# Patient Record
Sex: Female | Born: 1950
Health system: Southern US, Community
[De-identification: ages and names within clinical notes are randomized; demographics above are authoritative.]

## PROBLEM LIST (undated history)

## (undated) DIAGNOSIS — K219 Gastro-esophageal reflux disease without esophagitis: Secondary | ICD-10-CM

## (undated) DIAGNOSIS — T4145XA Adverse effect of unspecified anesthetic, initial encounter: Secondary | ICD-10-CM

## (undated) DIAGNOSIS — N183 Chronic kidney disease, stage 3 unspecified: Secondary | ICD-10-CM

## (undated) DIAGNOSIS — T8859XA Other complications of anesthesia, initial encounter: Secondary | ICD-10-CM

## (undated) DIAGNOSIS — M810 Age-related osteoporosis without current pathological fracture: Secondary | ICD-10-CM

## (undated) DIAGNOSIS — N3281 Overactive bladder: Secondary | ICD-10-CM

## (undated) DIAGNOSIS — E785 Hyperlipidemia, unspecified: Secondary | ICD-10-CM

## (undated) DIAGNOSIS — E119 Type 2 diabetes mellitus without complications: Secondary | ICD-10-CM

## (undated) DIAGNOSIS — Z85828 Personal history of other malignant neoplasm of skin: Secondary | ICD-10-CM

## (undated) DIAGNOSIS — M719 Bursopathy, unspecified: Secondary | ICD-10-CM

## (undated) DIAGNOSIS — E663 Overweight: Secondary | ICD-10-CM

## (undated) DIAGNOSIS — M1990 Unspecified osteoarthritis, unspecified site: Secondary | ICD-10-CM

## (undated) DIAGNOSIS — I10 Essential (primary) hypertension: Secondary | ICD-10-CM

## (undated) DIAGNOSIS — G4730 Sleep apnea, unspecified: Secondary | ICD-10-CM

## (undated) DIAGNOSIS — A0472 Enterocolitis due to Clostridium difficile, not specified as recurrent: Secondary | ICD-10-CM

## (undated) DIAGNOSIS — R600 Localized edema: Secondary | ICD-10-CM

## (undated) HISTORY — PX: UTERINE FIBROID SURGERY: SHX826

## (undated) HISTORY — PX: CATARACT EXTRACTION: SUR2

## (undated) HISTORY — DX: Enterocolitis due to Clostridium difficile, not specified as recurrent: A04.72

## (undated) HISTORY — DX: Sleep apnea, unspecified: G47.30

## (undated) HISTORY — DX: Overweight: E66.3

## (undated) HISTORY — PX: EYE SURGERY: SHX253

## (undated) HISTORY — DX: Localized edema: R60.0

## (undated) HISTORY — DX: Chronic kidney disease, stage 3 unspecified: N18.30

## (undated) HISTORY — DX: Type 2 diabetes mellitus without complications: E11.9

## (undated) HISTORY — PX: OTHER SURGICAL HISTORY: SHX169

---

## 1999-12-24 ENCOUNTER — Other Ambulatory Visit: Admission: RE | Admit: 1999-12-24 | Discharge: 1999-12-24 | Payer: Self-pay | Admitting: Obstetrics and Gynecology

## 2000-02-28 ENCOUNTER — Encounter: Payer: Self-pay | Admitting: Obstetrics and Gynecology

## 2000-02-28 ENCOUNTER — Encounter: Admission: RE | Admit: 2000-02-28 | Discharge: 2000-02-28 | Payer: Self-pay | Admitting: Internal Medicine

## 2001-01-12 ENCOUNTER — Other Ambulatory Visit: Admission: RE | Admit: 2001-01-12 | Discharge: 2001-01-12 | Payer: Self-pay | Admitting: Obstetrics and Gynecology

## 2001-02-28 ENCOUNTER — Encounter: Admission: RE | Admit: 2001-02-28 | Discharge: 2001-02-28 | Payer: Self-pay | Admitting: Obstetrics and Gynecology

## 2001-02-28 ENCOUNTER — Encounter: Payer: Self-pay | Admitting: Obstetrics and Gynecology

## 2002-03-06 ENCOUNTER — Encounter: Payer: Self-pay | Admitting: Obstetrics and Gynecology

## 2002-03-06 ENCOUNTER — Encounter: Admission: RE | Admit: 2002-03-06 | Discharge: 2002-03-06 | Payer: Self-pay | Admitting: Obstetrics and Gynecology

## 2003-03-03 ENCOUNTER — Encounter: Payer: Self-pay | Admitting: Obstetrics and Gynecology

## 2003-03-03 ENCOUNTER — Ambulatory Visit (HOSPITAL_COMMUNITY): Admission: RE | Admit: 2003-03-03 | Discharge: 2003-03-03 | Payer: Self-pay | Admitting: Obstetrics and Gynecology

## 2003-03-10 ENCOUNTER — Encounter: Admission: RE | Admit: 2003-03-10 | Discharge: 2003-03-10 | Payer: Self-pay | Admitting: Family Medicine

## 2003-03-10 ENCOUNTER — Encounter: Payer: Self-pay | Admitting: Family Medicine

## 2003-03-21 ENCOUNTER — Encounter: Payer: Self-pay | Admitting: Obstetrics and Gynecology

## 2003-03-27 ENCOUNTER — Inpatient Hospital Stay (HOSPITAL_COMMUNITY): Admission: RE | Admit: 2003-03-27 | Discharge: 2003-03-30 | Payer: Self-pay | Admitting: Obstetrics and Gynecology

## 2003-05-12 ENCOUNTER — Encounter: Admission: RE | Admit: 2003-05-12 | Discharge: 2003-05-12 | Payer: Self-pay | Admitting: Family Medicine

## 2003-05-12 ENCOUNTER — Encounter: Payer: Self-pay | Admitting: Obstetrics and Gynecology

## 2003-06-18 ENCOUNTER — Encounter: Admission: RE | Admit: 2003-06-18 | Discharge: 2003-06-18 | Payer: Self-pay | Admitting: Obstetrics and Gynecology

## 2003-08-02 HISTORY — PX: ABDOMINAL HYSTERECTOMY: SHX81

## 2004-03-11 ENCOUNTER — Encounter: Admission: RE | Admit: 2004-03-11 | Discharge: 2004-03-11 | Payer: Self-pay | Admitting: Internal Medicine

## 2004-10-16 ENCOUNTER — Emergency Department (HOSPITAL_COMMUNITY): Admission: EM | Admit: 2004-10-16 | Discharge: 2004-10-16 | Payer: Self-pay | Admitting: Emergency Medicine

## 2005-03-14 ENCOUNTER — Encounter: Admission: RE | Admit: 2005-03-14 | Discharge: 2005-03-14 | Payer: Self-pay | Admitting: Obstetrics and Gynecology

## 2005-03-16 ENCOUNTER — Encounter: Admission: RE | Admit: 2005-03-16 | Discharge: 2005-03-16 | Payer: Self-pay | Admitting: Family Medicine

## 2005-09-13 ENCOUNTER — Ambulatory Visit (HOSPITAL_BASED_OUTPATIENT_CLINIC_OR_DEPARTMENT_OTHER): Admission: RE | Admit: 2005-09-13 | Discharge: 2005-09-13 | Payer: Self-pay | Admitting: Orthopedic Surgery

## 2005-09-13 ENCOUNTER — Encounter (INDEPENDENT_AMBULATORY_CARE_PROVIDER_SITE_OTHER): Payer: Self-pay | Admitting: *Deleted

## 2005-11-18 ENCOUNTER — Encounter: Admission: RE | Admit: 2005-11-18 | Discharge: 2005-11-18 | Payer: Self-pay | Admitting: Family Medicine

## 2006-03-15 ENCOUNTER — Encounter: Admission: RE | Admit: 2006-03-15 | Discharge: 2006-03-15 | Payer: Self-pay | Admitting: Obstetrics and Gynecology

## 2007-03-19 ENCOUNTER — Encounter: Admission: RE | Admit: 2007-03-19 | Discharge: 2007-03-19 | Payer: Self-pay | Admitting: Obstetrics and Gynecology

## 2008-01-23 ENCOUNTER — Encounter (INDEPENDENT_AMBULATORY_CARE_PROVIDER_SITE_OTHER): Payer: Self-pay | Admitting: Urology

## 2008-01-23 ENCOUNTER — Ambulatory Visit (HOSPITAL_BASED_OUTPATIENT_CLINIC_OR_DEPARTMENT_OTHER): Admission: RE | Admit: 2008-01-23 | Discharge: 2008-01-23 | Payer: Self-pay | Admitting: Urology

## 2008-03-19 ENCOUNTER — Encounter: Admission: RE | Admit: 2008-03-19 | Discharge: 2008-03-19 | Payer: Self-pay | Admitting: Obstetrics and Gynecology

## 2009-03-23 ENCOUNTER — Encounter: Admission: RE | Admit: 2009-03-23 | Discharge: 2009-03-23 | Payer: Self-pay | Admitting: Obstetrics and Gynecology

## 2010-03-26 ENCOUNTER — Encounter: Admission: RE | Admit: 2010-03-26 | Discharge: 2010-03-26 | Payer: Self-pay | Admitting: Obstetrics and Gynecology

## 2010-08-21 ENCOUNTER — Encounter: Payer: Self-pay | Admitting: Obstetrics and Gynecology

## 2010-12-14 NOTE — Op Note (Signed)
Pamela Love, Pamela Love                 ACCOUNT NO.:  [id number]   MEDICAL RECORD NO.:  [id number]         PATIENT TYPE:  HAMB   LOCATION:                               FACILITY:  [facility]   PHYSICIAN:  Maretta Bees. Vonita Moss, M.D.DATE OF BIRTH:  1951/03/12   DATE OF PROCEDURE:  01/23/2008  DATE OF DISCHARGE:                               OPERATIVE REPORT   PREOPERATIVE DIAGNOSIS:  Rule out interstitial cystitis.   POSTOPERATIVE DIAGNOSIS:  Rule out interstitial cystitis.   PROCEDURES:  1. Cystoscopy.  2. Hydraulic overdistention of bladder.  3. Cold cup bladder biopsy.   SURGEON:  Maretta Bees. Vonita Moss, M.D.   ANESTHESIA:  General.   INDICATIONS:  This 60 year old lady has had problems with bladder  pressure and discomfort and some pain with filling of the bladder.  Pelvic pain and symptom score was 17 which gave her about a 75% chance  of having interstitial cystitis.  She was brought to the OR, today, for  further evaluation; having been advised about the risk of infection,  hemorrhage of bladder rupture.   DESCRIPTION OF PROCEDURE:  The patient was brought to the operating  room, placed in lithotomy position.  External genitalia were prepped and  draped in the usual fashion.  She was cystoscoped and the bladder was  totally normal.  She held up to 700 mL of irrigating fluid, and looking  back in she had scattered submucosal petechiae and minimal hemorrhage.  Three hemorrhagic areas on the bladder base were biopsied with the cold  cup bladder biopsy forceps, and then the biopsy sites were coagulated  with the Bugbee electrode.  There was no significant blood loss.  There  was good hemostasis, and at the end of the case of bladder was emptied;  and the patient sent to the recovery room in good the recovery room in  good condition.      Maretta Bees. Vonita Moss, M.D.  Electronically Signed     LJP/MEDQ  D:  01/23/2008  T:  01/23/2008  Job:  [id number]

## 2010-12-17 NOTE — Op Note (Signed)
NAME:  Pamela Love, Pamela Love                 ACCOUNT NO.:  [id number]   MEDICAL RECORD NO.:  [id number]          PATIENT TYPE:  AMB   LOCATION:  [facility]                          FACILITY:  [facility]   PHYSICIAN:  Katy Fitch. Sypher, M.D. DATE OF BIRTH:  31-Mar-1951   DATE OF PROCEDURE:  09/13/2005  DATE OF DISCHARGE:                                 OPERATIVE REPORT   PREOPERATIVE DIAGNOSIS:  Enlarging painful mass, dorsal aspect of left index  finger proximal interphalangeal joint, rule out extensor tendon ganglion  versus myxoma from proximal interphalangeal joint.   POSTOPERATIVE DIAGNOSIS:  Trans-extensor ganglion cyst emanating from ulnar  dorsal aspect of left index finger proximal interphalangeal joint.   OPERATION:  1.  Excisional biopsy of a ganglion cyst/myxoma from the dorsoulnar aspect      of the left index finger proximal interphalangeal joint.  2.  Arthrotomy of proximal interphalangeal joint for synovectomy and      debridement, irrigation and removal of root of extensor tendon      ganglion.   OPERATING SURGEON:  Katy Fitch. Sypher, M.D.   ASSISTANT:  Pamela Love, P.A.-C   ANESTHESIA:  2% lidocaine metacarpal head level block, left index finger.  There was no sedation provided; this procedure was performed in the minor  operating suite.   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  Arvilla Love.   She had pain and had a mass measuring approximately 6 mm in length and 5 mm  in width.  This was quite prominent with finger flexion.   X-ray of her finger demonstrated degenerative change at the DIP and PIP  joints of a minor degree.  She appeared to have an extensor ganglion/myxoma.   We recommended excisional biopsy at her convenience.   She requested proceeding with removal at this time.   Preoperatively, she was advised that these cysts can form when there is  underlying osteoarthritis.  We cannot guarantee that he will remain gone  after debridement, although our usual experiences is successful with  thorough debridement of the PIP joint.   After informed consent, she is brought to the operating room at this time.   PROCEDURE:  Pamela Love is brought to the minor operating room and placed in  supine position upon the operating table.  Following a Betadine prep, a 2%  lidocaine block was placed at the metacarpal head level to obtain a digital  block.  After 10 minutes, anesthesia was satisfactory.  The left hand and  arm were prepped with Betadine soap and solution and sterilely draped.   The left index finger was exsanguinated with a gauze wrap and a half-inch  Penrose drain used at the base of the finger as a digital tourniquet.   The procedure commenced with a curvilinear incision exposing the mass.  Subcutaneous tissues were carefully dividing, taking care to spare the  dorsal veins.  A 6 x 5-mm cyst  was identified exiting between the ulnar-  lateral band and the central slip.  This was circumferentially dissected and  followed through the extensor mechanism and an arthrotomy of the PIP joint  accomplished.  There was considerable synovitis in the PIP joint.  I could  not identify a discrete osteophyte adjacent to the origin of the cyst.  The  DIP joint was thoroughly debrided with a micro-rongeur of all visible  synovium followed by thorough irrigation with a dental gauge 18 needle.   The wound was then irrigated and bleeding points electrocauterized, followed  by repair of the skin with mattress suture of 5-0 nylon.   There were no apparent complications.   Pamela Love tolerated surgery and anesthesia well.  She was transferred to the  recovery room with stable signs.   She will be discharged home with a prescription for Darvocet-N 100 one p.o.  q.4-6 h. p.r.n.  pain, 16 tablets without refill.   She will return to see [place] in followup and 5-7 days for a dressing change.  We anticipate suture removal in approximately 10 days.      Katy Fitch Sypher, M.D.  Electronically Signed     RVS/MEDQ  D:  09/13/2005  T:  09/14/2005  Job:  [id number]

## 2011-02-18 ENCOUNTER — Other Ambulatory Visit: Payer: Self-pay | Admitting: Obstetrics and Gynecology

## 2011-02-18 DIAGNOSIS — Z1231 Encounter for screening mammogram for malignant neoplasm of breast: Secondary | ICD-10-CM

## 2011-03-30 ENCOUNTER — Ambulatory Visit
Admission: RE | Admit: 2011-03-30 | Discharge: 2011-03-30 | Disposition: A | Discharge status: Home / Self Care | Payer: BC Managed Care – PPO | Source: Ambulatory Visit | Attending: Obstetrics and Gynecology | Admitting: Obstetrics and Gynecology

## 2011-03-30 DIAGNOSIS — Z1231 Encounter for screening mammogram for malignant neoplasm of breast: Secondary | ICD-10-CM

## 2011-04-28 LAB — POCT I-STAT 4, (NA,K, GLUC, HGB,HCT)
Glucose, Bld: 110 — ABNORMAL HIGH
Operator id: 268271
Potassium: 3.9
Sodium: 139

## 2012-03-19 ENCOUNTER — Other Ambulatory Visit: Payer: Self-pay | Admitting: Obstetrics and Gynecology

## 2012-03-19 DIAGNOSIS — Z1231 Encounter for screening mammogram for malignant neoplasm of breast: Secondary | ICD-10-CM

## 2012-04-04 ENCOUNTER — Ambulatory Visit
Admission: RE | Admit: 2012-04-04 | Discharge: 2012-04-04 | Disposition: A | Discharge status: Home / Self Care | Payer: BC Managed Care – PPO | Source: Ambulatory Visit | Attending: Obstetrics and Gynecology | Admitting: Obstetrics and Gynecology

## 2012-04-04 DIAGNOSIS — Z1231 Encounter for screening mammogram for malignant neoplasm of breast: Secondary | ICD-10-CM

## 2012-04-11 ENCOUNTER — Other Ambulatory Visit: Payer: Self-pay | Admitting: Orthopedic Surgery

## 2012-04-11 DIAGNOSIS — M25561 Pain in right knee: Secondary | ICD-10-CM

## 2012-04-11 DIAGNOSIS — R531 Weakness: Secondary | ICD-10-CM

## 2012-04-15 ENCOUNTER — Ambulatory Visit
Admission: RE | Admit: 2012-04-15 | Discharge: 2012-04-15 | Disposition: A | Discharge status: Home / Self Care | Payer: BC Managed Care – PPO | Source: Ambulatory Visit | Attending: Orthopedic Surgery | Admitting: Orthopedic Surgery

## 2012-04-15 DIAGNOSIS — M25561 Pain in right knee: Secondary | ICD-10-CM

## 2012-04-15 DIAGNOSIS — R531 Weakness: Secondary | ICD-10-CM

## 2012-08-22 ENCOUNTER — Other Ambulatory Visit: Payer: Self-pay | Admitting: Dermatology

## 2013-03-25 ENCOUNTER — Other Ambulatory Visit: Payer: Self-pay

## 2013-03-25 DIAGNOSIS — Z1231 Encounter for screening mammogram for malignant neoplasm of breast: Secondary | ICD-10-CM

## 2013-04-24 ENCOUNTER — Ambulatory Visit
Admission: RE | Admit: 2013-04-24 | Discharge: 2013-04-24 | Disposition: A | Discharge status: Home / Self Care | Payer: BC Managed Care – PPO | Source: Ambulatory Visit

## 2013-04-24 DIAGNOSIS — Z1231 Encounter for screening mammogram for malignant neoplasm of breast: Secondary | ICD-10-CM

## 2014-03-27 ENCOUNTER — Other Ambulatory Visit: Payer: Self-pay

## 2014-03-27 DIAGNOSIS — Z1231 Encounter for screening mammogram for malignant neoplasm of breast: Secondary | ICD-10-CM

## 2014-04-25 ENCOUNTER — Ambulatory Visit: Payer: BC Managed Care – PPO

## 2014-04-29 ENCOUNTER — Ambulatory Visit
Admission: RE | Admit: 2014-04-29 | Discharge: 2014-04-29 | Disposition: A | Discharge status: Home / Self Care | Payer: 59 | Source: Ambulatory Visit

## 2014-04-29 DIAGNOSIS — Z1231 Encounter for screening mammogram for malignant neoplasm of breast: Secondary | ICD-10-CM

## 2014-09-24 ENCOUNTER — Other Ambulatory Visit: Payer: Self-pay | Admitting: Orthopedic Surgery

## 2014-09-24 NOTE — H&P (Signed)
TOTAL KNEE ADMISSION H&P  Patient is being admitted for left total knee arthroplasty.  Subjective:  Chief Complaint:left knee pain.  HPI: Pamela Love, 64 y.o. female, has a history of pain and functional disability in the left knee due to arthritis and has failed non-surgical conservative treatments for greater than 12 weeks to includeNSAID's and/or analgesics, corticosteriod injections, viscosupplementation injections, weight reduction as appropriate and activity modification.  Onset of symptoms was gradual, starting 4 years ago with gradually worsening course since that time. The patient noted no past surgery on the left knee(s).  Patient currently rates pain in the left knee(s) at 7 out of 10 with activity. Patient has night pain, worsening of pain with activity and weight bearing, pain that interferes with activities of daily living, pain with passive range of motion and joint swelling.  Patient has evidence of subchondral sclerosis and joint space narrowing by imaging studies. This patient has had Osteoarthritis. There is no active infection.  There are no active problems to display for this patient.  No past medical history on file.  No past surgical history on file.  No prescriptions prior to admission   Allergies not on file  History  Substance Use Topics  . Smoking status: Not on file  . Smokeless tobacco: Not on file  . Alcohol Use: Not on file    No family history on file.   Review of Systems  Constitutional: Negative.   HENT: Negative.   Eyes: Negative.   Respiratory: Negative.   Cardiovascular: Negative.   Gastrointestinal: Negative.   Genitourinary: Negative.   Musculoskeletal: Positive for joint pain.  Skin: Negative.   Neurological: Negative.   Endo/Heme/Allergies: Negative.   Psychiatric/Behavioral: Negative.     Objective:  Physical Exam  Constitutional: She is oriented to person, place, and time. She appears well-developed.  HENT:  Head: Atraumatic.   Eyes: EOM are normal.  Neck: Normal range of motion.  Cardiovascular: Normal rate, normal heart sounds and intact distal pulses.   Respiratory: Effort normal and breath sounds normal.  GI: Soft.  Genitourinary:  Deferred  Musculoskeletal:  Pain with stand and ROM of bilateral knees. R&L lower extremities n/v intact.   Neurological: She is alert and oriented to person, place, and time. She has normal reflexes.  Skin: Skin is warm and dry.  Psychiatric: Her behavior is normal.    Vital signs in last 24 hours:    Labs:   There is no height or weight on file to calculate BMI.   Imaging Review Plain radiographs demonstrate moderate degenerative joint disease of the left knee(s). The overall alignment ismild valgus. The bone quality appears to be good for age and reported activity level.  Assessment/Plan:  End stage arthritis, left knee   The patient history, physical examination, clinical judgment of the provider and imaging studies are consistent with end stage degenerative joint disease of the left knee(s) and total knee arthroplasty is deemed medically necessary. The treatment options including medical management, injection therapy arthroscopy and arthroplasty were discussed at length. The risks and benefits of total knee arthroplasty were presented and reviewed. The risks due to aseptic loosening, infection, stiffness, patella tracking problems, thromboembolic complications and other imponderables were discussed. The patient acknowledged the explanation, agreed to proceed with the plan and consent was signed. Patient is being admitted for inpatient treatment for surgery, pain control, PT, OT, prophylactic antibiotics, VTE prophylaxis, progressive ambulation and ADL's and discharge planning. The patient is planning to be discharged home with home health  services.  Contraindications and adverse affects of Tranexamic acid discussed in detail. Patient denies any of these at this time and  understands the risks and benefits.

## 2014-10-09 NOTE — Patient Instructions (Addendum)
Gweneth DimitriLynda D Slivinski  10/09/2014   Your procedure is scheduled on: 10/16/14   [facility] and follow signs to               Short Stay Center at 12:00 PM.   Call this number if you have problems the morning of surgery [phone]   Remember:  Do not eat food  :After Midnight.             MAY HAVE CLEAR LIQUIDS UNTIL 8:00 AM    CLEAR LIQUID DIET   Foods Allowed                                                                     Foods Excluded  Coffee and tea, regular and decaf                             liquids that you cannot  Plain Jell-O in any flavor                                             see through such as: Fruit ices (not with fruit pulp)                                     milk, soups, orange juice  Iced Popsicles                                                All solid food Carbonated beverages, regular and diet                                    Cranberry, grape and apple juices Sports drinks like Gatorade Lightly seasoned clear broth or consume(fat free) Sugar, honey syrup  _____________________________________________________________________    Take these medicines the morning of surgery with A SIP OF WATER: PRILOSEC / CRESTOR / TROSPIUM                               You may not have any metal on your body including hair pins and              piercings  Do not wear jewelry, make-up, lotions, powders or perfumes.             Do not wear nail polish.  Do not shave  48 hours prior to surgery.              Men may shave face and neck.   Do not bring valuables to the hospital. [facility] IS NOT             RESPONSIBLE   FOR VALUABLES.  Contacts, dentures or bridgework may not be worn into surgery.  Leave suitcase in the car. After surgery it may be brought to your room.     Patients discharged the day of surgery will not be allowed to drive home.  Name and phone number of your driver:  Special Instructions:  N/A              Please read over the following fact sheets you were given: _____________________________________________________________________                                                     [place]  Before surgery, you can play an important role.  Because skin is not sterile, your skin needs to be as free of germs as possible.  You can reduce the number of germs on your skin by washing with CHG (chlorahexidine gluconate) soap before surgery.  CHG is an antiseptic cleaner which kills germs and bonds with the skin to continue killing germs even after washing. Please DO NOT use if you have an allergy to CHG or antibacterial soaps.  If your skin becomes reddened/irritated stop using the CHG and inform your nurse when you arrive at Short Stay. Do not shave (including legs and underarms) for at least 48 hours prior to the first CHG shower.  You may shave your face. Please follow these instructions carefully:   1.  Shower with CHG Soap the night before surgery and the  morning of Surgery.   2.  If you choose to wash your hair, wash your hair first as usual with your  normal  Shampoo.   3.  After you shampoo, rinse your hair and body thoroughly to remove the  shampoo.                                         4.  Use CHG as you would any other liquid soap.  You can apply chg directly  to the skin and wash . Gently wash with scrungie or clean wascloth    5.  Apply the CHG Soap to your body ONLY FROM THE NECK DOWN.   Do not use on open                           Wound or open sores. Avoid contact with eyes, ears mouth and genitals (private parts).                        Genitals (private parts) with your normal soap.              6.  Wash thoroughly, paying special attention to the area where your surgery  will be performed.   7.  Thoroughly rinse your body with warm water from the neck down.   8.  DO NOT shower/wash with your normal soap after using and rinsing  off  the CHG Soap .                9.  Pat yourself dry with a clean towel.             10.  Wear clean pajamas.  11.  Place clean sheets on your bed the night of your first shower and do not  sleep with pets.  Day of Surgery : Do not apply any lotions/deodorants the morning of surgery.  Please wear clean clothes to the hospital/surgery center.  FAILURE TO FOLLOW THESE INSTRUCTIONS MAY RESULT IN THE CANCELLATION OF YOUR SURGERY    PATIENT SIGNATURE_________________________________  ______________________________________________________________________     Adam [place]ix  An incentive spirometer is a tool that can help keep your lungs clear and active. This tool measures how well you are filling your lungs with each breath. Taking long deep breaths may help reverse or decrease the chance of developing breathing (pulmonary) problems (especially infection) following:  A long period of time when you are unable to move or be active. BEFORE THE PROCEDURE   If the spirometer includes an indicator to show your best effort, your nurse or respiratory therapist will set it to a desired goal.  If possible, sit up straight or lean slightly forward. Try not to slouch.  Hold the incentive spirometer in an upright position. INSTRUCTIONS FOR USE  1. Sit on the edge of your bed if possible, or sit up as far as you can in bed or on a chair. 2. Hold the incentive spirometer in an upright position. 3. Breathe out normally. 4. Place the mouthpiece in your mouth and seal your lips tightly around it. 5. Breathe in slowly and as deeply as possible, raising the piston or the ball toward the top of the column. 6. Hold your breath for 3-5 seconds or for as long as possible. Allow the piston or ball to fall to the bottom of the column. 7. Remove the mouthpiece from your mouth and breathe out normally. 8. Rest for a few seconds and repeat Steps 1 through 7 at least 10 times every 1-2  hours when you are awake. Take your time and take a few normal breaths between deep breaths. 9. The spirometer may include an indicator to show your best effort. Use the indicator as a goal to work toward during each repetition. 10. After each set of 10 deep breaths, practice coughing to be sure your lungs are clear. If you have an incision (the cut made at the time of surgery), support your incision when coughing by placing a pillow or rolled up towels firmly against it. Once you are able to get out of bed, walk around indoors and cough well. You may stop using the incentive spirometer when instructed by your caregiver.  RISKS AND COMPLICATIONS  Take your time so you do not get dizzy or light-headed.  If you are in pain, you may need to take or ask for pain medication before doing incentive spirometry. It is harder to take a deep breath if you are having pain. AFTER USE  Rest and breathe slowly and easily.  It can be helpful to keep track of a log of your progress. Your caregiver can provide you with a simple table to help with this. If you are using the spirometer at home, follow these instructions: [place] IF:   You are having difficultly using the spirometer.  You have trouble using the spirometer as often as instructed.  Your pain medication is not giving enough relief while using the spirometer.  You develop fever of 100.5 F (38.1 C) or higher. SEEK IMMEDIATE MEDICAL CARE IF:   You cough up bloody sputum that had not been present before.  You develop fever of 102 F (  38.9 C) or greater.  You develop worsening pain at or near the incision site. MAKE SURE YOU:   Understand these instructions.  Will watch your condition.  Will get help right away if you are not doing well or get worse. Document Released: 11/28/2006 Document Revised: 10/10/2011 Document Reviewed: 01/29/2007 [facility] Patient Information 2014 [place],  [place].   ________________________________________________________________________

## 2014-10-10 ENCOUNTER — Other Ambulatory Visit (HOSPITAL_COMMUNITY): Payer: Self-pay | Admitting: *Deleted

## 2014-10-10 ENCOUNTER — Encounter (HOSPITAL_COMMUNITY)
Admission: RE | Admit: 2014-10-10 | Discharge: 2014-10-10 | Disposition: A | Discharge status: Home / Self Care | Payer: Medicare Other | Source: Ambulatory Visit | Attending: Specialist | Admitting: Specialist

## 2014-10-10 ENCOUNTER — Encounter (HOSPITAL_COMMUNITY): Payer: Self-pay

## 2014-10-10 DIAGNOSIS — M1712 Unilateral primary osteoarthritis, left knee: Secondary | ICD-10-CM | POA: Diagnosis not present

## 2014-10-10 DIAGNOSIS — Z01812 Encounter for preprocedural laboratory examination: Secondary | ICD-10-CM | POA: Insufficient documentation

## 2014-10-10 DIAGNOSIS — Z01810 Encounter for preprocedural cardiovascular examination: Secondary | ICD-10-CM | POA: Diagnosis not present

## 2014-10-10 HISTORY — DX: Overactive bladder: N32.81

## 2014-10-10 HISTORY — DX: Other complications of anesthesia, initial encounter: T88.59XA

## 2014-10-10 HISTORY — DX: Age-related osteoporosis without current pathological fracture: M81.0

## 2014-10-10 HISTORY — DX: Bursopathy, unspecified: M71.9

## 2014-10-10 HISTORY — DX: Adverse effect of unspecified anesthetic, initial encounter: T41.45XA

## 2014-10-10 HISTORY — DX: Hyperlipidemia, unspecified: E78.5

## 2014-10-10 HISTORY — DX: Essential (primary) hypertension: I10

## 2014-10-10 HISTORY — DX: Unspecified osteoarthritis, unspecified site: M19.90

## 2014-10-10 HISTORY — DX: Personal history of other malignant neoplasm of skin: Z85.828

## 2014-10-10 HISTORY — DX: Type 2 diabetes mellitus without complications: E11.9

## 2014-10-10 HISTORY — DX: Gastro-esophageal reflux disease without esophagitis: K21.9

## 2014-10-10 LAB — URINALYSIS, ROUTINE W REFLEX MICROSCOPIC
BILIRUBIN URINE: NEGATIVE
GLUCOSE, UA: 100 mg/dL — AB
HGB URINE DIPSTICK: NEGATIVE
Ketones, ur: NEGATIVE mg/dL
Leukocytes, UA: NEGATIVE
Nitrite: NEGATIVE
PROTEIN: NEGATIVE mg/dL
Specific Gravity, Urine: 1.016 (ref 1.005–1.030)
Urobilinogen, UA: 0.2 mg/dL (ref 0.0–1.0)
pH: 7 (ref 5.0–8.0)

## 2014-10-10 LAB — BASIC METABOLIC PANEL
Anion gap: 11 (ref 5–15)
BUN: 32 mg/dL — ABNORMAL HIGH (ref 6–23)
CO2: 26 mmol/L (ref 19–32)
Calcium: 10.3 mg/dL (ref 8.4–10.5)
Chloride: 103 mmol/L (ref 96–112)
Creatinine, Ser: 1.07 mg/dL (ref 0.50–1.10)
GFR calc Af Amer: 61 mL/min — ABNORMAL LOW (ref >90)
GFR calc non Af Amer: 53 mL/min — ABNORMAL LOW (ref >90)
Glucose, Bld: 173 mg/dL — ABNORMAL HIGH (ref 70–99)
Potassium: 3.4 mmol/L — ABNORMAL LOW (ref 3.5–5.1)
Sodium: 140 mmol/L (ref 135–145)

## 2014-10-10 LAB — APTT: aPTT: 29 seconds (ref 24–37)

## 2014-10-10 LAB — CBC
HCT: 41 % (ref 36.0–46.0)
Hemoglobin: 13.8 g/dL (ref 12.0–15.0)
MCH: 29.6 pg (ref 26.0–34.0)
MCHC: 33.7 g/dL (ref 30.0–36.0)
MCV: 87.8 fL (ref 78.0–100.0)
Platelets: 278 10*3/uL (ref 150–400)
RBC: 4.67 MIL/uL (ref 3.87–5.11)
RDW: 12.5 % (ref 11.5–15.5)
WBC: 7.9 10*3/uL (ref 4.0–10.5)

## 2014-10-10 LAB — SURGICAL PCR SCREEN
MRSA, PCR: NEGATIVE
Staphylococcus aureus: POSITIVE — AB

## 2014-10-10 LAB — PROTIME-INR
INR: 1 (ref 0.00–1.49)
Prothrombin Time: 13.3 seconds (ref 11.6–15.2)

## 2014-10-10 LAB — ABO/RH: ABO/RH(D): B POS

## 2014-10-13 NOTE — Progress Notes (Signed)
MRSA screening positive for Staph. Mupriocin ointment called into [facility] on [address] and patient called about treatment plan.  No questions or concerns. Faxed results to Dr. Thomasena Edisollins via [facility]

## 2014-10-16 ENCOUNTER — Inpatient Hospital Stay (HOSPITAL_COMMUNITY): Admission: RE | Admit: 2014-10-16 | Payer: Medicare Other | Source: Ambulatory Visit | Admitting: Specialist

## 2014-10-16 ENCOUNTER — Encounter (HOSPITAL_COMMUNITY): Admission: RE | Payer: Self-pay | Source: Ambulatory Visit

## 2014-10-16 LAB — TYPE AND SCREEN
ABO/RH(D): B POS
Antibody Screen: NEGATIVE

## 2014-10-16 SURGERY — ARTHROPLASTY, KNEE, TOTAL
Anesthesia: Spinal | Site: Knee | Laterality: Left

## 2014-10-20 ENCOUNTER — Encounter: Payer: Self-pay | Admitting: Endocrinology

## 2014-10-20 ENCOUNTER — Ambulatory Visit (INDEPENDENT_AMBULATORY_CARE_PROVIDER_SITE_OTHER): Payer: Medicare Other | Admitting: Endocrinology

## 2014-10-20 VITALS — BP 124/88 | HR 84 | Temp 97.9°F | Ht 63.0 in | Wt 202.0 lb

## 2014-10-20 DIAGNOSIS — E785 Hyperlipidemia, unspecified: Secondary | ICD-10-CM

## 2014-10-20 DIAGNOSIS — M179 Osteoarthritis of knee, unspecified: Secondary | ICD-10-CM

## 2014-10-20 DIAGNOSIS — I10 Essential (primary) hypertension: Secondary | ICD-10-CM

## 2014-10-20 DIAGNOSIS — M1710 Unilateral primary osteoarthritis, unspecified knee: Secondary | ICD-10-CM

## 2014-10-20 DIAGNOSIS — IMO0002 Reserved for concepts with insufficient information to code with codable children: Secondary | ICD-10-CM

## 2014-10-20 DIAGNOSIS — E1121 Type 2 diabetes mellitus with diabetic nephropathy: Secondary | ICD-10-CM

## 2014-10-20 MED ORDER — INSULIN GLARGINE 100 UNIT/ML SOLOSTAR PEN: 20.0000 [IU] | PEN_INJECTOR | SUBCUTANEOUS | Status: DC

## 2014-10-20 NOTE — Patient Instructions (Addendum)
good diet and exercise habits significanly improve the control of your diabetes.  please let me know if you wish to be referred to a dietician.  high blood sugar is very risky to your health.  you should see an eye doctor and dentist every year.  It is very important to get all recommended vaccinations.  controlling your blood pressure and cholesterol drastically reduces the damage diabetes does to your body.  Those who smoke should quit.  please discuss these with your doctor.  check your blood sugar twice a day.  vary the time of day when you check, between before the 3 meals, and at bedtime.  also check if you have symptoms of your blood sugar being too high or too low.  please keep a record of the readings and bring it to your next appointment here.  You can write it on any piece of paper.  please call us sooner if your blood sugar goes below 70, or if you have a lot of readings over 200.  Please change glimepiride to lantus, 20 units daily. Please call thurs morning, to tell us how the blood sugar is doing.  Please come back for a follow-up appointment in 2 weeks.  Then, we can do a blood test called "fructosamine."  This is like the a1c, but is shorter-term.   After your surgeries, we can probably go back to diabetes pills.

## 2014-10-20 NOTE — Progress Notes (Signed)
Subjective:    Patient ID: Pamela Love, female    DOB: 12/15/1950, 64 y.o.   MRN: [id number]  HPI History is from pt and husband. pt states DM was dx'ed in 2012; she has mild if any neuropathy of the lower extremities, but she has associated nephropathy; she has never been on insulin; pt says her diet is improved recently; she has never had GDM, pancreatitis, severe hypoglycemia or DKA.  a1c worsened recently, so TKR was delayed.  Pt says this worsening is due to inactivity, due to arthralgias.  She did not tolerate janumet or trulicity.  She takes only glimepiride x the past 3 weeks.  She says cbg's are in the mid-100's.   Past Medical History  Diagnosis Date  . Complication of anesthesia     "spinal did not work with second c section"  . Hypertension   . Hyperlipidemia   . Shortness of breath dyspnea     with exertion  . Arthritis   . Bursitis     hips  . Osteoporosis   . History of nonmelanoma skin cancer   . GERD (gastroesophageal reflux disease)   . History of nephritis     as child  . OAB (overactive bladder)   . Diabetes mellitus without complication     diet controlled - has Rx for Glymipride but has not started taking yet    Past Surgical History  Procedure Laterality Date  . Cesarean section      x2  . Uterine fibroid surgery    . Abdominal hysterectomy  2005    History   Social History  . Marital Status: Married    Spouse Name: N/A  . Number of Children: N/A  . Years of Education: N/A   Occupational History  . Not on file.   Social History Main Topics  . Smoking status: Never Smoker   . Smokeless tobacco: Not on file  . Alcohol Use: Yes     Comment: rare  . Drug Use: No  . Sexual Activity: Not on file   Other Topics Concern  . Not on file   Social History Narrative    Current Outpatient Prescriptions on File Prior to Visit  Medication Sig Dispense Refill  . Calcium-Vitamin D (CALTRATE 600 PLUS-VIT D PO) Take 1 tablet by mouth every other  day.    . Cholecalciferol (VITAMIN D) 2000 UNITS CAPS Take 2,000 Units by mouth daily.    . Coenzyme Q10 (COQ10) 200 MG CAPS Take 200 mg by mouth daily.    . diclofenac sodium (VOLTAREN) 1 % GEL Apply 1 application topically at bedtime. Applied knees    . DULoxetine (CYMBALTA) 20 MG capsule Take 40 mg by mouth at bedtime.     Marland Kitchen. HYDROcodone-acetaminophen (NORCO/VICODIN) 5-325 MG per tablet Take 1 tablet by mouth at bedtime.    . Misc Natural Products (GLUCOSAMINE CHONDROITIN ADV PO) Take 1 tablet by mouth 2 (two) times daily.    . Multiple Vitamin (MULTIVITAMIN WITH MINERALS) TABS tablet Take 1 tablet by mouth every other day.    . Omega-3 Fatty Acids (RA FISH OIL) 1400 MG CPDR Take 1,400 mg by mouth 2 (two) times daily.    Marland Kitchen. omeprazole (PRILOSEC) 20 MG capsule Take 20 mg by mouth daily.    . polyethylene glycol (MIRALAX / GLYCOLAX) packet Take 17 g by mouth daily as needed for mild constipation.    . rosuvastatin (CRESTOR) 10 MG tablet Take 10 mg by mouth daily.    .Marland Kitchen  trospium (SANCTURA) 20 MG tablet Take 20 mg by mouth 2 (two) times daily.  3  . valsartan-hydrochlorothiazide (DIOVAN-HCT) 320-25 MG per tablet Take 1 tablet by mouth daily.     No current facility-administered medications on file prior to visit.    Allergies  Allergen Reactions  . Colchicine     Stomach cramps  . Diclofenac     Oral causes stomach cramps  . Janumet [Sitagliptin-Metformin Hcl] Nausea And Vomiting    Lightheaded   . Septra [Sulfamethoxazole-Trimethoprim] Itching  . Trulicity [Dulaglutide] Diarrhea and Nausea And Vomiting  . Zetia [Ezetimibe]     Muscle pain  . Imipramine Rash  . Tape Rash    Paper tape ok to use     Family History  Problem Relation Age of Onset  . Diabetes Maternal Grandmother   . Diabetes Maternal Grandfather     BP 124/88 mmHg  Pulse 84  Temp(Src) 97.9 F (36.6 C)  Ht  (1.6 m)  Wt 202 lb (91.627 kg)  BMI 35.79 kg/m2   Review of Systems denies fever, blurry vision,  headache, chest pain, n/v, urinary frequency, muscle cramps, excessive diaphoresis, depression, cold intolerance, rhinorrhea, and easy bruising.  She has doe.     Objective:   Physical Exam VS: see vs Love GEN: no distress.  Morbid obesity. HEAD: head: no deformity eyes: no periorbital swelling, no proptosis external nose and ears are normal mouth: no lesion seen NECK: supple, thyroid is not enlarged CHEST WALL: no deformity LUNGS:  Clear to auscultation CV: reg rate and rhythm, no murmur ABD: abdomen is soft, nontender.  no hepatosplenomegaly.  not distended.  no hernia MUSCULOSKELETAL: muscle bulk and strength are grossly normal.  no obvious joint swelling.  gait is steady with a cane EXTEMITIES: no deformity.  no ulcer on the feet.  feet are of normal color and temp.  no edema PULSES: dorsalis pedis intact bilat.  no carotid bruit NEURO:  cn 2-12 grossly intact.   readily moves all 4's.  sensation is intact to touch on the feet SKIN:  Normal texture and temperature.  No rash or suspicious lesion is visible.   NODES:  None palpable at the neck.  PSYCH: alert, well-oriented.  Does not appear anxious nor depressed.   outside test results are reviewed: A1c=9.0%  i personally reviewed electrocardiogram tracing: Possible Left atrial enlargement.      Assessment & Plan:  DM: severe ecacerbation OA: new to me: this is limiting exercise options, which is in turn exacerbating hyperglycemia.  We agreed that the next best step is to get to surgery.   Patient is advised the following: Patient Instructions  good diet and exercise habits significanly improve the control of your diabetes.  please let me know if you wish to be referred to a dietician.  high blood sugar is very risky to your health.  you should see an eye doctor and dentist every year.  It is very important to get all recommended vaccinations.  controlling your blood pressure and cholesterol drastically reduces the damage  diabetes does to your body.  Those who smoke should quit.  please discuss these with your doctor.  check your blood sugar twice a day.  vary the time of day when you check, between before the 3 meals, and at bedtime.  also check if you have symptoms of your blood sugar being too high or too low.  please keep a record of the readings and bring it to your next appointment  here.  You can write it on any piece of paper.  please call us sooner if your blood sugar goes below 70, or if you have a lot of readings over 200.  Please change glimepiride to lantus, 20 units daily. Please call thurs morning, to tell us how the blood sugar is doing.  Please come back for a follow-up appointment in 2 weeks.  Then, we can do a blood test called "fructosamine."  This is like the a1c, but is shorter-term.   After your surgeries, we can probably go back to diabetes pills.

## 2014-10-21 DIAGNOSIS — I10 Essential (primary) hypertension: Secondary | ICD-10-CM | POA: Insufficient documentation

## 2014-10-21 DIAGNOSIS — IMO0002 Reserved for concepts with insufficient information to code with codable children: Secondary | ICD-10-CM | POA: Insufficient documentation

## 2014-10-21 DIAGNOSIS — E785 Hyperlipidemia, unspecified: Secondary | ICD-10-CM

## 2014-10-21 DIAGNOSIS — M1710 Unilateral primary osteoarthritis, unspecified knee: Secondary | ICD-10-CM | POA: Insufficient documentation

## 2014-10-21 HISTORY — DX: Essential (primary) hypertension: I10

## 2014-10-21 HISTORY — DX: Reserved for concepts with insufficient information to code with codable children: IMO0002

## 2014-10-21 HISTORY — DX: Hyperlipidemia, unspecified: E78.5

## 2014-10-23 ENCOUNTER — Telehealth: Payer: Self-pay | Admitting: Endocrinology

## 2014-10-23 NOTE — Telephone Encounter (Signed)
Pt advised of note below and voiced understanding.  

## 2014-10-23 NOTE — Telephone Encounter (Signed)
Patient called stating that she was to call in her blood sugars  3.22.16  170 before supper 160 before bed  3.23.16 177 before breakfast 128 before supper  3.24.16 177 this morning   Please advise   Thank You

## 2014-10-23 NOTE — Telephone Encounter (Signed)
See note below and please advise, Thanks! 

## 2014-10-23 NOTE — Telephone Encounter (Signed)
please call patient: Please continue the same insulin. i'll see you next time.    

## 2014-10-27 ENCOUNTER — Telehealth: Payer: Self-pay

## 2014-10-27 NOTE — Telephone Encounter (Signed)
Unable to reach pt. Will try again at a later time.  

## 2014-10-27 NOTE — Telephone Encounter (Signed)
Since her sugars are higher in the morning she can switch her Lantus to bedtime using 22 units daily

## 2014-10-27 NOTE — Telephone Encounter (Signed)
Pt advised of MD's note below and voiced understanding.  

## 2014-10-27 NOTE — Telephone Encounter (Signed)
Pt called to report blood sugar readings.   3/24: 8:30 am: 177 743 pm: 134   3/25: 1:57 pm: 152 9:55 pm: 149  3/26:  9:53 am: 184 6:32 pm: 141    3/27: 1:30 pm: 195  10:12 pm: 115  Pt is taking 20 units of Lantus at 10 am every morning. Could you please review and advised during Dr. George HughEllison's absence? Thanks!

## 2014-10-30 ENCOUNTER — Telehealth: Payer: Self-pay | Admitting: Endocrinology

## 2014-10-30 NOTE — Telephone Encounter (Signed)
See note below. Pt is currently taking 22 units of Lantus at bed time. Please advise if any adjustments should be made during Dr. George HughEllison's absence. Thanks!

## 2014-10-30 NOTE — Telephone Encounter (Signed)
Blood sugars are better, no change

## 2014-10-30 NOTE — Telephone Encounter (Signed)
Pt calling in numbers due to change in medicine and time   3/29 @ 726 pm was 117 , 3/30 @ 859am was 187, @710pm  was 114, 3/331 @ 943am was 152

## 2014-10-30 NOTE — Telephone Encounter (Signed)
Left voicemail advising of note below.  

## 2014-11-03 ENCOUNTER — Encounter: Payer: Self-pay | Admitting: Endocrinology

## 2014-11-03 ENCOUNTER — Ambulatory Visit (INDEPENDENT_AMBULATORY_CARE_PROVIDER_SITE_OTHER): Payer: Medicare Other | Admitting: Endocrinology

## 2014-11-03 VITALS — BP 122/84 | HR 106 | Temp 98.7°F | Ht 63.0 in | Wt 202.0 lb

## 2014-11-03 DIAGNOSIS — E1121 Type 2 diabetes mellitus with diabetic nephropathy: Secondary | ICD-10-CM

## 2014-11-03 MED ORDER — INSULIN GLARGINE 100 UNIT/ML SOLOSTAR PEN: 22.0000 [IU] | PEN_INJECTOR | SUBCUTANEOUS | Status: DC

## 2014-11-03 NOTE — Patient Instructions (Addendum)
blood tests are requested for you today.  We'll let you know about the results.   Based on the results, i hope you will be good to go for your surgery.   Please come back for a follow-up appointment in 3 months.

## 2014-11-03 NOTE — Progress Notes (Signed)
Subjective:    Patient ID: Pamela Love, female    DOB: 08/06/1950, 64 y.o.   MRN: [id number]  HPI  Pt returns for f/u of diabetes mellitus: DM type: Insulin-requiring type 2 Dx'ed: 2012 Complications: nephropathy Therapy: insulin since 2016 GDM: never DKA: never Severe hypoglycemia: never Pancreatitis: never Other: she wants to quickly improve control, so she can undergo TKR Interval history: she brings a record of her cbg's which i have reviewed today.  It varies from 110-160.  pt states she feels well in general, except for bilat knee pain.   Past Medical History  Diagnosis Date  . Complication of anesthesia     "spinal did not work with second c section"  . Hypertension   . Hyperlipidemia   . Shortness of breath dyspnea     with exertion  . Arthritis   . Bursitis     hips  . Osteoporosis   . History of nonmelanoma skin cancer   . GERD (gastroesophageal reflux disease)   . History of nephritis     as child  . OAB (overactive bladder)   . Diabetes mellitus without complication     diet controlled - has Rx for Glymipride but has not started taking yet    Past Surgical History  Procedure Laterality Date  . Cesarean section      x2  . Uterine fibroid surgery    . Abdominal hysterectomy  2005    History   Social History  . Marital Status: Married    Spouse Name: N/A  . Number of Children: N/A  . Years of Education: N/A   Occupational History  . Not on file.   Social History Main Topics  . Smoking status: Never Smoker   . Smokeless tobacco: Not on file  . Alcohol Use: Yes     Comment: rare  . Drug Use: No  . Sexual Activity: Not on file   Other Topics Concern  . Not on file   Social History Narrative    Current Outpatient Prescriptions on File Prior to Visit  Medication Sig Dispense Refill  . Calcium-Vitamin D (CALTRATE 600 PLUS-VIT D PO) Take 1 tablet by mouth every other day.    . Cholecalciferol (VITAMIN D) 2000 UNITS CAPS Take 2,000 Units by  mouth daily.    . Coenzyme Q10 (COQ10) 200 MG CAPS Take 200 mg by mouth daily.    . diclofenac sodium (VOLTAREN) 1 % GEL Apply 1 application topically at bedtime. Applied knees    . DULoxetine (CYMBALTA) 20 MG capsule Take 40 mg by mouth at bedtime.     Marland Kitchen. HYDROcodone-acetaminophen (NORCO/VICODIN) 5-325 MG per tablet Take 1 tablet by mouth at bedtime.    . Misc Natural Products (GLUCOSAMINE CHONDROITIN ADV PO) Take 1 tablet by mouth 2 (two) times daily.    . Multiple Vitamin (MULTIVITAMIN WITH MINERALS) TABS tablet Take 1 tablet by mouth every other day.    . Omega-3 Fatty Acids (RA FISH OIL) 1400 MG CPDR Take 1,400 mg by mouth 2 (two) times daily.    Marland Kitchen. omeprazole (PRILOSEC) 20 MG capsule Take 20 mg by mouth daily.    . polyethylene glycol (MIRALAX / GLYCOLAX) packet Take 17 g by mouth daily as needed for mild constipation.    . rosuvastatin (CRESTOR) 10 MG tablet Take 10 mg by mouth daily.    . trospium (SANCTURA) 20 MG tablet Take 20 mg by mouth 2 (two) times daily.  3  . valsartan-hydrochlorothiazide (DIOVAN-HCT) 320-25  MG per tablet Take 1 tablet by mouth daily.     No current facility-administered medications on file prior to visit.    Allergies  Allergen Reactions  . Colchicine     Stomach cramps  . Diclofenac     Oral causes stomach cramps  . Janumet [Sitagliptin-Metformin Hcl] Nausea And Vomiting    Lightheaded   . Septra [Sulfamethoxazole-Trimethoprim] Itching  . Trulicity [Dulaglutide] Diarrhea and Nausea And Vomiting  . Zetia [Ezetimibe]     Muscle pain  . Imipramine Rash  . Tape Rash    Paper tape ok to use     Family History  Problem Relation Age of Onset  . Diabetes Maternal Grandmother   . Diabetes Maternal Grandfather     BP 122/84 mmHg  Pulse 106  Temp(Src) 98.7 F (37.1 C) (Oral)  Ht  (1.6 m)  Wt 202 lb (91.627 kg)  BMI 35.79 kg/m2  SpO2 94%   Review of Systems She denies hypoglycemia    Objective:   Physical Exam VITAL SIGNS:  See vs  page GENERAL: no distress SKIN:  Insulin injection sites at the anterior abdomen are normal       Assessment & Plan:  DM: control is apparently improved  Patient is advised the following: Patient Instructions  blood tests are requested for you today.  We'll let you know about the results.   Based on the results, i hope you will be good to go for your surgery.   Please come back for a follow-up appointment in 3 months.

## 2014-11-05 LAB — FRUCTOSAMINE: Fructosamine: 269 umol/L (ref 190–270)

## 2014-11-06 ENCOUNTER — Telehealth: Payer: Self-pay | Admitting: Endocrinology

## 2014-11-06 NOTE — Telephone Encounter (Signed)
Patient calling for her lab results.    Please advise.

## 2014-11-06 NOTE — Telephone Encounter (Signed)
See note below and please advise, Thanks! 

## 2014-11-06 NOTE — Telephone Encounter (Signed)
please call patient: It is similar to an a1c of 6.6.  In my opinion, you are good to go for your surgery.

## 2014-11-07 NOTE — Telephone Encounter (Signed)
Letter faxed to [facility].

## 2014-11-07 NOTE — Telephone Encounter (Signed)
Pt advised of note below and voiced understanding. Pt requested a letter to be completed stating she is cleared for surgery. [facility] Attention Dr. Thomasena Edisollins. Fax number [phone].

## 2014-11-07 NOTE — Telephone Encounter (Signed)
Done.  It is now on epic

## 2014-11-10 ENCOUNTER — Telehealth: Payer: Self-pay | Admitting: Endocrinology

## 2014-11-10 NOTE — Telephone Encounter (Signed)
[facility] results   Fax: [phone]  Atten: Matthias Hughsanielle Pierce   Thank you

## 2014-11-11 NOTE — Telephone Encounter (Signed)
See note below and please advise what labs should be sent.  Thanks!

## 2014-11-11 NOTE — Telephone Encounter (Signed)
done

## 2014-11-12 ENCOUNTER — Encounter: Payer: Self-pay | Admitting: Endocrinology

## 2014-11-12 ENCOUNTER — Telehealth: Payer: Self-pay | Admitting: Endocrinology

## 2014-11-12 NOTE — Telephone Encounter (Signed)
Left voicemail advising of note below. Requested call back from Dr. Thomasena Edisollins' office if nurse would need to discuss.

## 2014-11-12 NOTE — Telephone Encounter (Signed)
please call dr Thomasena Ediscollins' office.  Pt had a fructosamine level (which is in epic), which converts to an a1c of 6.6.   Your options are to accept this as clearance from the standpoint of diabetes, or wait 2 more months for an a1c.

## 2014-11-14 ENCOUNTER — Other Ambulatory Visit: Payer: Self-pay | Admitting: Orthopedic Surgery

## 2014-11-19 ENCOUNTER — Encounter: Payer: Self-pay | Admitting: Endocrinology

## 2014-11-22 ENCOUNTER — Encounter: Payer: Self-pay | Admitting: Endocrinology

## 2014-11-24 ENCOUNTER — Other Ambulatory Visit: Payer: Self-pay

## 2014-11-24 MED ORDER — ONETOUCH ULTRA BLUE VI STRP: ORAL_STRIP | Status: DC

## 2014-12-01 ENCOUNTER — Encounter (HOSPITAL_COMMUNITY)
Admission: RE | Admit: 2014-12-01 | Discharge: 2014-12-01 | Disposition: A | Discharge status: Home / Self Care | Payer: Medicare Other | Source: Ambulatory Visit | Attending: Specialist | Admitting: Specialist

## 2014-12-01 ENCOUNTER — Encounter (HOSPITAL_COMMUNITY): Payer: Self-pay

## 2014-12-01 DIAGNOSIS — Z01812 Encounter for preprocedural laboratory examination: Secondary | ICD-10-CM | POA: Diagnosis present

## 2014-12-01 DIAGNOSIS — M179 Osteoarthritis of knee, unspecified: Secondary | ICD-10-CM | POA: Diagnosis not present

## 2014-12-01 LAB — URINALYSIS, ROUTINE W REFLEX MICROSCOPIC
Bilirubin Urine: NEGATIVE
Glucose, UA: NEGATIVE mg/dL
HGB URINE DIPSTICK: NEGATIVE
KETONES UR: NEGATIVE mg/dL
LEUKOCYTES UA: NEGATIVE
Nitrite: NEGATIVE
PH: 6 (ref 5.0–8.0)
Protein, ur: NEGATIVE mg/dL
Specific Gravity, Urine: 1.021 (ref 1.005–1.030)
Urobilinogen, UA: 0.2 mg/dL (ref 0.0–1.0)

## 2014-12-01 LAB — BASIC METABOLIC PANEL
Anion gap: 12 (ref 5–15)
BUN: 33 mg/dL — AB (ref 6–20)
CO2: 27 mmol/L (ref 22–32)
Calcium: 10.6 mg/dL — ABNORMAL HIGH (ref 8.9–10.3)
Chloride: 103 mmol/L (ref 101–111)
Creatinine, Ser: 1.1 mg/dL — ABNORMAL HIGH (ref 0.44–1.00)
GFR calc non Af Amer: 51 mL/min — ABNORMAL LOW (ref >60)
GFR, EST AFRICAN AMERICAN: 59 mL/min — AB (ref >60)
GLUCOSE: 141 mg/dL — AB (ref 70–99)
Potassium: 3.9 mmol/L (ref 3.5–5.1)
Sodium: 142 mmol/L (ref 135–145)

## 2014-12-01 LAB — CBC
HEMATOCRIT: 43.3 % (ref 36.0–46.0)
Hemoglobin: 14.3 g/dL (ref 12.0–15.0)
MCH: 29 pg (ref 26.0–34.0)
MCHC: 33 g/dL (ref 30.0–36.0)
MCV: 87.8 fL (ref 78.0–100.0)
Platelets: 276 10*3/uL (ref 150–400)
RBC: 4.93 MIL/uL (ref 3.87–5.11)
RDW: 12.4 % (ref 11.5–15.5)
WBC: 8.9 10*3/uL (ref 4.0–10.5)

## 2014-12-01 LAB — SURGICAL PCR SCREEN
MRSA, PCR: NEGATIVE
Staphylococcus aureus: POSITIVE — AB

## 2014-12-01 LAB — PROTIME-INR
INR: 1.03 (ref 0.00–1.49)
Prothrombin Time: 13.7 seconds (ref 11.6–15.2)

## 2014-12-01 LAB — APTT: aPTT: 31 seconds (ref 24–37)

## 2014-12-01 NOTE — Progress Notes (Addendum)
Note per epic 11/12/2014 per Dr [name] discussing clearance EKG per epic 10/10/2014

## 2014-12-01 NOTE — Patient Instructions (Signed)
[name]  12/01/2014   Your procedure is scheduled on:     Thursday Dec 18, 2014  [facility] and follow signs to  Short Stay Center arrive at 11:00 AM.   Call this number if you have problems the morning of surgery [phone] or Presurgical Testing [phone].   Remember:  Do not eat food After Midnight but may take clear liquids till 7:00 am day of surgery then nothing by mouth. EAT A HEALTHY SNACK NIGHT PRIOR TO SURGERY.       Take these medicines the morning of surgery with A SIP OF WATER: Omeprazole (prilosec)                               You may not have any metal on your body including hair pins and piercings  Do not wear jewelry, make-up, lotions, powders, perfumes, nail polish  or deodorant.  Do not shave body hair  48 hours(2 days) of CHG soap use.                Do not bring valuables to the hospital. [facility] IS NOT RESPONSIBLE FOR VALUABLES.  Contacts, dentures or bridgework may not be worn into surgery.  Leave suitcase in the car. After surgery it may be brought to your room.  For patients admitted to the hospital, checkout time is 11:00 AM the day of discharge.     Special Instructions: review fact sheets for MRSA information, Blood Transfusion fact sheet, Incentive Spirometry. ________________________________________________________________________  [facility] - Pre[facility] Before surgery, you can play an important role.  Because skin is not sterile, your skin needs to be as free of germs as possible.  You can reduce the number of germs on your skin by washing with CHG (chlorahexidine gluconate) soap before surgery.  CHG is an antiseptic cleaner which kills germs and bonds with the skin to continue killing germs even after washing. Please DO NOT use if you have an allergy to CHG or antibacterial soaps.  If your skin becomes reddened/irritated stop using the CHG and inform your nurse when you arrive at Short  Stay. Do not shave (including legs and underarms) for at least 48 hours prior to the first CHG shower.  You may shave your face/neck. Please follow these instructions carefully:  1.  Shower with CHG Soap the night before surgery and the  morning of Surgery.  2.  If you choose to wash your hair, wash your hair first as usual with your  normal  shampoo.  3.  After you shampoo, rinse your hair and body thoroughly to remove the  shampoo.                           4.  Use CHG as you would any other liquid soap.  You can apply chg directly  to the skin and wash                       Gently with a scrungie or clean washcloth.  5.  Apply the CHG Soap to your body ONLY FROM THE NECK DOWN.   Do not use on face/ open                           Wound or open sores. Avoid contact  with eyes, ears mouth and genitals (private parts).                       Wash face,  Genitals (private parts) with your normal soap.             6.  Wash thoroughly, paying special attention to the area where your surgery  will be performed.  7.  Thoroughly rinse your body with warm water from the neck down.  8.  DO NOT shower/wash with your normal soap after using and rinsing off  the CHG Soap.                9.  Pat yourself dry with a clean towel.            10.  Wear clean pajamas.            11.  Place clean sheets on your bed the night of your first shower and do not  sleep with pets. Day of Surgery : Do not apply any lotions/deodorants the morning of surgery.  Please wear clean clothes to the hospital/surgery center.  FAILURE TO FOLLOW THESE INSTRUCTIONS MAY RESULT IN THE CANCELLATION OF YOUR SURGERY PATIENT SIGNATURE_________________________________  NURSE SIGNATURE__________________________________  ________________________________________________________________________    CLEAR LIQUID DIET   Foods Allowed                                                                     Foods Excluded  Coffee and tea,  regular and decaf                             liquids that you cannot  Plain Jell-O in any flavor                                             see through such as: Fruit ices (not with fruit pulp)                                     milk, soups, orange juice  Iced Popsicles                                    All solid food Carbonated beverages, regular and diet                                    Cranberry, grape and apple juices Sports drinks like Gatorade Lightly seasoned clear broth or consume(fat free) Sugar, honey syrup  Sample Menu Breakfast                                Lunch  Supper Cranberry juice                    Beef broth                            Chicken broth Jell-O                                     Grape juice                           Apple juice Coffee or tea                        Jell-O                                      Popsicle                                                Coffee or tea                        Coffee or tea  _____________________________________________________________________    Incentive Spirometer  An incentive spirometer is a tool that can help keep your lungs clear and active. This tool measures how well you are filling your lungs with each breath. Taking long deep breaths may help reverse or decrease the chance of developing breathing (pulmonary) problems (especially infection) following:  A long period of time when you are unable to move or be active. BEFORE THE PROCEDURE   If the spirometer includes an indicator to show your best effort, your nurse or respiratory therapist will set it to a desired goal.  If possible, sit up straight or lean slightly forward. Try not to slouch.  Hold the incentive spirometer in an upright position. INSTRUCTIONS FOR USE   Sit on the edge of your bed if possible, or sit up as far as you can in bed or on a chair.  Hold the incentive spirometer in an upright  position.  Breathe out normally.  Place the mouthpiece in your mouth and seal your lips tightly around it.  Breathe in slowly and as deeply as possible, raising the piston or the ball toward the top of the column.  Hold your breath for 3-5 seconds or for as long as possible. Allow the piston or ball to fall to the bottom of the column.  Remove the mouthpiece from your mouth and breathe out normally.  Rest for a few seconds and repeat Steps 1 through 7 at least 10 times every 1-2 hours when you are awake. Take your time and take a few normal breaths between deep breaths.  The spirometer may include an indicator to show your best effort. Use the indicator as a goal to work toward during each repetition.  After each set of 10 deep breaths, practice coughing to be sure your lungs are clear. If you have an incision (the cut made at the time of surgery), support your incision when coughing by placing a pillow or rolled up towels firmly against  it. Once you are able to get out of bed, walk around indoors and cough well. You may stop using the incentive spirometer when instructed by your caregiver.  RISKS AND COMPLICATIONS  Take your time so you do not get dizzy or light-headed.  If you are in pain, you may need to take or ask for pain medication before doing incentive spirometry. It is harder to take a deep breath if you are having pain. AFTER USE  Rest and breathe slowly and easily.  It can be helpful to keep track of a log of your progress. Your caregiver can provide you with a simple table to help with this. If you are using the spirometer at home, follow these instructions: SEEK MEDICAL CARE IF:   You are having difficultly using the spirometer.  You have trouble using the spirometer as often as instructed.  Your pain medication is not giving enough relief while using the spirometer.  You develop fever of 100.5 F (38.1 C) or higher. SEEK IMMEDIATE MEDICAL CARE IF:   You cough  up bloody sputum that had not been present before.  You develop fever of 102 F (38.9 C) or greater.  You develop worsening pain at or near the incision site. MAKE SURE YOU:   Understand these instructions.  Will watch your condition.  Will get help right away if you are not doing well or get worse. Document Released: 11/28/2006 Document Revised: 10/10/2011 Document Reviewed: 01/29/2007 Exit[employer] Patient Information 2014 Exit[employer], [place].   ________________________________________________________________________  WHAT IS A BLOOD TRANSFUSION? Blood Transfusion Information  A transfusion is the replacement of blood or some of its parts. Blood is made up of multiple cells which provide different functions.  Red blood cells carry oxygen and are used for blood loss replacement.  White blood cells fight against infection.  Platelets control bleeding.  Plasma helps clot blood.  Other blood products are available for specialized needs, such as hemophilia or other clotting disorders. BEFORE THE TRANSFUSION  Who gives blood for transfusions?   Healthy volunteers who are fully evaluated to make sure their blood is safe. This is blood bank blood. Transfusion therapy is the safest it has ever been in the practice of medicine. Before blood is taken from a donor, a complete history is taken to make sure that person has no history of diseases nor engages in risky social behavior (examples are intravenous drug use or sexual activity with multiple partners). The donor's travel history is screened to minimize risk of transmitting infections, such as malaria. The donated blood is tested for signs of infectious diseases, such as HIV and hepatitis. The blood is then tested to be sure it is compatible with you in order to minimize the chance of a transfusion reaction. If you or a relative donates blood, this is often done in anticipation of surgery and is not appropriate for emergency situations. It takes  many days to process the donated blood. RISKS AND COMPLICATIONS Although transfusion therapy is very safe and saves many lives, the main dangers of transfusion include:   Getting an infectious disease.  Developing a transfusion reaction. This is an allergic reaction to something in the blood you were given. Every precaution is taken to prevent this. The decision to have a blood transfusion has been considered carefully by your caregiver before blood is given. Blood is not given unless the benefits outweigh the risks. AFTER THE TRANSFUSION  Right after receiving a blood transfusion, you will usually feel much better and more  energetic. This is especially true if your red blood cells have gotten low (anemic). The transfusion raises the level of the red blood cells which carry oxygen, and this usually causes an energy increase.  The nurse administering the transfusion will monitor you carefully for complications. HOME CARE INSTRUCTIONS  No special instructions are needed after a transfusion. You may find your energy is better. Speak with your caregiver about any limitations on activity for underlying diseases you may have. SEEK MEDICAL CARE IF:   Your condition is not improving after your transfusion.  You develop redness or irritation at the intravenous (IV) site. SEEK IMMEDIATE MEDICAL CARE IF:  Any of the following symptoms occur over the next 12 hours:  Shaking chills.  You have a temperature by mouth above 102 F (38.9 C), not controlled by medicine.  Chest, back, or muscle pain.  People around you feel you are not acting correctly or are confused.  Shortness of breath or difficulty breathing.  Dizziness and fainting.  You get a rash or develop hives.  You have a decrease in urine output.  Your urine turns a dark color or changes to pink, red, or brown. Any of the following symptoms occur over the next 10 days:  You have a temperature by mouth above 102 F (38.9 C), not  controlled by medicine.  Shortness of breath.  Weakness after normal activity.  The white part of the eye turns yellow (jaundice).  You have a decrease in the amount of urine or are urinating less often.  Your urine turns a dark color or changes to pink, red, or brown. Document Released: 07/15/2000 Document Revised: 10/10/2011 Document Reviewed: 03/03/2008 [facility] Patient Information 2014 [place], [place].  _______________________________________________________________________

## 2014-12-02 NOTE — Progress Notes (Signed)
BMP results in epic per PAT visit 12/01/2014 sent to Dr Carlynn Purl Collins

## 2014-12-02 NOTE — Progress Notes (Signed)
Surgical screening results in epic per PAT visit 12/01/2014 positive for Staph. Results sent to Dr Carlynn Purl Collins. Prescription for Mupriocin Ointment called in [facility] spoke with Selena BattenKim / Pharmacist. Pt notified.

## 2014-12-05 ENCOUNTER — Encounter: Payer: Self-pay | Admitting: Endocrinology

## 2014-12-10 NOTE — H&P (Signed)
TOTAL KNEE ADMISSION H&P  Patient is being admitted for left total knee arthroplasty.  Subjective:  Chief Complaint:left knee pain.  HPI: Pamela Love, 64 y.o. female, has a history of pain and functional disability in the left knee due to arthritis and has failed non-surgical conservative treatments for greater than 12 weeks to includeNSAID's and/or analgesics, corticosteriod injections, viscosupplementation injections, flexibility and strengthening excercises, use of assistive devices, weight reduction as appropriate and activity modification.  Onset of symptoms was gradual, starting 3 years ago with gradually worsening course since that time. The patient noted no past surgery on the left knee(s).  Patient currently rates pain in the left knee(s) at 8 out of 10 with activity. Patient has night pain, worsening of pain with activity and weight bearing, pain that interferes with activities of daily living, pain with passive range of motion and joint swelling.  Patient has evidence of subchondral sclerosis, joint subluxation and joint space narrowing by imaging studies. This patient has had osteoarthritis. There is no active infection.  Patient Active Problem List   Diagnosis Date Noted  . Diabetes 10/21/2014  . Osteoarthrosis, unspecified whether generalized or localized, involving lower leg 10/21/2014  . HTN (hypertension) 10/21/2014  . Dyslipidemia 10/21/2014   Past Medical History  Diagnosis Date  . Hypertension   . Hyperlipidemia   . Shortness of breath dyspnea     with exertion  . Arthritis   . Bursitis     hips  . Osteoporosis   . History of nonmelanoma skin cancer   . GERD (gastroesophageal reflux disease)   . History of nephritis     as child  . OAB (overactive bladder)   . Diabetes mellitus without complication     diet controlled - has Rx for Glymipride but has not started taking yet  . Complication of anesthesia     "spinal did not work with second c section"    Past  Surgical History  Procedure Laterality Date  . Cesarean section      x2  . Uterine fibroid surgery    . Abdominal hysterectomy  2005  . Eye surgery      bilat cataract surgery 2015  . Colonscopy       No prescriptions prior to admission   Allergies  Allergen Reactions  . Colchicine     Stomach cramps  . Diclofenac     Oral causes stomach cramps  . Janumet [Sitagliptin-Metformin Hcl] Nausea And Vomiting    Lightheaded   . Septra [Sulfamethoxazole-Trimethoprim] Itching  . Trulicity [Dulaglutide] Diarrhea and Nausea And Vomiting  . Zetia [Ezetimibe]     Muscle pain  . Imipramine Rash  . Tape Rash    Paper tape ok to use     History  Substance Use Topics  . Smoking status: Never Smoker   . Smokeless tobacco: Never Used  . Alcohol Use: Yes     Comment: rare    Family History  Problem Relation Age of Onset  . Diabetes Maternal Grandmother   . Diabetes Maternal Grandfather      Review of Systems  Constitutional: Negative.   HENT: Negative.   Eyes: Negative.   Respiratory: Negative.   Cardiovascular: Negative.   Gastrointestinal: Negative.   Genitourinary: Negative.   Musculoskeletal: Positive for back pain and joint pain.  Skin: Negative.   Neurological: Negative.   Endo/Heme/Allergies: Negative.   Psychiatric/Behavioral: Negative.     Objective:  Physical Exam  Constitutional: She is oriented to person, place, and time.  She appears well-developed.  HENT:  Head: Atraumatic.  Eyes: EOM are normal.  Neck: Normal range of motion.  Cardiovascular: Normal rate, normal heart sounds and intact distal pulses.   Respiratory: Effort normal and breath sounds normal.  GI: Soft.  Genitourinary:  Deferred  Musculoskeletal:  LEft knee pain with ROM. LLE grossly n/v intact.  Neurological: She is alert and oriented to person, place, and time. She has normal reflexes.  Skin: Skin is warm and dry.  Psychiatric: Her behavior is normal.    Vital signs in last 24  hours:    Labs:   Estimated body mass index is 35.79 kg/(m^2) as calculated from the following:   Height as of 11/03/14: 5\' 3"  (1.6 m).   Weight as of 11/03/14: 91.627 kg (202 lb).   Imaging Review Plain radiographs demonstrate severe degenerative joint disease of the left knee(s). The overall alignment issignificant valgus. The bone quality appears to be good for age and reported activity level.  Assessment/Plan:  End stage arthritis, left knee   The patient history, physical examination, clinical judgment of the provider and imaging studies are consistent with end stage degenerative joint disease of the left knee(s) and total knee arthroplasty is deemed medically necessary. The treatment options including medical management, injection therapy arthroscopy and arthroplasty were discussed at length. The risks and benefits of total knee arthroplasty were presented and reviewed. The risks due to aseptic loosening, infection, stiffness, patella tracking problems, thromboembolic complications and other imponderables were discussed. The patient acknowledged the explanation, agreed to proceed with the plan and consent was signed. Patient is being admitted for inpatient treatment for surgery, pain control, PT, OT, prophylactic antibiotics, VTE prophylaxis, progressive ambulation and ADL's and discharge planning. The patient is planning to be discharged home with home health services   Contraindications and adverse affects of Tranexamic acid discussed in detail. Patient denies any of these at this time and understands the risks and benefits.

## 2014-12-15 ENCOUNTER — Encounter: Payer: Self-pay | Admitting: Endocrinology

## 2014-12-18 ENCOUNTER — Encounter (HOSPITAL_COMMUNITY): Payer: Self-pay | Admitting: *Deleted

## 2014-12-18 ENCOUNTER — Encounter (HOSPITAL_COMMUNITY)
Admission: RE | Disposition: A | Discharge status: Home Health | Payer: Self-pay | Source: Ambulatory Visit | Attending: Specialist

## 2014-12-18 ENCOUNTER — Inpatient Hospital Stay (HOSPITAL_COMMUNITY)
Admission: RE | Admit: 2014-12-18 | Discharge: 2014-12-20 | DRG: 470 | Disposition: A | Discharge status: Home Health | Payer: Medicare Other | Source: Ambulatory Visit | Attending: Specialist | Admitting: Specialist

## 2014-12-18 ENCOUNTER — Inpatient Hospital Stay (HOSPITAL_COMMUNITY): Payer: Medicare Other | Admitting: Certified Registered"

## 2014-12-18 DIAGNOSIS — Z6835 Body mass index (BMI) 35.0-35.9, adult: Secondary | ICD-10-CM

## 2014-12-18 DIAGNOSIS — Z90710 Acquired absence of both cervix and uterus: Secondary | ICD-10-CM

## 2014-12-18 DIAGNOSIS — E119 Type 2 diabetes mellitus without complications: Secondary | ICD-10-CM | POA: Diagnosis present

## 2014-12-18 DIAGNOSIS — N3281 Overactive bladder: Secondary | ICD-10-CM | POA: Diagnosis present

## 2014-12-18 DIAGNOSIS — E785 Hyperlipidemia, unspecified: Secondary | ICD-10-CM | POA: Diagnosis present

## 2014-12-18 DIAGNOSIS — Z96659 Presence of unspecified artificial knee joint: Secondary | ICD-10-CM

## 2014-12-18 DIAGNOSIS — K219 Gastro-esophageal reflux disease without esophagitis: Secondary | ICD-10-CM | POA: Diagnosis present

## 2014-12-18 DIAGNOSIS — Z01812 Encounter for preprocedural laboratory examination: Secondary | ICD-10-CM

## 2014-12-18 DIAGNOSIS — I10 Essential (primary) hypertension: Secondary | ICD-10-CM | POA: Diagnosis present

## 2014-12-18 DIAGNOSIS — M25562 Pain in left knee: Secondary | ICD-10-CM | POA: Diagnosis present

## 2014-12-18 DIAGNOSIS — Z85828 Personal history of other malignant neoplasm of skin: Secondary | ICD-10-CM | POA: Diagnosis not present

## 2014-12-18 DIAGNOSIS — M810 Age-related osteoporosis without current pathological fracture: Secondary | ICD-10-CM | POA: Diagnosis present

## 2014-12-18 DIAGNOSIS — M1712 Unilateral primary osteoarthritis, left knee: Principal | ICD-10-CM | POA: Diagnosis present

## 2014-12-18 HISTORY — PX: TOTAL KNEE ARTHROPLASTY: SHX125

## 2014-12-18 HISTORY — DX: Presence of unspecified artificial knee joint: Z96.659

## 2014-12-18 HISTORY — DX: Unilateral primary osteoarthritis, left knee: M17.12

## 2014-12-18 LAB — TYPE AND SCREEN
ABO/RH(D): B POS
ANTIBODY SCREEN: NEGATIVE

## 2014-12-18 LAB — GLUCOSE, CAPILLARY
GLUCOSE-CAPILLARY: 135 mg/dL — AB (ref 65–99)
GLUCOSE-CAPILLARY: 124 mg/dL — AB (ref 65–99)
GLUCOSE-CAPILLARY: 220 mg/dL — AB (ref 65–99)
Glucose-Capillary: 163 mg/dL — ABNORMAL HIGH (ref 65–99)

## 2014-12-18 SURGERY — ARTHROPLASTY, KNEE, TOTAL
Anesthesia: Monitor Anesthesia Care | Site: Knee | Laterality: Left

## 2014-12-18 MED ORDER — ONDANSETRON HCL 4 MG/2ML IJ SOLN
INTRAMUSCULAR | Status: DC | PRN
Start: 2014-12-18 — End: 2014-12-18
  Administered 2014-12-18: 4 mg via INTRAVENOUS

## 2014-12-18 MED ORDER — PANTOPRAZOLE SODIUM 40 MG PO TBEC
40.0000 mg | DELAYED_RELEASE_TABLET | Freq: Every day | ORAL | Status: DC
  Administered 2014-12-18 – 2014-12-20 (×3): 40 mg via ORAL
  Filled 2014-12-18 (×3): qty 1

## 2014-12-18 MED ORDER — POTASSIUM CHLORIDE IN NACL 20-0.9 MEQ/L-% IV SOLN
INTRAVENOUS | Status: DC
  Administered 2014-12-18: 19:00:00 via INTRAVENOUS
  Filled 2014-12-18 (×4): qty 1000

## 2014-12-18 MED ORDER — INSULIN ASPART 100 UNIT/ML ~~LOC~~ SOLN
0.0000 [IU] | Freq: Three times a day (TID) | SUBCUTANEOUS | Status: DC
  Administered 2014-12-19: 2 [IU] via SUBCUTANEOUS
  Administered 2014-12-19: 3 [IU] via SUBCUTANEOUS
  Administered 2014-12-19 – 2014-12-20 (×2): 2 [IU] via SUBCUTANEOUS

## 2014-12-18 MED ORDER — LIDOCAINE HCL (CARDIAC) 20 MG/ML IV SOLN
INTRAVENOUS | Status: DC | PRN
  Administered 2014-12-18: 20 mg via INTRAVENOUS

## 2014-12-18 MED ORDER — MAGNESIUM CITRATE PO SOLN: 1.0000 | Freq: Once | ORAL | Status: AC | PRN

## 2014-12-18 MED ORDER — ZOLPIDEM TARTRATE 5 MG PO TABS: 5.0000 mg | ORAL_TABLET | Freq: Every evening | ORAL | Status: DC | PRN

## 2014-12-18 MED ORDER — PHENOL 1.4 % MT LIQD
1.0000 | OROMUCOSAL | Status: DC | PRN
  Filled 2014-12-18: qty 177

## 2014-12-18 MED ORDER — METOCLOPRAMIDE HCL 5 MG/ML IJ SOLN: 5.0000 mg | Freq: Three times a day (TID) | INTRAMUSCULAR | Status: DC | PRN

## 2014-12-18 MED ORDER — DEXAMETHASONE SODIUM PHOSPHATE 10 MG/ML IJ SOLN
10.0000 mg | Freq: Once | INTRAMUSCULAR | Status: AC
  Administered 2014-12-19: 10 mg via INTRAVENOUS
  Filled 2014-12-18: qty 1

## 2014-12-18 MED ORDER — SODIUM CHLORIDE 0.9 % IR SOLN
Status: DC | PRN
  Administered 2014-12-18: 1000 mL

## 2014-12-18 MED ORDER — PROPOFOL 10 MG/ML IV BOLUS
INTRAVENOUS | Status: AC
  Filled 2014-12-18: qty 20

## 2014-12-18 MED ORDER — VALSARTAN-HYDROCHLOROTHIAZIDE 320-25 MG PO TABS: 1.0000 | ORAL_TABLET | Freq: Every day | ORAL | Status: DC

## 2014-12-18 MED ORDER — MIDAZOLAM HCL 2 MG/2ML IJ SOLN
INTRAMUSCULAR | Status: AC
  Filled 2014-12-18: qty 2

## 2014-12-18 MED ORDER — DEXAMETHASONE SODIUM PHOSPHATE 10 MG/ML IJ SOLN
INTRAMUSCULAR | Status: AC
  Filled 2014-12-18: qty 1

## 2014-12-18 MED ORDER — ENOXAPARIN SODIUM 30 MG/0.3ML ~~LOC~~ SOLN
30.0000 mg | Freq: Two times a day (BID) | SUBCUTANEOUS | Status: DC
  Administered 2014-12-19 – 2014-12-20 (×3): 30 mg via SUBCUTANEOUS
  Filled 2014-12-18 (×5): qty 0.3

## 2014-12-18 MED ORDER — CEFAZOLIN SODIUM-DEXTROSE 2-3 GM-% IV SOLR
2.0000 g | Freq: Four times a day (QID) | INTRAVENOUS | Status: AC
  Administered 2014-12-18 – 2014-12-19 (×2): 2 g via INTRAVENOUS
  Filled 2014-12-18 (×2): qty 50

## 2014-12-18 MED ORDER — METOCLOPRAMIDE HCL 10 MG PO TABS: 5.0000 mg | ORAL_TABLET | Freq: Three times a day (TID) | ORAL | Status: DC | PRN

## 2014-12-18 MED ORDER — FENTANYL CITRATE (PF) 100 MCG/2ML IJ SOLN
INTRAMUSCULAR | Status: AC
  Filled 2014-12-18: qty 2

## 2014-12-18 MED ORDER — BUPIVACAINE-EPINEPHRINE 0.25% -1:200000 IJ SOLN
INTRAMUSCULAR | Status: DC | PRN
  Administered 2014-12-18: 30 mL

## 2014-12-18 MED ORDER — KETOROLAC TROMETHAMINE 30 MG/ML IJ SOLN
INTRAMUSCULAR | Status: DC | PRN
  Administered 2014-12-18: 30 mg via INTRAVENOUS

## 2014-12-18 MED ORDER — DARIFENACIN HYDROBROMIDE ER 15 MG PO TB24
15.0000 mg | ORAL_TABLET | Freq: Every day | ORAL | Status: DC
  Administered 2014-12-18 – 2014-12-20 (×3): 15 mg via ORAL
  Filled 2014-12-18 (×3): qty 1

## 2014-12-18 MED ORDER — OXYCODONE HCL 5 MG PO TABS: 5.0000 mg | ORAL_TABLET | Freq: Once | ORAL | Status: DC | PRN

## 2014-12-18 MED ORDER — DEXAMETHASONE SODIUM PHOSPHATE 10 MG/ML IJ SOLN
10.0000 mg | Freq: Once | INTRAMUSCULAR | Status: AC
  Administered 2014-12-18: 10 mg via INTRAVENOUS

## 2014-12-18 MED ORDER — MIDAZOLAM HCL 5 MG/5ML IJ SOLN
INTRAMUSCULAR | Status: DC | PRN
  Administered 2014-12-18: 2 mg via INTRAVENOUS

## 2014-12-18 MED ORDER — SODIUM CHLORIDE 0.9 % IJ SOLN
INTRAMUSCULAR | Status: AC
  Filled 2014-12-18: qty 50

## 2014-12-18 MED ORDER — PROPOFOL 10 MG/ML IV BOLUS
INTRAVENOUS | Status: DC | PRN
  Administered 2014-12-18: 20 mg via INTRAVENOUS

## 2014-12-18 MED ORDER — DULOXETINE HCL 20 MG PO CPEP
40.0000 mg | ORAL_CAPSULE | Freq: Every day | ORAL | Status: DC
  Administered 2014-12-18 – 2014-12-20 (×2): 40 mg via ORAL
  Filled 2014-12-18 (×3): qty 2

## 2014-12-18 MED ORDER — BUPIVACAINE-EPINEPHRINE (PF) 0.25% -1:200000 IJ SOLN
INTRAMUSCULAR | Status: AC
  Filled 2014-12-18: qty 30

## 2014-12-18 MED ORDER — ONDANSETRON HCL 4 MG/2ML IJ SOLN: 4.0000 mg | Freq: Four times a day (QID) | INTRAMUSCULAR | Status: DC | PRN

## 2014-12-18 MED ORDER — METHOCARBAMOL 1000 MG/10ML IJ SOLN
500.0000 mg | Freq: Four times a day (QID) | INTRAVENOUS | Status: DC | PRN
  Filled 2014-12-18: qty 5

## 2014-12-18 MED ORDER — DEXTROSE 5 % IV SOLN
10.0000 mg | INTRAVENOUS | Status: DC | PRN
  Administered 2014-12-18: 15 ug/min via INTRAVENOUS

## 2014-12-18 MED ORDER — IRBESARTAN 300 MG PO TABS
300.0000 mg | ORAL_TABLET | Freq: Every day | ORAL | Status: DC
  Administered 2014-12-18 – 2014-12-19 (×2): 300 mg via ORAL
  Filled 2014-12-18 (×3): qty 1

## 2014-12-18 MED ORDER — ACETAMINOPHEN 10 MG/ML IV SOLN
1000.0000 mg | Freq: Once | INTRAVENOUS | Status: DC
  Filled 2014-12-18 (×2): qty 100

## 2014-12-18 MED ORDER — CEFAZOLIN SODIUM-DEXTROSE 2-3 GM-% IV SOLR
INTRAVENOUS | Status: AC
  Filled 2014-12-18: qty 50

## 2014-12-18 MED ORDER — ONDANSETRON HCL 4 MG/2ML IJ SOLN
INTRAMUSCULAR | Status: AC
  Filled 2014-12-18: qty 2

## 2014-12-18 MED ORDER — ALUM & MAG HYDROXIDE-SIMETH 200-200-20 MG/5ML PO SUSP: 30.0000 mL | ORAL | Status: DC | PRN

## 2014-12-18 MED ORDER — LACTATED RINGERS IV SOLN
INTRAVENOUS | Status: DC
  Administered 2014-12-18 (×2): via INTRAVENOUS
  Administered 2014-12-18: 1000 mL via INTRAVENOUS

## 2014-12-18 MED ORDER — ACETAMINOPHEN 10 MG/ML IV SOLN
INTRAVENOUS | Status: DC | PRN
  Administered 2014-12-18: 1000 mg via INTRAVENOUS

## 2014-12-18 MED ORDER — PROMETHAZINE HCL 25 MG/ML IJ SOLN: 6.2500 mg | INTRAMUSCULAR | Status: DC | PRN

## 2014-12-18 MED ORDER — FENTANYL CITRATE (PF) 250 MCG/5ML IJ SOLN
INTRAMUSCULAR | Status: AC
  Filled 2014-12-18: qty 5

## 2014-12-18 MED ORDER — PROPOFOL INFUSION 10 MG/ML OPTIME
INTRAVENOUS | Status: DC | PRN
  Administered 2014-12-18: 100 ug/kg/min via INTRAVENOUS

## 2014-12-18 MED ORDER — ROSUVASTATIN CALCIUM 10 MG PO TABS
10.0000 mg | ORAL_TABLET | Freq: Every day | ORAL | Status: DC
  Administered 2014-12-18 – 2014-12-20 (×3): 10 mg via ORAL
  Filled 2014-12-18 (×3): qty 1

## 2014-12-18 MED ORDER — MENTHOL 3 MG MT LOZG: 1.0000 | LOZENGE | OROMUCOSAL | Status: DC | PRN

## 2014-12-18 MED ORDER — EPHEDRINE SULFATE 50 MG/ML IJ SOLN
INTRAMUSCULAR | Status: DC | PRN
  Administered 2014-12-18 (×2): 10 mg via INTRAVENOUS

## 2014-12-18 MED ORDER — PHENYLEPHRINE 40 MCG/ML (10ML) SYRINGE FOR IV PUSH (FOR BLOOD PRESSURE SUPPORT)
PREFILLED_SYRINGE | INTRAVENOUS | Status: AC
  Filled 2014-12-18: qty 10

## 2014-12-18 MED ORDER — HYDROMORPHONE HCL 1 MG/ML IJ SOLN
1.0000 mg | INTRAMUSCULAR | Status: DC | PRN
  Administered 2014-12-18: 1 mg via INTRAVENOUS
  Filled 2014-12-18: qty 1

## 2014-12-18 MED ORDER — OXYCODONE HCL 5 MG PO TABS
5.0000 mg | ORAL_TABLET | ORAL | Status: DC | PRN
  Administered 2014-12-18 (×2): 5 mg via ORAL
  Administered 2014-12-19 – 2014-12-20 (×7): 10 mg via ORAL
  Filled 2014-12-18 (×3): qty 2
  Filled 2014-12-18 (×2): qty 1
  Filled 2014-12-18: qty 2
  Filled 2014-12-18: qty 1
  Filled 2014-12-18 (×3): qty 2

## 2014-12-18 MED ORDER — METHOCARBAMOL 500 MG PO TABS
500.0000 mg | ORAL_TABLET | Freq: Four times a day (QID) | ORAL | Status: DC | PRN
  Administered 2014-12-18 – 2014-12-20 (×5): 500 mg via ORAL
  Filled 2014-12-18 (×5): qty 1

## 2014-12-18 MED ORDER — DIPHENHYDRAMINE HCL 12.5 MG/5ML PO ELIX: 12.5000 mg | ORAL_SOLUTION | ORAL | Status: DC | PRN

## 2014-12-18 MED ORDER — SODIUM CHLORIDE 0.9 % IV SOLN
INTRAVENOUS | Status: DC
Start: 2014-12-18 — End: 2014-12-18

## 2014-12-18 MED ORDER — POVIDONE-IODINE 7.5 % EX SOLN: Freq: Once | CUTANEOUS | Status: DC

## 2014-12-18 MED ORDER — LIDOCAINE HCL (CARDIAC) 20 MG/ML IV SOLN
INTRAVENOUS | Status: AC
  Filled 2014-12-18: qty 5

## 2014-12-18 MED ORDER — BUPIVACAINE IN DEXTROSE 0.75-8.25 % IT SOLN
INTRATHECAL | Status: DC | PRN
  Administered 2014-12-18: 1.8 mL via INTRATHECAL

## 2014-12-18 MED ORDER — TRANEXAMIC ACID 1000 MG/10ML IV SOLN
1000.0000 mg | INTRAVENOUS | Status: AC
  Administered 2014-12-18: 1000 mg via INTRAVENOUS
  Filled 2014-12-18: qty 10

## 2014-12-18 MED ORDER — PHENYLEPHRINE HCL 10 MG/ML IJ SOLN
INTRAMUSCULAR | Status: AC
  Filled 2014-12-18: qty 1

## 2014-12-18 MED ORDER — SODIUM CHLORIDE 0.9 % IJ SOLN
INTRAMUSCULAR | Status: DC | PRN
  Administered 2014-12-18: 50 mL

## 2014-12-18 MED ORDER — BISACODYL 5 MG PO TBEC: 5.0000 mg | DELAYED_RELEASE_TABLET | Freq: Every day | ORAL | Status: DC | PRN

## 2014-12-18 MED ORDER — HYDROMORPHONE HCL 1 MG/ML IJ SOLN: 0.2500 mg | INTRAMUSCULAR | Status: DC | PRN

## 2014-12-18 MED ORDER — CEFAZOLIN SODIUM-DEXTROSE 2-3 GM-% IV SOLR
2.0000 g | INTRAVENOUS | Status: AC
  Administered 2014-12-18: 2 g via INTRAVENOUS

## 2014-12-18 MED ORDER — HYDROCHLOROTHIAZIDE 25 MG PO TABS
25.0000 mg | ORAL_TABLET | Freq: Every day | ORAL | Status: DC
  Administered 2014-12-18: 25 mg via ORAL
  Filled 2014-12-18 (×3): qty 1

## 2014-12-18 MED ORDER — ONDANSETRON HCL 4 MG PO TABS: 4.0000 mg | ORAL_TABLET | Freq: Four times a day (QID) | ORAL | Status: DC | PRN

## 2014-12-18 MED ORDER — KETOROLAC TROMETHAMINE 30 MG/ML IJ SOLN
INTRAMUSCULAR | Status: AC
  Filled 2014-12-18: qty 1

## 2014-12-18 MED ORDER — FERROUS SULFATE 325 (65 FE) MG PO TABS
325.0000 mg | ORAL_TABLET | Freq: Three times a day (TID) | ORAL | Status: DC
  Administered 2014-12-19 – 2014-12-20 (×4): 325 mg via ORAL
  Filled 2014-12-18 (×7): qty 1

## 2014-12-18 MED ORDER — OXYCODONE HCL 5 MG/5ML PO SOLN
5.0000 mg | Freq: Once | ORAL | Status: DC | PRN
  Filled 2014-12-18: qty 5

## 2014-12-18 MED ORDER — DOCUSATE SODIUM 100 MG PO CAPS
100.0000 mg | ORAL_CAPSULE | Freq: Two times a day (BID) | ORAL | Status: DC
  Administered 2014-12-18 – 2014-12-20 (×4): 100 mg via ORAL

## 2014-12-18 MED ORDER — PHENYLEPHRINE HCL 10 MG/ML IJ SOLN
INTRAMUSCULAR | Status: DC | PRN
  Administered 2014-12-18 (×2): 80 ug via INTRAVENOUS

## 2014-12-18 MED ORDER — FENTANYL CITRATE (PF) 100 MCG/2ML IJ SOLN
INTRAMUSCULAR | Status: DC | PRN
  Administered 2014-12-18: 50 ug via INTRAVENOUS
  Administered 2014-12-18: 100 ug via INTRAVENOUS
  Administered 2014-12-18: 50 ug via INTRAVENOUS

## 2014-12-18 MED ORDER — ACETAMINOPHEN 325 MG PO TABS: 650.0000 mg | ORAL_TABLET | Freq: Four times a day (QID) | ORAL | Status: DC | PRN

## 2014-12-18 MED ORDER — POLYETHYLENE GLYCOL 3350 17 G PO PACK: 17.0000 g | PACK | Freq: Every day | ORAL | Status: DC | PRN

## 2014-12-18 MED ORDER — ACETAMINOPHEN 650 MG RE SUPP: 650.0000 mg | Freq: Four times a day (QID) | RECTAL | Status: DC | PRN

## 2014-12-18 SURGICAL SUPPLY — 63 items
BAG ZIPLOCK 12X15 (MISCELLANEOUS) ×6 IMPLANT
BANDAGE ELASTIC 4 VELCRO ST LF (GAUZE/BANDAGES/DRESSINGS) ×3 IMPLANT
BANDAGE ELASTIC 6 VELCRO ST LF (GAUZE/BANDAGES/DRESSINGS) ×3 IMPLANT
BANDAGE ESMARK 6X9 LF (GAUZE/BANDAGES/DRESSINGS) ×1 IMPLANT
BLADE SAG 18X100X1.27 (BLADE) ×3 IMPLANT
BLADE SAW SGTL 13.0X1.19X90.0M (BLADE) ×3 IMPLANT
BNDG ESMARK 6X9 LF (GAUZE/BANDAGES/DRESSINGS) ×3
BONE CEMENT GENTAMICIN (Cement) ×6 IMPLANT
CAP KNEE TOTAL 3 SIGMA ×3 IMPLANT
CEMENT BONE GENTAMICIN 40 (Cement) ×2 IMPLANT
CUFF TOURN SGL QUICK 34 (TOURNIQUET CUFF) ×3
CUFF TRNQT CYL 34X4X40X1 (TOURNIQUET CUFF) ×1 IMPLANT
DRAPE EXTREMITY T 121X128X90 (DRAPE) ×3 IMPLANT
DRAPE POUCH INSTRU U-SHP 10X18 (DRAPES) ×3 IMPLANT
DRAPE SHEET LG 3/4 BI-LAMINATE (DRAPES) ×3 IMPLANT
DRAPE U-SHAPE 47X51 STRL (DRAPES) ×3 IMPLANT
DRSG AQUACEL AG ADV 3.5X10 (GAUZE/BANDAGES/DRESSINGS) ×3 IMPLANT
DRSG TEGADERM 4X4.75 (GAUZE/BANDAGES/DRESSINGS) ×3 IMPLANT
DURAPREP 26ML APPLICATOR (WOUND CARE) ×6 IMPLANT
ELECT REM PT RETURN 9FT ADLT (ELECTROSURGICAL) ×3
ELECTRODE REM PT RTRN 9FT ADLT (ELECTROSURGICAL) ×1 IMPLANT
EVACUATOR 1/8 PVC DRAIN (DRAIN) ×3 IMPLANT
FACESHIELD WRAPAROUND (MASK) ×15 IMPLANT
GAUZE SPONGE 2X2 8PLY STRL LF (GAUZE/BANDAGES/DRESSINGS) ×1 IMPLANT
GLOVE BIOGEL PI IND STRL 8 (GLOVE) ×2 IMPLANT
GLOVE BIOGEL PI INDICATOR 8 (GLOVE) ×4
GLOVE SURG ORTHO 8.0 STRL STRW (GLOVE) ×3 IMPLANT
GLOVE SURG ORTHO 9.0 STRL STRW (GLOVE) ×3 IMPLANT
GLOVE SURG SS PI 7.5 STRL IVOR (GLOVE) ×3 IMPLANT
GOWN STRL REUS W/TWL XL LVL3 (GOWN DISPOSABLE) ×6 IMPLANT
HANDPIECE INTERPULSE COAX TIP (DISPOSABLE) ×2
IMMOBILIZER KNEE 20 (SOFTGOODS) ×3
IMMOBILIZER KNEE 20 THIGH 36 (SOFTGOODS) ×1 IMPLANT
KIT BASIN OR (CUSTOM PROCEDURE TRAY) ×3 IMPLANT
LIQUID BAND (GAUZE/BANDAGES/DRESSINGS) ×3 IMPLANT
NDL SAFETY ECLIPSE 18X1.5 (NEEDLE) ×1 IMPLANT
NEEDLE HYPO 18GX1.5 SHARP (NEEDLE) ×2
NS IRRIG 1000ML POUR BTL (IV SOLUTION) ×3 IMPLANT
PACK TOTAL JOINT (CUSTOM PROCEDURE TRAY) ×3 IMPLANT
POSITIONER SURGICAL ARM (MISCELLANEOUS) ×3 IMPLANT
SET HNDPC FAN SPRY TIP SCT (DISPOSABLE) ×1 IMPLANT
SET PAD KNEE POSITIONER (MISCELLANEOUS) ×3 IMPLANT
SPONGE GAUZE 2X2 STER 10/PKG (GAUZE/BANDAGES/DRESSINGS) ×2
SPONGE LAP 18X18 X RAY DECT (DISPOSABLE) IMPLANT
SPONGE SURGIFOAM ABS GEL 100 (HEMOSTASIS) IMPLANT
STOCKINETTE 6  STRL (DRAPES) ×2
STOCKINETTE 6 STRL (DRAPES) ×1 IMPLANT
SUCTION FRAZIER 12FR DISP (SUCTIONS) ×3 IMPLANT
SUT BONE WAX W31G (SUTURE) IMPLANT
SUT MNCRL AB 3-0 PS2 18 (SUTURE) ×3 IMPLANT
SUT VIC AB 1 CT1 27 (SUTURE) ×8
SUT VIC AB 1 CT1 27XBRD ANTBC (SUTURE) ×4 IMPLANT
SUT VIC AB 2-0 CT1 27 (SUTURE) ×4
SUT VIC AB 2-0 CT1 TAPERPNT 27 (SUTURE) ×2 IMPLANT
SUT VLOC 180 0 24IN GS25 (SUTURE) ×3 IMPLANT
SYR 50ML LL SCALE MARK (SYRINGE) ×3 IMPLANT
TAPE STRIPS DRAPE STRL (GAUZE/BANDAGES/DRESSINGS) ×3 IMPLANT
TOWEL OR 17X26 10 PK STRL BLUE (TOWEL DISPOSABLE) ×3 IMPLANT
TOWEL OR NON WOVEN STRL DISP B (DISPOSABLE) IMPLANT
TOWER CARTRIDGE SMART MIX (DISPOSABLE) ×3 IMPLANT
TRAY FOLEY W/METER SILVER 14FR (SET/KITS/TRAYS/PACK) ×3 IMPLANT
WATER STERILE IRR 1500ML POUR (IV SOLUTION) ×6 IMPLANT
WRAP KNEE MAXI GEL POST OP (GAUZE/BANDAGES/DRESSINGS) ×3 IMPLANT

## 2014-12-18 NOTE — Transfer of Care (Signed)
Immediate Anesthesia Transfer of Care Note  Patient: Pamela Love  Procedure(s) Performed: Procedure(s): TOTAL LEFT KNEE ARTHROPLASTY (Left)  Patient Location: PACU  Anesthesia Type:Spinal  Level of Consciousness: awake, alert , oriented and patient cooperative  Airway & Oxygen Therapy: Patient Spontanous Breathing and Patient connected to face mask oxygen  Post-op Assessment: Report given to RN and Post -op Vital signs reviewed and stable  Post vital signs: stable  Last Vitals:  Filed Vitals:   12/18/14 1058  BP: 134/77  Pulse: 94  Temp: 36.6 C  Resp: 16    Complications: No apparent anesthesia complications  Spinal level T 12

## 2014-12-18 NOTE — Anesthesia Preprocedure Evaluation (Addendum)
Anesthesia Evaluation  Patient identified by MRN, date of birth, ID band Patient awake    Reviewed: Allergy & Precautions, NPO status , Patient's Chart, lab work & pertinent test results  Airway Mallampati: II  TM Distance: >3 FB Neck ROM: Full    Dental   Pulmonary neg pulmonary ROS,  breath sounds clear to auscultation        Cardiovascular hypertension, Pt. on medications Rhythm:Regular Rate:Normal     Neuro/Psych negative neurological ROS     GI/Hepatic Neg liver ROS, GERD-  ,  Endo/Other  diabetes, Type 2, Insulin DependentMorbid obesity  Renal/GU Renal InsufficiencyRenal disease     Musculoskeletal  (+) Arthritis -,   Abdominal   Peds  Hematology negative hematology ROS (+)   Anesthesia Other Findings   Reproductive/Obstetrics                            Lab Results  Component Value Date   WBC 8.9 12/01/2014   HGB 14.3 12/01/2014   HCT 43.3 12/01/2014   MCV 87.8 12/01/2014   PLT 276 12/01/2014   Lab Results  Component Value Date   CREATININE 1.10* 12/01/2014   BUN 33* 12/01/2014   NA 142 12/01/2014   K 3.9 12/01/2014   CL 103 12/01/2014   CO2 27 12/01/2014    Anesthesia Physical Anesthesia Plan  ASA: III  Anesthesia Plan: MAC and Spinal   Post-op Pain Management:    Induction: Intravenous  Airway Management Planned: Natural Airway and Simple Face Mask  Additional Equipment:   Intra-op Plan:   Post-operative Plan:   Informed Consent: I have reviewed the patients History and Physical, chart, labs and discussed the procedure including the risks, benefits and alternatives for the proposed anesthesia with the patient or authorized representative who has indicated his/her understanding and acceptance.     Plan Discussed with: CRNA  Anesthesia Plan Comments:        Anesthesia Quick Evaluation

## 2014-12-18 NOTE — Interval H&P Note (Signed)
History and Physical Interval Note:  12/18/2014 1:39 PM  Pamela Love  has presented today for surgery, with the diagnosis of LEFT KNEE OA   The various methods of treatment have been discussed with the patient and family. After consideration of risks, benefits and other options for treatment, the patient has consented to  Procedure(s): TOTAL LEFT KNEE ARTHROPLASTY (Left) as a surgical intervention .  The patient's history has been reviewed, patient examined, no change in status, stable for surgery.  I have reviewed the patient's chart and labs.  Questions were answered to the patient's satisfaction.     Pamela Love

## 2014-12-18 NOTE — Op Note (Signed)
DATE OF SURGERY:  12/18/2014  TIME: 3:46 PM  PATIENT NAME:  Pamela Love    AGE: 64 y.o.   PRE-OPERATIVE DIAGNOSIS:  LEFT KNEE OSTEOARTHRITIS   POST-OPERATIVE DIAGNOSIS:  LEFT KNEE OSTEOARTHRITIS  PROCEDURE:  Procedure(s): TOTAL LEFT KNEE ARTHROPLASTY  SURGEON:  Wilford Merryfield ANDREW  ASSISTANT:  Bryson Stilwell, PA-C, present and scrubbed throughout the case, critical for assistance with exposure, retraction, instrumentation, and closure.  OPERATIVE IMPLANTS: Depuy PFC Sigma Rotating Platform.  Femur size 2.5, Tibia size 2.5, Patella size 32 3-peg oval button, with a 12.5 mm polyethylene insert. .5  PREOPERATIVE INDICATIONS:   Pamela Love is a 64 y.o. year old female with end stage bone on bone arthritis of the knee who failed conservative treatment and elected for Total Knee Arthroplasty.   The risks, benefits, and alternatives were discussed at length including but not limited to the risks of infection, bleeding, nerve injury, stiffness, blood clots, the need for revision surgery, cardiopulmonary complications, among others, and they were willing to proceed.  OPERATIVE DESCRIPTION:  The patient was brought to the operative room and placed in a supine position.  Spinal anesthesia was administered.  IV antibiotics were given.  The lower extremity was prepped and draped in the usual sterile fashion.  Time out was performed.  The leg was elevated and exsanguinated and the tourniquet was inflated.  Anterior quadriceps tendon splitting approach was performed.  The patella was retracted and osteophytes were removed.  The anterior horn of the medial and lateral meniscus was removed and cruciate ligaments resected.   The distal femur was opened with the drill and the intramedullary distal femoral cutting jig was utilized, set at 5 degrees resecting 10 mm off the distal femur.  Care was taken to protect the collateral ligaments.  The distal femoral sizing jig was applied, taking care  to avoid notching.  Then the 4-in-1 cutting jig was applied and the anterior and posterior femur was cut, along with the chamfer cuts.    Then the extramedullary tibial cutting jig was utilized making the appropriate cut using the anterior tibial crest as a reference building in appropriate posterior slope.  Care was taken during the cut to protect the medial and collateral ligaments.  The proximal tibia was removed along with the posterior horns of the menisci.   The posterior medial femoral osteophytes and posterior lateral femoral osteophytes were removed.    The flexion gap was then measured and was symmetric with the extension gap, measured at 12.  I completed the distal femoral preparation using the appropriate jig to prepare the box.  The patella was then measured, and cut with the saw.    The proximal tibia sized and prepared accordingly with the reamer and the punch, and then all components were trialed with the trial insert.  The knee was found to have excellent balance and full motion.    The above named components were then cemented into place and all excess cement was removed.  The trial polyethylene component was in place during cementation, and then was exchanged for the real polyethylene component.    The knee was easily taken through a range of motion and the patella tracked well and the knee irrigated copiously and the parapatellar and subcutaneous tissue closed with vicryl, and monocryl with steri strips for the skin.  The arthrotomy was closed at 90 of flexion. The wounds were dressed with sterile gauze and the tourniquet released and the patient was awakened and returned to the  PACU in stable and satisfactory condition.  There were no complications.  Total tourniquet time was 90 minutes.Vlock closure, 60cc saline,toradol,marcaine periosteal injectuion.

## 2014-12-18 NOTE — Plan of Care (Signed)
Problem: Consults Goal: Diagnosis- Total Joint Replacement Primary Total Knee     

## 2014-12-18 NOTE — Anesthesia Postprocedure Evaluation (Signed)
  Anesthesia Post-op Note  Patient: Pamela Love  Procedure(s) Performed: Procedure(s): TOTAL LEFT KNEE ARTHROPLASTY (Left)  Patient Location: PACU  Anesthesia Type:MAC and Spinal  Level of Consciousness: awake and alert   Airway and Oxygen Therapy: Patient Spontanous Breathing  Post-op Pain: mild  Post-op Assessment: Post-op Vital signs reviewed  Post-op Vital Signs: Reviewed  Last Vitals:  Filed Vitals:   12/18/14 1715  BP: 122/64  Pulse: 78  Temp: 36.7 C  Resp: 15    Complications: No apparent anesthesia complications

## 2014-12-18 NOTE — Anesthesia Procedure Notes (Signed)
Spinal Patient location during procedure: OR Start time: 12/18/2014 1:47 PM End time: 12/18/2014 1:51 PM Staffing Anesthesiologist: Suzette Battiest Resident/CRNA: Lajuana Carry E Performed by: resident/CRNA  Preanesthetic Checklist Completed: patient identified, site marked, surgical consent, pre-op evaluation, timeout performed, IV checked, risks and benefits discussed and monitors and equipment checked Spinal Block Patient position: sitting Prep: Betadine Patient monitoring: heart rate, continuous pulse ox and blood pressure Location: L3-4 Injection technique: single-shot Needle Needle type: Sprotte  Needle gauge: 24 G Needle length: 10 cm Assessment Sensory level: T6 Additional Notes Expiration date of kit checked and confirmed. Patient tolerated procedure well, without complications.

## 2014-12-19 ENCOUNTER — Encounter (HOSPITAL_COMMUNITY): Payer: Self-pay | Admitting: Specialist

## 2014-12-19 LAB — CBC
HCT: 35 % — ABNORMAL LOW (ref 36.0–46.0)
HEMOGLOBIN: 11.7 g/dL — AB (ref 12.0–15.0)
MCH: 29.3 pg (ref 26.0–34.0)
MCHC: 33.4 g/dL (ref 30.0–36.0)
MCV: 87.5 fL (ref 78.0–100.0)
PLATELETS: 227 10*3/uL (ref 150–400)
RBC: 4 MIL/uL (ref 3.87–5.11)
RDW: 12.7 % (ref 11.5–15.5)
WBC: 9.4 10*3/uL (ref 4.0–10.5)

## 2014-12-19 LAB — BASIC METABOLIC PANEL
ANION GAP: 8 (ref 5–15)
BUN: 20 mg/dL (ref 6–20)
CALCIUM: 9.2 mg/dL (ref 8.9–10.3)
CO2: 26 mmol/L (ref 22–32)
CREATININE: 0.97 mg/dL (ref 0.44–1.00)
Chloride: 105 mmol/L (ref 101–111)
GFR calc non Af Amer: 60 mL/min — ABNORMAL LOW (ref >60)
Glucose, Bld: 195 mg/dL — ABNORMAL HIGH (ref 65–99)
POTASSIUM: 4.7 mmol/L (ref 3.5–5.1)
SODIUM: 139 mmol/L (ref 135–145)

## 2014-12-19 LAB — GLUCOSE, CAPILLARY
GLUCOSE-CAPILLARY: 124 mg/dL — AB (ref 65–99)
GLUCOSE-CAPILLARY: 165 mg/dL — AB (ref 65–99)
Glucose-Capillary: 144 mg/dL — ABNORMAL HIGH (ref 65–99)
Glucose-Capillary: 184 mg/dL — ABNORMAL HIGH (ref 65–99)

## 2014-12-19 MED ORDER — OXYCODONE-ACETAMINOPHEN 5-325 MG PO TABS: 1.0000 | ORAL_TABLET | ORAL | Status: DC | PRN

## 2014-12-19 MED ORDER — BACLOFEN 10 MG PO TABS: 10.0000 mg | ORAL_TABLET | Freq: Three times a day (TID) | ORAL | Status: DC

## 2014-12-19 MED ORDER — ASPIRIN EC 325 MG PO TBEC: 325.0000 mg | DELAYED_RELEASE_TABLET | Freq: Two times a day (BID) | ORAL | Status: DC

## 2014-12-19 MED ORDER — FERROUS SULFATE 325 (65 FE) MG PO TABS: 325.0000 mg | ORAL_TABLET | Freq: Three times a day (TID) | ORAL | Status: DC

## 2014-12-19 NOTE — Care Management Note (Signed)
Case Management Note  Patient Details  Name: Pamela Love MRN: [id number] Date of Birth: 01/25/1951  Subjective/Objective:                 S/p total left knee arthroplasy   Action/Plan: Discharge planning. Spoke with patient at bedside, plans to return home with spouse. [facility], [facility], patient agreeable. Contacted Tim with [facility] to arrange. Contacted [facility] for DME.  Expected Discharge Date:                  Expected Discharge Plan:  Home w Home Health Services  In-House Referral:  Clinical Social Work  Discharge planning Services  CM Consult  Post Acute Care Choice:  Home Health Choice offered to:  Patient  DME Arranged:  3-N-1, Walker rolling DME Agency:  [facility]  HH Arranged:  [facility]  Status of Service:  Completed, signed off  Medicare Important Message Given:  N/A - LOS <3 / Initial given by admissions Date Medicare IM Given:    Medicare IM give by:    Date Additional Medicare IM Given:    Additional Medicare Important Message give by:     If discussed at Long Length of Stay Meetings, dates discussed:    Additional Comments:  Alexis Goodelleele, Karim Aiello K, RN 12/19/2014, 10:11 AM

## 2014-12-19 NOTE — Progress Notes (Signed)
CSW consulted for SNF placement. PN reviewed. Pt plans to d/c home following hospital d/c. RNCM will assist with d/c planning to SNF.

## 2014-12-19 NOTE — Progress Notes (Signed)
Subjective: 1 Day Post-Op Procedure(s) (LRB): TOTAL LEFT KNEE ARTHROPLASTY (Left) Patient reports pain as mild to left knee.  Tolerating PO's well. Reports a good night. Denies SOB, CP, or calf pain.  Objective: Vital signs in last 24 hours: Temp:  [97.6 F (36.4 C)-98.8 F (37.1 C)] 98.1 F (36.7 C) (05/20 0600) Pulse Rate:  [72-94] 74 (05/20 0600) Resp:  [11-18] 16 (05/20 0600) BP: (107-134)/(56-77) 108/59 mmHg (05/20 0600) SpO2:  [98 %-100 %] 100 % (05/20 0600) Weight:  [88.083 kg (194 lb 3 oz)] 88.083 kg (194 lb 3 oz) (05/19 1117)  Intake/Output from previous day: 05/19 0701 - 05/20 0700 In: 3890 [P.O.:480; I.V.:3410] Out: 1680 [Urine:1450; Drains:80; Blood:150] Intake/Output this shift:     Recent Labs  12/19/14 0400  HGB 11.7*    Recent Labs  12/19/14 0400  WBC 9.4  RBC 4.00  HCT 35.0*  PLT 227    Recent Labs  12/19/14 0400  NA 139  K 4.7  CL 105  CO2 26  BUN 20  CREATININE 0.97  GLUCOSE 195*  CALCIUM 9.2   No results for input(s): LABPT, INR in the last 72 hours.  Alert and oriented x3. RRR, Lungs clear, BS x4. Left Calf soft and non tender. L knee dressing C/D/I. No DVT signs. No signs of infection or compartment syndrome. LLE grossly neurovascularly intact.  Drain d/c'ed today. Pt tolerated well. Tip was intact. Hemostasis was obtained.  Assessment/Plan: 1 Day Post-Op Procedure(s) (LRB): TOTAL LEFT KNEE ARTHROPLASTY (Left) Up with PT Drain d/c'ed Plan D/c home tomorrow Continue current care   Tajanae Guilbault L 12/19/2014, 7:42 AM

## 2014-12-19 NOTE — Progress Notes (Signed)
Physical Therapy Treatment Patient Details Name: Pamela DimitriLynda D Haggar MRN: [id number] DOB: 02/06/1951 Today's Date: 12/19/2014    History of Present Illness L TKR    PT Comments    Pt progressing well with mobility and hopeful for dc tomorrow.  Follow Up Recommendations  Home health PT     Equipment Recommendations  Rolling walker with 5" wheels    Recommendations for Other Services OT consult     Precautions / Restrictions Precautions Precautions: Knee;Fall Required Braces or Orthoses: Knee Immobilizer - Right Knee Immobilizer - Right: Discontinue once straight leg raise with < 10 degree lag Restrictions Weight Bearing Restrictions: No LLE Weight Bearing: Weight bearing as tolerated    Mobility  Bed Mobility Overal bed mobility: Needs Assistance Bed Mobility: Supine to Sit;Sit to Supine     Supine to sit: Min guard Sit to supine: Min guard   General bed mobility comments: cues for sequence and use of R LE to self assist  Transfers Overall transfer level: Needs assistance Equipment used: Rolling walker (2 wheeled) Transfers: Sit to/from Stand Sit to Stand: Min guard         General transfer comment: Cues for LE management and use of UEs to self assist  Ambulation/Gait Ambulation/Gait assistance: Min assist;Min guard Ambulation Distance (Feet): 140 Feet (and 15' back from bathroom) Assistive device: Rolling walker (2 wheeled) Gait Pattern/deviations: Step-to pattern;Step-through pattern;Decreased step length - right;Decreased step length - left;Shuffle;Trunk flexed Gait velocity: decr   General Gait Details: Cues for posture, sequence and use of UEs to self assist   Stairs            Wheelchair Mobility    Modified Rankin (Stroke Patients Only)       Balance                                    Cognition Arousal/Alertness: Awake/alert Behavior During Therapy: WFL for tasks assessed/performed Overall Cognitive Status: Within  Functional Limits for tasks assessed                      Exercises Total Joint Exercises Ankle Circles/Pumps: AROM;Both;10 reps;Supine Quad Sets: AROM;Both;10 reps;Supine Heel Slides: AAROM;Left;15 reps;Supine Straight Leg Raises: AAROM;10 reps;Supine;Left Goniometric ROM: AAROM at R knee -10 - 70    General Comments        Pertinent Vitals/Pain Pain Assessment: 0-10 Pain Score: 2  Pain Location: L knee Pain Descriptors / Indicators: Aching;Sore Pain Intervention(s): Limited activity within patient's tolerance;Monitored during session;Premedicated before session;Ice applied    Home Living                      Prior Function            PT Goals (current goals can now be found in the care plan section) Acute Rehab PT Goals Patient Stated Goal: return to dancing  PT Goal Formulation: With patient Time For Goal Achievement: 12/26/14 Potential to Achieve Goals: Good Progress towards PT goals: Progressing toward goals    Frequency  7X/week    PT Plan Current plan remains appropriate    Co-evaluation             End of Session Equipment Utilized During Treatment: Gait belt;Left knee immobilizer Activity Tolerance: Patient tolerated treatment well Patient left: in bed;with call bell/phone within reach;with family/visitor present     Time: [id number] PT Time Calculation (min) (ACUTE ONLY):  30 min  Charges:  $Gait Training: 8-22 mins $Therapeutic Exercise: 8-22 mins                    G Codes:      Chauntae Hults 12/19/2014, 5:21 PM

## 2014-12-19 NOTE — Discharge Summary (Signed)
Physician Discharge Summary  Patient ID: Pamela Love Griffie MRN: [id number] DOB/AGE: 66/09/1950 66 y.o.  Admit date: 12/18/2014 Discharge date: 12/20/2014  Admission Diagnoses: left knee primary OA  Discharge Diagnoses:  Active Problems:   Primary osteoarthritis of left knee   S/P knee replacement   Discharged Condition: good  Hospital Course:  Pamela Love Lampert is a 64 y.o. who was [facility]. They were brought to the operating room on 12/18/2014 and underwent Procedure(s): TOTAL LEFT KNEE ARTHROPLASTY.  Patient tolerated the procedure well and was later transferred to the recovery room and then to the orthopaedic floor for postoperative care.  They were given PO and IV analgesics for pain control following their surgery.  They were given 24 hours of postoperative antibiotics of  Anti-infectives    Start     Dose/Rate Route Frequency Ordered Stop   12/18/14 2000  ceFAZolin (ANCEF) IVPB 2 g/50 mL premix     2 g 100 mL/hr over 30 Minutes Intravenous Every 6 hours 12/18/14 1756 12/19/14 0230   12/18/14 1055  ceFAZolin (ANCEF) IVPB 2 g/50 mL premix     2 g 100 mL/hr over 30 Minutes Intravenous On call to O.R. 12/18/14 1055 12/18/14 1401     and started on DVT prophylaxis in the form of lovenox.   PT and OT were ordered for total joint protocol.  Discharge planning consulted to help with postop disposition and equipment needs.  Patient had a good night on the evening of surgery and started to get up OOB with therapy on day one.  Hemovac drain was pulled without difficulty.  Continued to work with therapy into day two.  The patient had progressed with therapy and meeting their goals.  Incision was healing well.  Patient was seen in rounds and was ready to go home.  Consults: n/a  Significant Diagnostic Studies: n/a  Treatments: n/a  Discharge Exam: Blood pressure 108/59, pulse 74, temperature 98.1 F (36.7 C), temperature source Oral, resp. rate 16, height 5\' 3"  (1.6 m),  weight 88.083 kg (194 lb 3 oz), SpO2 100 %. Alert and oriented x3. RRR, Lungs clear, BS x4. Left Calf soft and non tender. L knee dressing C/Love/I. No DVT signs. No signs of infection or compartment syndrome. LLE grossly neurovascularly intact.   Disposition:   Discharge Instructions    Call MD / Call 911    Complete by:  As directed   If you experience chest pain or shortness of breath, CALL 911 and be transported to the hospital emergency room.  If you develope a fever above 101 F, pus (white drainage) or increased drainage or redness at the wound, or calf pain, call your surgeon's office.     Constipation Prevention    Complete by:  As directed   Drink plenty of fluids.  Prune juice may be helpful.  You may use a stool softener, such as Colace (over the counter) 100 mg twice a day.  Use MiraLax (over the counter) for constipation as needed.     Diet - low sodium heart healthy    Complete by:  As directed      Discharge instructions    Complete by:  As directed   INSTRUCTIONS AFTER JOINT REPLACEMENT   Remove items at home which could result in a fall. This includes throw rugs or furniture in walking pathways ICE to the affected joint every three hours while awake for 30 minutes at a time, for at least the first 3-5  days, and then as needed for pain and swelling.  Continue to use ice for pain and swelling. You may notice swelling that will progress down to the foot and ankle.  This is normal after surgery.  Elevate your leg when you are not up walking on it.   Continue to use the breathing machine you got in the hospital (incentive spirometer) which will help keep your temperature down.  It is common for your temperature to cycle up and down following surgery, especially at night when you are not up moving around and exerting yourself.  The breathing machine keeps your lungs expanded and your temperature down.   DIET:  As you were doing prior to hospitalization, we recommend a well-balanced  diet.  DRESSING / WOUND CARE / SHOWERING   Leave dressing in place, may remove the drain site dressing on the side. Place a band aid and neosporin over the site. May shower.  ACTIVITY  Increase activity slowly as tolerated, but follow the weight bearing instructions below.   No driving for 6 weeks or until further direction given by your physician.  You cannot drive while taking narcotics.  No lifting or carrying greater than 10 lbs. until further directed by your surgeon. Avoid periods of inactivity such as sitting longer than an hour when not asleep. This helps prevent blood clots.  You may return to work once you are authorized by your doctor.     WEIGHT BEARING   As tolerated, unless directed otherwise. Use assistive devices for safety.     EXERCISES  Results after joint replacement surgery are often greatly improved when you follow the exercise, range of motion and muscle strengthening exercises prescribed by your doctor. Safety measures are also important to protect the joint from further injury. Any time any of these exercises cause you to have increased pain or swelling, decrease what you are doing until you are comfortable again and then slowly increase them. If you have problems or questions, call your caregiver or physical therapist for advice.   Rehabilitation is important following a joint replacement. After just a few days of immobilization, the muscles of the leg can become weakened and shrink (atrophy).  These exercises are designed to build up the tone and strength of the thigh and leg muscles and to improve motion. Often times heat used for twenty to thirty minutes before working out will loosen up your tissues and help with improving the range of motion but do not use heat for the first two weeks following surgery (sometimes heat can increase post-operative swelling).   These exercises can be done on a training (exercise) mat, on the floor, on a table or on a bed. Use  whatever works the best and is most comfortable for you.    Use music or television while you are exercising so that the exercises are a pleasant break in your day. This will make your life better with the exercises acting as a break in your routine that you can look forward to.   Perform all exercises about fifteen times, three times per day or as directed.  You should exercise both the operative leg and the other leg as well.    Exercises include:   Quad Sets - Tighten up the muscle on the front of the thigh (Quad) and hold for 5-10 seconds.   Straight Leg Raises - With your knee straight (if you were given a brace, keep it on), lift the leg to 60 degrees, hold for  3 seconds, and slowly lower the leg.  Perform this exercise against resistance later as your leg gets stronger.  Leg Slides: Lying on your back, slowly slide your foot toward your buttocks, bending your knee up off the floor (only go as far as is comfortable). Then slowly slide your foot back down until your leg is flat on the floor again.  Angel Wings: Lying on your back spread your legs to the side as far apart as you can without causing discomfort.  Hamstring Strength:  Lying on your back, push your heel against the floor with your leg straight by tightening up the muscles of your buttocks.  Repeat, but this time bend your knee to a comfortable angle, and push your heel against the floor.  You may put a pillow under the heel to make it more comfortable if necessary.   A rehabilitation program following joint replacement surgery can speed recovery and prevent re-injury in the future due to weakened muscles. Contact your doctor or a physical therapist for more information on knee rehabilitation.    CONSTIPATION  Constipation is defined medically as fewer than three stools per week and severe constipation as less than one stool per week.  Even if you have a regular bowel pattern at home, your normal regimen is likely to be disrupted due  to multiple reasons following surgery.  Combination of anesthesia, postoperative narcotics, change in appetite and fluid intake all can affect your bowels.   YOU MUST use at least one of the following options; they are listed in order of increasing strength to get the job done.  They are all available over the counter, and you may need to use some, POSSIBLY even all of these options:    Drink plenty of fluids (prune juice may be helpful) and high fiber foods Colace 100 mg by mouth twice a day  Senokot for constipation as directed and as needed Dulcolax (bisacodyl), take with full glass of water  Miralax (polyethylene glycol) once or twice a day as needed.  If you have tried all these things and are unable to have a bowel movement in the first 3-4 days after surgery call either your surgeon or your primary doctor.    If you experience loose stools or diarrhea, hold the medications until you stool forms back up.  If your symptoms do not get better within 1 week or if they get worse, check with your doctor.  If you experience "the worst abdominal pain ever" or develop nausea or vomiting, please contact the office immediately for further recommendations for treatment.   ITCHING:  If you experience itching with your medications, try taking only a single pain pill, or even half a pain pill at a time.  You can also use Benadryl over the counter for itching or also to help with sleep.   TED HOSE STOCKINGS:  Use stockings on both legs until for at least 2 weeks or as directed by physician office. They may be removed at night for sleeping.  MEDICATIONS:  See your medication summary on the "After Visit Summary" that nursing will review with you.  You may have some home medications which will be placed on hold until you complete the course of blood thinner medication.  It is important for you to complete the blood thinner medication as prescribed.  PRECAUTIONS:  If you experience chest pain or shortness of  breath - call 911 immediately for transfer to the hospital emergency department.   If you develop  a fever greater that 101 F, purulent drainage from wound, increased redness or drainage from wound, foul odor from the wound/dressing, or calf pain - CONTACT YOUR SURGEON.                                                   FOLLOW-UP APPOINTMENTS:  If you do not already have a post-op appointment, please call the office [phone] for an appointment to be seen by your surgeon.  Guidelines for how soon to be seen are listed in your "After Visit Summary", but are typically between 1-4 weeks after surgery.  OTHER INSTRUCTIONS:   Knee Replacement:  Do not place pillow under knee, focus on keeping the knee straight while resting. CPM instructions: 0-90 degrees, 2 hours in the morning, 2 hours in the afternoon, and 2 hours in the evening. Place foam block, curve side up under heel at all times except when in CPM or when walking.  DO NOT modify, tear, cut, or change the foam block in any way.  MAKE SURE YOU:  Understand these instructions.  Get help right away if you are not doing well or get worse.    Thank you for letting us be a part of your medical care team.  It is a privilege we respect greatly.  We hope these instructions will help you stay on track for a fast and full recovery!     Do not put a pillow under the knee. Place it under the heel.    Complete by:  As directed      Increase activity slowly as tolerated    Complete by:  As directed      TED hose    Complete by:  As directed   Use stockings (TED hose) for 2 weeks on both leg(s).  You may remove them at night for sleeping.            Medication List    STOP taking these medications        acetaminophen 650 MG CR tablet  Commonly known as:  TYLENOL      TAKE these medications        aspirin EC 325 MG tablet  Take 1 tablet (325 mg total) by mouth 2 (two) times daily.     baclofen 10 MG tablet  Commonly known as:  LIORESAL   Take 1 tablet (10 mg total) by mouth 3 (three) times daily.     CALTRATE 600 PLUS-VIT Love PO  Take 1 tablet by mouth every other day.     CoQ10 200 MG Caps  Take 200 mg by mouth daily.     diclofenac sodium 1 % Gel  Commonly known as:  VOLTAREN  Apply 1 application topically at bedtime. Applied knees     DULoxetine 20 MG capsule  Commonly known as:  CYMBALTA  Take 40 mg by mouth at bedtime.     ferrous sulfate 325 (65 FE) MG tablet  Take 1 tablet (325 mg total) by mouth 3 (three) times daily after meals.     folic acid 800 MCG tablet  Commonly known as:  FOLVITE  Take 800 mcg by mouth daily.     GLUCOSAMINE CHONDROITIN ADV PO  Take 1 tablet by mouth 2 (two) times daily.     Insulin Glargine 100 UNIT/ML Solostar Pen  Commonly known  as:  LANTUS SOLOSTAR  Inject 22 Units into the skin every morning. And pen needles 1/day     multivitamin with minerals Tabs tablet  Take 1 tablet by mouth every other day.     omeprazole 20 MG capsule  Commonly known as:  PRILOSEC  Take 20 mg by mouth daily.     ONE TOUCH ULTRA TEST test strip  Generic drug:  glucose blood  Use to to check blood sugar 2 times per day.     oxyCODONE-acetaminophen 5-325 MG per tablet  Commonly known as:  ROXICET  Take 1-2 tablets by mouth every 4 (four) hours as needed for severe pain.     RA FISH OIL 1400 MG Cpdr  Take 1,400 mg by mouth 2 (two) times daily.     rosuvastatin 10 MG tablet  Commonly known as:  CRESTOR  Take 10 mg by mouth daily.     trospium 20 MG tablet  Commonly known as:  SANCTURA  Take 20 mg by mouth 2 (two) times daily.     valsartan-hydrochlorothiazide 320-25 MG per tablet  Commonly known as:  DIOVAN-HCT  Take 1 tablet by mouth daily.     Vitamin Love 2000 UNITS Caps  Take 2,000 Units by mouth daily.         SignedMarkham Jordan: Airyonna Franklyn L 12/19/2014, 7:55 AM

## 2014-12-19 NOTE — Evaluation (Signed)
Occupational Therapy Evaluation Patient Details Name: Pamela Love MRN: [id number] DOB: 12/23/1950 Today's Date: 12/19/2014    History of Present Illness L TKR   Clinical Impression   Patient evaluated by Occupational Therapy with no further acute OT needs identified. All education has been completed and the patient has no further questions. Pt is able to perform ADLs at min guard assist level. All education completed.  See below for any follow-up Occupational Therapy or equipment needs. OT is signing off. Thank you for this referral.      Follow Up Recommendations  No OT follow up;Supervision/Assistance - 24 hour    Equipment Recommendations  3 in 1 bedside comode    Recommendations for Other Services       Precautions / Restrictions Precautions Precautions: Knee;Fall Required Braces or Orthoses: Knee Immobilizer - Right Knee Immobilizer - Right: Discontinue once straight leg raise with < 10 degree lag Restrictions Weight Bearing Restrictions: No LLE Weight Bearing: Weight bearing as tolerated      Mobility Bed Mobility                  Transfers Overall transfer level: Needs assistance Equipment used: Rolling walker (2 wheeled) Transfers: Sit to/from [employer]GI CorporationStand;Stand Pivot Transfers Sit to Stand: Min guard Stand pivot transfers: Min guard            Balance                                            ADL Overall ADL's : Needs assistance/impaired Eating/Feeding: Independent;Sitting   Grooming: Wash/dry hands;Wash/dry face;Oral care;Min guard;Standing   Upper Body Bathing: Set up;Sitting   Lower Body Bathing: Min guard;Sit to/from stand   Upper Body Dressing : Set up;Sitting   Lower Body Dressing: Min guard;Sit to/from stand   Toilet Transfer: Min guard;Ambulation;Comfort height toilet;BSC;RW;Grab bars   Toileting- Clothing Manipulation and Hygiene: Min guard;Sit to/from stand   Tub/ Shower Transfer: Min  guard;Ambulation;Shower seat;3 in [employer];Rolling walker   Functional mobility during ADLs: Min guard;Rolling walker General ADL Comments: Pt was instructed in safety with ADLs.  Discussed removal of area rugs and use of bag or basket on walker to transport objects      Vision     Perception     Praxis      Pertinent Vitals/Pain Pain Assessment: 0-10 Pain Score: 2  Pain Location: Lt knee  Pain Descriptors / Indicators: Aching Pain Intervention(s): Monitored during session;Ice applied     Hand Dominance Right   Extremity/Trunk Assessment Upper Extremity Assessment Upper Extremity Assessment: Overall WFL for tasks assessed   Lower Extremity Assessment Lower Extremity Assessment: Defer to PT evaluation   Cervical / Trunk Assessment Cervical / Trunk Assessment: Normal   Communication Communication Communication: No difficulties   Cognition Arousal/Alertness: Awake/alert Behavior During Therapy: WFL for tasks assessed/performed Overall Cognitive Status: Within Functional Limits for tasks assessed                     General Comments       Exercises       Shoulder Instructions      Home Living Family/patient expects to be discharged to:: Private residence Living Arrangements: Spouse/significant other Available Help at Discharge: Family Type of Home: House Home Access: Stairs to enter Secretary/administratorntrance Stairs-Number of Steps: 3 Entrance Stairs-Rails: None Home Layout: Two level;Able to live on main level  with bedroom/bathroom Alternate Level Stairs-Number of Steps: 14 Alternate Level Stairs-Rails: Right Bathroom Shower/Tub: Producer, television/film/videoWalk-in shower   Bathroom Toilet: Standard Bathroom Accessibility: Yes How Accessible: Accessible via walker Home Equipment: Cane - single point          Prior Functioning/Environment Level of Independence: Independent;Independent with assistive device(s)             OT Diagnosis: Generalized weakness;Acute pain   OT Problem List:      OT Treatment/Interventions:      OT Goals(Current goals can be found in the care plan section) Acute Rehab OT Goals Patient Stated Goal: return to dancing  OT Goal Formulation: All assessment and education complete, DC therapy  OT Frequency:     Barriers to D/C:            Co-evaluation              End of Session Equipment Utilized During Treatment: Rolling walker;Left knee immobilizer Nurse Communication: Mobility status  Activity Tolerance: Patient tolerated treatment well Patient left: in chair;with call bell/phone within reach;with family/visitor present   Time: 1228-1249 OT Time Calculation (min): 21 min Charges:  OT General Charges $OT Visit: 1 Procedure OT Evaluation $Initial OT Evaluation Tier I: 1 Procedure G-Codes:    Jeani Hawkingonarpe, Aston Lawhorn M 12/19/2014, 12:55 PM

## 2014-12-19 NOTE — Evaluation (Signed)
Physical Therapy Evaluation Patient Details Name: Pamela Love MRN: [id number] DOB: 07/26/1951 Today's Date: 12/19/2014   History of Present Illness  L TKR  Clinical Impression  Pt s/p L TKR presents with decreased L LE strength/ROM and post op pain limiting functional mobility.  Pt should progress well to dc home with family assist and HHPT follow up.    Follow Up Recommendations Home health PT    Equipment Recommendations  Rolling walker with 5" wheels    Recommendations for Other Services OT consult     Precautions / Restrictions Precautions Precautions: Knee;Fall Required Braces or Orthoses: Knee Immobilizer - Right Knee Immobilizer - Right: Discontinue once straight leg raise with < 10 degree lag Restrictions Weight Bearing Restrictions: No LLE Weight Bearing: Weight bearing as tolerated      Mobility  Bed Mobility Overal bed mobility: Needs Assistance Bed Mobility: Supine to Sit     Supine to sit: Min assist     General bed mobility comments: cues for sequence and use of R LE to self assist  Transfers Overall transfer level: Needs assistance Equipment used: Rolling walker (2 wheeled) Transfers: Sit to/from Stand Sit to Stand: Min assist         General transfer comment: Cues for LE management and use of UEs to self assist  Ambulation/Gait Ambulation/Gait assistance: Min assist Ambulation Distance (Feet): 38 Feet Assistive device: Rolling walker (2 wheeled) Gait Pattern/deviations: Step-to pattern;Decreased step length - right;Decreased step length - left;Shuffle;Trunk flexed Gait velocity: decr   General Gait Details: Cues for posture, sequence and use of UEs to self assist  Stairs            Wheelchair Mobility    Modified Rankin (Stroke Patients Only)       Balance                                             Pertinent Vitals/Pain Pain Assessment: 0-10 Pain Score: 2  Pain Location: L knee Pain Descriptors /  Indicators: Aching;Sore Pain Intervention(s): Limited activity within patient's tolerance;Monitored during session;Premedicated before session;Ice applied    Home Living Family/patient expects to be discharged to:: Private residence Living Arrangements: Spouse/significant other Available Help at Discharge: Family Type of Home: House Home Access: Stairs to enter Entrance Stairs-Rails: None Entrance Stairs-Number of Steps: 3 Home Layout: Two level;Able to live on main level with bedroom/bathroom Home Equipment: Gilmer MorCane - single point      Prior Function Level of Independence: Independent;Independent with assistive device(s)               Hand Dominance   Dominant Hand: Right    Extremity/Trunk Assessment   Upper Extremity Assessment: Overall WFL for tasks assessed           Lower Extremity Assessment: RLE deficits/detail;LLE deficits/detail RLE Deficits / Details: strength WFL with AROM ~90 flex in sitting LLE Deficits / Details: 2+/5 quads with AAROM at knee -10 - 45  Cervical / Trunk Assessment: Normal  Communication   Communication: No difficulties  Cognition Arousal/Alertness: Awake/alert Behavior During Therapy: WFL for tasks assessed/performed Overall Cognitive Status: Within Functional Limits for tasks assessed                      General Comments      Exercises Total Joint Exercises Ankle Circles/Pumps: AROM;Both;10 reps;Supine Quad Sets: AROM;Both;10 reps;Supine Heel  Slides: AAROM;Left;15 reps;Supine Hip ABduction/ADduction: AAROM;Left;10 reps;Supine      Assessment/Plan    PT Assessment Patient needs continued PT services  PT Diagnosis Difficulty walking   PT Problem List Decreased strength;Decreased range of motion;Decreased activity tolerance;Decreased mobility;Decreased knowledge of use of DME;Pain  PT Treatment Interventions DME instruction;Gait training;Functional mobility training;Stair training;Therapeutic activities;Therapeutic  exercise;Patient/family education   PT Goals (Current goals can be found in the Care Plan section) Acute Rehab PT Goals Patient Stated Goal: Resume previous lifestyle with decreased pain PT Goal Formulation: With patient Time For Goal Achievement: 12/26/14 Potential to Achieve Goals: Good    Frequency 7X/week   Barriers to discharge        Co-evaluation               End of Session Equipment Utilized During Treatment: Gait belt;Left knee immobilizer Activity Tolerance: Patient tolerated treatment well Patient left: in chair;with call bell/phone within reach;with family/visitor present Nurse Communication: Mobility status         Time: [id number] PT Time Calculation (min) (ACUTE ONLY): 40 min   Charges:   PT Evaluation $Initial PT Evaluation Tier I: 1 Procedure PT Treatments $Gait Training: 8-22 mins $Therapeutic Exercise: 8-22 mins   PT G Codes:        Amantha Sklar 12/19/2014, 12:25 PM

## 2014-12-20 LAB — CBC
HEMATOCRIT: 32.1 % — AB (ref 36.0–46.0)
HEMOGLOBIN: 10.7 g/dL — AB (ref 12.0–15.0)
MCH: 29.3 pg (ref 26.0–34.0)
MCHC: 33.3 g/dL (ref 30.0–36.0)
MCV: 87.9 fL (ref 78.0–100.0)
Platelets: 212 10*3/uL (ref 150–400)
RBC: 3.65 MIL/uL — ABNORMAL LOW (ref 3.87–5.11)
RDW: 12.7 % (ref 11.5–15.5)
WBC: 13.1 10*3/uL — AB (ref 4.0–10.5)

## 2014-12-20 LAB — GLUCOSE, CAPILLARY: Glucose-Capillary: 130 mg/dL — ABNORMAL HIGH (ref 65–99)

## 2014-12-20 MED ORDER — ENOXAPARIN (LOVENOX) PATIENT EDUCATION KIT
PACK | Freq: Once | Status: DC
  Filled 2014-12-20: qty 1

## 2014-12-20 NOTE — Progress Notes (Signed)
Physical Therapy Treatment Patient Details Name: Pamela Love MRN: [id number] DOB: [birth year] Today's Date: 12/20/2014    History of Present Illness L TKR    PT Comments    Pt progressing well with mobility and eager for dc home  Follow Up Recommendations  Home health PT     Equipment Recommendations  Rolling walker with 5" wheels    Recommendations for Other Services OT consult     Precautions / Restrictions Precautions Precautions: Knee;Fall Required Braces or Orthoses: Knee Immobilizer - Right Knee Immobilizer - Right: Discontinue once straight leg raise with < 10 degree lag (Pt performed IND SLR this am) Restrictions Weight Bearing Restrictions: No LLE Weight Bearing: Weight bearing as tolerated    Mobility  Bed Mobility Overal bed mobility: Needs Assistance Bed Mobility: Supine to Sit     Supine to sit: Supervision     General bed mobility comments: cues for sequence and use of R LE to self assist  Transfers Overall transfer level: Needs assistance Equipment used: Rolling walker (2 wheeled) Transfers: Sit to/from Stand Sit to Stand: Supervision         General transfer comment: Cues for LE management and use of UEs to self assist  Ambulation/Gait Ambulation/Gait assistance: Min guard;Supervision Ambulation Distance (Feet): 123 Feet Assistive device: Rolling walker (2 wheeled) Gait Pattern/deviations: Step-to pattern;Decreased step length - right;Decreased step length - left;Shuffle;Trunk flexed Gait velocity: decr   General Gait Details: min cues for posture, sequence and use of UEs to self assist   Stairs Stairs: Yes Stairs assistance: Min assist Stair Management: No rails Number of Stairs: 6 General stair comments: 2 stairs 3x with husband assisting on third attempt.  Cues for sequence and foot/RW placement - written instructions provided  Wheelchair Mobility    Modified Rankin (Stroke Patients Only)       Balance                                    Cognition Arousal/Alertness: Awake/alert Behavior During Therapy: WFL for tasks assessed/performed Overall Cognitive Status: Within Functional Limits for tasks assessed                      Exercises Total Joint Exercises Ankle Circles/Pumps: AROM;Both;Supine;20 reps Quad Sets: AROM;Both;Supine;20 reps Heel Slides: AAROM;Left;Supine;20 reps Hip ABduction/ADduction: AAROM;Left;10 reps;Supine Straight Leg Raises: Supine;Left;20 reps;AAROM;AROM Goniometric ROM: AAROM at knee -10-  80    General Comments        Pertinent Vitals/Pain Pain Assessment: 0-10 Pain Score: 2  Pain Location: L knee Pain Descriptors / Indicators: Aching;Sore Pain Intervention(s): Limited activity within patient's tolerance;Monitored during session;Premedicated before session;Ice applied    Home Living                      Prior Function            PT Goals (current goals can now be found in the care plan section) Acute Rehab PT Goals Patient Stated Goal: return to dancing  PT Goal Formulation: With patient Time For Goal Achievement: 12/26/14 Potential to Achieve Goals: Good Progress towards PT goals: Progressing toward goals    Frequency  7X/week    PT Plan Current plan remains appropriate    Co-evaluation             End of Session Equipment Utilized During Treatment: Gait belt;Left knee immobilizer Activity Tolerance: Patient tolerated treatment well  Patient left: in chair;with call bell/phone within reach;with family/visitor present     Time: [id number] PT Time Calculation (min) (ACUTE ONLY): 32 min  Charges:  $Gait Training: 8-22 mins $Therapeutic Exercise: 8-22 mins                    G Codes:      Kady Toothaker 12/20/2014, 1:04 PM

## 2014-12-20 NOTE — Progress Notes (Signed)
   Subjective: 2 Days Post-Op Procedure(s) (LRB): TOTAL LEFT KNEE ARTHROPLASTY (Left) Patient reports pain as mild.   Patient seen in rounds with Dr. Darrelyn HillockGioffre. Patient is well, and has had no acute complaints or problems. Reports that she feels that she is improving. No issues overnight. No SOB or chest pain. Voiding well. Positive BM.   Objective: Vital signs in last 24 hours: Temp:  [97.7 F (36.5 C)-98.8 F (37.1 C)] 98.4 F (36.9 C) (05/21 0533) Pulse Rate:  [79-95] 80 (05/21 0533) Resp:  [14-16] 16 (05/21 0533) BP: (108-123)/(57-68) 119/68 mmHg (05/21 0533) SpO2:  [95 %-100 %] 95 % (05/21 0533)  Intake/Output from previous day:  Intake/Output Summary (Last 24 hours) at 12/20/14 0813 Last data filed at 12/20/14 0715  Gross per 24 hour  Intake   1080 ml  Output   2500 ml  Net  -1420 ml     Labs:  Recent Labs  12/19/14 0400 12/20/14 0445  HGB 11.7* 10.7*    Recent Labs  12/19/14 0400 12/20/14 0445  WBC 9.4 13.1*  RBC 4.00 3.65*  HCT 35.0* 32.1*  PLT 227 212    Recent Labs  12/19/14 0400  NA 139  K 4.7  CL 105  CO2 26  BUN 20  CREATININE 0.97  GLUCOSE 195*  CALCIUM 9.2    EXAM General - Patient is Alert and Oriented Extremity - Neurologically intact Intact pulses distally Dorsiflexion/Plantar flexion intact Compartment soft Dressing/Incision - clean, dry, no drainage Motor Function - intact, moving foot and toes well on exam.   Past Medical History  Diagnosis Date  . Hypertension   . Hyperlipidemia   . Shortness of breath dyspnea     with exertion  . Arthritis   . Bursitis     hips  . Osteoporosis   . History of nonmelanoma skin cancer   . GERD (gastroesophageal reflux disease)   . History of nephritis     as child  . OAB (overactive bladder)   . Diabetes mellitus without complication     diet controlled - has Rx for Glymipride but has not started taking yet  . Complication of anesthesia     "spinal did not work with second c  section"    Assessment/Plan: 2 Days Post-Op Procedure(s) (LRB): TOTAL LEFT KNEE ARTHROPLASTY (Left) Active Problems:   Primary osteoarthritis of left knee   S/P knee replacement  Estimated body mass index is 34.41 kg/(m^2) as calculated from the following:   Height as of this encounter: 5\' 3"  (1.6 m).   Weight as of this encounter: 88.083 kg (194 lb 3 oz). Advance diet Up with therapy D/C IV fluids Discharge home with home health  DVT Prophylaxis - Aspirin Weight-Bearing as tolerated  She is doing very well. Will do PT this morning before DC home this afternoon.  Dimitri PedAmber Dallyn Bergland, PA-C Orthopaedic Surgery 12/20/2014, 8:13 AM

## 2015-01-04 ENCOUNTER — Encounter: Payer: Self-pay | Admitting: Endocrinology

## 2015-02-05 ENCOUNTER — Encounter: Payer: Self-pay | Admitting: Endocrinology

## 2015-02-09 ENCOUNTER — Encounter: Payer: Self-pay | Admitting: Endocrinology

## 2015-02-09 ENCOUNTER — Ambulatory Visit (INDEPENDENT_AMBULATORY_CARE_PROVIDER_SITE_OTHER): Payer: Medicare Other | Admitting: Endocrinology

## 2015-02-09 VITALS — BP 110/76 | HR 107 | Temp 98.1°F | Resp 16 | Ht 63.0 in | Wt 183.0 lb

## 2015-02-09 DIAGNOSIS — E1121 Type 2 diabetes mellitus with diabetic nephropathy: Secondary | ICD-10-CM

## 2015-02-09 LAB — POCT GLYCOSYLATED HEMOGLOBIN (HGB A1C)

## 2015-02-09 MED ORDER — INSULIN GLARGINE 100 UNIT/ML SOLOSTAR PEN: 18.0000 [IU] | PEN_INJECTOR | SUBCUTANEOUS | Status: DC

## 2015-02-09 NOTE — Patient Instructions (Addendum)
Please reduce the lantus to 18 units each morning.  check your blood sugar twice a day.  vary the time of day when you check, between before the 3 meals, and at bedtime.  also check if you have symptoms of your blood sugar being too high or too low.  please keep a record of the readings and bring it to your next appointment here.  You can write it on any piece of paper.  please call us sooner if your blood sugar goes below 70, or if you have a lot of readings over 200.   Please come back for a follow-up appointment in 3 months.

## 2015-02-09 NOTE — Progress Notes (Signed)
Subjective:    Patient ID: Pamela Love, female    DOB: 05/02/1951, 64 y.o.   MRN: [id number]  HPI Pt returns for f/u of diabetes mellitus: DM type: Insulin-requiring type 2 Dx'ed: 2012 Complications: nephropathy Therapy: insulin since 2016 GDM: never DKA: never Severe hypoglycemia: never Pancreatitis: never Other: she wants to quickly improve control, so she can undergo TKR. Interval history: she brings a record of her cbg's which i have reviewed today.  It is in the low to mid-100's.  pt states she feels well in general.   Past Medical History  Diagnosis Date  . Hypertension   . Hyperlipidemia   . Shortness of breath dyspnea     with exertion  . Arthritis   . Bursitis     hips  . Osteoporosis   . History of nonmelanoma skin cancer   . GERD (gastroesophageal reflux disease)   . History of nephritis     as child  . OAB (overactive bladder)   . Diabetes mellitus without complication     diet controlled - has Rx for Glymipride but has not started taking yet  . Complication of anesthesia     "spinal did not work with second c section"    Past Surgical History  Procedure Laterality Date  . Cesarean section      x2  . Uterine fibroid surgery    . Abdominal hysterectomy  2005  . Eye surgery      bilat cataract surgery 2015  . Colonscopy     . Total knee arthroplasty Left 12/18/2014    Procedure: TOTAL LEFT KNEE ARTHROPLASTY;  Surgeon: Eugenia Mcalpineobert Collins, MD;  Location: [facility];  Service: Orthopedics;  Laterality: Left;    History   Social History  . Marital Status: Married    Spouse Name: N/A  . Number of Children: N/A  . Years of Education: N/A   Occupational History  . Not on file.   Social History Main Topics  . Smoking status: Never Smoker   . Smokeless tobacco: Never Used  . Alcohol Use: Yes     Comment: rare  . Drug Use: No  . Sexual Activity: Not on file   Other Topics Concern  . Not on file   Social History Narrative    Current Outpatient  Prescriptions on File Prior to Visit  Medication Sig Dispense Refill  . aspirin EC 325 MG tablet Take 1 tablet (325 mg total) by mouth 2 (two) times daily. 60 tablet 0  . baclofen (LIORESAL) 10 MG tablet Take 1 tablet (10 mg total) by mouth 3 (three) times daily. 50 each 2  . Calcium-Vitamin D (CALTRATE 600 PLUS-VIT D PO) Take 1 tablet by mouth every other day.    . Cholecalciferol (VITAMIN D) 2000 UNITS CAPS Take 2,000 Units by mouth daily.    . Coenzyme Q10 (COQ10) 200 MG CAPS Take 200 mg by mouth daily.    . diclofenac sodium (VOLTAREN) 1 % GEL Apply 1 application topically at bedtime. Applied knees    . DULoxetine (CYMBALTA) 20 MG capsule Take 40 mg by mouth at bedtime.     . ferrous sulfate 325 (65 FE) MG tablet Take 1 tablet (325 mg total) by mouth 3 (three) times daily after meals. 90 tablet 3  . folic acid (FOLVITE) 800 MCG tablet Take 800 mcg by mouth daily.    . Misc Natural Products (GLUCOSAMINE CHONDROITIN ADV PO) Take 1 tablet by mouth 2 (two) times daily.    .Marland Kitchen  Multiple Vitamin (MULTIVITAMIN WITH MINERALS) TABS tablet Take 1 tablet by mouth every other day.    . Omega-3 Fatty Acids (RA FISH OIL) 1400 MG CPDR Take 1,400 mg by mouth 2 (two) times daily.    Marland Kitchen omeprazole (PRILOSEC) 20 MG capsule Take 20 mg by mouth daily.    . ONE TOUCH ULTRA TEST test strip Use to to check blood sugar 2 times per day. 200 each 3  . oxyCODONE-acetaminophen (ROXICET) 5-325 MG per tablet Take 1-2 tablets by mouth every 4 (four) hours as needed for severe pain. 60 tablet 0  . rosuvastatin (CRESTOR) 10 MG tablet Take 10 mg by mouth daily.    . trospium (SANCTURA) 20 MG tablet Take 20 mg by mouth 2 (two) times daily.  3  . valsartan-hydrochlorothiazide (DIOVAN-HCT) 320-25 MG per tablet Take 1 tablet by mouth daily.     No current facility-administered medications on file prior to visit.    Allergies  Allergen Reactions  . Colchicine     Stomach cramps  . Diclofenac     Oral causes stomach cramps  .  Janumet [Sitagliptin-Metformin Hcl] Nausea And Vomiting    Lightheaded   . Septra [Sulfamethoxazole-Trimethoprim] Itching  . Trulicity [Dulaglutide] Diarrhea and Nausea And Vomiting  . Zetia [Ezetimibe]     Muscle pain  . Imipramine Rash  . Tape Rash    Paper tape ok to use     Family History  Problem Relation Age of Onset  . Diabetes Maternal Grandmother   . Diabetes Maternal Grandfather     BP 110/76 mmHg  Pulse 107  Temp(Src) 98.1 F (36.7 C) (Oral)  Resp 16  Ht  (1.6 m)  Wt 183 lb (83.008 kg)  BMI 32.43 kg/m2  SpO2 96%  Review of Systems She denies hypoglycemia.     Objective:   Physical Exam VITAL SIGNS:  See vs page GENERAL: no distress Pulses: dorsalis pedis intact bilat.   MSK: no deformity of the feet CV: no leg edema Skin:  no ulcer on the feet.  normal color and temp on the feet. Neuro: sensation is intact to touch on the feet.     A1c=6.1%    Assessment & Plan:  DM: overcontrolled: given this regimen, which does match insulin to her changing needs throughout the day.  i advised multiple daily injections, but she declines.    Patient is advised the following: Patient Instructions  Please reduce the lantus to 18 units each morning.  check your blood sugar twice a day.  vary the time of day when you check, between before the 3 meals, and at bedtime.  also check if you have symptoms of your blood sugar being too high or too low.  please keep a record of the readings and bring it to your next appointment here.  You can write it on any piece of paper.  please call us sooner if your blood sugar goes below 70, or if you have a lot of readings over 200.   Please come back for a follow-up appointment in 3 months.

## 2015-03-20 ENCOUNTER — Other Ambulatory Visit: Payer: Self-pay | Admitting: Orthopedic Surgery

## 2015-04-08 ENCOUNTER — Other Ambulatory Visit: Payer: Self-pay

## 2015-04-08 DIAGNOSIS — Z1231 Encounter for screening mammogram for malignant neoplasm of breast: Secondary | ICD-10-CM

## 2015-05-06 ENCOUNTER — Encounter: Payer: Self-pay | Admitting: Endocrinology

## 2015-05-06 NOTE — H&P (Signed)
TOTAL KNEE ADMISSION H&P  Patient is being admitted for right total knee arthroplasty.  Subjective:  Chief Complaint:right knee pain.  HPI: Pamela Love, 64 y.o. female, has a history of pain and functional disability in the right knee due to arthritis and has failed non-surgical conservative treatments for greater than 12 weeks to includeNSAID's and/or analgesics, corticosteriod injections, flexibility and strengthening excercises, supervised PT with diminished ADL's post treatment, use of assistive devices, weight reduction as appropriate and activity modification.  Onset of symptoms was gradual, starting 3 years ago with gradually worsening course since that time. The patient noted no past surgery on the right knee(s).  Patient currently rates pain in the right knee(s) at 7 out of 10 with activity. Patient has worsening of pain with activity and weight bearing, pain that interferes with activities of daily living, pain with passive range of motion and joint swelling.  Patient has evidence of subchondral sclerosis, periarticular osteophytes, joint subluxation and joint space narrowing by imaging studies. This patient has had Osteoarthritis. There is no active infection.  Patient Active Problem List   Diagnosis Date Noted  . Primary osteoarthritis of left knee 12/18/2014  . S/P knee replacement 12/18/2014  . Diabetes (HCC) 10/21/2014  . Osteoarthrosis, unspecified whether generalized or localized, involving lower leg 10/21/2014  . HTN (hypertension) 10/21/2014  . Dyslipidemia 10/21/2014   Past Medical History  Diagnosis Date  . Hypertension   . Hyperlipidemia   . Shortness of breath dyspnea     with exertion  . Arthritis   . Bursitis     hips  . Osteoporosis   . History of nonmelanoma skin cancer   . GERD (gastroesophageal reflux disease)   . History of nephritis     as child  . OAB (overactive bladder)   . Diabetes mellitus without complication     diet controlled - has Rx for  Glymipride but has not started taking yet  . Complication of anesthesia     "spinal did not work with second c section"    Past Surgical History  Procedure Laterality Date  . Cesarean section      x2  . Uterine fibroid surgery    . Abdominal hysterectomy  2005  . Eye surgery      bilat cataract surgery 2015  . Colonscopy     . Total knee arthroplasty Left 12/18/2014    Procedure: TOTAL LEFT KNEE ARTHROPLASTY;  Surgeon: Eugenia Mcalpine, MD;  Location: [facility];  Service: Orthopedics;  Laterality: Left;    No prescriptions prior to admission   Allergies  Allergen Reactions  . Colchicine     Stomach cramps  . Diclofenac     Oral causes stomach cramps  . Janumet [Sitagliptin-Metformin Hcl] Nausea And Vomiting    Lightheaded   . Septra [Sulfamethoxazole-Trimethoprim] Itching  . Trulicity [Dulaglutide] Diarrhea and Nausea And Vomiting  . Zetia [Ezetimibe]     Muscle pain  . Imipramine Rash  . Tape Rash    Paper tape ok to use     Social History  Substance Use Topics  . Smoking status: Never Smoker   . Smokeless tobacco: Never Used  . Alcohol Use: Yes     Comment: rare    Family History  Problem Relation Age of Onset  . Diabetes Maternal Grandmother   . Diabetes Maternal Grandfather      Review of Systems  Constitutional: Negative.   HENT: Negative.   Eyes: Negative.   Respiratory: Negative.   Cardiovascular: Negative.  Gastrointestinal: Negative.   Genitourinary: Negative.   Musculoskeletal: Positive for joint pain.  Skin: Negative.   Neurological: Negative.   Endo/Heme/Allergies: Negative.   Psychiatric/Behavioral: Negative.     Objective:  Physical Exam  Constitutional: She is oriented to person, place, and time. She appears well-developed.  HENT:  Head: Normocephalic.  Eyes: EOM are normal.  Neck: Normal range of motion.  Cardiovascular: Normal rate, normal heart sounds and intact distal pulses.   Respiratory: Effort normal.  GI: Soft.   Genitourinary:  Deferred  Musculoskeletal:  Right knee pain with ROM. RLE grossly n/v intact  Neurological: She is alert and oriented to person, place, and time.  Skin: Skin is warm and dry.  Psychiatric: Her behavior is normal.    Vital signs in last 24 hours:    Labs:   Estimated body mass index is 32.43 kg/(m^2) as calculated from the following:   Height as of 02/09/15:  (1.6 m).   Weight as of 02/09/15: 83.008 kg (183 lb).   Imaging Review Plain radiographs demonstrate moderate degenerative joint disease of the right knee(s). The overall alignment ismild varus. The bone quality appears to be good for age and reported activity level.  Assessment/Plan:  End stage arthritis, right knee   The patient history, physical examination, clinical judgment of the provider and imaging studies are consistent with end stage degenerative joint disease of the right knee(s) and total knee arthroplasty is deemed medically necessary. The treatment options including medical management, injection therapy arthroscopy and arthroplasty were discussed at length. The risks and benefits of total knee arthroplasty were presented and reviewed. The risks due to aseptic loosening, infection, stiffness, patella tracking problems, thromboembolic complications and other imponderables were discussed. The patient acknowledged the explanation, agreed to proceed with the plan and consent was signed. Patient is being admitted for inpatient treatment for surgery, pain control, PT, OT, prophylactic antibiotics, VTE prophylaxis, progressive ambulation and ADL's and discharge planning. The patient is planning to be discharged home with home health services.  Contraindications and adverse affects of Tranexamic acid discussed in detail. Patient denies any of these at this time and understands the risks and benefits.

## 2015-05-07 ENCOUNTER — Ambulatory Visit
Admission: RE | Admit: 2015-05-07 | Discharge: 2015-05-07 | Disposition: A | Discharge status: Home / Self Care | Payer: Medicare Other | Source: Ambulatory Visit

## 2015-05-07 DIAGNOSIS — Z1231 Encounter for screening mammogram for malignant neoplasm of breast: Secondary | ICD-10-CM

## 2015-05-07 NOTE — Patient Instructions (Addendum)
YOUR PROCEDURE IS SCHEDULED ON :  05/15/15  REPORT TO [facility] MAIN ENTRANCE FOLLOW SIGNS TO EAST ELEVATOR - GO TO 3rd FLOOR CHECK IN AT 3 EAST NURSES STATION (SHORT STAY) AT: 11:00 AM  CALL THIS NUMBER IF YOU HAVE PROBLEMS THE MORNING OF SURGERY [phone]  REMEMBER:ONLY 1 PER PERSON MAY GO TO SHORT STAY WITH YOU TO GET READY THE MORNING OF YOUR SURGERY  DO NOT EAT FOOD  AFTER MIDNIGHT  MAY HAVE CLEAR LIQUIDS UNTIL 7:00 AM  TAKE THESE MEDICINES THE MORNING OF SURGERY: PRILOSEC / CRESTOR / SANCTURA  CLEAR LIQUID DIET  Foods Allowed                                                                     Foods Excluded  Coffee and tea, regular and decaf                             liquids that you cannot  Plain Jell-O in any flavor                                             see through such as: Fruit ices (not with fruit pulp)                                     milk, soups, orange juice  Iced Popsicles                                                All solid food Carbonated beverages, regular and diet                                    Cranberry, grape and apple juices Sports drinks like Gatorade Lightly seasoned clear broth or consume(fat free) Sugar, honey syrup   _____________________________________________________________________    YOU MAY NOT HAVE ANY METAL ON YOUR BODY INCLUDING HAIR PINS AND PIERCING'S. DO NOT WEAR JEWELRY, MAKEUP, LOTIONS, POWDERS OR PERFUMES. DO NOT WEAR NAIL POLISH. DO NOT SHAVE 48 HRS PRIOR TO SURGERY. MEN MAY SHAVE FACE AND NECK.  DO NOT BRING VALUABLES TO HOSPITAL. [facility] IS NOT RESPONSIBLE FOR VALUABLES.  CONTACTS, DENTURES OR PARTIALS MAY NOT BE WORN TO SURGERY. LEAVE SUITCASE IN CAR. CAN BE BROUGHT TO ROOM AFTER SURGERY.  PATIENTS DISCHARGED THE DAY OF SURGERY WILL NOT BE ALLOWED TO DRIVE HOME.  PLEASE READ OVER THE FOLLOWING INSTRUCTION  SHEETS _________________________________________________________________________________                                          [facility] - PREPARING FOR SURGERY  Before surgery, you can play an important role.  Because skin is not  sterile, your skin needs to be as free of germs as possible.  You can reduce the number of germs on your skin by washing with CHG (chlorahexidine gluconate) soap before surgery.  CHG is an antiseptic cleaner which kills germs and bonds with the skin to continue killing germs even after washing. Please DO NOT use if you have an allergy to CHG or antibacterial soaps.  If your skin becomes reddened/irritated stop using the CHG and inform your nurse when you arrive at Short Stay. Do not shave (including legs and underarms) for at least 48 hours prior to the first CHG shower.  You may shave your face. Please follow these instructions carefully:   1.  Shower with CHG Soap the night before surgery and the  morning of Surgery.   2.  If you choose to wash your hair, wash your hair first as usual with your  normal  Shampoo.   3.  After you shampoo, rinse your hair and body thoroughly to remove the  shampoo.                                         4.  Use CHG as you would any other liquid soap.  You can apply chg directly  to the skin and wash . Gently wash with scrungie or clean wascloth    5.  Apply the CHG Soap to your body ONLY FROM THE NECK DOWN.   Do not use on open                           Wound or open sores. Avoid contact with eyes, ears mouth and genitals (private parts).                        Genitals (private parts) with your normal soap.              6.  Wash thoroughly, paying special attention to the area where your surgery  will be performed.   7.  Thoroughly rinse your body with warm water from the neck down.   8.  DO NOT shower/wash with your normal soap after using and rinsing off  the CHG Soap .                9.  Pat yourself dry with a clean  towel.             10.  Wear clean night clothes to bed after shower             11.  Place clean sheets on your bed the night of your first shower and do not  sleep with pets.  Day of Surgery : Do not apply any lotions/deodorants the morning of surgery.  Please wear clean clothes to the hospital/surgery center.  FAILURE TO FOLLOW THESE INSTRUCTIONS MAY RESULT IN THE CANCELLATION OF YOUR SURGERY    PATIENT SIGNATURE_________________________________  ______________________________________________________________________     Pamela Love  An incentive spirometer is a tool that can help keep your lungs clear and active. This tool measures how well you are filling your lungs with each breath. Taking long deep breaths may help reverse or decrease the chance of developing breathing (pulmonary) problems (especially infection) following:  A long period of time when you are unable to move or be active. BEFORE  THE PROCEDURE   If the spirometer includes an indicator to show your best effort, your nurse or respiratory therapist will set it to a desired goal.  If possible, sit up straight or lean slightly forward. Try not to slouch.  Hold the incentive spirometer in an upright position. INSTRUCTIONS FOR USE   Sit on the edge of your bed if possible, or sit up as far as you can in bed or on a chair.  Hold the incentive spirometer in an upright position.  Breathe out normally.  Place the mouthpiece in your mouth and seal your lips tightly around it.  Breathe in slowly and as deeply as possible, raising the piston or the ball toward the top of the column.  Hold your breath for 3-5 seconds or for as long as possible. Allow the piston or ball to fall to the bottom of the column.  Remove the mouthpiece from your mouth and breathe out normally.  Rest for a few seconds and repeat Steps 1 through 7 at least 10 times every 1-2 hours when you are awake. Take your time and take a few  normal breaths between deep breaths.  The spirometer may include an indicator to show your best effort. Use the indicator as a goal to work toward during each repetition.  After each set of 10 deep breaths, practice coughing to be sure your lungs are clear. If you have an incision (the cut made at the time of surgery), support your incision when coughing by placing a pillow or rolled up towels firmly against it. Once you are able to get out of bed, walk around indoors and cough well. You may stop using the incentive spirometer when instructed by your caregiver.  RISKS AND COMPLICATIONS  Take your time so you do not get dizzy or light-headed.  If you are in pain, you may need to take or ask for pain medication before doing incentive spirometry. It is harder to take a deep breath if you are having pain. AFTER USE  Rest and breathe slowly and easily.  It can be helpful to keep track of a log of your progress. Your caregiver can provide you with a simple table to help with this. If you are using the spirometer at home, follow these instructions: [place] IF:   You are having difficultly using the spirometer.  You have trouble using the spirometer as often as instructed.  Your pain medication is not giving enough relief while using the spirometer.  You develop fever of 100.5 F (38.1 C) or higher. SEEK IMMEDIATE MEDICAL CARE IF:   You cough up bloody sputum that had not been present before.  You develop fever of 102 F (38.9 C) or greater.  You develop worsening pain at or near the incision site. MAKE SURE YOU:   Understand these instructions.  Will watch your condition.  Will get help right away if you are not doing well or get worse. Document Released: 11/28/2006 Document Revised: 10/10/2011 Document Reviewed: 01/29/2007 Exit[employer] Patient Information 2014 [employer], [place].   ________________________________________________________________________  WHAT IS A BLOOD  TRANSFUSION? Blood Transfusion Information  A transfusion is the replacement of blood or some of its parts. Blood is made up of multiple cells which provide different functions.  Red blood cells carry oxygen and are used for blood loss replacement.  White blood cells fight against infection.  Platelets control bleeding.  Plasma helps clot blood.  Other blood products are available for specialized needs, such as  hemophilia or other clotting disorders. BEFORE THE TRANSFUSION  Who gives blood for transfusions?   Healthy volunteers who are fully evaluated to make sure their blood is safe. This is blood bank blood. Transfusion therapy is the safest it has ever been in the practice of medicine. Before blood is taken from a donor, a complete history is taken to make sure that person has no history of diseases nor engages in risky social behavior (examples are intravenous drug use or sexual activity with multiple partners). The donor's travel history is screened to minimize risk of transmitting infections, such as malaria. The donated blood is tested for signs of infectious diseases, such as HIV and hepatitis. The blood is then tested to be sure it is compatible with you in order to minimize the chance of a transfusion reaction. If you or a relative donates blood, this is often done in anticipation of surgery and is not appropriate for emergency situations. It takes many days to process the donated blood. RISKS AND COMPLICATIONS Although transfusion therapy is very safe and saves many lives, the main dangers of transfusion include:   Getting an infectious disease.  Developing a transfusion reaction. This is an allergic reaction to something in the blood you were given. Every precaution is taken to prevent this. The decision to have a blood transfusion has been considered carefully by your caregiver before blood is given. Blood is not given unless the benefits outweigh the risks. AFTER THE  TRANSFUSION  Right after receiving a blood transfusion, you will usually feel much better and more energetic. This is especially true if your red blood cells have gotten low (anemic). The transfusion raises the level of the red blood cells which carry oxygen, and this usually causes an energy increase.  The nurse administering the transfusion will monitor you carefully for complications. HOME CARE INSTRUCTIONS  No special instructions are needed after a transfusion. You may find your energy is better. Speak with your caregiver about any limitations on activity for underlying diseases you may have. SEEK MEDICAL CARE IF:   Your condition is not improving after your transfusion.  You develop redness or irritation at the intravenous (IV) site. SEEK IMMEDIATE MEDICAL CARE IF:  Any of the following symptoms occur over the next 12 hours:  Shaking chills.  You have a temperature by mouth above 102 F (38.9 C), not controlled by medicine.  Chest, back, or muscle pain.  People around you feel you are not acting correctly or are confused.  Shortness of breath or difficulty breathing.  Dizziness and fainting.  You get a rash or develop hives.  You have a decrease in urine output.  Your urine turns a dark color or changes to pink, red, or brown. Any of the following symptoms occur over the next 10 days:  You have a temperature by mouth above 102 F (38.9 C), not controlled by medicine.  Shortness of breath.  Weakness after normal activity.  The white part of the eye turns yellow (jaundice).  You have a decrease in the amount of urine or are urinating less often.  Your urine turns a dark color or changes to pink, red, or brown. Document Released: 07/15/2000 Document Revised: 10/10/2011 Document Reviewed: 03/03/2008 [facility] Patient Information 2014 [place], [place].  _______________________________________________________________________

## 2015-05-08 ENCOUNTER — Encounter (HOSPITAL_COMMUNITY): Payer: Self-pay

## 2015-05-08 ENCOUNTER — Ambulatory Visit (INDEPENDENT_AMBULATORY_CARE_PROVIDER_SITE_OTHER): Payer: Medicare Other | Admitting: Endocrinology

## 2015-05-08 ENCOUNTER — Encounter (HOSPITAL_COMMUNITY)
Admission: RE | Admit: 2015-05-08 | Discharge: 2015-05-08 | Disposition: A | Discharge status: Home / Self Care | Payer: Medicare Other | Source: Ambulatory Visit | Attending: Specialist | Admitting: Specialist

## 2015-05-08 ENCOUNTER — Encounter: Payer: Self-pay | Admitting: Endocrinology

## 2015-05-08 VITALS — BP 136/87 | HR 96 | Temp 98.6°F | Ht 64.0 in | Wt 185.0 lb

## 2015-05-08 DIAGNOSIS — E1121 Type 2 diabetes mellitus with diabetic nephropathy: Secondary | ICD-10-CM

## 2015-05-08 DIAGNOSIS — M179 Osteoarthritis of knee, unspecified: Secondary | ICD-10-CM | POA: Insufficient documentation

## 2015-05-08 DIAGNOSIS — Z01818 Encounter for other preprocedural examination: Secondary | ICD-10-CM | POA: Diagnosis not present

## 2015-05-08 HISTORY — DX: Personal history of other malignant neoplasm of skin: Z85.828

## 2015-05-08 LAB — BASIC METABOLIC PANEL
ANION GAP: 6 (ref 5–15)
BUN: 24 mg/dL — ABNORMAL HIGH (ref 6–20)
CHLORIDE: 103 mmol/L (ref 101–111)
CO2: 29 mmol/L (ref 22–32)
CREATININE: 0.82 mg/dL (ref 0.44–1.00)
Calcium: 9.4 mg/dL (ref 8.9–10.3)
GFR calc non Af Amer: >60 mL/min (ref >60)
Glucose, Bld: 114 mg/dL — ABNORMAL HIGH (ref 65–99)
POTASSIUM: 3.7 mmol/L (ref 3.5–5.1)
SODIUM: 138 mmol/L (ref 135–145)

## 2015-05-08 LAB — CBC
HCT: 42.2 % (ref 36.0–46.0)
HEMOGLOBIN: 14 g/dL (ref 12.0–15.0)
MCH: 28.3 pg (ref 26.0–34.0)
MCHC: 33.2 g/dL (ref 30.0–36.0)
MCV: 85.3 fL (ref 78.0–100.0)
PLATELETS: 241 10*3/uL (ref 150–400)
RBC: 4.95 MIL/uL (ref 3.87–5.11)
RDW: 13.9 % (ref 11.5–15.5)
WBC: 9.2 10*3/uL (ref 4.0–10.5)

## 2015-05-08 LAB — URINALYSIS, ROUTINE W REFLEX MICROSCOPIC
BILIRUBIN URINE: NEGATIVE
Glucose, UA: NEGATIVE mg/dL
KETONES UR: NEGATIVE mg/dL
Leukocytes, UA: NEGATIVE
Nitrite: NEGATIVE
PH: 5.5 (ref 5.0–8.0)
Protein, ur: NEGATIVE mg/dL
SPECIFIC GRAVITY, URINE: 1.014 (ref 1.005–1.030)
UROBILINOGEN UA: 0.2 mg/dL (ref 0.0–1.0)

## 2015-05-08 LAB — SURGICAL PCR SCREEN
MRSA, PCR: NEGATIVE
Staphylococcus aureus: NEGATIVE

## 2015-05-08 LAB — APTT: aPTT: 30 seconds (ref 24–37)

## 2015-05-08 LAB — PROTIME-INR
INR: 1 (ref 0.00–1.49)
Prothrombin Time: 13.4 seconds (ref 11.6–15.2)

## 2015-05-08 LAB — URINE MICROSCOPIC-ADD ON

## 2015-05-08 LAB — POCT GLYCOSYLATED HEMOGLOBIN (HGB A1C): Hemoglobin A1C: 6.5

## 2015-05-08 MED ORDER — INSULIN GLARGINE 100 UNIT/ML SOLOSTAR PEN: 14.0000 [IU] | PEN_INJECTOR | SUBCUTANEOUS | Status: DC

## 2015-05-08 NOTE — Patient Instructions (Addendum)
Please reduce the lantus to 14 units each morning.  check your blood sugar twice a day.  vary the time of day when you check, between before the 3 meals, and at bedtime.  also check if you have symptoms of your blood sugar being too high or too low.  please keep a record of the readings and bring it to your next appointment here.  You can write it on any piece of paper.  please call us sooner if your blood sugar goes below 70, or if you have a lot of readings over 200.   Please come back for a follow-up appointment in 6 weeks.

## 2015-05-08 NOTE — Progress Notes (Signed)
Subjective:    Patient ID: Pamela Love, female    DOB: 03-02-1951, 64 y.o.   MRN: [id number]  HPI Pt returns for f/u of diabetes mellitus: DM type: Insulin-requiring type 2 Dx'ed: 2012 Complications: nephropathy Therapy: insulin since 2016 GDM: never DKA: never Severe hypoglycemia: never Pancreatitis: never Other: she wants to continue qd insulin. Interval history: she brings a record of her cbg's which i have reviewed today.  It is in the low to mid-100's.  pt states she feels well in general.   Past Medical History  Diagnosis Date  . Hypertension   . Hyperlipidemia   . Arthritis   . Bursitis     hips  . Osteoporosis   . History of nonmelanoma skin cancer   . GERD (gastroesophageal reflux disease)   . OAB (overactive bladder)   . Diabetes mellitus without complication (HCC)     diet controlled - has Rx for Glymipride but has not started taking yet  . Complication of anesthesia     "spinal did not work with second c section"  . History of skin cancer     Past Surgical History  Procedure Laterality Date  . Cesarean section      x2  . Uterine fibroid surgery    . Abdominal hysterectomy  2005  . Eye surgery      bilat cataract surgery 2015  . Colonscopy     . Total knee arthroplasty Left 12/18/2014    Procedure: TOTAL LEFT KNEE ARTHROPLASTY;  Surgeon: Eugenia Mcalpine, MD;  Location: [facility] ORS;  Service: Orthopedics;  Laterality: Left;    Social History   Social History  . Marital Status: Married    Spouse Name: N/A  . Number of Children: N/A  . Years of Education: N/A   Occupational History  . Not on file.   Social History Main Topics  . Smoking status: Never Smoker   . Smokeless tobacco: Never Used  . Alcohol Use: Yes     Comment: rare  . Drug Use: No  . Sexual Activity: Not on file   Other Topics Concern  . Not on file   Social History Narrative    Current Outpatient Prescriptions on File Prior to Visit  Medication Sig Dispense Refill  .  acetaminophen (TYLENOL ARTHRITIS PAIN) 650 MG CR tablet Take 1,300 mg by mouth at bedtime as needed for pain.    . Calcium-Vitamin D (CALTRATE 600 PLUS-VIT D PO) Take 1 tablet by mouth every morning.     . Cholecalciferol (VITAMIN D) 2000 UNITS CAPS Take 2,000 Units by mouth every morning.     . Coenzyme Q10 (COQ10) 200 MG CAPS Take 200 mg by mouth at bedtime.     . diclofenac sodium (VOLTAREN) 1 % GEL Apply 1 application topically at bedtime as needed (knee pain.). Applied knees    . DULoxetine (CYMBALTA) 20 MG capsule Take 40 mg by mouth at bedtime.     . folic acid (FOLVITE) 800 MCG tablet Take 800 mcg by mouth at bedtime.     . Multiple Vitamin (MULTIVITAMIN WITH MINERALS) TABS tablet Take 1 tablet by mouth every morning.     . Omega-3 Fatty Acids (RA FISH OIL) 1400 MG CPDR Take 1,400 mg by mouth 2 (two) times daily.    Marland Kitchen omeprazole (PRILOSEC) 20 MG capsule Take 20 mg by mouth every morning.     . ONE TOUCH ULTRA TEST test strip Use to to check blood sugar 2 times per day. 200  each 3  . rosuvastatin (CRESTOR) 10 MG tablet Take 10 mg by mouth every morning.     . trospium (SANCTURA) 20 MG tablet Take 20 mg by mouth 2 (two) times daily.  3  . valsartan-hydrochlorothiazide (DIOVAN-HCT) 320-25 MG per tablet Take 0.5 tablets by mouth every morning.      No current facility-administered medications on file prior to visit.    Allergies  Allergen Reactions  . Colchicine     Stomach cramps  . Diclofenac     Oral causes stomach cramps  . Janumet [Sitagliptin-Metformin Hcl] Nausea And Vomiting    Lightheaded   . Septra [Sulfamethoxazole-Trimethoprim] Itching  . Trulicity [Dulaglutide] Diarrhea and Nausea And Vomiting  . Zetia [Ezetimibe]     Muscle pain  . Imipramine Rash  . Tape Rash    Paper tape ok to use     Family History  Problem Relation Age of Onset  . Diabetes Maternal Grandmother   . Diabetes Maternal Grandfather     BP 136/87 mmHg  Pulse 96  Temp(Src) 98.6 F (37 C)  (Oral)  Ht  (1.626 m)  Wt 185 lb (83.915 kg)  BMI 31.74 kg/m2  SpO2 96%  Review of Systems She denies hypoglycemia    Objective:   Physical Exam VITAL SIGNS:  See vs page GENERAL: no distress Pulses: dorsalis pedis intact bilat.   MSK: no deformity of the feet CV: no leg edema Skin:  no ulcer on the feet.  normal color and temp on the feet. Neuro: sensation is intact to touch on the feet.    A1c=6.5%    Assessment & Plan:  DM: overcontrolled, given this regimen, which does match insulin to her changing needs throughout the day.  She is probably manageable with orals, but we'll address that after her other TKR.  Patient is advised the following: Patient Instructions  Please reduce the lantus to 14 units each morning.  check your blood sugar twice a day.  vary the time of day when you check, between before the 3 meals, and at bedtime.  also check if you have symptoms of your blood sugar being too high or too low.  please keep a record of the readings and bring it to your next appointment here.  You can write it on any piece of paper.  please call us sooner if your blood sugar goes below 70, or if you have a lot of readings over 200.   Please come back for a follow-up appointment in 6 weeks.

## 2015-05-15 ENCOUNTER — Encounter (HOSPITAL_COMMUNITY)
Admission: RE | Disposition: A | Discharge status: Home Health | Payer: Self-pay | Source: Ambulatory Visit | Attending: Specialist

## 2015-05-15 ENCOUNTER — Inpatient Hospital Stay (HOSPITAL_COMMUNITY)
Admission: RE | Admit: 2015-05-15 | Discharge: 2015-05-17 | DRG: 470 | Disposition: A | Discharge status: Home Health | Payer: Medicare Other | Source: Ambulatory Visit | Attending: Specialist | Admitting: Specialist

## 2015-05-15 ENCOUNTER — Encounter (HOSPITAL_COMMUNITY): Payer: Self-pay | Admitting: Anesthesiology

## 2015-05-15 ENCOUNTER — Inpatient Hospital Stay (HOSPITAL_COMMUNITY): Payer: Medicare Other | Admitting: Anesthesiology

## 2015-05-15 DIAGNOSIS — Z833 Family history of diabetes mellitus: Secondary | ICD-10-CM

## 2015-05-15 DIAGNOSIS — Z01812 Encounter for preprocedural laboratory examination: Secondary | ICD-10-CM | POA: Diagnosis not present

## 2015-05-15 DIAGNOSIS — Z90710 Acquired absence of both cervix and uterus: Secondary | ICD-10-CM | POA: Diagnosis not present

## 2015-05-15 DIAGNOSIS — M1711 Unilateral primary osteoarthritis, right knee: Secondary | ICD-10-CM

## 2015-05-15 DIAGNOSIS — K219 Gastro-esophageal reflux disease without esophagitis: Secondary | ICD-10-CM | POA: Diagnosis present

## 2015-05-15 DIAGNOSIS — Z96659 Presence of unspecified artificial knee joint: Secondary | ICD-10-CM

## 2015-05-15 DIAGNOSIS — E119 Type 2 diabetes mellitus without complications: Secondary | ICD-10-CM | POA: Diagnosis present

## 2015-05-15 DIAGNOSIS — M810 Age-related osteoporosis without current pathological fracture: Secondary | ICD-10-CM | POA: Diagnosis present

## 2015-05-15 DIAGNOSIS — Z96652 Presence of left artificial knee joint: Secondary | ICD-10-CM | POA: Diagnosis present

## 2015-05-15 DIAGNOSIS — I10 Essential (primary) hypertension: Secondary | ICD-10-CM | POA: Diagnosis present

## 2015-05-15 DIAGNOSIS — M25561 Pain in right knee: Secondary | ICD-10-CM | POA: Diagnosis present

## 2015-05-15 DIAGNOSIS — E785 Hyperlipidemia, unspecified: Secondary | ICD-10-CM | POA: Diagnosis present

## 2015-05-15 HISTORY — PX: TOTAL KNEE ARTHROPLASTY: SHX125

## 2015-05-15 HISTORY — DX: Presence of unspecified artificial knee joint: Z96.659

## 2015-05-15 HISTORY — DX: Unilateral primary osteoarthritis, right knee: M17.11

## 2015-05-15 LAB — TYPE AND SCREEN
ABO/RH(D): B POS
ANTIBODY SCREEN: NEGATIVE

## 2015-05-15 LAB — GLUCOSE, CAPILLARY
GLUCOSE-CAPILLARY: 99 mg/dL (ref 65–99)
GLUCOSE-CAPILLARY: 125 mg/dL — AB (ref 65–99)
GLUCOSE-CAPILLARY: 184 mg/dL — AB (ref 65–99)
Glucose-Capillary: 139 mg/dL — ABNORMAL HIGH (ref 65–99)

## 2015-05-15 SURGERY — ARTHROPLASTY, KNEE, TOTAL
Anesthesia: Spinal | Site: Knee | Laterality: Right

## 2015-05-15 MED ORDER — VALSARTAN-HYDROCHLOROTHIAZIDE 320-25 MG PO TABS: 0.5000 | ORAL_TABLET | Freq: Every morning | ORAL | Status: DC

## 2015-05-15 MED ORDER — ONDANSETRON HCL 4 MG/2ML IJ SOLN
INTRAMUSCULAR | Status: AC
  Filled 2015-05-15: qty 2

## 2015-05-15 MED ORDER — EPHEDRINE SULFATE 50 MG/ML IJ SOLN
INTRAMUSCULAR | Status: DC | PRN
  Administered 2015-05-15 (×2): 5 mg via INTRAVENOUS

## 2015-05-15 MED ORDER — CEFAZOLIN SODIUM-DEXTROSE 2-3 GM-% IV SOLR
INTRAVENOUS | Status: AC
  Filled 2015-05-15: qty 50

## 2015-05-15 MED ORDER — IRBESARTAN 150 MG PO TABS
150.0000 mg | ORAL_TABLET | Freq: Every day | ORAL | Status: DC
  Administered 2015-05-15 – 2015-05-17 (×3): 150 mg via ORAL
  Filled 2015-05-15 (×3): qty 1

## 2015-05-15 MED ORDER — HYDROMORPHONE HCL 1 MG/ML IJ SOLN
1.0000 mg | INTRAMUSCULAR | Status: DC | PRN
  Administered 2015-05-16: 1 mg via INTRAVENOUS
  Filled 2015-05-15: qty 1

## 2015-05-15 MED ORDER — HYDROMORPHONE HCL 1 MG/ML IJ SOLN
INTRAMUSCULAR | Status: AC
  Administered 2015-05-16: 1 mg via INTRAVENOUS
  Filled 2015-05-15: qty 1

## 2015-05-15 MED ORDER — DIPHENHYDRAMINE HCL 12.5 MG/5ML PO ELIX: 12.5000 mg | ORAL_SOLUTION | ORAL | Status: DC | PRN

## 2015-05-15 MED ORDER — ZOLPIDEM TARTRATE 5 MG PO TABS: 5.0000 mg | ORAL_TABLET | Freq: Every evening | ORAL | Status: DC | PRN

## 2015-05-15 MED ORDER — OXYCODONE HCL 5 MG PO TABS
5.0000 mg | ORAL_TABLET | ORAL | Status: DC | PRN
  Administered 2015-05-15 – 2015-05-17 (×8): 10 mg via ORAL
  Filled 2015-05-15 (×8): qty 2

## 2015-05-15 MED ORDER — ONDANSETRON HCL 4 MG PO TABS: 4.0000 mg | ORAL_TABLET | Freq: Four times a day (QID) | ORAL | Status: DC | PRN

## 2015-05-15 MED ORDER — COQ10 200 MG PO CAPS: 200.0000 mg | ORAL_CAPSULE | Freq: Every day | ORAL | Status: DC

## 2015-05-15 MED ORDER — DEXAMETHASONE SODIUM PHOSPHATE 10 MG/ML IJ SOLN
10.0000 mg | Freq: Once | INTRAMUSCULAR | Status: AC
  Administered 2015-05-16: 10 mg via INTRAVENOUS
  Filled 2015-05-15: qty 1

## 2015-05-15 MED ORDER — PHENYLEPHRINE 40 MCG/ML (10ML) SYRINGE FOR IV PUSH (FOR BLOOD PRESSURE SUPPORT)
PREFILLED_SYRINGE | INTRAVENOUS | Status: AC
  Filled 2015-05-15: qty 10

## 2015-05-15 MED ORDER — PHENOL 1.4 % MT LIQD
1.0000 | OROMUCOSAL | Status: DC | PRN
  Filled 2015-05-15: qty 177

## 2015-05-15 MED ORDER — TRANEXAMIC ACID 1000 MG/10ML IV SOLN
1000.0000 mg | INTRAVENOUS | Status: AC
  Administered 2015-05-15: 1000 mg via INTRAVENOUS
  Filled 2015-05-15: qty 10

## 2015-05-15 MED ORDER — BUPIVACAINE IN DEXTROSE 0.75-8.25 % IT SOLN
INTRATHECAL | Status: DC | PRN
  Administered 2015-05-15: 1.8 mL via INTRATHECAL

## 2015-05-15 MED ORDER — PANTOPRAZOLE SODIUM 40 MG PO TBEC
40.0000 mg | DELAYED_RELEASE_TABLET | Freq: Every day | ORAL | Status: DC
  Administered 2015-05-16 – 2015-05-17 (×2): 40 mg via ORAL
  Filled 2015-05-15 (×2): qty 1

## 2015-05-15 MED ORDER — SODIUM CHLORIDE 0.9 % IJ SOLN
INTRAMUSCULAR | Status: AC
  Filled 2015-05-15: qty 10

## 2015-05-15 MED ORDER — MENTHOL 3 MG MT LOZG: 1.0000 | LOZENGE | OROMUCOSAL | Status: DC | PRN

## 2015-05-15 MED ORDER — METOCLOPRAMIDE HCL 5 MG/ML IJ SOLN: 5.0000 mg | Freq: Three times a day (TID) | INTRAMUSCULAR | Status: DC | PRN

## 2015-05-15 MED ORDER — DEXAMETHASONE SODIUM PHOSPHATE 10 MG/ML IJ SOLN
INTRAMUSCULAR | Status: AC
  Filled 2015-05-15: qty 1

## 2015-05-15 MED ORDER — POLYETHYLENE GLYCOL 3350 17 G PO PACK: 17.0000 g | PACK | Freq: Every day | ORAL | Status: DC | PRN

## 2015-05-15 MED ORDER — ONDANSETRON HCL 4 MG/2ML IJ SOLN: 4.0000 mg | Freq: Four times a day (QID) | INTRAMUSCULAR | Status: DC | PRN

## 2015-05-15 MED ORDER — SODIUM CHLORIDE 0.9 % IJ SOLN
INTRAMUSCULAR | Status: DC | PRN
Start: 2015-05-15 — End: 2015-05-15
  Administered 2015-05-15: 30 mL

## 2015-05-15 MED ORDER — EPHEDRINE SULFATE 50 MG/ML IJ SOLN
INTRAMUSCULAR | Status: AC
  Filled 2015-05-15: qty 1

## 2015-05-15 MED ORDER — SODIUM CHLORIDE 0.9 % IJ SOLN
INTRAMUSCULAR | Status: AC
  Filled 2015-05-15: qty 50

## 2015-05-15 MED ORDER — METOCLOPRAMIDE HCL 10 MG PO TABS: 5.0000 mg | ORAL_TABLET | Freq: Three times a day (TID) | ORAL | Status: DC | PRN

## 2015-05-15 MED ORDER — ALUM & MAG HYDROXIDE-SIMETH 200-200-20 MG/5ML PO SUSP: 30.0000 mL | ORAL | Status: DC | PRN

## 2015-05-15 MED ORDER — ENOXAPARIN SODIUM 30 MG/0.3ML ~~LOC~~ SOLN
30.0000 mg | Freq: Two times a day (BID) | SUBCUTANEOUS | Status: DC
  Administered 2015-05-16 – 2015-05-17 (×3): 30 mg via SUBCUTANEOUS
  Filled 2015-05-15 (×5): qty 0.3

## 2015-05-15 MED ORDER — METHOCARBAMOL 1000 MG/10ML IJ SOLN
500.0000 mg | Freq: Four times a day (QID) | INTRAVENOUS | Status: DC | PRN
  Administered 2015-05-15: 500 mg via INTRAVENOUS
  Filled 2015-05-15 (×2): qty 5

## 2015-05-15 MED ORDER — HYDROMORPHONE HCL 1 MG/ML IJ SOLN
0.2500 mg | INTRAMUSCULAR | Status: DC | PRN
  Administered 2015-05-15 (×4): 0.5 mg via INTRAVENOUS

## 2015-05-15 MED ORDER — CEFAZOLIN SODIUM-DEXTROSE 2-3 GM-% IV SOLR
2.0000 g | INTRAVENOUS | Status: AC
  Administered 2015-05-15: 2 g via INTRAVENOUS

## 2015-05-15 MED ORDER — ACETAMINOPHEN 325 MG PO TABS
650.0000 mg | ORAL_TABLET | Freq: Four times a day (QID) | ORAL | Status: DC | PRN
  Filled 2015-05-15: qty 2

## 2015-05-15 MED ORDER — DULOXETINE HCL 20 MG PO CPEP
40.0000 mg | ORAL_CAPSULE | Freq: Every day | ORAL | Status: DC
  Administered 2015-05-15 – 2015-05-16 (×2): 40 mg via ORAL
  Filled 2015-05-15 (×3): qty 2

## 2015-05-15 MED ORDER — BUPIVACAINE-EPINEPHRINE 0.25% -1:200000 IJ SOLN
INTRAMUSCULAR | Status: DC | PRN
Start: 2015-05-15 — End: 2015-05-15
  Administered 2015-05-15: 30 mL

## 2015-05-15 MED ORDER — PROPOFOL 10 MG/ML IV BOLUS
INTRAVENOUS | Status: AC
  Filled 2015-05-15: qty 20

## 2015-05-15 MED ORDER — OXYCODONE-ACETAMINOPHEN 5-325 MG PO TABS: 1.0000 | ORAL_TABLET | ORAL | Status: DC | PRN

## 2015-05-15 MED ORDER — CEFAZOLIN SODIUM-DEXTROSE 2-3 GM-% IV SOLR
2.0000 g | Freq: Four times a day (QID) | INTRAVENOUS | Status: AC
  Administered 2015-05-15 – 2015-05-16 (×2): 2 g via INTRAVENOUS
  Filled 2015-05-15 (×2): qty 50

## 2015-05-15 MED ORDER — ACETAMINOPHEN 650 MG RE SUPP: 650.0000 mg | Freq: Four times a day (QID) | RECTAL | Status: DC | PRN

## 2015-05-15 MED ORDER — POVIDONE-IODINE 7.5 % EX SOLN: Freq: Once | CUTANEOUS | Status: DC

## 2015-05-15 MED ORDER — DEXAMETHASONE SODIUM PHOSPHATE 10 MG/ML IJ SOLN
10.0000 mg | Freq: Once | INTRAMUSCULAR | Status: AC
  Administered 2015-05-15: 10 mg via INTRAVENOUS

## 2015-05-15 MED ORDER — ROSUVASTATIN CALCIUM 10 MG PO TABS
10.0000 mg | ORAL_TABLET | Freq: Every morning | ORAL | Status: DC
  Administered 2015-05-16 – 2015-05-17 (×2): 10 mg via ORAL
  Filled 2015-05-15 (×2): qty 1

## 2015-05-15 MED ORDER — METHOCARBAMOL 500 MG PO TABS
500.0000 mg | ORAL_TABLET | Freq: Four times a day (QID) | ORAL | Status: DC | PRN
  Administered 2015-05-16 (×2): 500 mg via ORAL
  Filled 2015-05-15 (×2): qty 1

## 2015-05-15 MED ORDER — MIDAZOLAM HCL 2 MG/2ML IJ SOLN
INTRAMUSCULAR | Status: AC
  Filled 2015-05-15: qty 4

## 2015-05-15 MED ORDER — ONDANSETRON HCL 4 MG/2ML IJ SOLN
INTRAMUSCULAR | Status: DC | PRN
  Administered 2015-05-15: 4 mg via INTRAVENOUS

## 2015-05-15 MED ORDER — FERROUS SULFATE 325 (65 FE) MG PO TABS
325.0000 mg | ORAL_TABLET | Freq: Three times a day (TID) | ORAL | Status: DC
  Administered 2015-05-15 – 2015-05-17 (×5): 325 mg via ORAL
  Filled 2015-05-15 (×8): qty 1

## 2015-05-15 MED ORDER — DARIFENACIN HYDROBROMIDE ER 7.5 MG PO TB24
7.5000 mg | ORAL_TABLET | Freq: Every day | ORAL | Status: DC
  Administered 2015-05-15 – 2015-05-16 (×2): 7.5 mg via ORAL
  Filled 2015-05-15 (×4): qty 1

## 2015-05-15 MED ORDER — KETOROLAC TROMETHAMINE 30 MG/ML IJ SOLN
INTRAMUSCULAR | Status: DC | PRN
  Administered 2015-05-15: 30 mg

## 2015-05-15 MED ORDER — METHOCARBAMOL 500 MG PO TABS: 500.0000 mg | ORAL_TABLET | Freq: Three times a day (TID) | ORAL | Status: DC | PRN

## 2015-05-15 MED ORDER — HYDROMORPHONE HCL 1 MG/ML IJ SOLN
INTRAMUSCULAR | Status: AC
  Filled 2015-05-15: qty 1

## 2015-05-15 MED ORDER — PROPOFOL 500 MG/50ML IV EMUL
INTRAVENOUS | Status: DC | PRN
  Administered 2015-05-15: 75 ug/kg/min via INTRAVENOUS

## 2015-05-15 MED ORDER — FENTANYL CITRATE (PF) 100 MCG/2ML IJ SOLN
INTRAMUSCULAR | Status: DC | PRN
  Administered 2015-05-15 (×2): 25 ug via INTRAVENOUS
  Administered 2015-05-15: 50 ug via INTRAVENOUS

## 2015-05-15 MED ORDER — INSULIN GLARGINE 100 UNIT/ML ~~LOC~~ SOLN
14.0000 [IU] | SUBCUTANEOUS | Status: DC
  Administered 2015-05-16 – 2015-05-17 (×2): 14 [IU] via SUBCUTANEOUS
  Filled 2015-05-15 (×3): qty 0.14

## 2015-05-15 MED ORDER — BISACODYL 5 MG PO TBEC: 5.0000 mg | DELAYED_RELEASE_TABLET | Freq: Every day | ORAL | Status: DC | PRN

## 2015-05-15 MED ORDER — FENTANYL CITRATE (PF) 100 MCG/2ML IJ SOLN
INTRAMUSCULAR | Status: AC
  Filled 2015-05-15: qty 4

## 2015-05-15 MED ORDER — ASPIRIN EC 325 MG PO TBEC: 325.0000 mg | DELAYED_RELEASE_TABLET | Freq: Two times a day (BID) | ORAL | Status: DC

## 2015-05-15 MED ORDER — PROMETHAZINE HCL 25 MG/ML IJ SOLN: 6.2500 mg | INTRAMUSCULAR | Status: DC | PRN

## 2015-05-15 MED ORDER — DOCUSATE SODIUM 100 MG PO CAPS
100.0000 mg | ORAL_CAPSULE | Freq: Two times a day (BID) | ORAL | Status: DC
  Administered 2015-05-15 – 2015-05-17 (×4): 100 mg via ORAL

## 2015-05-15 MED ORDER — INSULIN ASPART 100 UNIT/ML ~~LOC~~ SOLN
0.0000 [IU] | Freq: Three times a day (TID) | SUBCUTANEOUS | Status: DC
  Administered 2015-05-16: 2 [IU] via SUBCUTANEOUS
  Administered 2015-05-16: 3 [IU] via SUBCUTANEOUS
  Administered 2015-05-17: 2 [IU] via SUBCUTANEOUS

## 2015-05-15 MED ORDER — LACTATED RINGERS IV SOLN
INTRAVENOUS | Status: DC
  Administered 2015-05-15 (×3): via INTRAVENOUS

## 2015-05-15 MED ORDER — HYDROCHLOROTHIAZIDE 12.5 MG PO CAPS
12.5000 mg | ORAL_CAPSULE | Freq: Every day | ORAL | Status: DC
  Administered 2015-05-15 – 2015-05-17 (×2): 12.5 mg via ORAL
  Filled 2015-05-15 (×3): qty 1

## 2015-05-15 MED ORDER — BUPIVACAINE-EPINEPHRINE (PF) 0.25% -1:200000 IJ SOLN
INTRAMUSCULAR | Status: AC
  Filled 2015-05-15: qty 30

## 2015-05-15 MED ORDER — MIDAZOLAM HCL 5 MG/5ML IJ SOLN
INTRAMUSCULAR | Status: DC | PRN
  Administered 2015-05-15: 2 mg via INTRAVENOUS

## 2015-05-15 MED ORDER — POTASSIUM CHLORIDE IN NACL 20-0.9 MEQ/L-% IV SOLN
INTRAVENOUS | Status: DC
  Administered 2015-05-15: 19:00:00 via INTRAVENOUS
  Filled 2015-05-15 (×4): qty 1000

## 2015-05-15 MED ORDER — KETOROLAC TROMETHAMINE 30 MG/ML IJ SOLN
INTRAMUSCULAR | Status: AC
  Filled 2015-05-15: qty 1

## 2015-05-15 SURGICAL SUPPLY — 68 items
BAG DECANTER FOR FLEXI CONT (MISCELLANEOUS) IMPLANT
BAG ZIPLOCK 12X15 (MISCELLANEOUS) ×6 IMPLANT
BANDAGE ELASTIC 4 VELCRO ST LF (GAUZE/BANDAGES/DRESSINGS) ×3 IMPLANT
BANDAGE ELASTIC 6 VELCRO ST LF (GAUZE/BANDAGES/DRESSINGS) ×3 IMPLANT
BANDAGE ESMARK 6X9 LF (GAUZE/BANDAGES/DRESSINGS) ×1 IMPLANT
BLADE SAG 18X100X1.27 (BLADE) ×3 IMPLANT
BLADE SAW SGTL 13.0X1.19X90.0M (BLADE) ×3 IMPLANT
BNDG ESMARK 6X9 LF (GAUZE/BANDAGES/DRESSINGS) ×3
BONE CEMENT GENTAMICIN (Cement) ×6 IMPLANT
CAP KNEE TOTAL 3 SIGMA ×3 IMPLANT
CEMENT BONE GENTAMICIN 40 (Cement) ×2 IMPLANT
CUFF TOURN SGL QUICK 34 (TOURNIQUET CUFF) ×2
CUFF TRNQT CYL 34X4X40X1 (TOURNIQUET CUFF) ×1 IMPLANT
DECANTER SPIKE VIAL GLASS SM (MISCELLANEOUS) ×3 IMPLANT
DRAPE EXTREMITY T 121X128X90 (DRAPE) ×3 IMPLANT
DRAPE POUCH INSTRU U-SHP 10X18 (DRAPES) ×3 IMPLANT
DRAPE SHEET LG 3/4 BI-LAMINATE (DRAPES) ×3 IMPLANT
DRAPE U-SHAPE 47X51 STRL (DRAPES) ×3 IMPLANT
DRSG AQUACEL AG ADV 3.5X10 (GAUZE/BANDAGES/DRESSINGS) ×3 IMPLANT
DRSG TEGADERM 4X4.75 (GAUZE/BANDAGES/DRESSINGS) ×3 IMPLANT
DURAPREP 26ML APPLICATOR (WOUND CARE) ×3 IMPLANT
ELECT REM PT RETURN 9FT ADLT (ELECTROSURGICAL) ×3
ELECTRODE REM PT RTRN 9FT ADLT (ELECTROSURGICAL) ×1 IMPLANT
EVACUATOR 1/8 PVC DRAIN (DRAIN) ×3 IMPLANT
FACESHIELD WRAPAROUND (MASK) ×12 IMPLANT
GAUZE SPONGE 2X2 8PLY STRL LF (GAUZE/BANDAGES/DRESSINGS) ×1 IMPLANT
GLOVE BIOGEL PI IND STRL 8 (GLOVE) ×2 IMPLANT
GLOVE BIOGEL PI INDICATOR 8 (GLOVE) ×4
GLOVE SURG ORTHO 8.0 STRL STRW (GLOVE) ×6 IMPLANT
GLOVE SURG ORTHO 9.0 STRL STRW (GLOVE) ×3 IMPLANT
GLOVE SURG SS PI 7.5 STRL IVOR (GLOVE) ×3 IMPLANT
GOWN STRL REUS W/TWL XL LVL3 (GOWN DISPOSABLE) ×6 IMPLANT
HANDPIECE INTERPULSE COAX TIP (DISPOSABLE) ×2
IMMOBILIZER KNEE 20 (SOFTGOODS) ×3
IMMOBILIZER KNEE 20 THIGH 36 (SOFTGOODS) ×1 IMPLANT
KIT BASIN OR (CUSTOM PROCEDURE TRAY) ×3 IMPLANT
LIQUID BAND (GAUZE/BANDAGES/DRESSINGS) ×3 IMPLANT
NDL SAFETY ECLIPSE 18X1.5 (NEEDLE) ×1 IMPLANT
NEEDLE HYPO 18GX1.5 SHARP (NEEDLE) ×2
NS IRRIG 1000ML POUR BTL (IV SOLUTION) ×3 IMPLANT
PACK TOTAL JOINT (CUSTOM PROCEDURE TRAY) ×3 IMPLANT
PEN SKIN MARKING BROAD (MISCELLANEOUS) ×3 IMPLANT
POSITIONER SURGICAL ARM (MISCELLANEOUS) ×3 IMPLANT
SET HNDPC FAN SPRY TIP SCT (DISPOSABLE) ×1 IMPLANT
SET PAD KNEE POSITIONER (MISCELLANEOUS) ×3 IMPLANT
SPONGE GAUZE 2X2 STER 10/PKG (GAUZE/BANDAGES/DRESSINGS) ×2
SPONGE LAP 18X18 X RAY DECT (DISPOSABLE) IMPLANT
SPONGE SURGIFOAM ABS GEL 100 (HEMOSTASIS) ×3 IMPLANT
STOCKINETTE 6  STRL (DRAPES) ×2
STOCKINETTE 6 STRL (DRAPES) ×1 IMPLANT
SUCTION FRAZIER 12FR DISP (SUCTIONS) ×3 IMPLANT
SUT BONE WAX W31G (SUTURE) IMPLANT
SUT MNCRL AB 3-0 PS2 18 (SUTURE) ×3 IMPLANT
SUT VIC AB 1 CT1 27 (SUTURE) ×8
SUT VIC AB 1 CT1 27XBRD ANTBC (SUTURE) ×4 IMPLANT
SUT VIC AB 2-0 CT1 27 (SUTURE) ×4
SUT VIC AB 2-0 CT1 TAPERPNT 27 (SUTURE) ×2 IMPLANT
SUT VLOC 180 0 24IN GS25 (SUTURE) ×3 IMPLANT
SYR 50ML LL SCALE MARK (SYRINGE) ×3 IMPLANT
TAPE STRIPS DRAPE STRL (GAUZE/BANDAGES/DRESSINGS) ×3 IMPLANT
TOWEL OR 17X26 10 PK STRL BLUE (TOWEL DISPOSABLE) ×3 IMPLANT
TOWEL OR NON WOVEN STRL DISP B (DISPOSABLE) IMPLANT
TOWER CARTRIDGE SMART MIX (DISPOSABLE) ×3 IMPLANT
TRAY FOLEY W/METER SILVER 14FR (SET/KITS/TRAYS/PACK) ×3 IMPLANT
TRAY FOLEY W/METER SILVER 16FR (SET/KITS/TRAYS/PACK) IMPLANT
WATER STERILE IRR 1500ML POUR (IV SOLUTION) ×3 IMPLANT
WRAP KNEE MAXI GEL POST OP (GAUZE/BANDAGES/DRESSINGS) ×3 IMPLANT
YANKAUER SUCT BULB TIP NO VENT (SUCTIONS) ×3 IMPLANT

## 2015-05-15 NOTE — Anesthesia Procedure Notes (Signed)
Spinal Patient location during procedure: OR Staffing Anesthesiologist: Franne Grip Resident/CRNA: KEY, KRISTOPHER Performed by: anesthesiologist and resident/CRNA  Preanesthetic Checklist Completed: patient identified, site marked, surgical consent, pre-op evaluation, timeout performed, IV checked, risks and benefits discussed and monitors and equipment checked Spinal Block Patient position: sitting Prep: Betadine Patient monitoring: heart rate, continuous pulse ox and blood pressure Approach: midline Location: L3-4 Injection technique: single-shot Needle Needle type: Spinocan  Needle gauge: 22 G Needle length: 9 cm Additional Notes Expiration date of kit checked and confirmed. Patient tolerated procedure well, without complications. Attempt by Key, CRNA, then [employer]. CSF clear. No paresthesia. Meaningful verbal contact maintained throughout spinal placement.

## 2015-05-15 NOTE — Anesthesia Preprocedure Evaluation (Addendum)
Anesthesia Evaluation  Patient identified by MRN, date of birth, ID band Patient awake    Reviewed: Allergy & Precautions, NPO status , Patient's Chart, lab work & pertinent test results  History of Anesthesia Complications (+) history of anesthetic complications  Airway Mallampati: II  TM Distance: >3 FB Neck ROM: Full    Dental no notable dental hx.    Pulmonary neg pulmonary ROS,    Pulmonary exam normal breath sounds clear to auscultation       Cardiovascular Exercise Tolerance: Good hypertension, Pt. on medications Normal cardiovascular exam Rhythm:Regular Rate:Normal     Neuro/Psych  Neuromuscular disease negative psych ROS   GI/Hepatic Neg liver ROS, GERD  Medicated,  Endo/Other  diabetes, Type 1, Insulin Dependent  Renal/GU negative Renal ROS  negative genitourinary   Musculoskeletal  (+) Arthritis ,   Abdominal (+) + obese,   Peds negative pediatric ROS (+)  Hematology negative hematology ROS (+)   Anesthesia Other Findings   Reproductive/Obstetrics negative OB ROS                             Anesthesia Physical Anesthesia Plan  ASA: III  Anesthesia Plan: Spinal   Post-op Pain Management:    Induction: Intravenous  Airway Management Planned: Natural Airway  Additional Equipment:   Intra-op Plan:   Post-operative Plan:   Informed Consent: I have reviewed the patients History and Physical, chart, labs and discussed the procedure including the risks, benefits and alternatives for the proposed anesthesia with the patient or authorized representative who has indicated his/her understanding and acceptance.   Dental advisory given  Plan Discussed with: CRNA  Anesthesia Plan Comments: (Discussed risks and benefits of and differences between spinal and general. Discussed risks of spinal including headache, backache, failure, bleeding and hematoma, infection, and nerve  damage. Patient consents to spinal. Questions answered. Coagulation studies and platelet count acceptable. )       Anesthesia Quick Evaluation

## 2015-05-15 NOTE — Op Note (Signed)
DATE OF SURGERY:  05/15/2015  TIME: 3:57 PM  PATIENT NAME:  Pamela Love    AGE: 64 y.o.   PRE-OPERATIVE DIAGNOSIS:  RIGHT KNEE OA  POST-OPERATIVE DIAGNOSIS:  RIGHT KNEE OA  PROCEDURE:  Procedure(s): RIGHT TOTAL KNEE ARTHROPLASTY  SURGEON:  Zahir Eisenhour ANDREW  ASSISTANT:  Bryson Stilwell, PA-C, present and scrubbed throughout the case, critical for assistance with exposure, retraction, instrumentation, and closure.  OPERATIVE IMPLANTS: Depuy PFC Sigma Rotating Platform.  Femur size 3, Tibia size 2.5, Patella size 32315 3-peg oval button, with a 15 mm polyethylene insert.   PREOPERATIVE INDICATIONS:   Pamela Love is a 64 y.o. year old female with end stage bone on bone arthritis of the knee who failed conservative treatment and elected for Total Knee Arthroplasty.   The risks, benefits, and alternatives were discussed at length including but not limited to the risks of infection, bleeding, nerve injury, stiffness, blood clots, the need for revision surgery, cardiopulmonary complications, among others, and they were willing to proceed.  OPERATIVE DESCRIPTION:  The patient was brought to the operative room and placed in a supine position.  Spinal anesthesia was administered.  IV antibiotics were given.  The lower extremity was prepped and draped in the usual sterile fashion.  Time out was performed.  The leg was elevated and exsanguinated and the tourniquet was inflated.  Anterior quadriceps tendon splitting approach was performed.  The patella was retracted and osteophytes were removed.  The anterior horn of the medial and lateral meniscus was removed and cruciate ligaments resected.   The distal femur was opened with the drill and the intramedullary distal femoral cutting jig was utilized, set at 5 degrees resecting 10 mm off the distal femur.  Care was taken to protect the collateral ligaments.  The distal femoral sizing jig was applied, taking care to avoid notching.  Then  the 4-in-1 cutting jig was applied and the anterior and posterior femur was cut, along with the chamfer cuts.    Then the extramedullary tibial cutting jig was utilized making the appropriate cut using the anterior tibial crest as a reference building in appropriate posterior slope.  Care was taken during the cut to protect the medial and collateral ligaments.  The proximal tibia was removed along with the posterior horns of the menisci.   The posterior medial femoral osteophytes and posterior lateral femoral osteophytes were removed.    The flexion gap was then measured and was symmetric with the extension gap, measured at 12.  I completed the distal femoral preparation using the appropriate jig to prepare the box.  The patella was then measured, and cut with the saw.    The proximal tibia sized and prepared accordingly with the reamer and the punch, and then all components were trialed with the trial insert.  The knee was found to have excellent balance and full motion.    The above named components were then cemented into place and all excess cement was removed.  The trial polyethylene component was in place during cementation, and then was exchanged for the real polyethylene component.    The knee was easily taken through a range of motion and the patella tracked well and the knee irrigated copiously and the parapatellar and subcutaneous tissue closed with vicryl, and monocryl with steri strips for the skin.  The arthrotomy was closed at 90 of flexion. The wounds were dressed with sterile gauze and the tourniquet released and the patient was awakened and returned to the PACU  in stable and satisfactory condition.  There were no complications.  Total tourniquet time was 90 minutes.

## 2015-05-15 NOTE — Transfer of Care (Signed)
Immediate Anesthesia Transfer of Care Note  Patient: Pamela Love  Procedure(s) Performed: Procedure(s): RIGHT TOTAL KNEE ARTHROPLASTY (Right)  Patient Location: PACU  Anesthesia Type:Spinal  Level of Consciousness:  sedated, patient cooperative and responds to stimulation  Airway & Oxygen Therapy:Patient Spontanous Breathing and Patient connected to face mask oxgen  Post-op Assessment:  Report given to PACU RN and Post -op Vital signs reviewed and stable  Post vital signs:  Reviewed and stable  Last Vitals:  Filed Vitals:   05/15/15 1112  BP: 124/75  Pulse: 90  Temp: 37.1 C  Resp: 18    Complications: No apparent anesthesia complications

## 2015-05-15 NOTE — Progress Notes (Signed)
PHARMACIST - PHYSICIAN ORDER COMMUNICATION  CONCERNING: P&T Medication Policy on Herbal Medications  DESCRIPTION:  This patient's order for:  CoQ10  has been noted.  This product(s) is classified as an "herbal" or natural product. Due to a lack of definitive safety studies or FDA approval, nonstandard manufacturing practices, plus the potential risk of unknown drug-drug interactions while on inpatient medications, the Pharmacy and Therapeutics Committee does not permit the use of "herbal" or natural products of this type within [facility].   ACTION TAKEN: The pharmacy department is unable to verify this order at this time and the medication has been discontinued. Please reevaluate patient's clinical condition at discharge and address if the herbal or natural product(s) should be resumed at that time.   Adalberto ColeNikola Shyhiem Beeney PharmD, BCPS 05/15/15 17:20

## 2015-05-15 NOTE — Progress Notes (Signed)
Pt is admitted to 6E20 from PACU. Admission vital sign is stable. Pt is AOX4 and family is at the bedside

## 2015-05-15 NOTE — Interval H&P Note (Signed)
History and Physical Interval Note:  05/15/2015 1:45 PM  Pamela Love  has presented today for surgery, with the diagnosis of RIGHT KNEE OA  The various methods of treatment have been discussed with the patient and family. After consideration of risks, benefits and other options for treatment, the patient has consented to  Procedure(s): RIGHT TOTAL KNEE ARTHROPLASTY (Right) as a surgical intervention .  The patient's history has been reviewed, patient examined, no change in status, stable for surgery.  I have reviewed the patient's chart and labs.  Questions were answered to the patient's satisfaction.     Doxie Augenstein ANDREW

## 2015-05-16 LAB — BASIC METABOLIC PANEL
ANION GAP: 6 (ref 5–15)
BUN: 21 mg/dL — ABNORMAL HIGH (ref 6–20)
CO2: 27 mmol/L (ref 22–32)
Calcium: 9 mg/dL (ref 8.9–10.3)
Chloride: 103 mmol/L (ref 101–111)
Creatinine, Ser: 1.27 mg/dL — ABNORMAL HIGH (ref 0.44–1.00)
GFR calc non Af Amer: 43 mL/min — ABNORMAL LOW (ref >60)
GFR, EST AFRICAN AMERICAN: 50 mL/min — AB (ref >60)
GLUCOSE: 173 mg/dL — AB (ref 65–99)
POTASSIUM: 5.1 mmol/L (ref 3.5–5.1)
Sodium: 136 mmol/L (ref 135–145)

## 2015-05-16 LAB — CBC
HEMATOCRIT: 34.2 % — AB (ref 36.0–46.0)
HEMOGLOBIN: 11.2 g/dL — AB (ref 12.0–15.0)
MCH: 27.8 pg (ref 26.0–34.0)
MCHC: 32.7 g/dL (ref 30.0–36.0)
MCV: 84.9 fL (ref 78.0–100.0)
Platelets: 234 10*3/uL (ref 150–400)
RBC: 4.03 MIL/uL (ref 3.87–5.11)
RDW: 13.9 % (ref 11.5–15.5)
WBC: 9.8 10*3/uL (ref 4.0–10.5)

## 2015-05-16 LAB — GLUCOSE, CAPILLARY
GLUCOSE-CAPILLARY: 113 mg/dL — AB (ref 65–99)
GLUCOSE-CAPILLARY: 158 mg/dL — AB (ref 65–99)
Glucose-Capillary: 141 mg/dL — ABNORMAL HIGH (ref 65–99)
Glucose-Capillary: 159 mg/dL — ABNORMAL HIGH (ref 65–99)

## 2015-05-16 NOTE — Clinical Social Work Note (Signed)
CSW reviewed pt chart that stated case manager had met with pt who chose to go home with HH/PT.  In home services set up per case manager  No CSW needs  CSW signing off  .Dede Query, LCSW [facility] Clinical Social Worker - Weekend Coverage cell #: [phone]

## 2015-05-16 NOTE — Progress Notes (Signed)
Occupational Therapy Evaluation Patient Details Name: Pamela Love MRN: [id number] DOB: 29-Mar-1951 Today's Date: 05/16/2015    History of Present Illness Pt is a 64 year old female s/p R TKA with hx of L TKA 4 months ago.   Clinical Impression   Patient familiar with ADL techniques from prior L TKA in May 2016. Reviewed all with patient and husband and answered all questions. No further OT needs. Will sign off.    Follow Up Recommendations  No OT follow up;Supervision - Intermittent    Equipment Recommendations  None recommended by OT    Recommendations for Other Services PT consult     Precautions / Restrictions Precautions Precautions: Fall;Knee Required Braces or Orthoses: Knee Immobilizer - Right Restrictions Other Position/Activity Restrictions: WBAT      Mobility              Balance                                            ADL Overall ADL's : Needs assistance/impaired                                       General ADL Comments: Patient familiar with ADL techniques from prior L TKA, but she wanted a verbal review. Educated on LB dressing and bathing techniques. She has a Sports administrator, but states her husband will assist her with socks and shoes. She will wear loose shorts and sweatshirts. Reminded patient to dress R LE first; patient verbalized understanding. Patient reports she has been up and toileting with nursing and PT staff and does not need to practice. States she is not having difficulty with toilet transfer or toilet hygiene/clothing management with RW and 3 in 1 over toilet. Verbal education of walk-in shower technique and patient states she remembers this from prior L TKA. She has a built-in seat and her husband will assist as needed. No further OT needed. OT will sign off.     Vision     Perception     Praxis      Pertinent Vitals/Pain Pain Assessment: 0-10 Pain Score: 7  Pain Location: R knee Pain  Descriptors / Indicators: Aching;Sore Pain Intervention(s): Monitored during session;Limited activity within patient's tolerance     Hand Dominance Right   Extremity/Trunk Assessment Upper Extremity Assessment Upper Extremity Assessment: Overall WFL for tasks assessed   Lower Extremity Assessment Lower Extremity Assessment: Defer to PT evaluation        Communication Communication Communication: No difficulties   Cognition Arousal/Alertness: Awake/alert Behavior During Therapy: WFL for tasks assessed/performed Overall Cognitive Status: Within Functional Limits for tasks assessed                     General Comments       Exercises       Shoulder Instructions      Home Living Family/patient expects to be discharged to:: Private residence Living Arrangements: Spouse/significant other Available Help at Discharge: Family Type of Home: House Home Access: Stairs to enter Secretary/administrator of Steps: 3 Entrance Stairs-Rails: None Home Layout: Able to live on main level with bedroom/bathroom     Bathroom Shower/Tub: Producer, television/film/video: Handicapped height Bathroom Accessibility: Yes How Accessible: Accessible via walker Home Equipment: Dan Humphreys -  2 wheels;Cane - single point;Bedside commode;Shower seat - built in;Toilet riser          Prior Functioning/Environment Level of Independence: Independent             OT Diagnosis: Acute pain   OT Problem List: Decreased strength;Decreased range of motion;Pain   OT Treatment/Interventions:      OT Goals(Current goals can be found in the care plan section) Acute Rehab OT Goals Patient Stated Goal: to go home tomorrow OT Goal Formulation: All assessment and education complete, DC therapy  OT Frequency:     Barriers to D/C:            Co-evaluation              End of Session Nurse Communication: Patient requests pain meds  Activity Tolerance: Patient tolerated treatment  well Patient left: in bed;with call bell/phone within reach;with bed alarm set;with family/visitor present;with nursing/sitter in room   Time: [id number] OT Time Calculation (min): 14 min Charges:  OT General Charges $OT Visit: 1 Procedure OT Evaluation $Initial OT Evaluation Tier I: 1 Procedure G-Codes:    [name] A 05/16/2015, 1:40 PM

## 2015-05-16 NOTE — Progress Notes (Signed)
   Subjective: 1 Day Post-Op Procedure(s) (LRB): RIGHT TOTAL KNEE ARTHROPLASTY (Right)  Pt c/o mild to moderate pain this morning but overall doing well Denies any new symptoms or issues Otherwise doing fairly well Patient reports pain as moderate.  Objective:   VITALS:   Filed Vitals:   05/16/15 0559  BP: 96/51  Pulse: 72  Temp: 97.9 F (36.6 C)  Resp: 16    Right knee dressing and knee immobilizer intact Drain pulled nv intact distally No rashes or edema  LABS  Recent Labs  05/16/15 0450  HGB 11.2*  HCT 34.2*  WBC 9.8  PLT 234     Recent Labs  05/16/15 0450  NA 136  K 5.1  BUN 21*  CREATININE 1.27*  GLUCOSE 173*     Assessment/Plan: 1 Day Post-Op Procedure(s) (LRB): RIGHT TOTAL KNEE ARTHROPLASTY (Right) Plan for possible d/c tomorrow as long as therapy goes well today Pain management Pulmonary toilet PT/OT    Pamela Love Pamela Love, MPAS, PA-C  05/16/2015, 8:43 AM

## 2015-05-16 NOTE — Care Management Note (Signed)
Case Management Note  Patient Details  Name: Gweneth DimitriLynda D Traughber MRN: [id number] Date of Birth: 09/07/1950  Subjective/Objective:       RIGHT TOTAL KNEE ARTHROPLASTY             Action/Plan: NCM spoke to pt and [facility] Pt states she has RW and 3n1 at home. Family at home to assist with her care.   Expected Discharge Date:  05/16/2015             Expected Discharge Plan:  Home w Home Health Services  In-House Referral:     Discharge planning Services  CM Consult   HH Arranged:  [facility]  Status of Service:  Completed, signed off  Medicare Important Message Given:    Date Medicare IM Given:    Medicare IM give by:    Date Additional Medicare IM Given:    Additional Medicare Important Message give by:     If discussed at Long Length of Stay Meetings, dates discussed:    Additional Comments:  Elliot CousinShavis, Zamariyah Furukawa Ellen, RN 05/16/2015, 11:13 AM

## 2015-05-16 NOTE — Evaluation (Signed)
Physical Therapy Evaluation Patient Details Name: Pamela Love MRN: [id number] DOB: 12/28/1950 Today's Date: 05/16/2015   History of Present Illness  Pt is a [age] year old female s/p R TKA with hx of L TKA 4 months ago.  Clinical Impression  Pt is s/p R TKA resulting in the deficits listed below (see PT Problem List).  Pt will benefit from skilled PT to increase their independence and safety with mobility to allow discharge to the venue listed below.  Pt mobilizing well POD #1 and plans to d/c home with spouse.     Follow Up Recommendations Home health PT    Equipment Recommendations  None recommended by PT    Recommendations for Other Services       Precautions / Restrictions Precautions Precautions: Fall;Knee Required Braces or Orthoses: Knee Immobilizer - Right Restrictions Other Position/Activity Restrictions: WBAT      Mobility  Bed Mobility Overal bed mobility: Needs Assistance Bed Mobility: Supine to Sit     Supine to sit: Supervision;HOB elevated     General bed mobility comments: verbal cues for self assist of R LE  Transfers Overall transfer level: Needs assistance Equipment used: Rolling walker (2 wheeled) Transfers: Sit to/from Stand Sit to Stand: Min guard         General transfer comment: verbal cues for safe technique  Ambulation/Gait Ambulation/Gait assistance: Min guard Ambulation Distance (Feet): 80 Feet Assistive device: Rolling walker (2 wheeled) Gait Pattern/deviations: Step-to pattern;Antalgic     General Gait Details: verbal cues for sequence, RW positioning, step length, posture  Stairs            Wheelchair Mobility    Modified Rankin (Stroke Patients Only)       Balance                                             Pertinent Vitals/Pain Pain Assessment: 0-10 Pain Score: 5  Pain Location: R knee Pain Descriptors / Indicators: Aching;Sore Pain Intervention(s): Monitored during session;Limited  activity within patient's tolerance;RN gave pain meds during session;Repositioned;Ice applied    Home Living Family/patient expects to be discharged to:: Private residence Living Arrangements: Spouse/significant other   Type of Home: House Home Access: Stairs to enter Entrance Stairs-Rails: None Entrance Stairs-Number of Steps: 3 Home Layout: Able to live on main level with bedroom/bathroom Home Equipment: Cane - single point;Walker - 2 wheels      Prior Function Level of Independence: Independent               Hand Dominance        Extremity/Trunk Assessment               Lower Extremity Assessment: RLE deficits/detail RLE Deficits / Details: unable to perform SLR, good quad contraction, AAROM R knee flexion 65*       Communication   Communication: No difficulties  Cognition Arousal/Alertness: Awake/alert Behavior During Therapy: WFL for tasks assessed/performed Overall Cognitive Status: Within Functional Limits for tasks assessed                      General Comments      Exercises Total Joint Exercises Ankle Circles/Pumps: AROM;Both;10 reps Quad Sets: AROM;Both;10 reps Short Arc QuadBarbaraann Boys: AAROM;Right;10 reps Heel Slides: AAROM;Right;10 reps Hip ABduction/ADduction: AROM;Right;10 reps Straight Leg Raises: AAROM;Right;10 reps      Assessment/Plan  PT Assessment Patient needs continued PT services  PT Diagnosis Difficulty walking;Acute pain   PT Problem List Decreased strength;Decreased range of motion;Decreased mobility;Decreased knowledge of use of DME;Pain  PT Treatment Interventions Functional mobility training;Stair training;Gait training;DME instruction;Patient/family education;Therapeutic activities;Therapeutic exercise   PT Goals (Current goals can be found in the Care Plan section) Acute Rehab PT Goals PT Goal Formulation: With patient Time For Goal Achievement: 05/21/15 Potential to Achieve Goals: Good    Frequency 7X/week    Barriers to discharge        Co-evaluation               End of Session Equipment Utilized During Treatment: Gait belt Activity Tolerance: Patient tolerated treatment well Patient left: in chair;with call bell/phone within reach;with nursing/sitter in room           Time: 0939-1005 PT Time Calculation (min) (ACUTE ONLY): 26 min   Charges:   PT Evaluation $Initial PT Evaluation Tier I: 1 Procedure PT Treatments $Therapeutic Exercise: 8-22 mins   PT G Codes:        Laurianne Floresca,KATHrine E 05/16/2015, 1:23 PM Zenovia Jarred, PT, DPT 05/16/2015 Pager: [phone]

## 2015-05-16 NOTE — Anesthesia Postprocedure Evaluation (Signed)
  Anesthesia Post-op Note  Patient: Pamela Love  Procedure(s) Performed: Procedure(s) (LRB): RIGHT TOTAL KNEE ARTHROPLASTY (Right)  Patient Location: PACU  Anesthesia Type: Spinal  Level of Consciousness: awake and alert   Airway and Oxygen Therapy: Patient Spontanous Breathing  Post-op Pain: mild  Post-op Assessment: Post-op Vital signs reviewed, Patient's Cardiovascular Status Stable, Respiratory Function Stable, Patent Airway and No signs of Nausea or vomiting. Purposeful movement both legs in PACU.  Last Vitals:  Filed Vitals:   05/16/15 0559  BP: 96/51  Pulse: 72  Temp: 36.6 C  Resp: 16    Post-op Vital Signs: stable   Complications: No apparent anesthesia complications

## 2015-05-16 NOTE — Progress Notes (Signed)
Physical Therapy Treatment Note    05/16/15 1400  PT Visit Information  Last PT Received On 05/16/15  Assistance Needed +1  History of Present Illness Pt is a 64 year old female s/p R TKA with hx of L TKA 4 months ago.  PT Time Calculation  PT Start Time (ACUTE ONLY) 1355  PT Stop Time (ACUTE ONLY) 1416  PT Time Calculation (min) (ACUTE ONLY) 21 min  Subjective Data  Subjective Pt ambulated in hallway, assisted to bathroom and then returned to bed.  Precautions  Precautions Fall;Knee  Required Braces or Orthoses Knee Immobilizer - Right  Restrictions  Other Position/Activity Restrictions WBAT  Pain Assessment  Pain Assessment 0-10  Pain Score 7  Pain Location R knee  Pain Descriptors / Indicators Aching;Sore  Pain Intervention(s) Limited activity within patient's tolerance;Monitored during session;Premedicated before session;Repositioned;Ice applied  Cognition  Arousal/Alertness Awake/alert  Behavior During Therapy WFL for tasks assessed/performed  Overall Cognitive Status Within Functional Limits for tasks assessed  Bed Mobility  Overal bed mobility Needs Assistance  Bed Mobility Supine to Sit;Sit to Supine  Supine to sit Supervision;HOB elevated  Sit to supine Supervision  General bed mobility comments verbal cues for self assist of R LE  Transfers  Overall transfer level Needs assistance  Equipment used Rolling walker (2 wheeled)  Transfers Sit to/from Stand  Sit to Stand Min guard  General transfer comment verbal cues for safe technique  Ambulation/Gait  Ambulation/Gait assistance Min guard  Ambulation Distance (Feet) 100 Feet  Assistive device Rolling walker (2 wheeled)  Gait Pattern/deviations Step-to pattern;Antalgic  General Gait Details verbal cues for sequence, RW positioning, step length, posture  PT - End of Session  Activity Tolerance Patient tolerated treatment well  Patient left in bed;with call bell/phone within reach;with family/visitor present  PT -  Assessment/Plan  PT Plan Current plan remains appropriate  PT Frequency (ACUTE ONLY) 7X/week  Follow Up Recommendations Home health PT  PT equipment None recommended by PT  PT Goal Progression  Progress towards PT goals Progressing toward goals  PT General Charges  $$ ACUTE PT VISIT 1 Procedure  PT Treatments  $Gait Training 8-22 mins   Zenovia JarredKati Tye Juarez, PT, DPT 05/16/2015 Pager: [phone]

## 2015-05-17 LAB — CBC
HCT: 31.5 % — ABNORMAL LOW (ref 36.0–46.0)
Hemoglobin: 10.3 g/dL — ABNORMAL LOW (ref 12.0–15.0)
MCH: 28 pg (ref 26.0–34.0)
MCHC: 32.7 g/dL (ref 30.0–36.0)
MCV: 85.6 fL (ref 78.0–100.0)
PLATELETS: 226 10*3/uL (ref 150–400)
RBC: 3.68 MIL/uL — AB (ref 3.87–5.11)
RDW: 14 % (ref 11.5–15.5)
WBC: 11.6 10*3/uL — AB (ref 4.0–10.5)

## 2015-05-17 LAB — GLUCOSE, CAPILLARY: Glucose-Capillary: 135 mg/dL — ABNORMAL HIGH (ref 65–99)

## 2015-05-17 NOTE — Progress Notes (Signed)
Utilization review completed.  

## 2015-05-17 NOTE — Discharge Summary (Signed)
Physician Discharge Summary   Patient ID: Pamela Love MRN: [id number] DOB/AGE: 02-15-1951 64 y.o.  Admit date: 05/15/2015 Discharge date: 05/17/2015  Admission Diagnoses:  Active Problems:   S/P knee replacement   S/P total knee replacement using cement   Osteoarthritis of right knee   Discharge Diagnoses:  Same   Surgeries: Procedure(s): RIGHT TOTAL KNEE ARTHROPLASTY on 05/15/2015   Consultants: PT/OT  Discharged Condition: Stable  Hospital Course: Pamela Love is an 64 y.o. female who was admitted 05/15/2015 with a chief complaint of No chief complaint on file. , and found to have a diagnosis of <principal problem not specified>.  They were brought to the operating room on 05/15/2015 and underwent the above named procedures.    The patient had an uncomplicated hospital course and was stable for discharge.  Recent vital signs:  Filed Vitals:   05/17/15 0608  BP: 117/61  Pulse: 82  Temp: 98.2 F (36.8 C)  Resp: 16    Recent laboratory studies:  Results for orders placed or performed during the hospital encounter of 05/15/15  Glucose, capillary  Result Value Ref Range   Glucose-Capillary 99 65 - 99 mg/dL   Comment 1 Notify RN   Glucose, capillary  Result Value Ref Range   Glucose-Capillary 125 (H) 65 - 99 mg/dL  CBC  Result Value Ref Range   WBC 9.8 4.0 - 10.5 K/uL   RBC 4.03 3.87 - 5.11 MIL/uL   Hemoglobin 11.2 (L) 12.0 - 15.0 g/dL   HCT 40.9 (L) 81.1 - 91.4 %   MCV 84.9 78.0 - 100.0 fL   MCH 27.8 26.0 - 34.0 pg   MCHC 32.7 30.0 - 36.0 g/dL   RDW 78.2 95.6 - 21.3 %   Platelets 234 150 - 400 K/uL  Basic metabolic panel  Result Value Ref Range   Sodium 136 135 - 145 mmol/L   Potassium 5.1 3.5 - 5.1 mmol/L   Chloride 103 101 - 111 mmol/L   CO2 27 22 - 32 mmol/L   Glucose, Bld 173 (H) 65 - 99 mg/dL   BUN 21 (H) 6 - 20 mg/dL   Creatinine, Ser 0.86 (H) 0.44 - 1.00 mg/dL   Calcium 9.0 8.9 - 57.8 mg/dL   GFR calc non Af Amer 43 (L) >60 mL/min   GFR  calc Af Amer 50 (L) >60 mL/min   Anion gap 6 5 - 15  Glucose, capillary  Result Value Ref Range   Glucose-Capillary 139 (H) 65 - 99 mg/dL  Glucose, capillary  Result Value Ref Range   Glucose-Capillary 184 (H) 65 - 99 mg/dL  Glucose, capillary  Result Value Ref Range   Glucose-Capillary 141 (H) 65 - 99 mg/dL  Glucose, capillary  Result Value Ref Range   Glucose-Capillary 113 (H) 65 - 99 mg/dL  CBC  Result Value Ref Range   WBC 11.6 (H) 4.0 - 10.5 K/uL   RBC 3.68 (L) 3.87 - 5.11 MIL/uL   Hemoglobin 10.3 (L) 12.0 - 15.0 g/dL   HCT 46.9 (L) 62.9 - 52.8 %   MCV 85.6 78.0 - 100.0 fL   MCH 28.0 26.0 - 34.0 pg   MCHC 32.7 30.0 - 36.0 g/dL   RDW 41.3 24.4 - 01.0 %   Platelets 226 150 - 400 K/uL  Glucose, capillary  Result Value Ref Range   Glucose-Capillary 158 (H) 65 - 99 mg/dL   Comment 1 Notify RN   Glucose, capillary  Result Value Ref Range   Glucose-Capillary  159 (H) 65 - 99 mg/dL  Glucose, capillary  Result Value Ref Range   Glucose-Capillary 135 (H) 65 - 99 mg/dL    Discharge Medications:     Medication List    TAKE these medications        aspirin EC 325 MG tablet  Take 1 tablet (325 mg total) by mouth 2 (two) times daily.     CALTRATE 600 PLUS-VIT D PO  Take 1 tablet by mouth every morning.     CoQ10 200 MG Caps  Take 200 mg by mouth at bedtime.     diclofenac sodium 1 % Gel  Commonly known as:  VOLTAREN  Apply 1 application topically at bedtime as needed (knee pain.). Applied knees     DULoxetine 20 MG capsule  Commonly known as:  CYMBALTA  Take 40 mg by mouth at bedtime.     folic acid 800 MCG tablet  Commonly known as:  FOLVITE  Take 800 mcg by mouth at bedtime.     Insulin Glargine 100 UNIT/ML Solostar Pen  Commonly known as:  LANTUS SOLOSTAR  Inject 14 Units into the skin every morning. And pen needles 1/day     methocarbamol 500 MG tablet  Commonly known as:  ROBAXIN  Take 1 tablet (500 mg total) by mouth every 8 (eight) hours as needed for  muscle spasms.     multivitamin with minerals Tabs tablet  Take 1 tablet by mouth every morning.     omeprazole 20 MG capsule  Commonly known as:  PRILOSEC  Take 20 mg by mouth every morning.     ONE TOUCH ULTRA TEST test strip  Generic drug:  glucose blood  Use to to check blood sugar 2 times per day.     oxyCODONE-acetaminophen 5-325 MG tablet  Commonly known as:  ROXICET  Take 1-2 tablets by mouth every 4 (four) hours as needed for severe pain.     RA FISH OIL 1400 MG Cpdr  Take 1,400 mg by mouth 2 (two) times daily.     rosuvastatin 10 MG tablet  Commonly known as:  CRESTOR  Take 10 mg by mouth every morning.     trospium 20 MG tablet  Commonly known as:  SANCTURA  Take 20 mg by mouth 2 (two) times daily.     TYLENOL ARTHRITIS PAIN 650 MG CR tablet  Generic drug:  acetaminophen  Take 1,300 mg by mouth at bedtime as needed for pain.     valsartan-hydrochlorothiazide 320-25 MG tablet  Commonly known as:  DIOVAN-HCT  Take 0.5 tablets by mouth every morning.     Vitamin D 2000 UNITS Caps  Take 2,000 Units by mouth every morning.        Diagnostic Studies: Mm Screening Breast Tomo Bilateral  05/11/2015  CLINICAL DATA:  Screening. EXAM: DIGITAL SCREENING BILATERAL MAMMOGRAM WITH 3D TOMO WITH CAD COMPARISON:  Previous exam(s). ACR Breast Density Category c: The breast tissue is heterogeneously dense, which may obscure small masses. FINDINGS: There are no findings suspicious for malignancy. Images were processed with CAD. IMPRESSION: No mammographic evidence of malignancy. A result letter of this screening mammogram will be mailed directly to the patient. RECOMMENDATION: Screening mammogram in one year. (Code:SM-B-01Y) BI-RADS CATEGORY  1: Negative. Electronically Signed   By: Beckie SaltsSteven  Reid M.D.   On: 05/11/2015 07:46    Disposition: 06-Home-Health Care Svc      Discharge Instructions    Call MD / Call 911    Complete by:  As directed   If you experience chest pain or  shortness of breath, CALL 911 and be transported to the hospital emergency room.  If you develope a fever above 101 F, pus (white drainage) or increased drainage or redness at the wound, or calf pain, call your surgeon's office.     Constipation Prevention    Complete by:  As directed   Drink plenty of fluids.  Prune juice may be helpful.  You may use a stool softener, such as Colace (over the counter) 100 mg twice a day.  Use MiraLax (over the counter) for constipation as needed.     Diet - low sodium heart healthy    Complete by:  As directed      Discharge instructions    Complete by:  As directed   INSTRUCTIONS AFTER JOINT REPLACEMENT   Remove items at home which could result in a fall. This includes throw rugs or furniture in walking pathways ICE to the affected joint every three hours while awake for 30 minutes at a time, for at least the first 3-5 days, and then as needed for pain and swelling.  Continue to use ice for pain and swelling. You may notice swelling that will progress down to the foot and ankle.  This is normal after surgery.  Elevate your leg when you are not up walking on it.   Continue to use the breathing machine you got in the hospital (incentive spirometer) which will help keep your temperature down.  It is common for your temperature to cycle up and down following surgery, especially at night when you are not up moving around and exerting yourself.  The breathing machine keeps your lungs expanded and your temperature down.   DIET:  As you were doing prior to hospitalization, we recommend a well-balanced diet.  DRESSING / WOUND CARE / SHOWERING  Keep the surgical dressing until follow up.  The dressing is water proof, so you can shower without any extra covering.  IF THE DRESSING FALLS OFF or the wound gets wet inside, change the dressing with sterile gauze.  Please use good hand washing techniques before changing the dressing.  Do not use any lotions or creams on the  incision until instructed by your surgeon.    ACTIVITY  Increase activity slowly as tolerated, but follow the weight bearing instructions below.   No driving for 6 weeks or until further direction given by your physician.  You cannot drive while taking narcotics.  No lifting or carrying greater than 10 lbs. until further directed by your surgeon. Avoid periods of inactivity such as sitting longer than an hour when not asleep. This helps prevent blood clots.  You may return to work once you are authorized by your doctor.     WEIGHT BEARING   Weight bearing as tolerated with assist device (walker, cane, etc) as directed, use it as long as suggested by your surgeon or therapist, typically at least 4-6 weeks.   EXERCISES  Results after joint replacement surgery are often greatly improved when you follow the exercise, range of motion and muscle strengthening exercises prescribed by your doctor. Safety measures are also important to protect the joint from further injury. Any time any of these exercises cause you to have increased pain or swelling, decrease what you are doing until you are comfortable again and then slowly increase them. If you have problems or questions, call your caregiver or physical therapist for advice.   Rehabilitation is important following  a joint replacement. After just a few days of immobilization, the muscles of the leg can become weakened and shrink (atrophy).  These exercises are designed to build up the tone and strength of the thigh and leg muscles and to improve motion. Often times heat used for twenty to thirty minutes before working out will loosen up your tissues and help with improving the range of motion but do not use heat for the first two weeks following surgery (sometimes heat can increase post-operative swelling).   These exercises can be done on a training (exercise) mat, on the floor, on a table or on a bed. Use whatever works the best and is most  comfortable for you.    Use music or television while you are exercising so that the exercises are a pleasant break in your day. This will make your life better with the exercises acting as a break in your routine that you can look forward to.   Perform all exercises about fifteen times, three times per day or as directed.  You should exercise both the operative leg and the other leg as well.   Exercises include:   Quad Sets - Tighten up the muscle on the front of the thigh (Quad) and hold for 5-10 seconds.   Straight Leg Raises - With your knee straight (if you were given a brace, keep it on), lift the leg to 60 degrees, hold for 3 seconds, and slowly lower the leg.  Perform this exercise against resistance later as your leg gets stronger.  Leg Slides: Lying on your back, slowly slide your foot toward your buttocks, bending your knee up off the floor (only go as far as is comfortable). Then slowly slide your foot back down until your leg is flat on the floor again.  Angel Wings: Lying on your back spread your legs to the side as far apart as you can without causing discomfort.  Hamstring Strength:  Lying on your back, push your heel against the floor with your leg straight by tightening up the muscles of your buttocks.  Repeat, but this time bend your knee to a comfortable angle, and push your heel against the floor.  You may put a pillow under the heel to make it more comfortable if necessary.   A rehabilitation program following joint replacement surgery can speed recovery and prevent re-injury in the future due to weakened muscles. Contact your doctor or a physical therapist for more information on knee rehabilitation.    CONSTIPATION  Constipation is defined medically as fewer than three stools per week and severe constipation as less than one stool per week.  Even if you have a regular bowel pattern at home, your normal regimen is likely to be disrupted due to multiple reasons following surgery.   Combination of anesthesia, postoperative narcotics, change in appetite and fluid intake all can affect your bowels.   YOU MUST use at least one of the following options; they are listed in order of increasing strength to get the job done.  They are all available over the counter, and you may need to use some, POSSIBLY even all of these options:    Drink plenty of fluids (prune juice may be helpful) and high fiber foods Colace 100 mg by mouth twice a day  Senokot for constipation as directed and as needed Dulcolax (bisacodyl), take with full glass of water  Miralax (polyethylene glycol) once or twice a day as needed.  If you have tried all these  things and are unable to have a bowel movement in the first 3-4 days after surgery call either your surgeon or your primary doctor.    If you experience loose stools or diarrhea, hold the medications until you stool forms back up.  If your symptoms do not get better within 1 week or if they get worse, check with your doctor.  If you experience "the worst abdominal pain ever" or develop nausea or vomiting, please contact the office immediately for further recommendations for treatment.   ITCHING:  If you experience itching with your medications, try taking only a single pain pill, or even half a pain pill at a time.  You can also use Benadryl over the counter for itching or also to help with sleep.   TED HOSE STOCKINGS:  Use stockings on both legs until for at least 2 weeks or as directed by physician office. They may be removed at night for sleeping.  MEDICATIONS:  See your medication summary on the "After Visit Summary" that nursing will review with you.  You may have some home medications which will be placed on hold until you complete the course of blood thinner medication.  It is important for you to complete the blood thinner medication as prescribed.  PRECAUTIONS:  If you experience chest pain or shortness of breath - call 911 immediately for  transfer to the hospital emergency department.   If you develop a fever greater that 101 F, purulent drainage from wound, increased redness or drainage from wound, foul odor from the wound/dressing, or calf pain - CONTACT YOUR SURGEON.                                                   FOLLOW-UP APPOINTMENTS:  If you do not already have a post-op appointment, please call the office for an appointment to be seen by your surgeon.  Guidelines for how soon to be seen are listed in your "After Visit Summary", but are typically between 1-4 weeks after surgery.  OTHER INSTRUCTIONS:   Knee Replacement:  Do not place pillow under knee, focus on keeping the knee straight while resting. CPM instructions: 0-90 degrees, 2 hours in the morning, 2 hours in the afternoon, and 2 hours in the evening. Place foam block, curve side up under heel at all times except when in CPM or when walking.  DO NOT modify, tear, cut, or change the foam block in any way.  MAKE SURE YOU:  Understand these instructions.  Get help right away if you are not doing well or get worse.    Thank you for letting us be a part of your medical care team.  It is a privilege we respect greatly.  We hope these instructions will help you stay on track for a fast and full recovery!     Increase activity slowly as tolerated    Complete by:  As directed            Follow-up Information    Follow up with [facility].   Why:  Home Health Physical Therapy   Contact information:   [address] [place] [address] [phone]        Signed: Thea Gist 05/17/2015, 7:21 AM

## 2015-05-17 NOTE — Progress Notes (Signed)
Discharged from floor via w/c, belongings & spouse with pt. No changes in assessment. Barbara Keng  

## 2015-05-17 NOTE — Progress Notes (Signed)
Physical Therapy Treatment Patient Details Name: Pamela Love MRN: [id number] DOB: 28-Dec-1950 Today's Date: 05/17/2015    History of Present Illness Pt is a 64 year old female s/p R TKA with hx of L TKA 4 months ago.    PT Comments    Progressing well  Follow Up Recommendations  Home health PT     Equipment Recommendations  None recommended by PT    Recommendations for Other Services       Precautions / Restrictions Precautions Precautions: Fall;Knee Required Braces or Orthoses: Knee Immobilizer - Right Knee Immobilizer - Right: Discontinue once straight leg raise with < 10 degree lag Restrictions Other Position/Activity Restrictions: WBAT    Mobility  Bed Mobility Overal bed mobility: Needs Assistance Bed Mobility: Supine to Sit;Sit to Supine     Supine to sit: Supervision;HOB elevated Sit to supine: Supervision   General bed mobility comments: verbal cues for self assist of R LE  Transfers Overall transfer level: Needs assistance Equipment used: Rolling walker (2 wheeled) Transfers: Sit to/from Stand Sit to Stand: Supervision         General transfer comment: verbal cues for hand placement  Ambulation/Gait Ambulation/Gait assistance: Supervision Ambulation Distance (Feet): 120 Feet Assistive device: Rolling walker (2 wheeled) Gait Pattern/deviations: Step-to pattern;Step-through pattern     General Gait Details: verbal cues for sequence, RW positioning, step length, posture   Stairs Stairs: Yes Stairs assistance: Min assist Stair Management: Backwards;Step to pattern;No rails;With walker Number of Stairs: 2 General stair comments: cues for technique  Wheelchair Mobility    Modified Rankin (Stroke Patients Only)       Balance                                    Cognition Arousal/Alertness: Awake/alert Behavior During Therapy: WFL for tasks assessed/performed Overall Cognitive Status: Within Functional Limits for tasks  assessed                      Exercises Total Joint Exercises Ankle Circles/Pumps: AROM;Both;10 reps Quad Sets: AROM;Both;10 reps Heel Slides: AAROM;Right;10 reps Hip ABduction/ADduction: AROM;Right;10 reps Straight Leg Raises: AAROM;Right;10 reps    General Comments        Pertinent Vitals/Pain Pain Assessment: 0-10 Pain Score: 4  Pain Location: R knee Pain Intervention(s): Limited activity within patient's tolerance;Monitored during session;Premedicated before session;Repositioned    Home Living                      Prior Function            PT Goals (current goals can now be found in the care plan section) Acute Rehab PT Goals PT Goal Formulation: With patient Time For Goal Achievement: 05/21/15 Potential to Achieve Goals: Good Progress towards PT goals: Progressing toward goals    Frequency  7X/week    PT Plan Current plan remains appropriate    Co-evaluation             End of Session Equipment Utilized During Treatment: Right knee immobilizer Activity Tolerance: Patient tolerated treatment well Patient left: in bed;with call bell/phone within reach     Time: 0935-1000 PT Time Calculation (min) (ACUTE ONLY): 25 min  Charges:  $Gait Training: 8-22 mins $Therapeutic Exercise: 8-22 mins                    G Codes:  [facility] CtrWILLIAMS,Rusell Meneely 05/17/2015, 10:13 AM

## 2015-05-17 NOTE — Progress Notes (Signed)
   Subjective: 2 Days Post-Op Procedure(s) (LRB): RIGHT TOTAL KNEE ARTHROPLASTY (Right)  Pt doing very well Minimal pain Did very well with therapy yesterday Plan for d/c home today Patient reports pain as mild.  Objective:   VITALS:   Filed Vitals:   05/17/15 0608  BP: 117/61  Pulse: 82  Temp: 98.2 F (36.8 C)  Resp: 16    Right knee incision healing well Dressing in place nv intact distally Reinforced drain site  LABS  Recent Labs  05/16/15 0450 05/17/15 0513  HGB 11.2* 10.3*  HCT 34.2* 31.5*  WBC 9.8 11.6*  PLT 234 226     Recent Labs  05/16/15 0450  NA 136  K 5.1  BUN 21*  CREATININE 1.27*  GLUCOSE 173*     Assessment/Plan: 2 Days Post-Op Procedure(s) (LRB): RIGHT TOTAL KNEE ARTHROPLASTY (Right) PT/OT today D/c after therapy today F/u in 2 weeks in the office    Brad Kamori Barbier, MPAS, PA-C  05/17/2015, 7:20 AM

## 2015-05-17 NOTE — Plan of Care (Signed)
Problem: Phase III Progression Outcomes Goal: Anticoagulant follow-up in place Outcome: Not Applicable Date Met:  72/94/26 asa  Problem: Discharge Progression Outcomes Goal: Anticoagulant follow-up in place Outcome: Not Applicable Date Met:  27/00/48 asa

## 2015-05-18 ENCOUNTER — Encounter (HOSPITAL_COMMUNITY): Payer: Self-pay | Admitting: Specialist

## 2015-05-29 ENCOUNTER — Ambulatory Visit: Payer: Medicare Other | Admitting: Endocrinology

## 2015-06-17 ENCOUNTER — Encounter: Payer: Self-pay | Admitting: Endocrinology

## 2015-06-19 ENCOUNTER — Ambulatory Visit (INDEPENDENT_AMBULATORY_CARE_PROVIDER_SITE_OTHER): Payer: Medicare Other | Admitting: Endocrinology

## 2015-06-19 ENCOUNTER — Encounter: Payer: Self-pay | Admitting: Endocrinology

## 2015-06-19 VITALS — BP 137/86 | HR 83 | Temp 98.3°F | Ht 63.0 in

## 2015-06-19 DIAGNOSIS — E1121 Type 2 diabetes mellitus with diabetic nephropathy: Secondary | ICD-10-CM

## 2015-06-19 MED ORDER — METFORMIN HCL ER 500 MG PO TB24: 500.0000 mg | ORAL_TABLET | Freq: Every day | ORAL | Status: DC

## 2015-06-19 MED ORDER — INSULIN GLARGINE 100 UNIT/ML SOLOSTAR PEN: 5.0000 [IU] | PEN_INJECTOR | SUBCUTANEOUS | Status: DC

## 2015-06-19 NOTE — Progress Notes (Signed)
Subjective:    Patient ID: Pamela Love, female    DOB: Jul 15, 1951, 64 y.o.   MRN: [id number]  HPI Pt returns for f/u of diabetes mellitus: DM type: Insulin-requiring type 2 Dx'ed: 2012 Complications: renal insufficiency. Therapy: insulin since 2016 GDM: never DKA: never Severe hypoglycemia: never Pancreatitis: never Other: plan is to see if she can be managed with oral rx only.   Interval history: She feels better since her recent TKR.  she brings a record of her cbg's which i have reviewed today.  It varies from 100-200, but most are in the mid-100's.  There is no trend throughout the day.  She did not tolerate janumet (?XR).  She says she can take a brand-name if necessary.   Past Medical History  Diagnosis Date  . Hypertension   . Hyperlipidemia   . Arthritis   . Bursitis     hips  . Osteoporosis   . History of nonmelanoma skin cancer   . GERD (gastroesophageal reflux disease)   . OAB (overactive bladder)   . Diabetes mellitus without complication (HCC)     diet controlled - has Rx for Glymipride but has not started taking yet  . Complication of anesthesia     "spinal did not work with second c section"  . History of skin cancer     Past Surgical History  Procedure Laterality Date  . Cesarean section      x2  . Uterine fibroid surgery    . Abdominal hysterectomy  2005  . Eye surgery      bilat cataract surgery 2015  . Colonscopy     . Total knee arthroplasty Left 12/18/2014    Procedure: TOTAL LEFT KNEE ARTHROPLASTY;  Surgeon: Eugenia Mcalpine, MD;  Location: [facility];  Service: Orthopedics;  Laterality: Left;  . Total knee arthroplasty Right 05/15/2015    Procedure: RIGHT TOTAL KNEE ARTHROPLASTY;  Surgeon: Eugenia Mcalpine, MD;  Location: [facility];  Service: Orthopedics;  Laterality: Right;    Social History   Social History  . Marital Status: Married    Spouse Name: N/A  . Number of Children: N/A  . Years of Education: N/A   Occupational History  . Not on  file.   Social History Main Topics  . Smoking status: Never Smoker   . Smokeless tobacco: Never Used  . Alcohol Use: Yes     Comment: rare  . Drug Use: No  . Sexual Activity: Not on file   Other Topics Concern  . Not on file   Social History Narrative    Current Outpatient Prescriptions on File Prior to Visit  Medication Sig Dispense Refill  . Calcium-Vitamin D (CALTRATE 600 PLUS-VIT D PO) Take 1 tablet by mouth every morning.     . Cholecalciferol (VITAMIN D) 2000 UNITS CAPS Take 2,000 Units by mouth every morning.     . Coenzyme Q10 (COQ10) 200 MG CAPS Take 200 mg by mouth at bedtime.     . diclofenac sodium (VOLTAREN) 1 % GEL Apply 1 application topically at bedtime as needed (knee pain.). Applied knees    . DULoxetine (CYMBALTA) 20 MG capsule Take 40 mg by mouth at bedtime.     . folic acid (FOLVITE) 800 MCG tablet Take 800 mcg by mouth at bedtime.     . methocarbamol (ROBAXIN) 500 MG tablet Take 1 tablet (500 mg total) by mouth every 8 (eight) hours as needed for muscle spasms. 50 tablet 2  . Multiple Vitamin (MULTIVITAMIN  WITH MINERALS) TABS tablet Take 1 tablet by mouth every morning.     . Omega-3 Fatty Acids (RA FISH OIL) 1400 MG CPDR Take 1,400 mg by mouth 2 (two) times daily.    Marland Kitchen. omeprazole (PRILOSEC) 20 MG capsule Take 20 mg by mouth every morning.     . ONE TOUCH ULTRA TEST test strip Use to to check blood sugar 2 times per day. 200 each 3  . oxyCODONE-acetaminophen (ROXICET) 5-325 MG tablet Take 1-2 tablets by mouth every 4 (four) hours as needed for severe pain. 60 tablet 0  . rosuvastatin (CRESTOR) 10 MG tablet Take 10 mg by mouth every morning.     . trospium (SANCTURA) 20 MG tablet Take 20 mg by mouth 2 (two) times daily.  3  . valsartan-hydrochlorothiazide (DIOVAN-HCT) 320-25 MG per tablet Take 0.5 tablets by mouth every morning.     Marland Kitchen. acetaminophen (TYLENOL ARTHRITIS PAIN) 650 MG CR tablet Take 1,300 mg by mouth at bedtime as needed for pain.    Marland Kitchen. aspirin EC 325  MG tablet Take 1 tablet (325 mg total) by mouth 2 (two) times daily. (Patient not taking: Reported on 06/19/2015) 30 tablet 0   No current facility-administered medications on file prior to visit.    Allergies  Allergen Reactions  . Colchicine     Stomach cramps  . Diclofenac     Oral causes stomach cramps  . Janumet [Sitagliptin-Metformin Hcl] Nausea And Vomiting    Lightheaded   . Septra [Sulfamethoxazole-Trimethoprim] Itching  . Trulicity [Dulaglutide] Diarrhea and Nausea And Vomiting  . Zetia [Ezetimibe]     Muscle pain  . Imipramine Rash  . Tape Rash    Paper tape ok to use     Family History  Problem Relation Age of Onset  . Diabetes Maternal Grandmother   . Diabetes Maternal Grandfather     BP 137/86 mmHg  Pulse 83  Temp(Src) 98.3 F (36.8 C) (Oral)  Ht 5\' 3"  (1.6 m)  SpO2 98%  Review of Systems She denies hypoglycemia.      Objective:   Physical Exam VITAL SIGNS:  See vs page GENERAL: no distress Pulses: dorsalis pedis intact bilat.   MSK: no deformity of the feet  CV: no leg edema  Skin: no ulcer on the feet, but the skin is dry. normal color and temp on the feet.  Neuro: sensation is intact to touch on the feet.        Assessment & Plan:  DM: we'll re-try metformin-XR at a low dosage, to see if we can go back to oral rx.   Patient is advised the following: Patient Instructions  i have sent a prescription to your pharmacy, for metformin-XR.   Please reduce the lantus to 5 units each morning.   Please call in a few days, to tell us how the blood sugar is doing.  If possible, we'll resume the [place] (at 1/2 pill per day), and stop the insulin.   check your blood sugar twice a day.  vary the time of day when you check, between before the 3 meals, and at bedtime.  also check if you have symptoms of your blood sugar being too high or too low.  please keep a record of the readings and bring it to your next appointment here.  You can write it on any  piece of paper.  please call us sooner if your blood sugar goes below 70, or if you have a lot of readings over  200.   Please come back for a follow-up appointment in 2 months.

## 2015-06-19 NOTE — Patient Instructions (Addendum)
i have sent a prescription to your pharmacy, for metformin-XR.   Please reduce the lantus to 5 units each morning.   Please call in a few days, to tell us how the blood sugar is doing.  If possible, we'll resume the [place] (at 1/2 pill per day), and stop the insulin.   check your blood sugar twice a day.  vary the time of day when you check, between before the 3 meals, and at bedtime.  also check if you have symptoms of your blood sugar being too high or too low.  please keep a record of the readings and bring it to your next appointment here.  You can write it on any piece of paper.  please call us sooner if your blood sugar goes below 70, or if you have a lot of readings over 200.   Please come back for a follow-up appointment in 2 months.

## 2015-06-22 ENCOUNTER — Telehealth: Payer: Self-pay | Admitting: Endocrinology

## 2015-06-22 MED ORDER — SITAGLIPTIN PHOSPHATE 100 MG PO TABS: 50.0000 mg | ORAL_TABLET | Freq: Every day | ORAL | Status: DC

## 2015-06-22 NOTE — Telephone Encounter (Signed)
Thanks D/c insulin i have sent a prescription to your pharmacy, to add [place]ia. i'll see you next time.

## 2015-06-22 NOTE — Telephone Encounter (Signed)
See note below and please advise, Thanks! 

## 2015-06-22 NOTE — Telephone Encounter (Signed)
Patient calling in her blood sugars per Dr. George HughEllison's request   11.19.16 183 7:05 pm  154 10:07 pm   11.20.16  141 9:57 am before breakfast  144 1:31 before lunch   11.21.16 156 8:43 am before breakfast   Please advise   Thank you

## 2015-06-22 NOTE — Telephone Encounter (Signed)
I contacted the pt and advised of the note below. Pt voiced understanding.  

## 2015-06-30 ENCOUNTER — Encounter: Payer: Self-pay | Admitting: Endocrinology

## 2015-06-30 ENCOUNTER — Other Ambulatory Visit: Payer: Self-pay | Admitting: Endocrinology

## 2015-06-30 MED ORDER — BROMOCRIPTINE MESYLATE 2.5 MG PO TABS: ORAL_TABLET | ORAL | Status: DC

## 2015-07-09 ENCOUNTER — Encounter: Payer: Self-pay | Admitting: Endocrinology

## 2015-07-17 ENCOUNTER — Encounter: Payer: Self-pay | Admitting: Endocrinology

## 2015-08-12 ENCOUNTER — Encounter: Payer: Self-pay | Admitting: Endocrinology

## 2015-08-19 ENCOUNTER — Ambulatory Visit (INDEPENDENT_AMBULATORY_CARE_PROVIDER_SITE_OTHER): Payer: Medicare Other | Admitting: Endocrinology

## 2015-08-19 ENCOUNTER — Encounter: Payer: Self-pay | Admitting: Endocrinology

## 2015-08-19 VITALS — BP 132/84 | HR 106 | Wt 184.0 lb

## 2015-08-19 DIAGNOSIS — E1121 Type 2 diabetes mellitus with diabetic nephropathy: Secondary | ICD-10-CM | POA: Diagnosis not present

## 2015-08-19 LAB — POCT GLYCOSYLATED HEMOGLOBIN (HGB A1C): HEMOGLOBIN A1C: 6.2

## 2015-08-19 NOTE — Progress Notes (Signed)
Subjective:    Patient ID: Pamela Love, female    DOB: 09-30-1950, 65 y.o.   MRN: [id number]  HPI Pt returns for f/u of diabetes mellitus: DM type: Insulin-requiring type 2 Dx'ed: 2012 Complications: renal insufficiency. Therapy: 3 oral meds GDM: never DKA: never Severe hypoglycemia: never Pancreatitis: never Other: she took insulin for 6 months, in 2016 Interval history: she brings a record of her cbg's which i have reviewed today.  It varies from 100-200, but most are in the mid-100's.  pt states she feels well in general.   Past Medical History  Diagnosis Date  . Hypertension   . Hyperlipidemia   . Arthritis   . Bursitis     hips  . Osteoporosis   . History of nonmelanoma skin cancer   . GERD (gastroesophageal reflux disease)   . OAB (overactive bladder)   . Diabetes mellitus without complication (HCC)     diet controlled - has Rx for Glymipride but has not started taking yet  . Complication of anesthesia     "spinal did not work with second c section"  . History of skin cancer     Past Surgical History  Procedure Laterality Date  . Cesarean section      x2  . Uterine fibroid surgery    . Abdominal hysterectomy  2005  . Eye surgery      bilat cataract surgery 2015  . Colonscopy     . Total knee arthroplasty Left 12/18/2014    Procedure: TOTAL LEFT KNEE ARTHROPLASTY;  Surgeon: Eugenia Mcalpine, MD;  Location: [facility];  Service: Orthopedics;  Laterality: Left;  . Total knee arthroplasty Right 05/15/2015    Procedure: RIGHT TOTAL KNEE ARTHROPLASTY;  Surgeon: Eugenia Mcalpine, MD;  Location: [facility];  Service: Orthopedics;  Laterality: Right;    Social History   Social History  . Marital Status: Married    Spouse Name: N/A  . Number of Children: N/A  . Years of Education: N/A   Occupational History  . Not on file.   Social History Main Topics  . Smoking status: Never Smoker   . Smokeless tobacco: Never Used  . Alcohol Use: Yes     Comment: rare  . Drug Use:  No  . Sexual Activity: Not on file   Other Topics Concern  . Not on file   Social History Narrative    Current Outpatient Prescriptions on File Prior to Visit  Medication Sig Dispense Refill  . acetaminophen (TYLENOL ARTHRITIS PAIN) 650 MG CR tablet Take 1,300 mg by mouth at bedtime as needed for pain.    . bromocriptine (PARLODEL) 2.5 MG tablet 1/4 tab daily 8 tablet 11  . Calcium-Vitamin D (CALTRATE 600 PLUS-VIT D PO) Take 1 tablet by mouth every morning.     . Cholecalciferol (VITAMIN D) 2000 UNITS CAPS Take 2,000 Units by mouth every morning.     . Coenzyme Q10 (COQ10) 200 MG CAPS Take 200 mg by mouth at bedtime.     . diclofenac sodium (VOLTAREN) 1 % GEL Apply 1 application topically at bedtime as needed (knee pain.). Applied knees    . DULoxetine (CYMBALTA) 20 MG capsule Take 40 mg by mouth at bedtime.     . folic acid (FOLVITE) 800 MCG tablet Take 800 mcg by mouth at bedtime.     . metFORMIN (GLUCOPHAGE-XR) 500 MG 24 hr tablet Take 1 tablet (500 mg total) by mouth daily with breakfast. 30 tablet 11  . methocarbamol (ROBAXIN) 500  MG tablet Take 1 tablet (500 mg total) by mouth every 8 (eight) hours as needed for muscle spasms. 50 tablet 2  . Multiple Vitamin (MULTIVITAMIN WITH MINERALS) TABS tablet Take 1 tablet by mouth every morning.     . Omega-3 Fatty Acids (RA FISH OIL) 1400 MG CPDR Take 1,400 mg by mouth 2 (two) times daily.    . omeprazole (PRIMarland KitchenLOSEC) 20 MG capsule Take 20 mg by mouth every morning.     . ONE TOUCH ULTRA TEST test strip Use to to check blood sugar 2 times per day. 200 each 3  . oxyCODONE-acetaminophen (ROXICET) 5-325 MG tablet Take 1-2 tablets by mouth every 4 (four) hours as needed for severe pain. 60 tablet 0  . rosuvastatin (CRESTOR) 10 MG tablet Take 10 mg by mouth every morning.     . sitaGLIPtin (JANUVIA) 100 MG tablet Take 0.5 tablets (50 mg total) by mouth daily. 15 tablet 11  . trospium (SANCTURA) 20 MG tablet Take 20 mg by mouth 2 (two) times daily.   3  . valsartan-hydrochlorothiazide (DIOVAN-HCT) 320-25 MG per tablet Take 0.5 tablets by mouth every morning.     Marland Kitchen aspirin EC 325 MG tablet Take 1 tablet (325 mg total) by mouth 2 (two) times daily. (Patient not taking: Reported on 06/19/2015) 30 tablet 0   No current facility-administered medications on file prior to visit.    Allergies  Allergen Reactions  . Colchicine     Stomach cramps  . Diclofenac     Oral causes stomach cramps  . Janumet [Sitagliptin-Metformin Hcl] Nausea And Vomiting    Lightheaded   . Septra [Sulfamethoxazole-Trimethoprim] Itching  . Trulicity [Dulaglutide] Diarrhea and Nausea And Vomiting  . Zetia [Ezetimibe]     Muscle pain  . Imipramine Rash  . Tape Rash    Paper tape ok to use     Family History  Problem Relation Age of Onset  . Diabetes Maternal Grandmother   . Diabetes Maternal Grandfather     BP 132/84 mmHg  Pulse 106  Wt 184 lb (83.462 kg)  SpO2 98%  Review of Systems She denies hypoglycemia.      Objective:   Physical Exam VITAL SIGNS:  See vs page GENERAL: no distress Pulses: dorsalis pedis intact bilat.   MSK: no deformity of the feet CV: no leg edema Skin:  no ulcer on the feet.  normal color and temp on the feet. Neuro: sensation is intact to touch on the feet   A1c=6.2%     Assessment & Plan:  DM: well-controlled.  Patient is advised the following: Patient Instructions  Please continue the same medications.  check your blood sugar once a day.  vary the time of day when you check, between before the 3 meals, and at bedtime.  also check if you have symptoms of your blood sugar being too high or too low.  please keep a record of the readings and bring it to your next appointment here (or you can bring the meter itself).  You can write it on any piece of paper.  please call us sooner if your blood sugar goes below 70, or if you have a lot of readings over 200.  Please come back for a follow-up appointment in 3-4 months.

## 2015-08-19 NOTE — Patient Instructions (Signed)
Please continue the same medications  °check your blood sugar once a day.  vary the time of day when you check, between before the 3 meals, and at bedtime.  also check if you have symptoms of your blood sugar being too high or too low.  please keep a record of the readings and bring it to your next appointment here (or you can bring the meter itself).  You can write it on any piece of paper.  please call us sooner if your blood sugar goes below 70, or if you have a lot of readings over 200. °Please come back for a follow-up appointment in 3-4 months.  °

## 2015-11-03 ENCOUNTER — Encounter: Payer: Self-pay | Admitting: Endocrinology

## 2015-11-16 ENCOUNTER — Encounter: Payer: Self-pay | Admitting: Endocrinology

## 2015-11-18 ENCOUNTER — Ambulatory Visit (INDEPENDENT_AMBULATORY_CARE_PROVIDER_SITE_OTHER): Payer: Medicare Other | Admitting: Endocrinology

## 2015-11-18 ENCOUNTER — Encounter: Payer: Self-pay | Admitting: Endocrinology

## 2015-11-18 VITALS — BP 118/78 | HR 91 | Temp 98.0°F | Ht 63.0 in | Wt 178.0 lb

## 2015-11-18 DIAGNOSIS — E1121 Type 2 diabetes mellitus with diabetic nephropathy: Secondary | ICD-10-CM

## 2015-11-18 DIAGNOSIS — N183 Chronic kidney disease, stage 3 (moderate): Secondary | ICD-10-CM | POA: Diagnosis not present

## 2015-11-18 DIAGNOSIS — E1122 Type 2 diabetes mellitus with diabetic chronic kidney disease: Secondary | ICD-10-CM | POA: Diagnosis not present

## 2015-11-18 LAB — POCT GLYCOSYLATED HEMOGLOBIN (HGB A1C): HEMOGLOBIN A1C: 5.8

## 2015-11-18 NOTE — Patient Instructions (Addendum)
Ou can stay off the metformin Please continue the same other medications.  check your blood sugar once a day.  vary the time of day when you check, between before the 3 meals, and at bedtime.  also check if you have symptoms of your blood sugar being too high or too low.  please keep a record of the readings and bring it to your next appointment here (or you can bring the meter itself).  You can write it on any piece of paper.  please call us sooner if your blood sugar goes below 70, or if you have a lot of readings over 200.  Please come back for a follow-up appointment in 4 months.

## 2015-11-18 NOTE — Progress Notes (Signed)
Subjective:    Patient ID: Pamela Love, female    DOB: Jan 17, 1951, 65 y.o.   MRN: [id number]  HPI Pt returns for f/u of diabetes mellitus: DM type: Insulin-requiring type 2 Dx'ed: 2012 Complications: renal insufficiency. Therapy: 2 oral meds GDM: never DKA: never Severe hypoglycemia: never Pancreatitis: never Other: she took insulin for 6 months, in 2016; renal insuff limits oral options.   Interval history: she brings a record of her cbg's which i have reviewed today.   All are in the100's.  pt states she feels well in general.  She has been off metformin for the past weeks.   Past Medical History  Diagnosis Date  . Hypertension   . Hyperlipidemia   . Arthritis   . Bursitis     hips  . Osteoporosis   . History of nonmelanoma skin cancer   . GERD (gastroesophageal reflux disease)   . OAB (overactive bladder)   . Diabetes mellitus without complication (HCC)     diet controlled - has Rx for Glymipride but has not started taking yet  . Complication of anesthesia     "spinal did not work with second c section"  . History of skin cancer     Past Surgical History  Procedure Laterality Date  . Cesarean section      x2  . Uterine fibroid surgery    . Abdominal hysterectomy  2005  . Eye surgery      bilat cataract surgery 2015  . Colonscopy     . Total knee arthroplasty Left 12/18/2014    Procedure: TOTAL LEFT KNEE ARTHROPLASTY;  Surgeon: Eugenia Mcalpine, MD;  Location: [facility];  Service: Orthopedics;  Laterality: Left;  . Total knee arthroplasty Right 05/15/2015    Procedure: RIGHT TOTAL KNEE ARTHROPLASTY;  Surgeon: Eugenia Mcalpine, MD;  Location: [facility];  Service: Orthopedics;  Laterality: Right;    Social History   Social History  . Marital Status: Married    Spouse Name: N/A  . Number of Children: N/A  . Years of Education: N/A   Occupational History  . Not on file.   Social History Main Topics  . Smoking status: Never Smoker   . Smokeless tobacco: Never Used    . Alcohol Use: Yes     Comment: rare  . Drug Use: No  . Sexual Activity: Not on file   Other Topics Concern  . Not on file   Social History Narrative    Current Outpatient Prescriptions on File Prior to Visit  Medication Sig Dispense Refill  . acetaminophen (TYLENOL ARTHRITIS PAIN) 650 MG CR tablet Take 1,300 mg by mouth at bedtime as needed for pain.    . bromocriptine (PARLODEL) 2.5 MG tablet 1/4 tab daily 8 tablet 11  . Calcium-Vitamin D (CALTRATE 600 PLUS-VIT D PO) Take 1 tablet by mouth every morning.     . Cholecalciferol (VITAMIN D) 2000 UNITS CAPS Take 2,000 Units by mouth every morning.     . Coenzyme Q10 (COQ10) 200 MG CAPS Take 200 mg by mouth at bedtime.     . diclofenac sodium (VOLTAREN) 1 % GEL Apply 1 application topically at bedtime as needed (knee pain.). Applied knees    . DULoxetine (CYMBALTA) 20 MG capsule Take 40 mg by mouth at bedtime.     . folic acid (FOLVITE) 800 MCG tablet Take 800 mcg by mouth at bedtime.     . Multiple Vitamin (MULTIVITAMIN WITH MINERALS) TABS tablet Take 1 tablet by mouth every  morning.     . Omega-3 Fatty Acids (RA FISH OIL) 1400 MG CPDR Take 1,400 mg by mouth 2 (two) times daily.    Marland Kitchen. omeprazole (PRILOSEC) 20 MG capsule Take 20 mg by mouth every morning.     . ONE TOUCH ULTRA TEST test strip Use to to check blood sugar 2 times per day. 200 each 3  . rosuvastatin (CRESTOR) 10 MG tablet Take 10 mg by mouth every morning.     . sitaGLIPtin (JANUVIA) 100 MG tablet Take 0.5 tablets (50 mg total) by mouth daily. 15 tablet 11  . trospium (SANCTURA) 20 MG tablet Take 20 mg by mouth 2 (two) times daily.  3  . valsartan-hydrochlorothiazide (DIOVAN-HCT) 320-25 MG per tablet Take 0.5 tablets by mouth every morning.      No current facility-administered medications on file prior to visit.    Allergies  Allergen Reactions  . Colchicine     Stomach cramps  . Diclofenac     Oral causes stomach cramps  . Janumet [Sitagliptin-Metformin Hcl]  Nausea And Vomiting    Lightheaded   . Septra [Sulfamethoxazole-Trimethoprim] Itching  . Trulicity [Dulaglutide] Diarrhea and Nausea And Vomiting  . Zetia [Ezetimibe]     Muscle pain  . Imipramine Rash  . Tape Rash    Paper tape ok to use     Family History  Problem Relation Age of Onset  . Diabetes Maternal Grandmother   . Diabetes Maternal Grandfather     BP 118/78 mmHg  Pulse 91  Temp(Src) 98 F (36.7 C) (Oral)  Ht 5\' 3"  (1.6 m)  Wt 178 lb (80.74 kg)  BMI 31.54 kg/m2  SpO2 99%  Review of Systems She denies hypoglycemia.      Objective:   Physical Exam VITAL SIGNS:  See vs page GENERAL: no distress Pulses: dorsalis pedis intact bilat.   MSK: no deformity of the feet CV: no leg edema Skin:  no ulcer on the feet.  normal color and temp on the feet. Neuro: sensation is intact to touch on the feet   A1c=5.8%    Assessment & Plan:  DM: well-controlled  Patient is advised the following: Patient Instructions  Ou can stay off the metformin Please continue the same other medications.  check your blood sugar once a day.  vary the time of day when you check, between before the 3 meals, and at bedtime.  also check if you have symptoms of your blood sugar being too high or too low.  please keep a record of the readings and bring it to your next appointment here (or you can bring the meter itself).  You can write it on any piece of paper.  please call us sooner if your blood sugar goes below 70, or if you have a lot of readings over 200.  Please come back for a follow-up appointment in 4 months.

## 2015-11-19 DIAGNOSIS — E119 Type 2 diabetes mellitus without complications: Secondary | ICD-10-CM | POA: Insufficient documentation

## 2015-11-19 HISTORY — DX: Type 2 diabetes mellitus without complications: E11.9

## 2016-02-10 ENCOUNTER — Other Ambulatory Visit: Payer: Self-pay | Admitting: Gastroenterology

## 2016-03-18 ENCOUNTER — Encounter: Payer: Self-pay | Admitting: Endocrinology

## 2016-03-18 ENCOUNTER — Ambulatory Visit (INDEPENDENT_AMBULATORY_CARE_PROVIDER_SITE_OTHER): Payer: Medicare Other | Admitting: Endocrinology

## 2016-03-18 VITALS — BP 110/80 | HR 104 | Ht 63.0 in | Wt 181.0 lb

## 2016-03-18 DIAGNOSIS — E1122 Type 2 diabetes mellitus with diabetic chronic kidney disease: Secondary | ICD-10-CM

## 2016-03-18 DIAGNOSIS — N183 Chronic kidney disease, stage 3 (moderate): Secondary | ICD-10-CM

## 2016-03-18 LAB — POCT GLYCOSYLATED HEMOGLOBIN (HGB A1C): HEMOGLOBIN A1C: 6.1

## 2016-03-18 NOTE — Progress Notes (Signed)
Subjective:    Patient ID: Pamela Love, female    DOB: 10/19/1950, 65 y.o.   MRN: [id number]  HPI Pt returns for f/u of diabetes mellitus: DM type: Insulin-requiring type 2 Dx'ed: 2012 Complications: renal insufficiency. Therapy: 2 oral meds GDM: never DKA: never.  Severe hypoglycemia: never.  Pancreatitis: never.  Other: she took insulin for 6 months, in 2016; renal insuff limits oral options.   Interval history: no cbg record, but states cbg's are well-controlled.  pt states she feels well in general.   Past Medical History:  Diagnosis Date  . Arthritis   . Bursitis    hips  . Complication of anesthesia    "spinal did not work with second c section"  . Diabetes mellitus without complication (HCC)    diet controlled - has Rx for Glymipride but has not started taking yet  . GERD (gastroesophageal reflux disease)   . History of nonmelanoma skin cancer   . History of skin cancer   . Hyperlipidemia   . Hypertension   . OAB (overactive bladder)   . Osteoporosis     Past Surgical History:  Procedure Laterality Date  . ABDOMINAL HYSTERECTOMY  2005  . CESAREAN SECTION     x2  . colonscopy     . EYE SURGERY     bilat cataract surgery 2015  . TOTAL KNEE ARTHROPLASTY Left 12/18/2014   Procedure: TOTAL LEFT KNEE ARTHROPLASTY;  Surgeon: Eugenia Mcalpineobert Collins, MD;  Location: [facility];  Service: Orthopedics;  Laterality: Left;  . TOTAL KNEE ARTHROPLASTY Right 05/15/2015   Procedure: RIGHT TOTAL KNEE ARTHROPLASTY;  Surgeon: Eugenia Mcalpineobert Collins, MD;  Location: [facility];  Service: Orthopedics;  Laterality: Right;  . UTERINE FIBROID SURGERY      Social History   Social History  . Marital status: Married    Spouse name: N/A  . Number of children: N/A  . Years of education: N/A   Occupational History  . Not on file.   Social History Main Topics  . Smoking status: Never Smoker  . Smokeless tobacco: Never Used  . Alcohol use Yes     Comment: rare  . Drug use: No  . Sexual activity: Not  on file   Other Topics Concern  . Not on file   Social History Narrative  . No narrative on file    Current Outpatient Prescriptions on File Prior to Visit  Medication Sig Dispense Refill  . acetaminophen (TYLENOL ARTHRITIS PAIN) 650 MG CR tablet Take 1,300 mg by mouth at bedtime as needed for pain.    . bromocriptine (PARLODEL) 2.5 MG tablet 1/4 tab daily 8 tablet 11  . Calcium-Vitamin D (CALTRATE 600 PLUS-VIT D PO) Take 1 tablet by mouth every morning.     . Cholecalciferol (VITAMIN D) 2000 UNITS CAPS Take 2,000 Units by mouth every morning.     . Coenzyme Q10 (COQ10) 200 MG CAPS Take 200 mg by mouth at bedtime.     . diclofenac sodium (VOLTAREN) 1 % GEL Apply 1 application topically at bedtime as needed (knee pain.). Applied knees    . DULoxetine (CYMBALTA) 20 MG capsule Take 40 mg by mouth at bedtime.     . folic acid (FOLVITE) 800 MCG tablet Take 800 mcg by mouth at bedtime.     . Multiple Vitamin (MULTIVITAMIN WITH MINERALS) TABS tablet Take 1 tablet by mouth every morning.     . Omega-3 Fatty Acids (RA FISH OIL) 1400 MG CPDR Take 1,400 mg by mouth  2 (two) times daily.    Marland Kitchen. omeprazole (PRILOSEC) 20 MG capsule Take 20 mg by mouth every morning.     . ONE TOUCH ULTRA TEST test strip Use to to check blood sugar 2 times per day. 200 each 3  . rosuvastatin (CRESTOR) 10 MG tablet Take 10 mg by mouth every morning.     . sitaGLIPtin (JANUVIA) 100 MG tablet Take 0.5 tablets (50 mg total) by mouth daily. 15 tablet 11  . trospium (SANCTURA) 20 MG tablet Take 20 mg by mouth 2 (two) times daily.  3  . valsartan-hydrochlorothiazide (DIOVAN-HCT) 320-25 MG per tablet Take 0.5 tablets by mouth every morning.      No current facility-administered medications on file prior to visit.     Allergies  Allergen Reactions  . Colchicine     Stomach cramps  . Diclofenac     Oral causes stomach cramps  . Janumet [Sitagliptin-Metformin Hcl] Nausea And Vomiting    Lightheaded   . Septra  [Sulfamethoxazole-Trimethoprim] Itching  . Trulicity [Dulaglutide] Diarrhea and Nausea And Vomiting  . Zetia [Ezetimibe]     Muscle pain  . Imipramine Rash  . Tape Rash    Paper tape ok to use     Family History  Problem Relation Age of Onset  . Diabetes Maternal Grandmother   . Diabetes Maternal Grandfather     BP 110/80   Pulse (!) 104   Ht 5\' 3"  (1.6 m)   Wt 181 lb (82.1 kg)   SpO2 96%   BMI 32.06 kg/m   Review of Systems She has gained a few lbs.     Objective:   Physical Exam VITAL SIGNS:  See vs page GENERAL: no distress Pulses: dorsalis pedis intact bilat.   MSK: no deformity of the feet CV: no leg edema Skin:  no ulcer on the feet.  normal color and temp on the feet. Neuro: sensation is intact to touch on the feet   A1c=6.1%    Assessment & Plan:  Type 2 DM: well-controlled.   Renal insuff: this limits rx options.

## 2016-03-18 NOTE — Patient Instructions (Addendum)
Please continue the same januvia and bromocriptine.   check your blood sugar once a day.  vary the time of day when you check, between before the 3 meals, and at bedtime.  also check if you have symptoms of your blood sugar being too high or too low.  please keep a record of the readings and bring it to your next appointment here (or you can bring the meter itself).  You can write it on any piece of paper.  please call us sooner if your blood sugar goes below 70, or if you have a lot of readings over 200.   Please come back for a follow-up appointment in 6 months.

## 2016-03-29 ENCOUNTER — Other Ambulatory Visit: Payer: Self-pay | Admitting: Obstetrics and Gynecology

## 2016-03-29 DIAGNOSIS — Z1231 Encounter for screening mammogram for malignant neoplasm of breast: Secondary | ICD-10-CM

## 2016-05-11 ENCOUNTER — Ambulatory Visit
Admission: RE | Admit: 2016-05-11 | Discharge: 2016-05-11 | Disposition: A | Discharge status: Home / Self Care | Payer: Medicare Other | Source: Ambulatory Visit | Attending: Obstetrics and Gynecology | Admitting: Obstetrics and Gynecology

## 2016-05-11 DIAGNOSIS — Z1231 Encounter for screening mammogram for malignant neoplasm of breast: Secondary | ICD-10-CM

## 2016-05-30 ENCOUNTER — Other Ambulatory Visit: Payer: Self-pay | Admitting: Endocrinology

## 2016-07-12 ENCOUNTER — Other Ambulatory Visit: Payer: Self-pay | Admitting: Endocrinology

## 2016-09-04 ENCOUNTER — Other Ambulatory Visit: Payer: Self-pay | Admitting: Endocrinology

## 2016-09-19 ENCOUNTER — Ambulatory Visit: Payer: Medicare Other | Admitting: Endocrinology

## 2016-09-21 ENCOUNTER — Encounter: Payer: Self-pay | Admitting: Endocrinology

## 2016-09-21 ENCOUNTER — Ambulatory Visit (INDEPENDENT_AMBULATORY_CARE_PROVIDER_SITE_OTHER): Payer: Medicare Other | Admitting: Endocrinology

## 2016-09-21 VITALS — BP 132/86 | HR 102 | Ht 63.0 in | Wt 195.0 lb

## 2016-09-21 DIAGNOSIS — E1122 Type 2 diabetes mellitus with diabetic chronic kidney disease: Secondary | ICD-10-CM

## 2016-09-21 DIAGNOSIS — N183 Chronic kidney disease, stage 3 (moderate): Secondary | ICD-10-CM | POA: Diagnosis not present

## 2016-09-21 LAB — POCT GLYCOSYLATED HEMOGLOBIN (HGB A1C): Hemoglobin A1C: 6.5

## 2016-09-21 NOTE — Progress Notes (Signed)
Subjective:    Patient ID: Pamela Love, female    DOB: 09/13/1950, 66 y.o.   MRN: [id number]  HPI Pt returns for f/u of diabetes mellitus: DM type: 2 Dx'ed: 2012 Complications: renal insufficiency. Therapy: 2 oral meds GDM: never DKA: never.  Severe hypoglycemia: never.  Pancreatitis: never.  Other: she took insulin for 6 months, in 2016; renal insuff limits oral options.   Interval history: no cbg record, but states cbg's are well-controlled.  pt states she feels well in general.   Past Medical History:  Diagnosis Date  . Arthritis   . Bursitis    hips  . Complication of anesthesia    "spinal did not work with second c section"  . Diabetes mellitus without complication (HCC)    diet controlled - has Rx for Glymipride but has not started taking yet  . GERD (gastroesophageal reflux disease)   . History of nonmelanoma skin cancer   . History of skin cancer   . Hyperlipidemia   . Hypertension   . OAB (overactive bladder)   . Osteoporosis     Past Surgical History:  Procedure Laterality Date  . ABDOMINAL HYSTERECTOMY  2005  . CESAREAN SECTION     x2  . colonscopy     . EYE SURGERY     bilat cataract surgery 2015  . TOTAL KNEE ARTHROPLASTY Left 12/18/2014   Procedure: TOTAL LEFT KNEE ARTHROPLASTY;  Surgeon: Eugenia Mcalpineobert Collins, MD;  Location: [facility];  Service: Orthopedics;  Laterality: Left;  . TOTAL KNEE ARTHROPLASTY Right 05/15/2015   Procedure: RIGHT TOTAL KNEE ARTHROPLASTY;  Surgeon: Eugenia Mcalpineobert Collins, MD;  Location: [facility];  Service: Orthopedics;  Laterality: Right;  . UTERINE FIBROID SURGERY      Social History   Social History  . Marital status: Married    Spouse name: N/A  . Number of children: N/A  . Years of education: N/A   Occupational History  . Not on file.   Social History Main Topics  . Smoking status: Never Smoker  . Smokeless tobacco: Never Used  . Alcohol use Yes     Comment: rare  . Drug use: No  . Sexual activity: Not on file   Other  Topics Concern  . Not on file   Social History Narrative  . No narrative on file    Current Outpatient Prescriptions on File Prior to Visit  Medication Sig Dispense Refill  . acetaminophen (TYLENOL ARTHRITIS PAIN) 650 MG CR tablet Take 1,300 mg by mouth at bedtime as needed for pain.    . bromocriptine (PARLODEL) 2.5 MG tablet TAKE 1/4 TABLET EVERY DAY 8 tablet 10  . Calcium-Vitamin D (CALTRATE 600 PLUS-VIT D PO) Take 1 tablet by mouth every morning.     . Cholecalciferol (VITAMIN D) 2000 UNITS CAPS Take 2,000 Units by mouth every morning.     . Coenzyme Q10 (COQ10) 200 MG CAPS Take 200 mg by mouth at bedtime.     . diclofenac sodium (VOLTAREN) 1 % GEL Apply 1 application topically at bedtime as needed (knee pain.). Applied knees    . DULoxetine (CYMBALTA) 20 MG capsule Take 40 mg by mouth at bedtime.     . folic acid (FOLVITE) 800 MCG tablet Take 800 mcg by mouth at bedtime.     Marland Kitchen. JANUVIA 100 MG tablet TAKE 0.5 TABLET (50 MG TOTAL) BY MOUTH DAILY. 15 tablet 2  . Multiple Vitamin (MULTIVITAMIN WITH MINERALS) TABS tablet Take 1 tablet by mouth every morning.     .Marland Kitchen  Omega-3 Fatty Acids (RA FISH OIL) 1400 MG CPDR Take 1,400 mg by mouth 2 (two) times daily.    Marland Kitchen omeprazole (PRILOSEC) 20 MG capsule Take 20 mg by mouth 2 (two) times daily.     . ONE TOUCH ULTRA TEST test strip Use to to check blood sugar 2 times per day. 200 each 3  . rosuvastatin (CRESTOR) 10 MG tablet Take 10 mg by mouth every morning.     . trospium (SANCTURA) 20 MG tablet Take 20 mg by mouth 2 (two) times daily.  3  . valsartan-hydrochlorothiazide (DIOVAN-HCT) 320-25 MG per tablet Take 1 tablet by mouth every morning.      No current facility-administered medications on file prior to visit.     Allergies  Allergen Reactions  . Colchicine     Stomach cramps  . Diclofenac     Oral causes stomach cramps  . Janumet [Sitagliptin-Metformin Hcl] Nausea And Vomiting    Lightheaded   . Septra [Sulfamethoxazole-Trimethoprim]  Itching  . Trulicity [Dulaglutide] Diarrhea and Nausea And Vomiting  . Zetia [Ezetimibe]     Muscle pain  . Imipramine Rash  . Tape Rash    Paper tape ok to use     Family History  Problem Relation Age of Onset  . Diabetes Maternal Grandmother   . Diabetes Maternal Grandfather     BP 132/86   Pulse (!) 102   Ht 5\' 3"  (1.6 m)   Wt 195 lb (88.5 kg)   SpO2 98%   BMI 34.54 kg/m    Review of Systems She denies hypoglycemia.     Objective:   Physical Exam VITAL SIGNS:  See vs page GENERAL: no distress Pulses: dorsalis pedis intact bilat.   MSK: no deformity of the feet CV: no leg edema Skin:  no ulcer on the feet, but there are several heavy calluses.  normal color and temp on the feet. Neuro: sensation is intact to touch on the feet  A1c=6.5%     Assessment & Plan:  Type 2 DM, with renal insufficiency: well-controlled.   Patient is advised the following: Patient Instructions  Please continue the same januvia and bromocriptine.   check your blood sugar once a day.  vary the time of day when you check, between before the 3 meals, and at bedtime.  also check if you have symptoms of your blood sugar being too high or too low.  please keep a record of the readings and bring it to your next appointment here (or you can bring the meter itself).  You can write it on any piece of paper.  please call us sooner if your blood sugar goes below 70, or if you have a lot of readings over 200.   Please come back for a follow-up appointment in 6 months.

## 2016-09-21 NOTE — Patient Instructions (Signed)
Please continue the same januvia and bromocriptine.   check your blood sugar once a day.  vary the time of day when you check, between before the 3 meals, and at bedtime.  also check if you have symptoms of your blood sugar being too high or too low.  please keep a record of the readings and bring it to your next appointment here (or you can bring the meter itself).  You can write it on any piece of paper.  please call us sooner if your blood sugar goes below 70, or if you have a lot of readings over 200.   Please come back for a follow-up appointment in 6 months.   

## 2016-11-28 ENCOUNTER — Other Ambulatory Visit: Payer: Self-pay | Admitting: Endocrinology

## 2017-03-09 ENCOUNTER — Other Ambulatory Visit: Payer: Self-pay | Admitting: Endocrinology

## 2017-03-22 ENCOUNTER — Encounter: Payer: Self-pay | Admitting: Endocrinology

## 2017-03-22 ENCOUNTER — Ambulatory Visit (INDEPENDENT_AMBULATORY_CARE_PROVIDER_SITE_OTHER): Payer: Medicare Other | Admitting: Endocrinology

## 2017-03-22 VITALS — BP 142/92 | HR 95 | Wt 216.4 lb

## 2017-03-22 DIAGNOSIS — N183 Chronic kidney disease, stage 3 unspecified: Secondary | ICD-10-CM

## 2017-03-22 DIAGNOSIS — E1122 Type 2 diabetes mellitus with diabetic chronic kidney disease: Secondary | ICD-10-CM

## 2017-03-22 LAB — POCT GLYCOSYLATED HEMOGLOBIN (HGB A1C): Hemoglobin A1C: 7

## 2017-03-22 MED ORDER — SITAGLIPTIN PHOSPHATE 100 MG PO TABS: 50.0000 mg | ORAL_TABLET | Freq: Every day | ORAL | 11 refills | Status: DC

## 2017-03-22 MED ORDER — BROMOCRIPTINE MESYLATE 2.5 MG PO TABS: 1.2500 mg | ORAL_TABLET | Freq: Every day | ORAL | 11 refills | Status: DC

## 2017-03-22 NOTE — Patient Instructions (Addendum)
Please continue the same [place] and: Please increase the bromocriptine to 1/2 pill per day.  check your blood sugar once a day.  vary the time of day when you check, between before the 3 meals, and at bedtime.  also check if you have symptoms of your blood sugar being too high or too low.  please keep a record of the readings and bring it to your next appointment here (or you can bring the meter itself).  You can write it on any piece of paper.  please call us sooner if your blood sugar goes below 70, or if you have a lot of readings over 200.   Please come back for a follow-up appointment in 6 months.

## 2017-03-22 NOTE — Progress Notes (Signed)
Subjective:    Patient ID: Pamela Love, female    DOB: 1950-09-23, 66 y.o.   MRN: [id number]  HPI Pt returns for f/u of diabetes mellitus: DM type: 2 Dx'ed: 2012 Complications: renal insufficiency. Therapy: 2 oral meds GDM: never.  DKA: never.  Severe hypoglycemia: never.  Pancreatitis: never.  Other: she took insulin for 6 months, in 2016; renal insuff and edema limit rx options.   Interval history: she does not check cbg's.  pt states she feels well in general.  Past Medical History:  Diagnosis Date  . Arthritis   . Bursitis    hips  . Complication of anesthesia    "spinal did not work with second c section"  . Diabetes mellitus without complication (HCC)    diet controlled - has Rx for Glymipride but has not started taking yet  . GERD (gastroesophageal reflux disease)   . History of nonmelanoma skin cancer   . History of skin cancer   . Hyperlipidemia   . Hypertension   . OAB (overactive bladder)   . Osteoporosis     Past Surgical History:  Procedure Laterality Date  . ABDOMINAL HYSTERECTOMY  2005  . CESAREAN SECTION     x2  . colonscopy     . EYE SURGERY     bilat cataract surgery 2015  . TOTAL KNEE ARTHROPLASTY Left 12/18/2014   Procedure: TOTAL LEFT KNEE ARTHROPLASTY;  Surgeon: Eugenia Mcalpine, MD;  Location: [facility];  Service: Orthopedics;  Laterality: Left;  . TOTAL KNEE ARTHROPLASTY Right 05/15/2015   Procedure: RIGHT TOTAL KNEE ARTHROPLASTY;  Surgeon: Eugenia Mcalpine, MD;  Location: [facility];  Service: Orthopedics;  Laterality: Right;  . UTERINE FIBROID SURGERY      Social History   Social History  . Marital status: Married    Spouse name: N/A  . Number of children: N/A  . Years of education: N/A   Occupational History  . Not on file.   Social History Main Topics  . Smoking status: Never Smoker  . Smokeless tobacco: Never Used  . Alcohol use Yes     Comment: rare  . Drug use: No  . Sexual activity: Not on file   Other Topics Concern  . Not  on file   Social History Narrative  . No narrative on file    Current Outpatient Prescriptions on File Prior to Visit  Medication Sig Dispense Refill  . acetaminophen (TYLENOL ARTHRITIS PAIN) 650 MG CR tablet Take 1,300 mg by mouth at bedtime as needed for pain.    . Calcium-Vitamin D (CALTRATE 600 PLUS-VIT D PO) Take 1 tablet by mouth every morning.     . Cholecalciferol (VITAMIN D) 2000 UNITS CAPS Take 2,000 Units by mouth every morning.     . Coenzyme Q10 (COQ10) 200 MG CAPS Take 200 mg by mouth at bedtime.     . diclofenac sodium (VOLTAREN) 1 % GEL Apply 1 application topically at bedtime as needed (knee pain.). Applied knees    . DULoxetine (CYMBALTA) 20 MG capsule Take 40 mg by mouth at bedtime.     . folic acid (FOLVITE) 800 MCG tablet Take 800 mcg by mouth at bedtime.     . Multiple Vitamin (MULTIVITAMIN WITH MINERALS) TABS tablet Take 1 tablet by mouth every morning.     . Omega-3 Fatty Acids (RA FISH OIL) 1400 MG CPDR Take 1,400 mg by mouth 2 (two) times daily.    Marland Kitchen omeprazole (PRILOSEC) 20 MG capsule Take 20 mg  by mouth 2 (two) times daily.     . ONE TOUCH ULTRA TEST test strip Use to to check blood sugar 2 times per day. 200 each 3  . rosuvastatin (CRESTOR) 10 MG tablet Take 10 mg by mouth every morning.     . trospium (SANCTURA) 20 MG tablet Take 20 mg by mouth 2 (two) times daily.  3  . valsartan-hydrochlorothiazide (DIOVAN-HCT) 320-25 MG per tablet Take 1 tablet by mouth every morning.      No current facility-administered medications on file prior to visit.     Allergies  Allergen Reactions  . Colchicine     Stomach cramps  . Diclofenac     Oral causes stomach cramps  . Janumet [Sitagliptin-Metformin Hcl] Nausea And Vomiting    Lightheaded   . Septra [Sulfamethoxazole-Trimethoprim] Itching  . Trulicity [Dulaglutide] Diarrhea and Nausea And Vomiting  . Zetia [Ezetimibe]     Muscle pain  . Imipramine Rash  . Tape Rash    Paper tape ok to use     Family History   Problem Relation Age of Onset  . Diabetes Maternal Grandmother   . Diabetes Maternal Grandfather     BP (!) 142/92   Pulse 95   Wt 216 lb 6.4 oz (98.2 kg)   SpO2 96%   BMI 38.33 kg/m    Review of Systems She denies hypoglycemia.     Objective:   Physical Exam VITAL SIGNS:  See vs page GENERAL: no distress Pulses: foot pulses are intact bilaterally.   MSK: no deformity of the feet or ankles.  CV: trace bilat edema of the legs. Skin:  no ulcer on the feet or ankles.  normal color and temp on the feet and ankles.  Neuro: sensation is intact to touch on the feet and ankles.    Lab Results  Component Value Date   HGBA1C 7.0 03/22/2017       Assessment & Plan:  Type 2 DM, with renal insuff: worse Edema: this limits rx options  Patient Instructions  Please continue the same januvia and: Please increase the bromocriptine to 1/2 pill per day.  check your blood sugar once a day.  vary the time of day when you check, between before the 3 meals, and at bedtime.  also check if you have symptoms of your blood sugar being too high or too low.  please keep a record of the readings and bring it to your next appointment here (or you can bring the meter itself).  You can write it on any piece of paper.  please call us sooner if your blood sugar goes below 70, or if you have a lot of readings over 200.   Please come back for a follow-up appointment in 6 months.

## 2017-05-16 ENCOUNTER — Encounter: Payer: Self-pay | Admitting: Endocrinology

## 2017-05-18 ENCOUNTER — Other Ambulatory Visit: Payer: Self-pay | Admitting: Obstetrics and Gynecology

## 2017-05-18 DIAGNOSIS — Z1231 Encounter for screening mammogram for malignant neoplasm of breast: Secondary | ICD-10-CM

## 2017-06-12 ENCOUNTER — Ambulatory Visit: Payer: Medicare Other

## 2017-06-14 ENCOUNTER — Ambulatory Visit
Admission: RE | Admit: 2017-06-14 | Discharge: 2017-06-14 | Disposition: A | Discharge status: Home / Self Care | Payer: Medicare Other | Source: Ambulatory Visit | Attending: Obstetrics and Gynecology | Admitting: Obstetrics and Gynecology

## 2017-06-14 DIAGNOSIS — Z1231 Encounter for screening mammogram for malignant neoplasm of breast: Secondary | ICD-10-CM

## 2017-08-22 ENCOUNTER — Telehealth: Payer: Self-pay | Admitting: Endocrinology

## 2017-08-22 NOTE — Telephone Encounter (Signed)
Patient stated she do not feel good her b/s is high 295 she want to know what to do. Please advise sarah stated she will call at the end of the day.

## 2017-08-22 NOTE — Telephone Encounter (Signed)
8:45 AM tomorrow

## 2017-08-22 NOTE — Telephone Encounter (Signed)
I called patient & she coming in for appt tomorrow.

## 2017-08-23 ENCOUNTER — Ambulatory Visit: Payer: Medicare Other | Admitting: Endocrinology

## 2017-08-23 ENCOUNTER — Encounter: Payer: Self-pay | Admitting: Endocrinology

## 2017-08-23 ENCOUNTER — Telehealth: Payer: Self-pay | Admitting: Endocrinology

## 2017-08-23 VITALS — BP 134/78 | HR 111 | Wt 217.2 lb

## 2017-08-23 DIAGNOSIS — N183 Chronic kidney disease, stage 3 unspecified: Secondary | ICD-10-CM

## 2017-08-23 DIAGNOSIS — E1122 Type 2 diabetes mellitus with diabetic chronic kidney disease: Secondary | ICD-10-CM | POA: Diagnosis not present

## 2017-08-23 LAB — POCT GLYCOSYLATED HEMOGLOBIN (HGB A1C): Hemoglobin A1C: 9.3

## 2017-08-23 MED ORDER — REPAGLINIDE 0.5 MG PO TABS: 0.5000 mg | ORAL_TABLET | Freq: Three times a day (TID) | ORAL | 3 refills | Status: DC

## 2017-08-23 NOTE — Patient Instructions (Signed)
I have sent a prescription to your pharmacy, to add "repaglinide." check your blood sugar once a day.  vary the time of day when you check, between before the 3 meals, and at bedtime.  also check if you have symptoms of your blood sugar being too high or too low.  please keep a record of the readings and bring it to your next appointment here (or you can bring the meter itself).  You can write it on any piece of paper.  please call us sooner if your blood sugar goes below 70, or if you have a lot of readings over 200. Please come back for a follow-up appointment in 2 months.

## 2017-08-23 NOTE — Progress Notes (Signed)
Subjective:    Patient ID: Pamela Love, female    DOB: 03-27-1951, 67 y.o.   MRN: [id number]  HPI Pt returns for f/u of diabetes mellitus: DM type: 2 Dx'ed: 2012 Complications: renal insufficiency. Therapy: 2 oral meds GDM: never.  DKA: never.  Severe hypoglycemia: never.  Pancreatitis: never.  Other: she took insulin for 6 months, in 2016; renal insuff and edema limit rx options.   Interval history: pt says cbg was 293 yesterday.  She has been ill with URI, but she feels better now   Past Medical History:  Diagnosis Date  . Arthritis   . Bursitis    hips  . Complication of anesthesia    "spinal did not work with second c section"  . Diabetes mellitus without complication (HCC)    diet controlled - has Rx for Glymipride but has not started taking yet  . GERD (gastroesophageal reflux disease)   . History of nonmelanoma skin cancer   . History of skin cancer   . Hyperlipidemia   . Hypertension   . OAB (overactive bladder)   . Osteoporosis     Past Surgical History:  Procedure Laterality Date  . ABDOMINAL HYSTERECTOMY  2005  . CESAREAN SECTION     x2  . colonscopy     . EYE SURGERY     bilat cataract surgery 2015  . TOTAL KNEE ARTHROPLASTY Left 12/18/2014   Procedure: TOTAL LEFT KNEE ARTHROPLASTY;  Surgeon: Eugenia Mcalpine, MD;  Location: [facility];  Service: Orthopedics;  Laterality: Left;  . TOTAL KNEE ARTHROPLASTY Right 05/15/2015   Procedure: RIGHT TOTAL KNEE ARTHROPLASTY;  Surgeon: Eugenia Mcalpine, MD;  Location: [facility];  Service: Orthopedics;  Laterality: Right;  . UTERINE FIBROID SURGERY      Social History   Socioeconomic History  . Marital status: Married    Spouse name: Not on file  . Number of children: Not on file  . Years of education: Not on file  . Highest education level: Not on file  Social Needs  . Financial resource strain: Not on file  . Food insecurity - worry: Not on file  . Food insecurity - inability: Not on file  . Transportation needs -  medical: Not on file  . Transportation needs - non-medical: Not on file  Occupational History  . Not on file  Tobacco Use  . Smoking status: Never Smoker  . Smokeless tobacco: Never Used  Substance and Sexual Activity  . Alcohol use: Yes    Comment: rare  . Drug use: No  . Sexual activity: Not on file  Other Topics Concern  . Not on file  Social History Narrative  . Not on file    Current Outpatient Medications on File Prior to Visit  Medication Sig Dispense Refill  . acetaminophen (TYLENOL ARTHRITIS PAIN) 650 MG CR tablet Take 1,300 mg by mouth at bedtime as needed for pain.    . bromocriptine (PARLODEL) 2.5 MG tablet Take 0.5 tablets (1.25 mg total) by mouth daily. 15 tablet 11  . Calcium-Vitamin D (CALTRATE 600 PLUS-VIT D PO) Take 1 tablet by mouth every morning.     . Cholecalciferol (VITAMIN D) 2000 UNITS CAPS Take 2,000 Units by mouth every morning.     . Coenzyme Q10 (COQ10) 200 MG CAPS Take 200 mg by mouth at bedtime.     . diclofenac sodium (VOLTAREN) 1 % GEL Apply 1 application topically at bedtime as needed (knee pain.). Applied knees    . DULoxetine (  CYMBALTA) 20 MG capsule Take 40 mg by mouth at bedtime.     . folic acid (FOLVITE) 800 MCG tablet Take 800 mcg by mouth at bedtime.     . Multiple Vitamin (MULTIVITAMIN WITH MINERALS) TABS tablet Take 1 tablet by mouth every morning.     . naproxen (NAPROSYN) 250 MG tablet Take by mouth as needed.    . Omega-3 Fatty Acids (RA FISH OIL) 1400 MG CPDR Take 1,400 mg by mouth 2 (two) times daily.    Marland Kitchen. omeprazole (PRILOSEC) 20 MG capsule Take 20 mg by mouth 2 (two) times daily.     . ONE TOUCH ULTRA TEST test strip Use to to check blood sugar 2 times per day. 200 each 3  . rosuvastatin (CRESTOR) 10 MG tablet Take 10 mg by mouth every morning.     . sitaGLIPtin (JANUVIA) 100 MG tablet Take 0.5 tablets (50 mg total) by mouth daily. 15 tablet 11  . trospium (SANCTURA) 20 MG tablet Take 20 mg by mouth 2 (two) times daily.  3  .  valsartan-hydrochlorothiazide (DIOVAN-HCT) 320-25 MG per tablet Take 1 tablet by mouth every morning.      No current facility-administered medications on file prior to visit.     Allergies  Allergen Reactions  . Colchicine     Stomach cramps  . Diclofenac     Oral causes stomach cramps  . Janumet [Sitagliptin-Metformin Hcl] Nausea And Vomiting    Lightheaded   . Septra [Sulfamethoxazole-Trimethoprim] Itching  . Trulicity [Dulaglutide] Diarrhea and Nausea And Vomiting  . Zetia [Ezetimibe]     Muscle pain  . Imipramine Rash  . Tape Rash    Paper tape ok to use     Family History  Problem Relation Age of Onset  . Diabetes Maternal Grandmother   . Diabetes Maternal Grandfather     BP 134/78 (BP Location: Left Arm, Patient Position: Sitting, Cuff Size: Normal)   Pulse (!) 111   Wt 217 lb 3.2 oz (98.5 kg)   SpO2 92%   BMI 38.48 kg/m    Review of Systems No weight change    Objective:   Physical Exam VITAL SIGNS:  See vs page GENERAL: no distress Pulses: dorsalis pedis intact bilat.   MSK: no deformity of the feet CV: no leg edema Skin:  no ulcer on the feet.  normal color and temp on the feet. Neuro: sensation is intact to touch on the feet.    A1c=9.3%  Lab Results  Component Value Date   CREATININE 1.27 (H) 05/16/2015   BUN 21 (H) 05/16/2015   NA 136 05/16/2015   K 5.1 05/16/2015   CL 103 05/16/2015   CO2 27 05/16/2015      Assessment & Plan:  Type 2 DM: worse Renal insuff: this limits rx options  Patient Instructions  I have sent a prescription to your pharmacy, to add "repaglinide." check your blood sugar once a day.  vary the time of day when you check, between before the 3 meals, and at bedtime.  also check if you have symptoms of your blood sugar being too high or too low.  please keep a record of the readings and bring it to your next appointment here (or you can bring the meter itself).  You can write it on any piece of paper.  please call us  sooner if your blood sugar goes below 70, or if you have a lot of readings over 200. Please come back for a  follow-up appointment in 2 months.

## 2017-08-23 NOTE — Telephone Encounter (Signed)
I have called and clarified with patient.

## 2017-08-23 NOTE — Telephone Encounter (Signed)
Patient would like Maralyn SagoSarah to call her at ph# [phone] to clarify medication instructions

## 2017-09-20 ENCOUNTER — Ambulatory Visit: Payer: Medicare Other | Admitting: Endocrinology

## 2017-10-12 ENCOUNTER — Other Ambulatory Visit: Payer: Self-pay | Admitting: Endocrinology

## 2017-10-12 ENCOUNTER — Telehealth: Payer: Self-pay | Admitting: Endocrinology

## 2017-10-12 ENCOUNTER — Other Ambulatory Visit: Payer: Self-pay

## 2017-10-12 MED ORDER — NATEGLINIDE 60 MG PO TABS: 60.0000 mg | ORAL_TABLET | Freq: Three times a day (TID) | ORAL | 11 refills | Status: DC

## 2017-10-12 NOTE — Telephone Encounter (Signed)
Patient stated that medication Repaglinide she has been having swelling in knees, ankles and pain in all her joints. Please advise on what to do

## 2017-10-12 NOTE — Telephone Encounter (Signed)
Patient wanted to make sure that it was ok to take this medication with meals & not 15-20 minutes before. Prescription for repaglinide had stated to do that, but patient had trouble remembering to take medication until she actually sat down to eat.

## 2017-10-12 NOTE — Telephone Encounter (Signed)
Ok, I have sent a prescription to your pharmacy, to change to "nareglinide."

## 2017-10-12 NOTE — Telephone Encounter (Signed)
In my opinion, that makes it unnecessarily complicated.  Take just before eating.

## 2017-10-13 NOTE — Telephone Encounter (Signed)
I informed patient that it was in Dr. George HughEllison's opinion ok to take medication before meals.

## 2017-10-25 ENCOUNTER — Ambulatory Visit: Payer: Medicare Other | Admitting: Endocrinology

## 2017-10-25 ENCOUNTER — Encounter: Payer: Self-pay | Admitting: Endocrinology

## 2017-10-25 VITALS — BP 172/88 | HR 116 | Wt 221.0 lb

## 2017-10-25 DIAGNOSIS — N183 Chronic kidney disease, stage 3 unspecified: Secondary | ICD-10-CM

## 2017-10-25 DIAGNOSIS — E1122 Type 2 diabetes mellitus with diabetic chronic kidney disease: Secondary | ICD-10-CM

## 2017-10-25 LAB — POCT GLYCOSYLATED HEMOGLOBIN (HGB A1C): HEMOGLOBIN A1C: 8.1

## 2017-10-25 MED ORDER — NATEGLINIDE 120 MG PO TABS: 120.0000 mg | ORAL_TABLET | Freq: Three times a day (TID) | ORAL | 11 refills | Status: DC

## 2017-10-25 NOTE — Patient Instructions (Addendum)
Your blood pressure is high today.  Please see your primary care provider soon, to have it rechecked I have sent a prescription to your pharmacy, to double the nateglinide. Please continue the same other diabetes medications. check your blood sugar once a day.  vary the time of day when you check, between before the 3 meals, and at bedtime.  also check if you have symptoms of your blood sugar being too high or too low.  please keep a record of the readings and bring it to your next appointment here (or you can bring the meter itself).  You can write it on any piece of paper.  please call us sooner if your blood sugar goes below 70, or if you have a lot of readings over 200. Please come back for a follow-up appointment in 2 months.

## 2017-10-25 NOTE — Progress Notes (Signed)
Subjective:    Patient ID: Pamela Love, female    DOB: 1950-11-03, 67 y.o.   MRN: [id number]  HPI Pt returns for f/u of diabetes mellitus: DM type: 2 Dx'ed: 2012 Complications: renal insufficiency. Therapy: 3 oral meds.  GDM: never.  DKA: never.  Severe hypoglycemia: never.  Pancreatitis: never.  Other: she took insulin for 6 months, in 2016; renal insuff and edema limit rx options; she did not tolerate trulicity (nausea)  Interval history: she brings a record of her cbg's which I have reviewed today.  It varies from 109-200's.    Past Medical History:  Diagnosis Date  . Arthritis   . Bursitis    hips  . Complication of anesthesia    "spinal did not work with second c section"  . Diabetes mellitus without complication (HCC)    diet controlled - has Rx for Glymipride but has not started taking yet  . GERD (gastroesophageal reflux disease)   . History of nonmelanoma skin cancer   . History of skin cancer   . Hyperlipidemia   . Hypertension   . OAB (overactive bladder)   . Osteoporosis     Past Surgical History:  Procedure Laterality Date  . ABDOMINAL HYSTERECTOMY  2005  . CESAREAN SECTION     x2  . colonscopy     . EYE SURGERY     bilat cataract surgery 2015  . TOTAL KNEE ARTHROPLASTY Left 12/18/2014   Procedure: TOTAL LEFT KNEE ARTHROPLASTY;  Surgeon: Eugenia Mcalpine, MD;  Location: [facility];  Service: Orthopedics;  Laterality: Left;  . TOTAL KNEE ARTHROPLASTY Right 05/15/2015   Procedure: RIGHT TOTAL KNEE ARTHROPLASTY;  Surgeon: Eugenia Mcalpine, MD;  Location: [facility];  Service: Orthopedics;  Laterality: Right;  . UTERINE FIBROID SURGERY      Social History   Socioeconomic History  . Marital status: Married    Spouse name: Not on file  . Number of children: Not on file  . Years of education: Not on file  . Highest education level: Not on file  Occupational History  . Not on file  Social Needs  . Financial resource strain: Not on file  . Food insecurity:   Worry: Not on file    Inability: Not on file  . Transportation needs:    Medical: Not on file    Non-medical: Not on file  Tobacco Use  . Smoking status: Never Smoker  . Smokeless tobacco: Never Used  Substance and Sexual Activity  . Alcohol use: Yes    Comment: rare  . Drug use: No  . Sexual activity: Not on file  Lifestyle  . Physical activity:    Days per week: Not on file    Minutes per session: Not on file  . Stress: Not on file  Relationships  . Social connections:    Talks on phone: Not on file    Gets together: Not on file    Attends religious service: Not on file    Active member of club or organization: Not on file    Attends meetings of clubs or organizations: Not on file    Relationship status: Not on file  . Intimate partner violence:    Fear of current or ex partner: Not on file    Emotionally abused: Not on file    Physically abused: Not on file    Forced sexual activity: Not on file  Other Topics Concern  . Not on file  Social History Narrative  . Not  on file    Current Outpatient Medications on File Prior to Visit  Medication Sig Dispense Refill  . acetaminophen (TYLENOL ARTHRITIS PAIN) 650 MG CR tablet Take 1,300 mg by mouth at bedtime as needed for pain.    . bromocriptine (PARLODEL) 2.5 MG tablet Take 0.5 tablets (1.25 mg total) by mouth daily. 15 tablet 11  . Calcium-Vitamin D (CALTRATE 600 PLUS-VIT D PO) Take 1 tablet by mouth every morning.     . Cholecalciferol (VITAMIN D) 2000 UNITS CAPS Take 2,000 Units by mouth every morning.     . Coenzyme Q10 (COQ10) 200 MG CAPS Take 200 mg by mouth at bedtime.     . diclofenac sodium (VOLTAREN) 1 % GEL Apply 1 application topically at bedtime as needed (knee pain.). Applied knees    . DULoxetine (CYMBALTA) 20 MG capsule Take 40 mg by mouth at bedtime.     . folic acid (FOLVITE) 800 MCG tablet Take 800 mcg by mouth at bedtime.     . Multiple Vitamin (MULTIVITAMIN WITH MINERALS) TABS tablet Take 1 tablet by  mouth every morning.     . naproxen (NAPROSYN) 250 MG tablet Take by mouth as needed.    . Omega-3 Fatty Acids (RA FISH OIL) 1400 MG CPDR Take 1,400 mg by mouth 2 (two) times daily.    Marland Kitchen. omeprazole (PRILOSEC) 20 MG capsule Take 20 mg by mouth 2 (two) times daily.     . ONE TOUCH ULTRA TEST test strip Use to to check blood sugar 2 times per day. 200 each 3  . rosuvastatin (CRESTOR) 10 MG tablet Take 10 mg by mouth every morning.     . sitaGLIPtin (JANUVIA) 100 MG tablet Take 0.5 tablets (50 mg total) by mouth daily. 15 tablet 11  . trospium (SANCTURA) 20 MG tablet Take 20 mg by mouth 2 (two) times daily.  3  . valsartan-hydrochlorothiazide (DIOVAN-HCT) 320-25 MG per tablet Take 1 tablet by mouth every morning.      No current facility-administered medications on file prior to visit.     Allergies  Allergen Reactions  . Colchicine     Stomach cramps  . Diclofenac     Oral causes stomach cramps  . Janumet [Sitagliptin-Metformin Hcl] Nausea And Vomiting    Lightheaded   . Septra [Sulfamethoxazole-Trimethoprim] Itching  . Trulicity [Dulaglutide] Diarrhea and Nausea And Vomiting  . Zetia [Ezetimibe]     Muscle pain  . Imipramine Rash  . Tape Rash    Paper tape ok to use     Family History  Problem Relation Age of Onset  . Diabetes Maternal Grandmother   . Diabetes Maternal Grandfather     BP (!) 172/88 (BP Location: Left Arm, Patient Position: Sitting, Cuff Size: Normal)   Pulse (!) 116   Wt 221 lb (100.2 kg)   SpO2 95%   BMI 39.15 kg/m    Review of Systems She denies hypoglycemia.     Objective:   Physical Exam VITAL SIGNS:  See vs page GENERAL: no distress Pulses: dorsalis pedis intact bilat.   MSK: no deformity of the feet CV: trace bilat leg edema Skin:  no ulcer on the feet.  normal color and temp on the feet. Neuro: sensation is intact to touch on the feet  Lab Results  Component Value Date   CREATININE 1.27 (H) 05/16/2015   BUN 21 (H) 05/16/2015   NA 136  05/16/2015   K 5.1 05/16/2015   CL 103 05/16/2015   CO2  27 05/16/2015        Assessment & Plan:  HTN: is noted today.  Type 2 DM: she needs increased rx.  Renal insuff: this limits rx options.  Edema: this also limits rx options.   Patient Instructions  Your blood pressure is high today.  Please see your primary care provider soon, to have it rechecked I have sent a prescription to your pharmacy, to double the nateglinide. Please continue the same other diabetes medications. check your blood sugar once a day.  vary the time of day when you check, between before the 3 meals, and at bedtime.  also check if you have symptoms of your blood sugar being too high or too low.  please keep a record of the readings and bring it to your next appointment here (or you can bring the meter itself).  You can write it on any piece of paper.  please call us sooner if your blood sugar goes below 70, or if you have a lot of readings over 200. Please come back for a follow-up appointment in 2 months.

## 2017-12-27 ENCOUNTER — Encounter: Payer: Self-pay | Admitting: Endocrinology

## 2017-12-27 ENCOUNTER — Ambulatory Visit: Payer: Medicare Other | Admitting: Endocrinology

## 2017-12-27 VITALS — BP 122/82 | HR 103 | Wt 227.6 lb

## 2017-12-27 DIAGNOSIS — N183 Chronic kidney disease, stage 3 (moderate): Secondary | ICD-10-CM

## 2017-12-27 DIAGNOSIS — E1122 Type 2 diabetes mellitus with diabetic chronic kidney disease: Secondary | ICD-10-CM

## 2017-12-27 LAB — POCT GLYCOSYLATED HEMOGLOBIN (HGB A1C): Hemoglobin A1C: 9.1 % — AB (ref 4.0–5.6)

## 2017-12-27 MED ORDER — DAPAGLIFLOZIN PROPANEDIOL 5 MG PO TABS: 5.0000 mg | ORAL_TABLET | Freq: Every day | ORAL | 11 refills | Status: DC

## 2017-12-27 NOTE — Patient Instructions (Addendum)
I have sent a prescription to your pharmacy, to add "farxiga." Please redo the blood tests in 2 weeks. You do not need to be fasting.   If the blood sugar is is still high, we should add "ozempic."  Please continue the same other diabetes medications.  check your blood sugar once a day.  vary the time of day when you check, between before the 3 meals, and at bedtime.  also check if you have symptoms of your blood sugar being too high or too low.  please keep a record of the readings and bring it to your next appointment here (or you can bring the meter itself).  You can write it on any piece of paper.  please call us sooner if your blood sugar goes below 70, or if you have a lot of readings over 200. Please come back for a follow-up appointment in 2 months.

## 2017-12-27 NOTE — Progress Notes (Signed)
Subjective:    Patient ID: Pamela Love, female    DOB: [birth year], 67 y.o.   MRN: [id number]  HPI Pt returns for f/u of diabetes mellitus: DM type: 2 Dx'ed: 2012 Complications: renal insufficiency. Therapy: 3 oral meds.  GDM: never.  DKA: never.  Severe hypoglycemia: never.  Pancreatitis: never.  Other: she took insulin for 6 months, in 2016; renal insuff and edema limit rx options; she did not tolerate trulicity (nausea).  Interval history: she brings a record of her cbg's which I have reviewed today.  It varies from 156-300.  There is no trend throughout the day.   Past Medical History:  Diagnosis Date  . Arthritis   . Bursitis    hips  . Complication of anesthesia    "spinal did not work with second c section"  . Diabetes mellitus without complication (HCC)    diet controlled - has Rx for Glymipride but has not started taking yet  . GERD (gastroesophageal reflux disease)   . History of nonmelanoma skin cancer   . History of skin cancer   . Hyperlipidemia   . Hypertension   . OAB (overactive bladder)   . Osteoporosis     Past Surgical History:  Procedure Laterality Date  . ABDOMINAL HYSTERECTOMY  2005  . CESAREAN SECTION     x2  . colonscopy     . EYE SURGERY     bilat cataract surgery 2015  . TOTAL KNEE ARTHROPLASTY Left 12/18/2014   Procedure: TOTAL LEFT KNEE ARTHROPLASTY;  Surgeon: Eugenia Mcalpine, MD;  Location: [facility];  Service: Orthopedics;  Laterality: Left;  . TOTAL KNEE ARTHROPLASTY Right 05/15/2015   Procedure: RIGHT TOTAL KNEE ARTHROPLASTY;  Surgeon: Eugenia Mcalpine, MD;  Location: [facility];  Service: Orthopedics;  Laterality: Right;  . UTERINE FIBROID SURGERY      Social History   Socioeconomic History  . Marital status: Married    Spouse name: Not on file  . Number of children: Not on file  . Years of education: Not on file  . Highest education level: Not on file  Occupational History  . Not on file  Social Needs  . Financial resource strain:  Not on file  . Food insecurity:    Worry: Not on file    Inability: Not on file  . Transportation needs:    Medical: Not on file    Non-medical: Not on file  Tobacco Use  . Smoking status: Never Smoker  . Smokeless tobacco: Never Used  Substance and Sexual Activity  . Alcohol use: Yes    Comment: rare  . Drug use: No  . Sexual activity: Not on file  Lifestyle  . Physical activity:    Days per week: Not on file    Minutes per session: Not on file  . Stress: Not on file  Relationships  . Social connections:    Talks on phone: Not on file    Gets together: Not on file    Attends religious service: Not on file    Active member of club or organization: Not on file    Attends meetings of clubs or organizations: Not on file    Relationship status: Not on file  . Intimate partner violence:    Fear of current or ex partner: Not on file    Emotionally abused: Not on file    Physically abused: Not on file    Forced sexual activity: Not on file  Other Topics Concern  . Not  on file  Social History Narrative  . Not on file    Current Outpatient Medications on File Prior to Visit  Medication Sig Dispense Refill  . acetaminophen (TYLENOL ARTHRITIS PAIN) 650 MG CR tablet Take 1,300 mg by mouth at bedtime as needed for pain.    . bromocriptine (PARLODEL) 2.5 MG tablet Take 0.5 tablets (1.25 mg total) by mouth daily. 15 tablet 11  . Calcium-Vitamin D (CALTRATE 600 PLUS-VIT D PO) Take 1 tablet by mouth every morning.     . Cholecalciferol (VITAMIN D) 2000 UNITS CAPS Take 2,000 Units by mouth every morning.     . Coenzyme Q10 (COQ10) 200 MG CAPS Take 200 mg by mouth at bedtime.     . diclofenac sodium (VOLTAREN) 1 % GEL Apply 1 application topically at bedtime as needed (knee pain.). Applied knees    . DULoxetine (CYMBALTA) 20 MG capsule Take 40 mg by mouth at bedtime.     . folic acid (FOLVITE) 800 MCG tablet Take 800 mcg by mouth at bedtime.     . Multiple Vitamin (MULTIVITAMIN WITH  MINERALS) TABS tablet Take 1 tablet by mouth every morning.     . naproxen (NAPROSYN) 250 MG tablet Take by mouth as needed.    . nateglinide (STARLIX) 120 MG tablet Take 1 tablet (120 mg total) by mouth 3 (three) times daily with meals. 90 tablet 11  . Omega-3 Fatty Acids (RA FISH OIL) 1400 MG CPDR Take 1,400 mg by mouth 2 (two) times daily.    Marland Kitchen omeprazole (PRILOSEC) 20 MG capsule Take 20 mg by mouth 2 (two) times daily.     . ONE TOUCH ULTRA TEST test strip Use to to check blood sugar 2 times per day. 200 each 3  . rosuvastatin (CRESTOR) 10 MG tablet Take 10 mg by mouth every morning.     . sitaGLIPtin (JANUVIA) 100 MG tablet Take 0.5 tablets (50 mg total) by mouth daily. 15 tablet 11  . trospium (SANCTURA) 20 MG tablet Take 20 mg by mouth 2 (two) times daily.  3  . valsartan-hydrochlorothiazide (DIOVAN-HCT) 320-25 MG per tablet Take 1 tablet by mouth every morning.      No current facility-administered medications on file prior to visit.     Allergies  Allergen Reactions  . Colchicine     Stomach cramps  . Diclofenac     Oral causes stomach cramps  . Janumet [Sitagliptin-Metformin Hcl] Nausea And Vomiting    Lightheaded   . Septra [Sulfamethoxazole-Trimethoprim] Itching  . Trulicity [Dulaglutide] Diarrhea and Nausea And Vomiting  . Zetia [Ezetimibe]     Muscle pain  . Imipramine Rash  . Tape Rash    Paper tape ok to use     Family History  Problem Relation Age of Onset  . Diabetes Maternal Grandmother   . Diabetes Maternal Grandfather     BP 122/82   Pulse (!) 103   Wt 227 lb 9.6 oz (103.2 kg)   SpO2 95%   BMI 40.32 kg/m    Review of Systems She has gained weight    Objective:   Physical Exam VITAL SIGNS:  See vs page GENERAL: no distress Pulses: dorsalis pedis intact bilat.   MSK: no deformity of the feet CV: 2+ bilat leg edema Skin:  no ulcer on the feet.  normal color and temp on the feet. Neuro: sensation is intact to touch on the feet   Lab Results    Component Value Date   HGBA1C 9.1 (  A) 12/27/2017   Lab Results  Component Value Date   CREATININE 1.27 (H) 05/16/2015   BUN 21 (H) 05/16/2015   NA 136 05/16/2015   K 5.1 05/16/2015   CL 103 05/16/2015   CO2 27 05/16/2015       Assessment & Plan:  Type 2 DM: worse.  Renal insuff: this limits rx options and dosages.   Patient Instructions  I have sent a prescription to your pharmacy, to add "farxiga." Please redo the blood tests in 2 weeks. You do not need to be fasting.   If the blood sugar is is still high, we should add "ozempic."  Please continue the same other diabetes medications.  check your blood sugar once a day.  vary the time of day when you check, between before the 3 meals, and at bedtime.  also check if you have symptoms of your blood sugar being too high or too low.  please keep a record of the readings and bring it to your next appointment here (or you can bring the meter itself).  You can write it on any piece of paper.  please call us sooner if your blood sugar goes below 70, or if you have a lot of readings over 200. Please come back for a follow-up appointment in 2 months.

## 2017-12-29 ENCOUNTER — Telehealth: Payer: Self-pay | Admitting: Endocrinology

## 2017-12-29 MED ORDER — EMPAGLIFLOZIN 10 MG PO TABS: 10.0000 mg | ORAL_TABLET | Freq: Every day | ORAL | 11 refills | Status: DC

## 2017-12-29 NOTE — Telephone Encounter (Signed)
Patient stated that the Dr just recently sent her in a new prescription on Wednesday but her insurance will not cover this, She states that pharmacy was going to reach out to us to have this medication changed  Please advise

## 2017-12-29 NOTE — Telephone Encounter (Signed)
please call patient: Ins wants you to change farxiga to "jardiance." I have sent a prescription to your pharmacy 

## 2017-12-30 DIAGNOSIS — A0472 Enterocolitis due to Clostridium difficile, not specified as recurrent: Secondary | ICD-10-CM

## 2017-12-30 HISTORY — DX: Enterocolitis due to Clostridium difficile, not specified as recurrent: A04.72

## 2018-01-01 ENCOUNTER — Encounter: Payer: Self-pay | Admitting: Endocrinology

## 2018-01-02 NOTE — Telephone Encounter (Signed)
Patent stated that she as supposed to have labs done two weeks after starting the faxiga. She was wondering should she do the same with Jardiance? She has not been able to pick it up yet from pharmacy. She has a lab appt scheduled 6/12. Should this be changed & should she still have labs drawn?

## 2018-01-02 NOTE — Telephone Encounter (Signed)
Pt is aware and lab appointment will be made once she starts medication

## 2018-01-02 NOTE — Telephone Encounter (Signed)
Yes, please.

## 2018-01-10 ENCOUNTER — Other Ambulatory Visit: Payer: Medicare Other

## 2018-01-12 ENCOUNTER — Telehealth: Payer: Self-pay | Admitting: Endocrinology

## 2018-01-12 NOTE — Telephone Encounter (Signed)
error 

## 2018-01-12 NOTE — Telephone Encounter (Signed)
Pt stated that she will come in on Tuesday and have labs drawn and will hold off on appt which she said was Thursday until results are in

## 2018-01-12 NOTE — Telephone Encounter (Addendum)
Patient stated that she need clearance before she has dentist appointment monday for a Root Canal. Please call as soon as possible.  [phone]

## 2018-01-12 NOTE — Telephone Encounter (Signed)
Please advise 

## 2018-01-16 ENCOUNTER — Other Ambulatory Visit: Payer: Self-pay

## 2018-01-16 ENCOUNTER — Ambulatory Visit: Payer: Medicare Other

## 2018-01-16 ENCOUNTER — Telehealth: Payer: Self-pay | Admitting: Endocrinology

## 2018-01-16 MED ORDER — REPAGLINIDE 2 MG PO TABS: 2.0000 mg | ORAL_TABLET | Freq: Three times a day (TID) | ORAL | 3 refills | Status: DC

## 2018-01-16 NOTE — Telephone Encounter (Signed)
D/c januvia. Change nateglinide to "repaglinide" (stronger).  I have sent a prescription to your pharmacy

## 2018-01-16 NOTE — Telephone Encounter (Signed)
She was having the negative side effects from jardiance, so should she d/c that & keep [place]?

## 2018-01-16 NOTE — Telephone Encounter (Signed)
Sorry, I meant d/c jardiance, not [place]

## 2018-01-16 NOTE — Telephone Encounter (Signed)
Patient stated Pamela Love adjusted her jardiance and now she is having severe diarrhea, please advise

## 2018-01-16 NOTE — Telephone Encounter (Signed)
Back in March repaglinide was d/c due to swelling & the nateglinide was prescribed. Patient will d/c jardiance though.

## 2018-01-17 ENCOUNTER — Other Ambulatory Visit: Payer: Medicare Other

## 2018-01-20 ENCOUNTER — Other Ambulatory Visit: Payer: Self-pay | Admitting: Endocrinology

## 2018-01-22 ENCOUNTER — Telehealth: Payer: Self-pay

## 2018-01-22 NOTE — Telephone Encounter (Signed)
Ok, please take BID

## 2018-01-22 NOTE — Telephone Encounter (Signed)
Patient called today and is having problems with her medication- was diagnosed with c-diff and was taken off of Jardiance but is continuing Januvia, bromocriptine, and prandin when she can eat patient. Patient would like to know if she can take Prandin 3 times a day even if she only eats 2 times daily and continue this regimen until her next ov- unable to get labs drawn until July due to c-diff- please call the patient back as soon as you can to discuss the MD's advice

## 2018-01-22 NOTE — Telephone Encounter (Signed)
I have called & advised patient. She had no further questions at this time.

## 2018-02-28 ENCOUNTER — Ambulatory Visit: Payer: Medicare Other | Admitting: Endocrinology

## 2018-03-01 ENCOUNTER — Ambulatory Visit: Payer: Medicare Other | Admitting: Endocrinology

## 2018-03-01 VITALS — BP 130/74 | HR 98 | Temp 97.8°F | Ht 63.0 in | Wt 212.0 lb

## 2018-03-01 DIAGNOSIS — E1122 Type 2 diabetes mellitus with diabetic chronic kidney disease: Secondary | ICD-10-CM

## 2018-03-01 DIAGNOSIS — N183 Chronic kidney disease, stage 3 (moderate): Secondary | ICD-10-CM

## 2018-03-01 LAB — POCT GLYCOSYLATED HEMOGLOBIN (HGB A1C): HEMOGLOBIN A1C: 7.9 % — AB (ref 4.0–5.6)

## 2018-03-01 MED ORDER — EMPAGLIFLOZIN 10 MG PO TABS: 5.0000 mg | ORAL_TABLET | Freq: Every day | ORAL | 11 refills | Status: DC

## 2018-03-01 MED ORDER — NATEGLINIDE 120 MG PO TABS: 120.0000 mg | ORAL_TABLET | Freq: Three times a day (TID) | ORAL | 11 refills | Status: DC

## 2018-03-01 NOTE — Progress Notes (Signed)
Subjective:    Patient ID: Pamela Love, female    DOB: 1950/08/21, 67 y.o.   MRN: [id number]  HPI Pt returns for f/u of diabetes mellitus: DM type: 2 Dx'ed: 2012 Complications: renal insufficiency. Therapy: 4 oral meds.  GDM: never.  DKA: never.  Severe hypoglycemia: never.  Pancreatitis: never.  Other: she took insulin for 6 months, in 2016; renal insuff and edema limit rx options; she did not tolerate trulicity (nausea), or repaglinide (uncertain side effect).  Interval history: she brings a record of her cbg's which I have reviewed today.  It varies from 140-300.  She stopped jardiance, due to C Diff, which has since resolved.   Past Medical History:  Diagnosis Date  . Arthritis   . Bursitis    hips  . Complication of anesthesia    "spinal did not work with second c section"  . Diabetes mellitus without complication (HCC)    diet controlled - has Rx for Glymipride but has not started taking yet  . GERD (gastroesophageal reflux disease)   . History of nonmelanoma skin cancer   . History of skin cancer   . Hyperlipidemia   . Hypertension   . OAB (overactive bladder)   . Osteoporosis     Past Surgical History:  Procedure Laterality Date  . ABDOMINAL HYSTERECTOMY  2005  . CESAREAN SECTION     x2  . colonscopy     . EYE SURGERY     bilat cataract surgery 2015  . TOTAL KNEE ARTHROPLASTY Left 12/18/2014   Procedure: TOTAL LEFT KNEE ARTHROPLASTY;  Surgeon: Eugenia Mcalpine, MD;  Location: [facility];  Service: Orthopedics;  Laterality: Left;  . TOTAL KNEE ARTHROPLASTY Right 05/15/2015   Procedure: RIGHT TOTAL KNEE ARTHROPLASTY;  Surgeon: Eugenia Mcalpine, MD;  Location: [facility];  Service: Orthopedics;  Laterality: Right;  . UTERINE FIBROID SURGERY      Social History   Socioeconomic History  . Marital status: Married    Spouse name: Not on file  . Number of children: Not on file  . Years of education: Not on file  . Highest education level: Not on file  Occupational  History  . Not on file  Social Needs  . Financial resource strain: Not on file  . Food insecurity:    Worry: Not on file    Inability: Not on file  . Transportation needs:    Medical: Not on file    Non-medical: Not on file  Tobacco Use  . Smoking status: Never Smoker  . Smokeless tobacco: Never Used  Substance and Sexual Activity  . Alcohol use: Yes    Comment: rare  . Drug use: No  . Sexual activity: Not on file  Lifestyle  . Physical activity:    Days per week: Not on file    Minutes per session: Not on file  . Stress: Not on file  Relationships  . Social connections:    Talks on phone: Not on file    Gets together: Not on file    Attends religious service: Not on file    Active member of club or organization: Not on file    Attends meetings of clubs or organizations: Not on file    Relationship status: Not on file  . Intimate partner violence:    Fear of current or ex partner: Not on file    Emotionally abused: Not on file    Physically abused: Not on file    Forced sexual activity: Not  on file  Other Topics Concern  . Not on file  Social History Narrative  . Not on file    Current Outpatient Medications on File Prior to Visit  Medication Sig Dispense Refill  . acetaminophen (TYLENOL ARTHRITIS PAIN) 650 MG CR tablet Take 1,300 mg by mouth at bedtime as needed for pain.    . bromocriptine (PARLODEL) 2.5 MG tablet TAKE 0.5 TABLETS (1.25 MG TOTAL) BY MOUTH DAILY. 45 tablet 3  . Calcium-Vitamin D (CALTRATE 600 PLUS-VIT D PO) Take 1 tablet by mouth every morning.     . Cholecalciferol (VITAMIN D) 2000 UNITS CAPS Take 2,000 Units by mouth every morning.     . Coenzyme Q10 (COQ10) 200 MG CAPS Take 200 mg by mouth at bedtime.     . diclofenac sodium (VOLTAREN) 1 % GEL Apply 1 application topically at bedtime as needed (knee pain.). Applied knees    . DULoxetine (CYMBALTA) 20 MG capsule Take 40 mg by mouth at bedtime.     . folic acid (FOLVITE) 800 MCG tablet Take 800  mcg by mouth at bedtime.     . Multiple Vitamin (MULTIVITAMIN WITH MINERALS) TABS tablet Take 1 tablet by mouth every morning.     . naproxen (NAPROSYN) 250 MG tablet Take by mouth as needed.    . Omega-3 Fatty Acids (RA FISH OIL) 1400 MG CPDR Take 1,400 mg by mouth 2 (two) times daily.    Marland Kitchen. omeprazole (PRILOSEC) 20 MG capsule Take 20 mg by mouth 2 (two) times daily.     . ONE TOUCH ULTRA TEST test strip Use to to check blood sugar 2 times per day. 200 each 3  . rosuvastatin (CRESTOR) 10 MG tablet Take 10 mg by mouth every morning.     . trospium (SANCTURA) 20 MG tablet Take 20 mg by mouth 2 (two) times daily.  3  . valsartan-hydrochlorothiazide (DIOVAN-HCT) 320-25 MG per tablet Take 1 tablet by mouth every morning.     . [facility] DIGESTIVE PROBIOTIC 250 MG capsule Take by mouth daily.  0   No current facility-administered medications on file prior to visit.     Allergies  Allergen Reactions  . Colchicine     Stomach cramps  . Diclofenac     Oral causes stomach cramps  . Janumet [Sitagliptin-Metformin Hcl] Nausea And Vomiting    Lightheaded   . Septra [Sulfamethoxazole-Trimethoprim] Itching  . Trulicity [Dulaglutide] Diarrhea and Nausea And Vomiting  . Zetia [Ezetimibe]     Muscle pain  . Imipramine Rash  . Tape Rash    Paper tape ok to use     Family History  Problem Relation Age of Onset  . Diabetes Maternal Grandmother   . Diabetes Maternal Grandfather     BP 130/74 (BP Location: Left Arm, Patient Position: Sitting, Cuff Size: Large)   Pulse 98   Temp 97.8 F (36.6 C) (Oral)   Ht 5\' 3"  (1.6 m)   Wt 212 lb (96.2 kg)   SpO2 98%   BMI 37.55 kg/m    Review of Systems She has lost 15 lbs, since last ov.     Objective:   Physical Exam VITAL SIGNS:  See vs page GENERAL: no distress Pulses: dorsalis pedis intact bilat.   MSK: no deformity of the feet CV: 1+ bilat leg edema Skin:  no ulcer on the feet, but the skin is dry  normal color and temp on the feet.  Neuro:  sensation is intact to touch on the feet.  Lab Results  Component Value Date   HGBA1C 7.9 (A) 03/01/2018       Assessment & Plan:  Type 2 DM: she needs increased rx Renal insuff: we need to resume jardiance at a low dosage.  C. Diff: new to me.  As it is better now, she can resume jardiance  Patient Instructions  I have sent a prescription to your pharmacy, to resume jardiance at 5 mg daily.  Please continue the same other diabetes medications.  check your blood sugar once a day.  vary the time of day when you check, between before the 3 meals, and at bedtime.  also check if you have symptoms of your blood sugar being too high or too low.  please keep a record of the readings and bring it to your next appointment here (or you can bring the meter itself).  You can write it on any piece of paper.  please call us sooner if your blood sugar goes below 70, or if you have a lot of readings over 200. Please come back for a follow-up appointment in 2-3 months.

## 2018-03-01 NOTE — Patient Instructions (Signed)
I have sent a prescription to your pharmacy, to resume jardiance at 5 mg daily.  Please continue the same other diabetes medications.  check your blood sugar once a day.  vary the time of day when you check, between before the 3 meals, and at bedtime.  also check if you have symptoms of your blood sugar being too high or too low.  please keep a record of the readings and bring it to your next appointment here (or you can bring the meter itself).  You can write it on any piece of paper.  please call us sooner if your blood sugar goes below 70, or if you have a lot of readings over 200. Please come back for a follow-up appointment in 2-3 months.

## 2018-03-19 ENCOUNTER — Encounter: Payer: Self-pay | Admitting: Endocrinology

## 2018-03-30 ENCOUNTER — Other Ambulatory Visit: Payer: Self-pay | Admitting: Endocrinology

## 2018-04-01 ENCOUNTER — Encounter: Payer: Self-pay | Admitting: Endocrinology

## 2018-04-05 ENCOUNTER — Other Ambulatory Visit: Payer: Self-pay

## 2018-04-05 MED ORDER — ACCU-CHEK SOFT TOUCH LANCETS MISC: 12 refills | Status: DC

## 2018-04-11 ENCOUNTER — Telehealth: Payer: Self-pay | Admitting: Emergency Medicine

## 2018-04-11 ENCOUNTER — Other Ambulatory Visit: Payer: Self-pay

## 2018-04-11 MED ORDER — ONETOUCH DELICA LANCETS 33G MISC: 1.0000 | Freq: Two times a day (BID) | 11 refills | Status: DC

## 2018-04-11 NOTE — Telephone Encounter (Signed)
Pt called and stated some lancets were called in for her. She said they are the wrong lancets and do not fit her meter. Can she get the correct lancets sent in please. [facility] [address] Thanks.

## 2018-04-11 NOTE — Telephone Encounter (Signed)
Corrected and resent

## 2018-05-23 ENCOUNTER — Encounter: Payer: Self-pay | Admitting: Endocrinology

## 2018-05-23 ENCOUNTER — Ambulatory Visit: Payer: Medicare Other | Admitting: Endocrinology

## 2018-05-23 ENCOUNTER — Other Ambulatory Visit: Payer: Self-pay

## 2018-05-23 VITALS — BP 140/88 | HR 94 | Ht 63.0 in | Wt 217.0 lb

## 2018-05-23 DIAGNOSIS — E1122 Type 2 diabetes mellitus with diabetic chronic kidney disease: Secondary | ICD-10-CM | POA: Diagnosis not present

## 2018-05-23 DIAGNOSIS — N183 Chronic kidney disease, stage 3 (moderate): Secondary | ICD-10-CM

## 2018-05-23 LAB — POCT GLYCOSYLATED HEMOGLOBIN (HGB A1C): Hemoglobin A1C: 8.1 % — AB (ref 4.0–5.6)

## 2018-05-23 MED ORDER — INSULIN GLARGINE 100 UNIT/ML SOLOSTAR PEN: 10.0000 [IU] | PEN_INJECTOR | Freq: Every day | SUBCUTANEOUS | 99 refills | Status: DC

## 2018-05-23 MED ORDER — BASAGLAR KWIKPEN 100 UNIT/ML ~~LOC~~ SOPN: 10.0000 [IU] | PEN_INJECTOR | SUBCUTANEOUS | 11 refills | Status: DC

## 2018-05-23 NOTE — Telephone Encounter (Signed)
Prior auth completed for [employer] which was rejected. Per orders of Dr. Everardo All, Rx for [facility]us sent to [facility].

## 2018-05-23 NOTE — Patient Instructions (Addendum)
I have sent a prescription to your pharmacy, to add "basaglar," 10 units each morning.  Please continue the same other diabetes medications.  Please call or message [place] next week, to tell us how the blood sugar is doing.   check your blood sugar once a day.  vary the time of day when you check, between before the 3 meals, and at bedtime.  also check if you have symptoms of your blood sugar being too high or too low.  please keep a record of the readings and bring it to your next appointment here (or you can bring the meter itself).  You can write it on any piece of paper.  please call us sooner if your blood sugar goes below 70, or if you have a lot of readings over 200--especially after the steroid shot.   Please come back for a follow-up appointment in 3 months.

## 2018-05-23 NOTE — Progress Notes (Signed)
Subjective:    Patient ID: Pamela Love, female    DOB: March 11, 1951, 67 y.o.   MRN: [id number]  HPI Pt returns for f/u of diabetes mellitus: DM type: 2 Dx'ed: 2012 Complications: renal insufficiency.  Therapy: 3 oral meds.  GDM: never.  DKA: never.  Severe hypoglycemia: never.  Pancreatitis: never.  Other: she took insulin for 6 months, in 2016; renal insuff and edema limit rx options; she did not tolerate trulicity (nausea), or repaglinide (diarrhea).   Interval history: she brings a record of her cbg's which I have reviewed today.  It varies from 140-300.  She stopped jardiance, due to vaginitis.  She will soon have a steroid injection into the right shoulder, for tendonitis.   Past Medical History:  Diagnosis Date  . Arthritis   . Bursitis    hips  . Complication of anesthesia    "spinal did not work with second c section"  . Diabetes mellitus without complication (HCC)    diet controlled - has Rx for Glymipride but has not started taking yet  . GERD (gastroesophageal reflux disease)   . History of nonmelanoma skin cancer   . History of skin cancer   . Hyperlipidemia   . Hypertension   . OAB (overactive bladder)   . Osteoporosis     Past Surgical History:  Procedure Laterality Date  . ABDOMINAL HYSTERECTOMY  2005  . CESAREAN SECTION     x2  . colonscopy     . EYE SURGERY     bilat cataract surgery 2015  . TOTAL KNEE ARTHROPLASTY Left 12/18/2014   Procedure: TOTAL LEFT KNEE ARTHROPLASTY;  Surgeon: Eugenia Mcalpine, MD;  Location: [facility];  Service: Orthopedics;  Laterality: Left;  . TOTAL KNEE ARTHROPLASTY Right 05/15/2015   Procedure: RIGHT TOTAL KNEE ARTHROPLASTY;  Surgeon: Eugenia Mcalpine, MD;  Location: [facility];  Service: Orthopedics;  Laterality: Right;  . UTERINE FIBROID SURGERY      Social History   Socioeconomic History  . Marital status: Married    Spouse name: Not on file  . Number of children: Not on file  . Years of education: Not on file  . Highest  education level: Not on file  Occupational History  . Not on file  Social Needs  . Financial resource strain: Not on file  . Food insecurity:    Worry: Not on file    Inability: Not on file  . Transportation needs:    Medical: Not on file    Non-medical: Not on file  Tobacco Use  . Smoking status: Never Smoker  . Smokeless tobacco: Never Used  Substance and Sexual Activity  . Alcohol use: Yes    Comment: rare  . Drug use: No  . Sexual activity: Not on file  Lifestyle  . Physical activity:    Days per week: Not on file    Minutes per session: Not on file  . Stress: Not on file  Relationships  . Social connections:    Talks on phone: Not on file    Gets together: Not on file    Attends religious service: Not on file    Active member of club or organization: Not on file    Attends meetings of clubs or organizations: Not on file    Relationship status: Not on file  . Intimate partner violence:    Fear of current or ex partner: Not on file    Emotionally abused: Not on file    Physically abused: Not  on file    Forced sexual activity: Not on file  Other Topics Concern  . Not on file  Social History Narrative  . Not on file    Current Outpatient Medications on File Prior to Visit  Medication Sig Dispense Refill  . acetaminophen (TYLENOL ARTHRITIS PAIN) 650 MG CR tablet Take 1,300 mg by mouth at bedtime as needed for pain.    . bromocriptine (PARLODEL) 2.5 MG tablet TAKE 0.5 TABLETS (1.25 MG TOTAL) BY MOUTH DAILY. 45 tablet 3  . Calcium-Vitamin D (CALTRATE 600 PLUS-VIT D PO) Take 1 tablet by mouth every morning.     . Cholecalciferol (VITAMIN D) 2000 UNITS CAPS Take 2,000 Units by mouth every morning.     . Coenzyme Q10 (COQ10) 200 MG CAPS Take 200 mg by mouth at bedtime.     . [facility] DIGESTIVE PROBIOTIC 250 MG capsule Take by mouth daily.  0  . diclofenac sodium (VOLTAREN) 1 % GEL Apply 1 application topically at bedtime as needed (knee pain.). Applied knees    . DULoxetine  (CYMBALTA) 20 MG capsule Take 40 mg by mouth at bedtime.     . empagliflozin (JARDIANCE) 10 MG TABS tablet Take 5 mg by mouth daily. 15 tablet 11  . folic acid (FOLVITE) 800 MCG tablet Take 800 mcg by mouth at bedtime.     Marland Kitchen JANUVIA 100 MG tablet TAKE 0.5 TABLETS (50 MG TOTAL) BY MOUTH DAILY. 45 tablet 2  . Multiple Vitamin (MULTIVITAMIN WITH MINERALS) TABS tablet Take 1 tablet by mouth every morning.     . naproxen (NAPROSYN) 250 MG tablet Take by mouth as needed.    . nateglinide (STARLIX) 120 MG tablet Take 1 tablet (120 mg total) by mouth 3 (three) times daily with meals. 90 tablet 11  . Omega-3 Fatty Acids (RA FISH OIL) 1400 MG CPDR Take 1,400 mg by mouth 2 (two) times daily.    Marland Kitchen omeprazole (PRILOSEC) 20 MG capsule Take 20 mg by mouth 2 (two) times daily.     . ONE TOUCH ULTRA TEST test strip Use to to check blood sugar 2 times per day. 200 each 3  . ONETOUCH DELICA LANCETS 33G MISC 1 Package by Does not apply route 2 (two) times daily. Use to check blood sugars twice daily E11.65 200 each 11  . rosuvastatin (CRESTOR) 10 MG tablet Take 10 mg by mouth every morning.     . trospium (SANCTURA) 20 MG tablet Take 20 mg by mouth 2 (two) times daily.  3  . valsartan-hydrochlorothiazide (DIOVAN-HCT) 320-25 MG per tablet Take 1 tablet by mouth every morning.      No current facility-administered medications on file prior to visit.     Allergies  Allergen Reactions  . Colchicine     Stomach cramps  . Diclofenac     Oral causes stomach cramps  . Janumet [Sitagliptin-Metformin Hcl] Nausea And Vomiting    Lightheaded   . Septra [Sulfamethoxazole-Trimethoprim] Itching  . Trulicity [Dulaglutide] Diarrhea and Nausea And Vomiting  . Zetia [Ezetimibe]     Muscle pain  . Imipramine Rash  . Tape Rash    Paper tape ok to use     Family History  Problem Relation Age of Onset  . Diabetes Maternal Grandmother   . Diabetes Maternal Grandfather     BP 140/88   Pulse 94   Ht 5\' 3"  (1.6 m)   Wt  217 lb (98.4 kg)   SpO2 99%   BMI 38.44 kg/m  Review of Systems She denies hypoglycemia    Objective:   Physical Exam VITAL SIGNS:  See vs page GENERAL: no distress Pulses: dorsalis pedis intact bilat.   MSK: no deformity of the feet CV: 1+ bilat leg edema Skin:  no ulcer on the feet, but the skin is dry  normal color and temp on the feet.  Neuro: sensation is intact to touch on the feet.    Lab Results  Component Value Date   CREATININE 1.27 (H) 05/16/2015   BUN 21 (H) 05/16/2015   NA 136 05/16/2015   K 5.1 05/16/2015   CL 103 05/16/2015   CO2 27 05/16/2015   Lab Results  Component Value Date   HGBA1C 8.1 (A) 05/23/2018       Assessment & Plan:  Type 2 DM: worse.  We discussed.  She declines multiple daily injections.   Nausea/diarrhea: these limits rx options Edema: this also limits rx options.   Patient Instructions  I have sent a prescription to your pharmacy, to add "basaglar," 10 units each morning.  Please continue the same other diabetes medications.  Please call or message [place] next week, to tell us how the blood sugar is doing.   check your blood sugar once a day.  vary the time of day when you check, between before the 3 meals, and at bedtime.  also check if you have symptoms of your blood sugar being too high or too low.  please keep a record of the readings and bring it to your next appointment here (or you can bring the meter itself).  You can write it on any piece of paper.  please call us sooner if your blood sugar goes below 70, or if you have a lot of readings over 200--especially after the steroid shot.   Please come back for a follow-up appointment in 3 months.

## 2018-05-28 ENCOUNTER — Telehealth: Payer: Self-pay | Admitting: Dietician

## 2018-05-28 ENCOUNTER — Telehealth: Payer: Self-pay | Admitting: Endocrinology

## 2018-05-28 NOTE — Telephone Encounter (Signed)
What to do after safety producer? Clean needle? A new needle each time Storage? At room temp x up to1 month Swab belly with alcohol wipe? yes Pinch the skin when inserting? Lightly, yes Also, please offer appt with Bonita Quin or Vernona Rieger

## 2018-05-28 NOTE — Telephone Encounter (Signed)
Brief Nutrition Note: Called patient related to questions regarding insulin administration.  Patient has used insulin in the past.  She has looked up guidelines on line but still has questions. She verbalized appropriate storage and site rotation. Discussed that she should use a new needle each time and swab site with alcohol first.  Used needles should be placed in a hard plastic container such as a laundry detergent bottle.  Site options discussed as well as gently pinching prior to insertion. Patient has no further questions.  No appointment needed at this time.   Patient to call for further questions. Discussed that I am available as needed and if she would like to have a nutrition appointment or further education to please call for an appointment.  She did not wish to make one today.  Oran Rein, RD, LDN, CDE

## 2018-05-28 NOTE — Telephone Encounter (Signed)
Please advise 

## 2018-05-28 NOTE — Telephone Encounter (Signed)
Patient has called stating they sow Dr. Everardo All last week and was prescribed a new insulin. They are stating it has been several years since they have been on it and have a few questions. Listed below: What to do after safety producer? Clean needle? Storage? Swab belly with alcohol wipe? Pinch the skin when inserting? Ph # [phone]

## 2018-05-28 NOTE — Telephone Encounter (Signed)
Pt informed of these instructions and will forward to Vernona Rieger to have her scheduled

## 2018-05-30 LAB — TSH: TSH: 1.71 (ref 0.41–5.90)

## 2018-06-04 ENCOUNTER — Encounter (HOSPITAL_COMMUNITY): Payer: Self-pay | Admitting: Emergency Medicine

## 2018-06-04 ENCOUNTER — Emergency Department (HOSPITAL_COMMUNITY)
Admission: EM | Admit: 2018-06-04 | Discharge: 2018-06-04 | Disposition: A | Discharge status: Home / Self Care | Payer: Medicare Other | Attending: Emergency Medicine | Admitting: Emergency Medicine

## 2018-06-04 DIAGNOSIS — E119 Type 2 diabetes mellitus without complications: Secondary | ICD-10-CM | POA: Insufficient documentation

## 2018-06-04 DIAGNOSIS — M542 Cervicalgia: Secondary | ICD-10-CM | POA: Diagnosis not present

## 2018-06-04 DIAGNOSIS — M7918 Myalgia, other site: Secondary | ICD-10-CM | POA: Diagnosis not present

## 2018-06-04 DIAGNOSIS — Z79899 Other long term (current) drug therapy: Secondary | ICD-10-CM | POA: Insufficient documentation

## 2018-06-04 DIAGNOSIS — I10 Essential (primary) hypertension: Secondary | ICD-10-CM | POA: Diagnosis not present

## 2018-06-04 DIAGNOSIS — M791 Myalgia, unspecified site: Secondary | ICD-10-CM

## 2018-06-04 DIAGNOSIS — Z794 Long term (current) use of insulin: Secondary | ICD-10-CM | POA: Diagnosis not present

## 2018-06-04 MED ORDER — CYCLOBENZAPRINE HCL 10 MG PO TABS: 10.0000 mg | ORAL_TABLET | Freq: Two times a day (BID) | ORAL | 0 refills | Status: DC | PRN

## 2018-06-04 MED ORDER — METHYLPREDNISOLONE 4 MG PO TBPK: ORAL_TABLET | ORAL | 0 refills | Status: DC

## 2018-06-04 MED ORDER — CYCLOBENZAPRINE HCL 10 MG PO TABS
10.0000 mg | ORAL_TABLET | Freq: Once | ORAL | Status: AC
  Administered 2018-06-04: 10 mg via ORAL
  Filled 2018-06-04: qty 1

## 2018-06-04 MED ORDER — DEXAMETHASONE 4 MG PO TABS
10.0000 mg | ORAL_TABLET | Freq: Once | ORAL | Status: AC
  Administered 2018-06-04: 10 mg via ORAL
  Filled 2018-06-04: qty 2

## 2018-06-04 NOTE — ED Triage Notes (Signed)
Pt reports last week had pains everywhere and ws put on Prednisone which helped. Since finishing prednisone on Thursday had neck pains and right shoulder pains esp with movement or having to bend over.

## 2018-06-04 NOTE — ED Provider Notes (Signed)
[facility]-EMERGENCY DEPT Provider Note   CSN: [id number] Arrival date & time: 06/04/18  1034     History   Chief Complaint Chief Complaint  Patient presents with  . Neck Pain    HPI LESSLIE Love is a 67 y.o. female.  HPI   Shoulder pain, neck pain, chest pain and back pain for 2 weeks , to begin with was back of knees and waist at first.  Right shoulder going across the chest and to the neck. Worse with touching it. Aching all the time, movement makes it shooting pain.   If turn head one way or the other will shoot.   Have not lifted or done anything Took steroids, had great relief of symptoms, but 24hr after finishing it began to have symptoms again.   Right arm felt more weak than usual, no numbness No other numbess/weakness/difficulty talking/walking/change in vision, facial droop No dyspnea or cough  Past Medical History:  Diagnosis Date  . Arthritis   . Bursitis    hips  . Complication of anesthesia    "spinal did not work with second c section"  . Diabetes mellitus without complication (HCC)    diet controlled - has Rx for Glymipride but has not started taking yet  . GERD (gastroesophageal reflux disease)   . History of nonmelanoma skin cancer   . History of skin cancer   . Hyperlipidemia   . Hypertension   . OAB (overactive bladder)   . Osteoporosis     Patient Active Problem List   Diagnosis Date Noted  . Diabetes (HCC) 11/19/2015  . S/P total knee replacement using cement 05/15/2015  . Osteoarthritis of right knee 05/15/2015  . Primary osteoarthritis of left knee 12/18/2014  . S/P knee replacement 12/18/2014  . Osteoarthrosis, unspecified whether generalized or localized, involving lower leg 10/21/2014  . HTN (hypertension) 10/21/2014  . Dyslipidemia 10/21/2014    Past Surgical History:  Procedure Laterality Date  . ABDOMINAL HYSTERECTOMY  2005  . CESAREAN SECTION     x2  . colonscopy     . EYE SURGERY     bilat  cataract surgery 2015  . TOTAL KNEE ARTHROPLASTY Left 12/18/2014   Procedure: TOTAL LEFT KNEE ARTHROPLASTY;  Surgeon: Eugenia Mcalpine, MD;  Location: [facility];  Service: Orthopedics;  Laterality: Left;  . TOTAL KNEE ARTHROPLASTY Right 05/15/2015   Procedure: RIGHT TOTAL KNEE ARTHROPLASTY;  Surgeon: Eugenia Mcalpine, MD;  Location: [facility];  Service: Orthopedics;  Laterality: Right;  . UTERINE FIBROID SURGERY       OB History   None      Home Medications    Prior to Admission medications   Medication Sig Start Date End Date Taking? Authorizing Provider  acetaminophen (TYLENOL ARTHRITIS PAIN) 650 MG CR tablet Take 1,300 mg by mouth at bedtime as needed for pain.    [provider]  bromocriptine (PARLODEL) 2.5 MG tablet TAKE 0.5 TABLETS (1.25 MG TOTAL) BY MOUTH DAILY. 01/21/18   Romero Belling, MD  Calcium-Vitamin D (CALTRATE 600 PLUS-VIT D PO) Take 1 tablet by mouth every morning.     [provider]  Cholecalciferol (VITAMIN D) 2000 UNITS CAPS Take 2,000 Units by mouth every morning.     [provider]  Coenzyme Q10 (COQ10) 200 MG CAPS Take 200 mg by mouth at bedtime.     [provider]  [facility] DIGESTIVE PROBIOTIC 250 MG capsule Take by mouth daily. 01/18/18   [provider]  cyclobenzaprine (FLEXERIL)  10 MG tablet Take 1 tablet (10 mg total) by mouth 2 (two) times daily as needed for muscle spasms. 06/04/18   Alvira Monday, MD  diclofenac sodium (VOLTAREN) 1 % GEL Apply 1 application topically at bedtime as needed (knee pain.). Applied knees    [provider]  DULoxetine (CYMBALTA) 20 MG capsule Take 40 mg by mouth at bedtime.     [provider]  empagliflozin (JARDIANCE) 10 MG TABS tablet Take 5 mg by mouth daily. 03/01/18   Romero Belling, MD  folic acid (FOLVITE) 800 MCG tablet Take 800 mcg by mouth at bedtime.     [provider]  Insulin Glargine (LANTUS SOLOSTAR) 100 UNIT/ML Solostar Pen Inject 10 Units into the skin  daily. 05/23/18   Romero Belling, MD  JANUVIA 100 MG tablet TAKE 0.5 TABLETS (50 MG TOTAL) BY MOUTH DAILY. 03/30/18   Romero Belling, MD  methylPREDNISolone (MEDROL DOSEPAK) 4 MG TBPK tablet Follow instructions provided with Dosepak. 06/04/18   Alvira Monday, MD  Multiple Vitamin (MULTIVITAMIN WITH MINERALS) TABS tablet Take 1 tablet by mouth every morning.     [provider]  naproxen (NAPROSYN) 250 MG tablet Take by mouth as needed.    [provider]  nateglinide (STARLIX) 120 MG tablet Take 1 tablet (120 mg total) by mouth 3 (three) times daily with meals. 03/01/18   Romero Belling, MD  Omega-3 Fatty Acids (RA FISH OIL) 1400 MG CPDR Take 1,400 mg by mouth 2 (two) times daily.    [provider]  omeprazole (PRILOSEC) 20 MG capsule Take 20 mg by mouth 2 (two) times daily.     [provider]  ONE TOUCH ULTRA TEST test strip Use to to check blood sugar 2 times per day. 11/24/14   Romero Belling, MD  [facility] DELICA LANCETS 33G MISC 1 Package by Does not apply route 2 (two) times daily. Use to check blood sugars twice daily E11.65 04/11/18   Romero Belling, MD  rosuvastatin (CRESTOR) 10 MG tablet Take 10 mg by mouth every morning.     [provider]  trospium (SANCTURA) 20 MG tablet Take 20 mg by mouth 2 (two) times daily. 09/25/14   [provider]  valsartan-hydrochlorothiazide (DIOVAN-HCT) 320-25 MG per tablet Take 1 tablet by mouth every morning.     [provider]    Family History Family History  Problem Relation Age of Onset  . Diabetes Maternal Grandmother   . Diabetes Maternal Grandfather     Social History Social History   Tobacco Use  . Smoking status: Never Smoker  . Smokeless tobacco: Never Used  Substance Use Topics  . Alcohol use: Yes    Comment: rare  . Drug use: No     Allergies   Colchicine; Diclofenac; Janumet [sitagliptin-metformin hcl]; Septra [sulfamethoxazole-trimethoprim]; Trulicity [dulaglutide];  Zetia [ezetimibe]; Imipramine; and Tape   Review of Systems Review of Systems  Constitutional: Negative for fever.  HENT: Negative for sore throat.   Eyes: Negative for visual disturbance.  Respiratory: Negative for cough and shortness of breath.   Cardiovascular: Negative for chest pain.  Gastrointestinal: Negative for abdominal pain.  Genitourinary: Negative for difficulty urinating.  Musculoskeletal: Positive for neck pain. Negative for back pain.  Skin: Negative for rash.  Neurological: Positive for weakness. Negative for syncope, facial asymmetry, numbness and headaches.     Physical Exam Updated Vital Signs BP 110/65 (BP Location: Left Arm)   Pulse 98   Temp 97.6 F (36.4 C) (Oral)  Resp 17   SpO2 98%   Physical Exam  Constitutional: She is oriented to person, place, and time. She appears well-developed and well-nourished. No distress.  HENT:  Head: Normocephalic and atraumatic.  Eyes: Conjunctivae and EOM are normal.  Neck: Normal range of motion.  Cardiovascular: Normal rate, regular rhythm, normal heart sounds and intact distal pulses. Exam reveals no gallop and no friction rub.  No murmur heard. Pulmonary/Chest: Effort normal and breath sounds normal. No respiratory distress. She has no wheezes. She has no rales.  Abdominal: Soft. She exhibits no distension. There is no tenderness. There is no guarding.  Musculoskeletal: She exhibits no edema or tenderness.  Neurological: She is alert and oriented to person, place, and time.  Skin: Skin is warm and dry. No rash noted. She is not diaphoretic. No erythema.  Nursing note and vitals reviewed.    ED Treatments / Results  Labs (all labs ordered are listed, but only abnormal results are displayed) Labs Reviewed - No data to display  EKG EKG Interpretation  Date/Time:  Monday June 04 2018 12:21:00 EST Ventricular Rate:  119 PR Interval:    QRS Duration: 89 QT Interval:  316 QTC Calculation: 445 R  Axis:   -54 Text Interpretation:  Sinus tachycardia Left anterior fascicular block Low voltage, precordial leads Consider anterior infarct Baseline wander in lead(s) V1 V2 No significant change since last tracing Confirmed by Alvira Monday ([id number]) on 06/05/2018 5:26:47 AM   Radiology No results found.  Procedures Procedures (including critical care time)  Medications Ordered in ED Medications  dexamethasone (DECADRON) tablet 10 mg (10 mg Oral Given 06/04/18 1203)  cyclobenzaprine (FLEXERIL) tablet 10 mg (10 mg Oral Given 06/04/18 1203)     Initial Impression / Assessment and Plan / ED Course  I have reviewed the triage vital signs and the nursing notes.  Pertinent labs & imaging results that were available during my care of the patient were reviewed by me and considered in my medical decision making (see chart for details).   67 year old female presents with concern for right-sided neck shoulder pain.  EKG shows sinus tachycardia without other abnormalities.  Patient has no shortness of breath, no asymmetric leg swelling, have low suspicion for pulmonary embolus.  She had significant improvement with steroids.  Given severe pain to palpation, movements, feel pain is more most likely musculoskeletal in origin.  Differential includes polymyalgia rheumatica, cervical disc disease or radiculopathy, or other muscular strain.  Given description of pain significantly worse with movement and palpation, do not suspect other intrathoracic etiology of pain, including low suspicion for ACS, PE, or aortic dissection.  Patient has strong radial pulses bilaterally, no signs of acute arterial occlusion.  No history of trauma.  No weakness on exam or sign of cord compression.  Given her previous improvement of steroids with ddx including likely cervical radiculopathy, feel repeat steroid medrol dosepak appropriate.  Given rx for flexeril, recommend tylenol for pain. Recommend close follow up with PCP, consider  outpatient MRI. Patient discharged in stable condition with understanding of reasons to return.     Final Clinical Impressions(s) / ED Diagnoses   Final diagnoses:  Neck pain  Myalgia    ED Discharge Orders         Ordered    methylPREDNISolone (MEDROL DOSEPAK) 4 MG TBPK tablet     06/04/18 1139    cyclobenzaprine (FLEXERIL) 10 MG tablet  2 times daily PRN,   Status:  Discontinued     06/04/18 1139  cyclobenzaprine (FLEXERIL) 10 MG tablet  2 times daily PRN     06/04/18 1140           Alvira Monday, MD 06/05/18 [phone]

## 2018-06-11 ENCOUNTER — Other Ambulatory Visit: Payer: Self-pay | Admitting: Obstetrics and Gynecology

## 2018-06-11 DIAGNOSIS — Z1231 Encounter for screening mammogram for malignant neoplasm of breast: Secondary | ICD-10-CM

## 2018-06-15 ENCOUNTER — Other Ambulatory Visit: Payer: Self-pay

## 2018-06-15 ENCOUNTER — Telehealth: Payer: Self-pay | Admitting: Endocrinology

## 2018-06-15 MED ORDER — ONETOUCH ULTRA BLUE VI STRP: ORAL_STRIP | 3 refills | Status: DC

## 2018-06-15 NOTE — Telephone Encounter (Signed)
Sent to pt pharmacy as requested

## 2018-06-15 NOTE — Telephone Encounter (Signed)
Received notification from pt re: request to refill One Touch test strips. Request authorized and refill sent as requested.

## 2018-06-15 NOTE — Telephone Encounter (Signed)
Patient requests RX for One Touch Ultra Test Strips be sent to [facility] on [address]. Pharmacy told patient she needs to call our office for RX request.

## 2018-06-19 ENCOUNTER — Telehealth: Payer: Self-pay | Admitting: Endocrinology

## 2018-06-19 NOTE — Telephone Encounter (Signed)
Pt returned call.   CBG's: 06/12/18 through 06/19/18 range 150-305, with the highest reading being on 06/12/18 at bedtime. Monitors CBG's qd, various times of the day.  Took Prednisone until 06/11/18. Pt seen by Dr. Lendon ColonelHawks on 06/15/18 and suggested pt may have Lyme Disease. Since then has started Doxycycline (3 week course which she began on 06/15/18, 2 doses) and Naproxen 500 mg BID.

## 2018-06-19 NOTE — Telephone Encounter (Signed)
Please take an extra 5 units of basaglar now. Tomorrow am, start 15 units qam Please call or message us in 2-3 days, to tell us how the blood sugar is doing

## 2018-06-19 NOTE — Telephone Encounter (Signed)
Called pt and made her aware of Dr. Ellison's orders. Verbalized acceptance and understanding. 

## 2018-06-19 NOTE — Telephone Encounter (Signed)
This encounter has been closed as duplicate. Documentation has been placed in a previously opened encounter.

## 2018-06-19 NOTE — Telephone Encounter (Signed)
Returned pt call. LVM requesting returned call. 

## 2018-06-19 NOTE — Telephone Encounter (Signed)
Patient called re: please call patient at ph# [phone]-re: patient's blood sugar readings

## 2018-06-19 NOTE — Telephone Encounter (Signed)
Patient returning call. Please call patient at ph# [phone].

## 2018-06-22 ENCOUNTER — Telehealth: Payer: Self-pay | Admitting: Endocrinology

## 2018-06-22 NOTE — Telephone Encounter (Signed)
Returned pt call. LVM requesting returned call. 

## 2018-06-22 NOTE — Telephone Encounter (Signed)
Patient is calling to give her latest Blood sugar readings for Dr [name]

## 2018-06-25 ENCOUNTER — Telehealth: Payer: Self-pay | Admitting: Endocrinology

## 2018-06-25 NOTE — Telephone Encounter (Signed)
Patient is calling with blood sugar information   Please call her back at home number listed

## 2018-06-25 NOTE — Telephone Encounter (Signed)
Pt stated understanding of new instructions

## 2018-06-25 NOTE — Telephone Encounter (Signed)
please call patient: D/c nateglinide Increase basaglar to 20 units qam. Please call or message us next week, to tell us how the blood sugar is doing.

## 2018-06-25 NOTE — Telephone Encounter (Signed)
Returned pt call. States she began new order on 06/20/18 as follows: Please take an extra 5 units of basaglar now. Tomorrow am, start 15 units qam  CBG range: 06/20/18 through today: 123-207  Please advise if any new orders

## 2018-07-06 ENCOUNTER — Telehealth: Payer: Self-pay | Admitting: Endocrinology

## 2018-07-06 NOTE — Telephone Encounter (Signed)
Returned pt call. States she takes 20 units Lantus qAM and 1/2 tablet Januvia qAM. CBG's on 11/26 (lowest reading) 179 and 12/1 (highest reading) 230. Tday was 214 before breakfast. Please advise

## 2018-07-06 NOTE — Telephone Encounter (Signed)
Patient has called in regards to giving blood sugar readings. Please advise with patient, thanks.

## 2018-07-06 NOTE — Telephone Encounter (Signed)
D/c januvia Increase basaglar to 30 units qam. Please call or message us next week, to tell us how the blood sugar is doing.

## 2018-07-06 NOTE — Telephone Encounter (Signed)
Pt aware of instructions

## 2018-07-13 ENCOUNTER — Telehealth: Payer: Self-pay | Admitting: Endocrinology

## 2018-07-13 NOTE — Telephone Encounter (Signed)
Please call patient at ph# [phone] re: swelling in patient's ankles and feet to advise, per patient request.

## 2018-07-13 NOTE — Telephone Encounter (Signed)
Called pt to inquire further. Pt preferred to schedule an appt. Scheduled to be seen 07/16/18.

## 2018-07-16 ENCOUNTER — Ambulatory Visit: Payer: Medicare Other | Admitting: Endocrinology

## 2018-07-16 ENCOUNTER — Encounter: Payer: Self-pay | Admitting: Endocrinology

## 2018-07-16 VITALS — BP 142/92 | HR 77 | Ht 63.0 in | Wt 220.8 lb

## 2018-07-16 DIAGNOSIS — E1122 Type 2 diabetes mellitus with diabetic chronic kidney disease: Secondary | ICD-10-CM | POA: Diagnosis not present

## 2018-07-16 DIAGNOSIS — N183 Chronic kidney disease, stage 3 unspecified: Secondary | ICD-10-CM

## 2018-07-16 LAB — POCT GLYCOSYLATED HEMOGLOBIN (HGB A1C): Hemoglobin A1C: 8.5 % — AB (ref 4.0–5.6)

## 2018-07-16 MED ORDER — INSULIN GLARGINE 100 UNIT/ML SOLOSTAR PEN: 40.0000 [IU] | PEN_INJECTOR | SUBCUTANEOUS | 99 refills | Status: DC

## 2018-07-16 NOTE — Progress Notes (Signed)
Subjective:    Patient ID: Pamela Love, female    DOB: 05/13/1951, 67 y.o.   MRN: [id number]  HPI Pt returns for f/u of diabetes mellitus: DM type: Insulin-requiring type 2 Dx'ed: 2012 Complications: renal insufficiency.  Therapy: insulin since 2019 GDM: never.  DKA: never.  Severe hypoglycemia: never.  Pancreatitis: never.  Other: she also took insulin for 6 months, in 2016; renal insuff and edema limit rx options; she did not tolerate trulicity (nausea), or repaglinide (diarrhea).   Interval history: She has recently taken prednisone for myalgias.  She also has slight swelling of the ankles, but no assoc sob.  She takes 30 units qd, and parlodel.  she brings a record of her cbg's which I have reviewed today.  All are in the 200's Past Medical History:  Diagnosis Date  . Arthritis   . Bursitis    hips  . Complication of anesthesia    "spinal did not work with second c section"  . Diabetes mellitus without complication (HCC)    diet controlled - has Rx for Glymipride but has not started taking yet  . GERD (gastroesophageal reflux disease)   . History of nonmelanoma skin cancer   . History of skin cancer   . Hyperlipidemia   . Hypertension   . OAB (overactive bladder)   . Osteoporosis     Past Surgical History:  Procedure Laterality Date  . ABDOMINAL HYSTERECTOMY  2005  . CESAREAN SECTION     x2  . colonscopy     . EYE SURGERY     bilat cataract surgery 2015  . TOTAL KNEE ARTHROPLASTY Left 12/18/2014   Procedure: TOTAL LEFT KNEE ARTHROPLASTY;  Surgeon: Eugenia Mcalpineobert Collins, MD;  Location: [facility];  Service: Orthopedics;  Laterality: Left;  . TOTAL KNEE ARTHROPLASTY Right 05/15/2015   Procedure: RIGHT TOTAL KNEE ARTHROPLASTY;  Surgeon: Eugenia Mcalpineobert Collins, MD;  Location: [facility];  Service: Orthopedics;  Laterality: Right;  . UTERINE FIBROID SURGERY      Social History   Socioeconomic History  . Marital status: Married    Spouse name: Not on file  . Number of children: Not on  file  . Years of education: Not on file  . Highest education level: Not on file  Occupational History  . Not on file  Social Needs  . Financial resource strain: Not on file  . Food insecurity:    Worry: Not on file    Inability: Not on file  . Transportation needs:    Medical: Not on file    Non-medical: Not on file  Tobacco Use  . Smoking status: Never Smoker  . Smokeless tobacco: Never Used  Substance and Sexual Activity  . Alcohol use: Yes    Comment: rare  . Drug use: No  . Sexual activity: Not on file  Lifestyle  . Physical activity:    Days per week: Not on file    Minutes per session: Not on file  . Stress: Not on file  Relationships  . Social connections:    Talks on phone: Not on file    Gets together: Not on file    Attends religious service: Not on file    Active member of club or organization: Not on file    Attends meetings of clubs or organizations: Not on file    Relationship status: Not on file  . Intimate partner violence:    Fear of current or ex partner: Not on file    Emotionally abused:  Not on file    Physically abused: Not on file    Forced sexual activity: Not on file  Other Topics Concern  . Not on file  Social History Narrative  . Not on file    Current Outpatient Medications on File Prior to Visit  Medication Sig Dispense Refill  . acetaminophen (TYLENOL ARTHRITIS PAIN) 650 MG CR tablet Take 1,300 mg by mouth at bedtime as needed for pain.    . DULoxetine (CYMBALTA) 20 MG capsule Take 40 mg by mouth at bedtime.     . Multiple Vitamin (MULTIVITAMIN WITH MINERALS) TABS tablet Take 1 tablet by mouth every morning.     . naproxen (NAPROSYN) 250 MG tablet Take by mouth as needed.    Marland Kitchen omeprazole (PRILOSEC) 20 MG capsule Take 20 mg by mouth 2 (two) times daily.     . ONE TOUCH ULTRA TEST test strip Use to to check blood sugar 2 times per day. 200 each 3  . ONETOUCH DELICA LANCETS 33G MISC 1 Package by Does not apply route 2 (two) times daily.  Use to check blood sugars twice daily E11.65 200 each 11  . trospium (SANCTURA) 20 MG tablet Take 20 mg by mouth 2 (two) times daily.  3  . valsartan-hydrochlorothiazide (DIOVAN-HCT) 320-25 MG per tablet Take 1 tablet by mouth every morning.     . Calcium-Vitamin D (CALTRATE 600 PLUS-VIT D PO) Take 1 tablet by mouth every morning.     . Cholecalciferol (VITAMIN D) 2000 UNITS CAPS Take 2,000 Units by mouth every morning.     . Coenzyme Q10 (COQ10) 200 MG CAPS Take 200 mg by mouth at bedtime.     . [facility] DIGESTIVE PROBIOTIC 250 MG capsule Take by mouth daily.  0  . cyclobenzaprine (FLEXERIL) 10 MG tablet Take 1 tablet (10 mg total) by mouth 2 (two) times daily as needed for muscle spasms. (Patient not taking: Reported on 07/16/2018) 20 tablet 0  . diclofenac sodium (VOLTAREN) 1 % GEL Apply 1 application topically at bedtime as needed (knee pain.). Applied knees    . folic acid (FOLVITE) 800 MCG tablet Take 800 mcg by mouth at bedtime.     . Omega-3 Fatty Acids (RA FISH OIL) 1400 MG CPDR Take 1,400 mg by mouth 2 (two) times daily.    . rosuvastatin (CRESTOR) 10 MG tablet Take 10 mg by mouth every morning.      No current facility-administered medications on file prior to visit.     Allergies  Allergen Reactions  . Colchicine     Stomach cramps  . Diclofenac     Oral causes stomach cramps  . Janumet [Sitagliptin-Metformin Hcl] Nausea And Vomiting    Lightheaded   . Septra [Sulfamethoxazole-Trimethoprim] Itching  . Trulicity [Dulaglutide] Diarrhea and Nausea And Vomiting  . Zetia [Ezetimibe]     Muscle pain  . Imipramine Rash  . Tape Rash    Paper tape ok to use     Family History  Problem Relation Age of Onset  . Diabetes Maternal Grandmother   . Diabetes Maternal Grandfather     BP (!) 142/92 (BP Location: Left Arm, Patient Position: Sitting, Cuff Size: Normal)   Pulse 77   Ht 5\' 3"  (1.6 m)   Wt 220 lb 12.8 oz (100.2 kg)   SpO2 93%   BMI 39.11 kg/m   Review of Systems She  denies hypoglycemia    Objective:   Physical Exam VITAL SIGNS:  See vs page GENERAL: no  distress Pulses: dorsalis pedis intact bilat.   MSK: no deformity of the feet CV: 1+ bilat leg edema Skin:  no ulcer on the feet.  normal color and temp on the feet. Neuro: sensation is intact to touch on the feet Ext: There is bilateral onychomycosis of the toenails.    Lab Results  Component Value Date   CREATININE 1.27 (H) 05/16/2015   BUN 21 (H) 05/16/2015   NA 136 05/16/2015   K 5.1 05/16/2015   CL 103 05/16/2015   CO2 27 05/16/2015   Lab Results  Component Value Date   HGBA1C 8.5 (A) 07/16/2018       Assessment & Plan:  Insulin-requiring type 2 DM, with renal insuff: worse Edema: prob due to d/c jardiance.  She declines to resume.    Patient Instructions  Please increase the insulin to 40 units each morning.  You can stay off the bromocriptine.   Please call or message [place] in a few days, to tell us how the blood sugar is doing.   You should minimize the naproxen, as it is not good for your kidneys.   check your blood sugar once a day.  vary the time of day when you check, between before the 3 meals, and at bedtime.  also check if you have symptoms of your blood sugar being too high or too low.  please keep a record of the readings and bring it to your next appointment here (or you can bring the meter itself).  You can write it on any piece of paper.  please call us sooner if your blood sugar goes below 70, or if you have a lot of readings over 200--especially after a steroid shot.   Please come back for a follow-up appointment in 2 months.

## 2018-07-16 NOTE — Patient Instructions (Addendum)
Please increase the insulin to 40 units each morning.  You can stay off the bromocriptine.   Please call or message us in a few days, to tell us how the blood sugar is doing.   You should minimize the naproxen, as it is not good for your kidneys.   check your blood sugar once a day.  vary the time of day when you check, between before the 3 meals, and at bedtime.  also check if you have symptoms of your blood sugar being too high or too low.  please keep a record of the readings and bring it to your next appointment here (or you can bring the meter itself).  You can write it on any piece of paper.  please call us sooner if your blood sugar goes below 70, or if you have a lot of readings over 200--especially after a steroid shot.   Please come back for a follow-up appointment in 2 months.

## 2018-07-19 ENCOUNTER — Telehealth: Payer: Self-pay | Admitting: Endocrinology

## 2018-07-19 NOTE — Telephone Encounter (Signed)
Called pt to make aware. LVM requesting returned call. 

## 2018-07-19 NOTE — Telephone Encounter (Signed)
Patient stated she is having oral surgery for an Implant. They patient wanted to make sure her sugars where okay to do this. And if there is anything special she needs to do to prepare for this and after.   Please advise

## 2018-07-19 NOTE — Telephone Encounter (Signed)
Please advise 

## 2018-07-19 NOTE — Telephone Encounter (Signed)
Pt returned call. Informed of Dr. Ellison's orders. Verbalized acceptance and understanding. 

## 2018-07-19 NOTE — Telephone Encounter (Signed)
On the day of surgery, take just 20 units.

## 2018-07-20 ENCOUNTER — Telehealth: Payer: Self-pay | Admitting: Endocrinology

## 2018-07-20 ENCOUNTER — Other Ambulatory Visit: Payer: Self-pay

## 2018-07-20 MED ORDER — INSULIN PEN NEEDLE 30G X 5 MM MISC: 3 refills | Status: DC

## 2018-07-20 NOTE — Telephone Encounter (Signed)
This has been sent

## 2018-07-20 NOTE — Telephone Encounter (Signed)
Patient states pharmacy requested for us to send pen needle RX. Please Advise, thanks

## 2018-07-23 ENCOUNTER — Ambulatory Visit
Admission: RE | Admit: 2018-07-23 | Discharge: 2018-07-23 | Disposition: A | Discharge status: Home / Self Care | Payer: Medicare Other | Source: Ambulatory Visit | Attending: Obstetrics and Gynecology | Admitting: Obstetrics and Gynecology

## 2018-07-23 DIAGNOSIS — Z1231 Encounter for screening mammogram for malignant neoplasm of breast: Secondary | ICD-10-CM

## 2018-08-08 ENCOUNTER — Telehealth: Payer: Self-pay | Admitting: Endocrinology

## 2018-08-08 NOTE — Telephone Encounter (Signed)
Patient is calling to provide BS readings. Please Advise, thanks

## 2018-08-08 NOTE — Telephone Encounter (Signed)
Called pt to make her aware of new orders. Verbalized acceptance and understanding.

## 2018-08-08 NOTE — Telephone Encounter (Signed)
Returned pt call. Provided the following CBG readings:  08/08/18 - 260 @ 852am 08/07/18 - 376 @ 1132pm and 246 @ lunch 08/06/18 - 280 @ 930am  Further added she is scheduled for surgery tomorrow. Last advice you offered was "On the day of surgery, take just 20 units". Currently taking 40 units Lantus Solostar qAM. Denies any fevers or s/sx infection. Please advise.

## 2018-08-08 NOTE — Telephone Encounter (Signed)
Please take an extra 15 units of lantus now.  Then, please still take just 20 units tomorrow am

## 2018-08-09 ENCOUNTER — Telehealth: Payer: Self-pay | Admitting: Endocrinology

## 2018-08-09 NOTE — Telephone Encounter (Signed)
Patient just had dental surgery done and would like a call back so she can give her new blood sugar readings to Dr Everardo All   Please advise

## 2018-08-09 NOTE — Telephone Encounter (Signed)
Called pt and informed of Dr. Ellison's orders. Verbalized acceptance and understanding. 

## 2018-08-09 NOTE — Telephone Encounter (Signed)
Returned pt call. LVM requesting returned call. 

## 2018-08-09 NOTE — Telephone Encounter (Signed)
Please take 10 units now Call tomorrow to report cbg's

## 2018-08-09 NOTE — Telephone Encounter (Signed)
Pt called to report she is home from surgery. Anesthesiologist stated CBG prior to d/c was 260. Advised pt, once anes wears off, CBG may raise. Took CBG at 423pm. Results were 206. Please advise.

## 2018-08-10 NOTE — Telephone Encounter (Signed)
Pt informed of dosage increase and stated an understanding

## 2018-08-10 NOTE — Telephone Encounter (Signed)
Pt called with updated CBG reading. States this morning CBG result was 199. Advised I will forward to Dr. Everardo All, continue dosage as prescribed until further notice. Also advised to continue to check CBG's BID and call on Monday with results. Verbalized acceptance and understanding.

## 2018-08-10 NOTE — Telephone Encounter (Signed)
Please take an extra 10 units now. Then, starting tomorrow am, please increase the insulin to 50 units each morning.  Please call or message [place] next week, to tell us how the blood sugar is doing.

## 2018-08-17 ENCOUNTER — Telehealth: Payer: Self-pay | Admitting: Endocrinology

## 2018-08-17 NOTE — Telephone Encounter (Signed)
Recent blood sugar readings 1/11: 183 am 178 pm  1/12: 258 am 226 pm  1/13: 203 am  1/14: 280 pm  1/15: 241 am  1/16:216 am

## 2018-08-17 NOTE — Telephone Encounter (Signed)
Patient stated that she would like a call back to give her lastest blood sugar readings.

## 2018-08-17 NOTE — Telephone Encounter (Signed)
Please take an extra 10 units now.  Tomorrow, please increase the insulin to 60 units each morning

## 2018-08-20 NOTE — Telephone Encounter (Signed)
Patient given MD instructions and she verbalized an understanding

## 2018-08-22 ENCOUNTER — Ambulatory Visit: Payer: Medicare Other | Admitting: Endocrinology

## 2018-08-28 ENCOUNTER — Telehealth: Payer: Self-pay | Admitting: Endocrinology

## 2018-08-28 NOTE — Telephone Encounter (Signed)
Are you taking any type of steroids? no 1. Are you sick or becoming sick? Surgery 3 weeks ago jaw bone  2. Is this the first elevated blood sugar reading? no  3. Have you tried to correct it with insulin? yes  4. Have you been taking/taken today all of the prescribed medications? yes  Date Time Reading Notes       1/22 8:45 142   1/22 11:35 250   1/23 9:09 190   1/23 1:10 171   01/24 12:08 218   01/26 7:30 201   01/26 6:25 254   01/27 6:00 275     Please advise

## 2018-08-28 NOTE — Telephone Encounter (Signed)
please increase the insulin to70 units each morning

## 2018-08-28 NOTE — Telephone Encounter (Signed)
Patient would like a call back so she is able to give updated blood sugar readings.

## 2018-08-28 NOTE — Telephone Encounter (Signed)
Gave patient MD advice on insulin increase and she stated she will do this-patient had no further questions

## 2018-08-28 NOTE — Telephone Encounter (Signed)
pleaseincrease the insulin to80 units each morning

## 2018-08-28 NOTE — Telephone Encounter (Signed)
Pt is already taking 70 units at this time. Please advise re: new dosage

## 2018-09-03 ENCOUNTER — Other Ambulatory Visit: Payer: Self-pay

## 2018-09-03 ENCOUNTER — Telehealth: Payer: Self-pay | Admitting: Endocrinology

## 2018-09-03 DIAGNOSIS — E1122 Type 2 diabetes mellitus with diabetic chronic kidney disease: Secondary | ICD-10-CM

## 2018-09-03 DIAGNOSIS — N183 Chronic kidney disease, stage 3 (moderate): Principal | ICD-10-CM

## 2018-09-03 MED ORDER — INSULIN PEN NEEDLE 30G X 5 MM MISC: 3 refills | Status: DC

## 2018-09-03 MED ORDER — INSULIN GLARGINE 100 UNIT/ML SOLOSTAR PEN: 90.0000 [IU] | PEN_INJECTOR | SUBCUTANEOUS | 99 refills | Status: DC

## 2018-09-03 NOTE — Telephone Encounter (Signed)
pleaseincrease the insulin to90 units each morning

## 2018-09-03 NOTE — Telephone Encounter (Signed)
Called pt and made her aware of N/O's. Verbalized acceptance and understanding.

## 2018-09-03 NOTE — Telephone Encounter (Signed)
Patient called stating "she has readings to provide to Dr Everardo All"  08/30/2018 @ 904 AM reading 205 08/30/2018 @ 1148 PM reading 225 08/31/2018 @ 136 PM reading 197  08/31/2018 @ 1053 PM reading 207 09/01/2018 @ 1000 AM reading 143 09/01/2018 @ 716PM reading 165 09/02/2018 @ 756 AM reading 216 09/03/2018 @ 821 AM reading 154  Per patient "taking 80 units today instead 40 units as prescirbed.  New prescription needs to be called in to reflect adjusted dosages"  Uses [facility] in [place] [address] of [address]

## 2018-09-03 NOTE — Telephone Encounter (Signed)
Please review readings and advise if any changes are required.

## 2018-09-04 ENCOUNTER — Telehealth: Payer: Self-pay | Admitting: Endocrinology

## 2018-09-04 NOTE — Telephone Encounter (Signed)
Returned call. Informed of change from Basaglar to [employer] Solostar d/t insurance coverage. Verbalized acceptance and understanding.

## 2018-09-04 NOTE — Telephone Encounter (Signed)
Patient states she is returning our call. Patient can be reached at ph# [phone].

## 2018-09-10 ENCOUNTER — Telehealth: Payer: Self-pay | Admitting: Endocrinology

## 2018-09-10 NOTE — Telephone Encounter (Signed)
Patient is requesting a call back to give updated sugar readings.

## 2018-09-10 NOTE — Telephone Encounter (Signed)
Pt stated her blood sugars have been as follows: 90 units lantus.   0205/2020 @ 2:22pm-163  02/052020 @ 11:42pm- 292 09/06/2018 @ 6:26pm 207 09/07/2018 @ 9:53am-199 09/07/2018 @ 10:48pm- 186 09/08/2018 @ 10:19am-225 09/09/2018 @ 7:49am- 216 09/10/2018 @ 8:38am-207

## 2018-09-10 NOTE — Telephone Encounter (Signed)
pleaseincrease the insulin to100 units each morning

## 2018-09-11 NOTE — Telephone Encounter (Signed)
Called pt and made her aware of new orders. Verbalized acceptance and understanding. 

## 2018-09-19 ENCOUNTER — Encounter: Payer: Self-pay | Admitting: Endocrinology

## 2018-09-19 ENCOUNTER — Other Ambulatory Visit: Payer: Self-pay

## 2018-09-19 ENCOUNTER — Ambulatory Visit: Payer: Medicare Other | Admitting: Endocrinology

## 2018-09-19 VITALS — BP 132/80 | HR 98 | Ht 63.0 in | Wt 216.4 lb

## 2018-09-19 DIAGNOSIS — E1122 Type 2 diabetes mellitus with diabetic chronic kidney disease: Secondary | ICD-10-CM

## 2018-09-19 DIAGNOSIS — N183 Chronic kidney disease, stage 3 unspecified: Secondary | ICD-10-CM

## 2018-09-19 LAB — POCT GLYCOSYLATED HEMOGLOBIN (HGB A1C): Hemoglobin A1C: 9 % — AB (ref 4.0–5.6)

## 2018-09-19 MED ORDER — INSULIN GLARGINE 100 UNIT/ML SOLOSTAR PEN: 100.0000 [IU] | PEN_INJECTOR | SUBCUTANEOUS | 11 refills | Status: DC

## 2018-09-19 MED ORDER — FREESTYLE LIBRE 14 DAY READER DEVI: 1.0000 | Freq: Once | 0 refills | Status: AC

## 2018-09-19 MED ORDER — FREESTYLE LIBRE 14 DAY SENSOR MISC: 1.0000 | 3 refills | Status: DC

## 2018-09-19 NOTE — Progress Notes (Signed)
Subjective:    Patient ID: Pamela LittenLynda Dickenman Love, female    DOB: 03/13/1951, 68 y.o.   MRN: [id number]  HPI Pt returns for f/u of diabetes mellitus: DM type: Insulin-requiring type 2 Dx'ed: 2012 Complications: renal insufficiency.  Therapy: insulin since 2019 GDM: never.  DKA: never.  Severe hypoglycemia: never.  Pancreatitis: never.  Other: she also took insulin for 6 months, in 2016; renal insuff and edema limit rx options; she did not tolerate trulicity (nausea), or repaglinide (diarrhea); she declines multiple daily injections  Interval history: no recent steroids. she brings a record of her cbg's which I have reviewed today.  It varies from 169-275.  She has some insulin intermittently leaking out of the injection sites. Past Medical History:  Diagnosis Date  . Arthritis   . Bursitis    hips  . Complication of anesthesia    "spinal did not work with second c section"  . Diabetes mellitus without complication (HCC)    diet controlled - has Rx for Glymipride but has not started taking yet  . GERD (gastroesophageal reflux disease)   . History of nonmelanoma skin cancer   . History of skin cancer   . Hyperlipidemia   . Hypertension   . OAB (overactive bladder)   . Osteoporosis     Past Surgical History:  Procedure Laterality Date  . ABDOMINAL HYSTERECTOMY  2005  . CESAREAN SECTION     x2  . colonscopy     . EYE SURGERY     bilat cataract surgery 2015  . TOTAL KNEE ARTHROPLASTY Left 12/18/2014   Procedure: TOTAL LEFT KNEE ARTHROPLASTY;  Surgeon: Eugenia Mcalpineobert Collins, MD;  Location: [facility];  Service: Orthopedics;  Laterality: Left;  . TOTAL KNEE ARTHROPLASTY Right 05/15/2015   Procedure: RIGHT TOTAL KNEE ARTHROPLASTY;  Surgeon: Eugenia Mcalpineobert Collins, MD;  Location: [facility];  Service: Orthopedics;  Laterality: Right;  . UTERINE FIBROID SURGERY      Social History   Socioeconomic History  . Marital status: Married    Spouse name: Not on file  . Number of children: Not on file  .  Years of education: Not on file  . Highest education level: Not on file  Occupational History  . Not on file  Social Needs  . Financial resource strain: Not on file  . Food insecurity:    Worry: Not on file    Inability: Not on file  . Transportation needs:    Medical: Not on file    Non-medical: Not on file  Tobacco Use  . Smoking status: Never Smoker  . Smokeless tobacco: Never Used  Substance and Sexual Activity  . Alcohol use: Yes    Comment: rare  . Drug use: No  . Sexual activity: Not on file  Lifestyle  . Physical activity:    Days per week: Not on file    Minutes per session: Not on file  . Stress: Not on file  Relationships  . Social connections:    Talks on phone: Not on file    Gets together: Not on file    Attends religious service: Not on file    Active member of club or organization: Not on file    Attends meetings of clubs or organizations: Not on file    Relationship status: Not on file  . Intimate partner violence:    Fear of current or ex partner: Not on file    Emotionally abused: Not on file    Physically abused: Not on file  Forced sexual activity: Not on file  Other Topics Concern  . Not on file  Social History Narrative  . Not on file    Current Outpatient Medications on File Prior to Visit  Medication Sig Dispense Refill  . acetaminophen (TYLENOL ARTHRITIS PAIN) 650 MG CR tablet Take 1,300 mg by mouth at bedtime as needed for pain.    . Calcium-Vitamin D (CALTRATE 600 PLUS-VIT D PO) Take 1 tablet by mouth every morning.     . Cholecalciferol (VITAMIN D) 2000 UNITS CAPS Take 2,000 Units by mouth every morning.     . Coenzyme Q10 (COQ10) 200 MG CAPS Take 200 mg by mouth at bedtime.     . [facility] DIGESTIVE PROBIOTIC 250 MG capsule Take by mouth daily.  0  . cyclobenzaprine (FLEXERIL) 10 MG tablet Take 1 tablet (10 mg total) by mouth 2 (two) times daily as needed for muscle spasms. 20 tablet 0  . diclofenac sodium (VOLTAREN) 1 % GEL Apply 1  application topically at bedtime as needed (knee pain.). Applied knees    . DULoxetine (CYMBALTA) 20 MG capsule Take 40 mg by mouth at bedtime.     . folic acid (FOLVITE) 800 MCG tablet Take 800 mcg by mouth at bedtime.     . Insulin Pen Needle 30G X 5 MM MISC Use 1 daily to inject Lantus 100 each 3  . Multiple Vitamin (MULTIVITAMIN WITH MINERALS) TABS tablet Take 1 tablet by mouth every morning.     . Omega-3 Fatty Acids (RA FISH OIL) 1400 MG CPDR Take 1,400 mg by mouth 2 (two) times daily.    Marland Kitchen omeprazole (PRILOSEC) 20 MG capsule Take 20 mg by mouth 2 (two) times daily.     . ONE TOUCH ULTRA TEST test strip Use to to check blood sugar 2 times per day. 200 each 3  . ONETOUCH DELICA LANCETS 33G MISC 1 Package by Does not apply route 2 (two) times daily. Use to check blood sugars twice daily E11.65 200 each 11  . rosuvastatin (CRESTOR) 10 MG tablet Take 10 mg by mouth every morning.     . trospium (SANCTURA) 20 MG tablet Take 20 mg by mouth 2 (two) times daily.  3  . valsartan-hydrochlorothiazide (DIOVAN-HCT) 320-25 MG per tablet Take 1 tablet by mouth every morning.      No current facility-administered medications on file prior to visit.     Allergies  Allergen Reactions  . Colchicine     Stomach cramps  . Diclofenac     Oral causes stomach cramps  . Janumet [Sitagliptin-Metformin Hcl] Nausea And Vomiting    Lightheaded   . Septra [Sulfamethoxazole-Trimethoprim] Itching  . Trulicity [Dulaglutide] Diarrhea and Nausea And Vomiting  . Zetia [Ezetimibe]     Muscle pain  . Imipramine Rash  . Tape Rash    Paper tape ok to use     Family History  Problem Relation Age of Onset  . Diabetes Maternal Grandmother   . Diabetes Maternal Grandfather     BP 132/80 (BP Location: Left Arm, Patient Position: Sitting, Cuff Size: Large)   Pulse 98   Ht 5\' 3"  (1.6 m)   Wt 216 lb 6.4 oz (98.2 kg)   SpO2 97%   BMI 38.33 kg/m    Review of Systems She has lost a few lbs.  She denies  hypoglycemia.     Objective:   Physical Exam VITAL SIGNS:  See vs page GENERAL: no distress Pulses: dorsalis pedis intact bilat.  MSK: no deformity of the feet CV: no leg edema Skin:  no ulcer on the feet.  normal color and temp on the feet.   Neuro: sensation is intact to touch on the feet.     Lab Results  Component Value Date   HGBA1C 9.0 (A) 09/19/2018       Assessment & Plan:  Insulin-requiring type 2 DM, with renal insuff: worse Insulin leakage: I am not sure how much this contributes to a1c.   Patient Instructions  Please continue the same insulin for now.  Please make sure it does not leak out, by withdrawing the needle slowly. Please call or message [place] next week, to tell us how the blood sugar is doing.  check your blood sugar once a day.  vary the time of day when you check, between before the 3 meals, and at bedtime.  also check if you have symptoms of your blood sugar being too high or too low.  please keep a record of the readings and bring it to your next appointment here (or you can bring the meter itself).  You can write it on any piece of paper.  please call us sooner if your blood sugar goes below 70, or if you have a lot of readings over 200--especially after a steroid shot.  I have sent a prescription to your pharmacy, for the continuous glucose monitor.    Please come back for a follow-up appointment in 2 months.

## 2018-09-19 NOTE — Patient Instructions (Addendum)
Please continue the same insulin for now.  Please make sure it does not leak out, by withdrawing the needle slowly. Please call or message [place] next week, to tell us how the blood sugar is doing.  check your blood sugar once a day.  vary the time of day when you check, between before the 3 meals, and at bedtime.  also check if you have symptoms of your blood sugar being too high or too low.  please keep a record of the readings and bring it to your next appointment here (or you can bring the meter itself).  You can write it on any piece of paper.  please call us sooner if your blood sugar goes below 70, or if you have a lot of readings over 200--especially after a steroid shot.  I have sent a prescription to your pharmacy, for the continuous glucose monitor.    Please come back for a follow-up appointment in 2 months.

## 2018-09-28 ENCOUNTER — Telehealth: Payer: Self-pay

## 2018-09-28 NOTE — Telephone Encounter (Signed)
Patient calling to speak to the nurse about her blood sugar readings- patient is traveling today and would like a call back on her cell phone at   [phone]

## 2018-09-28 NOTE — Telephone Encounter (Signed)
Called pt and informed of new orders. Verbalized acceptance and understanding. 

## 2018-09-28 NOTE — Telephone Encounter (Signed)
1. Are you taking any type of steroids? No  2. Are you sick or becoming sick? No  3. Is this the first elevated blood sugar reading? No  4. Have you tried to correct it with insulin? No  5. Have you been taking/taken today all of the prescribed medications? Yes  6. What is your current diabetic insulin or oral medication dosage? Lantus 100 units daily   Date Time Reading Notes       09/28/18 821am 204 Moving her son's things into his new place  09/27/18 722am 168 N/A  09/26/18 724am 121 N/A  09/25/18 738am 196 N/A                        Please advise

## 2018-09-28 NOTE — Telephone Encounter (Signed)
Ok, please increase insulin to 110 units qam.

## 2018-10-04 ENCOUNTER — Telehealth: Payer: Self-pay | Admitting: Endocrinology

## 2018-10-04 ENCOUNTER — Telehealth: Payer: Self-pay

## 2018-10-04 ENCOUNTER — Other Ambulatory Visit: Payer: Self-pay

## 2018-10-04 DIAGNOSIS — E1122 Type 2 diabetes mellitus with diabetic chronic kidney disease: Secondary | ICD-10-CM

## 2018-10-04 DIAGNOSIS — N183 Chronic kidney disease, stage 3 unspecified: Secondary | ICD-10-CM

## 2018-10-04 MED ORDER — INSULIN GLARGINE 100 UNIT/ML SOLOSTAR PEN: 120.0000 [IU] | PEN_INJECTOR | SUBCUTANEOUS | 11 refills | Status: DC

## 2018-10-04 NOTE — Telephone Encounter (Signed)
Ok, please increase insulin to 120 units qam.

## 2018-10-04 NOTE — Telephone Encounter (Signed)
Closed as duplicate 

## 2018-10-04 NOTE — Telephone Encounter (Signed)
Insulin Glargine (LANTUS SOLOSTAR) 100 UNIT/ML Solostar Pen 2 pen 11 10/04/2018    Sig - Route: Inject 120 Units into the skin every morning. Please provide pt with a 30 day supply - Subcutaneous   Sent to pharmacy as: Insulin Glargine (LANTUS SOLOSTAR) 100 UNIT/ML Solostar Pen   E-Prescribing Status: Receipt confirmed by pharmacy (10/04/2018 4:35 PM EST)    Called pt and informed of new orders. New Rx sent as requested

## 2018-10-04 NOTE — Telephone Encounter (Signed)
Patient returning Ammie's call she does not know the reason for the call

## 2018-10-04 NOTE — Telephone Encounter (Signed)
Patient has called "states that she has blood sugar readings so that her medications can be adjusted accordingly and she also problem with her Surveyor, quantity Pen (pharmacy issues)"

## 2018-10-04 NOTE — Telephone Encounter (Signed)
1. Are you taking any type of steroids? No  2. Are you sick or becoming sick? No  3. Is this the first elevated blood sugar reading? No  4. Have you tried to correct it with insulin? Yes  5. Have you been taking/taken today all of the prescribed medications? Yes  6. What is your current diabetic insulin or oral medication dosage? Lantus 110units   Date Time Reading Notes       09/30/18 902a 120 N/A  09/30/18 851p 191 N/A  10/01/18 200p 182 N/A  10/02/18 828a 214 N/A  10/03/18 skipped skipped skipped  10/04/18 201p 138 N/A              Please advise  Pt is requesting 30 day supply of insulin to reduce copay cost. Advised to call pharmacy to inquire total # of pens required to be Rx'd to ensure her copay is only $40.00. States she will call to inquire further.

## 2018-10-11 ENCOUNTER — Encounter: Payer: Medicare Other | Attending: Endocrinology | Admitting: Dietician

## 2018-10-11 ENCOUNTER — Encounter: Payer: Self-pay | Admitting: Dietician

## 2018-10-11 ENCOUNTER — Other Ambulatory Visit: Payer: Self-pay

## 2018-10-11 ENCOUNTER — Telehealth: Payer: Self-pay | Admitting: Endocrinology

## 2018-10-11 DIAGNOSIS — E1122 Type 2 diabetes mellitus with diabetic chronic kidney disease: Secondary | ICD-10-CM | POA: Diagnosis present

## 2018-10-11 DIAGNOSIS — N183 Chronic kidney disease, stage 3 (moderate): Secondary | ICD-10-CM | POA: Insufficient documentation

## 2018-10-11 NOTE — Telephone Encounter (Signed)
1. Have you been taking/taken today all of the prescribed medications? Yes   Date Time Reading Notes       3/12 839a 193 N/A  3/11 857a 136 N/A  3/10 137a 176 N/A  3/9 720a 190 N/A  3/8 116p 208 N/A  3/8 804a 241 N/A              Please advise

## 2018-10-11 NOTE — Patient Instructions (Addendum)
Find ways to decrease your fat intake (mayo). Find ways to incorporate more non starchy vegetables. Rotate insulin injection sites.  Avoid scar tissue or hard spots.  Plan:  Aim for 2-3 Carb Choices per meal (30-45 grams) +/- 1 either way  Aim for 0-1 Carbs per snack if hungry  Include protein in moderation with your meals and snacks Consider reading food labels for Total Carbohydrate of foods Consider  increasing your activity level by walking or dancing or swimming or other for 30 or more minutes daily as tolerated Continue checking BG at alternate times per day  Continue taking medication as directed by MD

## 2018-10-11 NOTE — Progress Notes (Signed)
Diabetes Self-Management Education  Visit Type: First/Initial  Appt. Start Time: 1540 Appt. End Time: 8182  10/12/2018  [name], identified by name and date of birth, is a 68 y.o. female with a diagnosis of Diabetes: Type 2.   ASSESSMENT Patient is here today alone for education for type 2 diabetes with CKD stage 3.  Other history includes GERD, HTN, HLD, osteoporosis.  She has been diagnosed with diabetes since 2012 and has started insulin 07/2018.   She got c.diff last summer and then in October devleloped muscle pain and weakness and has had increased dental problems.   She has had continued high BG despite increases in the Lantus.  She is quite busy during the day and does not want to take meal time insulin for lunch but would consider other injections in the morning and evening if needed.  CBG's remain >180 each am.  Discussed impact of insulin resistance.  Doesn't tolerate Metformin. Medications include:  Lantus 130 units q am.  She takes this in 2 doses, leaves pen needle in for a count of six and still notes that some insulin comes out at times.  She struggles with site rotation and stated that she has increased bruising.  She has been using some needles she had from an insulin prescription several years ago and reports that the new needles are better.    Weight gain started at age 79 after the birth of her second child.  Patient lives with her husband and they share shopping and cooking. Retired from [employer] (mostly 3rd grade). Water aerobics 2 times per week for 1 1/2 hours, dance classes or dancing for 3 hours weekly. Visits grandchildren often and stays very active. Doesn't buy sweets - if they are there she will eat them.  Weight 218 lb (98.9 kg). Body mass index is 38.62 kg/m.  Diabetes Self-Management Education - 10/11/18 1612      Visit Information   Visit Type  First/Initial      Initial Visit   Diabetes Type  Type 2    Are you currently following a  meal plan?  No    Are you taking your medications as prescribed?  Yes    Date Diagnosed  2012   Insulin since 2019     Health Coping   How would you rate your overall health?  Good      Psychosocial Assessment   Patient Belief/Attitude about Diabetes  Other (comment)   embarrassed   Self-care barriers  None    Self-management support  Doctor's office;Family    Other persons present  Patient    Patient Concerns  Nutrition/Meal planning;Glycemic Control;Weight Control;Problem Solving;Medication    Special Needs  None    Preferred Learning Style  No preference indicated    Learning Readiness  Ready    How often do you need to have someone help you when you read instructions, pamphlets, or other written materials from your doctor or pharmacy?  1 - Never    What is the last grade level you completed in school?  6 plus years college      Pre-Education Assessment   Patient understands the diabetes disease and treatment process.  Needs Instruction    Patient understands incorporating nutritional management into lifestyle.  Needs Instruction    Patient undertands incorporating physical activity into lifestyle.  Needs Instruction    Patient understands using medications safely.  Needs Instruction    Patient understands monitoring blood glucose, interpreting and using results  Needs Instruction    Patient understands prevention, detection, and treatment of acute complications.  Needs Instruction    Patient understands prevention, detection, and treatment of chronic complications.  Needs Instruction    Patient understands how to develop strategies to address psychosocial issues.  Needs Instruction    Patient understands how to develop strategies to promote health/change behavior.  Needs Instruction      Complications   Last HgB A1C per patient/outside source  9 %   09/19/2018 increased from 8.5% 07/16/18   How often do you check your blood sugar?  1-2 times/day    Fasting Blood glucose range  (mg/dL)  180-200;>200    Number of hypoglycemic episodes per month  0    Number of hyperglycemic episodes per week  14    Can you tell when your blood sugar is high?  No    Have you had a dilated eye exam in the past 12 months?  Yes    Have you had a dental exam in the past 12 months?  Yes    Are you checking your feet?  Yes    How many days per week are you checking your feet?  6   complains that her feet swell at times     Dietary Intake   Breakfast  Berries, [employer] greek vanilla yogurt   8-9   Snack (morning)  none    Lunch  sandwich (PB & low sugar jelly or lunch meat) on [employer] bread, 1/2 banana, puffed corn   12-1   Snack (afternoon)  occasional spoon of PB or square of cheese     Dinner  meat (chicken or kabob), vegetables, edemame or 1/2 baked potato or mac and cheese, and smart carb ice cream   7-9   Snack (evening)  none    Beverage(s)  water, strawberry lemonade (sugar free)      Exercise   Exercise Type  Light (walking / raking leaves);Moderate (swimming / aerobic walking)   water aerobics, dancing, walking   How many days per week to you exercise?  4    How many minutes per day do you exercise?  90    Total minutes per week of exercise  360      Patient Education   Previous Diabetes Education  No    Disease state   Definition of diabetes, type 1 and 2, and the diagnosis of diabetes    Nutrition management   Role of diet in the treatment of diabetes and the relationship between the three main macronutrients and blood glucose level;Food label reading, portion sizes and measuring food.;Meal options for control of blood glucose level and chronic complications.;Information on hints to eating out and maintain blood glucose control.;Meal timing in regards to the patients' current diabetes medication.    Physical activity and exercise   Role of exercise on diabetes management, blood pressure control and cardiac health.    Medications  Taught/reviewed insulin injection, site  rotation, insulin storage and needle disposal.;Reviewed patients medication for diabetes, action, purpose, timing of dose and side effects.    Monitoring  Purpose and frequency of SMBG.;Identified appropriate SMBG and/or A1C goals.;Daily foot exams;Yearly dilated eye exam    Acute complications  Taught treatment of hypoglycemia - the 15 rule.;Discussed and identified patients' treatment of hyperglycemia.    Chronic complications  Relationship between chronic complications and blood glucose control    Psychosocial adjustment  Role of stress on diabetes;Worked with patient to identify barriers to care and  solutions      Individualized Goals (developed by patient)   Nutrition  General guidelines for healthy choices and portions discussed    Physical Activity  Exercise 5-7 days per week;30 minutes per day    Monitoring   test my blood glucose as discussed    Reducing Risk  examine blood glucose patterns    Health Coping  discuss diabetes with (comment)   MD, RD, CDE     Post-Education Assessment   Patient understands the diabetes disease and treatment process.  Demonstrates understanding / competency    Patient understands incorporating nutritional management into lifestyle.  Demonstrates understanding / competency    Patient undertands incorporating physical activity into lifestyle.  Demonstrates understanding / competency    Patient understands using medications safely.  Demonstrates understanding / competency    Patient understands monitoring blood glucose, interpreting and using results  Demonstrates understanding / competency    Patient understands prevention, detection, and treatment of acute complications.  Demonstrates understanding / competency    Patient understands prevention, detection, and treatment of chronic complications.  Demonstrates understanding / competency    Patient understands how to develop strategies to address psychosocial issues.  Demonstrates understanding / competency     Patient understands how to develop strategies to promote health/change behavior.  Demonstrates understanding / competency      Outcomes   Expected Outcomes  Demonstrated interest in learning. Expect positive outcomes    Future DMSE  PRN    Program Status  Completed       Individualized Plan for Diabetes Self-Management Training:   Learning Objective:  Patient will have a greater understanding of diabetes self-management. Patient education plan is to attend individual and/or group sessions per assessed needs and concerns.   Plan:   Patient Instructions  Find ways to decrease your fat intake (mayo). Find ways to incorporate more non starchy vegetables. Rotate insulin injection sites.  Avoid scar tissue or hard spots.  Plan:  Aim for 2-3 Carb Choices per meal (30-45 grams) +/- 1 either way  Aim for 0-1 Carbs per snack if hungry  Include protein in moderation with your meals and snacks Consider reading food labels for Total Carbohydrate of foods Consider  increasing your activity level by walking or dancing or swimming or other for 30 or more minutes daily as tolerated Continue checking BG at alternate times per day  Continue taking medication as directed by MD      Expected Outcomes:  Demonstrated interest in learning. Expect positive outcomes  Education material provided: ADA Diabetes: Your Take Control Guide, Meal plan card and Snack sheet, Diabetes Resource page  If problems or questions, patient to contact team via:  Phone  Future DSME appointment: PRN

## 2018-10-11 NOTE — Telephone Encounter (Signed)
Called pt and informed of new orders. Verbalized acceptance and understanding. 

## 2018-10-11 NOTE — Telephone Encounter (Signed)
Ok, please increase insulin to 130 units qam. Please call or message [place] next week, to tell us how the blood sugar is doing

## 2018-10-11 NOTE — Telephone Encounter (Signed)
Patient would like a call back to give Dr Everardo All her most recent blood sugar readings.

## 2018-10-23 ENCOUNTER — Telehealth: Payer: Self-pay | Admitting: Endocrinology

## 2018-10-23 NOTE — Telephone Encounter (Signed)
Please continue the same insulin I'll see you next time.   

## 2018-10-23 NOTE — Telephone Encounter (Signed)
Pt called regarding about the readings at home  10/22/18--104--2:26pm 10/21/18---143---9:34am 10/20/18--147--7:30pm 10/18/18--144--8:46am 10/17/18--158--7:45am

## 2018-10-24 NOTE — Telephone Encounter (Signed)
Notified pt to continue same insulin. Pt understood without questions.

## 2018-11-21 ENCOUNTER — Ambulatory Visit: Payer: Medicare Other | Admitting: Endocrinology

## 2018-12-20 ENCOUNTER — Telehealth: Payer: Self-pay | Admitting: Endocrinology

## 2018-12-20 ENCOUNTER — Other Ambulatory Visit: Payer: Self-pay

## 2018-12-20 DIAGNOSIS — E1122 Type 2 diabetes mellitus with diabetic chronic kidney disease: Secondary | ICD-10-CM

## 2018-12-20 MED ORDER — INSULIN PEN NEEDLE 30G X 5 MM MISC: 3 refills | Status: DC

## 2018-12-20 NOTE — Telephone Encounter (Signed)
Insulin Pen Needle 30G X 5 MM MISC 100 each 3 12/20/2018    Sig: Use to inject insulin TID; E11.9   Sent to pharmacy as: Insulin Pen Needle 30G X 5 MM Misc   E-Prescribing Status: Receipt confirmed by pharmacy (12/20/2018 9:59 AM EDT)    Pt requested to convert 01/03/19 appt to [facility]. Changes made as requested. Also states she will be having labs obtained by Dr. Clelia Croft. Will ask that they fax results to our office.

## 2018-12-20 NOTE — Telephone Encounter (Signed)
Patient requests for Dr. Everardo All to adjust dosage of BD Ultra Fine Mini Pen needles 5 mm -30 or 31G. Patient testing 2-3 x per day (current RX is for only testing 1 x per day).  Patient also requests Nurse call her at ph# [phone]. Pharmacy is : [id number] - [place], [place] - [address] [phone] (Phone) [phone] (Fax)

## 2019-01-03 ENCOUNTER — Ambulatory Visit (INDEPENDENT_AMBULATORY_CARE_PROVIDER_SITE_OTHER): Payer: Medicare Other | Admitting: Endocrinology

## 2019-01-03 ENCOUNTER — Other Ambulatory Visit: Payer: Self-pay

## 2019-01-03 DIAGNOSIS — E1129 Type 2 diabetes mellitus with other diabetic kidney complication: Secondary | ICD-10-CM

## 2019-01-03 DIAGNOSIS — Z794 Long term (current) use of insulin: Secondary | ICD-10-CM | POA: Diagnosis not present

## 2019-01-03 DIAGNOSIS — E1122 Type 2 diabetes mellitus with diabetic chronic kidney disease: Secondary | ICD-10-CM

## 2019-01-03 MED ORDER — INSULIN GLARGINE 100 UNIT/ML SOLOSTAR PEN: 130.0000 [IU] | PEN_INJECTOR | SUBCUTANEOUS | 11 refills | Status: DC

## 2019-01-03 NOTE — Progress Notes (Signed)
Subjective:    Patient ID: Pamela Love, female    DOB: 10/03/1950, 68 y.o.   MRN: [id number]  HPI  telehealth visit today via doxy video visit.  Alternatives to telehealth are presented to this patient, and the patient agrees to the telehealth visit. Pt is advised of the cost of the visit, and agrees to this, also.   Patient is at home, and I am at the office.   Persons attending the telehealth visit: the patient and I Pt returns for f/u of diabetes mellitus: DM type: Insulin-requiring type 2 Dx'ed: 2012 Complications: renal insufficiency.  Therapy: insulin since 2019 GDM: never.  DKA: never.  Severe hypoglycemia: never.  Pancreatitis: never.  Other: she also took insulin for 6 months, in 2016; renal insuff and edema limit rx options; she did not tolerate trulicity (nausea), or repaglinide (diarrhea); she declines multiple daily injections  Interval history: She takes 130 units qam.  She says cbg varies from 96-178.  It is in general higher as the day goes on.   Past Medical History:  Diagnosis Date  . Arthritis   . Bursitis    hips  . C. difficile colitis 12/2017  . Complication of anesthesia    "spinal did not work with second c section"  . Diabetes mellitus without complication (HCC)    diet controlled - has Rx for Glymipride but has not started taking yet  . GERD (gastroesophageal reflux disease)   . History of nonmelanoma skin cancer   . History of skin cancer   . Hyperlipidemia   . Hypertension   . OAB (overactive bladder)   . Osteoporosis     Past Surgical History:  Procedure Laterality Date  . ABDOMINAL HYSTERECTOMY  2005  . CESAREAN SECTION     x2  . colonscopy     . EYE SURGERY     bilat cataract surgery 2015  . TOTAL KNEE ARTHROPLASTY Left 12/18/2014   Procedure: TOTAL LEFT KNEE ARTHROPLASTY;  Surgeon: Eugenia Mcalpineobert Collins, MD;  Location: [facility];  Service: Orthopedics;  Laterality: Left;  . TOTAL KNEE ARTHROPLASTY Right 05/15/2015   Procedure: RIGHT  TOTAL KNEE ARTHROPLASTY;  Surgeon: Eugenia Mcalpineobert Collins, MD;  Location: [facility];  Service: Orthopedics;  Laterality: Right;  . UTERINE FIBROID SURGERY      Social History   Socioeconomic History  . Marital status: Married    Spouse name: Not on file  . Number of children: Not on file  . Years of education: Not on file  . Highest education level: Not on file  Occupational History  . Not on file  Social Needs  . Financial resource strain: Not on file  . Food insecurity:    Worry: Not on file    Inability: Not on file  . Transportation needs:    Medical: Not on file    Non-medical: Not on file  Tobacco Use  . Smoking status: Never Smoker  . Smokeless tobacco: Never Used  Substance and Sexual Activity  . Alcohol use: Yes    Comment: rare  . Drug use: No  . Sexual activity: Not on file  Lifestyle  . Physical activity:    Days per week: Not on file    Minutes per session: Not on file  . Stress: Not on file  Relationships  . Social connections:    Talks on phone: Not on file    Gets together: Not on file    Attends religious service: Not on file    Active  member of club or organization: Not on file    Attends meetings of clubs or organizations: Not on file    Relationship status: Not on file  . Intimate partner violence:    Fear of current or ex partner: Not on file    Emotionally abused: Not on file    Physically abused: Not on file    Forced sexual activity: Not on file  Other Topics Concern  . Not on file  Social History Narrative  . Not on file    Current Outpatient Medications on File Prior to Visit  Medication Sig Dispense Refill  . acetaminophen (TYLENOL ARTHRITIS PAIN) 650 MG CR tablet Take 1,300 mg by mouth at bedtime as needed for pain.    . Calcium-Vitamin D (CALTRATE 600 PLUS-VIT D PO) Take 1 tablet by mouth every morning.     . Cholecalciferol (VITAMIN D) 2000 UNITS CAPS Take 2,000 Units by mouth every morning.     . Coenzyme Q10 (COQ10) 200 MG CAPS Take 200  mg by mouth at bedtime.     . Continuous Blood Gluc Sensor (FREESTYLE LIBRE 14 DAY SENSOR) MISC 1 Device by Does not apply route every 14 (fourteen) days. 6 each 3  . [facility] DIGESTIVE PROBIOTIC 250 MG capsule Take by mouth daily.  0  . cyclobenzaprine (FLEXERIL) 10 MG tablet Take 1 tablet (10 mg total) by mouth 2 (two) times daily as needed for muscle spasms. 20 tablet 0  . diclofenac sodium (VOLTAREN) 1 % GEL Apply 1 application topically at bedtime as needed (knee pain.). Applied knees    . DULoxetine (CYMBALTA) 20 MG capsule Take 40 mg by mouth at bedtime.     . folic acid (FOLVITE) 800 MCG tablet Take 800 mcg by mouth at bedtime.     . Insulin Pen Needle 30G X 5 MM MISC Use to inject insulin TID; E11.9 100 each 3  . Multiple Vitamin (MULTIVITAMIN WITH MINERALS) TABS tablet Take 1 tablet by mouth every morning.     . Omega-3 Fatty Acids (RA FISH OIL) 1400 MG CPDR Take 1,400 mg by mouth 2 (two) times daily.    Marland Kitchen omeprazole (PRILOSEC) 20 MG capsule Take 20 mg by mouth 2 (two) times daily.     . ONE TOUCH ULTRA TEST test strip Use to to check blood sugar 2 times per day. 200 each 3  . ONETOUCH DELICA LANCETS 33G MISC 1 Package by Does not apply route 2 (two) times daily. Use to check blood sugars twice daily E11.65 200 each 11  . rosuvastatin (CRESTOR) 10 MG tablet Take 10 mg by mouth every morning.     . trospium (SANCTURA) 20 MG tablet Take 20 mg by mouth 2 (two) times daily.  3  . valsartan-hydrochlorothiazide (DIOVAN-HCT) 320-25 MG per tablet Take 1 tablet by mouth every morning.      No current facility-administered medications on file prior to visit.     Allergies  Allergen Reactions  . Colchicine     Stomach cramps  . Diclofenac     Oral causes stomach cramps  . Janumet [Sitagliptin-Metformin Hcl] Nausea And Vomiting    Lightheaded   . Septra [Sulfamethoxazole-Trimethoprim] Itching  . Trulicity [Dulaglutide] Diarrhea and Nausea And Vomiting  . Zetia [Ezetimibe]     Muscle pain  .  Imipramine Rash  . Tape Rash    Paper tape ok to use     Family History  Problem Relation Age of Onset  . Diabetes Maternal Grandmother   .  Diabetes Maternal Grandfather     There were no vitals taken for this visit.  Review of Systems She denies hypoglycemia    Objective:   Physical Exam   outside test results are reviewed: A1c=7.4%    Assessment & Plan:  Insulin-requiring type 2 DM: this is the best control this pt should aim for, given this regimen, which does match insulin to her changing needs throughout the day.  we discussed.  She declines to add humalog with supper Renal insuff: she may need to change to a faster-acting qd insulin, but we'll continue the same for now.    Patient Instructions  Please continue the same insulin Please come back for a follow-up appointment in 3 months

## 2019-01-03 NOTE — Patient Instructions (Signed)
Please continue the same insulin.  Please come back for a follow-up appointment in 3 months.   

## 2019-02-28 ENCOUNTER — Telehealth: Payer: Self-pay | Admitting: Endocrinology

## 2019-02-28 NOTE — Telephone Encounter (Signed)
Please advise 

## 2019-02-28 NOTE — Telephone Encounter (Signed)
LVM requesting returned call 

## 2019-02-28 NOTE — Telephone Encounter (Signed)
Patient called to advised that she is getting ready to have gum graft/oral surgery and states that last time she had this done her insulin had to be adjusted.  Wants to know how to adjust.    Call back number [phone], ok to leave message

## 2019-02-28 NOTE — Telephone Encounter (Signed)
Take 100 units day prior to surgery.  Take 50 units day of surgery.  Call if questions

## 2019-02-28 NOTE — Telephone Encounter (Signed)
Pt returned call. Informed of Dr. Ellison's orders. Verbalized acceptance and understanding. 

## 2019-05-10 ENCOUNTER — Other Ambulatory Visit: Payer: Self-pay

## 2019-05-14 ENCOUNTER — Other Ambulatory Visit: Payer: Self-pay

## 2019-05-14 ENCOUNTER — Encounter: Payer: Self-pay | Admitting: Endocrinology

## 2019-05-14 ENCOUNTER — Ambulatory Visit: Payer: Medicare Other | Admitting: Endocrinology

## 2019-05-14 VITALS — BP 138/66 | HR 113 | Ht 63.0 in | Wt 228.6 lb

## 2019-05-14 DIAGNOSIS — Z23 Encounter for immunization: Secondary | ICD-10-CM

## 2019-05-14 DIAGNOSIS — E1121 Type 2 diabetes mellitus with diabetic nephropathy: Secondary | ICD-10-CM | POA: Diagnosis not present

## 2019-05-14 DIAGNOSIS — N1831 Chronic kidney disease, stage 3a: Secondary | ICD-10-CM

## 2019-05-14 LAB — POCT GLYCOSYLATED HEMOGLOBIN (HGB A1C): Hemoglobin A1C: 7.3 % — AB (ref 4.0–5.6)

## 2019-05-14 NOTE — Progress Notes (Signed)
Subjective:    Patient ID: Pamela Love, female    DOB: 1951/07/20, 68 y.o.   MRN: [id number]  HPI Pt returns for f/u of diabetes mellitus: DM type: Insulin-requiring type 2 Dx'ed: 2012 Complications: renal insufficiency.  Therapy: insulin since 2019 GDM: never.  DKA: never.  Severe hypoglycemia: never.  Pancreatitis: never.  Other: she also took insulin for 6 months, in 2016; renal insuff and edema limit rx options; she did not tolerate trulicity (nausea), or repaglinide (diarrhea); she declines multiple daily injections.   Interval history: she brings a record of her cbg's which I have reviewed today.  cbg varies from 82-242.  There is no trend throughout the day.  pt states she feels well in general, except for weight gain.   Past Medical History:  Diagnosis Date  . Arthritis   . Bursitis    hips  . C. difficile colitis 12/2017  . Complication of anesthesia    "spinal did not work with second c section"  . Diabetes mellitus without complication (HCC)    diet controlled - has Rx for Glymipride but has not started taking yet  . GERD (gastroesophageal reflux disease)   . History of nonmelanoma skin cancer   . History of skin cancer   . Hyperlipidemia   . Hypertension   . OAB (overactive bladder)   . Osteoporosis     Past Surgical History:  Procedure Laterality Date  . ABDOMINAL HYSTERECTOMY  2005  . CESAREAN SECTION     x2  . colonscopy     . EYE SURGERY     bilat cataract surgery 2015  . TOTAL KNEE ARTHROPLASTY Left 12/18/2014   Procedure: TOTAL LEFT KNEE ARTHROPLASTY;  Surgeon: Eugenia Mcalpine, MD;  Location: [facility];  Service: Orthopedics;  Laterality: Left;  . TOTAL KNEE ARTHROPLASTY Right 05/15/2015   Procedure: RIGHT TOTAL KNEE ARTHROPLASTY;  Surgeon: Eugenia Mcalpine, MD;  Location: [facility];  Service: Orthopedics;  Laterality: Right;  . UTERINE FIBROID SURGERY      Social History   Socioeconomic History  . Marital status: Married    Spouse name: Not on  file  . Number of children: Not on file  . Years of education: Not on file  . Highest education level: Not on file  Occupational History  . Not on file  Social Needs  . Financial resource strain: Not on file  . Food insecurity    Worry: Not on file    Inability: Not on file  . Transportation needs    Medical: Not on file    Non-medical: Not on file  Tobacco Use  . Smoking status: Never Smoker  . Smokeless tobacco: Never Used  Substance and Sexual Activity  . Alcohol use: Yes    Comment: rare  . Drug use: No  . Sexual activity: Not on file  Lifestyle  . Physical activity    Days per week: Not on file    Minutes per session: Not on file  . Stress: Not on file  Relationships  . Social Musician on phone: Not on file    Gets together: Not on file    Attends religious service: Not on file    Active member of club or organization: Not on file    Attends meetings of clubs or organizations: Not on file    Relationship status: Not on file  . Intimate partner violence    Fear of current or ex partner: Not on file  Emotionally abused: Not on file    Physically abused: Not on file    Forced sexual activity: Not on file  Other Topics Concern  . Not on file  Social History Narrative  . Not on file    Current Outpatient Medications on File Prior to Visit  Medication Sig Dispense Refill  . acetaminophen (TYLENOL ARTHRITIS PAIN) 650 MG CR tablet Take 1,300 mg by mouth at bedtime as needed for pain.    . Calcium-Vitamin D (CALTRATE 600 PLUS-VIT D PO) Take 1 tablet by mouth every morning.     . Cholecalciferol (VITAMIN D) 2000 UNITS CAPS Take 2,000 Units by mouth every morning.     . Coenzyme Q10 (COQ10) 200 MG CAPS Take 200 mg by mouth at bedtime.     . [facility] DIGESTIVE PROBIOTIC 250 MG capsule Take by mouth daily.  0  . cyclobenzaprine (FLEXERIL) 10 MG tablet Take 1 tablet (10 mg total) by mouth 2 (two) times daily as needed for muscle spasms. 20 tablet 0  . diclofenac  sodium (VOLTAREN) 1 % GEL Apply 1 application topically at bedtime as needed (knee pain.). Applied knees    . DULoxetine (CYMBALTA) 20 MG capsule Take 40 mg by mouth at bedtime.     . folic acid (FOLVITE) 800 MCG tablet Take 800 mcg by mouth at bedtime.     . Insulin Glargine (LANTUS SOLOSTAR) 100 UNIT/ML Solostar Pen Inject 130 Units into the skin every morning. 15 pen 11  . Multiple Vitamin (MULTIVITAMIN WITH MINERALS) TABS tablet Take 1 tablet by mouth every morning.     . Omega-3 Fatty Acids (RA FISH OIL) 1400 MG CPDR Take 1,400 mg by mouth 2 (two) times daily.    Marland Kitchen. omeprazole (PRILOSEC) 20 MG capsule Take 20 mg by mouth 2 (two) times daily.     . ONE TOUCH ULTRA TEST test strip Use to to check blood sugar 2 times per day. 200 each 3  . ONETOUCH DELICA LANCETS 33G MISC 1 Package by Does not apply route 2 (two) times daily. Use to check blood sugars twice daily E11.65 200 each 11  . rosuvastatin (CRESTOR) 10 MG tablet Take 10 mg by mouth every morning.     . trospium (SANCTURA) 20 MG tablet Take 20 mg by mouth 2 (two) times daily.  3  . valsartan-hydrochlorothiazide (DIOVAN-HCT) 320-25 MG per tablet Take 1 tablet by mouth every morning.      No current facility-administered medications on file prior to visit.     Allergies  Allergen Reactions  . Colchicine     Stomach cramps  . Diclofenac     Oral causes stomach cramps  . Janumet [Sitagliptin-Metformin Hcl] Nausea And Vomiting    Lightheaded   . Septra [Sulfamethoxazole-Trimethoprim] Itching  . Trulicity [Dulaglutide] Diarrhea and Nausea And Vomiting  . Zetia [Ezetimibe]     Muscle pain  . Imipramine Rash  . Tape Rash    Paper tape ok to use     Family History  Problem Relation Age of Onset  . Diabetes Maternal Grandmother   . Diabetes Maternal Grandfather     BP 138/66 (BP Location: Left Arm, Patient Position: Sitting, Cuff Size: Large)   Pulse (!) 113   Ht 5\' 3"  (1.6 m)   Wt 228 lb 9.6 oz (103.7 kg)   SpO2 97%   BMI  40.49 kg/m    Review of Systems She denies hypoglycemia    Objective:   Physical Exam VITAL SIGNS:  See  vs page GENERAL: no distress Pulses: dorsalis pedis intact bilat.   MSK: no deformity of the feet CV: trace bilat leg edema Skin:  no ulcer on the feet.  normal color and temp on the feet. Neuro: sensation is intact to touch on the feet  Lab Results  Component Value Date   HGBA1C 7.3 (A) 05/14/2019        Assessment & Plan:  Insulin-requiring type 2 DM, with renal insuff; this is the best control this pt should aim for, given this regimen, which does match insulin to her changing needs throughout the day.   Tachycardia, new.  I obtained outside TSH result: 1.7  Patient Instructions  Please continue the same insulin.   check your blood sugar twice a day.  vary the time of day when you check, between before the 3 meals, and at bedtime.  also check if you have symptoms of your blood sugar being too high or too low.  please keep a record of the readings and bring it to your next appointment here (or you can bring the meter itself).  You can write it on any piece of paper.  please call us sooner if your blood sugar goes below 70, or if you have a lot of readings over 200. When you see Dr Brigitte Pulse next month, please ask that the thyroid be checked along with any other blood tests you have, as your heart rate is fast today Please come back for a follow-up appointment in 3-4 months.

## 2019-05-14 NOTE — Patient Instructions (Addendum)
Please continue the same insulin.   check your blood sugar twice a day.  vary the time of day when you check, between before the 3 meals, and at bedtime.  also check if you have symptoms of your blood sugar being too high or too low.  please keep a record of the readings and bring it to your next appointment here (or you can bring the meter itself).  You can write it on any piece of paper.  please call us sooner if your blood sugar goes below 70, or if you have a lot of readings over 200. When you see Dr Brigitte Pulse next month, please ask that the thyroid be checked along with any other blood tests you have, as your heart rate is fast today Please come back for a follow-up appointment in 3-4 months.

## 2019-05-15 ENCOUNTER — Other Ambulatory Visit: Payer: Self-pay | Admitting: Endocrinology

## 2019-05-15 DIAGNOSIS — E1122 Type 2 diabetes mellitus with diabetic chronic kidney disease: Secondary | ICD-10-CM

## 2019-06-12 ENCOUNTER — Other Ambulatory Visit: Payer: Self-pay | Admitting: Obstetrics and Gynecology

## 2019-06-12 DIAGNOSIS — Z1231 Encounter for screening mammogram for malignant neoplasm of breast: Secondary | ICD-10-CM

## 2019-08-08 ENCOUNTER — Other Ambulatory Visit: Payer: Self-pay

## 2019-08-08 ENCOUNTER — Ambulatory Visit
Admission: RE | Admit: 2019-08-08 | Discharge: 2019-08-08 | Disposition: A | Discharge status: Home / Self Care | Payer: Medicare PPO | Source: Ambulatory Visit | Attending: Obstetrics and Gynecology | Admitting: Obstetrics and Gynecology

## 2019-08-08 DIAGNOSIS — Z1231 Encounter for screening mammogram for malignant neoplasm of breast: Secondary | ICD-10-CM

## 2019-08-14 ENCOUNTER — Ambulatory Visit: Payer: Medicare Other | Admitting: Endocrinology

## 2019-08-29 ENCOUNTER — Other Ambulatory Visit: Payer: Self-pay | Admitting: Endocrinology

## 2019-08-29 DIAGNOSIS — E1122 Type 2 diabetes mellitus with diabetic chronic kidney disease: Secondary | ICD-10-CM

## 2019-08-29 DIAGNOSIS — N183 Chronic kidney disease, stage 3 unspecified: Secondary | ICD-10-CM

## 2019-10-03 ENCOUNTER — Other Ambulatory Visit: Payer: Self-pay

## 2019-10-07 ENCOUNTER — Encounter: Payer: Self-pay | Admitting: Endocrinology

## 2019-10-07 ENCOUNTER — Other Ambulatory Visit: Payer: Self-pay

## 2019-10-07 ENCOUNTER — Ambulatory Visit: Payer: Medicare PPO | Admitting: Endocrinology

## 2019-10-07 VITALS — BP 130/82 | HR 103 | Ht 63.0 in | Wt 233.2 lb

## 2019-10-07 DIAGNOSIS — N183 Chronic kidney disease, stage 3 unspecified: Secondary | ICD-10-CM

## 2019-10-07 DIAGNOSIS — N1831 Chronic kidney disease, stage 3a: Secondary | ICD-10-CM

## 2019-10-07 DIAGNOSIS — E1122 Type 2 diabetes mellitus with diabetic chronic kidney disease: Secondary | ICD-10-CM | POA: Diagnosis not present

## 2019-10-07 DIAGNOSIS — E1121 Type 2 diabetes mellitus with diabetic nephropathy: Secondary | ICD-10-CM | POA: Diagnosis not present

## 2019-10-07 LAB — POCT GLYCOSYLATED HEMOGLOBIN (HGB A1C): Hemoglobin A1C: 7.7 % — AB (ref 4.0–5.6)

## 2019-10-07 MED ORDER — FLUCONAZOLE 150 MG PO TABS: 150.0000 mg | ORAL_TABLET | Freq: Every day | ORAL | 3 refills | Status: DC

## 2019-10-07 MED ORDER — ACCU-CHEK AVIVA PLUS VI STRP: 1.0000 | ORAL_STRIP | Freq: Two times a day (BID) | 3 refills | Status: DC

## 2019-10-07 MED ORDER — ACCU-CHEK AVIVA PLUS W/DEVICE KIT: 1.0000 | PACK | Freq: Once | 0 refills | Status: DC

## 2019-10-07 MED ORDER — TOUJEO MAX SOLOSTAR 300 UNIT/ML ~~LOC~~ SOPN: 130.0000 [IU] | PEN_INJECTOR | SUBCUTANEOUS | 3 refills | Status: DC

## 2019-10-07 NOTE — Progress Notes (Addendum)
Subjective:    Patient ID: Pamela Love, female    DOB: 1950-11-28, 69 y.o.   MRN: [id number]  HPI Pt returns for f/u of diabetes mellitus: DM type: Insulin-requiring type 2 Dx'ed: 2012 Complications: renal insufficiency.  Therapy: insulin since 2019 GDM: never.  DKA: never.  Severe hypoglycemia: never.  Pancreatitis: never.  Other: she also took insulin for 6 months, in 2016; renal insuff and edema limit rx options; she did not tolerate trulicity (nausea), or repaglinide (diarrhea); she declines multiple daily injections.   Interval history: she brings a record of her cbg's which I have reviewed today.  cbg varies from 102-193.  There is no trend throughout the day.  pt states she feels well in general, except for ongoing weight gain.  She takes 130 units qam.   She says TFT were normal at Dr Alver Fisher office.   Past Medical History:  Diagnosis Date  . Arthritis   . Bursitis    hips  . C. difficile colitis 12/2017  . Complication of anesthesia    "spinal did not work with second c section"  . Diabetes mellitus without complication (HCC)    diet controlled - has Rx for Glymipride but has not started taking yet  . GERD (gastroesophageal reflux disease)   . History of nonmelanoma skin cancer   . History of skin cancer   . Hyperlipidemia   . Hypertension   . OAB (overactive bladder)   . Osteoporosis     Past Surgical History:  Procedure Laterality Date  . ABDOMINAL HYSTERECTOMY  2005  . CESAREAN SECTION     x2  . colonscopy     . EYE SURGERY     bilat cataract surgery 2015  . TOTAL KNEE ARTHROPLASTY Left 12/18/2014   Procedure: TOTAL LEFT KNEE ARTHROPLASTY;  Surgeon: Eugenia Mcalpine, MD;  Location: [facility];  Service: Orthopedics;  Laterality: Left;  . TOTAL KNEE ARTHROPLASTY Right 05/15/2015   Procedure: RIGHT TOTAL KNEE ARTHROPLASTY;  Surgeon: Eugenia Mcalpine, MD;  Location: [facility];  Service: Orthopedics;  Laterality: Right;  . UTERINE FIBROID SURGERY      Social  History   Socioeconomic History  . Marital status: Married    Spouse name: Not on file  . Number of children: Not on file  . Years of education: Not on file  . Highest education level: Not on file  Occupational History  . Not on file  Tobacco Use  . Smoking status: Never Smoker  . Smokeless tobacco: Never Used  Substance and Sexual Activity  . Alcohol use: Yes    Comment: rare  . Drug use: No  . Sexual activity: Not on file  Other Topics Concern  . Not on file  Social History Narrative  . Not on file   Social Determinants of Health   Financial Resource Strain:   . Difficulty of Paying Living Expenses: Not on file  Food Insecurity:   . Worried About Programme researcher, broadcasting/film/video in the Last Year: Not on file  . Ran Out of Food in the Last Year: Not on file  Transportation Needs:   . Lack of Transportation (Medical): Not on file  . Lack of Transportation (Non-Medical): Not on file  Physical Activity:   . Days of Exercise per Week: Not on file  . Minutes of Exercise per Session: Not on file  Stress:   . Feeling of Stress : Not on file  Social Connections:   . Frequency of Communication with Friends and  Family: Not on file  . Frequency of Social Gatherings with Friends and Family: Not on file  . Attends Religious Services: Not on file  . Active Member of Clubs or Organizations: Not on file  . Attends Archivist Meetings: Not on file  . Marital Status: Not on file  Intimate Partner Violence:   . Fear of Current or Ex-Partner: Not on file  . Emotionally Abused: Not on file  . Physically Abused: Not on file  . Sexually Abused: Not on file    Current Outpatient Medications on File Prior to Visit  Medication Sig Dispense Refill  . acetaminophen (TYLENOL ARTHRITIS PAIN) 650 MG CR tablet Take 1,300 mg by mouth at bedtime as needed for pain.    . Calcium-Vitamin D (CALTRATE 600 PLUS-VIT D PO) Take 1 tablet by mouth every morning.     . Cholecalciferol (VITAMIN D) 2000  UNITS CAPS Take 2,000 Units by mouth every morning.     . Coenzyme Q10 (COQ10) 200 MG CAPS Take 200 mg by mouth at bedtime.     . [facility] DIGESTIVE PROBIOTIC 250 MG capsule Take by mouth daily.  0  . cyclobenzaprine (FLEXERIL) 10 MG tablet Take 1 tablet (10 mg total) by mouth 2 (two) times daily as needed for muscle spasms. 20 tablet 0  . diclofenac sodium (VOLTAREN) 1 % GEL Apply 1 application topically at bedtime as needed (knee pain.). Applied knees    . DULoxetine (CYMBALTA) 20 MG capsule Take 40 mg by mouth at bedtime.     . folic acid (FOLVITE) 902 MCG tablet Take 800 mcg by mouth at bedtime.     . Multiple Vitamin (MULTIVITAMIN WITH MINERALS) TABS tablet Take 1 tablet by mouth every morning.     . Omega-3 Fatty Acids (RA FISH OIL) 1400 MG CPDR Take 1,400 mg by mouth 2 (two) times daily.    Marland Kitchen omeprazole (PRILOSEC) 20 MG capsule Take 20 mg by mouth 2 (two) times daily.     . rosuvastatin (CRESTOR) 10 MG tablet Take 10 mg by mouth every morning.     . trospium (SANCTURA) 20 MG tablet Take 20 mg by mouth 2 (two) times daily.  3  . valsartan-hydrochlorothiazide (DIOVAN-HCT) 320-25 MG per tablet Take 1 tablet by mouth every morning.      No current facility-administered medications on file prior to visit.    Allergies  Allergen Reactions  . Colchicine     Stomach cramps  . Diclofenac     Oral causes stomach cramps  . Janumet [Sitagliptin-Metformin Hcl] Nausea And Vomiting    Lightheaded   . Septra [Sulfamethoxazole-Trimethoprim] Itching  . Trulicity [Dulaglutide] Diarrhea and Nausea And Vomiting  . Zetia [Ezetimibe]     Muscle pain  . Imipramine Rash  . Tape Rash    Paper tape ok to use     Family History  Problem Relation Age of Onset  . Diabetes Maternal Grandmother   . Diabetes Maternal Grandfather     BP 130/82 (BP Location: Left Arm, Patient Position: Sitting, Cuff Size: Large)   Pulse (!) 103   Ht 5\' 3"  (1.6 m)   Wt 233 lb 3.2 oz (105.8 kg)   SpO2 99%   BMI 41.31 kg/m     Review of Systems She denies hypoglycemia.  She has intermitt vag itching.      Objective:   Physical Exam VITAL SIGNS:  See vs page GENERAL: no distress Pulses: dorsalis pedis intact bilat.   MSK: no deformity  of the feet CV: 1+ bilat leg edema Skin:  no ulcer on the feet, but there are bilat heavy calluses.  normal color and temp on the feet. Neuro: sensation is intact to touch on the feet  Lab Results  Component Value Date   HGBA1C 7.7 (A) 10/07/2019      Assessment & Plan:  Type 2 DM: this is the best control this pt should aim for, given this regimen, which does match insulin to her changing needs throughout the day Edema: This limits rx options Renal insuff: This also limits rx options Vaginitis, due to [place].  Patient Instructions  Please change the insulin to "T[place]jeo," at the same amount.   I have sent a prescription to your pharmacy, for the yeast infection check your blood sugar twice a day.  vary the time of day when you check, between before the 3 meals, and at bedtime.  also check if you have symptoms of your blood sugar being too high or too low.  please keep a record of the readings and bring it to your next appointment here (or you can bring the meter itself).  You can write it on any piece of paper.  please call us sooner if your blood sugar goes below 70, or if you have a lot of readings over 200. Please come back for a follow-up appointment in 3-4 months.

## 2019-10-07 NOTE — Patient Instructions (Addendum)
Please change the insulin to "Toujeo," at the same amount.   I have sent a prescription to your pharmacy, for the yeast infection check your blood sugar twice a day.  vary the time of day when you check, between before the 3 meals, and at bedtime.  also check if you have symptoms of your blood sugar being too high or too low.  please keep a record of the readings and bring it to your next appointment here (or you can bring the meter itself).  You can write it on any piece of paper.  please call us sooner if your blood sugar goes below 70, or if you have a lot of readings over 200. Please come back for a follow-up appointment in 3-4 months.

## 2019-10-09 ENCOUNTER — Other Ambulatory Visit: Payer: Self-pay

## 2019-10-09 ENCOUNTER — Telehealth: Payer: Self-pay | Admitting: Endocrinology

## 2019-10-09 DIAGNOSIS — E1122 Type 2 diabetes mellitus with diabetic chronic kidney disease: Secondary | ICD-10-CM

## 2019-10-09 MED ORDER — ACCU-CHEK AVIVA PLUS VI STRP: 1.0000 | ORAL_STRIP | Freq: Two times a day (BID) | 3 refills | Status: DC

## 2019-10-09 MED ORDER — ACCU-CHEK SOFTCLIX LANCETS MISC: 1.0000 | Freq: Two times a day (BID) | 0 refills | Status: DC

## 2019-10-09 NOTE — Telephone Encounter (Signed)
MEDICATION: Aviva Lancets  PHARMACY:  [facility] [address]   IS THIS A 90 DAY SUPPLY :   IS PATIENT OUT OF MEDICATION: NEW RX  IF NOT; HOW MUCH IS LEFT:   LAST APPOINTMENT DATE: @3 /03/2020  NEXT APPOINTMENT DATE:@7 /07/2020  DO WE HAVE YOUR PERMISSION TO LEAVE A DETAILED MESSAGE: yes  OTHER COMMENTS:    **Let patient know to contact pharmacy at the end of the day to make sure medication is ready. **  ** Please notify patient to allow 48-72 hours to process**  **Encourage patient to contact the pharmacy for refills or they can request refills through [facility]**

## 2019-10-09 NOTE — Telephone Encounter (Signed)
Outpatient Medication Detail   Disp Refills Start End   Accu-Chek Softclix Lancets lancets 180 each 0 10/09/2019    Sig - Route: 1 each by Other route in the morning and at bedtime. E11.9 - Other   Sent to pharmacy as: Accu-Chek Softclix Lancets lancets   E-Prescribing Status: Receipt confirmed by pharmacy (10/09/2019  2:57 PM EST)

## 2019-10-17 ENCOUNTER — Other Ambulatory Visit: Payer: Self-pay

## 2019-10-17 ENCOUNTER — Telehealth: Payer: Self-pay | Admitting: Endocrinology

## 2019-10-17 DIAGNOSIS — E1122 Type 2 diabetes mellitus with diabetic chronic kidney disease: Secondary | ICD-10-CM

## 2019-10-17 MED ORDER — ACCU-CHEK GUIDE VI STRP: 1.0000 | ORAL_STRIP | Freq: Two times a day (BID) | 0 refills | Status: DC

## 2019-10-17 MED ORDER — ACCU-CHEK GUIDE ME W/DEVICE KIT: 1.0000 | PACK | Freq: Two times a day (BID) | 0 refills | Status: DC

## 2019-10-17 NOTE — Telephone Encounter (Signed)
E-Prescribing Status: Receipt confirmed by pharmacy (10/17/2019  9:54 AM EDT)

## 2019-10-17 NOTE — Telephone Encounter (Signed)
Patient called re: Patient is unable to get the Accucheck Aviva Plus Testing System. Patient requests that a new RX for Accucheck Guide Me Testing System (is covered by insurance) and test strips that go with it be sent to:  [id number] [place], [place] - [address] Phone:  [phone]  Fax:  [phone]

## 2019-11-04 ENCOUNTER — Other Ambulatory Visit: Payer: Self-pay | Admitting: Endocrinology

## 2019-11-04 DIAGNOSIS — E1122 Type 2 diabetes mellitus with diabetic chronic kidney disease: Secondary | ICD-10-CM

## 2019-12-11 ENCOUNTER — Encounter: Payer: Self-pay | Admitting: Endocrinology

## 2019-12-11 DIAGNOSIS — N183 Chronic kidney disease, stage 3 unspecified: Secondary | ICD-10-CM | POA: Diagnosis not present

## 2019-12-11 DIAGNOSIS — R000 Tachycardia, unspecified: Secondary | ICD-10-CM | POA: Insufficient documentation

## 2019-12-11 DIAGNOSIS — R0609 Other forms of dyspnea: Secondary | ICD-10-CM | POA: Diagnosis not present

## 2019-12-11 DIAGNOSIS — I129 Hypertensive chronic kidney disease with stage 1 through stage 4 chronic kidney disease, or unspecified chronic kidney disease: Secondary | ICD-10-CM | POA: Diagnosis not present

## 2019-12-11 DIAGNOSIS — E782 Mixed hyperlipidemia: Secondary | ICD-10-CM | POA: Diagnosis not present

## 2019-12-11 DIAGNOSIS — E7800 Pure hypercholesterolemia, unspecified: Secondary | ICD-10-CM | POA: Insufficient documentation

## 2019-12-11 DIAGNOSIS — E1122 Type 2 diabetes mellitus with diabetic chronic kidney disease: Secondary | ICD-10-CM | POA: Diagnosis not present

## 2019-12-11 HISTORY — DX: Pure hypercholesterolemia, unspecified: E78.00

## 2019-12-11 HISTORY — DX: Tachycardia, unspecified: R00.0

## 2019-12-12 ENCOUNTER — Encounter: Payer: Self-pay | Admitting: Cardiology

## 2019-12-12 ENCOUNTER — Other Ambulatory Visit: Payer: Self-pay

## 2019-12-12 ENCOUNTER — Ambulatory Visit: Payer: Medicare PPO | Admitting: Cardiology

## 2019-12-12 VITALS — BP 126/70 | HR 99 | Ht 63.0 in | Wt 232.0 lb

## 2019-12-12 DIAGNOSIS — E785 Hyperlipidemia, unspecified: Secondary | ICD-10-CM

## 2019-12-12 DIAGNOSIS — E7800 Pure hypercholesterolemia, unspecified: Secondary | ICD-10-CM

## 2019-12-12 DIAGNOSIS — G4700 Insomnia, unspecified: Secondary | ICD-10-CM

## 2019-12-12 DIAGNOSIS — R0602 Shortness of breath: Secondary | ICD-10-CM | POA: Diagnosis not present

## 2019-12-12 DIAGNOSIS — Z794 Long term (current) use of insulin: Secondary | ICD-10-CM | POA: Diagnosis not present

## 2019-12-12 DIAGNOSIS — E0800 Diabetes mellitus due to underlying condition with hyperosmolarity without nonketotic hyperglycemic-hyperosmolar coma (NKHHC): Secondary | ICD-10-CM | POA: Diagnosis not present

## 2019-12-12 DIAGNOSIS — I10 Essential (primary) hypertension: Secondary | ICD-10-CM | POA: Diagnosis not present

## 2019-12-12 NOTE — Progress Notes (Addendum)
Cardiology Office Note:    Date:  12/12/2019   ID:  Pamela Love, DOB 1950/11/16, MRN [id number]  PCP:  Mayra Neer, MD  Cardiologist:  Berniece Salines, DO  Electrophysiologist:  None   Referring MD: Mayra Neer, MD   Chief Complaint  Patient presents with  . Shortness of Breath    History of Present Illness:    Pamela Love is a 69 y.o. female with a hx of uncontrolled diabetes type 2 which is not insulin-dependent, hypertension, hyperlipidemia stage III, renal disease presents to be evaluated for shortness of breath.  Patient tells me that over the last 6 to 7 months she has been experiencing significant shortness of exertion.  She tells me that this is getting worse.  She reports that sometimes she is able to walk on flat ground without any problems however once he is inclined to get significant short of breath.  She can walk (without getting short of breath.  In addition today she tells me that she has been experiencing daytime somnolence.  Reports that her husband also hastold her that she works.  Is concerned about her shortness of breath given her significant family history of premature coronary artery disease in her mother, father actually died due to a massive heart attack and multiple close relatives.  Past Medical History:  Diagnosis Date  . Arthritis   . Bursitis    hips  . C. difficile colitis 12/2017  . Complication of anesthesia    "spinal did not work with second c section"  . Diabetes mellitus ([place]) 11/19/2015  . Diabetes mellitus without complication ([place])    diet controlled - has Rx for Glymipride but has not started taking yet  . Dyslipidemia 10/21/2014  . GERD (gastroesophageal reflux disease)   . History of nonmelanoma skin cancer   . History of skin cancer   . Hypercholesterolemia 12/11/2019  . Hyperlipidemia   . Hypertension   . Hypertensive disorder 10/21/2014  . OAB (overactive bladder)   . Osteoarthritis of right knee 05/15/2015    . Osteoarthrosis, unspecified whether generalized or localized, involving lower leg 10/21/2014  . Osteoporosis   . Primary osteoarthritis of left knee 12/18/2014  . S/P knee replacement 12/18/2014  . S/P total knee replacement using cement 05/15/2015  . Tachycardia 12/11/2019    Past Surgical History:  Procedure Laterality Date  . ABDOMINAL HYSTERECTOMY  2005  . CESAREAN SECTION     x2  . colonscopy     . EYE SURGERY     bilat cataract surgery 2015  . TOTAL KNEE ARTHROPLASTY Left 12/18/2014   Procedure: TOTAL LEFT KNEE ARTHROPLASTY;  Surgeon: Sydnee Cabal, MD;  Location: [facility];  Service: Orthopedics;  Laterality: Left;  . TOTAL KNEE ARTHROPLASTY Right 05/15/2015   Procedure: RIGHT TOTAL KNEE ARTHROPLASTY;  Surgeon: Sydnee Cabal, MD;  Location: [facility];  Service: Orthopedics;  Laterality: Right;  . UTERINE FIBROID SURGERY      Current Medications: Current Meds  Medication Sig  . Ascorbic Acid (VITAMIN C) 500 MG CAPS Take 500 mg by mouth daily.  . valsartan-hydrochlorothiazide (DIOVAN-HCT) 160-12.5 MG tablet Take 1 tablet by mouth daily.  . [DISCONTINUED] diclofenac Sodium (VOLTAREN) 1 % GEL Apply 1 application topically every 6 (six) hours as needed.  . [DISCONTINUED] saccharomyces boulardii (FLORASTOR) 250 MG capsule Take 250 mg by mouth daily.     Allergies:   Colchicine, Diclofenac, Janumet [sitagliptin-metformin hcl], Septra [sulfamethoxazole-trimethoprim], Trulicity [dulaglutide], Zetia [ezetimibe], Imipramine, and Tape   Social History  Socioeconomic History  . Marital status: Married    Spouse name: Not on file  . Number of children: Not on file  . Years of education: Not on file  . Highest education level: Not on file  Occupational History  . Not on file  Tobacco Use  . Smoking status: Never Smoker  . Smokeless tobacco: Never Used  Substance and Sexual Activity  . Alcohol use: Yes    Comment: rare  . Drug use: No  . Sexual activity: Not on file  Other  Topics Concern  . Not on file  Social History Narrative  . Not on file   Social Determinants of Health   Financial Resource Strain:   . Difficulty of Paying Living Expenses:   Food Insecurity:   . Worried About Charity fundraiser in the Last Year:   . Arboriculturist in the Last Year:   Transportation Needs:   . Film/video editor (Medical):   Marland Kitchen Lack of Transportation (Non-Medical):   Physical Activity:   . Days of Exercise per Week:   . Minutes of Exercise per Session:   Stress:   . Feeling of Stress :   Social Connections:   . Frequency of Communication with Friends and Family:   . Frequency of Social Gatherings with Friends and Family:   . Attends Religious Services:   . Active Member of Clubs or Organizations:   . Attends Archivist Meetings:   Marland Kitchen Marital Status:      Family History: The patient's family history includes Congenital heart disease in her son; Diabetes in her maternal grandfather; Heart attack in her father, maternal grandfather, paternal uncle, and sister; Heart disease in her mother.  ROS:   Review of Systems  Constitution: Negative for decreased appetite, fever and weight gain.  HENT: Negative for congestion, ear discharge, hoarse voice and sore throat.   Eyes: Negative for discharge, redness, vision loss in right eye and visual halos.  Cardiovascular: Reports dyspnea on exertion.  Denies any chest pain, leg swelling, orthopnea and palpitations.  Respiratory: Negative for cough, hemoptysis, shortness of breath and snoring.   Endocrine: Negative for heat intolerance and polyphagia.  Hematologic/Lymphatic: Negative for bleeding problem. Does not bruise/bleed easily.  Skin: Negative for flushing, nail changes, rash and suspicious lesions.  Musculoskeletal: Negative for arthritis, joint pain, muscle cramps, myalgias, neck pain and stiffness.  Gastrointestinal: Negative for abdominal pain, bowel incontinence, diarrhea and excessive appetite.    Genitourinary: Negative for decreased libido, genital sores and incomplete emptying.  Neurological: Negative for brief paralysis, focal weakness, headaches and loss of balance.  Psychiatric/Behavioral: Negative for altered mental status, depression and suicidal ideas.  Allergic/Immunologic: Negative for HIV exposure and persistent infections.    EKGs/Labs/Other Studies Reviewed:    The following studies were reviewed today:   EKG:  The ekg ordered today demonstrates sinus rhythm, heart rate 95 bpm, P wave morphology suggesting left atrial enlargement left anterior fascicular block.  EKG done in November 2019 left anterior fascicular block is not new.  Recent Labs: No results found for requested labs within last 8760 hours.  Blood work done on May 21 by her PCP office which was reviewed by me: Glucose 157, BUN 23, creatinine 0.85, GFR 66, sodium 140, potassium 4.0, chloride 105, bicarb 25, calcium 9.7, total protein 6.4, Albumin 4.1, total bili 0.6, alk phos 1025 CBC: WBC 6.0, hemoglobin 14.7, hematocrit 9.9, platelet 233 Recent Lipid Panel No results found for: CHOL, TRIG, HDL, CHOLHDL,  VLDL, LDLCALC, LDLDIRECT  5: Cholesterol 157, triglyceride 208, HDL 41, LDL 81  Physical Exam:    VS:  BP 126/70 (BP Location: Left Arm, Patient Position: Sitting, Cuff Size: Large)   Pulse 99   Ht '5\' 3"'$  (1.6 m)   Wt 232 lb (105.2 kg)   SpO2 96%   BMI 41.10 kg/m     Wt Readings from Last 3 Encounters:  12/12/19 232 lb (105.2 kg)  10/07/19 233 lb 3.2 oz (105.8 kg)  05/14/19 228 lb 9.6 oz (103.7 kg)     GEN: Well nourished, well developed in no acute distress HEENT: Normal NECK: No JVD; No carotid bruits LYMPHATICS: No lymphadenopathy CARDIAC: S1S2 noted,RRR, no murmurs, rubs, gallops RESPIRATORY:  Clear to auscultation without rales, wheezing or rhonchi  ABDOMEN: Soft, non-tender, non-distended, +bowel sounds, no guarding. EXTREMITIES: No edema, No cyanosis, no clubbing MUSCULOSKELETAL:   No deformity  SKIN: Warm and dry NEUROLOGIC:  Alert and oriented x 3, non-focal PSYCHIATRIC:  Normal affect, good insight  ASSESSMENT:    1. Shortness of breath   2. Insomnia, unspecified type   3. Essential hypertension   4. Diabetes mellitus due to underlying condition with hyperosmolarity without coma, with long-term current use of insulin ([place])   5. Dyslipidemia   6. Hypercholesterolemia    PLAN:     1.  I am concerned that her shortness of breath is for angina equivalent.  Therefore at this time I like to pursue an ischemic evaluation in this patient given her significant risk factors which includes diabetes, hypertension, family history of premature coronary artery disease, hyperlipidemia.  We discussed various options about her testing.  The plan is to move forward with a pharmacologic nuclear stress test.  I educated patient about this test and she is agreeable to proceed.   For completeness I will order echocardiogram to assess LV and RV function and any other structural abnormalities setting of shortness of breath. 2.  Diabetes mellitus-her diabetes is significantly uncontrolled however she has been working with her endocrinologist to titrate her insulin regimen.  3.  Blood pressure is appropriate at this time we will continue to monitor patient no changes will be made to this medication.  Antihypertensive regimen includes valsartan 160 mg daily and hydrochlorothiazide 12.5 mg daily.  4.  Hyperlipidemia I did review her lipid profile which was done at her PCP office continue her current regimen with rosuvastatin 10 mg daily.  5.  Kidney disease-discussed this again with the patient.  Nephrotoxins were advised to avoid.  6.  Obesity-the patient understands the need to lose weight with diet and exercise. We have discussed specific strategies for this.  7. I am concerned about sleep apnea in this patient therefore were going to set her up for sleep study.    The patient is  in agreement with the above plan. The patient left the office in stable condition.  The patient will follow up in   Medication Adjustments/Labs and Tests Ordered: Current medicines are reviewed at length with the patient today.  Concerns regarding medicines are outlined above.  Orders Placed This Encounter  Procedures  . MYOCARDIAL PERFUSION IMAGING  . EKG 12-Lead  . ECHOCARDIOGRAM COMPLETE  . Split night study   No orders of the defined types were placed in this encounter.   Patient Instructions  Medication Instructions:  Your physician recommends that you continue on your current medications as directed. Please refer to the Current Medication list given to you today.  *If you  need a refill on your cardiac medications before your next appointment, please call your pharmacy*   Lab Work: None If you have labs (blood work) drawn today and your tests are completely normal, you will receive your results only by: Marland Kitchen MyChart Message (if you have MyChart) OR . A paper copy in the mail If you have any lab test that is abnormal or we need to change your treatment, we will call you to review the results.   Testing/Procedures: Your physician has requested that you have an echocardiogram. Echocardiography is a painless test that uses sound waves to create images of your heart. It provides your doctor with information about the size and shape of your heart and how well your heart's chambers and valves are working. This procedure takes approximately one hour. There are no restrictions for this procedure.    [facility] Nuclear Imaging [address] [place], [place] [address] Phone:  [phone]    Please arrive 15 minutes prior to your appointment time for registration and insurance purposes.  The test will take approximately 3 to 4 hours to complete; you may bring reading material.  If someone comes with you to your appointment, they will need to remain in the main lobby due  to limited space in the testing area. **If you are pregnant or breastfeeding, please notify the nuclear lab prior to your appointment**  How to prepare for your Myocardial Perfusion Test: . Do not eat or drink 3 hours prior to your test, except you may have water. . Do not consume products containing caffeine (regular or decaffeinated) 12 hours prior to your test. (ex: coffee, chocolate, sodas, tea). . Do bring a list of your current medications with you.  If not listed below, you may take your medications as normal. . HOLD diabetic medication/insulin the morning of the test. Take half of long acting insulin the night before test. . Do wear comfortable clothes (no dresses or overalls) and walking shoes, tennis shoes preferred (No heels or open toe shoes are allowed). . Do NOT wear cologne, perfume, aftershave, or lotions (deodorant is allowed). . If these instructions are not followed, your test will have to be rescheduled.  Please report to [address] for your test.  If you have questions or concerns about your appointment, you can call the [facility] at [phone].  If you cannot keep your appointment, please provide 24 hours notification to the Nuclear Lab, to avoid a possible $50 charge to your account.  We have put in an order for you to have a sleep study done. We will call you to make this appointment. Follow-Up: At [facility], you and your health needs are our priority.  As part of our continuing mission to provide you with exceptional heart care, we have created designated Provider Care Teams.  These Care Teams include your primary Cardiologist (physician) and Advanced Practice Providers (APPs -  Physician Assistants and Nurse Practitioners) who all work together to provide you with the care you need, when you need it.  We recommend signing up for the patient portal called "MyChart".  Sign up information is provided on this After Visit  Summary.  MyChart is used to connect with patients for Virtual Visits (Telemedicine).  Patients are able to view lab/test results, encounter notes, upcoming appointments, etc.  Non-urgent messages can be sent to your provider as well.   To learn more about what you can do with MyChart, go to  [email or web address].    Your next appointment:   3 month(s)  The format for your next appointment:   In Person  Provider:   Berniece Salines, DO   Other Instructions      Adopting a Healthy Lifestyle.  Know what a healthy weight is for you (roughly BMI <25) and aim to maintain this   Aim for 7+ servings of fruits and vegetables daily   65-80+ fluid ounces of water or unsweet tea for healthy kidneys   Limit to max 1 drink of alcohol per day; avoid smoking/tobacco   Limit animal fats in diet for cholesterol and heart health - choose grass fed whenever available   Avoid highly processed foods, and foods high in saturated/trans fats   Aim for low stress - take time to unwind and care for your mental health   Aim for 150 min of moderate intensity exercise weekly for heart health, and weights twice weekly for bone health   Aim for 7-9 hours of sleep daily   When it comes to diets, agreement about the perfect plan isnt easy to find, even among the experts. Experts at the [employer] developed an idea known as the Healthy Eating Plate. Just imagine a plate divided into logical, healthy portions.   The emphasis is on diet quality:   Load up on vegetables and fruits - one-half of your plate: Aim for color and variety, and remember that potatoes dont count.   Go for whole grains - one-quarter of your plate: Whole wheat, barley, wheat berries, quinoa, oats, brown rice, and foods made with them. If you want pasta, go with whole wheat pasta.   Protein power - one-quarter of your plate: Fish, chicken, beans, and nuts are all healthy, versatile protein sources. Limit red  meat.   The diet, however, does go beyond the plate, offering a few other suggestions.   Use healthy plant oils, such as olive, canola, soy, corn, sunflower and peanut. Check the labels, and avoid partially hydrogenated oil, which have unhealthy trans fats.   If youre thirsty, drink water. Coffee and tea are good in moderation, but skip sugary drinks and limit milk and dairy products to one or two daily servings.   The type of carbohydrate in the diet is more important than the amount. Some sources of carbohydrates, such as vegetables, fruits, whole grains, and beans-are healthier than others.   Finally, stay active  Signed, Berniece Salines, DO  12/12/2019 8:01 PM    [facility]

## 2019-12-12 NOTE — Patient Instructions (Addendum)
Medication Instructions:  Your physician recommends that you continue on your current medications as directed. Please refer to the Current Medication list given to you today.  *If you need a refill on your cardiac medications before your next appointment, please call your pharmacy*   Lab Work: None If you have labs (blood work) drawn today and your tests are completely normal, you will receive your results only by: Marland Kitchen MyChart Message (if you have MyChart) OR . A paper copy in the mail If you have any lab test that is abnormal or we need to change your treatment, we will call you to review the results.   Testing/Procedures: Your physician has requested that you have an echocardiogram. Echocardiography is a painless test that uses sound waves to create images of your heart. It provides your doctor with information about the size and shape of your heart and how well your heart's chambers and valves are working. This procedure takes approximately one hour. There are no restrictions for this procedure.    [facility] Nuclear Imaging [address] [place], [place] [address] Phone:  [phone]    Please arrive 15 minutes prior to your appointment time for registration and insurance purposes.  The test will take approximately 3 to 4 hours to complete; you may bring reading material.  If someone comes with you to your appointment, they will need to remain in the main lobby due to limited space in the testing area. **If you are pregnant or breastfeeding, please notify the nuclear lab prior to your appointment**  How to prepare for your Myocardial Perfusion Test: . Do not eat or drink 3 hours prior to your test, except you may have water. . Do not consume products containing caffeine (regular or decaffeinated) 12 hours prior to your test. (ex: coffee, chocolate, sodas, tea). . Do bring a list of your current medications with you.  If not listed below, you may take your medications  as normal. . HOLD diabetic medication/insulin the morning of the test. Take half of long acting insulin the night before test. . Do wear comfortable clothes (no dresses or overalls) and walking shoes, tennis shoes preferred (No heels or open toe shoes are allowed). . Do NOT wear cologne, perfume, aftershave, or lotions (deodorant is allowed). . If these instructions are not followed, your test will have to be rescheduled.  Please report to [address] for your test.  If you have questions or concerns about your appointment, you can call the [facility] at [phone].  If you cannot keep your appointment, please provide 24 hours notification to the Nuclear Lab, to avoid a possible $50 charge to your account.  We have put in an order for you to have a sleep study done. We will call you to make this appointment. Follow-Up: At [facility], you and your health needs are our priority.  As part of our continuing mission to provide you with exceptional heart care, we have created designated Provider Care Teams.  These Care Teams include your primary Cardiologist (physician) and Advanced Practice Providers (APPs -  Physician Assistants and Nurse Practitioners) who all work together to provide you with the care you need, when you need it.  We recommend signing up for the patient portal called "MyChart".  Sign up information is provided on this After Visit Summary.  MyChart is used to connect with patients for Virtual Visits (Telemedicine).  Patients are able to view lab/test results, encounter notes,  upcoming appointments, etc.  Non-urgent messages can be sent to your provider as well.   To learn more about what you can do with MyChart, go to [email or web address].    Your next appointment:   3 month(s)  The format for your next appointment:   In Person  Provider:   Thomasene Ripple, DO   Other Instructions

## 2019-12-24 ENCOUNTER — Encounter: Payer: Self-pay | Admitting: *Deleted

## 2019-12-24 ENCOUNTER — Telehealth: Payer: Self-pay | Admitting: *Deleted

## 2019-12-24 NOTE — Telephone Encounter (Signed)
Patient given detailed instructions per Myocardial Perfusion Study Information Sheet for the test on 12/31/2019 at 1100. Patient notified to arrive 15 minutes early and that it is imperative to arrive on time for appointment to keep from having the test rescheduled.  If you need to cancel or reschedule your appointment, please call the office within 24 hours of your appointment. . Patient verbalized understanding.Amour Cutrone, Adelene Idler Mychart letter sent with instructions.

## 2019-12-25 ENCOUNTER — Other Ambulatory Visit: Payer: Self-pay

## 2019-12-25 ENCOUNTER — Telehealth: Payer: Self-pay | Admitting: *Deleted

## 2019-12-25 ENCOUNTER — Ambulatory Visit (INDEPENDENT_AMBULATORY_CARE_PROVIDER_SITE_OTHER): Payer: Medicare PPO

## 2019-12-25 DIAGNOSIS — R0602 Shortness of breath: Secondary | ICD-10-CM

## 2019-12-25 NOTE — Telephone Encounter (Signed)
I am routing this to Dr. Servando Salina to see if we need to put in a referral for the sleep study or what she recommends to be done.

## 2019-12-25 NOTE — Telephone Encounter (Signed)
Pt was here today for echo and inquired about the sleep study that she needs. Please advise

## 2019-12-25 NOTE — Progress Notes (Signed)
Echocardiogram completed.

## 2019-12-31 ENCOUNTER — Telehealth: Payer: Self-pay | Admitting: *Deleted

## 2019-12-31 ENCOUNTER — Ambulatory Visit (INDEPENDENT_AMBULATORY_CARE_PROVIDER_SITE_OTHER): Payer: Medicare PPO

## 2019-12-31 ENCOUNTER — Other Ambulatory Visit: Payer: Self-pay

## 2019-12-31 VITALS — Ht 63.0 in | Wt 232.0 lb

## 2019-12-31 DIAGNOSIS — R0602 Shortness of breath: Secondary | ICD-10-CM

## 2019-12-31 DIAGNOSIS — I10 Essential (primary) hypertension: Secondary | ICD-10-CM

## 2019-12-31 MED ORDER — TECHNETIUM TC 99M TETROFOSMIN IV KIT
30.4000 | PACK | Freq: Once | INTRAVENOUS | Status: AC | PRN
  Administered 2019-12-31: 30.4 via INTRAVENOUS

## 2019-12-31 MED ORDER — REGADENOSON 0.4 MG/5ML IV SOLN
0.4000 mg | Freq: Once | INTRAVENOUS | Status: AC
  Administered 2019-12-31: 0.4 mg via INTRAVENOUS

## 2019-12-31 NOTE — Telephone Encounter (Signed)
PA request for sleep study submitted to [employer] via web portal. 

## 2020-01-01 ENCOUNTER — Ambulatory Visit: Payer: Medicare PPO

## 2020-01-01 ENCOUNTER — Telehealth: Payer: Self-pay | Admitting: *Deleted

## 2020-01-01 MED ORDER — TECHNETIUM TC 99M TETROFOSMIN IV KIT
31.7000 | PACK | Freq: Once | INTRAVENOUS | Status: AC | PRN
  Administered 2020-01-01: 31.7 via INTRAVENOUS

## 2020-01-01 NOTE — Telephone Encounter (Signed)
Pt wants to know when sleep study will be scheduled. Please advise

## 2020-01-02 ENCOUNTER — Encounter: Payer: Self-pay | Admitting: Cardiology

## 2020-01-02 ENCOUNTER — Other Ambulatory Visit: Payer: Self-pay

## 2020-01-02 ENCOUNTER — Ambulatory Visit: Payer: Medicare PPO | Admitting: Cardiology

## 2020-01-02 VITALS — BP 138/74 | HR 98 | Ht 63.0 in | Wt 232.8 lb

## 2020-01-02 DIAGNOSIS — R9439 Abnormal result of other cardiovascular function study: Secondary | ICD-10-CM

## 2020-01-02 DIAGNOSIS — E0800 Diabetes mellitus due to underlying condition with hyperosmolarity without nonketotic hyperglycemic-hyperosmolar coma (NKHHC): Secondary | ICD-10-CM | POA: Diagnosis not present

## 2020-01-02 DIAGNOSIS — R000 Tachycardia, unspecified: Secondary | ICD-10-CM | POA: Diagnosis not present

## 2020-01-02 DIAGNOSIS — I10 Essential (primary) hypertension: Secondary | ICD-10-CM | POA: Diagnosis not present

## 2020-01-02 DIAGNOSIS — E782 Mixed hyperlipidemia: Secondary | ICD-10-CM | POA: Insufficient documentation

## 2020-01-02 DIAGNOSIS — R600 Localized edema: Secondary | ICD-10-CM | POA: Diagnosis not present

## 2020-01-02 DIAGNOSIS — Z794 Long term (current) use of insulin: Secondary | ICD-10-CM

## 2020-01-02 HISTORY — DX: Abnormal result of other cardiovascular function study: R94.39

## 2020-01-02 HISTORY — DX: Localized edema: R60.0

## 2020-01-02 HISTORY — DX: Mixed hyperlipidemia: E78.2

## 2020-01-02 LAB — MYOCARDIAL PERFUSION IMAGING
LV dias vol: 61 mL (ref 46–106)
LV sys vol: 23 mL
Peak HR: 106 {beats}/min
Rest HR: 78 {beats}/min
SDS: 5
SRS: 0
SSS: 5
TID: 1.11

## 2020-01-02 MED ORDER — NITROGLYCERIN 0.4 MG SL SUBL
0.4000 mg | SUBLINGUAL_TABLET | SUBLINGUAL | 3 refills | Status: DC | PRN
Start: 2020-01-02 — End: 2021-01-04

## 2020-01-02 MED ORDER — POTASSIUM CHLORIDE CRYS ER 20 MEQ PO TBCR
20.0000 meq | EXTENDED_RELEASE_TABLET | ORAL | 0 refills | Status: DC
Start: 2020-01-03 — End: 2020-02-04

## 2020-01-02 MED ORDER — ASPIRIN EC 81 MG PO TBEC
81.0000 mg | DELAYED_RELEASE_TABLET | Freq: Every day | ORAL | 3 refills | Status: DC
Start: 2020-01-02 — End: 2021-01-29

## 2020-01-02 MED ORDER — FUROSEMIDE 40 MG PO TABS
40.0000 mg | ORAL_TABLET | ORAL | 0 refills | Status: DC
Start: 2020-01-03 — End: 2020-02-04

## 2020-01-02 NOTE — Patient Instructions (Addendum)
Medication Instructions:  Your physician has recommended you make the following change in your medication:  START: Aspirin 81 mg take one tablet by mouth daily START: Nitroglycerin 0.4 mg take one tablet by mouth every 5 minutes as needed for chest pain START: Lasix 40 mg take one tablet by mouth three times weekly. Please take this on Tuesday, Thursday, and Friday for 2 weeks. START: Potassium chloride 20 meq take one tablet by mouth three times weekly. Please take this on Tuesday, Thursday, and Friday for 2 weeks. *If you need a refill on your cardiac medications before your next appointment, please call your pharmacy*   Lab Work: Your physician recommends that you return for lab work in: TODAY BMP, CBC, Mag If you have labs (blood work) drawn today and your tests are completely normal, you will receive your results only by: Marland Kitchen MyChart Message (if you have MyChart) OR . A paper copy in the mail If you have any lab test that is abnormal or we need to change your treatment, we will call you to review the results.   Testing/Procedures:    [facility]OVASCULAR DIVISION [facility] [address] [place] [address] Dept: [phone] Loc: [phone]  Pamela Love  01/02/2020  You are scheduled for a Cardiac Catheterization on Monday, June 7 with Dr. [name].  1. Please arrive at the [facility] (Main Entrance A) at [facility]: [address] [place], [place] [address] at 11:30 AM (This time is two hours before your procedure to ensure your preparation). Free valet parking service is available.   Special note: Every effort is made to have your procedure done on time. Please understand that emergencies sometimes delay scheduled procedures.  2. Diet: Do not eat solid foods after midnight.  The patient may have clear liquids until 5am upon the day of the procedure.  3. Labs: You will need to have blood drawn on  TODAY  4. Medication instructions in preparation for your procedure:   Contrast Allergy: No   Take only 65 units of insulin the night before your procedure. Do not take any insulin on the day of the procedure.   On the morning of your procedure, take your Aspirin and any morning medicines NOT listed above.  You may use sips of water.  5. Plan for one night stay--bring personal belongings. 6. Bring a current list of your medications and current insurance cards. 7. You MUST have a responsible person to drive you home. 8. Someone MUST be with you the first 24 hours after you arrive home or your discharge will be delayed. 9. Please wear clothes that are easy to get on and off and wear slip-on shoes.  Thank you for allowing [place] to care for you!   -- [place] [facility]    Follow-Up: At [facility], you and your health needs are our priority.  As part of our continuing mission to provide you with exceptional heart care, we have created designated Provider Care Teams.  These Care Teams include your primary Cardiologist (physician) and Advanced Practice Providers (APPs -  Physician Assistants and Nurse Practitioners) who all work together to provide you with the care you need, when you need it.  We recommend signing up for the patient portal called "MyChart".  Sign up information is provided on this After Visit Summary.  MyChart is used to connect with patients for Virtual Visits (Telemedicine).  Patients are able to  view lab/test results, encounter notes, upcoming appointments, etc.  Non-urgent messages can be sent to your provider as well.   To learn more about what you can do with MyChart, go to [email or web address].    Your next appointment:   1 month(s)  The format for your next appointment:   In Person  Provider:   Berniece Salines, DO   Other Instructions

## 2020-01-02 NOTE — Progress Notes (Signed)
Cardiology Office Note:    Date:  01/02/2020   ID:  Pamela Love, DOB Apr 22, 1951, MRN [id number]  PCP:  Mayra Neer, MD  Cardiologist:  Berniece Salines, DO  Electrophysiologist:  None   Referring MD: Mayra Neer, MD   " I am nervous about the test result"  History of Present Illness:    Pamela Love is a 69 y.o. female with a hx of  Uncontrolled type 2 diabetes, hypertension, hyperlipidemia,  Chronic kidney disease initially presented to be evaluated for shortness of breath  Initially on Dec 12, 2019.  At that time given her symptoms and risk factor I recommended the patient undergo a nuclear stress test along with the echocardiogram.    She is here today with her husband "Elta Guadeloupe" to discuss the results.   Past Medical History:  Diagnosis Date  . Arthritis   . Bursitis    hips  . C. difficile colitis 12/2017  . Complication of anesthesia    "spinal did not work with second c section"  . Diabetes mellitus ([place]) 11/19/2015  . Diabetes mellitus without complication ([place])    diet controlled - has Rx for Glymipride but has not started taking yet  . Dyslipidemia 10/21/2014  . GERD (gastroesophageal reflux disease)   . History of nonmelanoma skin cancer   . History of skin cancer   . Hypercholesterolemia 12/11/2019  . Hyperlipidemia   . Hypertension   . Hypertensive disorder 10/21/2014  . OAB (overactive bladder)   . Osteoarthritis of right knee 05/15/2015  . Osteoarthrosis, unspecified whether generalized or localized, involving lower leg 10/21/2014  . Osteoporosis   . Primary osteoarthritis of left knee 12/18/2014  . S/P knee replacement 12/18/2014  . S/P total knee replacement using cement 05/15/2015  . Tachycardia 12/11/2019    Past Surgical History:  Procedure Laterality Date  . ABDOMINAL HYSTERECTOMY  2005  . CESAREAN SECTION     x2  . colonscopy     . EYE SURGERY     bilat cataract surgery 2015  . TOTAL KNEE ARTHROPLASTY Left 12/18/2014   Procedure:  TOTAL LEFT KNEE ARTHROPLASTY;  Surgeon: Sydnee Cabal, MD;  Location: [facility];  Service: Orthopedics;  Laterality: Left;  . TOTAL KNEE ARTHROPLASTY Right 05/15/2015   Procedure: RIGHT TOTAL KNEE ARTHROPLASTY;  Surgeon: Sydnee Cabal, MD;  Location: [facility];  Service: Orthopedics;  Laterality: Right;  . UTERINE FIBROID SURGERY      Current Medications: Current Meds  Medication Sig  . Accu-Chek Softclix Lancets lancets 1 each by Other route in the morning and at bedtime. E11.9  . acetaminophen (TYLENOL ARTHRITIS PAIN) 650 MG CR tablet Take 1,300 mg by mouth at bedtime as needed for pain.  . Ascorbic Acid (VITAMIN C) 500 MG CAPS Take 500 mg by mouth daily.  . Blood Glucose Monitoring Suppl (ACCU-CHEK GUIDE ME) w/Device KIT 1 each by Does not apply route 2 (two) times daily. E11.9  . Cholecalciferol (VITAMIN D) 2000 UNITS CAPS Take 2,000 Units by mouth every morning.   . Coenzyme Q10 (COQ10) 200 MG CAPS Take 200 mg by mouth at bedtime.   . cyclobenzaprine (FLEXERIL) 10 MG tablet Take 1 tablet (10 mg total) by mouth 2 (two) times daily as needed for muscle spasms.  . diclofenac sodium (VOLTAREN) 1 % GEL Apply 1 application topically at bedtime as needed (knee pain.). Applied knees  . DULoxetine (CYMBALTA) 20 MG capsule Take 40 mg by mouth at bedtime.   Marland Kitchen glucose blood (ACCU-CHEK GUIDE) test  strip 1 each by Other route 2 (two) times daily. E11.9  . insulin glargine, 2 Unit Dial, (TOUJEO MAX SOLOSTAR) 300 UNIT/ML Solostar Pen Inject 130 Units into the skin every morning. And pen needles 1/day  . Insulin Pen Needle (B-D UF III MINI PEN NEEDLES) 31G X 5 MM MISC 1 each by Other route daily. E11.9  . Multiple Vitamin (MULTIVITAMIN WITH MINERALS) TABS tablet Take 1 tablet by mouth every morning.   . Omega-3 Fatty Acids (RA FISH OIL) 1400 MG CPDR Take 1,400 mg by mouth 2 (two) times daily.  Marland Kitchen omeprazole (PRILOSEC) 20 MG capsule Take 20 mg by mouth 2 (two) times daily.   . rosuvastatin (CRESTOR) 10 MG  tablet Take 10 mg by mouth every morning.   . trospium (SANCTURA) 20 MG tablet Take 20 mg by mouth 2 (two) times daily.  . valsartan-hydrochlorothiazide (DIOVAN-HCT) 160-12.5 MG tablet Take 1 tablet by mouth daily.     Allergies:   Colchicine, Diclofenac, Janumet [sitagliptin-metformin hcl], Septra [sulfamethoxazole-trimethoprim], Trulicity [dulaglutide], Zetia [ezetimibe], Imipramine, and Tape   Social History   Socioeconomic History  . Marital status: Married    Spouse name: Not on file  . Number of children: Not on file  . Years of education: Not on file  . Highest education level: Not on file  Occupational History  . Not on file  Tobacco Use  . Smoking status: Never Smoker  . Smokeless tobacco: Never Used  Substance and Sexual Activity  . Alcohol use: Yes    Comment: rare  . Drug use: No  . Sexual activity: Not on file  Other Topics Concern  . Not on file  Social History Narrative  . Not on file   Social Determinants of Health   Financial Resource Strain:   . Difficulty of Paying Living Expenses:   Food Insecurity:   . Worried About Charity fundraiser in the Last Year:   . Arboriculturist in the Last Year:   Transportation Love:   . Film/video editor (Medical):   Marland Kitchen Lack of Transportation (Non-Medical):   Physical Activity:   . Days of Exercise per Week:   . Minutes of Exercise per Session:   Stress:   . Feeling of Stress :   Social Connections:   . Frequency of Communication with Friends and Family:   . Frequency of Social Gatherings with Friends and Family:   . Attends Religious Services:   . Active Member of Clubs or Organizations:   . Attends Archivist Meetings:   Marland Kitchen Marital Status:      Family History: The patient's family history includes Congenital heart disease in her son; Diabetes in her maternal grandfather; Heart attack in her father, maternal grandfather, paternal uncle, and sister; Heart disease in her mother.  ROS:   Review  of Systems  Constitution: Negative for decreased appetite, fever and weight gain.  HENT: Negative for congestion, ear discharge, hoarse voice and sore throat.   Eyes: Negative for discharge, redness, vision loss in right eye and visual halos.  Cardiovascular: Negative for chest pain, dyspnea on exertion, leg swelling, orthopnea and palpitations.  Respiratory: Negative for cough, hemoptysis, shortness of breath and snoring.   Endocrine: Negative for heat intolerance and polyphagia.  Hematologic/Lymphatic: Negative for bleeding problem. Does not bruise/bleed easily.  Skin: Negative for flushing, nail changes, rash and suspicious lesions.  Musculoskeletal: Negative for arthritis, joint pain, muscle cramps, myalgias, neck pain and stiffness.  Gastrointestinal: Negative for abdominal pain,  bowel incontinence, diarrhea and excessive appetite.  Genitourinary: Negative for decreased libido, genital sores and incomplete emptying.  Neurological: Negative for brief paralysis, focal weakness, headaches and loss of balance.  Psychiatric/Behavioral: Negative for altered mental status, depression and suicidal ideas.  Allergic/Immunologic: Negative for HIV exposure and persistent infections.    EKGs/Labs/Other Studies Reviewed:    The following studies were reviewed today:   EKG:  The ekg ordered today demonstrates     Pharmacologic nuclear stress test   The left ventricular ejection fraction is normal (55-65%).  Nuclear stress EF: 62%.  There was no ST segment deviation noted during stress.  Defect 1: There is a small defect of mild severity present in the basal anterior and mid anterior location.  Findings consistent with ischemia.  This is an intermediate risk study.  Moderate area of mild ischemia involving LAD teritory. Normal LVEF.  Echo 12/25/2019  IMPRESSIONS  1. Left ventricular ejection fraction, by estimation, is 60 to 65%. The  left ventricle has normal function. The left  ventricle has no regional  wall motion abnormalities. Left ventricular diastolic parameters are  consistent with Grade I diastolic  dysfunction (impaired relaxation).  2. Right ventricular systolic function is normal. The right ventricular  size is normal.  3. The mitral valve is normal in structure. No evidence of mitral valve  regurgitation. No evidence of mitral stenosis.  4. The aortic valve is tricuspid. Aortic valve regurgitation is not  visualized. Mild aortic valve sclerosis is present, with no evidence of  aortic valve stenosis.  5. The inferior vena cava is normal in size with greater than 50%  respiratory variability, suggesting right atrial pressure of 3 mmHg.   Recent Labs: No results found for requested labs within last 8760 hours.  Recent Lipid Panel No results found for: CHOL, TRIG, HDL, CHOLHDL, VLDL, LDLCALC, LDLDIRECT  Physical Exam:    VS:  BP 138/74 (BP Location: Right Arm, Patient Position: Sitting, Cuff Size: Normal)   Pulse 98   Ht _0  (1.6 m)   Wt 232 lb 12.8 oz (105.6 kg)   SpO2 97%   BMI 41.24 kg/m     Wt Readings from Last 3 Encounters:  01/02/20 232 lb 12.8 oz (105.6 kg)  12/31/19 232 lb (105.2 kg)  12/12/19 232 lb (105.2 kg)     GEN: Well nourished, well developed in no acute distress HEENT: Normal NECK: No JVD; No carotid bruits LYMPHATICS: No lymphadenopathy CARDIAC: S1S2 noted,RRR, no murmurs, rubs, gallops RESPIRATORY:  Clear to auscultation without rales, wheezing or rhonchi  ABDOMEN: Soft, non-tender, non-distended, +bowel sounds, no guarding. EXTREMITIES: No edema, No cyanosis, no clubbing MUSCULOSKELETAL:  No deformity  SKIN: Warm and dry NEUROLOGIC:  Alert and oriented x 3, non-focal PSYCHIATRIC:  Normal affect, good insight  ASSESSMENT:    1. Abnormal nuclear stress test   2. Essential hypertension   3. Diabetes mellitus due to underlying condition with hyperosmolarity without coma, with long-term current use of insulin  ([place])   4. Bilateral leg edema   5. Mixed hyperlipidemia    PLAN:     Abnormal nuclear stress test showing Moderate area of mild ischemia involving LAD teritory.  The reasonable next step at this time is to pursue a left heart catheterization.  I explained to the patient the patient understands that risks include but are not limited to stroke (1 in 1000), death (1 in 94), kidney failure [usually temporary] (1 in 500), bleeding (1 in 200), allergic reaction [possibly serious] (1  in 200), and agrees to proceed.   I will start on aspirin  81 mg daily today.  Sublingual nitroglycerin prescription was sent, its protocol and 911 protocol explained and the patient vocalized understanding questions were answered to the patient's satisfaction.   she does have bilateral +1 pretibial edema, with mild basilar crackles therefore I am going to get the patient cautious diuretics Lasix 40 mg 3 times a week with potassium supplement for 2 weeks only.   hypertension -her blood pressure is a per be in the office today no changes will be made to this.  The patient is in agreement with the above plan. The patient left the office in stable condition.  The patient will follow up in 1 month or sooner if needed.   Medication Adjustments/Labs and Tests Ordered: Current medicines are reviewed at length with the patient today.  Concerns regarding medicines are outlined above.  Orders Placed This Encounter  Procedures  . CBC  . Basic Metabolic Panel (BMET)  . Magnesium   Meds ordered this encounter  Medications  . aspirin EC 81 MG tablet    Sig: Take 1 tablet (81 mg total) by mouth daily.    Dispense:  90 tablet    Refill:  3  . nitroGLYCERIN (NITROSTAT) 0.4 MG SL tablet    Sig: Place 1 tablet (0.4 mg total) under the tongue every 5 (five) minutes as needed for chest pain.    Dispense:  90 tablet    Refill:  3  . furosemide (LASIX) 40 MG tablet    Sig: Take 1 tablet (40 mg total) by mouth 3 (three) times a  week.    Dispense:  6 tablet    Refill:  0  . potassium chloride SA (KLOR-CON) 20 MEQ tablet    Sig: Take 1 tablet (20 mEq total) by mouth 3 (three) times a week.    Dispense:  6 tablet    Refill:  0    Patient Instructions  Medication Instructions:  Your physician has recommended you make the following change in your medication:  START: Aspirin 81 mg take one tablet by mouth daily START: Nitroglycerin 0.4 mg take one tablet by mouth every 5 minutes as needed for chest pain START: Lasix 40 mg take one tablet by mouth three times weekly. Please take this on Tuesday, Thursday, and Friday for 2 weeks. START: Potassium chloride 20 meq take one tablet by mouth three times weekly. Please take this on Tuesday, Thursday, and Friday for 2 weeks. *If you need a refill on your cardiac medications before your next appointment, please call your pharmacy*   Lab Work: Your physician recommends that you return for lab work in: TODAY BMP, CBC, Mag If you have labs (blood work) drawn today and your tests are completely normal, you will receive your results only by: Marland Kitchen MyChart Message (if you have MyChart) OR . A paper copy in the mail If you have any lab test that is abnormal or we need to change your treatment, we will call you to review the results.   Testing/Procedures:    [place] [address] Dept: [phone] Loc: [phone]  Jeneva Schweizer  01/02/2020  You are scheduled for a Cardiac Catheterization on Monday, June 7 with Dr. Quay Burow.  1. Please arrive at the [facility] (Main Entrance A) at [facility]: [address] [place], [place] [address] at 11:30  AM (This time is two hours before your procedure to ensure your preparation). Free valet parking service is available.   Special note: Every effort is made to have your procedure done on time. Please  understand that emergencies sometimes delay scheduled procedures.  2. Diet: Do not eat solid foods after midnight.  The patient may have clear liquids until 5am upon the day of the procedure.  3. Labs: You will need to have blood drawn on TODAY  4. Medication instructions in preparation for your procedure:   Contrast Allergy: No   Take only 65 units of insulin the night before your procedure. Do not take any insulin on the day of the procedure.   On the morning of your procedure, take your Aspirin and any morning medicines NOT listed above.  You may use sips of water.  5. Plan for one night stay--bring personal belongings. 6. Bring a current list of your medications and current insurance cards. 7. You MUST have a responsible person to drive you home. 8. Someone MUST be with you the first 24 hours after you arrive home or your discharge will be delayed. 9. Please wear clothes that are easy to get on and off and wear slip-on shoes.  Thank you for allowing [place] to care for you!   -- [place] [facility]    Follow-Up: At [facility], you and your health Love are our priority.  As part of our continuing mission to provide you with exceptional heart care, we have created designated Provider Care Teams.  These Care Teams include your primary Cardiologist (physician) and Advanced Practice Providers (APPs -  Physician Assistants and Nurse Practitioners) who all work together to provide you with the care you need, when you need it.  We recommend signing up for the patient portal called "MyChart".  Sign up information is provided on this After Visit Summary.  MyChart is used to connect with patients for Virtual Visits (Telemedicine).  Patients are able to view lab/test results, encounter notes, upcoming appointments, etc.  Non-urgent messages can be sent to your provider as well.   To learn more about what you can do with MyChart, go to [email or web address].    Your  next appointment:   1 month(s)  The format for your next appointment:   In Person  Provider:   Berniece Salines, DO   Other Instructions      Adopting a Healthy Lifestyle.  Know what a healthy weight is for you (roughly BMI <25) and aim to maintain this   Aim for 7+ servings of fruits and vegetables daily   65-80+ fluid ounces of water or unsweet tea for healthy kidneys   Limit to max 1 drink of alcohol per day; avoid smoking/tobacco   Limit animal fats in diet for cholesterol and heart health - choose grass fed whenever available   Avoid highly processed foods, and foods high in saturated/trans fats   Aim for low stress - take time to unwind and care for your mental health   Aim for 150 min of moderate intensity exercise weekly for heart health, and weights twice weekly for bone health   Aim for 7-9 hours of sleep daily   When it comes to diets, agreement about the perfect plan isnt easy to find, even among the experts. Experts at the [employer] developed an idea known as the Healthy Eating Plate. Just imagine a plate divided into logical, healthy portions.   The emphasis is on  diet quality:   Load up on vegetables and fruits - one-half of your plate: Aim for color and variety, and remember that potatoes dont count.   Go for whole grains - one-quarter of your plate: Whole wheat, barley, wheat berries, quinoa, oats, brown rice, and foods made with them. If you want pasta, go with whole wheat pasta.   Protein power - one-quarter of your plate: Fish, chicken, beans, and nuts are all healthy, versatile protein sources. Limit red meat.   The diet, however, does go beyond the plate, offering a few other suggestions.   Use healthy plant oils, such as olive, canola, soy, corn, sunflower and peanut. Check the labels, and avoid partially hydrogenated oil, which have unhealthy trans fats.   If youre thirsty, drink water. Coffee and tea are good in  moderation, but skip sugary drinks and limit milk and dairy products to one or two daily servings.   The type of carbohydrate in the diet is more important than the amount. Some sources of carbohydrates, such as vegetables, fruits, whole grains, and beans-are healthier than others.   Finally, stay active  Signed, Berniece Salines, DO  01/02/2020 6:02 PM    [facility]

## 2020-01-02 NOTE — H&P (View-Only) (Signed)
Cardiology Office Note:    Date:  01/02/2020   ID:  Pamela Love, DOB Apr 22, 1951, MRN [id number]  PCP:  Mayra Neer, MD  Cardiologist:  Berniece Salines, DO  Electrophysiologist:  None   Referring MD: Mayra Neer, MD   " I am nervous about the test result"  History of Present Illness:    Pamela Love is a 69 y.o. female with a hx of  Uncontrolled type 2 diabetes, hypertension, hyperlipidemia,  Chronic kidney disease initially presented to be evaluated for shortness of breath  Initially on Dec 12, 2019.  At that time given her symptoms and risk factor I recommended the patient undergo a nuclear stress test along with the echocardiogram.    She is here today with her husband "Pamela Love" to discuss the results.   Past Medical History:  Diagnosis Date  . Arthritis   . Bursitis    hips  . C. difficile colitis 12/2017  . Complication of anesthesia    "spinal did not work with second c section"  . Diabetes mellitus ([place]) 11/19/2015  . Diabetes mellitus without complication ([place])    diet controlled - has Rx for Glymipride but has not started taking yet  . Dyslipidemia 10/21/2014  . GERD (gastroesophageal reflux disease)   . History of nonmelanoma skin cancer   . History of skin cancer   . Hypercholesterolemia 12/11/2019  . Hyperlipidemia   . Hypertension   . Hypertensive disorder 10/21/2014  . OAB (overactive bladder)   . Osteoarthritis of right knee 05/15/2015  . Osteoarthrosis, unspecified whether generalized or localized, involving lower leg 10/21/2014  . Osteoporosis   . Primary osteoarthritis of left knee 12/18/2014  . S/P knee replacement 12/18/2014  . S/P total knee replacement using cement 05/15/2015  . Tachycardia 12/11/2019    Past Surgical History:  Procedure Laterality Date  . ABDOMINAL HYSTERECTOMY  2005  . CESAREAN SECTION     x2  . colonscopy     . EYE SURGERY     bilat cataract surgery 2015  . TOTAL KNEE ARTHROPLASTY Left 12/18/2014   Procedure:  TOTAL LEFT KNEE ARTHROPLASTY;  Surgeon: Sydnee Cabal, MD;  Location: [facility];  Service: Orthopedics;  Laterality: Left;  . TOTAL KNEE ARTHROPLASTY Right 05/15/2015   Procedure: RIGHT TOTAL KNEE ARTHROPLASTY;  Surgeon: Sydnee Cabal, MD;  Location: [facility];  Service: Orthopedics;  Laterality: Right;  . UTERINE FIBROID SURGERY      Current Medications: Current Meds  Medication Sig  . Accu-Chek Softclix Lancets lancets 1 each by Other route in the morning and at bedtime. E11.9  . acetaminophen (TYLENOL ARTHRITIS PAIN) 650 MG CR tablet Take 1,300 mg by mouth at bedtime as needed for pain.  . Ascorbic Acid (VITAMIN C) 500 MG CAPS Take 500 mg by mouth daily.  . Blood Glucose Monitoring Suppl (ACCU-CHEK GUIDE ME) w/Device KIT 1 each by Does not apply route 2 (two) times daily. E11.9  . Cholecalciferol (VITAMIN D) 2000 UNITS CAPS Take 2,000 Units by mouth every morning.   . Coenzyme Q10 (COQ10) 200 MG CAPS Take 200 mg by mouth at bedtime.   . cyclobenzaprine (FLEXERIL) 10 MG tablet Take 1 tablet (10 mg total) by mouth 2 (two) times daily as needed for muscle spasms.  . diclofenac sodium (VOLTAREN) 1 % GEL Apply 1 application topically at bedtime as needed (knee pain.). Applied knees  . DULoxetine (CYMBALTA) 20 MG capsule Take 40 mg by mouth at bedtime.   Marland Kitchen glucose blood (ACCU-CHEK GUIDE) test  strip 1 each by Other route 2 (two) times daily. E11.9  . insulin glargine, 2 Unit Dial, (TOUJEO MAX SOLOSTAR) 300 UNIT/ML Solostar Pen Inject 130 Units into the skin every morning. And pen needles 1/day  . Insulin Pen Needle (B-D UF III MINI PEN NEEDLES) 31G X 5 MM MISC 1 each by Other route daily. E11.9  . Multiple Vitamin (MULTIVITAMIN WITH MINERALS) TABS tablet Take 1 tablet by mouth every morning.   . Omega-3 Fatty Acids (RA FISH OIL) 1400 MG CPDR Take 1,400 mg by mouth 2 (two) times daily.  Marland Kitchen omeprazole (PRILOSEC) 20 MG capsule Take 20 mg by mouth 2 (two) times daily.   . rosuvastatin (CRESTOR) 10 MG  tablet Take 10 mg by mouth every morning.   . trospium (SANCTURA) 20 MG tablet Take 20 mg by mouth 2 (two) times daily.  . valsartan-hydrochlorothiazide (DIOVAN-HCT) 160-12.5 MG tablet Take 1 tablet by mouth daily.     Allergies:   Colchicine, Diclofenac, Janumet [sitagliptin-metformin hcl], Septra [sulfamethoxazole-trimethoprim], Trulicity [dulaglutide], Zetia [ezetimibe], Imipramine, and Tape   Social History   Socioeconomic History  . Marital status: Married    Spouse name: Not on file  . Number of children: Not on file  . Years of education: Not on file  . Highest education level: Not on file  Occupational History  . Not on file  Tobacco Use  . Smoking status: Never Smoker  . Smokeless tobacco: Never Used  Substance and Sexual Activity  . Alcohol use: Yes    Comment: rare  . Drug use: No  . Sexual activity: Not on file  Other Topics Concern  . Not on file  Social History Narrative  . Not on file   Social Determinants of Health   Financial Resource Strain:   . Difficulty of Paying Living Expenses:   Food Insecurity:   . Worried About Charity fundraiser in the Last Year:   . Arboriculturist in the Last Year:   Transportation Love:   . Film/video editor (Medical):   Marland Kitchen Lack of Transportation (Non-Medical):   Physical Activity:   . Days of Exercise per Week:   . Minutes of Exercise per Session:   Stress:   . Feeling of Stress :   Social Connections:   . Frequency of Communication with Friends and Family:   . Frequency of Social Gatherings with Friends and Family:   . Attends Religious Services:   . Active Member of Clubs or Organizations:   . Attends Archivist Meetings:   Marland Kitchen Marital Status:      Family History: The patient's family history includes Congenital heart disease in her son; Diabetes in her maternal grandfather; Heart attack in her father, maternal grandfather, paternal uncle, and sister; Heart disease in her mother.  ROS:   Review  of Systems  Constitution: Negative for decreased appetite, fever and weight gain.  HENT: Negative for congestion, ear discharge, hoarse voice and sore throat.   Eyes: Negative for discharge, redness, vision loss in right eye and visual halos.  Cardiovascular: Negative for chest pain, dyspnea on exertion, leg swelling, orthopnea and palpitations.  Respiratory: Negative for cough, hemoptysis, shortness of breath and snoring.   Endocrine: Negative for heat intolerance and polyphagia.  Hematologic/Lymphatic: Negative for bleeding problem. Does not bruise/bleed easily.  Skin: Negative for flushing, nail changes, rash and suspicious lesions.  Musculoskeletal: Negative for arthritis, joint pain, muscle cramps, myalgias, neck pain and stiffness.  Gastrointestinal: Negative for abdominal pain,  bowel incontinence, diarrhea and excessive appetite.  Genitourinary: Negative for decreased libido, genital sores and incomplete emptying.  Neurological: Negative for brief paralysis, focal weakness, headaches and loss of balance.  Psychiatric/Behavioral: Negative for altered mental status, depression and suicidal ideas.  Allergic/Immunologic: Negative for HIV exposure and persistent infections.    EKGs/Labs/Other Studies Reviewed:    The following studies were reviewed today:   EKG:  The ekg ordered today demonstrates     Pharmacologic nuclear stress test   The left ventricular ejection fraction is normal (55-65%).  Nuclear stress EF: 62%.  There was no ST segment deviation noted during stress.  Defect 1: There is a small defect of mild severity present in the basal anterior and mid anterior location.  Findings consistent with ischemia.  This is an intermediate risk study.  Moderate area of mild ischemia involving LAD teritory. Normal LVEF.  Echo 12/25/2019  IMPRESSIONS  1. Left ventricular ejection fraction, by estimation, is 60 to 65%. The  left ventricle has normal function. The left  ventricle has no regional  wall motion abnormalities. Left ventricular diastolic parameters are  consistent with Grade I diastolic  dysfunction (impaired relaxation).  2. Right ventricular systolic function is normal. The right ventricular  size is normal.  3. The mitral valve is normal in structure. No evidence of mitral valve  regurgitation. No evidence of mitral stenosis.  4. The aortic valve is tricuspid. Aortic valve regurgitation is not  visualized. Mild aortic valve sclerosis is present, with no evidence of  aortic valve stenosis.  5. The inferior vena cava is normal in size with greater than 50%  respiratory variability, suggesting right atrial pressure of 3 mmHg.   Recent Labs: No results found for requested labs within last 8760 hours.  Recent Lipid Panel No results found for: CHOL, TRIG, HDL, CHOLHDL, VLDL, LDLCALC, LDLDIRECT  Physical Exam:    VS:  BP 138/74 (BP Location: Right Arm, Patient Position: Sitting, Cuff Size: Normal)   Pulse 98   Ht _0  (1.6 m)   Wt 232 lb 12.8 oz (105.6 kg)   SpO2 97%   BMI 41.24 kg/m     Wt Readings from Last 3 Encounters:  01/02/20 232 lb 12.8 oz (105.6 kg)  12/31/19 232 lb (105.2 kg)  12/12/19 232 lb (105.2 kg)     GEN: Well nourished, well developed in no acute distress HEENT: Normal NECK: No JVD; No carotid bruits LYMPHATICS: No lymphadenopathy CARDIAC: S1S2 noted,RRR, no murmurs, rubs, gallops RESPIRATORY:  Clear to auscultation without rales, wheezing or rhonchi  ABDOMEN: Soft, non-tender, non-distended, +bowel sounds, no guarding. EXTREMITIES: No edema, No cyanosis, no clubbing MUSCULOSKELETAL:  No deformity  SKIN: Warm and dry NEUROLOGIC:  Alert and oriented x 3, non-focal PSYCHIATRIC:  Normal affect, good insight  ASSESSMENT:    1. Abnormal nuclear stress test   2. Essential hypertension   3. Diabetes mellitus due to underlying condition with hyperosmolarity without coma, with long-term current use of insulin  ([place])   4. Bilateral leg edema   5. Mixed hyperlipidemia    PLAN:     Abnormal nuclear stress test showing Moderate area of mild ischemia involving LAD teritory.  The reasonable next step at this time is to pursue a left heart catheterization.  I explained to the patient the patient understands that risks include but are not limited to stroke (1 in 1000), death (1 in 94), kidney failure [usually temporary] (1 in 500), bleeding (1 in 200), allergic reaction [possibly serious] (1  in 200), and agrees to proceed.   I will start on aspirin  81 mg daily today.  Sublingual nitroglycerin prescription was sent, its protocol and 911 protocol explained and the patient vocalized understanding questions were answered to the patient's satisfaction.   she does have bilateral +1 pretibial edema, with mild basilar crackles therefore I am going to get the patient cautious diuretics Lasix 40 mg 3 times a week with potassium supplement for 2 weeks only.   hypertension -her blood pressure is a per be in the office today no changes will be made to this.  The patient is in agreement with the above plan. The patient left the office in stable condition.  The patient will follow up in 1 month or sooner if needed.   Medication Adjustments/Labs and Tests Ordered: Current medicines are reviewed at length with the patient today.  Concerns regarding medicines are outlined above.  Orders Placed This Encounter  Procedures  . CBC  . Basic Metabolic Panel (BMET)  . Magnesium   Meds ordered this encounter  Medications  . aspirin EC 81 MG tablet    Sig: Take 1 tablet (81 mg total) by mouth daily.    Dispense:  90 tablet    Refill:  3  . nitroGLYCERIN (NITROSTAT) 0.4 MG SL tablet    Sig: Place 1 tablet (0.4 mg total) under the tongue every 5 (five) minutes as needed for chest pain.    Dispense:  90 tablet    Refill:  3  . furosemide (LASIX) 40 MG tablet    Sig: Take 1 tablet (40 mg total) by mouth 3 (three) times a  week.    Dispense:  6 tablet    Refill:  0  . potassium chloride SA (KLOR-CON) 20 MEQ tablet    Sig: Take 1 tablet (20 mEq total) by mouth 3 (three) times a week.    Dispense:  6 tablet    Refill:  0    Patient Instructions  Medication Instructions:  Your physician has recommended you make the following change in your medication:  START: Aspirin 81 mg take one tablet by mouth daily START: Nitroglycerin 0.4 mg take one tablet by mouth every 5 minutes as needed for chest pain START: Lasix 40 mg take one tablet by mouth three times weekly. Please take this on Tuesday, Thursday, and Friday for 2 weeks. START: Potassium chloride 20 meq take one tablet by mouth three times weekly. Please take this on Tuesday, Thursday, and Friday for 2 weeks. *If you need a refill on your cardiac medications before your next appointment, please call your pharmacy*   Lab Work: Your physician recommends that you return for lab work in: TODAY BMP, CBC, Mag If you have labs (blood work) drawn today and your tests are completely normal, you will receive your results only by: Marland Kitchen MyChart Message (if you have MyChart) OR . A paper copy in the mail If you have any lab test that is abnormal or we need to change your treatment, we will call you to review the results.   Testing/Procedures:    [place] [address] Dept: [phone] Loc: [phone]  Pamela Love  01/02/2020  You are scheduled for a Cardiac Catheterization on Monday, June 7 with Dr. Quay Burow.  1. Please arrive at the [facility] (Main Entrance A) at [facility]: [address] [place], [place] [address] at 11:30  AM (This time is two hours before your procedure to ensure your preparation). Free valet parking service is available.   Special note: Every effort is made to have your procedure done on time. Please  understand that emergencies sometimes delay scheduled procedures.  2. Diet: Do not eat solid foods after midnight.  The patient may have clear liquids until 5am upon the day of the procedure.  3. Labs: You will need to have blood drawn on TODAY  4. Medication instructions in preparation for your procedure:   Contrast Allergy: No   Take only 65 units of insulin the night before your procedure. Do not take any insulin on the day of the procedure.   On the morning of your procedure, take your Aspirin and any morning medicines NOT listed above.  You may use sips of water.  5. Plan for one night stay--bring personal belongings. 6. Bring a current list of your medications and current insurance cards. 7. You MUST have a responsible person to drive you home. 8. Someone MUST be with you the first 24 hours after you arrive home or your discharge will be delayed. 9. Please wear clothes that are easy to get on and off and wear slip-on shoes.  Thank you for allowing [place] to care for you!   -- [facility]    Follow-Up: At [facility], you and your health Love are our priority.  As part of our continuing mission to provide you with exceptional heart care, we have created designated Provider Care Teams.  These Care Teams include your primary Cardiologist (physician) and Advanced Practice Providers (APPs -  Physician Assistants and Nurse Practitioners) who all work together to provide you with the care you need, when you need it.  We recommend signing up for the patient portal called "MyChart".  Sign up information is provided on this After Visit Summary.  MyChart is used to connect with patients for Virtual Visits (Telemedicine).  Patients are able to view lab/test results, encounter notes, upcoming appointments, etc.  Non-urgent messages can be sent to your provider as well.   To learn more about what you can do with MyChart, go to [email or web address].    Your  next appointment:   1 month(s)  The format for your next appointment:   In Person  Provider:   Berniece Salines, DO   Other Instructions      Adopting a Healthy Lifestyle.  Know what a healthy weight is for you (roughly BMI <25) and aim to maintain this   Aim for 7+ servings of fruits and vegetables daily   65-80+ fluid ounces of water or unsweet tea for healthy kidneys   Limit to max 1 drink of alcohol per day; avoid smoking/tobacco   Limit animal fats in diet for cholesterol and heart health - choose grass fed whenever available   Avoid highly processed foods, and foods high in saturated/trans fats   Aim for low stress - take time to unwind and care for your mental health   Aim for 150 min of moderate intensity exercise weekly for heart health, and weights twice weekly for bone health   Aim for 7-9 hours of sleep daily   When it comes to diets, agreement about the perfect plan isnt easy to find, even among the experts. Experts at the [employer] developed an idea known as the Healthy Eating Plate. Just imagine a plate divided into logical, healthy portions.   The emphasis is on  diet quality:   Load up on vegetables and fruits - one-half of your plate: Aim for color and variety, and remember that potatoes dont count.   Go for whole grains - one-quarter of your plate: Whole wheat, barley, wheat berries, quinoa, oats, brown rice, and foods made with them. If you want pasta, go with whole wheat pasta.   Protein power - one-quarter of your plate: Fish, chicken, beans, and nuts are all healthy, versatile protein sources. Limit red meat.   The diet, however, does go beyond the plate, offering a few other suggestions.   Use healthy plant oils, such as olive, canola, soy, corn, sunflower and peanut. Check the labels, and avoid partially hydrogenated oil, which have unhealthy trans fats.   If youre thirsty, drink water. Coffee and tea are good in  moderation, but skip sugary drinks and limit milk and dairy products to one or two daily servings.   The type of carbohydrate in the diet is more important than the amount. Some sources of carbohydrates, such as vegetables, fruits, whole grains, and beans-are healthier than others.   Finally, stay active  Signed, Berniece Salines, DO  01/02/2020 6:02 PM    [facility]

## 2020-01-03 ENCOUNTER — Other Ambulatory Visit (HOSPITAL_COMMUNITY)
Admission: RE | Admit: 2020-01-03 | Discharge: 2020-01-03 | Disposition: A | Discharge status: Home / Self Care | Payer: Medicare PPO | Source: Ambulatory Visit | Attending: Cardiovascular Disease | Admitting: Cardiovascular Disease

## 2020-01-03 ENCOUNTER — Telehealth: Payer: Self-pay | Admitting: Cardiology

## 2020-01-03 ENCOUNTER — Telehealth: Payer: Self-pay

## 2020-01-03 ENCOUNTER — Telehealth: Payer: Self-pay | Admitting: Endocrinology

## 2020-01-03 DIAGNOSIS — Z01812 Encounter for preprocedural laboratory examination: Secondary | ICD-10-CM | POA: Diagnosis not present

## 2020-01-03 DIAGNOSIS — Z20822 Contact with and (suspected) exposure to covid-19: Secondary | ICD-10-CM | POA: Insufficient documentation

## 2020-01-03 LAB — CBC
Hematocrit: 44.7 % (ref 34.0–46.6)
Hemoglobin: 15.3 g/dL (ref 11.1–15.9)
MCH: 30.8 pg (ref 26.6–33.0)
MCHC: 34.2 g/dL (ref 31.5–35.7)
MCV: 90 fL (ref 79–97)
Platelets: 250 10*3/uL (ref 150–450)
RBC: 4.96 x10E6/uL (ref 3.77–5.28)
RDW: 13.2 % (ref 11.7–15.4)
WBC: 8.7 10*3/uL (ref 3.4–10.8)

## 2020-01-03 LAB — BASIC METABOLIC PANEL
BUN/Creatinine Ratio: 22 (ref 12–28)
BUN: 19 mg/dL (ref 8–27)
CO2: 24 mmol/L (ref 20–29)
Calcium: 10.3 mg/dL (ref 8.7–10.3)
Chloride: 99 mmol/L (ref 96–106)
Creatinine, Ser: 0.85 mg/dL (ref 0.57–1.00)
GFR calc Af Amer: 80 mL/min/{1.73_m2} (ref >59)
GFR calc non Af Amer: 69 mL/min/{1.73_m2} (ref >59)
Glucose: 171 mg/dL — ABNORMAL HIGH (ref 65–99)
Potassium: 4 mmol/L (ref 3.5–5.2)
Sodium: 140 mmol/L (ref 134–144)

## 2020-01-03 LAB — MAGNESIUM: Magnesium: 1.6 mg/dL (ref 1.6–2.3)

## 2020-01-03 LAB — SARS CORONAVIRUS 2 (TAT 6-24 HRS): SARS Coronavirus 2: NEGATIVE

## 2020-01-03 NOTE — Telephone Encounter (Signed)
Pt contacted pre-catheterization scheduled at [facility] for: 01/06/20 Verified arrival time and place: [facility] Main Entrance A [facility]) at: 11:30 am    No solid food after midnight prior to cath, clear liquids until 5 AM day of procedure. Contrast allergy: NO  DO NOT TAKE ANY DIURETICS THE MORNING OF THE PROCEDURE. DO NOT TAKE ANY  DIABETES MEDICATIONS THE MORNING OF THE PROCEDURE. Pt reports that she put a call in to her Endocrinologist this morning re: her a.m. insulin.. she says she would feel better knowing what Dr. Everardo All would like her to do.. she will call back if she has any further questions or problems.   AM meds can be  taken pre-cath with sip of water including: ASA 81 mg   Confirmed patient has responsible adult to drive home post procedure and observe 24 hours after arriving home:   You are allowed ONE visitor in the waiting room during your procedure. Both you and your visitor must wear masks.       COVID-19 Pre-Screening Questions:  . In the past 7 to 10 days have you had a cough,  shortness of breath, headache, congestion, fever (100 or greater) body aches, chills, sore throat, or sudden loss of taste or sense of smell? NO . Have you been around anyone with known Covid 19. NO . Have you been around anyone who is awaiting Covid 19 test results in the past 7 to 10 days? NO . Have you been around anyone who has been exposed to Covid 19, or has mentioned symptoms of Covid 19 within the past 7 to 10 days? NO

## 2020-01-03 NOTE — Telephone Encounter (Signed)
Please review message and advise. Thank you.

## 2020-01-03 NOTE — Telephone Encounter (Signed)
Spoke to patient again to verify Dr George Hugh recommendations.   Patient again states that she understands.

## 2020-01-03 NOTE — Telephone Encounter (Signed)
Patient is calling to follow up in regards to medication. She states she has additional questions. Please call.

## 2020-01-03 NOTE — Telephone Encounter (Signed)
New Message    Pt c/o medication issue:  1. Name of Medication: aspirin EC 81 MG tablet  2. How are you currently taking this medication (dosage and times per day)? 1 tablet daily   3. Are you having a reaction (difficulty breathing--STAT)? No   4. What is your medication issue? Pt is wondering if she needs to take the medication in the morning or at night    Please advise

## 2020-01-03 NOTE — Telephone Encounter (Signed)
Spoke to the patient just now. She wanted our advice on when she should take her Lasix and Potassium . I let her know that she could take both of these in the mornings so that she was not up peeing all night due to the Lasix. She verbalizes understanding and does not have any other issues or concerns at this time.    Encouraged patient to call back with any questions or concerns.

## 2020-01-03 NOTE — Telephone Encounter (Signed)
Spoke to the patient just now and let her know that we generally recommend that the patient take this medication once every morning. She verbalizes understanding and is thankful for the call.    Encouraged patient to call back with any questions or concerns.

## 2020-01-03 NOTE — Telephone Encounter (Signed)
Day before, take just 100 units.  Day of, take just 50 units.

## 2020-01-03 NOTE — Telephone Encounter (Signed)
Patient requests to be called at ph# [phone] re: Patient is having an unexpected heart catheterization on 01/06/20 and requests to be advised about taking her medication.

## 2020-01-03 NOTE — Telephone Encounter (Signed)
Patient notified of recommendation from Dr Everardo All.   Patient states that she understands.

## 2020-01-03 NOTE — Telephone Encounter (Signed)
Patient's wife has additional question/concern and is requesting a return call to [phone]

## 2020-01-06 ENCOUNTER — Other Ambulatory Visit: Payer: Self-pay

## 2020-01-06 ENCOUNTER — Ambulatory Visit (HOSPITAL_COMMUNITY)
Admission: RE | Disposition: A | Discharge status: Home / Self Care | Payer: Self-pay | Source: Home / Self Care | Attending: Cardiovascular Disease

## 2020-01-06 ENCOUNTER — Ambulatory Visit (HOSPITAL_COMMUNITY)
Admission: RE | Admit: 2020-01-06 | Discharge: 2020-01-07 | Disposition: A | Discharge status: Home / Self Care | Payer: Medicare PPO | Attending: Cardiovascular Disease | Admitting: Cardiovascular Disease

## 2020-01-06 DIAGNOSIS — Z882 Allergy status to sulfonamides status: Secondary | ICD-10-CM | POA: Diagnosis not present

## 2020-01-06 DIAGNOSIS — I129 Hypertensive chronic kidney disease with stage 1 through stage 4 chronic kidney disease, or unspecified chronic kidney disease: Secondary | ICD-10-CM | POA: Diagnosis not present

## 2020-01-06 DIAGNOSIS — Z888 Allergy status to other drugs, medicaments and biological substances status: Secondary | ICD-10-CM | POA: Insufficient documentation

## 2020-01-06 DIAGNOSIS — M810 Age-related osteoporosis without current pathological fracture: Secondary | ICD-10-CM | POA: Insufficient documentation

## 2020-01-06 DIAGNOSIS — Z955 Presence of coronary angioplasty implant and graft: Secondary | ICD-10-CM

## 2020-01-06 DIAGNOSIS — K219 Gastro-esophageal reflux disease without esophagitis: Secondary | ICD-10-CM | POA: Diagnosis not present

## 2020-01-06 DIAGNOSIS — E782 Mixed hyperlipidemia: Secondary | ICD-10-CM | POA: Diagnosis not present

## 2020-01-06 DIAGNOSIS — E1100 Type 2 diabetes mellitus with hyperosmolarity without nonketotic hyperglycemic-hyperosmolar coma (NKHHC): Secondary | ICD-10-CM | POA: Insufficient documentation

## 2020-01-06 DIAGNOSIS — Z794 Long term (current) use of insulin: Secondary | ICD-10-CM | POA: Insufficient documentation

## 2020-01-06 DIAGNOSIS — N3281 Overactive bladder: Secondary | ICD-10-CM | POA: Diagnosis not present

## 2020-01-06 DIAGNOSIS — I2510 Atherosclerotic heart disease of native coronary artery without angina pectoris: Secondary | ICD-10-CM | POA: Diagnosis not present

## 2020-01-06 DIAGNOSIS — Z79899 Other long term (current) drug therapy: Secondary | ICD-10-CM | POA: Diagnosis not present

## 2020-01-06 DIAGNOSIS — N189 Chronic kidney disease, unspecified: Secondary | ICD-10-CM | POA: Diagnosis not present

## 2020-01-06 DIAGNOSIS — Z881 Allergy status to other antibiotic agents status: Secondary | ICD-10-CM | POA: Diagnosis not present

## 2020-01-06 DIAGNOSIS — E1122 Type 2 diabetes mellitus with diabetic chronic kidney disease: Secondary | ICD-10-CM | POA: Insufficient documentation

## 2020-01-06 DIAGNOSIS — Z7982 Long term (current) use of aspirin: Secondary | ICD-10-CM | POA: Diagnosis not present

## 2020-01-06 DIAGNOSIS — Z96653 Presence of artificial knee joint, bilateral: Secondary | ICD-10-CM | POA: Diagnosis not present

## 2020-01-06 DIAGNOSIS — R9439 Abnormal result of other cardiovascular function study: Secondary | ICD-10-CM | POA: Diagnosis present

## 2020-01-06 DIAGNOSIS — M1990 Unspecified osteoarthritis, unspecified site: Secondary | ICD-10-CM | POA: Diagnosis not present

## 2020-01-06 HISTORY — PX: CORONARY STENT INTERVENTION: CATH118234

## 2020-01-06 HISTORY — PX: LEFT HEART CATH AND CORONARY ANGIOGRAPHY: CATH118249

## 2020-01-06 HISTORY — DX: Atherosclerotic heart disease of native coronary artery without angina pectoris: I25.10

## 2020-01-06 HISTORY — PX: CORONARY PRESSURE/FFR STUDY: CATH118243

## 2020-01-06 LAB — GLUCOSE, CAPILLARY
Glucose-Capillary: 193 mg/dL — ABNORMAL HIGH (ref 70–99)
Glucose-Capillary: 102 mg/dL — ABNORMAL HIGH (ref 70–99)
Glucose-Capillary: 218 mg/dL — ABNORMAL HIGH (ref 70–99)

## 2020-01-06 SURGERY — LEFT HEART CATH AND CORONARY ANGIOGRAPHY
Anesthesia: LOCAL

## 2020-01-06 MED ORDER — MORPHINE SULFATE (PF) 2 MG/ML IV SOLN: 2.0000 mg | INTRAVENOUS | Status: DC | PRN

## 2020-01-06 MED ORDER — SODIUM CHLORIDE 0.9 % IV SOLN: 250.0000 mL | INTRAVENOUS | Status: DC | PRN

## 2020-01-06 MED ORDER — FAMOTIDINE IN NACL 20-0.9 MG/50ML-% IV SOLN
INTRAVENOUS | Status: AC
  Filled 2020-01-06: qty 50

## 2020-01-06 MED ORDER — HEPARIN (PORCINE) IN NACL 1000-0.9 UT/500ML-% IV SOLN
INTRAVENOUS | Status: AC
  Filled 2020-01-06: qty 500

## 2020-01-06 MED ORDER — FUROSEMIDE 40 MG PO TABS
40.0000 mg | ORAL_TABLET | ORAL | Status: DC
  Administered 2020-01-06: 40 mg via ORAL
  Filled 2020-01-06: qty 1

## 2020-01-06 MED ORDER — HEPARIN SODIUM (PORCINE) 1000 UNIT/ML IJ SOLN
INTRAMUSCULAR | Status: AC
  Filled 2020-01-06: qty 1

## 2020-01-06 MED ORDER — ACETAMINOPHEN 325 MG PO TABS
650.0000 mg | ORAL_TABLET | ORAL | Status: DC | PRN
  Administered 2020-01-06: 650 mg via ORAL
  Filled 2020-01-06: qty 2

## 2020-01-06 MED ORDER — HYDROCHLOROTHIAZIDE 12.5 MG PO CAPS
12.5000 mg | ORAL_CAPSULE | Freq: Every day | ORAL | Status: DC
  Administered 2020-01-07: 12.5 mg via ORAL
  Filled 2020-01-06: qty 1

## 2020-01-06 MED ORDER — ASPIRIN 81 MG PO CHEW
81.0000 mg | CHEWABLE_TABLET | Freq: Every day | ORAL | Status: DC
  Administered 2020-01-07: 81 mg via ORAL
  Filled 2020-01-06: qty 1

## 2020-01-06 MED ORDER — ONDANSETRON HCL 4 MG/2ML IJ SOLN
INTRAMUSCULAR | Status: AC
  Filled 2020-01-06: qty 2

## 2020-01-06 MED ORDER — HYDRALAZINE HCL 20 MG/ML IJ SOLN: 10.0000 mg | INTRAMUSCULAR | Status: AC | PRN

## 2020-01-06 MED ORDER — VERAPAMIL HCL 2.5 MG/ML IV SOLN
INTRAVENOUS | Status: AC
  Filled 2020-01-06: qty 2

## 2020-01-06 MED ORDER — SODIUM CHLORIDE 0.9% FLUSH: 3.0000 mL | INTRAVENOUS | Status: DC | PRN

## 2020-01-06 MED ORDER — VERAPAMIL HCL 2.5 MG/ML IV SOLN
INTRA_ARTERIAL | Status: DC | PRN
  Administered 2020-01-06: 5 mL via INTRA_ARTERIAL
  Administered 2020-01-06: 7.5 mL via INTRA_ARTERIAL

## 2020-01-06 MED ORDER — IRBESARTAN 150 MG PO TABS
150.0000 mg | ORAL_TABLET | Freq: Every day | ORAL | Status: DC
  Administered 2020-01-07: 150 mg via ORAL
  Filled 2020-01-06: qty 1

## 2020-01-06 MED ORDER — LABETALOL HCL 5 MG/ML IV SOLN: 10.0000 mg | INTRAVENOUS | Status: AC | PRN

## 2020-01-06 MED ORDER — FENTANYL CITRATE (PF) 100 MCG/2ML IJ SOLN
INTRAMUSCULAR | Status: DC | PRN
  Administered 2020-01-06: 25 ug via INTRAVENOUS

## 2020-01-06 MED ORDER — DULOXETINE HCL 20 MG PO CPEP
40.0000 mg | ORAL_CAPSULE | Freq: Every day | ORAL | Status: DC
  Administered 2020-01-06: 40 mg via ORAL
  Filled 2020-01-06: qty 2

## 2020-01-06 MED ORDER — FENTANYL CITRATE (PF) 100 MCG/2ML IJ SOLN
INTRAMUSCULAR | Status: AC
  Filled 2020-01-06: qty 2

## 2020-01-06 MED ORDER — HEPARIN SODIUM (PORCINE) 1000 UNIT/ML IJ SOLN
INTRAMUSCULAR | Status: DC | PRN
  Administered 2020-01-06 (×2): 5000 [IU] via INTRAVENOUS
  Administered 2020-01-06 (×2): 3000 [IU] via INTRAVENOUS

## 2020-01-06 MED ORDER — ASPIRIN EC 81 MG PO TBEC: 81.0000 mg | DELAYED_RELEASE_TABLET | Freq: Every day | ORAL | Status: DC

## 2020-01-06 MED ORDER — ROSUVASTATIN CALCIUM 5 MG PO TABS
10.0000 mg | ORAL_TABLET | Freq: Every morning | ORAL | Status: DC
  Administered 2020-01-07: 10 mg via ORAL
  Filled 2020-01-06: qty 2

## 2020-01-06 MED ORDER — SODIUM CHLORIDE 0.9% FLUSH: 3.0000 mL | Freq: Two times a day (BID) | INTRAVENOUS | Status: DC

## 2020-01-06 MED ORDER — CLOPIDOGREL BISULFATE 300 MG PO TABS
ORAL_TABLET | ORAL | Status: DC | PRN
  Administered 2020-01-06: 600 mg via ORAL

## 2020-01-06 MED ORDER — NITROGLYCERIN 0.4 MG SL SUBL: 0.4000 mg | SUBLINGUAL_TABLET | SUBLINGUAL | Status: DC | PRN

## 2020-01-06 MED ORDER — INSULIN GLARGINE 100 UNIT/ML ~~LOC~~ SOLN
130.0000 [IU] | Freq: Every day | SUBCUTANEOUS | Status: DC
  Administered 2020-01-07: 130 [IU] via SUBCUTANEOUS
  Filled 2020-01-06: qty 1.3

## 2020-01-06 MED ORDER — VALSARTAN-HYDROCHLOROTHIAZIDE 160-12.5 MG PO TABS: 1.0000 | ORAL_TABLET | Freq: Every day | ORAL | Status: DC

## 2020-01-06 MED ORDER — SODIUM CHLORIDE 0.9 % WEIGHT BASED INFUSION
1.0000 mL/kg/h | INTRAVENOUS | Status: DC
  Administered 2020-01-06: 1 mL/kg/h via INTRAVENOUS

## 2020-01-06 MED ORDER — NITROGLYCERIN 1 MG/10 ML FOR IR/CATH LAB
INTRA_ARTERIAL | Status: AC
  Filled 2020-01-06: qty 10

## 2020-01-06 MED ORDER — SODIUM CHLORIDE 0.9 % IV SOLN: INTRAVENOUS | Status: AC

## 2020-01-06 MED ORDER — ONDANSETRON HCL 4 MG/2ML IJ SOLN: 4.0000 mg | Freq: Four times a day (QID) | INTRAMUSCULAR | Status: DC | PRN

## 2020-01-06 MED ORDER — MIDAZOLAM HCL 2 MG/2ML IJ SOLN
INTRAMUSCULAR | Status: DC | PRN
  Administered 2020-01-06: 1 mg via INTRAVENOUS

## 2020-01-06 MED ORDER — ONDANSETRON HCL 4 MG/2ML IJ SOLN
INTRAMUSCULAR | Status: DC | PRN
  Administered 2020-01-06: 4 mg via INTRAVENOUS

## 2020-01-06 MED ORDER — CLOPIDOGREL BISULFATE 75 MG PO TABS
75.0000 mg | ORAL_TABLET | Freq: Every day | ORAL | Status: DC
  Administered 2020-01-07: 75 mg via ORAL
  Filled 2020-01-06: qty 1

## 2020-01-06 MED ORDER — LIDOCAINE HCL (PF) 1 % IJ SOLN
INTRAMUSCULAR | Status: AC
  Filled 2020-01-06: qty 30

## 2020-01-06 MED ORDER — SODIUM CHLORIDE 0.9 % WEIGHT BASED INFUSION
3.0000 mL/kg/h | INTRAVENOUS | Status: DC
  Administered 2020-01-06: 3 mL/kg/h via INTRAVENOUS

## 2020-01-06 MED ORDER — FAMOTIDINE IN NACL 20-0.9 MG/50ML-% IV SOLN
INTRAVENOUS | Status: AC | PRN
  Administered 2020-01-06: 20 mg via INTRAVENOUS

## 2020-01-06 MED ORDER — LIDOCAINE HCL (PF) 1 % IJ SOLN
INTRAMUSCULAR | Status: DC | PRN
  Administered 2020-01-06: 2 mL via SUBCUTANEOUS

## 2020-01-06 MED ORDER — CLOPIDOGREL BISULFATE 300 MG PO TABS
ORAL_TABLET | ORAL | Status: AC
  Filled 2020-01-06: qty 2

## 2020-01-06 MED ORDER — MIDAZOLAM HCL 2 MG/2ML IJ SOLN
INTRAMUSCULAR | Status: AC
  Filled 2020-01-06: qty 2

## 2020-01-06 SURGICAL SUPPLY — 22 items
BALLN SAPPHIRE 2.0X12 (BALLOONS) ×2
BALLN SAPPHIRE ~~LOC~~ 2.5X15 (BALLOONS) ×1 IMPLANT
BALLOON SAPPHIRE 2.0X12 (BALLOONS) IMPLANT
CATH INFINITI JR4 5F (CATHETERS) ×1 IMPLANT
CATH OPTITORQUE TIG 4.0 5F (CATHETERS) ×1 IMPLANT
CATH VISTA GUIDE 6FR XBLAD3.0 (CATHETERS) ×1 IMPLANT
CATH VISTA GUIDE 6FR XBRCA (CATHETERS) ×1 IMPLANT
DEVICE RAD COMP TR BAND LRG (VASCULAR PRODUCTS) ×1 IMPLANT
GLIDESHEATH SLEND A-KIT 6F 22G (SHEATH) ×1 IMPLANT
GUIDEWIRE INQWIRE 1.5J.035X260 (WIRE) IMPLANT
GUIDEWIRE PRESSURE COMET II (WIRE) ×2 IMPLANT
INQWIRE 1.5J .035X260CM (WIRE) ×2
KIT ENCORE 26 ADVANTAGE (KITS) ×1 IMPLANT
KIT ESSENTIALS PG (KITS) ×1 IMPLANT
KIT HEART LEFT (KITS) ×2 IMPLANT
PACK CARDIAC CATHETERIZATION (CUSTOM PROCEDURE TRAY) ×2 IMPLANT
STENT RESOLUTE ONYX 2.25X12 (Permanent Stent) ×1 IMPLANT
STENT RESOLUTE ONYX 2.25X26 (Permanent Stent) ×1 IMPLANT
TRANSDUCER W/STOPCOCK (MISCELLANEOUS) ×2 IMPLANT
TUBING CIL FLEX 10 FLL-RA (TUBING) ×2 IMPLANT
WIRE ASAHI PROWATER 180CM (WIRE) ×1 IMPLANT
WIRE HI TORQ VERSACORE-J 145CM (WIRE) ×1 IMPLANT

## 2020-01-06 NOTE — Interval H&P Note (Signed)
Cath Lab Visit (complete for each Cath Lab visit)  Clinical Evaluation Leading to the Procedure:   ACS: No.  Non-ACS:    Anginal Classification: CCS II  Anti-ischemic medical therapy: Minimal Therapy (1 class of medications)  Non-Invasive Test Results: Intermediate-risk stress test findings: cardiac mortality 1-3%/year  Prior CABG: No previous CABG      History and Physical Interval Note:  01/06/2020 3:59 PM  Pamela Love  has presented today for surgery, with the diagnosis of CAD.  The various methods of treatment have been discussed with the patient and family. After consideration of risks, benefits and other options for treatment, the patient has consented to  Procedure(s): LEFT HEART CATH AND CORONARY ANGIOGRAPHY (N/A) as a surgical intervention.  The patient's history has been reviewed, patient examined, no change in status, stable for surgery.  I have reviewed the patient's chart and labs.  Questions were answered to the patient's satisfaction.     Pamela Love

## 2020-01-07 DIAGNOSIS — I129 Hypertensive chronic kidney disease with stage 1 through stage 4 chronic kidney disease, or unspecified chronic kidney disease: Secondary | ICD-10-CM | POA: Diagnosis not present

## 2020-01-07 DIAGNOSIS — I2510 Atherosclerotic heart disease of native coronary artery without angina pectoris: Secondary | ICD-10-CM | POA: Diagnosis not present

## 2020-01-07 DIAGNOSIS — R9439 Abnormal result of other cardiovascular function study: Secondary | ICD-10-CM

## 2020-01-07 DIAGNOSIS — K219 Gastro-esophageal reflux disease without esophagitis: Secondary | ICD-10-CM | POA: Diagnosis not present

## 2020-01-07 DIAGNOSIS — N189 Chronic kidney disease, unspecified: Secondary | ICD-10-CM | POA: Diagnosis not present

## 2020-01-07 DIAGNOSIS — E1100 Type 2 diabetes mellitus with hyperosmolarity without nonketotic hyperglycemic-hyperosmolar coma (NKHHC): Secondary | ICD-10-CM | POA: Diagnosis not present

## 2020-01-07 DIAGNOSIS — N3281 Overactive bladder: Secondary | ICD-10-CM | POA: Diagnosis not present

## 2020-01-07 DIAGNOSIS — E782 Mixed hyperlipidemia: Secondary | ICD-10-CM | POA: Diagnosis not present

## 2020-01-07 DIAGNOSIS — E1122 Type 2 diabetes mellitus with diabetic chronic kidney disease: Secondary | ICD-10-CM | POA: Diagnosis not present

## 2020-01-07 LAB — CBC
HCT: 39.1 % (ref 36.0–46.0)
Hemoglobin: 13.2 g/dL (ref 12.0–15.0)
MCH: 30.5 pg (ref 26.0–34.0)
MCHC: 33.8 g/dL (ref 30.0–36.0)
MCV: 90.3 fL (ref 80.0–100.0)
Platelets: 225 10*3/uL (ref 150–400)
RBC: 4.33 MIL/uL (ref 3.87–5.11)
RDW: 12.7 % (ref 11.5–15.5)
WBC: 7.1 10*3/uL (ref 4.0–10.5)
nRBC: 0 % (ref 0.0–0.2)

## 2020-01-07 LAB — BASIC METABOLIC PANEL
Anion gap: 9 (ref 5–15)
BUN: 13 mg/dL (ref 8–23)
CO2: 29 mmol/L (ref 22–32)
Calcium: 8.9 mg/dL (ref 8.9–10.3)
Chloride: 102 mmol/L (ref 98–111)
Creatinine, Ser: 0.94 mg/dL (ref 0.44–1.00)
GFR calc Af Amer: >60 mL/min (ref >60)
GFR calc non Af Amer: >60 mL/min (ref >60)
Glucose, Bld: 120 mg/dL — ABNORMAL HIGH (ref 70–99)
Potassium: 3.9 mmol/L (ref 3.5–5.1)
Sodium: 140 mmol/L (ref 135–145)

## 2020-01-07 LAB — POCT ACTIVATED CLOTTING TIME
Activated Clotting Time: 252 seconds
Activated Clotting Time: 268 seconds
Activated Clotting Time: 290 seconds

## 2020-01-07 LAB — GLUCOSE, CAPILLARY: Glucose-Capillary: 105 mg/dL — ABNORMAL HIGH (ref 70–99)

## 2020-01-07 MED ORDER — CLOPIDOGREL BISULFATE 75 MG PO TABS: 75.0000 mg | ORAL_TABLET | Freq: Every day | ORAL | 11 refills | Status: DC

## 2020-01-07 MED ORDER — ROSUVASTATIN CALCIUM 20 MG PO TABS: 40.0000 mg | ORAL_TABLET | Freq: Every morning | ORAL | Status: DC

## 2020-01-07 MED ORDER — CARVEDILOL 3.125 MG PO TABS
3.1250 mg | ORAL_TABLET | Freq: Two times a day (BID) | ORAL | Status: DC
  Administered 2020-01-07: 3.125 mg via ORAL
  Filled 2020-01-07: qty 1

## 2020-01-07 MED ORDER — PANTOPRAZOLE SODIUM 40 MG PO TBEC
40.0000 mg | DELAYED_RELEASE_TABLET | Freq: Every day | ORAL | 11 refills | Status: DC
Start: 2020-01-07 — End: 2020-03-17

## 2020-01-07 MED ORDER — ROSUVASTATIN CALCIUM 40 MG PO TABS: 40.0000 mg | ORAL_TABLET | Freq: Every morning | ORAL | 11 refills | Status: DC

## 2020-01-07 MED ORDER — CARVEDILOL 3.125 MG PO TABS: 3.1250 mg | ORAL_TABLET | Freq: Two times a day (BID) | ORAL | 11 refills | Status: DC

## 2020-01-07 MED FILL — Heparin Sod (Porcine)-NaCl IV Soln 1000 Unit/500ML-0.9%: INTRAVENOUS | Qty: 1000 | Status: AC

## 2020-01-07 NOTE — Discharge Summary (Addendum)
Discharge Summary    Love ID: Pamela Love MRN: [id number]; DOB: 03/01/1951  Admit date: 01/06/2020 Discharge date: 01/07/2020  Primary Care Provider: Mayra Neer, MD  Primary Cardiologist: Pamela Salines, DO  Discharge Diagnoses    Active Problems:   Abnormal nuclear stress test   CAD (coronary artery disease) Hypertension Hyperlipidemia Diabetes mellitus   Diagnostic Studies/Procedures    CORONARY STENT INTERVENTION  01/06/2020  INTRAVASCULAR PRESSURE WIRE/FFR STUDY  LEFT HEART CATH AND CORONARY ANGIOGRAPHY  Conclusion    Prox LAD to Mid LAD lesion is 60% stenosed.  Prox RCA to Mid RCA lesion is 90% stenosed.  A drug-eluting stent was successfully placed using a [place] [id number].  Post intervention, there is a 0% residual stenosis.   IMPRESSION: Pamela Love had a negative DFR of a moderate mid LAD lesion after Pamela first diagonal branch and high-grade dominant RCA stenosis which was stented successfully with 2 overlapping resolute Onyx drug-eluting stents.  We will keep her overnight, discharged home in Pamela morning on dual antiplatelet therapy will which she will remain on uninterrupted for 12 months.  She will follow up with Dr. Harriet Love in Pamela [facility] office.   Diagnostic Dominance: Right  Intervention     History of Present Illness     Pamela Love is a 69 y.o. female with history of hypertension, hyperlipidemia, uncontrolled diabetes mellitus and chronic kidney disease presented for outpatient cardiac catheterization.  Establish care with Dr. Harriet Love recently for evaluation of shortness of breath.  Given cardiac risk factor echocardiogram and stress test recommended.  Stress test was intermediate risk concerning for ischemia in LAD territory.  Echocardiogram with preserved LV function and grade 1 diastolic dysfunction.  Catheterization recommended for further evaluation.  Hospital Course     Consultants: None  Cath showed  high-grade dominant RCA stenosis s/p successful 2 overlapping resolute Onyx DES.  There was moderate mid LAD stenosis which was negative for DFR.  Recommended dual antiplatelet platelet therapy uninterrupted for 12 months with aspirin and Plavix.  Renal function stable overnight.  Ambulated well in Pamela morning.  Added beta-blocker given CAD.  Increase Crestor to 38m qd.  Did Pamela Love have an acute coronary syndrome (MI, NSTEMI, STEMI, etc) this admission?:  No                               Did Pamela Love have a percutaneous coronary intervention (stent / angioplasty)?:  Yes.     Cath/PCI Registry Performance & Quality Measures: 1. Aspirin prescribed? - Yes 2. ADP Receptor Inhibitor (Plavix/Clopidogrel, Brilinta/Ticagrelor or Effient/Prasugrel) prescribed (includes medically managed patients)? - Yes 3. High Intensity Statin (Lipitor 40-852mor Crestor 20-4078mprescribed? - Yes 4. For EF <40%, was ACEI/ARB prescribed? - Yes 5. For EF <40%, Aldosterone Antagonist (Spironolactone or Eplerenone) prescribed? - Not Applicable (EF >/= 40%25%. Cardiac Rehab Phase II ordered? - Yes   Discharge Vitals Blood pressure 139/78, pulse 86, temperature 98.1 F (36.7 C), temperature source Oral, resp. rate 20, height '5\' 3"'  (1.6 m), weight 106.4 kg, SpO2 96 %.  Filed Weights   01/06/20 1158 01/06/20 2027 01/07/20 0421  Weight: 105.2 kg 106.4 kg 106.4 kg   Physical Exam  Constitutional: She is oriented to person, place, and time and well-developed, well-nourished, and in no distress.  HENT:  Head: Normocephalic and atraumatic.  Eyes: Pupils are equal, round, and reactive to light. Conjunctivae and EOM are normal.  Cardiovascular:  Right radial cath site without hematoma  Pulmonary/Chest: Effort normal and breath sounds normal.  Abdominal: Soft. Bowel sounds are normal.  Musculoskeletal:        General: Normal range of motion.     Cervical back: Normal range of motion and neck supple.  Neurological:  She is alert and oriented to person, place, and time.  Skin: Skin is warm and dry.  Psychiatric: Affect normal.    Labs & Radiologic Studies    CBC Recent Labs    01/07/20 0443  WBC 7.1  HGB 13.2  HCT 39.1  MCV 90.3  PLT 403   Basic Metabolic Panel Recent Labs    01/07/20 0443  NA 140  K 3.9  CL 102  CO2 29  GLUCOSE 120*  BUN 13  CREATININE 0.94  CALCIUM 8.9  _____________  CARDIAC CATHETERIZATION  Result Date: 01/06/2020  Prox LAD to Mid LAD lesion is 60% stenosed.  Prox RCA to Mid RCA lesion is 90% stenosed.  A drug-eluting stent was successfully placed using a [place] [id number].  Post intervention, there is a 0% residual stenosis.  Pamela Love is a 69 y.o. female  [id number] LOCATION:  FACILITY: [place] PHYSICIAN: Pamela Love, M.D. 09/18/1950 DATE OF PROCEDURE:  01/06/2020 DATE OF DISCHARGE: CARDIAC CATHETERIZATION / PCI DES RCA History obtained from chart review.  Pamela Love is a 69 year old mildly overweight married Caucasian female history of hypertension, diabetes and hyperlipidemia.  She was evaluated by Dr. Harriet Love because of chest pain and shortness of breath.  Her 2D echo showed normal LV function.  Myoview stress test suggested anterior ischemia.  Because of this she was referred for diagnostic coronary angiography. PROCEDURE DESCRIPTION: Pamela Love was brought to Pamela second floor [place] [facility] cath lab in Pamela postabsorptive state.  She was premedicated with IV Versed and fentanyl.  Her right wristwas prepped and shaved in usual sterile fashion. Xylocaine 1% was used for local anesthesia. A 6 French sheath was inserted into Pamela right radial artery using standard Seldinger technique. Pamela Love received 5000 units  of heparin intravenously.  A 5 [place] TIG catheter was used for selective coronary angiography and obtain left heart pressures.  Isovue dye was used for Pamela entirety of Pamela case.  Retroaortic, ventricular and pullback pressures were recorded.   Radial cocktail was administered via Pamela SideArm sheath. Pamela Love received a total of 16,000 units of heparin with an ACT of 290.  Total contrast measured Pamela Love was 170 cc.  Isovue dye was used for Pamela entirety of Pamela intervention.  Retrograde aortic pressures monitored in Pamela case.  Love received 600 mg of p.o. Plavix followed by Pepcid 20 mg IV. I initially performed DFR on Pamela mid LAD using a 6 [place] XB LAD 3 cm guide catheter along with a comet DFR wire.  Pamela DFR was 0.94 suggesting that Pamela lesion was not physiologically significant. Following this I focused my attention on Pamela RCA.  There was upward takeoff and therefore I used a 6 [place] extra-support right guide along with an 014 [place] guidewire.  I predilated Pamela tightest part of Pamela lesion in Pamela mid vessel with a 2 mm x 12 mm balloon and placed a 2.25 mm x 26 mm resolute Onyx drug-eluting stent across Pamela majority lesion deploying at 14 to 16 atm.  I then placed a 2.25 x 12 mm long resolute Onyx in Pamela proximal portion overlapping Pamela previous stent and postdilated Pamela entire stented segment with a  2.5 x 15 mm long noncompliant balloon at 16 atm (2.65 mm) resulting reduction of a 90% focal mid with long segmental 70% proximal mid RCA stenosis to 0% residual.  Love tolerated procedure well.  Pamela guidewire and catheter were removed.  Pamela sheath was removed and a TR band was placed in Pamela right wrist to achieve patent hemostasis.  Pamela Love left lab in stable condition.   Pamela Love had a negative DFR of a moderate mid LAD lesion after Pamela first diagonal branch and high-grade dominant RCA stenosis which was stented successfully with 2 overlapping resolute Onyx drug-eluting stents.  We will keep her overnight, discharged home in Pamela morning on dual antiplatelet therapy will which she will remain on uninterrupted for 12 months.  She will follow up with Dr. Harriet Love in Pam[place] [facility] office. Pamela Love. MD, [facility] 01/06/2020 5:51 PM    MYOCARDIAL PERFUSION IMAGING  Result Date: 01/02/2020  Pamela left ventricular ejection fraction is normal (55-65%).  Nuclear stress EF: 62%.  There was no ST segment deviation noted during stress.  Defect 1: There is a small defect of mild severity present in Pamela basal anterior and mid anterior location.  Findings consistent with ischemia.  This is an intermediate risk study.  Moderate area of mild ischemia involving LAD teritory.  Normal LVEF.    ECHOCARDIOGRAM COMPLETE  Result Date: 12/25/2019    ECHOCARDIOGRAM REPORT   Love Name:   Pamela Love Date of Exam: 12/25/2019 Medical Rec #:  [id number]             Height:       63.0 in Accession #:    [id number]            Weight:       232.0 lb Date of Birth:  January 23, 1951              BSA:          2.060 m Love Age:    48 years              BP:           149/87 mmHg Love Gender: F                     HR:           91 bpm. Exam Location:  [place] Procedure: 2D Echo Indications:    shortness of breath  History:        Love has no prior history of Echocardiogram examinations.                 Signs/Symptoms:Shortness of Breath; Risk Factors:Dyslipidemia,                 Diabetes and Hypertension.  Sonographer:    Johny Chess RDCS Referring Phys: [id number] [place]  1. Left ventricular ejection fraction, by estimation, is 60 to 65%. Pamela left ventricle has normal function. Pamela left ventricle has no regional wall motion abnormalities. Left ventricular diastolic parameters are consistent with Grade I diastolic dysfunction (impaired relaxation).  2. Right ventricular systolic function is normal. Pamela right ventricular size is normal.  3. Pamela mitral valve is normal in structure. No evidence of mitral valve regurgitation. No evidence of mitral stenosis.  4. Pamela aortic valve is tricuspid. Aortic valve regurgitation is not visualized. Mild aortic valve sclerosis is present, with no evidence of aortic valve stenosis.  5. Pamela  inferior vena cava is normal in size  with greater than 50% respiratory variability, suggesting right atrial pressure of 3 mmHg. FINDINGS  Left Ventricle: Left ventricular ejection fraction, by estimation, is 60 to 65%. Pamela left ventricle has normal function. Pamela left ventricle has no regional wall motion abnormalities. Pamela left ventricular internal cavity size was normal in size. There is  no left ventricular hypertrophy. Left ventricular diastolic parameters are consistent with Grade I diastolic dysfunction (impaired relaxation). Normal left ventricular filling pressure. Right Ventricle: Pamela right ventricular size is normal. No increase in right ventricular wall thickness. Right ventricular systolic function is normal. Left Atrium: Left atrial size was normal in size. Right Atrium: Right atrial size was normal in size. Pericardium: There is no evidence of pericardial effusion. Mitral Valve: Pamela mitral valve is normal in structure. Normal mobility of Pamela mitral valve leaflets. No evidence of mitral valve regurgitation. No evidence of mitral valve stenosis. Tricuspid Valve: Pamela tricuspid valve is normal in structure. Tricuspid valve regurgitation is not demonstrated. No evidence of tricuspid stenosis. Aortic Valve: Pamela aortic valve is tricuspid. Aortic valve regurgitation is not visualized. Mild aortic valve sclerosis is present, with no evidence of aortic valve stenosis. Pulmonic Valve: Pamela pulmonic valve was not well visualized. Pulmonic valve regurgitation is not visualized. No evidence of pulmonic stenosis. Aorta: Pamela aortic root, ascending aorta and aortic arch are all structurally normal, with no evidence of dilitation or obstruction. Venous: A normal flow pattern is recorded from Pamela right upper pulmonary vein. Pamela inferior vena cava is normal in size with greater than 50% respiratory variability, suggesting right atrial pressure of 3 mmHg. IAS/Shunts: No atrial level shunt detected by color flow Doppler.   LEFT VENTRICLE PLAX 2D LVIDd:         3.93 cm  Diastology LVIDs:         2.64 cm  LV e' lateral:   8.81 cm/s LV PW:         0.87 cm  LV E/e' lateral: 9.0 LV IVS:        0.89 cm  LV e' medial:    7.18 cm/s LVOT diam:     1.90 cm  LV E/e' medial:  11.0 LV SV:         63 LV SV Index:   30 LVOT Area:     2.84 cm  RIGHT VENTRICLE             IVC RV S prime:     17.20 cm/s  IVC diam: 2.06 cm TAPSE (M-mode): 2.0 cm LEFT ATRIUM             Index       RIGHT ATRIUM           Index LA diam:        3.90 cm 1.89 cm/m  RA Area:     13.10 cm LA Vol (A2C):   37.1 ml 18.01 ml/m RA Volume:   27.20 ml  13.21 ml/m LA Vol (A4C):   40.2 ml 19.52 ml/m LA Biplane Vol: 39.5 ml 19.18 ml/m  AORTIC VALVE LVOT Vmax:   116.00 cm/s LVOT Vmean:  67.900 cm/s LVOT VTI:    0.221 m MITRAL VALVE MV Area (PHT): 3.31 cm     SHUNTS MV Decel Time: 229 msec     Systemic VTI:  0.22 m MV E velocity: 79.10 cm/s   Systemic Diam: 1.90 cm MV A velocity: 102.00 cm/s MV E/A ratio:  0.78 Shirlee More MD Electronically signed by Shirlee More MD Signature Date/Time: 12/25/2019/5:15:16  PM    Final    Disposition   Pt is being discharged home today in good condition.  Follow-up Plans & Appointments    Follow-up Information    Tobb, Kardie, DO. Go on 02/04/2020.   Specialty: Cardiology Why: '@11' :20am for cath follow up  Contact information: [place] [address] [phone]          Discharge Instructions    AMB Referral to Cardiac Rehabilitation - Phase II   Complete by: As directed    Diagnosis: Coronary Stents   After initial evaluation and assessments completed: Virtual Based Care may be provided alone or in conjunction with Phase 2 Cardiac Rehab based on Love barriers.: Yes   Diet - low sodium heart healthy   Complete by: As directed    Discharge instructions   Complete by: As directed    No driving for 48 hours. No lifting over 5 lbs for 1 week. No sexual activity for 1 week.  Keep procedure site clean & dry. If  you notice increased pain, swelling, bleeding or pus, call/return!  You may shower, but no soaking baths/hot tubs/pools for 1 week.   Increase activity slowly   Complete by: As directed       Discharge Medications   Allergies as of 01/07/2020      Reactions   Colchicine    Stomach cramps   Diclofenac    Oral causes stomach cramps   Janumet [sitagliptin-metformin Hcl] Nausea And Vomiting   Lightheaded    Septra [sulfamethoxazole-trimethoprim] Itching   Trulicity [dulaglutide] Diarrhea, Nausea And Vomiting   Zetia [ezetimibe]    Muscle pain   Imipramine Rash   Tape Rash   Paper tape ok to use       Medication List    STOP taking these medications   omeprazole 20 MG capsule Commonly known as: PRILOSEC     TAKE these medications   Accu-Chek Guide Me w/Device Kit 1 each by Does not apply route 2 (two) times daily. E11.9   Accu-Chek Guide test strip Generic drug: glucose blood 1 each by Other route 2 (two) times daily. E11.9   Accu-Chek Softclix Lancets lancets 1 each by Other route in Pamela morning and at bedtime. E11.9   aspirin EC 81 MG tablet Take 1 tablet (81 mg total) by mouth daily.   B-D UF III MINI PEN NEEDLES 31G X 5 MM Misc Generic drug: Insulin Pen Needle 1 each by Other route daily. E11.9   carvedilol 3.125 MG tablet Commonly known as: COREG Take 1 tablet (3.125 mg total) by mouth 2 (two) times daily with a meal.   clopidogrel 75 MG tablet Commonly known as: PLAVIX Take 1 tablet (75 mg total) by mouth daily with breakfast. Start taking on: January 08, 2020   CoQ10 200 MG Caps Take 200 mg by mouth daily.   diclofenac sodium 1 % Gel Commonly known as: VOLTAREN Apply 1 application topically at bedtime as needed (knee pain.).   DULoxetine 20 MG capsule Commonly known as: CYMBALTA Take 40 mg by mouth at bedtime.   furosemide 40 MG tablet Commonly known as: LASIX Take 1 tablet (40 mg total) by mouth 3 (three) times a week.   multivitamin with  minerals Tabs tablet Take 1 tablet by mouth every morning.   nitroGLYCERIN 0.4 MG SL tablet Commonly known as: NITROSTAT Place 1 tablet (0.4 mg total) under Pamela tongue every 5 (five) minutes as needed for chest pain.   pantoprazole 40  MG tablet Commonly known as: Protonix Take 1 tablet (40 mg total) by mouth daily.   potassium chloride SA 20 MEQ tablet Commonly known as: KLOR-CON Take 1 tablet (20 mEq total) by mouth 3 (three) times a week.   RA Fish Oil 1400 MG Cpdr Take 1,400 mg by mouth 2 (two) times daily.   rosuvastatin 40 MG tablet Commonly known as: CRESTOR Take 1 tablet (40 mg total) by mouth every morning. Start taking on: January 08, 2020 What changed:   medication strength  how much to take   Toujeo Max SoloStar 300 UNIT/ML Solostar Pen Generic drug: insulin glargine (2 Unit Dial) Inject 130 Units into Pamela skin every morning. And pen needles 1/day   trospium 20 MG tablet Commonly known as: SANCTURA Take 20 mg by mouth 2 (two) times daily.   Tylenol Arthritis Pain 650 MG CR tablet Generic drug: acetaminophen Take 1,300 mg by mouth at bedtime.   valsartan-hydrochlorothiazide 160-12.5 MG tablet Commonly known as: DIOVAN-HCT Take 1 tablet by mouth daily.   vitamin C 250 MG tablet Commonly known as: ASCORBIC ACID Take 250 mg by mouth daily.   Vitamin D 50 MCG (2000 UT) Caps Take 2,000 Units by mouth every morning.          Outstanding Labs/Studies   Consider OP f/u labs 6-8 weeks given statin initiation this admission.  Duration of Discharge Encounter   Greater than 30 minutes including physician time.  Jarrett Soho, PA 01/07/2020, 9:47 AM  Love seen, examined. Available data reviewed. Agree with findings, assessment, and plan as outlined by Robbie Lis, PA.  Pamela Love is independently interviewed and examined.  Her husband is at Pamela bedside.  Heart is regular rate and rhythm without murmur gallop, lungs are clear, extremities have  no edema, right radial cath site is clear.  She underwent uncomplicated PCI of Pamela LAD yesterday.  She had some shortness of breath when ambulating today, otherwise no complaints.  She feels well at present.  I think she is medically stable for discharge.  We discussed Pamela importance of medication adherence.  I strongly encouraged her to participate in phase 2 cardiac rehab as an outpatient.  Sherren Mocha, M.D. 01/07/2020 10:47 AM

## 2020-01-07 NOTE — Discharge Instructions (Signed)
Radial Site Care  This sheet gives you information about how to care for yourself after your procedure. Your health care provider may also give you more specific instructions. If you have problems or questions, contact your health care provider. What can I expect after the procedure? After the procedure, it is common to have:  Bruising and tenderness at the catheter insertion area. Follow these instructions at home: Medicines  Take over-the-counter and prescription medicines only as told by your health care provider. Insertion site care  Follow instructions from your health care provider about how to take care of your insertion site. Make sure you: ? Wash your hands with soap and water before you change your bandage (dressing). If soap and water are not available, use hand sanitizer. ? Change your dressing as told by your health care provider. ? Leave stitches (sutures), skin glue, or adhesive strips in place. These skin closures may need to stay in place for 2 weeks or longer. If adhesive strip edges start to loosen and curl up, you may trim the loose edges. Do not remove adhesive strips completely unless your health care provider tells you to do that.  Check your insertion site every day for signs of infection. Check for: ? Redness, swelling, or pain. ? Fluid or blood. ? Pus or a bad smell. ? Warmth.  Do not take baths, swim, or use a hot tub until your health care provider approves.  You may shower 24-48 hours after the procedure, or as directed by your health care provider. ? Remove the dressing and gently wash the site with plain soap and water. ? Pat the area dry with a clean towel. ? Do not rub the site. That could cause bleeding.  Do not apply powder or lotion to the site. Activity   For 24 hours after the procedure, or as directed by your health care provider: ? Do not flex or bend the affected arm. ? Do not push or pull heavy objects with the affected arm. ? Do not  drive yourself home from the hospital or clinic. You may drive 24 hours after the procedure unless your health care provider tells you not to. ? Do not operate machinery or power tools.  Do not lift anything that is heavier than 10 lb (4.5 kg), or the limit that you are told, until your health care provider says that it is safe.  Ask your health care provider when it is okay to: ? Return to work or school. ? Resume usual physical activities or sports. ? Resume sexual activity. General instructions  If the catheter site starts to bleed, raise your arm and put firm pressure on the site. If the bleeding does not stop, get help right away. This is a medical emergency.  If you went home on the same day as your procedure, a responsible adult should be with you for the first 24 hours after you arrive home.  Keep all follow-up visits as told by your health care provider. This is important. Contact a health care provider if:  You have a fever.  You have redness, swelling, or yellow drainage around your insertion site. Get help right away if:  You have unusual pain at the radial site.  The catheter insertion area swells very fast.  The insertion area is bleeding, and the bleeding does not stop when you hold steady pressure on the area.  Your arm or hand becomes pale, cool, tingly, or numb. These symptoms may represent a serious problem   that is an emergency. Do not wait to see if the symptoms will go away. Get medical help right away. Call your local emergency services (911 in the [place] Do not drive yourself to the hospital. Summary  After the procedure, it is common to have bruising and tenderness at the site.  Follow instructions from your health care provider about how to take care of your radial site wound. Check the wound every day for signs of infection.  Do not lift anything that is heavier than 10 lb (4.5 kg), or the limit that you are told, until your health care provider says  that it is safe. This information is not intended to replace advice given to you by your health care provider. Make sure you discuss any questions you have with your health care provider. Document Revised: 08/23/2017 Document Reviewed: 08/23/2017 Elsevier Patient Education  2020 [employer]  

## 2020-01-07 NOTE — Progress Notes (Signed)
CARDIAC REHAB PHASE I   PRE:  Rate/Rhythm: 88 SR    BP: sitting 135/75    SaO2:   MODE:  Ambulation: 370 ft   POST:  Rate/Rhythm: 107 ST    BP: sitting 166/88     SaO2:   Ambulated hall but c/o SOB with distance. Sts she normally doesn't get SOB with walking flat. HR and BP elevated. Discussed restrictions, stent, Plavix, diet, exercise, NTG, and CRPII with pt and husband. Receptive. Will watch diet more and increase exercise. Will refer to [facility].  [phone]  Harriet Masson CES, ACSM 01/07/2020 10:19 AM

## 2020-01-10 ENCOUNTER — Telehealth (HOSPITAL_COMMUNITY): Payer: Self-pay

## 2020-01-10 NOTE — Telephone Encounter (Signed)
Faxed cardiac rehab referral to [place] cardiac rehab program per Phase I. °

## 2020-01-13 ENCOUNTER — Telehealth: Payer: Self-pay | Admitting: *Deleted

## 2020-01-13 ENCOUNTER — Telehealth: Payer: Self-pay

## 2020-01-13 NOTE — Telephone Encounter (Signed)
Spoke to the patient and let her know that I got her scheduled for her sleep study on 01/31/20. I also let her know that she will be scheduled for a COVID swab on 01/29/20. I let her know that I was told that she would receive a packet in the mail with all of this information included in it. She verbalizes understanding and thanks me for the call.   Encouraged patient to call back with any questions or concerns.

## 2020-01-13 NOTE — Telephone Encounter (Signed)
-----   Message from Gaynelle Cage, CMA sent at 01/13/2020  8:46 AM EDT ----- Ethlyn Gallery received. Ok to schedule sleep study. Auth # [id number]. Valid dates 01/20/20 to 02/19/20. Please call sleep lab [phone] to schedule. We only get auth. Ordering MD is responsible for scheduling. Thanks! ----- Message ----- From: Thomasene Ripple, DO Sent: 01/01/2020   9:28 AM EDT To: Gaynelle Cage, CMA   Okay will do thank you ----- Message ----- From: Gaynelle Cage, CMA Sent: 12/31/2019   6:16 PM EDT To: Thomasene Ripple, DO  PA has been submitted, however Dr Servando Salina please addend  12/12/19 office note adding you are ordering sleep study to rule out OSA. This has to be in the body of your dictation.   Thanks, Burna Mortimer ----- Message ----- From: Felecia Jan, RN Sent: 12/12/2019   3:36 PM EDT To: [facility]  This patient needs to have a sleep study done. Please pre-cert this patient and let me know when it is done so I can call to get her scheduled.   Thanks,  Lequita Halt, RN

## 2020-01-13 NOTE — Telephone Encounter (Signed)
Staff message sent to Delorse Limber, RN [facility] auth received. Ok to schedule sleep study. Scheduling information provided. [facility] Auth # [id number]. Valid dates 01/20/20 to 02/19/20.

## 2020-01-20 DIAGNOSIS — K219 Gastro-esophageal reflux disease without esophagitis: Secondary | ICD-10-CM | POA: Diagnosis not present

## 2020-01-20 DIAGNOSIS — I2510 Atherosclerotic heart disease of native coronary artery without angina pectoris: Secondary | ICD-10-CM | POA: Diagnosis not present

## 2020-01-20 DIAGNOSIS — Z79899 Other long term (current) drug therapy: Secondary | ICD-10-CM | POA: Diagnosis not present

## 2020-01-20 DIAGNOSIS — Z7982 Long term (current) use of aspirin: Secondary | ICD-10-CM | POA: Diagnosis not present

## 2020-01-20 DIAGNOSIS — Z955 Presence of coronary angioplasty implant and graft: Secondary | ICD-10-CM | POA: Diagnosis not present

## 2020-01-20 DIAGNOSIS — E119 Type 2 diabetes mellitus without complications: Secondary | ICD-10-CM | POA: Diagnosis not present

## 2020-01-22 DIAGNOSIS — E119 Type 2 diabetes mellitus without complications: Secondary | ICD-10-CM | POA: Diagnosis not present

## 2020-01-22 DIAGNOSIS — Z955 Presence of coronary angioplasty implant and graft: Secondary | ICD-10-CM | POA: Diagnosis not present

## 2020-01-22 DIAGNOSIS — Z7982 Long term (current) use of aspirin: Secondary | ICD-10-CM | POA: Diagnosis not present

## 2020-01-22 DIAGNOSIS — I2510 Atherosclerotic heart disease of native coronary artery without angina pectoris: Secondary | ICD-10-CM | POA: Diagnosis not present

## 2020-01-22 DIAGNOSIS — K219 Gastro-esophageal reflux disease without esophagitis: Secondary | ICD-10-CM | POA: Diagnosis not present

## 2020-01-22 DIAGNOSIS — Z79899 Other long term (current) drug therapy: Secondary | ICD-10-CM | POA: Diagnosis not present

## 2020-01-24 DIAGNOSIS — Z79899 Other long term (current) drug therapy: Secondary | ICD-10-CM | POA: Diagnosis not present

## 2020-01-24 DIAGNOSIS — Z955 Presence of coronary angioplasty implant and graft: Secondary | ICD-10-CM | POA: Diagnosis not present

## 2020-01-24 DIAGNOSIS — I2510 Atherosclerotic heart disease of native coronary artery without angina pectoris: Secondary | ICD-10-CM | POA: Diagnosis not present

## 2020-01-24 DIAGNOSIS — Z7982 Long term (current) use of aspirin: Secondary | ICD-10-CM | POA: Diagnosis not present

## 2020-01-24 DIAGNOSIS — K219 Gastro-esophageal reflux disease without esophagitis: Secondary | ICD-10-CM | POA: Diagnosis not present

## 2020-01-24 DIAGNOSIS — E119 Type 2 diabetes mellitus without complications: Secondary | ICD-10-CM | POA: Diagnosis not present

## 2020-01-27 DIAGNOSIS — E119 Type 2 diabetes mellitus without complications: Secondary | ICD-10-CM | POA: Diagnosis not present

## 2020-01-27 DIAGNOSIS — Z7982 Long term (current) use of aspirin: Secondary | ICD-10-CM | POA: Diagnosis not present

## 2020-01-27 DIAGNOSIS — I2510 Atherosclerotic heart disease of native coronary artery without angina pectoris: Secondary | ICD-10-CM | POA: Diagnosis not present

## 2020-01-27 DIAGNOSIS — K219 Gastro-esophageal reflux disease without esophagitis: Secondary | ICD-10-CM | POA: Diagnosis not present

## 2020-01-27 DIAGNOSIS — Z79899 Other long term (current) drug therapy: Secondary | ICD-10-CM | POA: Diagnosis not present

## 2020-01-27 DIAGNOSIS — Z955 Presence of coronary angioplasty implant and graft: Secondary | ICD-10-CM | POA: Diagnosis not present

## 2020-01-29 ENCOUNTER — Other Ambulatory Visit (HOSPITAL_COMMUNITY): Payer: Medicare PPO

## 2020-01-29 DIAGNOSIS — E119 Type 2 diabetes mellitus without complications: Secondary | ICD-10-CM | POA: Diagnosis not present

## 2020-01-29 DIAGNOSIS — I2510 Atherosclerotic heart disease of native coronary artery without angina pectoris: Secondary | ICD-10-CM | POA: Diagnosis not present

## 2020-01-29 DIAGNOSIS — Z955 Presence of coronary angioplasty implant and graft: Secondary | ICD-10-CM | POA: Diagnosis not present

## 2020-01-29 DIAGNOSIS — K219 Gastro-esophageal reflux disease without esophagitis: Secondary | ICD-10-CM | POA: Diagnosis not present

## 2020-01-29 DIAGNOSIS — Z79899 Other long term (current) drug therapy: Secondary | ICD-10-CM | POA: Diagnosis not present

## 2020-01-29 DIAGNOSIS — Z7982 Long term (current) use of aspirin: Secondary | ICD-10-CM | POA: Diagnosis not present

## 2020-01-31 ENCOUNTER — Other Ambulatory Visit: Payer: Self-pay

## 2020-01-31 ENCOUNTER — Ambulatory Visit (HOSPITAL_BASED_OUTPATIENT_CLINIC_OR_DEPARTMENT_OTHER): Payer: Medicare PPO | Attending: Cardiology | Admitting: Cardiovascular Disease

## 2020-01-31 DIAGNOSIS — K219 Gastro-esophageal reflux disease without esophagitis: Secondary | ICD-10-CM | POA: Diagnosis not present

## 2020-01-31 DIAGNOSIS — G4700 Insomnia, unspecified: Secondary | ICD-10-CM | POA: Insufficient documentation

## 2020-01-31 DIAGNOSIS — Z791 Long term (current) use of non-steroidal anti-inflammatories (NSAID): Secondary | ICD-10-CM | POA: Diagnosis not present

## 2020-01-31 DIAGNOSIS — Z7982 Long term (current) use of aspirin: Secondary | ICD-10-CM | POA: Insufficient documentation

## 2020-01-31 DIAGNOSIS — Z79899 Other long term (current) drug therapy: Secondary | ICD-10-CM | POA: Insufficient documentation

## 2020-01-31 DIAGNOSIS — R0902 Hypoxemia: Secondary | ICD-10-CM | POA: Diagnosis not present

## 2020-01-31 DIAGNOSIS — G4733 Obstructive sleep apnea (adult) (pediatric): Secondary | ICD-10-CM

## 2020-01-31 DIAGNOSIS — I2510 Atherosclerotic heart disease of native coronary artery without angina pectoris: Secondary | ICD-10-CM | POA: Diagnosis not present

## 2020-01-31 DIAGNOSIS — Z955 Presence of coronary angioplasty implant and graft: Secondary | ICD-10-CM | POA: Diagnosis not present

## 2020-01-31 DIAGNOSIS — E119 Type 2 diabetes mellitus without complications: Secondary | ICD-10-CM | POA: Diagnosis not present

## 2020-01-31 NOTE — Telephone Encounter (Signed)
This pt wants to know about their sleep study. Please look into it and let her know

## 2020-02-04 ENCOUNTER — Ambulatory Visit: Payer: Medicare PPO | Admitting: Cardiology

## 2020-02-04 ENCOUNTER — Encounter: Payer: Self-pay | Admitting: Cardiology

## 2020-02-04 ENCOUNTER — Other Ambulatory Visit: Payer: Self-pay

## 2020-02-04 VITALS — BP 122/70 | HR 92 | Ht 63.0 in | Wt 233.0 lb

## 2020-02-04 DIAGNOSIS — E782 Mixed hyperlipidemia: Secondary | ICD-10-CM

## 2020-02-04 DIAGNOSIS — I2510 Atherosclerotic heart disease of native coronary artery without angina pectoris: Secondary | ICD-10-CM

## 2020-02-04 DIAGNOSIS — I10 Essential (primary) hypertension: Secondary | ICD-10-CM

## 2020-02-04 MED ORDER — POTASSIUM CHLORIDE CRYS ER 20 MEQ PO TBCR: 20.0000 meq | EXTENDED_RELEASE_TABLET | ORAL | 3 refills | Status: DC

## 2020-02-04 MED ORDER — FUROSEMIDE 40 MG PO TABS: 40.0000 mg | ORAL_TABLET | ORAL | 3 refills | Status: DC

## 2020-02-04 NOTE — Patient Instructions (Signed)
Medication Instructions:  Your physician recommends that you continue on your current medications as directed. Please refer to the Current Medication list given to you today.  *If you need a refill on your cardiac medications before your next appointment, please call your pharmacy*   Lab Work: None.  If you have labs (blood work) drawn today and your tests are completely normal, you will receive your results only by: . MyChart Message (if you have MyChart) OR . A paper copy in the mail If you have any lab test that is abnormal or we need to change your treatment, we will call you to review the results.   Testing/Procedures: None   Follow-Up: At [facility], you and your health needs are our priority.  As part of our continuing mission to provide you with exceptional heart care, we have created designated Provider Care Teams.  These Care Teams include your primary Cardiologist (physician) and Advanced Practice Providers (APPs -  Physician Assistants and Nurse Practitioners) who all work together to provide you with the care you need, when you need it.  We recommend signing up for the patient portal called "MyChart".  Sign up information is provided on this After Visit Summary.  MyChart is used to connect with patients for Virtual Visits (Telemedicine).  Patients are able to view lab/test results, encounter notes, upcoming appointments, etc.  Non-urgent messages can be sent to your provider as well.   To learn more about what you can do with MyChart, go to [email or web address].    Your next appointment:   3 month(s)  The format for your next appointment:   In Person  Provider:   Kardie Tobb, DO   Other Instructions    

## 2020-02-04 NOTE — Progress Notes (Signed)
Cardiology Office Note:    Date:  02/04/2020   ID:  Pamela Love, DOB 12-16-1950, MRN [id number]  PCP:  Pamela Neer, MD  Cardiologist:  Pamela Salines, DO  Electrophysiologist:  None   Referring MD: Pamela Neer, MD     History of Present Illness:    Pamela Love is a 69 y.o. female with a hx of coronary artery disease status post stent on January 06, 2020 to RCA as a result of a positive stress test, hypertension, diabetes mellitus, hyperlipidemia presents today for follow-up visit.  She tells me that she still is experiencing some shortness of breath.  No chest pain.  She had her sleep study on Friday and is still pending results.   Past Medical History:  Diagnosis Date  . Arthritis   . Bursitis    hips  . C. difficile colitis 12/2017  . Complication of anesthesia    "spinal did not work with second c section"  . Diabetes mellitus ([place]) 11/19/2015  . Diabetes mellitus without complication ([place])    diet controlled - has Rx for Glymipride but has not started taking yet  . Dyslipidemia 10/21/2014  . GERD (gastroesophageal reflux disease)   . History of nonmelanoma skin cancer   . History of skin cancer   . Hypercholesterolemia 12/11/2019  . Hyperlipidemia   . Hypertension   . Hypertensive disorder 10/21/2014  . OAB (overactive bladder)   . Osteoarthritis of right knee 05/15/2015  . Osteoarthrosis, unspecified whether generalized or localized, involving lower leg 10/21/2014  . Osteoporosis   . Primary osteoarthritis of left knee 12/18/2014  . S/P knee replacement 12/18/2014  . S/P total knee replacement using cement 05/15/2015  . Tachycardia 12/11/2019    Past Surgical History:  Procedure Laterality Date  . ABDOMINAL HYSTERECTOMY  2005  . CESAREAN SECTION     x2  . colonscopy     . CORONARY STENT INTERVENTION N/A 01/06/2020   Procedure: CORONARY STENT INTERVENTION;  Surgeon: Lorretta Harp, MD;  Location: [facility];  Service: Cardiovascular;   Laterality: N/A;  . EYE SURGERY     bilat cataract surgery 2015  . INTRAVASCULAR PRESSURE WIRE/FFR STUDY N/A 01/06/2020   Procedure: INTRAVASCULAR PRESSURE WIRE/FFR STUDY;  Surgeon: Lorretta Harp, MD;  Location: [place] [facility];  Service: Cardiovascular;  Laterality: N/A;  . LEFT HEART CATH AND CORONARY ANGIOGRAPHY N/A 01/06/2020   Procedure: LEFT HEART CATH AND CORONARY ANGIOGRAPHY;  Surgeon: Lorretta Harp, MD;  Location: [place] [facility];  Service: Cardiovascular;  Laterality: N/A;  . TOTAL KNEE ARTHROPLASTY Left 12/18/2014   Procedure: TOTAL LEFT KNEE ARTHROPLASTY;  Surgeon: Pamela Cabal, MD;  Location: [facility];  Service: Orthopedics;  Laterality: Left;  . TOTAL KNEE ARTHROPLASTY Right 05/15/2015   Procedure: RIGHT TOTAL KNEE ARTHROPLASTY;  Surgeon: Pamela Cabal, MD;  Location: [facility];  Service: Orthopedics;  Laterality: Right;  . UTERINE FIBROID SURGERY      Current Medications: Current Meds  Medication Sig  . Accu-Chek Softclix Lancets lancets 1 each by Other route in the morning and at bedtime. E11.9  . acetaminophen (TYLENOL ARTHRITIS PAIN) 650 MG CR tablet Take 1,300 mg by mouth at bedtime.   Pamela Love aspirin EC 81 MG tablet Take 1 tablet (81 mg total) by mouth daily.  . Blood Glucose Monitoring Suppl (ACCU-CHEK GUIDE ME) w/Device KIT 1 each by Does not apply route 2 (two) times daily. E11.9  . carvedilol (COREG) 3.125 MG tablet Take 1 tablet (3.125  mg total) by mouth 2 (two) times daily with a meal.  . Cholecalciferol (VITAMIN D) 2000 UNITS CAPS Take 2,000 Units by mouth every morning.   . clopidogrel (PLAVIX) 75 MG tablet Take 1 tablet (75 mg total) by mouth daily with breakfast.  . Coenzyme Q10 (COQ10) 200 MG CAPS Take 200 mg by mouth daily.   . diclofenac sodium (VOLTAREN) 1 % GEL Apply 1 application topically at bedtime as needed (knee pain.).   Pamela Love DULoxetine (CYMBALTA) 20 MG capsule Take 40 mg by mouth at bedtime.   Pamela Love glucose blood (ACCU-CHEK GUIDE) test strip 1 each by  Other route 2 (two) times daily. E11.9  . insulin glargine, 2 Unit Dial, (TOUJEO MAX SOLOSTAR) 300 UNIT/ML Solostar Pen Inject 130 Units into the skin every morning. And pen needles 1/day  . Insulin Pen Needle (B-D UF III MINI PEN NEEDLES) 31G X 5 MM MISC 1 each by Other route daily. E11.9  . Multiple Vitamin (MULTIVITAMIN WITH MINERALS) TABS tablet Take 1 tablet by mouth every morning.   . nitroGLYCERIN (NITROSTAT) 0.4 MG SL tablet Place 1 tablet (0.4 mg total) under the tongue every 5 (five) minutes as needed for chest pain.  . Omega-3 Fatty Acids (RA FISH OIL) 1400 MG CPDR Take 1,400 mg by mouth 2 (two) times daily.  . pantoprazole (PROTONIX) 40 MG tablet Take 1 tablet (40 mg total) by mouth daily.  . rosuvastatin (CRESTOR) 20 MG tablet Take by mouth daily.   Pamela Love saccharomyces boulardii (FLORASTOR) 250 MG capsule Take 250 mg by mouth daily.  . trospium (SANCTURA) 20 MG tablet Take 20 mg by mouth 2 (two) times daily.  . valsartan-hydrochlorothiazide (DIOVAN-HCT) 160-12.5 MG tablet Take 1 tablet by mouth daily.  . vitamin C (ASCORBIC ACID) 250 MG tablet Take 250 mg by mouth daily.     Allergies:   Colchicine, Diclofenac, Janumet [sitagliptin-metformin hcl], Septra [sulfamethoxazole-trimethoprim], Trulicity [dulaglutide], Zetia [ezetimibe], Imipramine, and Tape   Social History   Socioeconomic History  . Marital status: Married    Spouse name: Not on file  . Number of children: Not on file  . Years of education: Not on file  . Highest education level: Not on file  Occupational History  . Not on file  Tobacco Use  . Smoking status: Never Smoker  . Smokeless tobacco: Never Used  Vaping Use  . Vaping Use: Never used  Substance and Sexual Activity  . Alcohol use: Yes    Comment: rare  . Drug use: No  . Sexual activity: Not on file  Other Topics Concern  . Not on file  Social History Narrative  . Not on file   Social Determinants of Health   Financial Resource Strain:   .  Difficulty of Paying Living Expenses:   Food Insecurity:   . Worried About Charity fundraiser in the Last Year:   . Arboriculturist in the Last Year:   Transportation Needs:   . Film/video editor (Medical):   Pamela Love Lack of Transportation (Non-Medical):   Physical Activity:   . Days of Exercise per Week:   . Minutes of Exercise per Session:   Stress:   . Feeling of Stress :   Social Connections:   . Frequency of Communication with Friends and Family:   . Frequency of Social Gatherings with Friends and Family:   . Attends Religious Services:   . Active Member of Clubs or Organizations:   . Attends Archivist Meetings:   .  Marital Status:      Family History: The patient's family history includes Congenital heart disease in her son; Diabetes in her maternal grandfather; Heart attack in her father, maternal grandfather, paternal uncle, and sister; Heart disease in her mother.  ROS:   Review of Systems  Constitution: Negative for decreased appetite, fever and weight gain.  HENT: Negative for congestion, ear discharge, hoarse voice and sore throat.   Eyes: Negative for discharge, redness, vision loss in right eye and visual halos.  Cardiovascular: Negative for chest pain, dyspnea on exertion, leg swelling, orthopnea and palpitations.  Respiratory: Negative for cough, hemoptysis, shortness of breath and snoring.   Endocrine: Negative for heat intolerance and polyphagia.  Hematologic/Lymphatic: Negative for bleeding problem. Does not bruise/bleed easily.  Skin: Negative for flushing, nail changes, rash and suspicious lesions.  Musculoskeletal: Negative for arthritis, joint pain, muscle cramps, myalgias, neck pain and stiffness.  Gastrointestinal: Negative for abdominal pain, bowel incontinence, diarrhea and excessive appetite.  Genitourinary: Negative for decreased libido, genital sores and incomplete emptying.  Neurological: Negative for brief paralysis, focal weakness,  headaches and loss of balance.  Psychiatric/Behavioral: Negative for altered mental status, depression and suicidal ideas.  Allergic/Immunologic: Negative for HIV exposure and persistent infections.    EKGs/Labs/Other Studies Reviewed:    The following studies were reviewed today:   EKG:  None today  LHC 01/06/20  Prox LAD to Mid LAD lesion is 60% stenosed.  Prox RCA to Mid RCA lesion is 90% stenosed.  A drug-eluting stent was successfully placed using a Arnett [id number].  Post intervention, there is a 0% residual stenosis.   Echo IMPRESSIONS  1. Left ventricular ejection fraction, by estimation, is 60 to 65%. The  left ventricle has normal function. The left ventricle has no regional  wall motion abnormalities. Left ventricular diastolic parameters are  consistent with Grade I diastolic  dysfunction (impaired relaxation).  2. Right ventricular systolic function is normal. The right ventricular  size is normal.  3. The mitral valve is normal in structure. No evidence of mitral valve regurgitation. No evidence of mitral stenosis.  4. The aortic valve is tricuspid. Aortic valve regurgitation is not visualized. Mild aortic valve sclerosis is present, with no evidence of aortic valve stenosis.  5. The inferior vena cava is normal in size with greater than 50% respiratory variability, suggesting right atrial pressure of 3 mmHg.   Recent Labs: 01/02/2020: Magnesium 1.6 01/07/2020: BUN 13; Creatinine, Ser 0.94; Hemoglobin 13.2; Platelets 225; Potassium 3.9; Sodium 140  Recent Lipid Panel No results found for: CHOL, TRIG, HDL, CHOLHDL, VLDL, LDLCALC, LDLDIRECT  Physical Exam:    VS:  BP 122/70   Pulse 92   Ht '5\' 3"'  (1.6 m)   Wt 233 lb (105.7 kg)   SpO2 96%   BMI 41.27 kg/m     Wt Readings from Last 3 Encounters:  02/04/20 233 lb (105.7 kg)  01/31/20 232 lb (105.2 kg)  01/07/20 234 lb 9.1 oz (106.4 kg)     GEN: Well nourished, well developed in no acute  distress HEENT: Normal NECK: No JVD; No carotid bruits LYMPHATICS: No lymphadenopathy CARDIAC: S1S2 noted,RRR, no murmurs, rubs, gallops RESPIRATORY:  Clear to auscultation without rales, wheezing or rhonchi  ABDOMEN: Soft, non-tender, non-distended, +bowel sounds, no guarding. EXTREMITIES: No edema, No cyanosis, no clubbing MUSCULOSKELETAL:  No deformity  SKIN: Warm and dry NEUROLOGIC:  Alert and oriented x 3, non-focal PSYCHIATRIC:  Normal affect, good insight  ASSESSMENT:    1. Coronary  artery disease involving native coronary artery of native heart without angina pectoris   2. Essential hypertension   3. Mixed hyperlipidemia   4. Morbid obesity ([place])    PLAN:     We will continue patient on her aspirin 81 mg daily along with Plavix before DAPT therapy.  She will remain on DAPT therapy for minimum of 6 months.  Advised the patient that during this time we will not be able to stop her DAPT therapy for any reason.  Hypertension-blood pressures acceptable in the office no changes will be made to her medication.  Hyperlipidemia her Crestor was increased to 40 mg at the time of her catheterization however the patient has been taking 20 mg- Asked the patient to increase the Crestor to 40 mg daily. Her LDL goal is 70.  Morbid obesity-the patient understands the need to lose weight with diet and exercise. We have discussed specific strategies for this.  The patient is in agreement with the above plan. The patient left the office in stable condition.  The patient will follow up in 3 months or sooner if needed.   Medication Adjustments/Labs and Tests Ordered: Current medicines are reviewed at length with the patient today.  Concerns regarding medicines are outlined above.  No orders of the defined types were placed in this encounter.  Meds ordered this encounter  Medications  . furosemide (LASIX) 40 MG tablet    Sig: Take 1 tablet (40 mg total) by mouth 3 (three) times a week.     Dispense:  36 tablet    Refill:  3  . potassium chloride SA (KLOR-CON) 20 MEQ tablet    Sig: Take 1 tablet (20 mEq total) by mouth 3 (three) times a week.    Dispense:  36 tablet    Refill:  3    Patient Instructions  Medication Instructions:  Your physician recommends that you continue on your current medications as directed. Please refer to the Current Medication list given to you today.  *If you need a refill on your cardiac medications before your next appointment, please call your pharmacy*   Lab Work: None If you have labs (blood work) drawn today and your tests are completely normal, you will receive your results only by: Pamela Love MyChart Message (if you have MyChart) OR . A paper copy in the mail If you have any lab test that is abnormal or we need to change your treatment, we will call you to review the results.   Testing/Procedures: None   Follow-Up: At [facility], you and your health needs are our priority.  As part of our continuing mission to provide you with exceptional heart care, we have created designated Provider Care Teams.  These Care Teams include your primary Cardiologist (physician) and Advanced Practice Providers (APPs -  Physician Assistants and Nurse Practitioners) who all work together to provide you with the care you need, when you need it.  We recommend signing up for the patient portal called "MyChart".  Sign up information is provided on this After Visit Summary.  MyChart is used to connect with patients for Virtual Visits (Telemedicine).  Patients are able to view lab/test results, encounter notes, upcoming appointments, etc.  Non-urgent messages can be sent to your provider as well.   To learn more about what you can do with MyChart, go to [email or web address].    Your next appointment:   3 month(s)  The format for your next appointment:   In Person  Provider:  Berniece Salines, DO   Other Instructions      Adopting a Healthy  Lifestyle.  Know what a healthy weight is for you (roughly BMI <25) and aim to maintain this   Aim for 7+ servings of fruits and vegetables daily   65-80+ fluid ounces of water or unsweet tea for healthy kidneys   Limit to max 1 drink of alcohol per day; avoid smoking/tobacco   Limit animal fats in diet for cholesterol and heart health - choose grass fed whenever available   Avoid highly processed foods, and foods high in saturated/trans fats   Aim for low stress - take time to unwind and care for your mental health   Aim for 150 min of moderate intensity exercise weekly for heart health, and weights twice weekly for bone health   Aim for 7-9 hours of sleep daily   When it comes to diets, agreement about the perfect plan isnt easy to find, even among the experts. Experts at the [employer] developed an idea known as the Healthy Eating Plate. Just imagine a plate divided into logical, healthy portions.   The emphasis is on diet quality:   Load up on vegetables and fruits - one-half of your plate: Aim for color and variety, and remember that potatoes dont count.   Go for whole grains - one-quarter of your plate: Whole wheat, barley, wheat berries, quinoa, oats, brown rice, and foods made with them. If you want pasta, go with whole wheat pasta.   Protein power - one-quarter of your plate: Fish, chicken, beans, and nuts are all healthy, versatile protein sources. Limit red meat.   The diet, however, does go beyond the plate, offering a few other suggestions.   Use healthy plant oils, such as olive, canola, soy, corn, sunflower and peanut. Check the labels, and avoid partially hydrogenated oil, which have unhealthy trans fats.   If youre thirsty, drink water. Coffee and tea are good in moderation, but skip sugary drinks and limit milk and dairy products to one or two daily servings.   The type of carbohydrate in the diet is more important than the amount. Some  sources of carbohydrates, such as vegetables, fruits, whole grains, and beans-are healthier than others.   Finally, stay active  Signed, Pamela Salines, DO  02/04/2020 12:02 PM    [place]

## 2020-02-05 DIAGNOSIS — Z79899 Other long term (current) drug therapy: Secondary | ICD-10-CM | POA: Diagnosis not present

## 2020-02-05 DIAGNOSIS — K219 Gastro-esophageal reflux disease without esophagitis: Secondary | ICD-10-CM | POA: Diagnosis not present

## 2020-02-05 DIAGNOSIS — I2510 Atherosclerotic heart disease of native coronary artery without angina pectoris: Secondary | ICD-10-CM | POA: Diagnosis not present

## 2020-02-05 DIAGNOSIS — Z955 Presence of coronary angioplasty implant and graft: Secondary | ICD-10-CM | POA: Diagnosis not present

## 2020-02-05 DIAGNOSIS — Z7982 Long term (current) use of aspirin: Secondary | ICD-10-CM | POA: Diagnosis not present

## 2020-02-05 DIAGNOSIS — E119 Type 2 diabetes mellitus without complications: Secondary | ICD-10-CM | POA: Diagnosis not present

## 2020-02-10 ENCOUNTER — Ambulatory Visit: Payer: Medicare PPO | Admitting: Endocrinology

## 2020-02-10 ENCOUNTER — Telehealth: Payer: Self-pay | Admitting: Cardiovascular Disease

## 2020-02-10 DIAGNOSIS — Z7982 Long term (current) use of aspirin: Secondary | ICD-10-CM | POA: Diagnosis not present

## 2020-02-10 DIAGNOSIS — I2510 Atherosclerotic heart disease of native coronary artery without angina pectoris: Secondary | ICD-10-CM | POA: Diagnosis not present

## 2020-02-10 DIAGNOSIS — K219 Gastro-esophageal reflux disease without esophagitis: Secondary | ICD-10-CM | POA: Diagnosis not present

## 2020-02-10 DIAGNOSIS — E119 Type 2 diabetes mellitus without complications: Secondary | ICD-10-CM | POA: Diagnosis not present

## 2020-02-10 DIAGNOSIS — Z955 Presence of coronary angioplasty implant and graft: Secondary | ICD-10-CM | POA: Diagnosis not present

## 2020-02-10 DIAGNOSIS — Z79899 Other long term (current) drug therapy: Secondary | ICD-10-CM | POA: Diagnosis not present

## 2020-02-10 NOTE — Telephone Encounter (Signed)
The patient has been called and notified that the sleep study results have not been read and that someone will reach out to her when they have been completed.

## 2020-02-10 NOTE — Telephone Encounter (Signed)
    Pt would like to get more info about her split night result

## 2020-02-12 DIAGNOSIS — K219 Gastro-esophageal reflux disease without esophagitis: Secondary | ICD-10-CM | POA: Diagnosis not present

## 2020-02-12 DIAGNOSIS — Z7982 Long term (current) use of aspirin: Secondary | ICD-10-CM | POA: Diagnosis not present

## 2020-02-12 DIAGNOSIS — Z79899 Other long term (current) drug therapy: Secondary | ICD-10-CM | POA: Diagnosis not present

## 2020-02-12 DIAGNOSIS — E119 Type 2 diabetes mellitus without complications: Secondary | ICD-10-CM | POA: Diagnosis not present

## 2020-02-12 DIAGNOSIS — Z955 Presence of coronary angioplasty implant and graft: Secondary | ICD-10-CM | POA: Diagnosis not present

## 2020-02-12 DIAGNOSIS — I2510 Atherosclerotic heart disease of native coronary artery without angina pectoris: Secondary | ICD-10-CM | POA: Diagnosis not present

## 2020-02-14 DIAGNOSIS — I2510 Atherosclerotic heart disease of native coronary artery without angina pectoris: Secondary | ICD-10-CM | POA: Diagnosis not present

## 2020-02-14 DIAGNOSIS — Z79899 Other long term (current) drug therapy: Secondary | ICD-10-CM | POA: Diagnosis not present

## 2020-02-14 DIAGNOSIS — E119 Type 2 diabetes mellitus without complications: Secondary | ICD-10-CM | POA: Diagnosis not present

## 2020-02-14 DIAGNOSIS — Z7982 Long term (current) use of aspirin: Secondary | ICD-10-CM | POA: Diagnosis not present

## 2020-02-14 DIAGNOSIS — Z955 Presence of coronary angioplasty implant and graft: Secondary | ICD-10-CM | POA: Diagnosis not present

## 2020-02-14 DIAGNOSIS — K219 Gastro-esophageal reflux disease without esophagitis: Secondary | ICD-10-CM | POA: Diagnosis not present

## 2020-02-17 DIAGNOSIS — Z7982 Long term (current) use of aspirin: Secondary | ICD-10-CM | POA: Diagnosis not present

## 2020-02-17 DIAGNOSIS — E119 Type 2 diabetes mellitus without complications: Secondary | ICD-10-CM | POA: Diagnosis not present

## 2020-02-17 DIAGNOSIS — K219 Gastro-esophageal reflux disease without esophagitis: Secondary | ICD-10-CM | POA: Diagnosis not present

## 2020-02-17 DIAGNOSIS — Z79899 Other long term (current) drug therapy: Secondary | ICD-10-CM | POA: Diagnosis not present

## 2020-02-17 DIAGNOSIS — Z955 Presence of coronary angioplasty implant and graft: Secondary | ICD-10-CM | POA: Diagnosis not present

## 2020-02-17 DIAGNOSIS — I2510 Atherosclerotic heart disease of native coronary artery without angina pectoris: Secondary | ICD-10-CM | POA: Diagnosis not present

## 2020-02-19 DIAGNOSIS — K219 Gastro-esophageal reflux disease without esophagitis: Secondary | ICD-10-CM | POA: Diagnosis not present

## 2020-02-19 DIAGNOSIS — Z79899 Other long term (current) drug therapy: Secondary | ICD-10-CM | POA: Diagnosis not present

## 2020-02-19 DIAGNOSIS — I2510 Atherosclerotic heart disease of native coronary artery without angina pectoris: Secondary | ICD-10-CM | POA: Diagnosis not present

## 2020-02-19 DIAGNOSIS — Z955 Presence of coronary angioplasty implant and graft: Secondary | ICD-10-CM | POA: Diagnosis not present

## 2020-02-19 DIAGNOSIS — E119 Type 2 diabetes mellitus without complications: Secondary | ICD-10-CM | POA: Diagnosis not present

## 2020-02-19 DIAGNOSIS — Z7982 Long term (current) use of aspirin: Secondary | ICD-10-CM | POA: Diagnosis not present

## 2020-02-20 ENCOUNTER — Encounter: Payer: Self-pay | Admitting: Endocrinology

## 2020-02-20 ENCOUNTER — Ambulatory Visit: Payer: Medicare PPO | Admitting: Endocrinology

## 2020-02-20 ENCOUNTER — Other Ambulatory Visit: Payer: Self-pay

## 2020-02-20 VITALS — BP 110/72 | HR 82 | Ht 63.0 in | Wt 228.8 lb

## 2020-02-20 DIAGNOSIS — E1121 Type 2 diabetes mellitus with diabetic nephropathy: Secondary | ICD-10-CM | POA: Diagnosis not present

## 2020-02-20 DIAGNOSIS — N1831 Chronic kidney disease, stage 3a: Secondary | ICD-10-CM

## 2020-02-20 LAB — POCT GLYCOSYLATED HEMOGLOBIN (HGB A1C): Hemoglobin A1C: 8 % — AB (ref 4.0–5.6)

## 2020-02-20 MED ORDER — PEN NEEDLES 32G X 5 MM MISC: 1.0000 | Freq: Every day | 3 refills | Status: DC

## 2020-02-20 MED ORDER — TOUJEO MAX SOLOSTAR 300 UNIT/ML ~~LOC~~ SOPN: 140.0000 [IU] | PEN_INJECTOR | SUBCUTANEOUS | 3 refills | Status: DC

## 2020-02-20 NOTE — Patient Instructions (Addendum)
Please increase the Toujeo to 140 units each morning.   check your blood sugar twice a day.  vary the time of day when you check, between before the 3 meals, and at bedtime.  also check if you have symptoms of your blood sugar being too high or too low.  please keep a record of the readings and bring it to your next appointment here (or you can bring the meter itself).  You can write it on any piece of paper.  please call us sooner if your blood sugar goes below 70, or if you have a lot of readings over 200. Please come back for a follow-up appointment in 2-3 months.

## 2020-02-20 NOTE — Progress Notes (Signed)
Subjective:    Patient ID: Pamela Love, female    DOB: Oct 23, 1950, 69 y.o.   MRN: [id number]  HPI Pt returns for f/u of diabetes mellitus: DM type: Insulin-requiring type 2 Dx'ed: 9449 Complications: CRI and CAD Therapy: insulin since 2019 GDM: never.  DKA: never.  Severe hypoglycemia: never.  Pancreatitis: never.  Other: she also took insulin for 6 months, in 2016; CRI, vaginitis, and edema limit rx options; she did not tolerate trulicity (nausea), or repaglinide (diarrhea); she declines multiple daily injections.   Interval history: she brings a record of her cbg's which I have reviewed today.  cbg varies from 121-263.  There is no trend throughout the day.  pt states she feels well in general, except for ongoing weight gain.   Past Medical History:  Diagnosis Date  . Arthritis   . Bursitis    hips  . C. difficile colitis 12/2017  . Complication of anesthesia    "spinal did not work with second c section"  . Diabetes mellitus ([place]) 11/19/2015  . Diabetes mellitus without complication ([place])    diet controlled - has Rx for Glymipride but has not started taking yet  . Dyslipidemia 10/21/2014  . GERD (gastroesophageal reflux disease)   . History of nonmelanoma skin cancer   . History of skin cancer   . Hypercholesterolemia 12/11/2019  . Hyperlipidemia   . Hypertension   . Hypertensive disorder 10/21/2014  . OAB (overactive bladder)   . Osteoarthritis of right knee 05/15/2015  . Osteoarthrosis, unspecified whether generalized or localized, involving lower leg 10/21/2014  . Osteoporosis   . Primary osteoarthritis of left knee 12/18/2014  . S/P knee replacement 12/18/2014  . S/P total knee replacement using cement 05/15/2015  . Tachycardia 12/11/2019    Past Surgical History:  Procedure Laterality Date  . ABDOMINAL HYSTERECTOMY  2005  . CESAREAN SECTION     x2  . colonscopy     . CORONARY STENT INTERVENTION N/A 01/06/2020   Procedure: CORONARY STENT INTERVENTION;   Surgeon: Lorretta Harp, MD;  Location: [facility];  Service: Cardiovascular;  Laterality: N/A;  . EYE SURGERY     bilat cataract surgery 2015  . INTRAVASCULAR PRESSURE WIRE/FFR STUDY N/A 01/06/2020   Procedure: INTRAVASCULAR PRESSURE WIRE/FFR STUDY;  Surgeon: Lorretta Harp, MD;  Location: [facility];  Service: Cardiovascular;  Laterality: N/A;  . LEFT HEART CATH AND CORONARY ANGIOGRAPHY N/A 01/06/2020   Procedure: LEFT HEART CATH AND CORONARY ANGIOGRAPHY;  Surgeon: Lorretta Harp, MD;  Location: [facility];  Service: Cardiovascular;  Laterality: N/A;  . TOTAL KNEE ARTHROPLASTY Left 12/18/2014   Procedure: TOTAL LEFT KNEE ARTHROPLASTY;  Surgeon: Sydnee Cabal, MD;  Location: [facility];  Service: Orthopedics;  Laterality: Left;  . TOTAL KNEE ARTHROPLASTY Right 05/15/2015   Procedure: RIGHT TOTAL KNEE ARTHROPLASTY;  Surgeon: Sydnee Cabal, MD;  Location: [facility];  Service: Orthopedics;  Laterality: Right;  . UTERINE FIBROID SURGERY      Social History   Socioeconomic History  . Marital status: Married    Spouse name: Not on file  . Number of children: Not on file  . Years of education: Not on file  . Highest education level: Not on file  Occupational History  . Not on file  Tobacco Use  . Smoking status: Never Smoker  . Smokeless tobacco: Never Used  Vaping Use  . Vaping Use: Never used  Substance and Sexual Activity  . Alcohol use: Yes  Comment: rare  . Drug use: No  . Sexual activity: Not on file  Other Topics Concern  . Not on file  Social History Narrative  . Not on file   Social Determinants of Health   Financial Resource Strain:   . Difficulty of Paying Living Expenses:   Food Insecurity:   . Worried About Charity fundraiser in the Last Year:   . Arboriculturist in the Last Year:   Transportation Needs:   . Film/video editor (Medical):   Marland Kitchen Lack of Transportation (Non-Medical):   Physical Activity:   . Days of Exercise per Week:    . Minutes of Exercise per Session:   Stress:   . Feeling of Stress :   Social Connections:   . Frequency of Communication with Friends and Family:   . Frequency of Social Gatherings with Friends and Family:   . Attends Religious Services:   . Active Member of Clubs or Organizations:   . Attends Archivist Meetings:   Marland Kitchen Marital Status:   Intimate Partner Violence:   . Fear of Current or Ex-Partner:   . Emotionally Abused:   Marland Kitchen Physically Abused:   . Sexually Abused:     Current Outpatient Medications on File Prior to Visit  Medication Sig Dispense Refill  . Accu-Chek Softclix Lancets lancets 1 each by Other route in the morning and at bedtime. E11.9 180 each 0  . acetaminophen (TYLENOL ARTHRITIS PAIN) 650 MG CR tablet Take 1,300 mg by mouth at bedtime.     Marland Kitchen aspirin EC 81 MG tablet Take 1 tablet (81 mg total) by mouth daily. 90 tablet 3  . Blood Glucose Monitoring Suppl (ACCU-CHEK GUIDE ME) w/Device KIT 1 each by Does not apply route 2 (two) times daily. E11.9 1 kit 0  . carvedilol (COREG) 3.125 MG tablet Take 1 tablet (3.125 mg total) by mouth 2 (two) times daily with a meal. 60 tablet 11  . Cholecalciferol (VITAMIN D) 2000 UNITS CAPS Take 2,000 Units by mouth every morning.     . clopidogrel (PLAVIX) 75 MG tablet Take 1 tablet (75 mg total) by mouth daily with breakfast. 30 tablet 11  . Coenzyme Q10 (COQ10) 200 MG CAPS Take 200 mg by mouth daily.     . diclofenac sodium (VOLTAREN) 1 % GEL Apply 1 application topically at bedtime as needed (knee pain.).     Marland Kitchen DULoxetine (CYMBALTA) 20 MG capsule Take 40 mg by mouth at bedtime.     . furosemide (LASIX) 40 MG tablet Take 1 tablet (40 mg total) by mouth 3 (three) times a week. 36 tablet 3  . glucose blood (ACCU-CHEK GUIDE) test strip 1 each by Other route 2 (two) times daily. E11.9 200 each 0  . Multiple Vitamin (MULTIVITAMIN WITH MINERALS) TABS tablet Take 1 tablet by mouth every morning.     . nitroGLYCERIN (NITROSTAT) 0.4 MG  SL tablet Place 1 tablet (0.4 mg total) under the tongue every 5 (five) minutes as needed for chest pain. 90 tablet 3  . Omega-3 Fatty Acids (RA FISH OIL) 1400 MG CPDR Take 1,400 mg by mouth 2 (two) times daily.    . pantoprazole (PROTONIX) 40 MG tablet Take 1 tablet (40 mg total) by mouth daily. 30 tablet 11  . potassium chloride SA (KLOR-CON) 20 MEQ tablet Take 1 tablet (20 mEq total) by mouth 3 (three) times a week. 36 tablet 3  . rosuvastatin (CRESTOR) 20 MG tablet Take by mouth  daily.     . saccharomyces boulardii (FLORASTOR) 250 MG capsule Take 250 mg by mouth daily.    . trospium (SANCTURA) 20 MG tablet Take 20 mg by mouth 2 (two) times daily.  3  . valsartan-hydrochlorothiazide (DIOVAN-HCT) 160-12.5 MG tablet Take 1 tablet by mouth daily.    . vitamin C (ASCORBIC ACID) 250 MG tablet Take 250 mg by mouth daily.     No current facility-administered medications on file prior to visit.    Allergies  Allergen Reactions  . Colchicine     Stomach cramps  . Diclofenac     Oral causes stomach cramps  . Janumet [Sitagliptin-Metformin Hcl] Nausea And Vomiting    Lightheaded   . Septra [Sulfamethoxazole-Trimethoprim] Itching  . Trulicity [Dulaglutide] Diarrhea and Nausea And Vomiting  . Zetia [Ezetimibe]     Muscle pain  . Imipramine Rash  . Tape Rash    Paper tape ok to use     Family History  Problem Relation Age of Onset  . Diabetes Maternal Grandfather   . Heart attack Maternal Grandfather   . Heart disease Mother   . Heart attack Father   . Heart attack Sister   . Heart attack Paternal Uncle   . Congenital heart disease Son     BP 110/72   Pulse 82   Ht 5' 3" (1.6 m)   Wt (!) 228 lb 12.8 oz (103.8 kg)   SpO2 98%   BMI 40.53 kg/m    Review of Systems She denies hypoglycemia    Objective:   Physical Exam VITAL SIGNS:  See vs page GENERAL: no distress Pulses: dorsalis pedis intact bilat.   MSK: no deformity of the feet CV: 1+ bilat leg edema Skin:  no ulcer  on the feet, but there are bilat heavy calluses.  normal color and temp on the feet. Neuro: sensation is intact to touch on the feet.    Lab Results  Component Value Date   HGBA1C 8.0 (A) 02/20/2020        Assessment & Plan:  Insulin-requiring type 2 DM, with CRI: she needs increased rx    Patient Instructions  Please increase the Toujeo to 140 units each morning.   check your blood sugar twice a day.  vary the time of day when you check, between before the 3 meals, and at bedtime.  also check if you have symptoms of your blood sugar being too high or too low.  please keep a record of the readings and bring it to your next appointment here (or you can bring the meter itself).  You can write it on any piece of paper.  please call us sooner if your blood sugar goes below 70, or if you have a lot of readings over 200. Please come back for a follow-up appointment in 2-3 months.

## 2020-02-21 ENCOUNTER — Encounter (HOSPITAL_BASED_OUTPATIENT_CLINIC_OR_DEPARTMENT_OTHER): Payer: Self-pay | Admitting: Cardiovascular Disease

## 2020-02-21 DIAGNOSIS — K219 Gastro-esophageal reflux disease without esophagitis: Secondary | ICD-10-CM | POA: Diagnosis not present

## 2020-02-21 DIAGNOSIS — Z7982 Long term (current) use of aspirin: Secondary | ICD-10-CM | POA: Diagnosis not present

## 2020-02-21 DIAGNOSIS — Z955 Presence of coronary angioplasty implant and graft: Secondary | ICD-10-CM | POA: Diagnosis not present

## 2020-02-21 DIAGNOSIS — Z79899 Other long term (current) drug therapy: Secondary | ICD-10-CM | POA: Diagnosis not present

## 2020-02-21 DIAGNOSIS — E119 Type 2 diabetes mellitus without complications: Secondary | ICD-10-CM | POA: Diagnosis not present

## 2020-02-21 DIAGNOSIS — I2510 Atherosclerotic heart disease of native coronary artery without angina pectoris: Secondary | ICD-10-CM | POA: Diagnosis not present

## 2020-02-21 NOTE — Procedures (Signed)
Patient Name: Pamela Love, Herbig Date: 01/31/2020 Gender: Female D.O.B: 1950-10-30 Age (years): 19 Referring Provider: Godfrey Pick Tobb DO Height (inches): 63 Interpreting Physician: Shelva Majestic MD, ABSM Weight (lbs): 232 RPSGT: Earney [place] BMI: 41 MRN: [id number] Neck Size: 16.00  CLINICAL INFORMATION Sleep Study Type: NPSG  Indication for sleep study: Snoring  Epworth Sleepiness Score: 8  SLEEP STUDY TECHNIQUE As per the AASM Manual for the Scoring of Sleep and Associated Events v2.3 (April 2016) with a hypopnea requiring 4% desaturations.  The channels recorded and monitored were frontal, central and occipital EEG, electrooculogram (EOG), submentalis EMG (chin), nasal and oral airflow, thoracic and abdominal wall motion, anterior tibialis EMG, snore microphone, electrocardiogram, and pulse oximetry.  MEDICATIONS acetaminophen (TYLENOL ARTHRITIS PAIN) 650 MG CR tablet aspirin EC 81 MG tablet Blood Glucose Monitoring Suppl (ACCU-CHEK GUIDE ME) w/Device KIT carvedilol (COREG) 3.125 MG tablet Cholecalciferol (VITAMIN D) 2000 UNITS CAPS clopidogrel (PLAVIX) 75 MG tablet Coenzyme Q10 (COQ10) 200 MG CAPS diclofenac sodium (VOLTAREN) 1 % GEL DULoxetine (CYMBALTA) 20 MG capsule furosemide (LASIX) 40 MG tablet glucose blood (ACCU-CHEK GUIDE) test strip insulin glargine, 2 Unit Dial, (TOUJEO MAX SOLOSTAR) 300 UNIT/ML Solostar Pen Insulin Pen Needle (PEN NEEDLES) 32G X 5 MM MISC Multiple Vitamin (MULTIVITAMIN WITH MINERALS) TABS tablet nitroGLYCERIN (NITROSTAT) 0.4 MG SL tablet Omega-3 Fatty Acids (RA FISH OIL) 1400 MG CPDR pantoprazole (PROTONIX) 40 MG tablet potassium chloride SA (KLOR-CON) 20 MEQ tablet rosuvastatin (CRESTOR) 20 MG tablet saccharomyces boulardii (FLORASTOR) 250 MG capsule trospium (SANCTURA) 20 MG tablet valsartan-hydrochlorothiazide (DIOVAN-HCT) 160-12.5 MG tablet vitamin C (ASCORBIC ACID) 250 MG tablet Medications self-administered by patient  taken the night of the study : CARVEDILOL, FISH OIL, trospium, TYLENOL ALLERGY SINUS, DULOXETINE  SLEEP ARCHITECTURE The study was initiated at 10:54:31 PM and ended at 5:02:37 AM.  Sleep onset time was 61.9 minutes and the sleep efficiency was 60.0%%. The total sleep time was 220.7 minutes.  Stage REM latency was N/A minutes.  The patient spent 1.4%% of the night in stage N1 sleep, 98.6%% in stage N2 sleep, 0.0%% in stage N3 and 0% in REM.  Alpha intrusion was absent.  Supine sleep was 96.60%.  RESPIRATORY PARAMETERS The overall apnea/hypopnea index (AHI) was 18.2 per hour. The respiratory disturbance index (RDI) is 23.1/h. There were 1 total apneas, including 1 obstructive, 0 central and 0 mixed apneas. There were 66 hypopneas and 18 RERAs.  The AHI during Stage REM sleep was N/A per hour.  AHI while supine was 15.8 per hour.  The mean oxygen saturation was 92.6%. The minimum SpO2 during sleep was 84.0%.  Moderate snoring was noted during this study.  CARDIAC DATA The 2 lead EKG demonstrated sinus rhythm. The mean heart rate was 76.1 beats per minute. Other EKG findings include: isolated 5 beat burst of atrial tachycardia.  LEG MOVEMENT DATA The total PLMS were 0 with a resulting PLMS index of 0.0. Associated arousal with leg movement index was 0.0 .  IMPRESSIONS - Modederate obstructive sleep apnea overall (AHI 18.2/h; RDI 23.1/h) however REM sleep was not present and the overall assessment may be underestimated if REM sleep had occurred. - No significant central sleep apnea occurred during this study (CAI = 0.0/h). - Mild oxygen desaturation to a nadir of 84.0%. - The patient snored with moderate snoring volume. - No cardiac abnormalities were noted during this study. - Clinically significant periodic limb movements did not occur during sleep. No significant associated arousals.  DIAGNOSIS - Obstructive Sleep Apnea (G47.33) - Nocturnal  Hypoxemia  (G47.36)  RECOMMENDATIONS - Therapeutic CPAP titration to determine optimal pressure required to alleviate sleep disordered breathing. - Effort should be made to optimize nasal and oropharyngeal patency.  - Avoid alcohol, sedatives and other CNS depressants that may worsen sleep apnea and disrupt normal sleep architecture. - Sleep hygiene should be reviewed to assess factors that may improve sleep quality. - Weight management (BMI 41) and regular exercise should be initiated or continued if appropriate.  [Electronically signed] 02/21/2020 12:43 PM  Shelva Majestic MD, [facility], ABSM Diplomate, American Board of Sleep Medicine   NPI: [id number]  [place] PH: [phone]   FX: [phone] [place]

## 2020-02-24 ENCOUNTER — Other Ambulatory Visit: Payer: Self-pay | Admitting: Cardiovascular Disease

## 2020-02-24 DIAGNOSIS — G4733 Obstructive sleep apnea (adult) (pediatric): Secondary | ICD-10-CM

## 2020-02-24 DIAGNOSIS — I2510 Atherosclerotic heart disease of native coronary artery without angina pectoris: Secondary | ICD-10-CM | POA: Diagnosis not present

## 2020-02-24 DIAGNOSIS — Z955 Presence of coronary angioplasty implant and graft: Secondary | ICD-10-CM | POA: Diagnosis not present

## 2020-02-24 DIAGNOSIS — E119 Type 2 diabetes mellitus without complications: Secondary | ICD-10-CM | POA: Diagnosis not present

## 2020-02-24 DIAGNOSIS — Z7982 Long term (current) use of aspirin: Secondary | ICD-10-CM | POA: Diagnosis not present

## 2020-02-24 DIAGNOSIS — Z79899 Other long term (current) drug therapy: Secondary | ICD-10-CM | POA: Diagnosis not present

## 2020-02-24 DIAGNOSIS — K219 Gastro-esophageal reflux disease without esophagitis: Secondary | ICD-10-CM | POA: Diagnosis not present

## 2020-02-25 ENCOUNTER — Other Ambulatory Visit: Payer: Self-pay

## 2020-02-25 DIAGNOSIS — N1831 Chronic kidney disease, stage 3a: Secondary | ICD-10-CM

## 2020-02-25 MED ORDER — BD PEN NEEDLE NANO U/F 32G X 4 MM MISC: 1.0000 | Freq: Every day | 0 refills | Status: DC

## 2020-02-26 DIAGNOSIS — I2510 Atherosclerotic heart disease of native coronary artery without angina pectoris: Secondary | ICD-10-CM | POA: Diagnosis not present

## 2020-02-26 DIAGNOSIS — E119 Type 2 diabetes mellitus without complications: Secondary | ICD-10-CM | POA: Diagnosis not present

## 2020-02-26 DIAGNOSIS — Z7982 Long term (current) use of aspirin: Secondary | ICD-10-CM | POA: Diagnosis not present

## 2020-02-26 DIAGNOSIS — Z955 Presence of coronary angioplasty implant and graft: Secondary | ICD-10-CM | POA: Diagnosis not present

## 2020-02-26 DIAGNOSIS — Z79899 Other long term (current) drug therapy: Secondary | ICD-10-CM | POA: Diagnosis not present

## 2020-02-26 DIAGNOSIS — K219 Gastro-esophageal reflux disease without esophagitis: Secondary | ICD-10-CM | POA: Diagnosis not present

## 2020-02-27 DIAGNOSIS — D485 Neoplasm of uncertain behavior of skin: Secondary | ICD-10-CM | POA: Diagnosis not present

## 2020-02-27 DIAGNOSIS — C44722 Squamous cell carcinoma of skin of right lower limb, including hip: Secondary | ICD-10-CM | POA: Diagnosis not present

## 2020-02-27 DIAGNOSIS — L821 Other seborrheic keratosis: Secondary | ICD-10-CM | POA: Diagnosis not present

## 2020-02-27 DIAGNOSIS — L0291 Cutaneous abscess, unspecified: Secondary | ICD-10-CM | POA: Diagnosis not present

## 2020-02-28 DIAGNOSIS — E119 Type 2 diabetes mellitus without complications: Secondary | ICD-10-CM | POA: Diagnosis not present

## 2020-02-28 DIAGNOSIS — K219 Gastro-esophageal reflux disease without esophagitis: Secondary | ICD-10-CM | POA: Diagnosis not present

## 2020-02-28 DIAGNOSIS — Z955 Presence of coronary angioplasty implant and graft: Secondary | ICD-10-CM | POA: Diagnosis not present

## 2020-02-28 DIAGNOSIS — Z79899 Other long term (current) drug therapy: Secondary | ICD-10-CM | POA: Diagnosis not present

## 2020-02-28 DIAGNOSIS — I2510 Atherosclerotic heart disease of native coronary artery without angina pectoris: Secondary | ICD-10-CM | POA: Diagnosis not present

## 2020-02-28 DIAGNOSIS — Z7982 Long term (current) use of aspirin: Secondary | ICD-10-CM | POA: Diagnosis not present

## 2020-03-02 ENCOUNTER — Telehealth: Payer: Self-pay | Admitting: *Deleted

## 2020-03-02 DIAGNOSIS — K219 Gastro-esophageal reflux disease without esophagitis: Secondary | ICD-10-CM | POA: Diagnosis not present

## 2020-03-02 DIAGNOSIS — Z955 Presence of coronary angioplasty implant and graft: Secondary | ICD-10-CM | POA: Diagnosis not present

## 2020-03-02 DIAGNOSIS — I2510 Atherosclerotic heart disease of native coronary artery without angina pectoris: Secondary | ICD-10-CM | POA: Diagnosis not present

## 2020-03-02 DIAGNOSIS — Z79899 Other long term (current) drug therapy: Secondary | ICD-10-CM | POA: Diagnosis not present

## 2020-03-02 DIAGNOSIS — E119 Type 2 diabetes mellitus without complications: Secondary | ICD-10-CM | POA: Diagnosis not present

## 2020-03-02 DIAGNOSIS — Z7982 Long term (current) use of aspirin: Secondary | ICD-10-CM | POA: Diagnosis not present

## 2020-03-02 NOTE — Telephone Encounter (Signed)
Left message with husband call returned. Will call her again tomorrow to discuss sleep study results and recommendations.

## 2020-03-04 DIAGNOSIS — K219 Gastro-esophageal reflux disease without esophagitis: Secondary | ICD-10-CM | POA: Diagnosis not present

## 2020-03-04 DIAGNOSIS — I2510 Atherosclerotic heart disease of native coronary artery without angina pectoris: Secondary | ICD-10-CM | POA: Diagnosis not present

## 2020-03-04 DIAGNOSIS — E119 Type 2 diabetes mellitus without complications: Secondary | ICD-10-CM | POA: Diagnosis not present

## 2020-03-04 DIAGNOSIS — Z955 Presence of coronary angioplasty implant and graft: Secondary | ICD-10-CM | POA: Diagnosis not present

## 2020-03-04 DIAGNOSIS — Z79899 Other long term (current) drug therapy: Secondary | ICD-10-CM | POA: Diagnosis not present

## 2020-03-04 DIAGNOSIS — Z7982 Long term (current) use of aspirin: Secondary | ICD-10-CM | POA: Diagnosis not present

## 2020-03-05 NOTE — Telephone Encounter (Signed)
Patient is returning call to discuss sleep study results.

## 2020-03-06 ENCOUNTER — Ambulatory Visit: Payer: Medicare PPO | Admitting: Cardiology

## 2020-03-06 DIAGNOSIS — Z79899 Other long term (current) drug therapy: Secondary | ICD-10-CM | POA: Diagnosis not present

## 2020-03-06 DIAGNOSIS — Z7982 Long term (current) use of aspirin: Secondary | ICD-10-CM | POA: Diagnosis not present

## 2020-03-06 DIAGNOSIS — K219 Gastro-esophageal reflux disease without esophagitis: Secondary | ICD-10-CM | POA: Diagnosis not present

## 2020-03-06 DIAGNOSIS — E119 Type 2 diabetes mellitus without complications: Secondary | ICD-10-CM | POA: Diagnosis not present

## 2020-03-06 DIAGNOSIS — I2510 Atherosclerotic heart disease of native coronary artery without angina pectoris: Secondary | ICD-10-CM | POA: Diagnosis not present

## 2020-03-06 DIAGNOSIS — Z955 Presence of coronary angioplasty implant and graft: Secondary | ICD-10-CM | POA: Diagnosis not present

## 2020-03-06 NOTE — Telephone Encounter (Signed)
Patient is calling again to get her results from her sleep study. Please call back.

## 2020-03-09 ENCOUNTER — Telehealth: Payer: Self-pay | Admitting: *Deleted

## 2020-03-09 DIAGNOSIS — I2510 Atherosclerotic heart disease of native coronary artery without angina pectoris: Secondary | ICD-10-CM | POA: Diagnosis not present

## 2020-03-09 DIAGNOSIS — E119 Type 2 diabetes mellitus without complications: Secondary | ICD-10-CM | POA: Diagnosis not present

## 2020-03-09 DIAGNOSIS — Z79899 Other long term (current) drug therapy: Secondary | ICD-10-CM | POA: Diagnosis not present

## 2020-03-09 DIAGNOSIS — K219 Gastro-esophageal reflux disease without esophagitis: Secondary | ICD-10-CM | POA: Diagnosis not present

## 2020-03-09 DIAGNOSIS — Z955 Presence of coronary angioplasty implant and graft: Secondary | ICD-10-CM | POA: Diagnosis not present

## 2020-03-09 DIAGNOSIS — Z7982 Long term (current) use of aspirin: Secondary | ICD-10-CM | POA: Diagnosis not present

## 2020-03-09 NOTE — Telephone Encounter (Signed)
Left message called to discuss sleep study results and recommendations. Will call again later.

## 2020-03-11 ENCOUNTER — Telehealth: Payer: Self-pay | Admitting: Cardiology

## 2020-03-11 DIAGNOSIS — E119 Type 2 diabetes mellitus without complications: Secondary | ICD-10-CM | POA: Diagnosis not present

## 2020-03-11 DIAGNOSIS — I2510 Atherosclerotic heart disease of native coronary artery without angina pectoris: Secondary | ICD-10-CM | POA: Diagnosis not present

## 2020-03-11 DIAGNOSIS — K219 Gastro-esophageal reflux disease without esophagitis: Secondary | ICD-10-CM | POA: Diagnosis not present

## 2020-03-11 DIAGNOSIS — Z7982 Long term (current) use of aspirin: Secondary | ICD-10-CM | POA: Diagnosis not present

## 2020-03-11 DIAGNOSIS — Z955 Presence of coronary angioplasty implant and graft: Secondary | ICD-10-CM | POA: Diagnosis not present

## 2020-03-11 DIAGNOSIS — Z79899 Other long term (current) drug therapy: Secondary | ICD-10-CM | POA: Diagnosis not present

## 2020-03-11 NOTE — Telephone Encounter (Signed)
Patient had a sleep study test done 01/31/20. The patient has not been able to connect with Burna Mortimer to get the results of the sleep study test . She was hoping Dr. Servando Salina or her Nurse would be able to go over those results with her .  The patient has Cardiac Rehab MWF in the mornings from 9:30-11:30 and Burna Mortimer seems to call the patient while she is in rehab. The patient has other forms she needs to fill out for other doctors and needs to know the result of the sleep study test. Please advise

## 2020-03-13 DIAGNOSIS — K219 Gastro-esophageal reflux disease without esophagitis: Secondary | ICD-10-CM | POA: Diagnosis not present

## 2020-03-13 DIAGNOSIS — Z7982 Long term (current) use of aspirin: Secondary | ICD-10-CM | POA: Diagnosis not present

## 2020-03-13 DIAGNOSIS — E119 Type 2 diabetes mellitus without complications: Secondary | ICD-10-CM | POA: Diagnosis not present

## 2020-03-13 DIAGNOSIS — Z955 Presence of coronary angioplasty implant and graft: Secondary | ICD-10-CM | POA: Diagnosis not present

## 2020-03-13 DIAGNOSIS — I2510 Atherosclerotic heart disease of native coronary artery without angina pectoris: Secondary | ICD-10-CM | POA: Diagnosis not present

## 2020-03-13 DIAGNOSIS — Z79899 Other long term (current) drug therapy: Secondary | ICD-10-CM | POA: Diagnosis not present

## 2020-03-16 ENCOUNTER — Telehealth: Payer: Self-pay | Admitting: *Deleted

## 2020-03-16 ENCOUNTER — Other Ambulatory Visit: Payer: Self-pay | Admitting: Cardiovascular Disease

## 2020-03-16 DIAGNOSIS — L821 Other seborrheic keratosis: Secondary | ICD-10-CM | POA: Diagnosis not present

## 2020-03-16 DIAGNOSIS — L814 Other melanin hyperpigmentation: Secondary | ICD-10-CM | POA: Diagnosis not present

## 2020-03-16 DIAGNOSIS — Z85828 Personal history of other malignant neoplasm of skin: Secondary | ICD-10-CM | POA: Diagnosis not present

## 2020-03-16 DIAGNOSIS — L578 Other skin changes due to chronic exposure to nonionizing radiation: Secondary | ICD-10-CM | POA: Diagnosis not present

## 2020-03-16 DIAGNOSIS — C44722 Squamous cell carcinoma of skin of right lower limb, including hip: Secondary | ICD-10-CM | POA: Diagnosis not present

## 2020-03-16 DIAGNOSIS — L570 Actinic keratosis: Secondary | ICD-10-CM | POA: Diagnosis not present

## 2020-03-16 DIAGNOSIS — Z86018 Personal history of other benign neoplasm: Secondary | ICD-10-CM | POA: Diagnosis not present

## 2020-03-16 DIAGNOSIS — D225 Melanocytic nevi of trunk: Secondary | ICD-10-CM | POA: Diagnosis not present

## 2020-03-16 DIAGNOSIS — G4733 Obstructive sleep apnea (adult) (pediatric): Secondary | ICD-10-CM

## 2020-03-16 NOTE — Telephone Encounter (Signed)
PA submitted via web portal for CPAP titration. 

## 2020-03-16 NOTE — Telephone Encounter (Signed)
Returned a call to patient and discussed her sleep  Study results and recommendations. Patient voiced understanding and agrees to proceed with titration study. All questions was answered to her satisfaction. Titration PA submitted to [facility] via web portal.

## 2020-03-17 ENCOUNTER — Other Ambulatory Visit: Payer: Self-pay

## 2020-03-17 ENCOUNTER — Ambulatory Visit (INDEPENDENT_AMBULATORY_CARE_PROVIDER_SITE_OTHER): Payer: Medicare PPO | Admitting: Family Medicine

## 2020-03-17 ENCOUNTER — Encounter (INDEPENDENT_AMBULATORY_CARE_PROVIDER_SITE_OTHER): Payer: Self-pay | Admitting: Family Medicine

## 2020-03-17 VITALS — BP 120/77 | HR 84 | Temp 98.5°F | Ht 63.0 in | Wt 227.0 lb

## 2020-03-17 DIAGNOSIS — E1169 Type 2 diabetes mellitus with other specified complication: Secondary | ICD-10-CM | POA: Diagnosis not present

## 2020-03-17 DIAGNOSIS — E785 Hyperlipidemia, unspecified: Secondary | ICD-10-CM

## 2020-03-17 DIAGNOSIS — R5383 Other fatigue: Secondary | ICD-10-CM

## 2020-03-17 DIAGNOSIS — M1909 Primary osteoarthritis, other specified site: Secondary | ICD-10-CM | POA: Diagnosis not present

## 2020-03-17 DIAGNOSIS — I252 Old myocardial infarction: Secondary | ICD-10-CM | POA: Diagnosis not present

## 2020-03-17 DIAGNOSIS — Z794 Long term (current) use of insulin: Secondary | ICD-10-CM

## 2020-03-17 DIAGNOSIS — E1159 Type 2 diabetes mellitus with other circulatory complications: Secondary | ICD-10-CM

## 2020-03-17 DIAGNOSIS — G4733 Obstructive sleep apnea (adult) (pediatric): Secondary | ICD-10-CM

## 2020-03-17 DIAGNOSIS — R0602 Shortness of breath: Secondary | ICD-10-CM | POA: Diagnosis not present

## 2020-03-17 DIAGNOSIS — Z6841 Body Mass Index (BMI) 40.0 and over, adult: Secondary | ICD-10-CM

## 2020-03-17 DIAGNOSIS — I10 Essential (primary) hypertension: Secondary | ICD-10-CM

## 2020-03-17 DIAGNOSIS — I152 Hypertension secondary to endocrine disorders: Secondary | ICD-10-CM

## 2020-03-17 DIAGNOSIS — Z1331 Encounter for screening for depression: Secondary | ICD-10-CM

## 2020-03-17 DIAGNOSIS — Z0289 Encounter for other administrative examinations: Secondary | ICD-10-CM

## 2020-03-17 DIAGNOSIS — E66813 Obesity, class 3: Secondary | ICD-10-CM

## 2020-03-17 NOTE — Progress Notes (Signed)
Dear Dr. Clelia CroftShaw,   Thank you for referring Pamela Love to our clinic. The following note includes my evaluation and treatment recommendations.  Chief Complaint:   OBESITY Pamela Love Kneece (MR# [id number]) is a 69 y.o. female who presents for evaluation and treatment of obesity and related comorbidities. Current BMI is Body mass index is 40.21 kg/m. Pamela Love has been struggling with her weight for many years and has been unsuccessful in either losing weight, maintaining weight loss, or reaching her healthy weight goal.  Pamela Love is currently in the action stage of change and ready to dedicate time achieving and maintaining a healthier weight. Pamela Love is interested in becoming our patient and working on intensive lifestyle modifications including (but not limited to) diet and exercise for weight loss.  See blood sugar log (scanned).  Pamela Love provided the following food recall today:  Breakfast:  Fruit, yogurt. Lunch:  Sandwich with meat and cheese, 1/2 banana, puff corns. Dinner:  Meat, 1/2 baked potato, vegetables. Snacks:  Nuts.  [name]'s habits were reviewed today and are as follows: Her family eats meals together, she thinks her family will eat healthier with her, her desired weight loss is 47 pounds, she has been heavy most of her life, she started gaining weight after having her second child, her heaviest weight ever was 260 pounds, she craves sweets such as cake, cookies, and chocolate, she is frequently drinking liquids with calories, she frequently makes poor food choices, she frequently eats larger portions than normal and she struggles with emotional eating.  Depression Screen Pamela Love's Food and Mood (modified PHQ-9) score was 5.  Depression screen PHQ 2/9 03/17/2020  Decreased Interest 1  Down, Depressed, Hopeless 1  PHQ - 2 Score 2  Altered sleeping 0  Tired, decreased energy 1  Change in appetite 1  Feeling bad or failure about yourself  1  Trouble concentrating 0   Moving slowly or fidgety/restless 0  Suicidal thoughts 0  PHQ-9 Score 5  Difficult doing work/chores Not difficult at all   Subjective:   1. Other fatigue Pamela Love admits to daytime somnolence and reports waking up still tired. Patent has a history of symptoms of daytime fatigue, morning fatigue and snoring. Pamela Love generally gets 9 hours of sleep per night, and states that she has generally restful sleep. Snoring is present. Apneic episodes are present. Epworth Sleepiness Score is 8.  Pamela Love has the diagnosis of moderate sleep apnea.  2. SOB (shortness of breath) on exertion Shine notes increasing shortness of breath with exercising and seems to be worsening over time with weight gain. She notes getting out of breath sooner with activity than she used to. This has not gotten worse recently. Pamela Love denies shortness of breath at rest or orthopnea.  3. Type 2 diabetes mellitus with other specified complication, with long-term current use of insulin (HCC) Medications reviewed. Diabetic ROS: no polyuria or polydipsia, no chest pain, dyspnea or TIA's, no numbness, tingling or pain in extremities.  She was diagnosed in 2012.  She sees Dr. [name].  She has been on insulin for the past few years.  She is on Toujeo 140 units in the morning.  She did not tolerate Trulicity or Janumet.   Lab Results  Component Value Date   HGBA1C 8.0 (A) 02/20/2020   HGBA1C 7.7 (A) 10/07/2019   HGBA1C 7.3 (A) 05/14/2019   Lab Results  Component Value Date   CREATININE 0.94 01/07/2020   4. Hypertension associated with type 2 diabetes mellitus (HCC)  Review: taking medications as instructed, no medication side effects noted, no chest pain on exertion, no swelling of ankles.   BP Readings from Last 3 Encounters:  03/17/20 120/77  02/20/20 110/72  02/04/20 122/70   5. Hyperlipidemia associated with type 2 diabetes mellitus (HCC) Lakeyia has hyperlipidemia and has been trying to improve her cholesterol levels with  intensive lifestyle modification including a low saturated fat diet, exercise and weight loss. She denies any chest pain, claudication or myalgias.  6. Osteoarthritis of other site, unspecified osteoarthritis type Delayne has osteoarthritis and takes Cymbalta, which she says is helpful.  7. OSA (obstructive sleep apnea) Leighana has a diagnosis of sleep apnea. She reports that she is not using a CPAP regularly.   8. History of MI (myocardial infarction) Xuan had an MI on 01/06/2020.  She is on Plavix, Carvedilol, and is doing cardiac rehab.  9. Encounter for screening for depression Angelene was screened for depression as part of her new patient workup.  Assessment/Plan:   1. Other fatigue Pamela Love does feel that her weight is causing her energy to be lower than it should be. Fatigue may be related to obesity, depression or many other causes. Labs will be ordered, and in the meanwhile, Pamela Love will focus on self care including making healthy food choices, increasing physical activity and focusing on stress reduction.  - EKG 12-Lead  2. SOB (shortness of breath) on exertion Pamela Love does not feel that she gets out of breath more easily that she used to when she exercises. Pamela Love's shortness of breath appears to be obesity related and exercise induced. She has agreed to work on weight loss and gradually increase exercise to treat her exercise induced shortness of breath. Will continue to monitor closely.  3. Type 2 diabetes mellitus with other specified complication, with long-term current use of insulin (HCC) Good blood sugar control is important to decrease the likelihood of diabetic complications such as nephropathy, neuropathy, limb loss, blindness, coronary artery disease, and death. Intensive lifestyle modification including diet, exercise and weight loss are the first line of treatment for diabetes.   4. Hypertension associated with type 2 diabetes mellitus (HCC) Pamela Love is working on healthy weight  loss and exercise to improve blood pressure control. We will watch for signs of hypotension as she continues her lifestyle modifications.  5. Hyperlipidemia associated with type 2 diabetes mellitus (HCC) Cardiovascular risk and specific lipid/LDL goals reviewed.  We discussed several lifestyle modifications today and Vernetta will continue to work on diet, exercise and weight loss efforts. Orders and follow up as documented in patient record.   Counseling Intensive lifestyle modifications are the first line treatment for this issue. . Dietary changes: Increase soluble fiber. Decrease simple carbohydrates. . Exercise changes: Moderate to vigorous-intensity aerobic activity 150 minutes per week if tolerated. . Lipid-lowering medications: see documented in medical record.  6. Osteoarthritis of other site, unspecified osteoarthritis type Will continue to monitor.  7. OSA (obstructive sleep apnea) Intensive lifestyle modifications are the first line treatment for this issue. We discussed several lifestyle modifications today and she will continue to work on diet, exercise and weight loss efforts. We will continue to monitor. Orders and follow up as documented in patient record.   Counseling  Sleep apnea is a condition in which breathing pauses or becomes shallow during sleep. This happens over and over during the night. This disrupts your sleep and keeps your body from getting the rest that it needs, which can cause tiredness and lack of energy (  fatigue) during the day.  Sleep apnea treatment: If you were given a device to open your airway while you sleep, USE IT!  Sleep hygiene:   Limit or avoid alcohol, caffeinated beverages, and cigarettes, especially close to bedtime.   Do not eat a large meal or eat spicy foods right before bedtime. This can lead to digestive discomfort that can make it hard for you to sleep.  Keep a sleep diary to help you and your health care provider figure out what  could be causing your insomnia.  . Make your bedroom a dark, comfortable place where it is easy to fall asleep. ? Put up shades or blackout curtains to block light from outside. ? Use a white noise machine to block noise. ? Keep the temperature cool. . Limit screen use before bedtime. This includes: ? Watching TV. ? Using your smartphone, tablet, or computer. . Stick to a routine that includes going to bed and waking up at the same times every day and night. This can help you fall asleep faster. Consider making a quiet activity, such as reading, part of your nighttime routine. . Try to avoid taking naps during the day so that you sleep better at night. . Get out of bed if you are still awake after 15 minutes of trying to sleep. Keep the lights down, but try reading or doing a quiet activity. When you feel sleepy, go back to bed.  8. History of MI (myocardial infarction) Followed by Cardiology for this problem. Those encounter notes were reviewed.  9. Encounter for screening for depression Ayanna had a positive depression screening. Depression is commonly associated with obesity and often results in emotional eating behaviors. We will monitor this closely and work on CBT to help improve the non-hunger eating patterns. Referral to Psychology may be required if no improvement is seen as she continues in our clinic.  10. Class 3 severe obesity with serious comorbidity and body mass index (BMI) of 40.0 to 44.9 in adult, unspecified obesity type (HCC) Kobie is currently in the action stage of change and her goal is to continue with weight loss efforts. I recommend Vanassa begin the structured treatment plan as follows:  She has agreed to the Category 2 Plan.  Exercise goals: No exercise has been prescribed at this time.   Behavioral modification strategies: increasing lean protein intake, decreasing simple carbohydrates, increasing vegetables, increasing water intake, decreasing liquid calories,  decreasing sodium intake and increasing high fiber foods.  She was informed of the importance of frequent follow-up visits to maximize her success with intensive lifestyle modifications for her multiple health conditions. She was informed we would discuss her lab results at her next visit unless there is a critical issue that needs to be addressed sooner. Greenly agreed to keep her next visit at the agreed upon time to discuss these results.  Objective:   Blood pressure 120/77, pulse 84, temperature 98.5 F (36.9 C), temperature source Oral, height 5\' 3"  (1.6 m), weight 227 lb (103 kg), SpO2 98 %. Body mass index is 40.21 kg/m.  EKG: Normal sinus rhythm, rate 81 bpm.  Indirect Calorimeter completed today shows a VO2 of 202 and a REE of 1413.  Her calculated basal metabolic rate is thus her basal metabolic rate is worse than expected.  General: Cooperative, alert, well developed, in no acute distress. HEENT: Conjunctivae and lids unremarkable. Cardiovascular: Regular rhythm.  Lungs: Normal work of breathing. Neurologic: No focal deficits.   Lab Results  Component  Value Date   CREATININE 0.94 01/07/2020   BUN 13 01/07/2020   NA 140 01/07/2020   K 3.9 01/07/2020   CL 102 01/07/2020   CO2 29 01/07/2020   Lab Results  Component Value Date   HGBA1C 8.0 (A) 02/20/2020   HGBA1C 7.7 (A) 10/07/2019   HGBA1C 7.3 (A) 05/14/2019   HGBA1C 9.0 (A) 09/19/2018   HGBA1C 8.5 (A) 07/16/2018   Lab Results  Component Value Date   TSH 1.71 05/30/2018   Lab Results  Component Value Date   WBC 7.1 01/07/2020   HGB 13.2 01/07/2020   HCT 39.1 01/07/2020   MCV 90.3 01/07/2020   PLT 225 01/07/2020   Attestation Statements:   This is the patient's first visit at [facility]. The patient's NEW PATIENT PACKET was reviewed at length. Included in the packet: current and past health history, medications, allergies, ROS, gynecologic history (women only), surgical history, family  history, social history, weight history, weight loss surgery history (for those that have had weight loss surgery), nutritional evaluation, mood and food questionnaire, PHQ9, Epworth questionnaire, sleep habits questionnaire, patient life and health improvement goals questionnaire. These will all be scanned into the patient's chart under media.   During the visit, I independently reviewed the patient's EKG, bioimpedance scale results, and indirect calorimeter results. I used this information to tailor a meal plan for the patient that will help her to lose weight and will improve her obesity-related conditions going forward. I performed a medically necessary appropriate examination and/or evaluation. I discussed the assessment and treatment plan with the patient. The patient was provided an opportunity to ask questions and all were answered. The patient agreed with the plan and demonstrated an understanding of the instructions. Labs were ordered at this visit and will be reviewed at the next visit unless more critical results need to be addressed immediately. Clinical information was updated and documented in the EMR.   Time spent on visit including pre-visit chart review and post-visit charting and care was 62 minutes.   I, Insurance claims handler, CMA, am acting as transcriptionist for Helane Rima, DO  I have reviewed the above documentation for accuracy and completeness, and I agree with the above. Helane Rima, DO

## 2020-03-23 DIAGNOSIS — Z79899 Other long term (current) drug therapy: Secondary | ICD-10-CM | POA: Diagnosis not present

## 2020-03-23 DIAGNOSIS — K219 Gastro-esophageal reflux disease without esophagitis: Secondary | ICD-10-CM | POA: Diagnosis not present

## 2020-03-23 DIAGNOSIS — Z7982 Long term (current) use of aspirin: Secondary | ICD-10-CM | POA: Diagnosis not present

## 2020-03-23 DIAGNOSIS — Z955 Presence of coronary angioplasty implant and graft: Secondary | ICD-10-CM | POA: Diagnosis not present

## 2020-03-23 DIAGNOSIS — E119 Type 2 diabetes mellitus without complications: Secondary | ICD-10-CM | POA: Diagnosis not present

## 2020-03-23 DIAGNOSIS — I2510 Atherosclerotic heart disease of native coronary artery without angina pectoris: Secondary | ICD-10-CM | POA: Diagnosis not present

## 2020-03-24 ENCOUNTER — Telehealth: Payer: Self-pay | Admitting: *Deleted

## 2020-03-24 NOTE — Telephone Encounter (Signed)
Staff message sent to [facility] Auth received. Ok to schedule sleep study. [facility] Auth # [id number]. Valid dates 03/30/20 to 04/29/20.

## 2020-03-25 DIAGNOSIS — E119 Type 2 diabetes mellitus without complications: Secondary | ICD-10-CM | POA: Diagnosis not present

## 2020-03-25 DIAGNOSIS — Z79899 Other long term (current) drug therapy: Secondary | ICD-10-CM | POA: Diagnosis not present

## 2020-03-25 DIAGNOSIS — K219 Gastro-esophageal reflux disease without esophagitis: Secondary | ICD-10-CM | POA: Diagnosis not present

## 2020-03-25 DIAGNOSIS — I2510 Atherosclerotic heart disease of native coronary artery without angina pectoris: Secondary | ICD-10-CM | POA: Diagnosis not present

## 2020-03-25 DIAGNOSIS — Z7982 Long term (current) use of aspirin: Secondary | ICD-10-CM | POA: Diagnosis not present

## 2020-03-25 DIAGNOSIS — Z955 Presence of coronary angioplasty implant and graft: Secondary | ICD-10-CM | POA: Diagnosis not present

## 2020-03-26 ENCOUNTER — Encounter (INDEPENDENT_AMBULATORY_CARE_PROVIDER_SITE_OTHER): Payer: Self-pay | Admitting: Family Medicine

## 2020-03-27 DIAGNOSIS — Z7982 Long term (current) use of aspirin: Secondary | ICD-10-CM | POA: Diagnosis not present

## 2020-03-27 DIAGNOSIS — Z955 Presence of coronary angioplasty implant and graft: Secondary | ICD-10-CM | POA: Diagnosis not present

## 2020-03-27 DIAGNOSIS — Z79899 Other long term (current) drug therapy: Secondary | ICD-10-CM | POA: Diagnosis not present

## 2020-03-27 DIAGNOSIS — K219 Gastro-esophageal reflux disease without esophagitis: Secondary | ICD-10-CM | POA: Diagnosis not present

## 2020-03-27 DIAGNOSIS — E119 Type 2 diabetes mellitus without complications: Secondary | ICD-10-CM | POA: Diagnosis not present

## 2020-03-27 DIAGNOSIS — I2510 Atherosclerotic heart disease of native coronary artery without angina pectoris: Secondary | ICD-10-CM | POA: Diagnosis not present

## 2020-03-30 DIAGNOSIS — K219 Gastro-esophageal reflux disease without esophagitis: Secondary | ICD-10-CM | POA: Diagnosis not present

## 2020-03-30 DIAGNOSIS — Z79899 Other long term (current) drug therapy: Secondary | ICD-10-CM | POA: Diagnosis not present

## 2020-03-30 DIAGNOSIS — I2510 Atherosclerotic heart disease of native coronary artery without angina pectoris: Secondary | ICD-10-CM | POA: Diagnosis not present

## 2020-03-30 DIAGNOSIS — Z7982 Long term (current) use of aspirin: Secondary | ICD-10-CM | POA: Diagnosis not present

## 2020-03-30 DIAGNOSIS — Z955 Presence of coronary angioplasty implant and graft: Secondary | ICD-10-CM | POA: Diagnosis not present

## 2020-03-30 DIAGNOSIS — E119 Type 2 diabetes mellitus without complications: Secondary | ICD-10-CM | POA: Diagnosis not present

## 2020-03-31 ENCOUNTER — Other Ambulatory Visit: Payer: Self-pay

## 2020-03-31 ENCOUNTER — Ambulatory Visit (INDEPENDENT_AMBULATORY_CARE_PROVIDER_SITE_OTHER): Payer: Medicare PPO | Admitting: Family Medicine

## 2020-03-31 ENCOUNTER — Encounter (INDEPENDENT_AMBULATORY_CARE_PROVIDER_SITE_OTHER): Payer: Self-pay | Admitting: Family Medicine

## 2020-03-31 VITALS — BP 109/72 | HR 83 | Temp 98.4°F | Ht 63.0 in | Wt 227.0 lb

## 2020-03-31 DIAGNOSIS — E1169 Type 2 diabetes mellitus with other specified complication: Secondary | ICD-10-CM

## 2020-03-31 DIAGNOSIS — Z794 Long term (current) use of insulin: Secondary | ICD-10-CM

## 2020-03-31 DIAGNOSIS — Z6841 Body Mass Index (BMI) 40.0 and over, adult: Secondary | ICD-10-CM

## 2020-03-31 MED ORDER — VICTOZA 18 MG/3ML ~~LOC~~ SOPN: 1.8000 mg | PEN_INJECTOR | Freq: Every day | SUBCUTANEOUS | 0 refills | Status: DC

## 2020-03-31 MED ORDER — BD PEN NEEDLE NANO U/F 32G X 4 MM MISC: 1.0000 | Freq: Every day | 0 refills | Status: DC

## 2020-04-01 DIAGNOSIS — I2510 Atherosclerotic heart disease of native coronary artery without angina pectoris: Secondary | ICD-10-CM | POA: Diagnosis not present

## 2020-04-01 DIAGNOSIS — E119 Type 2 diabetes mellitus without complications: Secondary | ICD-10-CM | POA: Diagnosis not present

## 2020-04-01 DIAGNOSIS — Z79899 Other long term (current) drug therapy: Secondary | ICD-10-CM | POA: Diagnosis not present

## 2020-04-01 DIAGNOSIS — Z955 Presence of coronary angioplasty implant and graft: Secondary | ICD-10-CM | POA: Diagnosis not present

## 2020-04-01 DIAGNOSIS — K219 Gastro-esophageal reflux disease without esophagitis: Secondary | ICD-10-CM | POA: Diagnosis not present

## 2020-04-01 DIAGNOSIS — Z7982 Long term (current) use of aspirin: Secondary | ICD-10-CM | POA: Diagnosis not present

## 2020-04-01 NOTE — Progress Notes (Signed)
Chief Complaint:   OBESITY Enslee is here to discuss her progress with her obesity treatment plan along with follow-up of her obesity related diagnoses. Khaleesi is on the Category 2 Plan and states she is following her eating plan approximately 50% of the time. Mirenda states she is doing cardio and strength training for 30+ minutes 3 times per week.  Today's visit was #: 2 Starting weight: 227 lbs Starting date: 03/17/2020 Today's weight: 227 lbs Today's date: 03/31/2020 Total lbs lost to date: 0 Total lbs lost since last in-office visit: 0  Interim History: Charlott provided the following food recall today:  Breakfast (9am):  Fruit and yogurt.  At 10 am, she exercises. Lunch (12:30 pm):  Sandwich with chicken, cheese, reduced calorie bread.  Half a banana.  She says she is hungry before lunch. Snack:  Puffs. Dinner:  Meat (4-5 ounces).  Hard to get in vegetables. Snack:  Yasso. She says she craves chocolate.   Blood sugar this morning prior to eating was 105.  Assessment/Plan:   1. Type 2 diabetes mellitus with other specified complication, with long-term current use of insulin (HCC) A1c is 8.0.  Not at goal.  History of nausea with Trulicity.  Will start her on Victoza, as per below.  Discussed nausea, vomiting, diarrhea side effect potential.  Will start low and increase very slowly.  Will trial GLP-1 receptor agonist. Clinical data have revealed that these therapies improve glycemic control while reducing body weight and systolic blood pressure in patients with type 2 diabetes. Furthermore, incidence of hypoglycemia is relatively low with these treatments (except when used in combination with a sulfonylurea) because of their glucose-dependent mechanism of action. GLP-1 receptor agonists are generally well tolerated, with transient nausea being the most frequently reported adverse effect.  -Start liraglutide (VICTOZA) 18 MG/3ML SOPN; Inject 0.3 mLs (1.8 mg total) into the skin daily.   Dispense: 9 mL; Refill: 0 - Insulin Pen Needle (BD PEN NEEDLE NANO U/F) 32G X 4 MM MISC; 1 each by Does not apply route daily.  Dispense: 100 each; Refill: 0  2. Class 3 severe obesity with serious comorbidity and body mass index (BMI) of 40.0 to 44.9 in adult, unspecified obesity type (HCC) Akita is currently in the action stage of change. As such, her goal is to continue with weight loss efforts. She has agreed to the Category 2 Plan with breakfast options reviewed.  Exercise goals: For substantial health benefits, adults should do at least 150 minutes (2 hours and 30 minutes) a week of moderate-intensity, or 75 minutes (1 hour and 15 minutes) a week of vigorous-intensity aerobic physical activity, or an equivalent combination of moderate- and vigorous-intensity aerobic activity. Aerobic activity should be performed in episodes of at least 10 minutes, and preferably, it should be spread throughout the week.  Behavioral modification strategies: increasing lean protein intake.  Helena has agreed to follow-up with our clinic in 2-3 weeks. She was informed of the importance of frequent follow-up visits to maximize her success with intensive lifestyle modifications for her multiple health conditions.   Objective:   Blood pressure 109/72, pulse 83, temperature 98.4 F (36.9 C), temperature source Oral, height 5\' 3"  (1.6 m), weight 227 lb (103 kg), SpO2 96 %. Body mass index is 40.21 kg/m.  General: Cooperative, alert, well developed, in no acute distress. HEENT: Conjunctivae and lids unremarkable. Cardiovascular: Regular rhythm.  Lungs: Normal work of breathing. Neurologic: No focal deficits.   Lab Results  Component Value Date  CREATININE 0.94 01/07/2020   BUN 13 01/07/2020   NA 140 01/07/2020   K 3.9 01/07/2020   CL 102 01/07/2020   CO2 29 01/07/2020   Lab Results  Component Value Date   HGBA1C 8.0 (A) 02/20/2020   HGBA1C 7.7 (A) 10/07/2019   HGBA1C 7.3 (A) 05/14/2019   HGBA1C  9.0 (A) 09/19/2018   HGBA1C 8.5 (A) 07/16/2018   Lab Results  Component Value Date   TSH 1.71 05/30/2018   Lab Results  Component Value Date   WBC 7.1 01/07/2020   HGB 13.2 01/07/2020   HCT 39.1 01/07/2020   MCV 90.3 01/07/2020   PLT 225 01/07/2020   Obesity Behavioral Intervention:   Approximately 15 minutes were spent on the discussion below.  ASK: We discussed the diagnosis of obesity with Lavaun today and Shaneen agreed to give [place] permission to discuss obesity behavioral modification therapy today.  ASSESS: Tiahna has the diagnosis of obesity and her BMI today is 40.3. Micole is in the action stage of change.   ADVISE: Janiqua was educated on the multiple health risks of obesity as well as the benefit of weight loss to improve her health. She was advised of the need for long term treatment and the importance of lifestyle modifications to improve her current health and to decrease her risk of future health problems.  AGREE: Multiple dietary modification options and treatment options were discussed and Adiana agreed to follow the recommendations documented in the above note.  ARRANGE: Laretha was educated on the importance of frequent visits to treat obesity as outlined per CMS and USPSTF guidelines and agreed to schedule her next follow up appointment today.  Attestation Statements:   Reviewed by clinician on day of visit: allergies, medications, problem list, medical history, surgical history, family history, social history, and previous encounter notes.  I, Insurance claims handler, CMA, am acting as transcriptionist for Helane Rima, DO  I have reviewed the above documentation for accuracy and completeness, and I agree with the above. Helane Rima, DO

## 2020-04-03 DIAGNOSIS — Z955 Presence of coronary angioplasty implant and graft: Secondary | ICD-10-CM | POA: Diagnosis not present

## 2020-04-03 DIAGNOSIS — E119 Type 2 diabetes mellitus without complications: Secondary | ICD-10-CM | POA: Diagnosis not present

## 2020-04-03 DIAGNOSIS — Z7982 Long term (current) use of aspirin: Secondary | ICD-10-CM | POA: Diagnosis not present

## 2020-04-03 DIAGNOSIS — Z79899 Other long term (current) drug therapy: Secondary | ICD-10-CM | POA: Diagnosis not present

## 2020-04-03 DIAGNOSIS — I2510 Atherosclerotic heart disease of native coronary artery without angina pectoris: Secondary | ICD-10-CM | POA: Diagnosis not present

## 2020-04-03 DIAGNOSIS — K219 Gastro-esophageal reflux disease without esophagitis: Secondary | ICD-10-CM | POA: Diagnosis not present

## 2020-04-05 ENCOUNTER — Encounter (INDEPENDENT_AMBULATORY_CARE_PROVIDER_SITE_OTHER): Payer: Self-pay | Admitting: Family Medicine

## 2020-04-05 DIAGNOSIS — Z794 Long term (current) use of insulin: Secondary | ICD-10-CM

## 2020-04-06 ENCOUNTER — Encounter (INDEPENDENT_AMBULATORY_CARE_PROVIDER_SITE_OTHER): Payer: Self-pay | Admitting: Family Medicine

## 2020-04-07 DIAGNOSIS — E119 Type 2 diabetes mellitus without complications: Secondary | ICD-10-CM | POA: Diagnosis not present

## 2020-04-07 DIAGNOSIS — Z7982 Long term (current) use of aspirin: Secondary | ICD-10-CM | POA: Diagnosis not present

## 2020-04-07 DIAGNOSIS — Z79899 Other long term (current) drug therapy: Secondary | ICD-10-CM | POA: Diagnosis not present

## 2020-04-07 DIAGNOSIS — Z955 Presence of coronary angioplasty implant and graft: Secondary | ICD-10-CM | POA: Diagnosis not present

## 2020-04-07 DIAGNOSIS — K219 Gastro-esophageal reflux disease without esophagitis: Secondary | ICD-10-CM | POA: Diagnosis not present

## 2020-04-07 DIAGNOSIS — I2510 Atherosclerotic heart disease of native coronary artery without angina pectoris: Secondary | ICD-10-CM | POA: Diagnosis not present

## 2020-04-07 MED ORDER — BD PEN NEEDLE NANO U/F 32G X 4 MM MISC: 1.0000 | Freq: Every day | 0 refills | Status: DC

## 2020-04-08 DIAGNOSIS — Z79899 Other long term (current) drug therapy: Secondary | ICD-10-CM | POA: Diagnosis not present

## 2020-04-08 DIAGNOSIS — Z7982 Long term (current) use of aspirin: Secondary | ICD-10-CM | POA: Diagnosis not present

## 2020-04-08 DIAGNOSIS — Z955 Presence of coronary angioplasty implant and graft: Secondary | ICD-10-CM | POA: Diagnosis not present

## 2020-04-08 DIAGNOSIS — I2510 Atherosclerotic heart disease of native coronary artery without angina pectoris: Secondary | ICD-10-CM | POA: Diagnosis not present

## 2020-04-08 DIAGNOSIS — E119 Type 2 diabetes mellitus without complications: Secondary | ICD-10-CM | POA: Diagnosis not present

## 2020-04-08 DIAGNOSIS — K219 Gastro-esophageal reflux disease without esophagitis: Secondary | ICD-10-CM | POA: Diagnosis not present

## 2020-04-10 DIAGNOSIS — E119 Type 2 diabetes mellitus without complications: Secondary | ICD-10-CM | POA: Diagnosis not present

## 2020-04-10 DIAGNOSIS — I2510 Atherosclerotic heart disease of native coronary artery without angina pectoris: Secondary | ICD-10-CM | POA: Diagnosis not present

## 2020-04-10 DIAGNOSIS — K219 Gastro-esophageal reflux disease without esophagitis: Secondary | ICD-10-CM | POA: Diagnosis not present

## 2020-04-10 DIAGNOSIS — Z79899 Other long term (current) drug therapy: Secondary | ICD-10-CM | POA: Diagnosis not present

## 2020-04-10 DIAGNOSIS — Z7982 Long term (current) use of aspirin: Secondary | ICD-10-CM | POA: Diagnosis not present

## 2020-04-10 DIAGNOSIS — Z955 Presence of coronary angioplasty implant and graft: Secondary | ICD-10-CM | POA: Diagnosis not present

## 2020-04-13 DIAGNOSIS — E119 Type 2 diabetes mellitus without complications: Secondary | ICD-10-CM | POA: Diagnosis not present

## 2020-04-13 DIAGNOSIS — I2510 Atherosclerotic heart disease of native coronary artery without angina pectoris: Secondary | ICD-10-CM | POA: Diagnosis not present

## 2020-04-13 DIAGNOSIS — K219 Gastro-esophageal reflux disease without esophagitis: Secondary | ICD-10-CM | POA: Diagnosis not present

## 2020-04-13 DIAGNOSIS — Z7982 Long term (current) use of aspirin: Secondary | ICD-10-CM | POA: Diagnosis not present

## 2020-04-13 DIAGNOSIS — Z79899 Other long term (current) drug therapy: Secondary | ICD-10-CM | POA: Diagnosis not present

## 2020-04-13 DIAGNOSIS — Z955 Presence of coronary angioplasty implant and graft: Secondary | ICD-10-CM | POA: Diagnosis not present

## 2020-04-15 DIAGNOSIS — E119 Type 2 diabetes mellitus without complications: Secondary | ICD-10-CM | POA: Diagnosis not present

## 2020-04-15 DIAGNOSIS — K219 Gastro-esophageal reflux disease without esophagitis: Secondary | ICD-10-CM | POA: Diagnosis not present

## 2020-04-15 DIAGNOSIS — Z955 Presence of coronary angioplasty implant and graft: Secondary | ICD-10-CM | POA: Diagnosis not present

## 2020-04-15 DIAGNOSIS — Z79899 Other long term (current) drug therapy: Secondary | ICD-10-CM | POA: Diagnosis not present

## 2020-04-15 DIAGNOSIS — Z7982 Long term (current) use of aspirin: Secondary | ICD-10-CM | POA: Diagnosis not present

## 2020-04-15 DIAGNOSIS — I2510 Atherosclerotic heart disease of native coronary artery without angina pectoris: Secondary | ICD-10-CM | POA: Diagnosis not present

## 2020-04-16 DIAGNOSIS — Z6839 Body mass index (BMI) 39.0-39.9, adult: Secondary | ICD-10-CM | POA: Diagnosis not present

## 2020-04-16 DIAGNOSIS — M2550 Pain in unspecified joint: Secondary | ICD-10-CM | POA: Diagnosis not present

## 2020-04-16 DIAGNOSIS — E669 Obesity, unspecified: Secondary | ICD-10-CM | POA: Diagnosis not present

## 2020-04-16 DIAGNOSIS — M7532 Calcific tendinitis of left shoulder: Secondary | ICD-10-CM | POA: Diagnosis not present

## 2020-04-16 DIAGNOSIS — M159 Polyosteoarthritis, unspecified: Secondary | ICD-10-CM | POA: Diagnosis not present

## 2020-04-16 DIAGNOSIS — M1120 Other chondrocalcinosis, unspecified site: Secondary | ICD-10-CM | POA: Diagnosis not present

## 2020-04-16 DIAGNOSIS — M7531 Calcific tendinitis of right shoulder: Secondary | ICD-10-CM | POA: Diagnosis not present

## 2020-04-17 ENCOUNTER — Telehealth: Payer: Self-pay | Admitting: *Deleted

## 2020-04-17 DIAGNOSIS — Z7982 Long term (current) use of aspirin: Secondary | ICD-10-CM | POA: Diagnosis not present

## 2020-04-17 DIAGNOSIS — K219 Gastro-esophageal reflux disease without esophagitis: Secondary | ICD-10-CM | POA: Diagnosis not present

## 2020-04-17 DIAGNOSIS — I2510 Atherosclerotic heart disease of native coronary artery without angina pectoris: Secondary | ICD-10-CM | POA: Diagnosis not present

## 2020-04-17 DIAGNOSIS — Z79899 Other long term (current) drug therapy: Secondary | ICD-10-CM | POA: Diagnosis not present

## 2020-04-17 DIAGNOSIS — Z955 Presence of coronary angioplasty implant and graft: Secondary | ICD-10-CM | POA: Diagnosis not present

## 2020-04-17 DIAGNOSIS — E119 Type 2 diabetes mellitus without complications: Secondary | ICD-10-CM | POA: Diagnosis not present

## 2020-04-17 NOTE — Telephone Encounter (Signed)
Patient is scheduled for CPAP Titration on 04/24/20. Patient understands her titration study will be done at [facility] sleep lab. Patient understands she will receive a letter in a week or so detailing appointment, date, time, and location. Patient understands to call if she does not receive the letter  in a timely manner. Patient agrees with treatment and thanked me for call.

## 2020-04-20 DIAGNOSIS — K219 Gastro-esophageal reflux disease without esophagitis: Secondary | ICD-10-CM | POA: Diagnosis not present

## 2020-04-20 DIAGNOSIS — E119 Type 2 diabetes mellitus without complications: Secondary | ICD-10-CM | POA: Diagnosis not present

## 2020-04-20 DIAGNOSIS — I2510 Atherosclerotic heart disease of native coronary artery without angina pectoris: Secondary | ICD-10-CM | POA: Diagnosis not present

## 2020-04-20 DIAGNOSIS — Z7982 Long term (current) use of aspirin: Secondary | ICD-10-CM | POA: Diagnosis not present

## 2020-04-20 DIAGNOSIS — Z79899 Other long term (current) drug therapy: Secondary | ICD-10-CM | POA: Diagnosis not present

## 2020-04-20 DIAGNOSIS — Z955 Presence of coronary angioplasty implant and graft: Secondary | ICD-10-CM | POA: Diagnosis not present

## 2020-04-21 ENCOUNTER — Encounter (INDEPENDENT_AMBULATORY_CARE_PROVIDER_SITE_OTHER): Payer: Self-pay | Admitting: Physician Assistant

## 2020-04-21 ENCOUNTER — Other Ambulatory Visit: Payer: Self-pay

## 2020-04-21 ENCOUNTER — Ambulatory Visit (INDEPENDENT_AMBULATORY_CARE_PROVIDER_SITE_OTHER): Payer: Medicare PPO | Admitting: Physician Assistant

## 2020-04-21 VITALS — BP 116/76 | HR 87 | Temp 98.0°F | Ht 63.0 in | Wt 221.0 lb

## 2020-04-21 DIAGNOSIS — Z794 Long term (current) use of insulin: Secondary | ICD-10-CM | POA: Diagnosis not present

## 2020-04-21 DIAGNOSIS — E7849 Other hyperlipidemia: Secondary | ICD-10-CM | POA: Diagnosis not present

## 2020-04-21 DIAGNOSIS — E1169 Type 2 diabetes mellitus with other specified complication: Secondary | ICD-10-CM | POA: Diagnosis not present

## 2020-04-21 DIAGNOSIS — E785 Hyperlipidemia, unspecified: Secondary | ICD-10-CM | POA: Diagnosis not present

## 2020-04-21 DIAGNOSIS — E559 Vitamin D deficiency, unspecified: Secondary | ICD-10-CM

## 2020-04-22 LAB — COMPREHENSIVE METABOLIC PANEL
ALT: 25 IU/L (ref 0–32)
AST: 25 IU/L (ref 0–40)
Albumin/Globulin Ratio: 1.8 (ref 1.2–2.2)
Albumin: 4.8 g/dL — ABNORMAL HIGH (ref 3.7–4.7)
Alkaline Phosphatase: 104 IU/L (ref 44–121)
BUN/Creatinine Ratio: 28 (ref 12–28)
BUN: 32 mg/dL — ABNORMAL HIGH (ref 8–27)
Bilirubin Total: 0.5 mg/dL (ref 0.0–1.2)
CO2: 27 mmol/L (ref 20–29)
Calcium: 11.3 mg/dL — ABNORMAL HIGH (ref 8.7–10.3)
Chloride: 99 mmol/L (ref 96–106)
Creatinine, Ser: 1.13 mg/dL — ABNORMAL HIGH (ref 0.57–1.00)
GFR calc Af Amer: 57 mL/min/{1.73_m2} — ABNORMAL LOW (ref >59)
GFR calc non Af Amer: 49 mL/min/{1.73_m2} — ABNORMAL LOW (ref >59)
Globulin, Total: 2.7 g/dL (ref 1.5–4.5)
Glucose: 104 mg/dL — ABNORMAL HIGH (ref 65–99)
Potassium: 4.4 mmol/L (ref 3.5–5.2)
Sodium: 142 mmol/L (ref 134–144)
Total Protein: 7.5 g/dL (ref 6.0–8.5)

## 2020-04-22 LAB — LIPID PANEL
Chol/HDL Ratio: 3.7 ratio (ref 0.0–4.4)
Cholesterol, Total: 140 mg/dL (ref 100–199)
HDL: 38 mg/dL — ABNORMAL LOW (ref >39)
LDL Chol Calc (NIH): 71 mg/dL (ref 0–99)
Triglycerides: 182 mg/dL — ABNORMAL HIGH (ref 0–149)
VLDL Cholesterol Cal: 31 mg/dL (ref 5–40)

## 2020-04-22 LAB — MICROALBUMIN, URINE: Microalbumin, Urine: 6.4 ug/mL

## 2020-04-22 LAB — VITAMIN D 25 HYDROXY (VIT D DEFICIENCY, FRACTURES): Vit D, 25-Hydroxy: 63.9 ng/mL (ref 30.0–100.0)

## 2020-04-22 LAB — HEMOGLOBIN A1C
Est. average glucose Bld gHb Est-mCnc: 180 mg/dL
Hgb A1c MFr Bld: 7.9 % — ABNORMAL HIGH (ref 4.8–5.6)

## 2020-04-22 NOTE — Progress Notes (Signed)
Chief Complaint:   OBESITY Pamela Love is here to discuss her progress with her obesity treatment plan along with follow-up of her obesity related diagnoses. Pamela Love is on the Category 2 Plan and states she is following her eating plan approximately 75% of the time. Pamela Love states she is doing cardiac rehab 40 minutes 3 times per week.  Today's visit was #: 3 Starting weight: 227 lbs Starting date: 03/17/2020 Today's weight: 221 lbs Today's date: 04/21/2020 Total lbs lost to date: 6 Total lbs lost since last in-office visit: 6  Interim History: Pamela Love is eating fruit and yogurt for breakfast, 4 oz of meat on light bread with a banana for lunch, snack is Puffcorn, and 4 oz of meat, 1/2 potato or rice and veggies for dinner, and a Yasso bar for a second snack. She reports hunger is controlled.  Subjective:   Type 2 diabetes mellitus with other specified complication, with long-term current use of insulin (HCC). Pamela Love is on Victoza and Toujeo 114 units. She is decreasing her Toujeo dose by 10% each time she increases her Victoza. Fasting blood sugars are 105-125. Dr. Everardo All is managing.  Lab Results  Component Value Date   HGBA1C 8.0 (A) 02/20/2020   HGBA1C 7.7 (A) 10/07/2019   Other hyperlipidemia. Pamela Love is on Crestor and is managed by her PCP. She is exercising regularly.   Vitamin D deficiency. Pamela Love is on OTC Vitamin D 2,000 units daily.   Ref. Range 04/21/2020 12:09  Vitamin D, 25-Hydroxy Latest Ref Range: 30.0 - 100.0 ng/mL 63.9   Assessment/Plan:   Type 2 diabetes mellitus with other specified complication, with long-term current use of insulin (HCC).  Good blood sugar control is important to decrease the likelihood of diabetic complications such as nephropathy, neuropathy, limb loss, blindness, coronary artery disease, and death. Intensive lifestyle modification including diet, exercise and weight loss are the first line of treatment for diabetes. Pamela Love will  follow-up with Dr. Everardo All as scheduled. She will decrease insulin by 10% when she increases Victoza to 0.9 mg.  Other hyperlipidemia.  Cardiovascular risk and specific lipid/LDL goals reviewed.  We discussed several lifestyle modifications today and Pamela Love will continue to work on diet, exercise and weight loss efforts. Orders and follow up as documented in patient record.   Counseling Intensive lifestyle modifications are the first line treatment for this issue.  Dietary changes: Increase soluble fiber. Decrease simple carbohydrates.  Exercise changes: Moderate to vigorous-intensity aerobic activity 150 minutes per week if tolerated.  Lipid-lowering medications: see documented in medical record.  Vitamin D deficiency. Low Vitamin D level contributes to fatigue and are associated with obesity, breast, and colon cancer. She agrees to continue to take OTC Vitamin D as directed and VITAMIN D 25 Hydroxy (Vit-D Deficiency, Fractures) level will be checked today.   Class 2 severe obesity with serious comorbidity and body mass index (BMI) of 39.0 to 39.9 in adult, unspecified obesity type (HCC).  Pamela Love is currently in the action stage of change. As such, her goal is to continue with weight loss efforts. She has agreed to the Category 2 Plan.   She will work in 2 oz of protein during the day and increase her water intake.  Exercise goals: Older adults should follow the adult guidelines. When older adults cannot meet the adult guidelines, they should be as physically active as their abilities and conditions will allow.   Behavioral modification strategies: increasing water intake and meal planning and cooking strategies.  Pamela Love  has agreed to follow-up with our clinic in 2 weeks. She was informed of the importance of frequent follow-up visits to maximize her success with intensive lifestyle modifications for her multiple health conditions.   Pamela Love was informed we would discuss her lab results at her  next visit unless there is a critical issue that needs to be addressed sooner. Pamela Love agreed to keep her next visit at the agreed upon time to discuss these results.  Objective:   Blood pressure 116/76, pulse 87, temperature 98 F (36.7 C), height 5\' 3"  (1.6 m), weight 221 lb (100.2 kg), SpO2 98 %. Body mass index is 39.15 kg/m.  General: Cooperative, alert, well developed, in no acute distress. HEENT: Conjunctivae and lids unremarkable. Cardiovascular: Regular rhythm.  Lungs: Normal work of breathing. Neurologic: No focal deficits.   Lab Results  Component Value Date   HGBA1C 8.0 (A) 02/20/2020   HGBA1C 7.7 (A) 10/07/2019   HGBA1C 7.3 (A) 05/14/2019   HGBA1C 9.0 (A) 09/19/2018   No results found for: INSULIN Lab Results  Component Value Date   TSH 1.71 05/30/2018   Lab Results  Component Value Date   WBC 7.1 01/07/2020   HGB 13.2 01/07/2020   HCT 39.1 01/07/2020   MCV 90.3 01/07/2020   PLT 225 01/07/2020   No results found for: IRON, TIBC, FERRITIN  Obesity Behavioral Intervention:   Approximately 15 minutes were spent on the discussion below.  ASK: We discussed the diagnosis of obesity with Pamela Love today and Pamela Love agreed to give Pamela Love permission to discuss obesity behavioral modification therapy today.  ASSESS: Pamela Love has the diagnosis of obesity and her BMI today is 39.3. Pamela Love is in the action stage of change.   ADVISE: Pamela Love was educated on the multiple health risks of obesity as well as the benefit of weight loss to improve her health. She was advised of the need for long term treatment and the importance of lifestyle modifications to improve her current health and to decrease her risk of future health problems.  AGREE: Multiple dietary modification options and treatment options were discussed and Pamela Love agreed to follow the recommendations documented in the above note.  ARRANGE: Pamela Love was educated on the importance of frequent visits to treat obesity as outlined  per CMS and USPSTF guidelines and agreed to schedule her next follow up appointment today.  Attestation Statements:   Reviewed by clinician on day of visit: allergies, medications, problem list, medical history, surgical history, family history, social history, and previous encounter notes.  IStark Love, am acting as transcriptionist for Marianna Payment, PA-C   I have reviewed the above documentation for accuracy and completeness, and I agree with the above. Alois Cliche, PA-C

## 2020-04-24 ENCOUNTER — Other Ambulatory Visit: Payer: Self-pay

## 2020-04-24 ENCOUNTER — Ambulatory Visit (HOSPITAL_BASED_OUTPATIENT_CLINIC_OR_DEPARTMENT_OTHER): Payer: Medicare PPO | Attending: Cardiovascular Disease | Admitting: Cardiovascular Disease

## 2020-04-24 DIAGNOSIS — G4733 Obstructive sleep apnea (adult) (pediatric): Secondary | ICD-10-CM | POA: Diagnosis not present

## 2020-04-30 DIAGNOSIS — C44722 Squamous cell carcinoma of skin of right lower limb, including hip: Secondary | ICD-10-CM | POA: Diagnosis not present

## 2020-05-05 ENCOUNTER — Ambulatory Visit (INDEPENDENT_AMBULATORY_CARE_PROVIDER_SITE_OTHER): Payer: Medicare PPO | Admitting: Family Medicine

## 2020-05-05 ENCOUNTER — Other Ambulatory Visit: Payer: Self-pay

## 2020-05-05 ENCOUNTER — Encounter (INDEPENDENT_AMBULATORY_CARE_PROVIDER_SITE_OTHER): Payer: Self-pay | Admitting: Family Medicine

## 2020-05-05 VITALS — BP 104/66 | HR 84 | Temp 98.1°F | Ht 63.0 in | Wt 220.0 lb

## 2020-05-05 DIAGNOSIS — E1169 Type 2 diabetes mellitus with other specified complication: Secondary | ICD-10-CM | POA: Diagnosis not present

## 2020-05-05 DIAGNOSIS — Z794 Long term (current) use of insulin: Secondary | ICD-10-CM | POA: Diagnosis not present

## 2020-05-05 DIAGNOSIS — Z6839 Body mass index (BMI) 39.0-39.9, adult: Secondary | ICD-10-CM | POA: Diagnosis not present

## 2020-05-06 NOTE — Progress Notes (Signed)
Chief Complaint:   OBESITY Pamela Love is here to discuss her progress with her obesity treatment plan along with follow-up of her obesity related diagnoses. Pamela Love is on the Category 2 Plan and states she is following her eating plan approximately 80% of the time. Pamela Love states she is exercising for 0 minutes 0 times per week.  Today's visit was #: 4 Starting weight: 227 lbs Starting date: 03/17/2020 Today's weight: 220 lbs Today's date: 05/05/2020 Total lbs lost to date: 7 lbs Total lbs lost since last in-office visit: 1 lb Total weight loss percentage to date: -3.08%  Interim History: Pamela Love says she had a Mohs procedure done to her right shin to remove squamous cell carcinoma, with several sutures in place and pressure bandage.  She has not been doing any exercise.  She says she is doing well.  Polyphagia is controlled.  Assessment/Plan:   1. Type 2 diabetes mellitus with other specified complication, with long-term current use of insulin (HCC) Diabetes Mellitus: stable. Medication: Toujeo, Victoza, . Issues reviewed with her: blood sugar goals, complications of diabetes mellitus, hypoglycemia prevention and treatment, exercise, nutrition, and carbohydrate counting.   She is tolerating Victoza up to 1.8 mg daily.  Blood sugars average 100-140.  Insulin is down to 88 units (previously 140).  She denies lows.  Polyphagia is improved.  Lab Results  Component Value Date   HGBA1C 7.9 (H) 04/21/2020   HGBA1C 8.0 (A) 02/20/2020   HGBA1C 7.7 (A) 10/07/2019   Lab Results  Component Value Date   LDLCALC 71 04/21/2020   CREATININE 1.13 (H) 04/21/2020   2. High calcium levels Monitor for exogenous calcium.  Recheck at next lab draw.  Lab Results  Component Value Date   CALCIUM 11.3 (H) 04/21/2020   3. Class 2 severe obesity with serious comorbidity and body mass index (BMI) of 39.0 to 39.9 in adult, unspecified obesity type (HCC)  Pamela Love is currently in the action stage of change. As  such, her goal is to continue with weight loss efforts. She has agreed to the Category 2 Plan.   Exercise goals: Only upper body until cleared by Dermatology.  Behavioral modification strategies: increasing lean protein intake, decreasing simple carbohydrates and increasing vegetables.  Pamela Love has agreed to follow-up with our clinic in 3 weeks. She was informed of the importance of frequent follow-up visits to maximize her success with intensive lifestyle modifications for her multiple health conditions.   Objective:   Blood pressure 104/66, pulse 84, temperature 98.1 F (36.7 C), temperature source Oral, height 5\' 3"  (1.6 m), weight 220 lb (99.8 kg), SpO2 97 %. Body mass index is 38.97 kg/m.  General: Cooperative, alert, well developed, in no acute distress. HEENT: Conjunctivae and lids unremarkable. Cardiovascular: Regular rhythm.  Lungs: Normal work of breathing. Neurologic: No focal deficits.   Lab Results  Component Value Date   CREATININE 1.13 (H) 04/21/2020   BUN 32 (H) 04/21/2020   NA 142 04/21/2020   K 4.4 04/21/2020   CL 99 04/21/2020   CO2 27 04/21/2020   Lab Results  Component Value Date   ALT 25 04/21/2020   AST 25 04/21/2020   ALKPHOS 104 04/21/2020   BILITOT 0.5 04/21/2020   Lab Results  Component Value Date   HGBA1C 7.9 (H) 04/21/2020   HGBA1C 8.0 (A) 02/20/2020   HGBA1C 7.7 (A) 10/07/2019   HGBA1C 7.3 (A) 05/14/2019   HGBA1C 9.0 (A) 09/19/2018   Lab Results  Component Value Date   TSH  1.71 05/30/2018   Lab Results  Component Value Date   CHOL 140 04/21/2020   HDL 38 (L) 04/21/2020   LDLCALC 71 04/21/2020   TRIG 182 (H) 04/21/2020   CHOLHDL 3.7 04/21/2020   Lab Results  Component Value Date   WBC 7.1 01/07/2020   HGB 13.2 01/07/2020   HCT 39.1 01/07/2020   MCV 90.3 01/07/2020   PLT 225 01/07/2020   Obesity Behavioral Intervention:   Approximately 15 minutes were spent on the discussion below.  ASK: We discussed the diagnosis of  obesity with Pamela Love today and Pamela Love agreed to give [place] permission to discuss obesity behavioral modification therapy today.  ASSESS: Pamela Love has the diagnosis of obesity and her BMI today is 39.0. Pamela Love is in the action stage of change.   ADVISE: Pamela Love was educated on the multiple health risks of obesity as well as the benefit of weight loss to improve her health. She was advised of the need for long term treatment and the importance of lifestyle modifications to improve her current health and to decrease her risk of future health problems.  AGREE: Multiple dietary modification options and treatment options were discussed and Pamela Love agreed to follow the recommendations documented in the above note.  ARRANGE: Pamela Love was educated on the importance of frequent visits to treat obesity as outlined per CMS and USPSTF guidelines and agreed to schedule her next follow up appointment today.  Attestation Statements:   Reviewed by clinician on day of visit: allergies, medications, problem list, medical history, surgical history, family history, social history, and previous encounter notes.  I, Insurance claims handler, CMA, am acting as transcriptionist for Helane Rima, DO  I have reviewed the above documentation for accuracy and completeness, and I agree with the above. Helane Rima, DO

## 2020-05-11 ENCOUNTER — Telehealth: Payer: Self-pay | Admitting: Cardiology

## 2020-05-11 NOTE — Telephone Encounter (Signed)
Called patient. She reports that Saturday night she woke up with left chest pain under her breast. She was also having right sided jaw pain at the time along with he teeth hurting . She denies shortness of breath at the time. She woke her husband up and the pain started to cease. She took a nitro and pain never came back. The next day she was sore in her chest and her shoulder. Today no pain. I advised her if this returns to go to the emergency department. Otherwise she has a appointment with Dr. Servando Salina tomorrow and plans to come. Will consult with DOD to make sure no further suggestions.

## 2020-05-11 NOTE — Telephone Encounter (Signed)
I would schedule her to be seen by Dr. Servando Salina this week

## 2020-05-11 NOTE — Telephone Encounter (Signed)
Pt c/o of Chest Pain: STAT if CP now or developed within 24 hours  1. Are you having CP right now? no  2. Are you experiencing any other symptoms (ex. SOB, nausea, vomiting, sweating)? Jaw pain, her teeth just hurt.   3. How long have you been experiencing CP? ~ 10 minutes total. It was severe and fast  4. Is your CP continuous or coming and going? It is gone.   5. Have you taken Nitroglycerin? Yes, patient took one  ?  Patient said she woke up Saturday morning with an extremely sharp pain under her breast and her jaw hurt. She went to get up to go to the bathroom but was lightheaded. When she got back she woke up her husband but by the time her husband gave her the Nitro the chest pain had subsided.   She was sore under her breast for a little while but it was nothing as severe as what woke her up. She called the on call service at her PCP and they advised her to contact us to see if she needed to come in sooner. She is scheduled for an appointment tomorrow

## 2020-05-11 NOTE — Telephone Encounter (Signed)
Patient has appointment tomorrow with Dr. Servando Salina.

## 2020-05-12 ENCOUNTER — Encounter: Payer: Self-pay | Admitting: Cardiology

## 2020-05-12 ENCOUNTER — Ambulatory Visit: Payer: Medicare PPO | Admitting: Cardiology

## 2020-05-12 ENCOUNTER — Encounter (INDEPENDENT_AMBULATORY_CARE_PROVIDER_SITE_OTHER): Payer: Self-pay | Admitting: Family Medicine

## 2020-05-12 ENCOUNTER — Other Ambulatory Visit: Payer: Self-pay

## 2020-05-12 VITALS — BP 100/58 | HR 80 | Ht 63.0 in | Wt 218.8 lb

## 2020-05-12 DIAGNOSIS — E669 Obesity, unspecified: Secondary | ICD-10-CM | POA: Diagnosis not present

## 2020-05-12 DIAGNOSIS — I10 Essential (primary) hypertension: Secondary | ICD-10-CM | POA: Diagnosis not present

## 2020-05-12 DIAGNOSIS — E785 Hyperlipidemia, unspecified: Secondary | ICD-10-CM | POA: Diagnosis not present

## 2020-05-12 DIAGNOSIS — G4733 Obstructive sleep apnea (adult) (pediatric): Secondary | ICD-10-CM

## 2020-05-12 DIAGNOSIS — I2510 Atherosclerotic heart disease of native coronary artery without angina pectoris: Secondary | ICD-10-CM | POA: Diagnosis not present

## 2020-05-12 HISTORY — DX: Obstructive sleep apnea (adult) (pediatric): G47.33

## 2020-05-12 HISTORY — DX: Obesity, unspecified: E66.9

## 2020-05-12 MED ORDER — RANOLAZINE ER 500 MG PO TB12: 500.0000 mg | ORAL_TABLET | Freq: Two times a day (BID) | ORAL | 2 refills | Status: DC

## 2020-05-12 NOTE — Progress Notes (Signed)
Cardiology Office Note:    Date:  05/12/2020   ID:  Pamela Love, DOB 29-Jul-1951, MRN [id number]  PCP:  Mayra Neer, MD  Cardiologist:  Berniece Salines, DO  Electrophysiologist:  None   Referring MD: Mayra Neer, MD   " I had chest pain"  History of Present Illness:    Pamela Love is a 69 y.o. female with a hx of coronary artery disease status post stent on January 06, 2020 to RCA as a result of a positive stress test, hypertension, diabetes mellitus, hyperlipidemia and sleep apnea.  I last the patient after her LHC, at that visit we discuss continuing her DAPT and crestor. She is here for a follow visit. She reports that had an episode of chest pain last week, She describes a pain that woke her up from her sleep. It was a left sided sensation.   Past Medical History:  Diagnosis Date   Arthritis    Bursitis    hips   C. difficile colitis 12/2017   CKD (chronic kidney disease), stage III (HCC)    Complication of anesthesia    "spinal did not work with second c section"   Diabetes mellitus ([place]) 11/19/2015   Diabetes mellitus, type 2 ([place])    diet controlled - has Rx for Glymipride but has not started taking yet   Dyslipidemia 10/21/2014   GERD (gastroesophageal reflux disease)    History of nonmelanoma skin cancer    History of skin cancer    Hypercholesterolemia 12/11/2019   Hyperlipidemia    Hypertension    Hypertensive disorder 10/21/2014   Lower extremity edema    OAB (overactive bladder)    Osteoarthritis of right knee 05/15/2015   Osteoarthrosis, unspecified whether generalized or localized, involving lower leg 10/21/2014   Osteoporosis    Over weight    Primary osteoarthritis of left knee 12/18/2014   S/P knee replacement 12/18/2014   S/P total knee replacement using cement 05/15/2015   Sleep apnea    Tachycardia 12/11/2019    Past Surgical History:  Procedure Laterality Date   ABDOMINAL HYSTERECTOMY  2005   CATARACT  EXTRACTION     CESAREAN SECTION     x2   colonscopy      CORONARY STENT INTERVENTION N/A 01/06/2020   Procedure: CORONARY STENT INTERVENTION;  Surgeon: Lorretta Harp, MD;  Location: [facility];  Service: Cardiovascular;  Laterality: N/A;   EYE SURGERY     bilat cataract surgery 2015   INTRAVASCULAR PRESSURE WIRE/FFR STUDY N/A 01/06/2020   Procedure: INTRAVASCULAR PRESSURE WIRE/FFR STUDY;  Surgeon: Lorretta Harp, MD;  Location: [facility];  Service: Cardiovascular;  Laterality: N/A;   LEFT HEART CATH AND CORONARY ANGIOGRAPHY N/A 01/06/2020   Procedure: LEFT HEART CATH AND CORONARY ANGIOGRAPHY;  Surgeon: Lorretta Harp, MD;  Location: [facility];  Service: Cardiovascular;  Laterality: N/A;   TOTAL KNEE ARTHROPLASTY Left 12/18/2014   Procedure: TOTAL LEFT KNEE ARTHROPLASTY;  Surgeon: Sydnee Cabal, MD;  Location: [facility];  Service: Orthopedics;  Laterality: Left;   TOTAL KNEE ARTHROPLASTY Right 05/15/2015   Procedure: RIGHT TOTAL KNEE ARTHROPLASTY;  Surgeon: Sydnee Cabal, MD;  Location: [facility];  Service: Orthopedics;  Laterality: Right;   UTERINE FIBROID SURGERY      Current Medications: Current Meds  Medication Sig   Accu-Chek Softclix Lancets lancets 1 each by Other route in the morning and at bedtime. E11.9   acetaminophen (TYLENOL ARTHRITIS PAIN) 650 MG CR tablet Take  1,300 mg by mouth at bedtime.    aspirin EC 81 MG tablet Take 1 tablet (81 mg total) by mouth daily.   Blood Glucose Monitoring Suppl (ACCU-CHEK GUIDE ME) w/Device KIT 1 each by Does not apply route 2 (two) times daily. E11.9   carvedilol (COREG) 3.125 MG tablet Take 1 tablet (3.125 mg total) by mouth 2 (two) times daily with a meal.   Cholecalciferol (VITAMIN D) 2000 UNITS CAPS Take 2,000 Units by mouth every morning.    clopidogrel (PLAVIX) 75 MG tablet Take 1 tablet (75 mg total) by mouth daily with breakfast.   Coenzyme Q10 (COQ10) 200 MG CAPS Take 200 mg by mouth daily.      diclofenac sodium (VOLTAREN) 1 % GEL Apply 1 application topically at bedtime as needed (knee pain.).    DULoxetine (CYMBALTA) 20 MG capsule Take 40 mg by mouth at bedtime.    glucose blood (ACCU-CHEK GUIDE) test strip 1 each by Other route 2 (two) times daily. E11.9   insulin glargine, 2 Unit Dial, (TOUJEO MAX SOLOSTAR) 300 UNIT/ML Solostar Pen Inject 140 Units into the skin every morning. And pen needles 1/day (Patient taking differently: Inject 88 Units into the skin every morning. And pen needles 1/day)   Insulin Pen Needle (BD PEN NEEDLE NANO U/F) 32G X 4 MM MISC 1 each by Does not apply route daily.   liraglutide (VICTOZA) 18 MG/3ML SOPN Inject 0.3 mLs (1.8 mg total) into the skin daily.   Multiple Vitamin (MULTIVITAMIN WITH MINERALS) TABS tablet Take 1 tablet by mouth every morning.    Omega-3 Fatty Acids (RA FISH OIL) 1400 MG CPDR Take 1,400 mg by mouth 2 (two) times daily.   potassium chloride SA (KLOR-CON) 20 MEQ tablet Take 1 tablet (20 mEq total) by mouth 3 (three) times a week.   rosuvastatin (CRESTOR) 40 MG tablet Take 40 mg by mouth daily.    saccharomyces boulardii (FLORASTOR) 250 MG capsule Take 250 mg by mouth daily.   trospium (SANCTURA) 20 MG tablet Take 20 mg by mouth 2 (two) times daily.   valsartan-hydrochlorothiazide (DIOVAN-HCT) 160-12.5 MG tablet Take 1 tablet by mouth daily.   vitamin C (ASCORBIC ACID) 250 MG tablet Take 250 mg by mouth daily.     Allergies:   Colchicine, Diclofenac, Janumet [sitagliptin-metformin hcl], Septra [sulfamethoxazole-trimethoprim], Trulicity [dulaglutide], Zetia [ezetimibe], Imipramine, and Tape   Social History   Socioeconomic History   Marital status: Married    Spouse name: Not on file   Number of children: Not on file   Years of education: Not on file   Highest education level: Not on file  Occupational History   Occupation: retired  Tobacco Use   Smoking status: Never Smoker   Smokeless tobacco: Never  Used  Scientific laboratory technician Use: Never used  Substance and Sexual Activity   Alcohol use: Yes    Comment: rare   Drug use: No   Sexual activity: Not on file  Other Topics Concern   Not on file  Social History Narrative   Not on file   Social Determinants of Health   Financial Resource Strain:    Difficulty of Paying Living Expenses: Not on file  Food Insecurity:    Worried About Charity fundraiser in the Last Year: Not on file   [place] in the Last Year: Not on file  Transportation Needs:    Lack of Transportation (Medical): Not on file   Lack of Transportation (Non-Medical): Not  on file  Physical Activity:    Days of Exercise per Week: Not on file   Minutes of Exercise per Session: Not on file  Stress:    Feeling of Stress : Not on file  Social Connections:    Frequency of Communication with Friends and Family: Not on file   Frequency of Social Gatherings with Friends and Family: Not on file   Attends Religious Services: Not on file   Active Member of Clubs or Organizations: Not on file   Attends Archivist Meetings: Not on file   Marital Status: Not on file     Family History: The patient's family history includes Cancer in her mother; Congenital heart disease in her son; Diabetes in her maternal grandfather; Heart attack in her father, maternal grandfather, paternal uncle, and sister; Heart disease in her father and mother; Hyperlipidemia in her father and mother; Hypertension in her father and mother; Sleep apnea in her father; Sudden death in her father.  ROS:   Review of Systems  Constitution: Negative for decreased appetite, fever and weight gain.  HENT: Negative for congestion, ear discharge, hoarse voice and sore throat.   Eyes: Negative for discharge, redness, vision loss in right eye and visual halos.  Cardiovascular: Negative for chest pain, dyspnea on exertion, leg swelling, orthopnea and palpitations.  Respiratory:  Negative for cough, hemoptysis, shortness of breath and snoring.   Endocrine: Negative for heat intolerance and polyphagia.  Hematologic/Lymphatic: Negative for bleeding problem. Does not bruise/bleed easily.  Skin: Negative for flushing, nail changes, rash and suspicious lesions.  Musculoskeletal: Negative for arthritis, joint pain, muscle cramps, myalgias, neck pain and stiffness.  Gastrointestinal: Negative for abdominal pain, bowel incontinence, diarrhea and excessive appetite.  Genitourinary: Negative for decreased libido, genital sores and incomplete emptying.  Neurological: Negative for brief paralysis, focal weakness, headaches and loss of balance.  Psychiatric/Behavioral: Negative for altered mental status, depression and suicidal ideas.  Allergic/Immunologic: Negative for HIV exposure and persistent infections.    EKGs/Labs/Other Studies Reviewed:    The following studies were reviewed today:   EKG:  None today   Recent Labs: 01/02/2020: Magnesium 1.6 01/07/2020: Hemoglobin 13.2; Platelets 225 04/21/2020: ALT 25; BUN 32; Creatinine, Ser 1.13; Potassium 4.4; Sodium 142  Recent Lipid Panel    Component Value Date/Time   CHOL 140 04/21/2020 1209   TRIG 182 (H) 04/21/2020 1209   HDL 38 (L) 04/21/2020 1209   CHOLHDL 3.7 04/21/2020 1209   LDLCALC 71 04/21/2020 1209    Physical Exam:    VS:  BP (!) 100/58 (BP Location: Right Arm, Patient Position: Sitting, Cuff Size: Normal)    Pulse 80    Ht _0  (1.6 m)    Wt 218 lb 12.8 oz (99.2 kg)    SpO2 99%    BMI 38.76 kg/m     Wt Readings from Last 3 Encounters:  05/12/20 218 lb 12.8 oz (99.2 kg)  05/05/20 220 lb (99.8 kg)  04/24/20 232 lb (105.2 kg)     GEN: Well nourished, well developed in no acute distress HEENT: Normal NECK: No JVD; No carotid bruits LYMPHATICS: No lymphadenopathy CARDIAC: S1S2 noted,RRR, no murmurs, rubs, gallops RESPIRATORY:  Clear to auscultation without rales, wheezing or rhonchi  ABDOMEN: Soft,  non-tender, non-distended, +bowel sounds, no guarding. EXTREMITIES: No edema, No cyanosis, no clubbing MUSCULOSKELETAL:  No deformity  SKIN: Warm and dry NEUROLOGIC:  Alert and oriented x 3, non-focal PSYCHIATRIC:  Normal affect, good insight  ASSESSMENT:    1.  Coronary artery disease involving native coronary artery of native heart without angina pectoris   2. Primary hypertension   3. Dyslipidemia   4. OSA (obstructive sleep apnea)   5. Obesity (BMI 30-39.9)    PLAN:     1. CAD - chest pain is concerning for stable angina, will start the patient on Renaxa 565m twice daily. She will continue her DAPT, statin and beta blocker. She has nitro sl prn at home, I have educated the patient on how to uKoreamedication as needed.  She is not really using her cpap and may need the device change, I will reach out to our sleep team to see if can help. Diabetes - per pcp Encourage healthy diet and increase exercise for weigh loss. Blood pressure acceptable, a little on the low side- we will continue to monitor  The patient is in agreement with the above plan. The patient left the office in stable condition.  The patient will follow up in 1 month due to medication change   Medication Adjustments/Labs and Tests Ordered: Current medicines are reviewed at length with the patient today.  Concerns regarding medicines are outlined above.  Orders Placed This Encounter  Procedures   Basic metabolic panel   Magnesium   Meds ordered this encounter  Medications   ranolazine (RANEXA) 500 MG 12 hr tablet    Sig: Take 1 tablet (500 mg total) by mouth 2 (two) times daily.    Dispense:  60 tablet    Refill:  2    Patient Instructions  Medication Instructions:  Your physician has recommended you make the following change in your medication:   START: Ranexa 500 mg twice daily   *If you need a refill on your cardiac medications before your next appointment, please call your pharmacy*   Lab  Work: Your physician recommends that you return for lab work today: bmp, mg   If you have labs (blood work) drawn today and your tests are completely normal, you will receive your results only by:  [facility](if you have MyChart) OR  A paper copy in the mail If you have any lab test that is abnormal or we need to change your treatment, we will call you to review the results.   Testing/Procedures: None   Follow-Up: [facility] you and your health needs are our priority.  As part of our continuing mission to provide you with exceptional heart care, we have created designated Provider Care Teams.  These Care Teams include your primary Cardiologist (physician) and Advanced Practice Providers (APPs -  Physician Assistants and Nurse Practitioners) who all work together to provide you with the care you need, when you need it.  We recommend signing up for the patient portal called "MyChart".  Sign up information is provided on this After Visit Summary.  MyChart is used to connect with patients for Virtual Visits (Telemedicine).  Patients are able to view lab/test results, encounter notes, upcoming appointments, etc.  Non-urgent messages can be sent to your provider as well.   To learn more about what you can do with MyChart, go to [email or web address]    Your next appointment:   1 month(s)  The format for your next appointment:   In Person  Provider:   KBerniece Salines DO   Other Instructions  Ranolazine tablets, extended release What is this medicine? RANOLAZINE (ra NOE la zeen) is a heart medicine. It is used to treat chronic chest pain (angina). This medicine must be taken  regularly. It will not relieve an acute episode of chest pain. This medicine may be used for other purposes; ask your health care provider or pharmacist if you have questions. COMMON BRAND NAME(S): Ranexa What should I tell my health care provider before I take this medicine? They need to know if you  have any of these conditions:  heart disease  irregular heartbeat  kidney disease  liver disease  low levels of potassium or magnesium in the blood  an unusual or allergic reaction to ranolazine, other medicines, foods, dyes, or preservatives  pregnant or trying to get pregnant  breast-feeding How should I use this medicine? Take this medicine by mouth with a glass of water. Follow the directions on the prescription label. Do not cut, crush, or chew this medicine. Take with or without food. Do not take this medication with grapefruit juice. Take your doses at regular intervals. Do not take your medicine more often then directed. Talk to your pediatrician regarding the use of this medicine in children. Special care may be needed. Overdosage: If you think you have taken too much of this medicine contact a poison control center or emergency room at once. NOTE: This medicine is only for you. Do not share this medicine with others. What if I miss a dose? If you miss a dose, take it as soon as you can. If it is almost time for your next dose, take only that dose. Do not take double or extra doses. What may interact with this medicine? Do not take this medicine with any of the following medications:  antivirals for HIV or AIDS  cerivastatin  certain antibiotics like chloramphenicol, clarithromycin, dalfopristin; quinupristin, isoniazid, rifabutin, rifampin, rifapentine  certain medicines used for cancer like imatinib, nilotinib  certain medicines for fungal infections like fluconazole, itraconazole, ketoconazole, posaconazole, voriconazole  certain medicines for irregular heart beat like dronedarone  certain medicines for seizures like carbamazepine, fosphenytoin, oxcarbazepine, phenobarbital, phenytoin  cisapride  conivaptan  cyclosporine  grapefruit or grapefruit juice  lumacaftor; ivacaftor  nefazodone  pimozide  quinacrine  St John's wort  thioridazine This  medicine may also interact with the following medications:  alfuzosin  certain medicines for depression, anxiety, or psychotic disturbances like bupropion, citalopram, fluoxetine, fluphenazine, paroxetine, perphenazine, risperidone, sertraline, trifluoperazine  certain medicines for cholesterol like atorvastatin, lovastatin, simvastatin  certain medicines for stomach problems like octreotide, palonosetron, prochlorperazine  eplerenone  ergot alkaloids like dihydroergotamine, ergonovine, ergotamine, methylergonovine  metformin  nicardipine  other medicines that prolong the QT interval (cause an abnormal heart rhythm) like dofetilide, ziprasidone  sirolimus  tacrolimus This list may not describe all possible interactions. Give your health care provider a list of all the medicines, herbs, non-prescription drugs, or dietary supplements you use. Also tell them if you smoke, drink alcohol, or use illegal drugs. Some items may interact with your medicine. What should I watch for while using this medicine? Visit your doctor for regular check ups. Tell your doctor or healthcare professional if your symptoms do not start to get better or if they get worse. This medicine will not relieve an acute attack of angina or chest pain. This medicine can change your heart rhythm. Your health care provider may check your heart rhythm by ordering an electrocardiogram (ECG) while you are taking this medicine. You may get drowsy or dizzy. Do not drive, use machinery, or do anything that needs mental alertness until you know how this medicine affects you. Do not stand or sit up quickly, especially if you  are an older patient. This reduces the risk of dizzy or fainting spells. Alcohol may interfere with the effect of this medicine. Avoid alcoholic drinks. If you are scheduled for any medical or dental procedure, tell your healthcare provider that you are taking this medicine. This medicine can interact with other  medicines used during surgery. What side effects may I notice from receiving this medicine? Side effects that you should report to your doctor or health care professional as soon as possible:  allergic reactions like skin rash, itching or hives, swelling of the face, lips, or tongue  breathing problems  changes in vision  fast, irregular or pounding heartbeat  feeling faint or lightheaded, falls  low or high blood pressure  numbness or tingling feelings  ringing in the ears  tremor or shakiness  slow heartbeat (fewer than 50 beats per minute)  swelling of the legs or feet Side effects that usually do not require medical attention (report to your doctor or health care professional if they continue or are bothersome):  constipation  drowsy  dry mouth  headache  nausea or vomiting  stomach upset This list may not describe all possible side effects. Call your doctor for medical advice about side effects. You may report side effects to FDA at 1-800-FDA-1088. Where should I keep my medicine? Keep out of the reach of children. Store at room temperature between 15 and 30 degrees C (59 and 86 degrees F). Throw away any unused medicine after the expiration date. NOTE: This sheet is a summary. It may not cover all possible information. If you have questions about this medicine, talk to your doctor, pharmacist, or health care provider.  2020 Elsevier/Gold Standard (2018-07-10 09:18:49)      Adopting a Healthy Lifestyle.  Know what a healthy weight is for you (roughly BMI <25) and aim to maintain this   Aim for 7+ servings of fruits and vegetables daily   65-80+ fluid ounces of water or unsweet tea for healthy kidneys   Limit to max 1 drink of alcohol per day; avoid smoking/tobacco   Limit animal fats in diet for cholesterol and heart health - choose grass fed whenever available   Avoid highly processed foods, and foods high in saturated/trans fats   Aim for low  stress - take time to unwind and care for your mental health   Aim for 150 min of moderate intensity exercise weekly for heart health, and weights twice weekly for bone health   Aim for 7-9 hours of sleep daily   When it comes to diets, agreement about the perfect plan isnt easy to find, even among the experts. Experts at the [employer] developed an idea known as the Healthy Eating Plate. Just imagine a plate divided into logical, healthy portions.   The emphasis is on diet quality:   Load up on vegetables and fruits - one-half of your plate: Aim for color and variety, and remember that potatoes dont count.   Go for whole grains - one-quarter of your plate: Whole wheat, barley, wheat berries, quinoa, oats, brown rice, and foods made with them. If you want pasta, go with whole wheat pasta.   Protein power - one-quarter of your plate: Fish, chicken, beans, and nuts are all healthy, versatile protein sources. Limit red meat.   The diet, however, does go beyond the plate, offering a few other suggestions.   Use healthy plant oils, such as olive, canola, soy, corn, sunflower and peanut. Check the  labels, and avoid partially hydrogenated oil, which have unhealthy trans fats.   If youre thirsty, drink water. Coffee and tea are good in moderation, but skip sugary drinks and limit milk and dairy products to one or two daily servings.   The type of carbohydrate in the diet is more important than the amount. Some sources of carbohydrates, such as vegetables, fruits, whole grains, and beans-are healthier than others.   Finally, stay active  Signed, Berniece Salines, DO  05/12/2020 8:01 PM    [place]

## 2020-05-12 NOTE — Patient Instructions (Signed)
Medication Instructions:  Your physician has recommended you make the following change in your medication:   START: Ranexa 500 mg twice daily   *If you need a refill on your cardiac medications before your next appointment, please call your pharmacy*   Lab Work: Your physician recommends that you return for lab work today: bmp, mg   If you have labs (blood work) drawn today and your tests are completely normal, you will receive your results only by: Marland Kitchen MyChart Message (if you have MyChart) OR . A paper copy in the mail If you have any lab test that is abnormal or we need to change your treatment, we will call you to review the results.   Testing/Procedures: None   Follow-Up: At [facility], you and your health needs are our priority.  As part of our continuing mission to provide you with exceptional heart care, we have created designated Provider Care Teams.  These Care Teams include your primary Cardiologist (physician) and Advanced Practice Providers (APPs -  Physician Assistants and Nurse Practitioners) who all work together to provide you with the care you need, when you need it.  We recommend signing up for the patient portal called "MyChart".  Sign up information is provided on this After Visit Summary.  MyChart is used to connect with patients for Virtual Visits (Telemedicine).  Patients are able to view lab/test results, encounter notes, upcoming appointments, etc.  Non-urgent messages can be sent to your provider as well.   To learn more about what you can do with MyChart, go to [email or web address].    Your next appointment:   1 month(s)  The format for your next appointment:   In Person  Provider:   Thomasene Ripple, DO   Other Instructions  Ranolazine tablets, extended release What is this medicine? RANOLAZINE (ra NOE la zeen) is a heart medicine. It is used to treat chronic chest pain (angina). This medicine must be taken regularly. It will not relieve an acute  episode of chest pain. This medicine may be used for other purposes; ask your health care provider or pharmacist if you have questions. COMMON BRAND NAME(S): Ranexa What should I tell my health care provider before I take this medicine? They need to know if you have any of these conditions:  heart disease  irregular heartbeat  kidney disease  liver disease  low levels of potassium or magnesium in the blood  an unusual or allergic reaction to ranolazine, other medicines, foods, dyes, or preservatives  pregnant or trying to get pregnant  breast-feeding How should I use this medicine? Take this medicine by mouth with a glass of water. Follow the directions on the prescription label. Do not cut, crush, or chew this medicine. Take with or without food. Do not take this medication with grapefruit juice. Take your doses at regular intervals. Do not take your medicine more often then directed. Talk to your pediatrician regarding the use of this medicine in children. Special care may be needed. Overdosage: If you think you have taken too much of this medicine contact a poison control center or emergency room at once. NOTE: This medicine is only for you. Do not share this medicine with others. What if I miss a dose? If you miss a dose, take it as soon as you can. If it is almost time for your next dose, take only that dose. Do not take double or extra doses. What may interact with this medicine? Do not take this medicine with any  of the following medications:  antivirals for HIV or AIDS  cerivastatin  certain antibiotics like chloramphenicol, clarithromycin, dalfopristin; quinupristin, isoniazid, rifabutin, rifampin, rifapentine  certain medicines used for cancer like imatinib, nilotinib  certain medicines for fungal infections like fluconazole, itraconazole, ketoconazole, posaconazole, voriconazole  certain medicines for irregular heart beat like dronedarone  certain medicines for  seizures like carbamazepine, fosphenytoin, oxcarbazepine, phenobarbital, phenytoin  cisapride  conivaptan  cyclosporine  grapefruit or grapefruit juice  lumacaftor; ivacaftor  nefazodone  pimozide  quinacrine  St John's wort  thioridazine This medicine may also interact with the following medications:  alfuzosin  certain medicines for depression, anxiety, or psychotic disturbances like bupropion, citalopram, fluoxetine, fluphenazine, paroxetine, perphenazine, risperidone, sertraline, trifluoperazine  certain medicines for cholesterol like atorvastatin, lovastatin, simvastatin  certain medicines for stomach problems like octreotide, palonosetron, prochlorperazine  eplerenone  ergot alkaloids like dihydroergotamine, ergonovine, ergotamine, methylergonovine  metformin  nicardipine  other medicines that prolong the QT interval (cause an abnormal heart rhythm) like dofetilide, ziprasidone  sirolimus  tacrolimus This list may not describe all possible interactions. Give your health care provider a list of all the medicines, herbs, non-prescription drugs, or dietary supplements you use. Also tell them if you smoke, drink alcohol, or use illegal drugs. Some items may interact with your medicine. What should I watch for while using this medicine? Visit your doctor for regular check ups. Tell your doctor or healthcare professional if your symptoms do not start to get better or if they get worse. This medicine will not relieve an acute attack of angina or chest pain. This medicine can change your heart rhythm. Your health care provider may check your heart rhythm by ordering an electrocardiogram (ECG) while you are taking this medicine. You may get drowsy or dizzy. Do not drive, use machinery, or do anything that needs mental alertness until you know how this medicine affects you. Do not stand or sit up quickly, especially if you are an older patient. This reduces the risk of  dizzy or fainting spells. Alcohol may interfere with the effect of this medicine. Avoid alcoholic drinks. If you are scheduled for any medical or dental procedure, tell your healthcare provider that you are taking this medicine. This medicine can interact with other medicines used during surgery. What side effects may I notice from receiving this medicine? Side effects that you should report to your doctor or health care professional as soon as possible:  allergic reactions like skin rash, itching or hives, swelling of the face, lips, or tongue  breathing problems  changes in vision  fast, irregular or pounding heartbeat  feeling faint or lightheaded, falls  low or high blood pressure  numbness or tingling feelings  ringing in the ears  tremor or shakiness  slow heartbeat (fewer than 50 beats per minute)  swelling of the legs or feet Side effects that usually do not require medical attention (report to your doctor or health care professional if they continue or are bothersome):  constipation  drowsy  dry mouth  headache  nausea or vomiting  stomach upset This list may not describe all possible side effects. Call your doctor for medical advice about side effects. You may report side effects to FDA at 1-800-FDA-1088. Where should I keep my medicine? Keep out of the reach of children. Store at room temperature between 15 and 30 degrees C (59 and 86 degrees F). Throw away any unused medicine after the expiration date. NOTE: This sheet is a summary. It may not cover  all possible information. If you have questions about this medicine, talk to your doctor, pharmacist, or health care provider.  2020 Elsevier/Gold Standard (2018-07-10 09:18:49)

## 2020-05-13 ENCOUNTER — Telehealth: Payer: Self-pay

## 2020-05-13 LAB — BASIC METABOLIC PANEL
BUN/Creatinine Ratio: 37 — ABNORMAL HIGH (ref 12–28)
BUN: 43 mg/dL — ABNORMAL HIGH (ref 8–27)
CO2: 24 mmol/L (ref 20–29)
Calcium: 10.5 mg/dL — ABNORMAL HIGH (ref 8.7–10.3)
Chloride: 100 mmol/L (ref 96–106)
Creatinine, Ser: 1.17 mg/dL — ABNORMAL HIGH (ref 0.57–1.00)
GFR calc Af Amer: 54 mL/min/{1.73_m2} — ABNORMAL LOW (ref >59)
GFR calc non Af Amer: 47 mL/min/{1.73_m2} — ABNORMAL LOW (ref >59)
Glucose: 112 mg/dL — ABNORMAL HIGH (ref 65–99)
Potassium: 3.6 mmol/L (ref 3.5–5.2)
Sodium: 140 mmol/L (ref 134–144)

## 2020-05-13 LAB — MAGNESIUM: Magnesium: 2 mg/dL (ref 1.6–2.3)

## 2020-05-13 NOTE — Telephone Encounter (Signed)
LMTCB

## 2020-05-13 NOTE — Telephone Encounter (Signed)
-----   Message from Thomasene Ripple, DO sent at 05/13/2020 12:24 PM EDT ----- Labs stable from 3 weeks ago.  Same plan from yesterday.

## 2020-05-13 NOTE — Telephone Encounter (Signed)
Spoke with patient regarding results and recommendation.  Patient verbalizes understanding and is agreeable to plan of care. Advised patient to call back with any issues or concerns.  

## 2020-05-14 ENCOUNTER — Other Ambulatory Visit (INDEPENDENT_AMBULATORY_CARE_PROVIDER_SITE_OTHER): Payer: Self-pay | Admitting: Family Medicine

## 2020-05-14 DIAGNOSIS — Z794 Long term (current) use of insulin: Secondary | ICD-10-CM

## 2020-05-14 DIAGNOSIS — E1169 Type 2 diabetes mellitus with other specified complication: Secondary | ICD-10-CM

## 2020-05-19 ENCOUNTER — Ambulatory Visit (INDEPENDENT_AMBULATORY_CARE_PROVIDER_SITE_OTHER): Payer: Medicare PPO | Admitting: Physician Assistant

## 2020-05-19 ENCOUNTER — Encounter (INDEPENDENT_AMBULATORY_CARE_PROVIDER_SITE_OTHER): Payer: Self-pay | Admitting: Physician Assistant

## 2020-05-19 ENCOUNTER — Other Ambulatory Visit: Payer: Self-pay

## 2020-05-19 VITALS — BP 112/70 | HR 77 | Temp 97.7°F | Ht 63.0 in | Wt 215.0 lb

## 2020-05-19 DIAGNOSIS — E785 Hyperlipidemia, unspecified: Secondary | ICD-10-CM | POA: Diagnosis not present

## 2020-05-19 DIAGNOSIS — E1169 Type 2 diabetes mellitus with other specified complication: Secondary | ICD-10-CM | POA: Diagnosis not present

## 2020-05-19 DIAGNOSIS — Z6838 Body mass index (BMI) 38.0-38.9, adult: Secondary | ICD-10-CM

## 2020-05-20 MED ORDER — VICTOZA 18 MG/3ML ~~LOC~~ SOPN: 1.8000 mg | PEN_INJECTOR | Freq: Every day | SUBCUTANEOUS | 0 refills | Status: DC

## 2020-05-20 NOTE — Progress Notes (Signed)
Chief Complaint:   OBESITY Pamela Love is here to discuss her progress with her obesity treatment plan along with follow-up of her obesity related diagnoses. Pamela Love is on the Category 2 Plan and states she is following her eating plan approximately 20% of the time. Pamela Love states she is exercising 0 minutes 0 times per week.  Today's visit was #: 5 Starting weight: 227 lbs Starting date: 03/17/2020 Today's weight: 215 lbs Today's date: 05/19/2020 Total lbs lost to date: 12 Total lbs lost since last in-office visit: 5  Interim History: Pamela Love has had an "upset stomach" for the last 2 weeks and has not been able to eat all of the food on the plan. She saw her cardiologist last week and was placed on Ranexa. She will follow-up with them in 4 weeks.  Subjective:   Type 2 diabetes mellitus with hyperlipidemia (HCC). Fasting blood glucose levels are in the range of 107 and 153; before supper 65-93. Pamela Love is on Victoza 1.5 mg and insulin. She is followed by Dr. Everardo All.  Lab Results  Component Value Date   HGBA1C 7.9 (H) 04/21/2020   HGBA1C 8.0 (A) 02/20/2020   HGBA1C 7.7 (A) 10/07/2019   Lab Results  Component Value Date   LDLCALC 71 04/21/2020   CREATININE 1.17 (H) 05/12/2020   No results found for: INSULIN  Hypercalcemia. Recent calcium level decreased to 10.5 at the Cardiology office. Mag level was 2.0.  Assessment/Plan:   Type 2 diabetes mellitus with hyperlipidemia (HCC). Good blood sugar control is important to decrease the likelihood of diabetic complications such as nephropathy, neuropathy, limb loss, blindness, coronary artery disease, and death. Intensive lifestyle modification including diet, exercise and weight loss are the first line of treatment for diabetes. Refill was given for liraglutide (VICTOZA) 18 MG/3ML SOPN #3 with 0 refills. She will remain at 1.5 mg until her next visit.  Hypercalcemia. Will continue to monitor.  Class 2 severe obesity with  serious comorbidity and body mass index (BMI) of 38.0 to 38.9 in adult, unspecified obesity type (HCC).  Pamela Love is currently in the action stage of change. As such, her goal is to continue with weight loss efforts. She has agreed to the Category 2 Plan.   Exercise goals: Older adults should follow the adult guidelines. When older adults cannot meet the adult guidelines, they should be as physically active as their abilities and conditions will allow.   Behavioral modification strategies: increasing lean protein intake and no skipping meals.  Pamela Love has agreed to follow-up with our clinic in 2 weeks. She was informed of the importance of frequent follow-up visits to maximize her success with intensive lifestyle modifications for her multiple health conditions.   Objective:   Blood pressure 112/70, pulse 77, temperature 97.7 F (36.5 C), height 5\' 3"  (1.6 m), weight 215 lb (97.5 kg), SpO2 96 %. Body mass index is 38.09 kg/m.  General: Cooperative, alert, well developed, in no acute distress. HEENT: Conjunctivae and lids unremarkable. Cardiovascular: Regular rhythm.  Lungs: Normal work of breathing. Neurologic: No focal deficits.   Lab Results  Component Value Date   CREATININE 1.17 (H) 05/12/2020   BUN 43 (H) 05/12/2020   NA 140 05/12/2020   K 3.6 05/12/2020   CL 100 05/12/2020   CO2 24 05/12/2020   Lab Results  Component Value Date   ALT 25 04/21/2020   AST 25 04/21/2020   ALKPHOS 104 04/21/2020   BILITOT 0.5 04/21/2020   Lab Results  Component  Value Date   HGBA1C 7.9 (H) 04/21/2020   HGBA1C 8.0 (A) 02/20/2020   HGBA1C 7.7 (A) 10/07/2019   HGBA1C 7.3 (A) 05/14/2019   HGBA1C 9.0 (A) 09/19/2018   No results found for: INSULIN Lab Results  Component Value Date   TSH 1.71 05/30/2018   Lab Results  Component Value Date   CHOL 140 04/21/2020   HDL 38 (L) 04/21/2020   LDLCALC 71 04/21/2020   TRIG 182 (H) 04/21/2020   CHOLHDL 3.7 04/21/2020   Lab Results  Component  Value Date   WBC 7.1 01/07/2020   HGB 13.2 01/07/2020   HCT 39.1 01/07/2020   MCV 90.3 01/07/2020   PLT 225 01/07/2020   No results found for: IRON, TIBC, FERRITIN  Obesity Behavioral Intervention:   Approximately 15 minutes were spent on the discussion below.  ASK: We discussed the diagnosis of obesity with Pamela Love today and Pamela Love agreed to give [place] permission to discuss obesity behavioral modification therapy today.  ASSESS: Pamela Love has the diagnosis of obesity and her BMI today is 38.2. Pamela Love is in the action stage of change.   ADVISE: Pamela Love was educated on the multiple health risks of obesity as well as the benefit of weight loss to improve her health. She was advised of the need for long term treatment and the importance of lifestyle modifications to improve her current health and to decrease her risk of future health problems.  AGREE: Multiple dietary modification options and treatment options were discussed and Pamela Love agreed to follow the recommendations documented in the above note.  ARRANGE: Pamela Love was educated on the importance of frequent visits to treat obesity as outlined per CMS and USPSTF guidelines and agreed to schedule her next follow up appointment today.  Attestation Statements:   Reviewed by clinician on day of visit: allergies, medications, problem list, medical history, surgical history, family history, social history, and previous encounter notes.  Pamela Love, am acting as transcriptionist for Alois Cliche, PA-C   I have reviewed the above documentation for accuracy and completeness, and I agree with the above. Alois Cliche, PA-C

## 2020-05-21 ENCOUNTER — Telehealth: Payer: Self-pay | Admitting: Cardiology

## 2020-05-21 NOTE — Telephone Encounter (Signed)
Usually the Ranexa does not cause diarrhea.  But if she wanted to hold it a couple days to see if the diarrhea resolved I will be fine with that.  I am not sure when she started Protonix because we did started in our office but she can check with her PCP about that.

## 2020-05-21 NOTE — Telephone Encounter (Signed)
Spoke to the patient just now and let her know Dr. Mallory Shirk recommendations. She verbalizes understanding and thanks me for the call back.    Encouraged patient to call back with any questions or concerns.

## 2020-05-21 NOTE — Telephone Encounter (Signed)
Pt c/o medication issue:  1. Name of Medication: pantoprazole (PROTONIX) 40 MG  ranolazine (RANEXA) 500 MG 12 hr tablet  2. How are you currently taking this medication (dosage and times per day)? Protonix 1 tablet by mouth daily, Ranexa 1 tablet by mouth two times daily.   3. Are you having a reaction (difficulty breathing--STAT)? Yes   4. What is your medication issue? Pamela Love is calling stating she has been having diarrhea during the day and acid reflux during the night that is waking her up out of her sleep. She states she was having some stomach problems prior to starting Ranexa, but is not sure which medication could be causing it.

## 2020-05-23 ENCOUNTER — Encounter (HOSPITAL_BASED_OUTPATIENT_CLINIC_OR_DEPARTMENT_OTHER): Payer: Self-pay | Admitting: Cardiovascular Disease

## 2020-05-23 NOTE — Procedures (Signed)
Patient Name: Pamela Love, Pidcock Date: 04/24/2020 Gender: Female D.O.B: [birth year] Age (years): 9 Referring Provider: Godfrey Pick Tobb DO Height (inches): 63 Interpreting Physician: Shelva Majestic MD, ABSM Weight (lbs): 232 RPSGT: Earney Hamburg BMI: 41 MRN: [id number] Neck Size: 16.00  CLINICAL INFORMATION The patient is referred for a CPAP titration to treat sleep apnea.  Date of NPSG: 01/31/2020: AHI 18.2/h; RDI 23.1/h; absent REM sleep; O2 nadir 84%.  SLEEP STUDY TECHNIQUE As per the AASM Manual for the Scoring of Sleep and Associated Events v2.3 (April 2016) with a hypopnea requiring 4% desaturations.  The channels recorded and monitored were frontal, central and occipital EEG, electrooculogram (EOG), submentalis EMG (chin), nasal and oral airflow, thoracic and abdominal wall motion, anterior tibialis EMG, snore microphone, electrocardiogram, and pulse oximetry. Continuous positive airway pressure (CPAP) was initiated at the beginning of the study and titrated to treat sleep-disordered breathing.  MEDICATIONS acetaminophen (TYLENOL ARTHRITIS PAIN) 650 MG CR tablet aspirin EC 81 MG tablet Blood Glucose Monitoring Suppl (ACCU-CHEK GUIDE ME) w/Device KIT carvedilol (COREG) 3.125 MG tablet Cholecalciferol (VITAMIN D) 2000 UNITS CAPS clopidogrel (PLAVIX) 75 MG tablet Coenzyme Q10 (COQ10) 200 MG CAPS diclofenac sodium (VOLTAREN) 1 % GEL DULoxetine (CYMBALTA) 20 MG capsule furosemide (LASIX) 40 MG tablet(Expired) glucose blood (ACCU-CHEK GUIDE) test strip insulin glargine, 2 Unit Dial, (TOUJEO MAX SOLOSTAR) 300 UNIT/ML Solostar Pen Insulin Pen Needle (BD PEN NEEDLE NANO U/F) 32G X 4 MM MISC liraglutide (VICTOZA) 18 MG/3ML SOPN Multiple Vitamin (MULTIVITAMIN WITH MINERALS) TABS tablet nitroGLYCERIN (NITROSTAT) 0.4 MG SL tablet(Expired) Omega-3 Fatty Acids (RA FISH OIL) 1400 MG CPDR potassium chloride SA (KLOR-CON) 20 MEQ tablet ranolazine (RANEXA) 500 MG 12 hr  tablet rosuvastatin (CRESTOR) 40 MG tablet saccharomyces boulardii (FLORASTOR) 250 MG capsule trospium (SANCTURA) 20 MG tablet valsartan-hydrochlorothiazide (DIOVAN-HCT) 160-12.5 MG tablet vitamin C (ASCORBIC ACID) 250 MG tablet Medications self-administered by patient taken the night of the study : CARVEDILOL, FISH OIL, trospium, TYLENOL ALLERGY SINUS, DULOXETINE  TECHNICIAN COMMENTS Comments added by technician: Patient had difficulty initiating sleep. Patient was restless all through the night. Comments added by scorer: N/A  RESPIRATORY PARAMETERS Optimal PAP Pressure (cm):  AHI at Optimal Pressure (/hr): N/A Overall Minimal O2 (%): 80.0 Supine % at Optimal Pressure (%): N/A Minimal O2 at Optimal Pressure (%): 80.0   SLEEP ARCHITECTURE The study was initiated at 9:45:26 PM and ended at 4:47:58 AM.  Sleep onset time was 107.3 minutes and the sleep efficiency was 40.1%%. The total sleep time was 169.5 minutes.  The patient spent 5.6%% of the night in stage N1 sleep, 94.4%% in stage N2 sleep, 0.0%% in stage N3 and 0% in REM.Stage REM latency was N/A minutes  Wake after sleep onset was 145.7. Alpha intrusion was absent. Supine sleep was 67.55%.  CARDIAC DATA The 2 lead EKG demonstrated sinus rhythm. The mean heart rate was 74.3 beats per minute. Other EKG findings include: None.  LEG MOVEMENT DATA The total Periodic Limb Movements of Sleep (PLMS) were 0. The PLMS index was 0.0. A PLMS index of <15 is considered normal in adults.  IMPRESSIONS - CPAP was initiated at 5 cm and was titrated to 14 cm of water; AHI 5.6/h with O2 nadir 85%. REM sleep did not occur throughout the study.  An optimal PAP pressure could not be selected for this patient based on the available study data. - Central sleep apnea was not noted during this titration (CAI = 0.0/h). - Severe oxygen desaturation to a nadir of 80.0% at 11  cm of water. - Reduced sleep efficiency at only 40.1%. - The patient snored  with soft snoring volume during this titration study. - No cardiac abnormalities were observed during this study. - Clinically significant periodic limb movements were not noted during this study. Arousals associated with PLMs were rare.  DIAGNOSIS - Obstructive Sleep Apnea (G47.33)  RECOMMENDATIONS - Recommend an initial trial of CPAP Auto with EPR of 3 at 11- 20 cm H2O and heated humidification.  - Effort should be made to optimize nasal and oropharyngeal patency.  - Avoid alcohol, sedatives and other CNS depressants that may worsen sleep apnea and disrupt normal sleep architecture. - Sleep hygiene should be reviewed to assess factors that may improve sleep quality. - Weight management and regular exercise should be initiated or continued. - Recommend a download in 30 days and sleep clinic evaluation after 4 weeks of therapy.,   [Electronically signed] 05/23/2020 11:07 AM  Shelva Majestic MD, [facility], ABSM Diplomate, American Board of Sleep Medicine   NPI: [id number] [place] PH: [phone]   FX: [phone] [place]

## 2020-05-25 ENCOUNTER — Telehealth: Payer: Self-pay | Admitting: *Deleted

## 2020-05-25 NOTE — Telephone Encounter (Signed)
-----   Message from [name], MD sent at 05/23/2020 11:12 AM EDT ----- Pamela Love, please notify pt and set up with DME for CPAP

## 2020-05-25 NOTE — Telephone Encounter (Signed)
PER DPR Informed patient of sleep study results and patient understanding was verbalized. Patient understands her sleep study showed : IMPRESSIONS - CPAP was initiated at 5 cm and was titrated to 14 cm of water; AHI 5.6/h with O2 nadir 85%. REM sleep did not occur throughout the study.  An optimal PAP pressure could not be selected for this patient based on the available study data. - Severe oxygen desaturation to a nadir of 80.0% at 11 cm of water. - The patient snored with soft snoring volume during this titration study. - No cardiac abnormalities were observed during this study. DIAGNOSIS - Obstructive Sleep Apnea (G47.33)  RECOMMENDATIONS - Recommend an initial trial of CPAP Auto with EPR of 3 at 11- 20 cm H2O and heated humidification.  Upon patient request DME selection is [facility]. Patient understands she/he will be contacted by [facility] to set up her/he cpap. Patient understands to call if [facility] does not contact her/he with new setup in a timely manner. Patient understands they will be called once confirmation has been received from [facility] that they have received their new machine to schedule 10 week follow up appointment.   [facility] notified of new cpap order  Please add to airview Left detailed message on voicemail and informed patient to call back with questions

## 2020-05-27 ENCOUNTER — Encounter: Payer: Self-pay | Admitting: Endocrinology

## 2020-05-27 ENCOUNTER — Ambulatory Visit: Payer: Medicare PPO | Admitting: Endocrinology

## 2020-05-27 ENCOUNTER — Other Ambulatory Visit: Payer: Self-pay

## 2020-05-27 VITALS — BP 118/68 | HR 77 | Ht 63.0 in | Wt 218.0 lb

## 2020-05-27 DIAGNOSIS — E1122 Type 2 diabetes mellitus with diabetic chronic kidney disease: Secondary | ICD-10-CM

## 2020-05-27 DIAGNOSIS — N1831 Chronic kidney disease, stage 3a: Secondary | ICD-10-CM

## 2020-05-27 LAB — POCT GLYCOSYLATED HEMOGLOBIN (HGB A1C): Hemoglobin A1C: 6.5 % — AB (ref 4.0–5.6)

## 2020-05-27 MED ORDER — TOUJEO MAX SOLOSTAR 300 UNIT/ML ~~LOC~~ SOPN
70.0000 [IU] | PEN_INJECTOR | SUBCUTANEOUS | 3 refills | Status: DC
Start: 2020-05-27 — End: 2020-09-02

## 2020-05-27 NOTE — Patient Instructions (Addendum)
Please reduce the Toujeo to 70 units each morning.   check your blood sugar twice a day.  vary the time of day when you check, between before the 3 meals, and at bedtime.  also check if you have symptoms of your blood sugar being too high or too low.  please keep a record of the readings and bring it to your next appointment here (or you can bring the meter itself).  You can write it on any piece of paper.  please call us sooner if your blood sugar goes below 70, or if you have a lot of readings over 200.   Please come back for a follow-up appointment in 3 months.

## 2020-05-27 NOTE — Progress Notes (Signed)
Subjective:    Patient ID: Pamela Love, female    DOB: Dec 23, 1950, 69 y.o.   MRN: [id number]  HPI Pt returns for f/u of diabetes mellitus: DM type: Insulin-requiring type 2 Dx'ed: 4818 Complications: CRI and CAD Therapy: insulin since 2019 GDM: never.  DKA: never.  Severe hypoglycemia: never.  Pancreatitis: never.  Other: she also took insulin for 6 months, in 2016; CRI, vaginitis, and edema limit rx options; she did not tolerate trulicity (nausea), or repaglinide (diarrhea); she declines multiple daily injections.: TWW rx'ed Victoza.   Interval history: she brings a record of her cbg's which I have reviewed today.  cbg varies from 65-170.  She has reduced insulin to 78 units qam.  pt states she feels well in general. Past Medical History:  Diagnosis Date  . Arthritis   . Bursitis    hips  . C. difficile colitis 12/2017  . CKD (chronic kidney disease), stage III ([place])   . Complication of anesthesia    "spinal did not work with second c section"  . Diabetes mellitus ([place]) 11/19/2015  . Diabetes mellitus, type 2 ([place])    diet controlled - has Rx for Glymipride but has not started taking yet  . Dyslipidemia 10/21/2014  . GERD (gastroesophageal reflux disease)   . History of nonmelanoma skin cancer   . History of skin cancer   . Hypercholesterolemia 12/11/2019  . Hyperlipidemia   . Hypertension   . Hypertensive disorder 10/21/2014  . Lower extremity edema   . OAB (overactive bladder)   . Osteoarthritis of right knee 05/15/2015  . Osteoarthrosis, unspecified whether generalized or localized, involving lower leg 10/21/2014  . Osteoporosis   . Over weight   . Primary osteoarthritis of left knee 12/18/2014  . S/P knee replacement 12/18/2014  . S/P total knee replacement using cement 05/15/2015  . Sleep apnea   . Tachycardia 12/11/2019    Past Surgical History:  Procedure Laterality Date  . ABDOMINAL HYSTERECTOMY  2005  . CATARACT EXTRACTION    . CESAREAN SECTION      x2  . colonscopy     . CORONARY STENT INTERVENTION N/A 01/06/2020   Procedure: CORONARY STENT INTERVENTION;  Surgeon: Lorretta Harp, MD;  Location: [facility];  Service: Cardiovascular;  Laterality: N/A;  . EYE SURGERY     bilat cataract surgery 2015  . INTRAVASCULAR PRESSURE WIRE/FFR STUDY N/A 01/06/2020   Procedure: INTRAVASCULAR PRESSURE WIRE/FFR STUDY;  Surgeon: Lorretta Harp, MD;  Location: [facility];  Service: Cardiovascular;  Laterality: N/A;  . LEFT HEART CATH AND CORONARY ANGIOGRAPHY N/A 01/06/2020   Procedure: LEFT HEART CATH AND CORONARY ANGIOGRAPHY;  Surgeon: Lorretta Harp, MD;  Location: [facility];  Service: Cardiovascular;  Laterality: N/A;  . TOTAL KNEE ARTHROPLASTY Left 12/18/2014   Procedure: TOTAL LEFT KNEE ARTHROPLASTY;  Surgeon: Sydnee Cabal, MD;  Location: [facility];  Service: Orthopedics;  Laterality: Left;  . TOTAL KNEE ARTHROPLASTY Right 05/15/2015   Procedure: RIGHT TOTAL KNEE ARTHROPLASTY;  Surgeon: Sydnee Cabal, MD;  Location: [facility];  Service: Orthopedics;  Laterality: Right;  . UTERINE FIBROID SURGERY      Social History   Socioeconomic History  . Marital status: Married    Spouse name: Not on file  . Number of children: Not on file  . Years of education: Not on file  . Highest education level: Not on file  Occupational History  . Occupation: retired  Tobacco Use  . Smoking status:  Never Smoker  . Smokeless tobacco: Never Used  Vaping Use  . Vaping Use: Never used  Substance and Sexual Activity  . Alcohol use: Yes    Comment: rare  . Drug use: No  . Sexual activity: Not on file  Other Topics Concern  . Not on file  Social History Narrative  . Not on file   Social Determinants of Health   Financial Resource Strain:   . Difficulty of Paying Living Expenses: Not on file  Food Insecurity:   . Worried About Charity fundraiser in the Last Year: Not on file  . Ran Out of Food in the Last Year: Not on file   Transportation Needs:   . Lack of Transportation (Medical): Not on file  . Lack of Transportation (Non-Medical): Not on file  Physical Activity:   . Days of Exercise per Week: Not on file  . Minutes of Exercise per Session: Not on file  Stress:   . Feeling of Stress : Not on file  Social Connections:   . Frequency of Communication with Friends and Family: Not on file  . Frequency of Social Gatherings with Friends and Family: Not on file  . Attends Religious Services: Not on file  . Active Member of Clubs or Organizations: Not on file  . Attends Archivist Meetings: Not on file  . Marital Status: Not on file  Intimate Partner Violence:   . Fear of Current or Ex-Partner: Not on file  . Emotionally Abused: Not on file  . Physically Abused: Not on file  . Sexually Abused: Not on file    Current Outpatient Medications on File Prior to Visit  Medication Sig Dispense Refill  . Accu-Chek Softclix Lancets lancets 1 each by Other route in the morning and at bedtime. E11.9 180 each 0  . acetaminophen (TYLENOL ARTHRITIS PAIN) 650 MG CR tablet Take 1,300 mg by mouth at bedtime.     Marland Kitchen aspirin EC 81 MG tablet Take 1 tablet (81 mg total) by mouth daily. 90 tablet 3  . Blood Glucose Monitoring Suppl (ACCU-CHEK GUIDE ME) w/Device KIT 1 each by Does not apply route 2 (two) times daily. E11.9 1 kit 0  . carvedilol (COREG) 3.125 MG tablet Take 1 tablet (3.125 mg total) by mouth 2 (two) times daily with a meal. 60 tablet 11  . Cholecalciferol (VITAMIN D) 2000 UNITS CAPS Take 2,000 Units by mouth every morning.     . clopidogrel (PLAVIX) 75 MG tablet Take 1 tablet (75 mg total) by mouth daily with breakfast. 30 tablet 11  . Coenzyme Q10 (COQ10) 200 MG CAPS Take 200 mg by mouth daily.     . diclofenac sodium (VOLTAREN) 1 % GEL Apply 1 application topically at bedtime as needed (knee pain.).     Marland Kitchen DULoxetine (CYMBALTA) 20 MG capsule Take 40 mg by mouth at bedtime.     Marland Kitchen glucose blood (ACCU-CHEK  GUIDE) test strip 1 each by Other route 2 (two) times daily. E11.9 200 each 0  . Insulin Pen Needle (BD PEN NEEDLE NANO U/F) 32G X 4 MM MISC 1 each by Does not apply route daily. 100 each 0  . liraglutide (VICTOZA) 18 MG/3ML SOPN Inject 1.8 mg into the skin daily. 9 mL 0  . Multiple Vitamin (MULTIVITAMIN WITH MINERALS) TABS tablet Take 1 tablet by mouth every morning.     . Omega-3 Fatty Acids (RA FISH OIL) 1400 MG CPDR Take 1,400 mg by mouth 2 (two) times  daily.    . potassium chloride SA (KLOR-CON) 20 MEQ tablet Take 1 tablet (20 mEq total) by mouth 3 (three) times a week. 36 tablet 3  . ranolazine (RANEXA) 500 MG 12 hr tablet Take 1 tablet (500 mg total) by mouth 2 (two) times daily. 60 tablet 2  . rosuvastatin (CRESTOR) 40 MG tablet Take 40 mg by mouth daily.     Marland Kitchen saccharomyces boulardii (FLORASTOR) 250 MG capsule Take 250 mg by mouth daily.    . trospium (SANCTURA) 20 MG tablet Take 20 mg by mouth 2 (two) times daily.  3  . valsartan-hydrochlorothiazide (DIOVAN-HCT) 160-12.5 MG tablet Take 1 tablet by mouth daily.    . vitamin C (ASCORBIC ACID) 250 MG tablet Take 250 mg by mouth daily.    . furosemide (LASIX) 40 MG tablet Take 1 tablet (40 mg total) by mouth 3 (three) times a week. 36 tablet 3  . nitroGLYCERIN (NITROSTAT) 0.4 MG SL tablet Place 1 tablet (0.4 mg total) under the tongue every 5 (five) minutes as needed for chest pain. 90 tablet 3   No current facility-administered medications on file prior to visit.    Allergies  Allergen Reactions  . Colchicine     Stomach cramps  . Diclofenac     Oral causes stomach cramps  . Janumet [Sitagliptin-Metformin Hcl] Nausea And Vomiting    Lightheaded   . Septra [Sulfamethoxazole-Trimethoprim] Itching  . Trulicity [Dulaglutide] Diarrhea and Nausea And Vomiting  . Zetia [Ezetimibe]     Muscle pain  . Imipramine Rash  . Tape Rash    Paper tape ok to use     Family History  Problem Relation Age of Onset  . Diabetes Maternal  Grandfather   . Heart attack Maternal Grandfather   . Heart disease Mother   . Hypertension Mother   . Hyperlipidemia Mother   . Cancer Mother   . Heart attack Father   . Hypertension Father   . Hyperlipidemia Father   . Heart disease Father   . Sudden death Father   . Sleep apnea Father   . Heart attack Sister   . Heart attack Paternal Uncle   . Congenital heart disease Son     BP 118/68   Pulse 77   Ht _0  (1.6 m)   Wt 218 lb (98.9 kg)   SpO2 97%   BMI 38.62 kg/m    Review of Systems Denies n/v.  She has lost a few lbs.      Objective:   Physical Exam VITAL SIGNS:  See vs page GENERAL: no distress Pulses: dorsalis pedis intact bilat.   MSK: no deformity of the feet CV: no leg edema Skin:  no ulcer on the feet.  normal color and temp on the feet. Neuro: sensation is intact to touch on the feet  Lab Results  Component Value Date   HGBA1C 6.5 (A) 05/27/2020       Assessment & Plan:  Insulin-requiring type 2 DM, with CRI Hypoglycemia, due to insulin: this limits aggressiveness of glycemic control    Patient Instructions  Please reduce the Toujeo to 70 units each morning.   check your blood sugar twice a day.  vary the time of day when you check, between before the 3 meals, and at bedtime.  also check if you have symptoms of your blood sugar being too high or too low.  please keep a record of the readings and bring it to your next appointment here (or you  can bring the meter itself).  You can write it on any piece of paper.  please call us sooner if your blood sugar goes below 70, or if you have a lot of readings over 200.   Please come back for a follow-up appointment in 3 months.

## 2020-06-02 ENCOUNTER — Encounter (INDEPENDENT_AMBULATORY_CARE_PROVIDER_SITE_OTHER): Payer: Self-pay | Admitting: Family Medicine

## 2020-06-02 ENCOUNTER — Other Ambulatory Visit: Payer: Self-pay

## 2020-06-02 ENCOUNTER — Ambulatory Visit (INDEPENDENT_AMBULATORY_CARE_PROVIDER_SITE_OTHER): Payer: Medicare PPO | Admitting: Family Medicine

## 2020-06-02 VITALS — BP 113/72 | HR 74 | Temp 97.8°F | Ht 63.0 in | Wt 215.0 lb

## 2020-06-02 DIAGNOSIS — Z6838 Body mass index (BMI) 38.0-38.9, adult: Secondary | ICD-10-CM

## 2020-06-02 DIAGNOSIS — R140 Abdominal distension (gaseous): Secondary | ICD-10-CM

## 2020-06-02 DIAGNOSIS — Z794 Long term (current) use of insulin: Secondary | ICD-10-CM | POA: Diagnosis not present

## 2020-06-02 DIAGNOSIS — R1013 Epigastric pain: Secondary | ICD-10-CM | POA: Diagnosis not present

## 2020-06-02 DIAGNOSIS — E1169 Type 2 diabetes mellitus with other specified complication: Secondary | ICD-10-CM | POA: Diagnosis not present

## 2020-06-03 NOTE — Progress Notes (Signed)
Chief Complaint:   OBESITY Pamela Love is here to discuss her progress with her obesity treatment plan along with follow-up of her obesity related diagnoses.   Today's visit was #: 6 Starting weight: 227 lbs Starting date: 03/17/2020 Today's weight: 215 lbs Today's date: 06/02/2020 Total lbs lost to date: 12 lbs Body mass index is 38.09 kg/m.  Total weight loss percentage to date: -5.29%  Interim History:  I have reviewed Pamela Love's last Endo note from 05/27/2020.  A1c was 6.5.  She has signed up for exercise.  She says she was hungry yesterday, which is new. Nutrition Plan: the Category 2 Plan for 50% of the time.  Activity: None.  Assessment/Plan:   1. Type 2 diabetes mellitus with other specified complication, with long-term current use of insulin (HCC) Diabetes Mellitus: well controlled. Medication: Toujeo, Victoza. Issues reviewed with her: blood sugar goals, complications of diabetes mellitus, hypoglycemia prevention and treatment, exercise, nutrition, and carbohydrate counting.   A1c is 6.5.  She is on Victoza 1.8 mg subcutaneously daily. Victoza causes mild GI upset. Takes MiraLAX daily.  Decreased insulin to 60 units.  Lab Results  Component Value Date   HGBA1C 6.5 (A) 05/27/2020   HGBA1C 7.9 (H) 04/21/2020   HGBA1C 8.0 (A) 02/20/2020   Lab Results  Component Value Date   LDLCALC 71 04/21/2020   CREATININE 1.17 (H) 05/12/2020   2. Dyspepsia Pamela Love is taking pantoprazole 40 mg and Pepcid OTC. We will continue to monitor symptoms as they relate to her weight loss journey.  3. Abdominal bloating Discussed Victoza side effects profile, monitoring foods that make symptoms worse, OTC symptomatic treatment.  4. Class 2 severe obesity with serious comorbidity and body mass index (BMI) of 38.0 to 38.9 in adult, unspecified obesity type Pamela Love)  Course: Pamela Love is currently in the action stage of change. As such, her goal is to continue with weight loss efforts.   Nutrition  goals: She has agreed to the Category 2 Plan.   Exercise goals: For substantial health benefits, adults should do at least 150 minutes (2 hours and 30 minutes) a week of moderate-intensity, or 75 minutes (1 hour and 15 minutes) a week of vigorous-intensity aerobic physical activity, or an equivalent combination of moderate- and vigorous-intensity aerobic activity. Aerobic activity should be performed in episodes of at least 10 minutes, and preferably, it should be spread throughout the week.  Behavioral modification strategies: decreasing simple carbohydrates, increasing water intake and increasing high fiber foods.  Avoid dairy for 2 weeks.  Pamela Love has agreed to follow-up with our clinic in 2 weeks. She was informed of the importance of frequent follow-up visits to maximize her success with intensive lifestyle modifications for her multiple health conditions.   Objective:   Blood pressure 113/72, pulse 74, temperature 97.8 F (36.6 C), temperature source Oral, height 5\' 3"  (1.6 m), weight 215 lb (97.5 kg), SpO2 95 %. Body mass index is 38.09 kg/m.  General: Cooperative, alert, well developed, in no acute distress. HEENT: Conjunctivae and lids unremarkable. Cardiovascular: Regular rhythm.  Lungs: Normal work of breathing. Neurologic: No focal deficits.   Lab Results  Component Value Date   CREATININE 1.17 (H) 05/12/2020   BUN 43 (H) 05/12/2020   NA 140 05/12/2020   K 3.6 05/12/2020   CL 100 05/12/2020   CO2 24 05/12/2020   Lab Results  Component Value Date   ALT 25 04/21/2020   AST 25 04/21/2020   ALKPHOS 104 04/21/2020   BILITOT 0.5 04/21/2020  Lab Results  Component Value Date   HGBA1C 6.5 (A) 05/27/2020   HGBA1C 7.9 (H) 04/21/2020   HGBA1C 8.0 (A) 02/20/2020   HGBA1C 7.7 (A) 10/07/2019   HGBA1C 7.3 (A) 05/14/2019   Lab Results  Component Value Date   TSH 1.71 05/30/2018   Lab Results  Component Value Date   CHOL 140 04/21/2020   HDL 38 (L) 04/21/2020    LDLCALC 71 04/21/2020   TRIG 182 (H) 04/21/2020   CHOLHDL 3.7 04/21/2020   Lab Results  Component Value Date   WBC 7.1 01/07/2020   HGB 13.2 01/07/2020   HCT 39.1 01/07/2020   MCV 90.3 01/07/2020   PLT 225 01/07/2020   Obesity Behavioral Intervention:   Approximately 15 minutes were spent on the discussion below.  ASK: We discussed the diagnosis of obesity with Pamela Love today and Pamela Love agreed to give [place] permission to discuss obesity behavioral modification therapy today.  ASSESS: Pamela Love has the diagnosis of obesity and her BMI today is 38.2. Pamela Love is in the action stage of change.   ADVISE: Pamela Love was educated on the multiple health risks of obesity as well as the benefit of weight loss to improve her health. She was advised of the need for long term treatment and the importance of lifestyle modifications to improve her current health and to decrease her risk of future health problems.  AGREE: Multiple dietary modification options and treatment options were discussed and Pamela Love agreed to follow the recommendations documented in the above note.  ARRANGE: Pamela Love was educated on the importance of frequent visits to treat obesity as outlined per CMS and USPSTF guidelines and agreed to schedule her next follow up appointment today.  Attestation Statements:   Reviewed by clinician on day of visit: allergies, medications, problem list, medical history, surgical history, family history, social history, and previous encounter notes.  I, Insurance claims handler, CMA, am acting as transcriptionist for Helane Rima, DO  I have reviewed the above documentation for accuracy and completeness, and I agree with the above. Helane Rima, DO

## 2020-06-10 ENCOUNTER — Other Ambulatory Visit (INDEPENDENT_AMBULATORY_CARE_PROVIDER_SITE_OTHER): Payer: Self-pay | Admitting: Physician Assistant

## 2020-06-10 DIAGNOSIS — E785 Hyperlipidemia, unspecified: Secondary | ICD-10-CM

## 2020-06-10 DIAGNOSIS — E1169 Type 2 diabetes mellitus with other specified complication: Secondary | ICD-10-CM

## 2020-06-10 NOTE — Telephone Encounter (Signed)
Would you like for me to refill rx? She has an upcoming appointment 06/16/2020?

## 2020-06-10 NOTE — Telephone Encounter (Signed)
Please route to Dr. Earlene Plater Thanks

## 2020-06-10 NOTE — Telephone Encounter (Signed)
This patient was last seen by Dr. Earlene Plater, and currently has an upcoming appt scheduled on 06/16/20 with her.

## 2020-06-15 ENCOUNTER — Other Ambulatory Visit: Payer: Self-pay

## 2020-06-15 ENCOUNTER — Ambulatory Visit: Payer: Medicare PPO | Admitting: Cardiology

## 2020-06-15 VITALS — BP 110/70 | HR 84 | Ht 63.0 in | Wt 216.6 lb

## 2020-06-15 DIAGNOSIS — I2510 Atherosclerotic heart disease of native coronary artery without angina pectoris: Secondary | ICD-10-CM | POA: Diagnosis not present

## 2020-06-15 DIAGNOSIS — R600 Localized edema: Secondary | ICD-10-CM | POA: Insufficient documentation

## 2020-06-15 DIAGNOSIS — M719 Bursopathy, unspecified: Secondary | ICD-10-CM | POA: Insufficient documentation

## 2020-06-15 DIAGNOSIS — I10 Essential (primary) hypertension: Secondary | ICD-10-CM | POA: Diagnosis not present

## 2020-06-15 DIAGNOSIS — N183 Chronic kidney disease, stage 3 unspecified: Secondary | ICD-10-CM | POA: Insufficient documentation

## 2020-06-15 DIAGNOSIS — T8859XA Other complications of anesthesia, initial encounter: Secondary | ICD-10-CM | POA: Insufficient documentation

## 2020-06-15 DIAGNOSIS — G4733 Obstructive sleep apnea (adult) (pediatric): Secondary | ICD-10-CM | POA: Diagnosis not present

## 2020-06-15 DIAGNOSIS — E1159 Type 2 diabetes mellitus with other circulatory complications: Secondary | ICD-10-CM | POA: Insufficient documentation

## 2020-06-15 DIAGNOSIS — E669 Obesity, unspecified: Secondary | ICD-10-CM

## 2020-06-15 DIAGNOSIS — N3281 Overactive bladder: Secondary | ICD-10-CM | POA: Insufficient documentation

## 2020-06-15 DIAGNOSIS — E785 Hyperlipidemia, unspecified: Secondary | ICD-10-CM | POA: Insufficient documentation

## 2020-06-15 DIAGNOSIS — Z85828 Personal history of other malignant neoplasm of skin: Secondary | ICD-10-CM | POA: Insufficient documentation

## 2020-06-15 DIAGNOSIS — E663 Overweight: Secondary | ICD-10-CM | POA: Insufficient documentation

## 2020-06-15 DIAGNOSIS — G4730 Sleep apnea, unspecified: Secondary | ICD-10-CM | POA: Insufficient documentation

## 2020-06-15 DIAGNOSIS — K219 Gastro-esophageal reflux disease without esophagitis: Secondary | ICD-10-CM | POA: Insufficient documentation

## 2020-06-15 DIAGNOSIS — M810 Age-related osteoporosis without current pathological fracture: Secondary | ICD-10-CM | POA: Insufficient documentation

## 2020-06-15 DIAGNOSIS — E119 Type 2 diabetes mellitus without complications: Secondary | ICD-10-CM | POA: Insufficient documentation

## 2020-06-15 DIAGNOSIS — M1990 Unspecified osteoarthritis, unspecified site: Secondary | ICD-10-CM | POA: Insufficient documentation

## 2020-06-15 NOTE — Progress Notes (Signed)
Cardiology Office Note:    Date:  06/15/2020   ID:  Pamela Love, DOB February 12, 1951, MRN [id number]  PCP:  Pamela Neer, MD  Cardiologist:  Pamela Salines, DO  Electrophysiologist:  None   Referring MD: Pamela Neer, MD   I am feeling better since the new medicine.   History of Present Illness:    Pamela Love is a 69 y.o. female with a hx of coronary artery disease status post stent on January 06, 2020 to RCA as a result of a positive stress test, hypertension, diabetes mellitus, hyperlipidemia and sleep apnea.  I last saw the patient in October 2021 at that time she was experiencing some stable angina I added Ranexa 500 mg twice a day to her medical regimen.  She is here today for follow-up visit.  She is happy with the Ranexa and tells me that she had not had any repeated episodes since she was started on this medicine.  She has come back today getting and has continued to exercise.  Past Medical History:  Diagnosis Date  . Abnormal nuclear stress test 01/02/2020  . Arthritis   . Bilateral leg edema 01/02/2020  . Bursitis    hips  . C. difficile colitis 12/2017  . CAD (coronary artery disease) 01/06/2020  . CKD (chronic kidney disease), stage III ([place])   . Complication of anesthesia    "spinal did not work with second c section"  . Diabetes mellitus ([place]) 11/19/2015  . Diabetes mellitus, type 2 ([place])    diet controlled - has Rx for Glymipride but has not started taking yet  . Dyslipidemia 10/21/2014  . GERD (gastroesophageal reflux disease)   . History of nonmelanoma skin cancer   . History of skin cancer   . Hypercholesterolemia 12/11/2019  . Hyperlipidemia   . Hypertension   . Hypertensive disorder 10/21/2014  . Lower extremity edema   . Mixed hyperlipidemia 01/02/2020  . OAB (overactive bladder)   . Obesity (BMI 30-39.9) 05/12/2020  . OSA (obstructive sleep apnea) 05/12/2020  . Osteoarthritis of right knee 05/15/2015  . Osteoarthrosis, unspecified whether  generalized or localized, involving lower leg 10/21/2014  . Osteoporosis   . Over weight   . Primary osteoarthritis of left knee 12/18/2014  . S/P knee replacement 12/18/2014  . S/P total knee replacement using cement 05/15/2015  . Sleep apnea   . Tachycardia 12/11/2019    Past Surgical History:  Procedure Laterality Date  . ABDOMINAL HYSTERECTOMY  2005  . CATARACT EXTRACTION    . CESAREAN SECTION     x2  . colonscopy     . CORONARY STENT INTERVENTION N/A 01/06/2020   Procedure: CORONARY STENT INTERVENTION;  Surgeon: Lorretta Harp, MD;  Location: [facility];  Service: Cardiovascular;  Laterality: N/A;  . EYE SURGERY     bilat cataract surgery 2015  . INTRAVASCULAR PRESSURE WIRE/FFR STUDY N/A 01/06/2020   Procedure: INTRAVASCULAR PRESSURE WIRE/FFR STUDY;  Surgeon: Lorretta Harp, MD;  Location: [facility];  Service: Cardiovascular;  Laterality: N/A;  . LEFT HEART CATH AND CORONARY ANGIOGRAPHY N/A 01/06/2020   Procedure: LEFT HEART CATH AND CORONARY ANGIOGRAPHY;  Surgeon: Lorretta Harp, MD;  Location: [facility];  Service: Cardiovascular;  Laterality: N/A;  . TOTAL KNEE ARTHROPLASTY Left 12/18/2014   Procedure: TOTAL LEFT KNEE ARTHROPLASTY;  Surgeon: Sydnee Cabal, MD;  Location: [facility];  Service: Orthopedics;  Laterality: Left;  . TOTAL KNEE ARTHROPLASTY Right 05/15/2015   Procedure: RIGHT TOTAL  KNEE ARTHROPLASTY;  Surgeon: Sydnee Cabal, MD;  Location: [facility];  Service: Orthopedics;  Laterality: Right;  . UTERINE FIBROID SURGERY      Current Medications: Current Meds  Medication Sig  . Accu-Chek Softclix Lancets lancets 1 each by Other route in the morning and at bedtime. E11.9  . acetaminophen (TYLENOL ARTHRITIS PAIN) 650 MG CR tablet Take 1,300 mg by mouth at bedtime.   Marland Kitchen aspirin EC 81 MG tablet Take 1 tablet (81 mg total) by mouth daily.  . Blood Glucose Monitoring Suppl (ACCU-CHEK GUIDE ME) w/Device KIT 1 each by Does not apply route 2 (two) times  daily. E11.9  . carvedilol (COREG) 3.125 MG tablet Take 1 tablet (3.125 mg total) by mouth 2 (two) times daily with a meal.  . Cholecalciferol (VITAMIN D) 2000 UNITS CAPS Take 2,000 Units by mouth every morning.   . clopidogrel (PLAVIX) 75 MG tablet Take 1 tablet (75 mg total) by mouth daily with breakfast.  . Coenzyme Q10 (COQ10) 200 MG CAPS Take 200 mg by mouth daily.   . diclofenac sodium (VOLTAREN) 1 % GEL Apply 1 application topically at bedtime as needed (knee pain.).   Marland Kitchen DULoxetine (CYMBALTA) 20 MG capsule Take 40 mg by mouth at bedtime.   Marland Kitchen glucose blood (ACCU-CHEK GUIDE) test strip 1 each by Other route 2 (two) times daily. E11.9  . insulin glargine, 2 Unit Dial, (TOUJEO MAX SOLOSTAR) 300 UNIT/ML Solostar Pen Inject 70 Units into the skin every morning. And pen needles 1/day (Patient taking differently: Inject 60 Units into the skin every morning. And pen needles 1/day)  . Insulin Pen Needle (BD PEN NEEDLE NANO U/F) 32G X 4 MM MISC 1 each by Does not apply route daily.  . Multiple Vitamin (MULTIVITAMIN WITH MINERALS) TABS tablet Take 1 tablet by mouth every morning.   . Omega-3 Fatty Acids (RA FISH OIL) 1400 MG CPDR Take 1,400 mg by mouth 2 (two) times daily.  . potassium chloride SA (KLOR-CON) 20 MEQ tablet Take 1 tablet (20 mEq total) by mouth 3 (three) times a week.  . ranolazine (RANEXA) 500 MG 12 hr tablet Take 1 tablet (500 mg total) by mouth 2 (two) times daily.  . rosuvastatin (CRESTOR) 40 MG tablet Take 40 mg by mouth daily.   Marland Kitchen saccharomyces boulardii (FLORASTOR) 250 MG capsule Take 250 mg by mouth daily.  . trospium (SANCTURA) 20 MG tablet Take 20 mg by mouth 2 (two) times daily.  . valsartan-hydrochlorothiazide (DIOVAN-HCT) 160-12.5 MG tablet Take 1 tablet by mouth daily.  Marland Kitchen VICTOZA 18 MG/3ML SOPN INJECT 1.8 MG UNDER THE SKIN ONCE DAILY  . vitamin C (ASCORBIC ACID) 250 MG tablet Take 250 mg by mouth daily.     Allergies:   Colchicine, Diclofenac, Janumet  [sitagliptin-metformin hcl], Septra [sulfamethoxazole-trimethoprim], Trulicity [dulaglutide], Zetia [ezetimibe], Imipramine, and Tape   Social History   Socioeconomic History  . Marital status: Married    Spouse name: Not on file  . Number of children: Not on file  . Years of education: Not on file  . Highest education level: Not on file  Occupational History  . Occupation: retired  Tobacco Use  . Smoking status: Never Smoker  . Smokeless tobacco: Never Used  Vaping Use  . Vaping Use: Never used  Substance and Sexual Activity  . Alcohol use: Yes    Comment: rare  . Drug use: No  . Sexual activity: Not on file  Other Topics Concern  . Not on file  Social History  Narrative  . Not on file   Social Determinants of Health   Financial Resource Strain:   . Difficulty of Paying Living Expenses: Not on file  Food Insecurity:   . Worried About Charity fundraiser in the Last Year: Not on file  . Ran Out of Food in the Last Year: Not on file  Transportation Needs:   . Lack of Transportation (Medical): Not on file  . Lack of Transportation (Non-Medical): Not on file  Physical Activity:   . Days of Exercise per Week: Not on file  . Minutes of Exercise per Session: Not on file  Stress:   . Feeling of Stress : Not on file  Social Connections:   . Frequency of Communication with Friends and Family: Not on file  . Frequency of Social Gatherings with Friends and Family: Not on file  . Attends Religious Services: Not on file  . Active Member of Clubs or Organizations: Not on file  . Attends Archivist Meetings: Not on file  . Marital Status: Not on file     Family History: The patient's family history includes Cancer in her mother; Congenital heart disease in her son; Diabetes in her maternal grandfather; Heart attack in her father, maternal grandfather, paternal uncle, and sister; Heart disease in her father and mother; Hyperlipidemia in her father and mother; Hypertension  in her father and mother; Sleep apnea in her father; Sudden death in her father.  ROS:   Review of Systems  Constitution: Negative for decreased appetite, fever and weight gain.  HENT: Negative for congestion, ear discharge, hoarse voice and sore throat.   Eyes: Negative for discharge, redness, vision loss in right eye and visual halos.  Cardiovascular: Negative for chest pain, dyspnea on exertion, leg swelling, orthopnea and palpitations.  Respiratory: Negative for cough, hemoptysis, shortness of breath and snoring.   Endocrine: Negative for heat intolerance and polyphagia.  Hematologic/Lymphatic: Negative for bleeding problem. Does not bruise/bleed easily.  Skin: Negative for flushing, nail changes, rash and suspicious lesions.  Musculoskeletal: Negative for arthritis, joint pain, muscle cramps, myalgias, neck pain and stiffness.  Gastrointestinal: Negative for abdominal pain, bowel incontinence, diarrhea and excessive appetite.  Genitourinary: Negative for decreased libido, genital sores and incomplete emptying.  Neurological: Negative for brief paralysis, focal weakness, headaches and loss of balance.  Psychiatric/Behavioral: Negative for altered mental status, depression and suicidal ideas.  Allergic/Immunologic: Negative for HIV exposure and persistent infections.    EKGs/Labs/Other Studies Reviewed:    The following studies were reviewed today:   EKG: None today.  Recent Labs: 01/07/2020: Hemoglobin 13.2; Platelets 225 04/21/2020: ALT 25 05/12/2020: BUN 43; Creatinine, Ser 1.17; Magnesium 2.0; Potassium 3.6; Sodium 140  Recent Lipid Panel    Component Value Date/Time   CHOL 140 04/21/2020 1209   TRIG 182 (H) 04/21/2020 1209   HDL 38 (L) 04/21/2020 1209   CHOLHDL 3.7 04/21/2020 1209   LDLCALC 71 04/21/2020 1209    Physical Exam:    VS:  BP 110/70   Pulse 84   Ht _0  (1.6 m)   Wt 216 lb 9.6 oz (98.2 kg)   SpO2 98%   BMI 38.37 kg/m     Wt Readings from Last 3  Encounters:  06/15/20 216 lb 9.6 oz (98.2 kg)  06/02/20 215 lb (97.5 kg)  05/27/20 218 lb (98.9 kg)     GEN: Well nourished, well developed in no acute distress HEENT: Normal NECK: No JVD; No carotid bruits LYMPHATICS: No lymphadenopathy  CARDIAC: S1S2 noted,RRR, no murmurs, rubs, gallops RESPIRATORY:  Clear to auscultation without rales, wheezing or rhonchi  ABDOMEN: Soft, non-tender, non-distended, +bowel sounds, no guarding. EXTREMITIES: No edema, No cyanosis, no clubbing MUSCULOSKELETAL:  No deformity  SKIN: Warm and dry NEUROLOGIC:  Alert and oriented x 3, non-focal PSYCHIATRIC:  Normal affect, good insight  ASSESSMENT:    1. Coronary artery disease involving native coronary artery of native heart without angina pectoris   2. Primary hypertension   3. OSA (obstructive sleep apnea)   4. Obesity (BMI 30-39.9)    PLAN:      She appears to be doing well from a cardiovascular standpoint.  She is on Ranexa 500 mg twice daily.  Along with her dual antiplatelet therapy, statin and beta-blocker.  She had a lot of questions about Ranexa in the office today and was able to answer this.  She also did tell me that she was told that she needs to be fitted for CPAP and that information has been sent to her insurance company.  She is going to follow-up with them to get information on this.  The patient understands the need to lose weight with diet and exercise. We have discussed specific strategies for this.  The patient is in agreement with the above plan. The patient left the office in stable condition.  The patient will follow up in 6 months or sooner if needed.   Medication Adjustments/Labs and Tests Ordered: Current medicines are reviewed at length with the patient today.  Concerns regarding medicines are outlined above.  No orders of the defined types were placed in this encounter.  No orders of the defined types were placed in this encounter.   There are no Patient  Instructions on file for this visit.   Adopting a Healthy Lifestyle.  Know what a healthy weight is for you (roughly BMI <25) and aim to maintain this   Aim for 7+ servings of fruits and vegetables daily   65-80+ fluid ounces of water or unsweet tea for healthy kidneys   Limit to max 1 drink of alcohol per day; avoid smoking/tobacco   Limit animal fats in diet for cholesterol and heart health - choose grass fed whenever available   Avoid highly processed foods, and foods high in saturated/trans fats   Aim for low stress - take time to unwind and care for your mental health   Aim for 150 min of moderate intensity exercise weekly for heart health, and weights twice weekly for bone health   Aim for 7-9 hours of sleep daily   When it comes to diets, agreement about the perfect plan isnt easy to find, even among the experts. Experts at the [employer] developed an idea known as the Healthy Eating Plate. Just imagine a plate divided into logical, healthy portions.   The emphasis is on diet quality:   Load up on vegetables and fruits - one-half of your plate: Aim for color and variety, and remember that potatoes dont count.   Go for whole grains - one-quarter of your plate: Whole wheat, barley, wheat berries, quinoa, oats, brown rice, and foods made with them. If you want pasta, go with whole wheat pasta.   Protein power - one-quarter of your plate: Fish, chicken, beans, and nuts are all healthy, versatile protein sources. Limit red meat.   The diet, however, does go beyond the plate, offering a few other suggestions.   Use healthy plant oils, such as olive, canola,  soy, corn, sunflower and peanut. Check the labels, and avoid partially hydrogenated oil, which have unhealthy trans fats.   If youre thirsty, drink water. Coffee and tea are good in moderation, but skip sugary drinks and limit milk and dairy products to one or two daily servings.   The type of  carbohydrate in the diet is more important than the amount. Some sources of carbohydrates, such as vegetables, fruits, whole grains, and beans-are healthier than others.   Finally, stay active  Signed, Pamela Salines, DO  06/15/2020 1:32 PM    [facility]

## 2020-06-15 NOTE — Patient Instructions (Signed)

## 2020-06-16 ENCOUNTER — Encounter (INDEPENDENT_AMBULATORY_CARE_PROVIDER_SITE_OTHER): Payer: Self-pay | Admitting: Family Medicine

## 2020-06-16 ENCOUNTER — Ambulatory Visit (INDEPENDENT_AMBULATORY_CARE_PROVIDER_SITE_OTHER): Payer: Medicare PPO | Admitting: Family Medicine

## 2020-06-16 VITALS — BP 117/71 | HR 86 | Temp 98.3°F | Ht 63.0 in | Wt 214.0 lb

## 2020-06-16 DIAGNOSIS — I152 Hypertension secondary to endocrine disorders: Secondary | ICD-10-CM | POA: Diagnosis not present

## 2020-06-16 DIAGNOSIS — Z6837 Body mass index (BMI) 37.0-37.9, adult: Secondary | ICD-10-CM | POA: Diagnosis not present

## 2020-06-16 DIAGNOSIS — E1169 Type 2 diabetes mellitus with other specified complication: Secondary | ICD-10-CM

## 2020-06-16 DIAGNOSIS — E1159 Type 2 diabetes mellitus with other circulatory complications: Secondary | ICD-10-CM

## 2020-06-16 DIAGNOSIS — Z794 Long term (current) use of insulin: Secondary | ICD-10-CM

## 2020-06-16 DIAGNOSIS — E785 Hyperlipidemia, unspecified: Secondary | ICD-10-CM

## 2020-06-17 MED ORDER — VICTOZA 18 MG/3ML ~~LOC~~ SOPN: 1.8000 mg | PEN_INJECTOR | SUBCUTANEOUS | 0 refills | Status: DC

## 2020-06-18 DIAGNOSIS — G4733 Obstructive sleep apnea (adult) (pediatric): Secondary | ICD-10-CM | POA: Diagnosis not present

## 2020-06-18 NOTE — Progress Notes (Signed)
Chief Complaint:   OBESITY Pamela Love is here to discuss her progress with her obesity treatment plan along with follow-up of her obesity related diagnoses.   Today's visit was #: 7 Starting weight: 227 lbs Starting date: 03/17/2020 Today's weight: 214 lbs Today's date: 06/16/2020 Total lbs lost to date: 13 lbs Body mass index is 37.91 kg/m.  Total weight loss percentage to date: -5.73%  Interim History: Pamela Love's Cardiology appointment has now been pushed out 6 months, which means that she is doing well.  She says she will be hosting Thanksgiving this year.  She has stopped eating ice cream, which has helped with bloating.  She endorses stress eating M&Ms.  She says she knows she needs to drink more water. She has an appointment on the 29th with Dr. Sherryll Burger.  Will send records. Nutrition Plan: the Category 2 Plan for 65% of the time.  Activity: Cardio, wellness, and dancing for 90 minutes 2-3 times per week.  Assessment/Plan:   1. Type 2 diabetes mellitus with other specified complication, with long-term current use of insulin (HCC) Diabetes Mellitus: improved. Medication: Victoza 1.8 mg subcutaneously weekly and Toujeo 60 in the morning.  Highest blood sugar 150, lowest 85 (with exercise).  Issues reviewed with her: blood sugar goals, complications of diabetes mellitus, hypoglycemia prevention and treatment, exercise, and nutrition.  Lab Results  Component Value Date   HGBA1C 6.5 (A) 05/27/2020   HGBA1C 7.9 (H) 04/21/2020   HGBA1C 8.0 (A) 02/20/2020   Lab Results  Component Value Date   LDLCALC 71 04/21/2020   CREATININE 1.17 (H) 05/12/2020   - Refill liraglutide (VICTOZA) 18 MG/3ML SOPN; Inject 1.8 mg into the skin once a week.  Dispense: 3.6 mL; Refill: 0  2. Hyperlipidemia with Type 2 diabetes mellitus (HCC) Course: Not at goal.  Lipid-lowering medications: Crestor 40 mg daily.   Plan: Dietary changes: Increase soluble fiber. Decrease simple carbohydrates. Exercise changes:  An average 40 minutes of moderate to vigorous-intensity aerobic activity 3 or 4 times per week.   Lab Results  Component Value Date   CHOL 140 04/21/2020   HDL 38 (L) 04/21/2020   LDLCALC 71 04/21/2020   TRIG 182 (H) 04/21/2020   CHOLHDL 3.7 04/21/2020   Lab Results  Component Value Date   ALT 25 04/21/2020   AST 25 04/21/2020   ALKPHOS 104 04/21/2020   BILITOT 0.5 04/21/2020   3. Hypertension associated with type 2 diabetes mellitus (HCC) At goal. Medications: Coreg and Diovan.   Plan: Avoid buying foods that are: processed, frozen, or prepackaged to avoid excess salt. We will continue to monitor symptoms as they relate to her weight loss journey.  BP Readings from Last 3 Encounters:  06/16/20 117/71  06/15/20 110/70  06/02/20 113/72   Lab Results  Component Value Date   CREATININE 1.17 (H) 05/12/2020   4. Class 2 severe obesity with serious comorbidity and body mass index (BMI) of 37.0 to 37.9 in adult, unspecified obesity type [facility])  Course: Tamia is currently in the action stage of change. As such, her goal is to continue with weight loss efforts.   Nutrition goals: She has agreed to the Category 2 Plan.   Exercise goals: For substantial health benefits, adults should do at least 150 minutes (2 hours and 30 minutes) a week of moderate-intensity, or 75 minutes (1 hour and 15 minutes) a week of vigorous-intensity aerobic physical activity, or an equivalent combination of moderate- and vigorous-intensity aerobic activity. Aerobic activity should be  performed in episodes of at least 10 minutes, and preferably, it should be spread throughout the week.  Behavioral modification strategies: increasing lean protein intake, decreasing simple carbohydrates, increasing vegetables and increasing water intake.  Pamela Love has agreed to follow-up with our clinic in 3 weeks. She was informed of the importance of frequent follow-up visits to maximize her success with intensive lifestyle  modifications for her multiple health conditions.   Objective:   Blood pressure 117/71, pulse 86, temperature 98.3 F (36.8 C), height 5\' 3"  (1.6 m), weight 214 lb (97.1 kg), SpO2 99 %. Body mass index is 37.91 kg/m.  General: Cooperative, alert, well developed, in no acute distress. HEENT: Conjunctivae and lids unremarkable. Cardiovascular: Regular rhythm.  Lungs: Normal work of breathing. Neurologic: No focal deficits.   Lab Results  Component Value Date   CREATININE 1.17 (H) 05/12/2020   BUN 43 (H) 05/12/2020   NA 140 05/12/2020   K 3.6 05/12/2020   CL 100 05/12/2020   CO2 24 05/12/2020   Lab Results  Component Value Date   ALT 25 04/21/2020   AST 25 04/21/2020   ALKPHOS 104 04/21/2020   BILITOT 0.5 04/21/2020   Lab Results  Component Value Date   HGBA1C 6.5 (A) 05/27/2020   HGBA1C 7.9 (H) 04/21/2020   HGBA1C 8.0 (A) 02/20/2020   HGBA1C 7.7 (A) 10/07/2019   HGBA1C 7.3 (A) 05/14/2019   Lab Results  Component Value Date   TSH 1.71 05/30/2018   Lab Results  Component Value Date   CHOL 140 04/21/2020   HDL 38 (L) 04/21/2020   LDLCALC 71 04/21/2020   TRIG 182 (H) 04/21/2020   CHOLHDL 3.7 04/21/2020   Lab Results  Component Value Date   WBC 7.1 01/07/2020   HGB 13.2 01/07/2020   HCT 39.1 01/07/2020   MCV 90.3 01/07/2020   PLT 225 01/07/2020   Obesity Behavioral Intervention:   Approximately 15 minutes were spent on the discussion below.  ASK: We discussed the diagnosis of obesity with Kashvi today and Willean agreed to give Stark Bray permission to discuss obesity behavioral modification therapy today.  ASSESS: Danyal has the diagnosis of obesity and her BMI today is 37.9. Geral is in the action stage of change.   ADVISE: Kirsta was educated on the multiple health risks of obesity as well as the benefit of weight loss to improve her health. She was advised of the need for long term treatment and the importance of lifestyle modifications to improve her current  health and to decrease her risk of future health problems.  AGREE: Multiple dietary modification options and treatment options were discussed and Simrah agreed to follow the recommendations documented in the above note.  ARRANGE: Aviah was educated on the importance of frequent visits to treat obesity as outlined per CMS and USPSTF guidelines and agreed to schedule her next follow up appointment today.  Attestation Statements:   Reviewed by clinician on day of visit: allergies, medications, problem list, medical history, surgical history, family history, social history, and previous encounter notes.  I, Stark Bray, CMA, am acting as transcriptionist for Insurance claims handler, DO  I have reviewed the above documentation for accuracy and completeness, and I agree with the above. Helane Rima, DO

## 2020-06-29 DIAGNOSIS — E1122 Type 2 diabetes mellitus with diabetic chronic kidney disease: Secondary | ICD-10-CM | POA: Diagnosis not present

## 2020-06-29 DIAGNOSIS — I129 Hypertensive chronic kidney disease with stage 1 through stage 4 chronic kidney disease, or unspecified chronic kidney disease: Secondary | ICD-10-CM | POA: Diagnosis not present

## 2020-06-29 DIAGNOSIS — M1990 Unspecified osteoarthritis, unspecified site: Secondary | ICD-10-CM | POA: Diagnosis not present

## 2020-06-29 DIAGNOSIS — E782 Mixed hyperlipidemia: Secondary | ICD-10-CM | POA: Diagnosis not present

## 2020-06-29 DIAGNOSIS — I2510 Atherosclerotic heart disease of native coronary artery without angina pectoris: Secondary | ICD-10-CM | POA: Diagnosis not present

## 2020-06-29 DIAGNOSIS — I209 Angina pectoris, unspecified: Secondary | ICD-10-CM | POA: Diagnosis not present

## 2020-06-29 DIAGNOSIS — Z0000 Encounter for general adult medical examination without abnormal findings: Secondary | ICD-10-CM | POA: Diagnosis not present

## 2020-06-29 DIAGNOSIS — G4733 Obstructive sleep apnea (adult) (pediatric): Secondary | ICD-10-CM | POA: Diagnosis not present

## 2020-06-30 ENCOUNTER — Other Ambulatory Visit: Payer: Self-pay

## 2020-06-30 ENCOUNTER — Ambulatory Visit (INDEPENDENT_AMBULATORY_CARE_PROVIDER_SITE_OTHER): Payer: Medicare PPO | Admitting: Physician Assistant

## 2020-06-30 ENCOUNTER — Encounter (INDEPENDENT_AMBULATORY_CARE_PROVIDER_SITE_OTHER): Payer: Self-pay | Admitting: Physician Assistant

## 2020-06-30 VITALS — BP 102/64 | HR 81 | Temp 97.6°F | Ht 63.0 in | Wt 212.0 lb

## 2020-06-30 DIAGNOSIS — E1169 Type 2 diabetes mellitus with other specified complication: Secondary | ICD-10-CM

## 2020-06-30 DIAGNOSIS — Z794 Long term (current) use of insulin: Secondary | ICD-10-CM

## 2020-06-30 DIAGNOSIS — E1122 Type 2 diabetes mellitus with diabetic chronic kidney disease: Secondary | ICD-10-CM

## 2020-06-30 DIAGNOSIS — Z6837 Body mass index (BMI) 37.0-37.9, adult: Secondary | ICD-10-CM

## 2020-06-30 DIAGNOSIS — E559 Vitamin D deficiency, unspecified: Secondary | ICD-10-CM | POA: Diagnosis not present

## 2020-06-30 NOTE — Progress Notes (Signed)
Chief Complaint:   OBESITY Pamela Love is here to discuss her progress with her obesity treatment plan along with follow-up of her obesity related diagnoses. Pamela Love is on the Category 2 Plan and states she is following her eating plan approximately 40% of the time. Pamela Love states she is doing aerobics and walking 60 minutes 2 times per week.  Today's visit was #: 8 Starting weight: 227 lbs Starting date: 03/17/2020 Today's weight: 212 lbs Today's date: 06/30/2020 Total lbs lost to date: 15 Total lbs lost since last in-office visit: 2  Interim History: Pamela Love states that she needs quick meals that she can make for her and her husband some nights. She likes the idea of chili and soup this time of year.  Subjective:   Type 2 diabetes mellitus with chronic kidney disease, with long-term current use of insulin, unspecified CKD stage (HCC). Pamela Love sees Dr. Everardo All. Last A1c was 6.5. She states that he wants her A1c at 7.0. She is on Victoza and Toujeo.   Lab Results  Component Value Date   HGBA1C 6.5 (A) 05/27/2020   HGBA1C 7.9 (H) 04/21/2020   HGBA1C 8.0 (A) 02/20/2020   Lab Results  Component Value Date   LDLCALC 71 04/21/2020   CREATININE 1.17 (H) 05/12/2020   No results found for: INSULIN  Vitamin D deficiency. Pamela Love is on Vitamin D 2,000 units daily. Last level was at goal.   Ref. Range 04/21/2020 12:09  Vitamin D, 25-Hydroxy Latest Ref Range: 30.0 - 100.0 ng/mL 63.9   Assessment/Plan:   Type 2 diabetes mellitus with chronic kidney disease, with long-term current use of insulin, unspecified CKD stage (HCC). Good blood sugar control is important to decrease the likelihood of diabetic complications such as nephropathy, neuropathy, limb loss, blindness, coronary artery disease, and death. Intensive lifestyle modification including diet, exercise and weight loss are the first line of treatment for diabetes. Pamela Love will follow-up with Dr. Everardo All as scheduled and continue  Victoza and Toujeo as directed.   Vitamin D deficiency. Low Vitamin D level contributes to fatigue and are associated with obesity, breast, and colon cancer. She agrees to continue to take Vitamin D as directed and will follow-up for routine testing of Vitamin D, at least 2-3 times per year to avoid over-replacement.  Class 2 severe obesity with serious comorbidity and body mass index (BMI) of 37.0 to 37.9 in adult, unspecified obesity type (HCC).  Pamela Love is currently in the action stage of change. As such, her goal is to continue with weight loss efforts. She has agreed to the Category 2 Plan and will journal 400-500 calories and 35 grams of protein at supper.   She was given yogurt option of Pamela Love for lactose free yogurt.  Exercise goals: Older adults should follow the adult guidelines. When older adults cannot meet the adult guidelines, they should be as physically active as their abilities and conditions will allow.   Behavioral modification strategies: meal planning and cooking strategies and planning for success.  Pamela Love has agreed to follow-up with our clinic in 2 weeks. She was informed of the importance of frequent follow-up visits to maximize her success with intensive lifestyle modifications for her multiple health conditions.   Objective:   Blood pressure 102/64, pulse 81, temperature 97.6 F (36.4 C), height 5\' 3"  (1.6 m), weight 212 lb (96.2 kg), SpO2 97 %. Body mass index is 37.55 kg/m.  General: Cooperative, alert, well developed, in no acute distress. HEENT: Conjunctivae and lids unremarkable.  Cardiovascular: Regular rhythm.  Lungs: Normal work of breathing. Neurologic: No focal deficits.   Lab Results  Component Value Date   CREATININE 1.17 (H) 05/12/2020   BUN 43 (H) 05/12/2020   NA 140 05/12/2020   K 3.6 05/12/2020   CL 100 05/12/2020   CO2 24 05/12/2020   Lab Results  Component Value Date   ALT 25 04/21/2020   AST 25 04/21/2020   ALKPHOS 104  04/21/2020   BILITOT 0.5 04/21/2020   Lab Results  Component Value Date   HGBA1C 6.5 (A) 05/27/2020   HGBA1C 7.9 (H) 04/21/2020   HGBA1C 8.0 (A) 02/20/2020   HGBA1C 7.7 (A) 10/07/2019   HGBA1C 7.3 (A) 05/14/2019   No results found for: INSULIN Lab Results  Component Value Date   TSH 1.71 05/30/2018   Lab Results  Component Value Date   CHOL 140 04/21/2020   HDL 38 (L) 04/21/2020   LDLCALC 71 04/21/2020   TRIG 182 (H) 04/21/2020   CHOLHDL 3.7 04/21/2020   Lab Results  Component Value Date   WBC 7.1 01/07/2020   HGB 13.2 01/07/2020   HCT 39.1 01/07/2020   MCV 90.3 01/07/2020   PLT 225 01/07/2020   No results found for: IRON, TIBC, FERRITIN  Obesity Behavioral Intervention:   Approximately 15 minutes were spent on the discussion below.  ASK: We discussed the diagnosis of obesity with Pamela Love today and Pamela Love agreed to give [place] permission to discuss obesity behavioral modification therapy today.  ASSESS: Pamela Love has the diagnosis of obesity and her BMI today is 37.6. Pamela Love is in the action stage of change.   ADVISE: Pamela Love was educated on the multiple health risks of obesity as well as the benefit of weight loss to improve her health. She was advised of the need for long term treatment and the importance of lifestyle modifications to improve her current health and to decrease her risk of future health problems.  AGREE: Multiple dietary modification options and treatment options were discussed and Pamela Love agreed to follow the recommendations documented in the above note.  ARRANGE: Pamela Love was educated on the importance of frequent visits to treat obesity as outlined per CMS and USPSTF guidelines and agreed to schedule her next follow up appointment today.  Attestation Statements:   Reviewed by clinician on day of visit: allergies, medications, problem list, medical history, surgical history, family history, social history, and previous encounter notes.  IMarianna Payment, am  acting as transcriptionist for Alois Cliche, PA-C   I have reviewed the above documentation for accuracy and completeness, and I agree with the above. Alois Cliche, PA-C

## 2020-07-01 ENCOUNTER — Telehealth: Payer: Self-pay | Admitting: *Deleted

## 2020-07-01 NOTE — Telephone Encounter (Signed)
Patient called complaining of mask leaking aroung her chin and the mask will start flapping. Her cpap is on Auto with EPR of 3 at 11- 20 cm H2O. Message sent to dr Tresa Endo secure chat to Please advise

## 2020-07-02 NOTE — Telephone Encounter (Addendum)
PER DR KELLY: May need adjustment in strap, but decrease Auto to 8- 18;make sure she is cleaning the mask cushion daily with soap/H2)  Order placed to [facility] and Patient notified

## 2020-07-10 ENCOUNTER — Telehealth: Payer: Self-pay | Admitting: Cardiovascular Disease

## 2020-07-10 NOTE — Telephone Encounter (Signed)
Pt called in stated she was to suppose to call back in and make a 90 fu with Dr Tresa Endo but she is unsure from when.  She stated she just got to Cpap a month ago.  She was un sure when she needed to returning   She thinks it was bout nov  18th    Best number -[phone]

## 2020-07-10 NOTE — Telephone Encounter (Signed)
Spoke with patient and she started using her CPAP 11/18 She needs to be see within 90 days of starting for insurance/compliance  Will send message to schedulers to arrange

## 2020-07-14 ENCOUNTER — Ambulatory Visit (INDEPENDENT_AMBULATORY_CARE_PROVIDER_SITE_OTHER): Payer: Medicare PPO | Admitting: Family Medicine

## 2020-07-14 ENCOUNTER — Other Ambulatory Visit: Payer: Self-pay

## 2020-07-14 ENCOUNTER — Encounter (INDEPENDENT_AMBULATORY_CARE_PROVIDER_SITE_OTHER): Payer: Self-pay | Admitting: Family Medicine

## 2020-07-14 VITALS — BP 114/69 | HR 80 | Temp 98.0°F | Ht 63.0 in | Wt 210.0 lb

## 2020-07-14 DIAGNOSIS — I2510 Atherosclerotic heart disease of native coronary artery without angina pectoris: Secondary | ICD-10-CM

## 2020-07-14 DIAGNOSIS — I10 Essential (primary) hypertension: Secondary | ICD-10-CM | POA: Diagnosis not present

## 2020-07-14 DIAGNOSIS — Z6837 Body mass index (BMI) 37.0-37.9, adult: Secondary | ICD-10-CM

## 2020-07-14 DIAGNOSIS — Z794 Long term (current) use of insulin: Secondary | ICD-10-CM

## 2020-07-14 DIAGNOSIS — E1169 Type 2 diabetes mellitus with other specified complication: Secondary | ICD-10-CM

## 2020-07-14 NOTE — Progress Notes (Signed)
Chief Complaint:   OBESITY Pamela Love is here to discuss her progress with her obesity treatment plan along with follow-up of her obesity related diagnoses.   Today's visit was #: 9 Starting weight: 227 lbs Starting date: 03/17/2020 Today's weight: 210 lbs Today's date: 07/14/2020 Total lbs lost to date: 17 lbs Body mass index is 37.2 kg/m.  Total weight loss percentage to date: -7.49%  Interim History: Pamela Love says she is doing well.  She enjoyed Thanksgiving, but did not overeat.  She says she is doing more exercise. Nutrition Plan: the Category 2 Plan for 75% of the time.  Anti-obesity medications: Victoza 1.8 mg subcutaneously daily. Reported side effects: None. Hunger is well controlled. Cravings are well controlled.  Activity: Cardio/water aerobics/dance for 45 minutes 2-4 times per week.  Assessment/Plan:   1. Type 2 diabetes mellitus with other specified complication, with long-term current use of insulin (HCC) Blood sugars are at goal with no lows.  She is taking Victoza and Toujeo. She will continue to focus on protein-rich, low simple carbohydrate foods. We reviewed the importance of hydration, regular exercise for stress reduction, and restorative sleep. The current medical regimen is effective;  continue present plan and medications.  Lab Results  Component Value Date   HGBA1C 6.5 (A) 05/27/2020   HGBA1C 7.9 (H) 04/21/2020   HGBA1C 8.0 (A) 02/20/2020   Lab Results  Component Value Date   LDLCALC 71 04/21/2020   CREATININE 1.17 (H) 05/12/2020   2. Essential hypertension At goal. Medications: Coreg 3.125 mg twice daily and Diovan 0.5 tablets daily.  PCP recently decreased BP medication by half.  Plan: Avoid buying foods that are: processed, frozen, or prepackaged to avoid excess salt. We will continue to monitor symptoms as they relate to her weight loss journey.  BP Readings from Last 3 Encounters:  07/14/20 114/69  06/30/20 102/64  06/16/20 117/71   Lab  Results  Component Value Date   CREATININE 1.17 (H) 05/12/2020   3. Coronary artery disease involving native heart Followed by Cardiology. Notes reviewed. The current medical regimen is effective;  continue present plan and medications. We will continue to monitor symptoms as they relate to her weight loss journey.  4. Class 2 severe obesity with serious comorbidity and body mass index (BMI) of 37.0 to 37.9 in adult, unspecified obesity type Pamela Love)  Course: Pamela Love is currently in the action stage of change. As such, her goal is to continue with weight loss efforts.   Nutrition goals: She has agreed to the Category 2 Plan.   Exercise goals: For substantial health benefits, adults should do at least 150 minutes (2 hours and 30 minutes) a week of moderate-intensity, or 75 minutes (1 hour and 15 minutes) a week of vigorous-intensity aerobic physical activity, or an equivalent combination of moderate- and vigorous-intensity aerobic activity. Aerobic activity should be performed in episodes of at least 10 minutes, and preferably, it should be spread throughout the week.  Behavioral modification strategies: increasing lean protein intake, decreasing simple carbohydrates, increasing vegetables and increasing water intake.  Pamela Love has agreed to follow-up with our clinic in 3 weeks. She was informed of the importance of frequent follow-up visits to maximize her success with intensive lifestyle modifications for her multiple health conditions.   Objective:   Blood pressure 114/69, pulse 80, temperature 98 F (36.7 C), temperature source Oral, height 5\' 3"  (1.6 m), weight 210 lb (95.3 kg), SpO2 96 %. Body mass index is 37.2 kg/m.  General: Cooperative, alert,  well developed, in no acute distress. HEENT: Conjunctivae and lids unremarkable. Cardiovascular: Regular rhythm.  Lungs: Normal work of breathing. Neurologic: No focal deficits.   Lab Results  Component Value Date   CREATININE 1.17 (H)  05/12/2020   BUN 43 (H) 05/12/2020   NA 140 05/12/2020   K 3.6 05/12/2020   CL 100 05/12/2020   CO2 24 05/12/2020   Lab Results  Component Value Date   ALT 25 04/21/2020   AST 25 04/21/2020   ALKPHOS 104 04/21/2020   BILITOT 0.5 04/21/2020   Lab Results  Component Value Date   HGBA1C 6.5 (A) 05/27/2020   HGBA1C 7.9 (H) 04/21/2020   HGBA1C 8.0 (A) 02/20/2020   HGBA1C 7.7 (A) 10/07/2019   HGBA1C 7.3 (A) 05/14/2019   Lab Results  Component Value Date   TSH 1.71 05/30/2018   Lab Results  Component Value Date   CHOL 140 04/21/2020   HDL 38 (L) 04/21/2020   LDLCALC 71 04/21/2020   TRIG 182 (H) 04/21/2020   CHOLHDL 3.7 04/21/2020   Lab Results  Component Value Date   WBC 7.1 01/07/2020   HGB 13.2 01/07/2020   HCT 39.1 01/07/2020   MCV 90.3 01/07/2020   PLT 225 01/07/2020   Obesity Behavioral Intervention:   Approximately 15 minutes were spent on the discussion below.  ASK: We discussed the diagnosis of obesity with Pamela Love today and Pamela Love agreed to give [place] permission to discuss obesity behavioral modification therapy today.  ASSESS: Pamela Love has the diagnosis of obesity and her BMI today is 37.2. Pamela Love is in the action stage of change.   ADVISE: Pamela Love was educated on the multiple health risks of obesity as well as the benefit of weight loss to improve her health. She was advised of the need for long term treatment and the importance of lifestyle modifications to improve her current health and to decrease her risk of future health problems.  AGREE: Multiple dietary modification options and treatment options were discussed and Pamela Love agreed to follow the recommendations documented in the above note.  ARRANGE: Pamela Love was educated on the importance of frequent visits to treat obesity as outlined per CMS and USPSTF guidelines and agreed to schedule her next follow up appointment today.  Attestation Statements:   Reviewed by clinician on day of visit: allergies,  medications, problem list, medical history, surgical history, family history, social history, and previous encounter notes.  I, Insurance claims handler, CMA, am acting as transcriptionist for Helane Rima, DO  I have reviewed the above documentation for accuracy and completeness, and I agree with the above. Helane Rima, DO

## 2020-07-16 ENCOUNTER — Telehealth: Payer: Self-pay | Admitting: Cardiology

## 2020-07-16 ENCOUNTER — Telehealth: Payer: Self-pay | Admitting: Cardiovascular Disease

## 2020-07-16 NOTE — Telephone Encounter (Signed)
Advised to place the pill on a spoon and dissolve it in warm water, then mixing that with applesauce. Advised if this doesn't work, to contact our office to see if the form can be changed to liquid. Verbalized understanding of plan.

## 2020-07-16 NOTE — Telephone Encounter (Signed)
Returned call to patient who states that she needs a sleep compliance appointment with Dr. Tresa Endo with in 90 days of starting her CPAP. Patient started using her CPAP 11/29. Made patient a new sleep patient appointment 09/28/20 at 10:00 am. Advised patient of instructions and address to [facility] office. Patient verbalized understanding of all instructions.

## 2020-07-16 NOTE — Telephone Encounter (Signed)
Pt c/o medication issue:  1. Name of Medication: potassium chloride SA (KLOR-CON) 20 MEQ tablet  2. How are you currently taking this medication (dosage and times per day)? Takes 1 tablet by mouth three times a week.  3. Are you having a reaction (difficulty breathing--STAT)? No.  4. What is your medication issue? Patient states the pill is too big for her to swallow, she wants to know if theres a different form of the pill she can take that would be smaller preferably a capsule Please advise.

## 2020-07-16 NOTE — Telephone Encounter (Signed)
New message:     Patient calling stating she needs a apt with Dr. Tresa Endo for her CPAP. Please call patient

## 2020-07-18 DIAGNOSIS — G4733 Obstructive sleep apnea (adult) (pediatric): Secondary | ICD-10-CM | POA: Diagnosis not present

## 2020-08-03 ENCOUNTER — Encounter (INDEPENDENT_AMBULATORY_CARE_PROVIDER_SITE_OTHER): Payer: Self-pay | Admitting: Physician Assistant

## 2020-08-03 ENCOUNTER — Other Ambulatory Visit: Payer: Self-pay | Admitting: Cardiology

## 2020-08-03 ENCOUNTER — Other Ambulatory Visit: Payer: Self-pay

## 2020-08-03 ENCOUNTER — Ambulatory Visit (INDEPENDENT_AMBULATORY_CARE_PROVIDER_SITE_OTHER): Payer: Medicare PPO | Admitting: Physician Assistant

## 2020-08-03 VITALS — BP 115/74 | HR 81 | Temp 98.2°F | Ht 63.0 in | Wt 213.0 lb

## 2020-08-03 DIAGNOSIS — E1169 Type 2 diabetes mellitus with other specified complication: Secondary | ICD-10-CM | POA: Diagnosis not present

## 2020-08-03 DIAGNOSIS — E785 Hyperlipidemia, unspecified: Secondary | ICD-10-CM | POA: Diagnosis not present

## 2020-08-03 DIAGNOSIS — I152 Hypertension secondary to endocrine disorders: Secondary | ICD-10-CM

## 2020-08-03 DIAGNOSIS — Z6837 Body mass index (BMI) 37.0-37.9, adult: Secondary | ICD-10-CM

## 2020-08-03 DIAGNOSIS — E1159 Type 2 diabetes mellitus with other circulatory complications: Secondary | ICD-10-CM | POA: Diagnosis not present

## 2020-08-05 NOTE — Progress Notes (Signed)
Chief Complaint:   OBESITY Laveda is here to discuss her progress with her obesity treatment plan along with follow-up of her obesity related diagnoses. Renesmae is on the Category 2 Plan and states she is following her eating plan approximately 30% of the time. Amzie states she is doing cardio for 60 minutes 2 times per week.  Today's visit was #: 10 Starting weight: 227 lbs Starting date: 03/17/2020 Today's weight: 213 lbs Today's date: 08/03/2020 Total lbs lost to date: 14 Total lbs lost since last in-office visit: 0  Interim History: Mekaela states that she got off track during the holidays. She is motivated to get back on the plan. She has a sweet tooth and she is looking for healthier options to satisfy that.  Subjective:   1. Hyperlipidemia with Type 2 diabetes mellitus (HCC) Kailiana is followed by Cardiology. Last lipid panel was not at goal. She denies chest pain. She is on Crestor and she denies myalgias.  2. Type 2 diabetes mellitus associated with hypertension (HCC) Jyoti is on Victoza and Toujeo. Last A1c was 6.5. Her fasting BGs range between 137 and 156. She is followed by Dr. Everardo All, Endocrinologist.   Assessment/Plan:   1. Hyperlipidemia with Type 2 diabetes mellitus (HCC) Cardiovascular risk and specific lipid/LDL goals reviewed. We discussed several lifestyle modifications today. Hara will continue her medications and follow-up with Cardiology. She will continue to work on diet, exercise and weight loss efforts. Orders and follow up as documented in patient record.   Counseling Intensive lifestyle modifications are the first line treatment for this issue. . Dietary changes: Increase soluble fiber. Decrease simple carbohydrates. . Exercise changes: Moderate to vigorous-intensity aerobic activity 150 minutes per week if tolerated. . Lipid-lowering medications: see documented in medical record.  2. Type 2 diabetes mellitus associated with hypertension (HCC) Good  blood sugar control is important to decrease the likelihood of diabetic complications such as nephropathy, neuropathy, limb loss, blindness, coronary artery disease, and death. Intensive lifestyle modification including diet, exercise and weight loss are the first line of treatment for diabetes. Jeidy will continue her medications and will follow up with Dr. Everardo All.   3. Class 2 severe obesity with serious comorbidity and body mass index (BMI) of 37.0 to 37.9 in adult, unspecified obesity type (HCC) Ellagrace is currently in the action stage of change. As such, her goal is to continue with weight loss efforts. She has agreed to the Category 2 Plan.   Exercise goals: As is.  Behavioral modification strategies: increasing lean protein intake and decreasing simple carbohydrates.  Velvia has agreed to follow-up with our clinic in 4 weeks. She was informed of the importance of frequent follow-up visits to maximize her success with intensive lifestyle modifications for her multiple health conditions.   Objective:   Blood pressure 115/74, pulse 81, temperature 98.2 F (36.8 C), height 5\' 3"  (1.6 m), weight 213 lb (96.6 kg), SpO2 97 %. Body mass index is 37.73 kg/m.  General: Cooperative, alert, well developed, in no acute distress. HEENT: Conjunctivae and lids unremarkable. Cardiovascular: Regular rhythm.  Lungs: Normal work of breathing. Neurologic: No focal deficits.   Lab Results  Component Value Date   CREATININE 1.17 (H) 05/12/2020   BUN 43 (H) 05/12/2020   NA 140 05/12/2020   K 3.6 05/12/2020   CL 100 05/12/2020   CO2 24 05/12/2020   Lab Results  Component Value Date   ALT 25 04/21/2020   AST 25 04/21/2020   ALKPHOS 104 04/21/2020  BILITOT 0.5 04/21/2020   Lab Results  Component Value Date   HGBA1C 6.5 (A) 05/27/2020   HGBA1C 7.9 (H) 04/21/2020   HGBA1C 8.0 (A) 02/20/2020   HGBA1C 7.7 (A) 10/07/2019   HGBA1C 7.3 (A) 05/14/2019   No results found for: INSULIN Lab Results   Component Value Date   TSH 1.71 05/30/2018   Lab Results  Component Value Date   CHOL 140 04/21/2020   HDL 38 (L) 04/21/2020   LDLCALC 71 04/21/2020   TRIG 182 (H) 04/21/2020   CHOLHDL 3.7 04/21/2020   Lab Results  Component Value Date   WBC 7.1 01/07/2020   HGB 13.2 01/07/2020   HCT 39.1 01/07/2020   MCV 90.3 01/07/2020   PLT 225 01/07/2020   No results found for: IRON, TIBC, FERRITIN  Obesity Behavioral Intervention:   Approximately 15 minutes were spent on the discussion below.  ASK: We discussed the diagnosis of obesity with Kelyn today and Anye agreed to give [place] permission to discuss obesity behavioral modification therapy today.  ASSESS: Moriya has the diagnosis of obesity and her BMI today is 37.74. Mathea is in the action stage of change.   ADVISE: Staysha was educated on the multiple health risks of obesity as well as the benefit of weight loss to improve her health. She was advised of the need for long term treatment and the importance of lifestyle modifications to improve her current health and to decrease her risk of future health problems.  AGREE: Multiple dietary modification options and treatment options were discussed and Willella agreed to follow the recommendations documented in the above note.  ARRANGE: Pamlea was educated on the importance of frequent visits to treat obesity as outlined per CMS and USPSTF guidelines and agreed to schedule her next follow up appointment today.  Attestation Statements:   Reviewed by clinician on day of visit: allergies, medications, problem list, medical history, surgical history, family history, social history, and previous encounter notes.   Trude Mcburney, am acting as transcriptionist for [employer], PA-C.  I have reviewed the above documentation for accuracy and completeness, and I agree with the above. Alois Cliche, PA-C

## 2020-08-13 ENCOUNTER — Ambulatory Visit (INDEPENDENT_AMBULATORY_CARE_PROVIDER_SITE_OTHER): Payer: Medicare PPO | Admitting: Family Medicine

## 2020-08-17 ENCOUNTER — Ambulatory Visit (INDEPENDENT_AMBULATORY_CARE_PROVIDER_SITE_OTHER): Payer: Medicare PPO | Admitting: Family Medicine

## 2020-08-18 ENCOUNTER — Encounter (INDEPENDENT_AMBULATORY_CARE_PROVIDER_SITE_OTHER): Payer: Self-pay

## 2020-08-18 DIAGNOSIS — G4733 Obstructive sleep apnea (adult) (pediatric): Secondary | ICD-10-CM | POA: Diagnosis not present

## 2020-08-19 ENCOUNTER — Encounter (INDEPENDENT_AMBULATORY_CARE_PROVIDER_SITE_OTHER): Payer: Self-pay | Admitting: Family Medicine

## 2020-08-19 ENCOUNTER — Telehealth (INDEPENDENT_AMBULATORY_CARE_PROVIDER_SITE_OTHER): Payer: Medicare PPO | Admitting: Family Medicine

## 2020-08-19 ENCOUNTER — Other Ambulatory Visit: Payer: Self-pay

## 2020-08-19 DIAGNOSIS — Z6837 Body mass index (BMI) 37.0-37.9, adult: Secondary | ICD-10-CM

## 2020-08-19 DIAGNOSIS — E8881 Metabolic syndrome: Secondary | ICD-10-CM

## 2020-08-19 DIAGNOSIS — E785 Hyperlipidemia, unspecified: Secondary | ICD-10-CM

## 2020-08-19 DIAGNOSIS — E1169 Type 2 diabetes mellitus with other specified complication: Secondary | ICD-10-CM

## 2020-08-19 DIAGNOSIS — Z794 Long term (current) use of insulin: Secondary | ICD-10-CM

## 2020-08-20 NOTE — Progress Notes (Signed)
TeleHealth Visit:  Due to the COVID-19 pandemic, this visit was completed with telemedicine (audio/video) technology to reduce patient and provider exposure as well as to preserve personal protective equipment.   Pamela Love has verbally consented to this TeleHealth visit. The patient is located at home, the provider is located at the [facility] office. The participants in this visit include the listed provider and patient and. The visit was conducted today via MyChart.  OBESITY Pamela Love is here to discuss her progress with her obesity treatment plan along with follow-up of her obesity related diagnoses.   Today's visit was #: 11 Starting weight: 227 lbs Starting date: 03/17/2020 Today's weight: 215 lbs Today's date: 08/19/2020 Total lbs lost to date: 12 lbs Total weight loss percentage to date: -5.29%  Interim History: Pamela Love says she weighed 215 pounds this morning.  She did not check her blood sugars over Christmas.  1/2 - now:  High 156 (3rd), average 130s, 110s.  Nutrition Plan: the Category 2 Plan for 70% of the time.  Anti-obesity medications: Victoza 1.8 mg subcutaneously daily. Reported side effects: None. Activity: [facility] for 2-3 hours per week.  Assessment/Plan:   1. Type 2 diabetes mellitus with other specified complication, with long-term current use of insulin (HCC) Diabetes Mellitus: Not at goal. Medication: Victoza and Toujeo. Issues reviewed with her: blood sugar goals, complications of diabetes mellitus, hypoglycemia prevention and treatment, exercise, and nutrition.  Lab Results  Component Value Date   HGBA1C 6.5 (A) 05/27/2020   HGBA1C 7.9 (H) 04/21/2020   HGBA1C 8.0 (A) 02/20/2020   Lab Results  Component Value Date   LDLCALC 71 04/21/2020   CREATININE 1.17 (H) 05/12/2020   2. Hyperlipidemia with Type 2 diabetes mellitus (HCC) Course: Not at goal. Lipid-lowering medications: Crestor 40 mg daily and Omega-3.   Plan: Dietary changes:  Increase soluble fiber. Decrease simple carbohydrates. Exercise changes: An average 40 minutes of moderate to vigorous-intensity aerobic activity 3 or 4 times per week.   Lab Results  Component Value Date   CHOL 140 04/21/2020   HDL 38 (L) 04/21/2020   LDLCALC 71 04/21/2020   TRIG 182 (H) 04/21/2020   CHOLHDL 3.7 04/21/2020   Lab Results  Component Value Date   ALT 25 04/21/2020   AST 25 04/21/2020   ALKPHOS 104 04/21/2020   BILITOT 0.5 04/21/2020   3. Metabolic syndrome Starting goal: Lose 7-10% of starting weight. She will continue to focus on protein-rich, low simple carbohydrate foods. We reviewed the importance of hydration, regular exercise for stress reduction, and restorative sleep.  We will continue to check lab work every 3 months, with 10% weight loss, or should any other concerns arise.  4. Class 2 severe obesity with serious comorbidity and body mass index (BMI) of 37.0 to 37.9 in adult, unspecified obesity type [facility])  Course: Magaby is currently in the action stage of change. As such, her goal is to continue with weight loss efforts.   Nutrition goals: She has agreed to the Category 2 Plan.   Exercise goals: For substantial health benefits, adults should do at least 150 minutes (2 hours and 30 minutes) a week of moderate-intensity, or 75 minutes (1 hour and 15 minutes) a week of vigorous-intensity aerobic physical activity, or an equivalent combination of moderate- and vigorous-intensity aerobic activity. Aerobic activity should be performed in episodes of at least 10 minutes, and preferably, it should be spread throughout the week.  Behavioral modification strategies: increasing water intake.  [name] has agreed to follow-up with our clinic in 2 weeks. She was informed of the importance of frequent follow-up visits to maximize her success with intensive lifestyle modifications for her multiple health conditions.   Objective:   General: Cooperative, alert, well developed,  in no acute distress. HEENT: Conjunctivae and lids unremarkable. Cardiovascular: Regular rhythm.  Lungs: Normal work of breathing. Neurologic: No focal deficits.   Lab Results  Component Value Date   CREATININE 1.17 (H) 05/12/2020   BUN 43 (H) 05/12/2020   NA 140 05/12/2020   K 3.6 05/12/2020   CL 100 05/12/2020   CO2 24 05/12/2020   Lab Results  Component Value Date   ALT 25 04/21/2020   AST 25 04/21/2020   ALKPHOS 104 04/21/2020   BILITOT 0.5 04/21/2020   Lab Results  Component Value Date   HGBA1C 6.5 (A) 05/27/2020   HGBA1C 7.9 (H) 04/21/2020   HGBA1C 8.0 (A) 02/20/2020   HGBA1C 7.7 (A) 10/07/2019   HGBA1C 7.3 (A) 05/14/2019   Lab Results  Component Value Date   TSH 1.71 05/30/2018   Lab Results  Component Value Date   CHOL 140 04/21/2020   HDL 38 (L) 04/21/2020   LDLCALC 71 04/21/2020   TRIG 182 (H) 04/21/2020   CHOLHDL 3.7 04/21/2020   Lab Results  Component Value Date   WBC 7.1 01/07/2020   HGB 13.2 01/07/2020   HCT 39.1 01/07/2020   MCV 90.3 01/07/2020   PLT 225 01/07/2020   Attestation Statements:   Reviewed by clinician on day of visit: allergies, medications, problem list, medical history, surgical history, family history, social history, and previous encounter notes. Time, both pre and post visit:  >30 minutes.  I, Insurance claims handler, CMA, am acting as transcriptionist for Helane Rima, DO  I have reviewed the above documentation for accuracy and completeness, and I agree with the above. Helane Rima, DO

## 2020-08-31 ENCOUNTER — Ambulatory Visit (INDEPENDENT_AMBULATORY_CARE_PROVIDER_SITE_OTHER): Payer: Medicare PPO | Admitting: Physician Assistant

## 2020-08-31 ENCOUNTER — Encounter (INDEPENDENT_AMBULATORY_CARE_PROVIDER_SITE_OTHER): Payer: Self-pay | Admitting: Physician Assistant

## 2020-08-31 ENCOUNTER — Other Ambulatory Visit: Payer: Self-pay

## 2020-08-31 VITALS — BP 111/71 | HR 81 | Temp 97.7°F | Ht 63.0 in | Wt 211.0 lb

## 2020-08-31 DIAGNOSIS — Z6837 Body mass index (BMI) 37.0-37.9, adult: Secondary | ICD-10-CM

## 2020-08-31 DIAGNOSIS — Z794 Long term (current) use of insulin: Secondary | ICD-10-CM

## 2020-08-31 DIAGNOSIS — E1169 Type 2 diabetes mellitus with other specified complication: Secondary | ICD-10-CM

## 2020-08-31 DIAGNOSIS — E559 Vitamin D deficiency, unspecified: Secondary | ICD-10-CM | POA: Diagnosis not present

## 2020-08-31 NOTE — Progress Notes (Signed)
Chief Complaint:   OBESITY Shiree is here to discuss her progress with her obesity treatment plan along with follow-up of her obesity related diagnoses. Azizi is on the Category 2 Plan and states she is following her eating plan approximately 75% of the time. Mercedes states she is doing cardio and weight training for 60-120 minutes 2 times per week.  Today's visit was #: 12 Starting weight: 227 lbs Starting date: 03/17/2020 Today's weight: 211 lbs Today's date: 08/31/2020 Total lbs lost to date: 16 Total lbs lost since last in-office visit: 2  Interim History: Lorra reports that she is tired of lunch and she is looking for other options. Her hunger is satisfied most days. She is not drinking enough water.  Subjective:   1. Type 2 diabetes mellitus with other specified complication, with long-term current use of insulin (HCC) [place] is on Toujeo, insulin, and Victoza 1.8 mg. Her fasting BGs range between 97 and 154. Last A1c was 6.5. She sees Dr. Everardo All in 2 days. She denies hypoglycemia.  2. Vitamin D deficiency Denishia is on 2,000 units of Vit D daily. Last Vit D level was at goal.   Assessment/Plan:   1. Type 2 diabetes mellitus with other specified complication, with long-term current use of insulin (HCC) Good blood sugar control is important to decrease the likelihood of diabetic complications such as nephropathy, neuropathy, limb loss, blindness, coronary artery disease, and death. Intensive lifestyle modification including diet, exercise and weight loss are the first line of treatment for diabetes. [name] will follow up with Dr. Everardo All and will continue with blood sugar monitoring and medications.  2. Vitamin D deficiency Low Vitamin D level contributes to fatigue and are associated with obesity, breast, and colon cancer. Angeliah agreed to continue taking Vitamin D 2,000 IU daily and will follow-up for routine testing of Vitamin D, at least 2-3 times per year to avoid  over-replacement.  3. Class 2 severe obesity with serious comorbidity and body mass index (BMI) of 37.0 to 37.9 in adult, unspecified obesity type (HCC) Taziah is currently in the action stage of change. As such, her goal is to continue with weight loss efforts. She has agreed to the Category 2 Plan.   Lunch options were given today.  Exercise goals: As is.  Behavioral modification strategies: increasing water intake and meal planning and cooking strategies.  Kanita has agreed to follow-up with our clinic in 2 weeks. She was informed of the importance of frequent follow-up visits to maximize her success with intensive lifestyle modifications for her multiple health conditions.   Objective:   Blood pressure 111/71, pulse 81, temperature 97.7 F (36.5 C), height 5\' 3"  (1.6 m), weight 211 lb (95.7 kg), SpO2 99 %. Body mass index is 37.38 kg/m.  General: Cooperative, alert, well developed, in no acute distress. HEENT: Conjunctivae and lids unremarkable. Cardiovascular: Regular rhythm.  Lungs: Normal work of breathing. Neurologic: No focal deficits.   Lab Results  Component Value Date   CREATININE 1.17 (H) 05/12/2020   BUN 43 (H) 05/12/2020   NA 140 05/12/2020   K 3.6 05/12/2020   CL 100 05/12/2020   CO2 24 05/12/2020   Lab Results  Component Value Date   ALT 25 04/21/2020   AST 25 04/21/2020   ALKPHOS 104 04/21/2020   BILITOT 0.5 04/21/2020   Lab Results  Component Value Date   HGBA1C 6.5 (A) 05/27/2020   HGBA1C 7.9 (H) 04/21/2020   HGBA1C 8.0 (A) 02/20/2020   HGBA1C  7.7 (A) 10/07/2019   HGBA1C 7.3 (A) 05/14/2019   No results found for: INSULIN Lab Results  Component Value Date   TSH 1.71 05/30/2018   Lab Results  Component Value Date   CHOL 140 04/21/2020   HDL 38 (L) 04/21/2020   LDLCALC 71 04/21/2020   TRIG 182 (H) 04/21/2020   CHOLHDL 3.7 04/21/2020   Lab Results  Component Value Date   WBC 7.1 01/07/2020   HGB 13.2 01/07/2020   HCT 39.1 01/07/2020    MCV 90.3 01/07/2020   PLT 225 01/07/2020   No results found for: IRON, TIBC, FERRITIN  Obesity Behavioral Intervention:   Approximately 15 minutes were spent on the discussion below.  ASK: We discussed the diagnosis of obesity with Laurisa today and Jenean agreed to give [place] permission to discuss obesity behavioral modification therapy today.  ASSESS: Oanh has the diagnosis of obesity and her BMI today is 37.39. Nakeda is in the action stage of change.   ADVISE: Rosaria was educated on the multiple health risks of obesity as well as the benefit of weight loss to improve her health. She was advised of the need for long term treatment and the importance of lifestyle modifications to improve her current health and to decrease her risk of future health problems.  AGREE: Multiple dietary modification options and treatment options were discussed and Eunique agreed to follow the recommendations documented in the above note.  ARRANGE: Joylynn was educated on the importance of frequent visits to treat obesity as outlined per CMS and USPSTF guidelines and agreed to schedule her next follow up appointment today.  Attestation Statements:   Reviewed by clinician on day of visit: allergies, medications, problem list, medical history, surgical history, family history, social history, and previous encounter notes.   Trude Mcburney, am acting as transcriptionist for [employer], PA-C.  I have reviewed the above documentation for accuracy and completeness, and I agree with the above. Alois Cliche, PA-C

## 2020-09-02 ENCOUNTER — Encounter: Payer: Self-pay | Admitting: Endocrinology

## 2020-09-02 ENCOUNTER — Ambulatory Visit: Payer: Medicare PPO | Admitting: Endocrinology

## 2020-09-02 ENCOUNTER — Other Ambulatory Visit: Payer: Self-pay

## 2020-09-02 VITALS — BP 110/72 | HR 79 | Ht 63.0 in | Wt 213.0 lb

## 2020-09-02 DIAGNOSIS — E1122 Type 2 diabetes mellitus with diabetic chronic kidney disease: Secondary | ICD-10-CM

## 2020-09-02 DIAGNOSIS — N1831 Chronic kidney disease, stage 3a: Secondary | ICD-10-CM | POA: Diagnosis not present

## 2020-09-02 LAB — POCT GLYCOSYLATED HEMOGLOBIN (HGB A1C): Hemoglobin A1C: 6.3 % — AB (ref 4.0–5.6)

## 2020-09-02 MED ORDER — TOUJEO MAX SOLOSTAR 300 UNIT/ML ~~LOC~~ SOPN
50.0000 [IU] | PEN_INJECTOR | SUBCUTANEOUS | 3 refills | Status: DC
Start: 2020-09-02 — End: 2020-12-10

## 2020-09-02 NOTE — Patient Instructions (Addendum)
Please reduce the Toujeo to 50 units each morning.   check your blood sugar twice a day.  vary the time of day when you check, between before the 3 meals, and at bedtime.  also check if you have symptoms of your blood sugar being too high or too low.  please keep a record of the readings and bring it to your next appointment here (or you can bring the meter itself).  You can write it on any piece of paper.  please call us sooner if your blood sugar goes below 70, or if you have a lot of readings over 200.   Please come back for a follow-up appointment in 3 months.

## 2020-09-02 NOTE — Progress Notes (Signed)
Subjective:    Patient ID: Pamela Love, female    DOB: Dec 14, 1950, 70 y.o.   MRN: [id number]  HPI Pt returns for f/u of diabetes mellitus: DM type: Insulin-requiring type 2 Dx'ed: 4917 Complications: CRI and CAD Therapy: insulin since 2019, and Victoza GDM: never.  DKA: never.  Severe hypoglycemia: never.  Pancreatitis: never.  Other: she also took insulin for 6 months, in 2016; CRI, vaginitis, and edema limit rx options; she did not tolerate trulicity (nausea), or repaglinide (diarrhea); she declines multiple daily injections.: HWW rx'ed Victoza.   Interval history: she brings a record of her cbg's which I have reviewed today.  cbg varies from 87-154.  She has reduced insulin to 60 units qam.  pt states she feels well in general.  Past Medical History:  Diagnosis Date  . Abnormal nuclear stress test 01/02/2020  . Arthritis   . Bilateral leg edema 01/02/2020  . Bursitis    hips  . C. difficile colitis 12/2017  . CAD (coronary artery disease) 01/06/2020  . CKD (chronic kidney disease), stage III ([place])   . Complication of anesthesia    "spinal did not work with second c section"  . Diabetes mellitus ([place]) 11/19/2015  . Diabetes mellitus, type 2 ([place])    diet controlled - has Rx for Glymipride but has not started taking yet  . Dyslipidemia 10/21/2014  . GERD (gastroesophageal reflux disease)   . History of nonmelanoma skin cancer   . History of skin cancer   . Hypercholesterolemia 12/11/2019  . Hyperlipidemia   . Hypertension   . Hypertensive disorder 10/21/2014  . Lower extremity edema   . Mixed hyperlipidemia 01/02/2020  . OAB (overactive bladder)   . Obesity (BMI 30-39.9) 05/12/2020  . OSA (obstructive sleep apnea) 05/12/2020  . Osteoarthritis of right knee 05/15/2015  . Osteoarthrosis, unspecified whether generalized or localized, involving lower leg 10/21/2014  . Osteoporosis   . Over weight   . Primary osteoarthritis of left knee 12/18/2014  . S/P knee replacement  12/18/2014  . S/P total knee replacement using cement 05/15/2015  . Sleep apnea   . Tachycardia 12/11/2019    Past Surgical History:  Procedure Laterality Date  . ABDOMINAL HYSTERECTOMY  2005  . CATARACT EXTRACTION    . CESAREAN SECTION     x2  . colonscopy     . CORONARY STENT INTERVENTION N/A 01/06/2020   Procedure: CORONARY STENT INTERVENTION;  Surgeon: Lorretta Harp, MD;  Location: [facility];  Service: Cardiovascular;  Laterality: N/A;  . EYE SURGERY     bilat cataract surgery 2015  . INTRAVASCULAR PRESSURE WIRE/FFR STUDY N/A 01/06/2020   Procedure: INTRAVASCULAR PRESSURE WIRE/FFR STUDY;  Surgeon: Lorretta Harp, MD;  Location: [facility];  Service: Cardiovascular;  Laterality: N/A;  . LEFT HEART CATH AND CORONARY ANGIOGRAPHY N/A 01/06/2020   Procedure: LEFT HEART CATH AND CORONARY ANGIOGRAPHY;  Surgeon: Lorretta Harp, MD;  Location: [facility];  Service: Cardiovascular;  Laterality: N/A;  . TOTAL KNEE ARTHROPLASTY Left 12/18/2014   Procedure: TOTAL LEFT KNEE ARTHROPLASTY;  Surgeon: Sydnee Cabal, MD;  Location: [facility];  Service: Orthopedics;  Laterality: Left;  . TOTAL KNEE ARTHROPLASTY Right 05/15/2015   Procedure: RIGHT TOTAL KNEE ARTHROPLASTY;  Surgeon: Sydnee Cabal, MD;  Location: [facility];  Service: Orthopedics;  Laterality: Right;  . UTERINE FIBROID SURGERY      Social History   Socioeconomic History  . Marital status: Married    Spouse name:  Not on file  . Number of children: Not on file  . Years of education: Not on file  . Highest education level: Not on file  Occupational History  . Occupation: retired  Tobacco Use  . Smoking status: Never Smoker  . Smokeless tobacco: Never Used  Vaping Use  . Vaping Use: Never used  Substance and Sexual Activity  . Alcohol use: Yes    Comment: rare  . Drug use: No  . Sexual activity: Not on file  Other Topics Concern  . Not on file  Social History Narrative  . Not on file   Social  Determinants of Health   Financial Resource Strain: Not on file  Food Insecurity: Not on file  Transportation Needs: Not on file  Physical Activity: Not on file  Stress: Not on file  Social Connections: Not on file  Intimate Partner Violence: Not on file    Current Outpatient Medications on File Prior to Visit  Medication Sig Dispense Refill  . Accu-Chek Softclix Lancets lancets 1 each by Other route in the morning and at bedtime. E11.9 180 each 0  . acetaminophen (TYLENOL) 650 MG CR tablet Take 1,300 mg by mouth at bedtime.    Marland Kitchen aspirin EC 81 MG tablet Take 1 tablet (81 mg total) by mouth daily. 90 tablet 3  . Blood Glucose Monitoring Suppl (ACCU-CHEK GUIDE ME) w/Device KIT 1 each by Does not apply route 2 (two) times daily. E11.9 1 kit 0  . carvedilol (COREG) 3.125 MG tablet Take 1 tablet (3.125 mg total) by mouth 2 (two) times daily with a meal. 60 tablet 11  . Cholecalciferol (VITAMIN D) 2000 UNITS CAPS Take 2,000 Units by mouth every morning.     . clopidogrel (PLAVIX) 75 MG tablet Take 1 tablet (75 mg total) by mouth daily with breakfast. 30 tablet 11  . Coenzyme Q10 (COQ10) 200 MG CAPS Take 200 mg by mouth daily.     . diclofenac sodium (VOLTAREN) 1 % GEL Apply 1 application topically at bedtime as needed (knee pain.).     Marland Kitchen DULoxetine (CYMBALTA) 20 MG capsule Take 40 mg by mouth at bedtime.     Marland Kitchen glucose blood (ACCU-CHEK GUIDE) test strip 1 each by Other route 2 (two) times daily. E11.9 200 each 0  . Insulin Pen Needle (BD PEN NEEDLE NANO U/F) 32G X 4 MM MISC 1 each by Does not apply route daily. 100 each 0  . liraglutide (VICTOZA) 18 MG/3ML SOPN Inject 1.8 mg into the skin once a week. 3.6 mL 0  . Multiple Vitamin (MULTIVITAMIN WITH MINERALS) TABS tablet Take 1 tablet by mouth every morning.     . Omega-3 Fatty Acids (RA FISH OIL) 1400 MG CPDR Take 1,400 mg by mouth 2 (two) times daily.    . potassium chloride SA (KLOR-CON) 20 MEQ tablet Take 1 tablet (20 mEq total) by mouth 3  (three) times a week. 36 tablet 3  . ranolazine (RANEXA) 500 MG 12 hr tablet TAKE 1 TABLET BY MOUTH TWICE A DAY 180 tablet 2  . rosuvastatin (CRESTOR) 40 MG tablet Take 40 mg by mouth daily.     Marland Kitchen saccharomyces boulardii (FLORASTOR) 250 MG capsule Take 250 mg by mouth daily.    . trospium (SANCTURA) 20 MG tablet Take 20 mg by mouth 2 (two) times daily.  3  . valsartan-hydrochlorothiazide (DIOVAN-HCT) 160-12.5 MG tablet Take 0.5 tablets by mouth daily.    . vitamin C (ASCORBIC ACID) 250 MG tablet Take  250 mg by mouth daily.    . furosemide (LASIX) 40 MG tablet Take 1 tablet (40 mg total) by mouth 3 (three) times a week. 36 tablet 3  . nitroGLYCERIN (NITROSTAT) 0.4 MG SL tablet Place 1 tablet (0.4 mg total) under the tongue every 5 (five) minutes as needed for chest pain. 90 tablet 3   No current facility-administered medications on file prior to visit.    Allergies  Allergen Reactions  . Colchicine     Stomach cramps  . Diclofenac     Oral causes stomach cramps  . Janumet [Sitagliptin-Metformin Hcl] Nausea And Vomiting    Lightheaded   . Septra [Sulfamethoxazole-Trimethoprim] Itching  . Trulicity [Dulaglutide] Diarrhea and Nausea And Vomiting  . Zetia [Ezetimibe]     Muscle pain  . Imipramine Rash  . Tape Rash    Paper tape ok to use     Family History  Problem Relation Age of Onset  . Diabetes Maternal Grandfather   . Heart attack Maternal Grandfather   . Heart disease Mother   . Hypertension Mother   . Hyperlipidemia Mother   . Cancer Mother   . Heart attack Father   . Hypertension Father   . Hyperlipidemia Father   . Heart disease Father   . Sudden death Father   . Sleep apnea Father   . Heart attack Sister   . Heart attack Paternal Uncle   . Congenital heart disease Son     BP 110/72   Pulse 79   Ht '5\' 3"'  (1.6 m)   Wt 213 lb (96.6 kg)   SpO2 99%   BMI 37.73 kg/m    Review of Systems She denies hypoglycemia.      Objective:   Physical Exam VITAL SIGNS:   See vs page GENERAL: no distress Pulses: dorsalis pedis intact bilat.   MSK: no deformity of the feet CV: no leg edema Skin:  no ulcer on the feet.  normal color and temp on the feet. Neuro: sensation is intact to touch on the feet Ext: there is bilateral onychomycosis of the toenails.    Lab Results  Component Value Date   HGBA1C 6.3 (A) 09/02/2020       Assessment & Plan:  Insulin-requiring type 2 DM, with CAD: overcontrolled.   Patient Instructions  Please reduce the Toujeo to 50 units each morning.   check your blood sugar twice a day.  vary the time of day when you check, between before the 3 meals, and at bedtime.  also check if you have symptoms of your blood sugar being too high or too low.  please keep a record of the readings and bring it to your next appointment here (or you can bring the meter itself).  You can write it on any piece of paper.  please call us sooner if your blood sugar goes below 70, or if you have a lot of readings over 200.   Please come back for a follow-up appointment in 3 months.

## 2020-09-04 ENCOUNTER — Other Ambulatory Visit: Payer: Self-pay | Admitting: Obstetrics and Gynecology

## 2020-09-04 DIAGNOSIS — Z1231 Encounter for screening mammogram for malignant neoplasm of breast: Secondary | ICD-10-CM

## 2020-09-14 ENCOUNTER — Other Ambulatory Visit: Payer: Self-pay

## 2020-09-14 ENCOUNTER — Ambulatory Visit (INDEPENDENT_AMBULATORY_CARE_PROVIDER_SITE_OTHER): Payer: Medicare PPO | Admitting: Family Medicine

## 2020-09-14 ENCOUNTER — Encounter (INDEPENDENT_AMBULATORY_CARE_PROVIDER_SITE_OTHER): Payer: Self-pay | Admitting: Family Medicine

## 2020-09-14 VITALS — BP 117/74 | HR 79 | Temp 97.8°F | Ht 63.0 in | Wt 209.0 lb

## 2020-09-14 DIAGNOSIS — I152 Hypertension secondary to endocrine disorders: Secondary | ICD-10-CM | POA: Diagnosis not present

## 2020-09-14 DIAGNOSIS — E1169 Type 2 diabetes mellitus with other specified complication: Secondary | ICD-10-CM

## 2020-09-14 DIAGNOSIS — E1159 Type 2 diabetes mellitus with other circulatory complications: Secondary | ICD-10-CM

## 2020-09-14 DIAGNOSIS — E785 Hyperlipidemia, unspecified: Secondary | ICD-10-CM

## 2020-09-14 DIAGNOSIS — Z6837 Body mass index (BMI) 37.0-37.9, adult: Secondary | ICD-10-CM | POA: Diagnosis not present

## 2020-09-14 DIAGNOSIS — E66812 Obesity, class 2: Secondary | ICD-10-CM

## 2020-09-14 DIAGNOSIS — Z794 Long term (current) use of insulin: Secondary | ICD-10-CM

## 2020-09-14 MED ORDER — VICTOZA 18 MG/3ML ~~LOC~~ SOPN: 1.8000 mg | PEN_INJECTOR | SUBCUTANEOUS | 0 refills | Status: DC

## 2020-09-17 NOTE — Progress Notes (Signed)
Chief Complaint:   OBESITY Pamela Love is here to discuss her progress with her obesity treatment plan along with follow-up of her obesity related diagnoses.   Today's visit was #: 13 Starting weight: 227 lbs Starting date: 03/17/2020 Today's weight: 209 lbs Today's date: 09/14/2020 Total lbs lost to date: 18 lbs Body mass index is 37.02 kg/m.  Total weight loss percentage to date: -7.93%  Interim History: [name] says her Toujeo was decreased to 50 mg daily by Endocrinology last week.  She is monitoring her blood glucose, and they are running 78-140.  Nutrition Plan: Category 2 Plan for 70% of the time. Anti-obesity medications: Victoza. Reported side effects: None. Activity: Dancing for 120 minutes.  Assessment/Plan:   1. Type 2 diabetes mellitus with other specified complication, with long-term current use of insulin (HCC) Diabetes Mellitus: Not at goal. Medication: Victoza and Toujeo. Issues reviewed: blood sugar goals, complications of diabetes mellitus, hypoglycemia prevention and treatment, exercise, and nutrition.   Plan:  Continue Victoza, Toujeo.  The patient was encouraged to monitor blood sugars regularly and to bring their log to the next appointment for review.  The importance of regular follow up with PCP and all other specialists as scheduled was stressed to patient today.  Lab Results  Component Value Date   HGBA1C 6.3 (A) 09/02/2020   HGBA1C 6.5 (A) 05/27/2020   HGBA1C 7.9 (H) 04/21/2020   Lab Results  Component Value Date   LDLCALC 71 04/21/2020   CREATININE 1.17 (H) 05/12/2020   - Refill liraglutide (VICTOZA) 18 MG/3ML SOPN; Inject 1.8 mg into the skin once a week.  Dispense: 3.6 mL; Refill: 0  2. Hypertension associated with type 2 diabetes mellitus (HCC) At goal. Medications: Coreg 3.125 mg daily, valsartan-HCTZ.   Plan: Avoid buying foods that are: processed, frozen, or prepackaged to avoid excess salt. We will watch for signs of hypotension as she  continues lifestyle modifications. We will continue to monitor closely alongside her PCP and/or Specialist.  Regular follow up with PCP and specialists was also encouraged.   BP Readings from Last 3 Encounters:  09/14/20 117/74  09/02/20 110/72  08/31/20 111/71   Lab Results  Component Value Date   CREATININE 1.17 (H) 05/12/2020   3. Hyperlipidemia with Type 2 diabetes mellitus (HCC) Course: Not at goal. Lipid-lowering medications: Crestor 40 mg daily.   Plan: Dietary changes: Increase soluble fiber, decrease simple carbohydrates, decrease saturated fat. Exercise changes: Moderate to vigorous-intensity aerobic activity 150 minutes per week or as tolerated. We will continue to monitor along with PCP/specialists as it pertains to her weight loss journey.  Lab Results  Component Value Date   CHOL 140 04/21/2020   HDL 38 (L) 04/21/2020   LDLCALC 71 04/21/2020   TRIG 182 (H) 04/21/2020   CHOLHDL 3.7 04/21/2020   Lab Results  Component Value Date   ALT 25 04/21/2020   AST 25 04/21/2020   ALKPHOS 104 04/21/2020   BILITOT 0.5 04/21/2020   4. Class 2 severe obesity with serious comorbidity and body mass index (BMI) of 37.0 to 37.9 in adult, unspecified obesity type [facility])  Course: [name] is currently in the action stage of change. As such, her goal is to continue with weight loss efforts.   Nutrition goals: She has agreed to following a lower carbohydrate, vegetable and lean protein rich diet plan, watching carbohydrates and protein.   Exercise goals: As is.  Behavioral modification strategies: increasing lean protein intake, decreasing simple carbohydrates, increasing vegetables and increasing  water intake.  [name] has agreed to follow-up with our clinic in 3 weeks. She was informed of the importance of frequent follow-up visits to maximize her success with intensive lifestyle modifications for her multiple health conditions.   Objective:   Blood pressure 117/74, pulse 79,  temperature 97.8 F (36.6 C), temperature source Oral, height 5\' 3"  (1.6 m), weight 209 lb (94.8 kg), SpO2 98 %. Body mass index is 37.02 kg/m.  General: Cooperative, alert, well developed, in no acute distress. HEENT: Conjunctivae and lids unremarkable. Cardiovascular: Regular rhythm.  Lungs: Normal work of breathing. Neurologic: No focal deficits.   Lab Results  Component Value Date   CREATININE 1.17 (H) 05/12/2020   BUN 43 (H) 05/12/2020   NA 140 05/12/2020   K 3.6 05/12/2020   CL 100 05/12/2020   CO2 24 05/12/2020   Lab Results  Component Value Date   ALT 25 04/21/2020   AST 25 04/21/2020   ALKPHOS 104 04/21/2020   BILITOT 0.5 04/21/2020   Lab Results  Component Value Date   HGBA1C 6.3 (A) 09/02/2020   HGBA1C 6.5 (A) 05/27/2020   HGBA1C 7.9 (H) 04/21/2020   HGBA1C 8.0 (A) 02/20/2020   HGBA1C 7.7 (A) 10/07/2019   Lab Results  Component Value Date   TSH 1.71 05/30/2018   Lab Results  Component Value Date   CHOL 140 04/21/2020   HDL 38 (L) 04/21/2020   LDLCALC 71 04/21/2020   TRIG 182 (H) 04/21/2020   CHOLHDL 3.7 04/21/2020   Lab Results  Component Value Date   WBC 7.1 01/07/2020   HGB 13.2 01/07/2020   HCT 39.1 01/07/2020   MCV 90.3 01/07/2020   PLT 225 01/07/2020   Obesity Behavioral Intervention:   Approximately 15 minutes were spent on the discussion below.  ASK: We discussed the diagnosis of obesity with [name] today and [name] agreed to give Stark Bray permission to discuss obesity behavioral modification therapy today.  ASSESS: [name] has the diagnosis of obesity and her BMI today is 37.1. [name] is in the action stage of change.   ADVISE: [name] was educated on the multiple health risks of obesity as well as the benefit of weight loss to improve her health. She was advised of the need for long term treatment and the importance of lifestyle modifications to improve her current health and to decrease her risk of future health problems.  AGREE: Multiple  dietary modification options and treatment options were discussed and [name] agreed to follow the recommendations documented in the above note.  ARRANGE: [name] was educated on the importance of frequent visits to treat obesity as outlined per CMS and USPSTF guidelines and agreed to schedule her next follow up appointment today.  Attestation Statements:   Reviewed by clinician on day of visit: allergies, medications, problem list, medical history, surgical history, family history, social history, and previous encounter notes.  I, Stark Bray, CMA, am acting as transcriptionist for Insurance claims handler, DO  I have reviewed the above documentation for accuracy and completeness, and I agree with the above. Helane Rima, DO

## 2020-09-18 DIAGNOSIS — G4733 Obstructive sleep apnea (adult) (pediatric): Secondary | ICD-10-CM | POA: Diagnosis not present

## 2020-09-28 ENCOUNTER — Encounter: Payer: Self-pay | Admitting: Cardiovascular Disease

## 2020-09-28 ENCOUNTER — Ambulatory Visit: Payer: Medicare PPO | Admitting: Cardiovascular Disease

## 2020-09-28 ENCOUNTER — Other Ambulatory Visit: Payer: Self-pay

## 2020-09-28 ENCOUNTER — Telehealth: Payer: Self-pay | Admitting: *Deleted

## 2020-09-28 VITALS — BP 110/66 | HR 79 | Ht 63.0 in | Wt 214.4 lb

## 2020-09-28 DIAGNOSIS — E785 Hyperlipidemia, unspecified: Secondary | ICD-10-CM

## 2020-09-28 DIAGNOSIS — E118 Type 2 diabetes mellitus with unspecified complications: Secondary | ICD-10-CM

## 2020-09-28 DIAGNOSIS — Z794 Long term (current) use of insulin: Secondary | ICD-10-CM | POA: Diagnosis not present

## 2020-09-28 DIAGNOSIS — G4733 Obstructive sleep apnea (adult) (pediatric): Secondary | ICD-10-CM | POA: Diagnosis not present

## 2020-09-28 DIAGNOSIS — E669 Obesity, unspecified: Secondary | ICD-10-CM | POA: Diagnosis not present

## 2020-09-28 DIAGNOSIS — I2510 Atherosclerotic heart disease of native coronary artery without angina pectoris: Secondary | ICD-10-CM

## 2020-09-28 DIAGNOSIS — I10 Essential (primary) hypertension: Secondary | ICD-10-CM

## 2020-09-28 NOTE — Progress Notes (Signed)
Cardiology Office Note    Date:  10/04/2020   ID:  Pamela Love, Pamela Love 09-06-1950, MRN [id number]  PCP:  Mayra Neer, MD  Cardiologist:  Shelva Majestic, MD (sleep); Kardie Tobb,DO  No chief complaint on file.   History of Present Illness:  Pamela Love is a 70 y.o. female who has a history of coronary artery disease and is status post PCI to her RCA in June 2021 as well as a history of hypertension, type 2 diabetes mellitus, and hyperlipidemia.  She is followed by Dr. Berniece Salines for her cardiology care.  Due to concerns for obstructive sleep apnea she was referred for a diagnostic polysomnogram which was done on January 31, 2020.  She was found to have moderate overall sleep apnea with an AHI of 18.2/h.  She was unable to achieve any REM sleep.  She had oxygen desaturation to a nadir of 84% and there was evidence for moderate snoring during the evaluation.  She subsequently underwent a CPAP titration study on April 24, 2020.  CPAP was initiated at 5 cm and was titrated to 14 cm of water with an AHI of 5.6 and O2 nadir at 85%.  Again, she did not achieve any REM sleep throughout the study.  An optimal PAP pressure could not be selectively she ultimately was recommended to initiate therapy with CPAP auto at a range of 11-20 with EPR of 3.  Prior to initiating therapy she had complaints of snoring, nonrestorative sleep, and occasional nocturia.  Her CPAP set up date was June 18, 2020.  Since being on therapy, she is unaware of any breakthrough snoring her sleep is more restful and she feels more alert.  An Epworth Sleepiness Scale score was recalculated in the office today and this endorsed at 7 arguing against daytime sleepiness.  I was able to obtain a download from from January 27 through September 25, 2020.  Compliance is excellent with 100% use and average use at 7 hours per night.  Her CPAP is set at a pressure range of 8 to 18 cm of water.  AHI is excellent at 1.4/h.  Her  95th percentile pressure is 15 with a maximum average pressure at 16.7 cm of water.  She presents for initial evaluation.   Past Medical History:  Diagnosis Date  . Abnormal nuclear stress test 01/02/2020  . Arthritis   . Bilateral leg edema 01/02/2020  . Bursitis    hips  . C. difficile colitis 12/2017  . CAD (coronary artery disease) 01/06/2020  . CKD (chronic kidney disease), stage III ([place])   . Complication of anesthesia    "spinal did not work with second c section"  . Diabetes mellitus ([place]) 11/19/2015  . Diabetes mellitus, type 2 ([place])    diet controlled - has Rx for Glymipride but has not started taking yet  . Dyslipidemia 10/21/2014  . GERD (gastroesophageal reflux disease)   . History of nonmelanoma skin cancer   . History of skin cancer   . Hypercholesterolemia 12/11/2019  . Hyperlipidemia   . Hypertension   . Hypertensive disorder 10/21/2014  . Lower extremity edema   . Mixed hyperlipidemia 01/02/2020  . OAB (overactive bladder)   . Obesity (BMI 30-39.9) 05/12/2020  . OSA (obstructive sleep apnea) 05/12/2020  . Osteoarthritis of right knee 05/15/2015  . Osteoarthrosis, unspecified whether generalized or localized, involving lower leg 10/21/2014  . Osteoporosis   . Over weight   . Primary osteoarthritis of left knee 12/18/2014  .  S/P knee replacement 12/18/2014  . S/P total knee replacement using cement 05/15/2015  . Sleep apnea   . Tachycardia 12/11/2019    Past Surgical History:  Procedure Laterality Date  . ABDOMINAL HYSTERECTOMY  2005  . CATARACT EXTRACTION    . CESAREAN SECTION     x2  . colonscopy     . CORONARY STENT INTERVENTION N/A 01/06/2020   Procedure: CORONARY STENT INTERVENTION;  Surgeon: Lorretta Harp, MD;  Location: [facility];  Service: Cardiovascular;  Laterality: N/A;  . EYE SURGERY     bilat cataract surgery 2015  . INTRAVASCULAR PRESSURE WIRE/FFR STUDY N/A 01/06/2020   Procedure: INTRAVASCULAR PRESSURE WIRE/FFR STUDY;  Surgeon: Lorretta Harp, MD;  Location: [facility];  Service: Cardiovascular;  Laterality: N/A;  . LEFT HEART CATH AND CORONARY ANGIOGRAPHY N/A 01/06/2020   Procedure: LEFT HEART CATH AND CORONARY ANGIOGRAPHY;  Surgeon: Lorretta Harp, MD;  Location: [facility];  Service: Cardiovascular;  Laterality: N/A;  . TOTAL KNEE ARTHROPLASTY Left 12/18/2014   Procedure: TOTAL LEFT KNEE ARTHROPLASTY;  Surgeon: Sydnee Cabal, MD;  Location: [facility];  Service: Orthopedics;  Laterality: Left;  . TOTAL KNEE ARTHROPLASTY Right 05/15/2015   Procedure: RIGHT TOTAL KNEE ARTHROPLASTY;  Surgeon: Sydnee Cabal, MD;  Location: [facility];  Service: Orthopedics;  Laterality: Right;  . UTERINE FIBROID SURGERY      Current Medications: Outpatient Medications Prior to Visit  Medication Sig Dispense Refill  . Accu-Chek Softclix Lancets lancets 1 each by Other route in the morning and at bedtime. E11.9 180 each 0  . acetaminophen (TYLENOL) 650 MG CR tablet Take 1,300 mg by mouth at bedtime.    Marland Kitchen aspirin EC 81 MG tablet Take 1 tablet (81 mg total) by mouth daily. 90 tablet 3  . Blood Glucose Monitoring Suppl (ACCU-CHEK GUIDE ME) w/Device KIT 1 each by Does not apply route 2 (two) times daily. E11.9 1 kit 0  . carvedilol (COREG) 3.125 MG tablet Take 1 tablet (3.125 mg total) by mouth 2 (two) times daily with a meal. 60 tablet 11  . Cholecalciferol (VITAMIN D) 2000 UNITS CAPS Take 2,000 Units by mouth every morning.     . clopidogrel (PLAVIX) 75 MG tablet Take 1 tablet (75 mg total) by mouth daily with breakfast. 30 tablet 11  . Coenzyme Q10 (COQ10) 200 MG CAPS Take 200 mg by mouth daily.     . diclofenac sodium (VOLTAREN) 1 % GEL Apply 1 application topically at bedtime as needed (knee pain.).     Marland Kitchen DULoxetine (CYMBALTA) 20 MG capsule Take 40 mg by mouth at bedtime.     . furosemide (LASIX) 40 MG tablet Take 1 tablet (40 mg total) by mouth 3 (three) times a week. 36 tablet 3  . glucose blood (ACCU-CHEK GUIDE) test strip 1  each by Other route 2 (two) times daily. E11.9 200 each 0  . insulin glargine, 2 Unit Dial, (TOUJEO MAX SOLOSTAR) 300 UNIT/ML Solostar Pen Inject 50 Units into the skin every morning. And 4 mm pen needles 2/day 72 mL 3  . Insulin Pen Needle (BD PEN NEEDLE NANO U/F) 32G X 4 MM MISC 1 each by Does not apply route daily. 100 each 0  . liraglutide (VICTOZA) 18 MG/3ML SOPN Inject 1.8 mg into the skin daily.    . Multiple Vitamin (MULTIVITAMIN WITH MINERALS) TABS tablet Take 1 tablet by mouth every morning.     . nitroGLYCERIN (NITROSTAT) 0.4 MG SL tablet  Place 1 tablet (0.4 mg total) under the tongue every 5 (five) minutes as needed for chest pain. 90 tablet 3  . Omega-3 Fatty Acids (RA FISH OIL) 1400 MG CPDR Take 1,400 mg by mouth 2 (two) times daily.    . potassium chloride SA (KLOR-CON) 20 MEQ tablet Take 1 tablet (20 mEq total) by mouth 3 (three) times a week. 36 tablet 3  . ranolazine (RANEXA) 500 MG 12 hr tablet TAKE 1 TABLET BY MOUTH TWICE A DAY 180 tablet 2  . rosuvastatin (CRESTOR) 40 MG tablet Take 40 mg by mouth daily.     Marland Kitchen saccharomyces boulardii (FLORASTOR) 250 MG capsule Take 250 mg by mouth daily.    . trospium (SANCTURA) 20 MG tablet Take 20 mg by mouth 2 (two) times daily.  3  . valsartan-hydrochlorothiazide (DIOVAN-HCT) 160-12.5 MG tablet Take 0.5 tablets by mouth daily.    . vitamin C (ASCORBIC ACID) 250 MG tablet Take 250 mg by mouth daily.    Marland Kitchen liraglutide (VICTOZA) 18 MG/3ML SOPN Inject 1.8 mg into the skin once a week. 3.6 mL 0   No facility-administered medications prior to visit.     Allergies:   Colchicine, Diclofenac, Janumet [sitagliptin-metformin hcl], Septra [sulfamethoxazole-trimethoprim], Trulicity [dulaglutide], Zetia [ezetimibe], Imipramine, and Tape   Social History   Socioeconomic History  . Marital status: Married    Spouse name: Not on file  . Number of children: Not on file  . Years of education: Not on file  . Highest education level: Not on file   Occupational History  . Occupation: retired  Tobacco Use  . Smoking status: Never Smoker  . Smokeless tobacco: Never Used  Vaping Use  . Vaping Use: Never used  Substance and Sexual Activity  . Alcohol use: Yes    Comment: rare  . Drug use: No  . Sexual activity: Not on file  Other Topics Concern  . Not on file  Social History Narrative  . Not on file   Social Determinants of Health   Financial Resource Strain: Not on file  Food Insecurity: Not on file  Transportation Needs: Not on file  Physical Activity: Not on file  Stress: Not on file  Social Connections: Not on file    Socially she is married for 39 years.  She has 2 children ages 54 and 74 and 4 grandchildren. She was born in [place].  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  Family History:  The patient's family history includes Cancer in her mother; Congenital heart disease in her son; Diabetes in her maternal grandfather; Heart attack in her father, maternal grandfather, paternal uncle, and sister; Heart disease in her father and mother; Hyperlipidemia in her father and mother; Hypertension in her father and mother; Sleep apnea in her father; Sudden death in her father.   Her father died at age 29 with a heart attack.  Mother died at 16 and had dementia.  She has 2 sisters 1 who underwent CABG revascularization  ROS General: Negative; No fevers, chills, or night sweats;  HEENT: Negative; No changes in vision or hearing, sinus congestion, difficulty swallowing Pulmonary: Negative; No cough, wheezing, shortness of breath, hemoptysis Cardiovascular: Hypertension, CAD, hyperlipidemia GI: Negative; No nausea, vomiting, diarrhea, or abdominal pain GU: Negative; No dysuria, hematuria, or difficulty voiding Musculoskeletal: Negative; no myalgias, joint pain, or weakness Hematologic/Oncology: Negative; no easy bruising, bleeding Endocrine: Positive for diabetes mellitus for at least 10 years Neuro:  Negative; no changes in balance, headaches Skin:  Negative; No rashes or skin lesions Psychiatric: Negative; No behavioral problems, depression Sleep: OSA with previous snoring, nonrestorative sleep, and occasional nocturia; now on CPAP Other comprehensive 14 point system review is negative.   PHYSICAL EXAM:   VS:  BP 110/66 (BP Location: Left Arm, Patient Position: Sitting)   Pulse 79   Ht _0  (1.6 m)   Wt 214 lb 6.4 oz (97.3 kg)   SpO2 98%   BMI 37.98 kg/m     Repeat blood pressure by me was 122/76  Wt Readings from Last 3 Encounters:  09/28/20 214 lb 6.4 oz (97.3 kg)  09/14/20 209 lb (94.8 kg)  09/02/20 213 lb (96.6 kg)    General: Alert, oriented, no distress.  Skin: normal turgor, no rashes, warm and dry HEENT: Normocephalic, atraumatic. Pupils equal round and reactive to light; sclera anicteric; extraocular muscles intact;  Nose without nasal septal hypertrophy Mouth/Parynx benign; Mallinpatti scale 3 Neck: No JVD, no carotid bruits; normal carotid upstroke Lungs: clear to ausculatation and percussion; no wheezing or rales Chest wall: without tenderness to palpitation Heart: PMI not displaced, RRR, s1 s2 normal, 1/6 systolic murmur, no diastolic murmur, no rubs, gallops, thrills, or heaves Abdomen: soft, nontender; no hepatosplenomehaly, BS+; abdominal aorta nontender and not dilated by palpation. Back: no CVA tenderness Pulses 2+ Musculoskeletal: full range of motion, normal strength, no joint deformities Extremities: no clubbing cyanosis or edema, Homan's sign negative  Neurologic: grossly nonfocal; Cranial nerves grossly wnl Psychologic: Normal mood and affect   Studies/Labs Reviewed:   EKG:  EKG is ordered today.  ECG (independently read by me): NSR aT 71  Recent Labs: BMP Latest Ref Rng & Units 05/12/2020 04/21/2020 01/07/2020  Glucose 65 - 99 mg/dL 112(H) 104(H) 120(H)  BUN 8 - 27 mg/dL 43(H) 32(H) 13  Creatinine 0.57 - 1.00 mg/dL 1.17(H) 1.13(H) 0.94   BUN/Creat Ratio 12 - 28 37(H) 28 -  Sodium 134 - 144 mmol/L 140 142 140  Potassium 3.5 - 5.2 mmol/L 3.6 4.4 3.9  Chloride 96 - 106 mmol/L 100 99 102  CO2 20 - 29 mmol/L _1 Calcium 8.7 - 10.3 mg/dL 10.5(H) 11.3(H) 8.9     Hepatic Function Latest Ref Rng & Units 04/21/2020  Total Protein 6.0 - 8.5 g/dL 7.5  Albumin 3.7 - 4.7 g/dL 4.8(H)  AST 0 - 40 IU/L 25  ALT 0 - 32 IU/L 25  Alk Phosphatase 44 - 121 IU/L 104  Total Bilirubin 0.0 - 1.2 mg/dL 0.5    CBC Latest Ref Rng & Units 01/07/2020 01/02/2020 05/17/2015  WBC 4.0 - 10.5 K/uL 7.1 8.7 11.6(H)  Hemoglobin 12.0 - 15.0 g/dL 13.2 15.3 10.3(L)  Hematocrit 36.0 - 46.0 % 39.1 44.7 31.5(L)  Platelets 150 - 400 K/uL 225 250 226   Lab Results  Component Value Date   MCV 90.3 01/07/2020   MCV 90 01/02/2020   MCV 85.6 05/17/2015   Lab Results  Component Value Date   TSH 1.71 05/30/2018   Lab Results  Component Value Date   HGBA1C 6.3 (A) 09/02/2020     BNP No results found for: BNP  ProBNP No results found for: PROBNP   Lipid Panel     Component Value Date/Time   CHOL 140 04/21/2020 1209   TRIG 182 (H) 04/21/2020 1209   HDL 38 (L) 04/21/2020 1209   CHOLHDL 3.7 04/21/2020 1209   LDLCALC 71 04/21/2020 1209   LABVLDL 31 04/21/2020 1209     RADIOLOGY: No results  found.   Additional studies/ records that were reviewed today include:   01/31/2020 CLINICAL INFORMATION Sleep Study Type: NPSG  Indication for sleep study: Snoring  Epworth Sleepiness Score: 8  SLEEP STUDY TECHNIQUE As per the AASM Manual for the Scoring of Sleep and Associated Events v2.3 (April 2016) with a hypopnea requiring 4% desaturations.  The channels recorded and monitored were frontal, central and occipital EEG, electrooculogram (EOG), submentalis EMG (chin), nasal and oral airflow, thoracic and abdominal wall motion, anterior tibialis EMG, snore microphone, electrocardiogram, and pulse oximetry.  MEDICATIONS acetaminophen  (TYLENOL ARTHRITIS PAIN) 650 MG CR tablet aspirin EC 81 MG tablet Blood Glucose Monitoring Suppl (ACCU-CHEK GUIDE ME) w/Device KIT carvedilol (COREG) 3.125 MG tablet Cholecalciferol (VITAMIN D) 2000 UNITS CAPS clopidogrel (PLAVIX) 75 MG tablet Coenzyme Q10 (COQ10) 200 MG CAPS diclofenac sodium (VOLTAREN) 1 % GEL DULoxetine (CYMBALTA) 20 MG capsule furosemide (LASIX) 40 MG tablet glucose blood (ACCU-CHEK GUIDE) test strip insulin glargine, 2 Unit Dial, (TOUJEO MAX SOLOSTAR) 300 UNIT/ML Solostar Pen Insulin Pen Needle (PEN NEEDLES) 32G X 5 MM MISC Multiple Vitamin (MULTIVITAMIN WITH MINERALS) TABS tablet nitroGLYCERIN (NITROSTAT) 0.4 MG SL tablet Omega-3 Fatty Acids (RA FISH OIL) 1400 MG CPDR pantoprazole (PROTONIX) 40 MG tablet potassium chloride SA (KLOR-CON) 20 MEQ tablet rosuvastatin (CRESTOR) 20 MG tablet saccharomyces boulardii (FLORASTOR) 250 MG capsule trospium (SANCTURA) 20 MG tablet valsartan-hydrochlorothiazide (DIOVAN-HCT) 160-12.5 MG tablet vitamin C (ASCORBIC ACID) 250 MG tablet Medications self-administered by patient taken the night of the study : CARVEDILOL, FISH OIL, trospium, TYLENOL ALLERGY SINUS, DULOXETINE  SLEEP ARCHITECTURE The study was initiated at 10:54:31 PM and ended at 5:02:37 AM.  Sleep onset time was 61.9 minutes and the sleep efficiency was 60.0%%. The total sleep time was 220.7 minutes.  Stage REM latency was N/A minutes.  The patient spent 1.4%% of the night in stage N1 sleep, 98.6%% in stage N2 sleep, 0.0%% in stage N3 and 0% in REM.  Alpha intrusion was absent.  Supine sleep was 96.60%.  RESPIRATORY PARAMETERS The overall apnea/hypopnea index (AHI) was 18.2 per hour. The respiratory disturbance index (RDI) is 23.1/h. There were 1 total apneas, including 1 obstructive, 0 central and 0 mixed apneas. There were 66 hypopneas and 18 RERAs.  The AHI during Stage REM sleep was N/A per hour.  AHI while supine was 15.8 per hour.  The  mean oxygen saturation was 92.6%. The minimum SpO2 during sleep was 84.0%.  Moderate snoring was noted during this study.  CARDIAC DATA The 2 lead EKG demonstrated sinus rhythm. The mean heart rate was 76.1 beats per minute. Other EKG findings include: isolated 5 beat burst of atrial tachycardia.  LEG MOVEMENT DATA The total PLMS were 0 with a resulting PLMS index of 0.0. Associated arousal with leg movement index was 0.0 .  IMPRESSIONS - Modederate obstructive sleep apnea overall (AHI 18.2/h; RDI 23.1/h) however REM sleep was not present and the overall assessment may be underestimated if REM sleep had occurred. - No significant central sleep apnea occurred during this study (CAI = 0.0/h). - Mild oxygen desaturation to a nadir of 84.0%. - The patient snored with moderate snoring volume. - No cardiac abnormalities were noted during this study. - Clinically significant periodic limb movements did not occur during sleep. No significant associated arousals.  DIAGNOSIS - Obstructive Sleep Apnea (G47.33) - Nocturnal Hypoxemia (G47.36)  RECOMMENDATIONS - Therapeutic CPAP titration to determine optimal pressure required to alleviate sleep disordered breathing. - Effort should be made to optimize nasal and oropharyngeal patency.  -  Avoid alcohol, sedatives and other CNS depressants that may worsen sleep apnea and disrupt normal sleep architecture. - Sleep hygiene should be reviewed to assess factors that may improve sleep quality. - Weight management (BMI 41) and regular exercise should be initiated or continued if appropriate.    CPAP TITRATION 04/24/2020 RESPIRATORY PARAMETERS Optimal PAP Pressure (cm):   AHI at Optimal Pressure (/hr):            N/A Overall Minimal O2 (%):         80.0     Supine % at Optimal Pressure (%):    N/A Minimal O2 at Optimal Pressure (%): 80.0       SLEEP ARCHITECTURE The study was initiated at 9:45:26 PM and ended at 4:47:58 AM.  Sleep onset time  was 107.3 minutes and the sleep efficiency was 40.1%%. The total sleep time was 169.5 minutes.  The patient spent 5.6%% of the night in stage N1 sleep, 94.4%% in stage N2 sleep, 0.0%% in stage N3 and 0% in REM.Stage REM latency was N/A minutes  Wake after sleep onset was 145.7. Alpha intrusion was absent. Supine sleep was 67.55%.  CARDIAC DATA The 2 lead EKG demonstrated sinus rhythm. The mean heart rate was 74.3 beats per minute. Other EKG findings include: None.  LEG MOVEMENT DATA The total Periodic Limb Movements of Sleep (PLMS) were 0. The PLMS index was 0.0. A PLMS index of <15 is considered normal in adults.  IMPRESSIONS - CPAP was initiated at 5 cm and was titrated to 14 cm of water; AHI 5.6/h with O2 nadir 85%. REM sleep did not occur throughout the study.  An optimal PAP pressure could not be selected for this patient based on the available study data. - Central sleep apnea was not noted during this titration (CAI = 0.0/h). - Severe oxygen desaturation to a nadir of 80.0% at 11 cm of water. - Reduced sleep efficiency at only 40.1%. - The patient snored with soft snoring volume during this titration study. - No cardiac abnormalities were observed during this study. - Clinically significant periodic limb movements were not noted during this study. Arousals associated with PLMs were rare.  DIAGNOSIS - Obstructive Sleep Apnea (G47.33)  RECOMMENDATIONS - Recommend an initial trial of CPAP Auto with EPR of 3 at 11- 20 cm H2O and heated humidification.  - Effort should be made to optimize nasal and oropharyngeal patency.  - Avoid alcohol, sedatives and other CNS depressants that may worsen sleep apnea and disrupt normal sleep architecture. - Sleep hygiene should be reviewed to assess factors that may improve sleep quality. - Weight management and regular exercise should be initiated or continued. - Recommend a download in 30 days and sleep clinic evaluation after 4 weeks of  therapy.,   ASSESSMENT:    1. OSA (obstructive sleep apnea)   2. Coronary artery disease involving native coronary artery of native heart without angina pectoris   3. Essential hypertension   4. Obesity, Class II, BMI 35-39.9   5. Type 2 diabetes mellitus with complication, with long-term current use of insulin ([place])   6. Hyperlipidemia with target LDL less than 70     PLAN:  Ms. 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  At that time, she  was unable to achieve any REM sleep.  I reviewed both her PSG as well as her CPAP titration studies today.  On her CPAP titration she also did not achieve any REM sleep.  Her CPAP set up date was June 18, 2020.  Compliance is excellent with 100% use and she is averaging approximately 7 hours of CPAP use per night.  Her CPAP auto unit is set at a range of 8 up to 18 cm of water.  Her 95th percentile pressure is 15 and maximum average pressure 16.7 cm of water.  AHI is excellent at 1.4.  I had a long discussion with her today and reviewed the effects of untreated sleep apnea with reference to her cardiovascular health.  I discussed its effects on hypertension, potential nocturnal arrhythmias, nocturnal ischemia potentially resulting from hypoxemia as well as its effects on glucose, GERD, and inflammation.  Her blood pressure is stable on current therapy and she continues to be on carvedilol 3.125 mg twice a day, valsartan HCT 160/12.5 mg daily furosemide 3 times per week, ranolazine 500 mg twice a day.  She is on DAPT with aspirin/Plavix.  She is diabetic on Victoza and insulin.  She is tolerating rosuvastatin for hyperlipidemia.  I commended her on her excellent compliance.  She will follow up with Dr. Harriet Masson.   Per Medicare guidelines, I will see her in 1 year for a face-to-face sleep evaluation  or sooner as needed.   Medication Adjustments/Labs and Tests Ordered: Current medicines are reviewed at length with the patient today.  Concerns regarding medicines are outlined above.  Medication changes, Labs and Tests ordered today are listed in the Patient Instructions below. Patient Instructions  Medication Instructions:  The current medical regimen is effective;  continue present plan and medications.  *If you need a refill on your cardiac medications before your next appointment, please call your pharmacy*   Follow-Up: At [facility], you and your health needs are our priority.  As part of our continuing mission to provide you with exceptional heart care, we have created designated Provider Care Teams.  These Care Teams include your primary Cardiologist (physician) and Advanced Practice Providers (APPs -  Physician Assistants and Nurse Practitioners) who all work together to provide you with the care you need, when you need it.  We recommend signing up for the patient portal called "MyChart".  Sign up information is provided on this After Visit Summary.  MyChart is used to connect with patients for Virtual Visits (Telemedicine).  Patients are able to view lab/test results, encounter notes, upcoming appointments, etc.  Non-urgent messages can be sent to your provider as well.   To learn more about what you can do with MyChart, go to [email or web address].    Your next appointment:   12 month(s)  The format for your next appointment:   In Person  Provider:   Shelva Majestic, MD   Other Instructions New Mask ordered with [facility]- N30i      Signed, Shelva Majestic, MD  10/04/2020 3:52 PM    [facility] [address] [place], [place], [name]  [address] Phone: [phone]

## 2020-09-28 NOTE — Telephone Encounter (Signed)
Order for N30i mask sent to [facility] per Dr Landry Dyke request.

## 2020-09-28 NOTE — Patient Instructions (Signed)
Medication Instructions:  The current medical regimen is effective;  continue present plan and medications.  *If you need a refill on your cardiac medications before your next appointment, please call your pharmacy*   Follow-Up: At [facility], you and your health needs are our priority.  As part of our continuing mission to provide you with exceptional heart care, we have created designated Provider Care Teams.  These Care Teams include your primary Cardiologist (physician) and Advanced Practice Providers (APPs -  Physician Assistants and Nurse Practitioners) who all work together to provide you with the care you need, when you need it.  We recommend signing up for the patient portal called "MyChart".  Sign up information is provided on this After Visit Summary.  MyChart is used to connect with patients for Virtual Visits (Telemedicine).  Patients are able to view lab/test results, encounter notes, upcoming appointments, etc.  Non-urgent messages can be sent to your provider as well.   To learn more about what you can do with MyChart, go to [email or web address].    Your next appointment:   12 month(s)  The format for your next appointment:   In Person  Provider:   Nicki Guadalajara, MD   Other Instructions New Mask ordered with Choice- N30i

## 2020-10-01 ENCOUNTER — Other Ambulatory Visit: Payer: Self-pay

## 2020-10-01 ENCOUNTER — Ambulatory Visit (INDEPENDENT_AMBULATORY_CARE_PROVIDER_SITE_OTHER): Payer: Medicare PPO | Admitting: Physician Assistant

## 2020-10-01 ENCOUNTER — Encounter (INDEPENDENT_AMBULATORY_CARE_PROVIDER_SITE_OTHER): Payer: Self-pay | Admitting: Physician Assistant

## 2020-10-01 VITALS — BP 116/72 | HR 75 | Temp 98.4°F | Ht 63.0 in | Wt 210.0 lb

## 2020-10-01 DIAGNOSIS — Z6837 Body mass index (BMI) 37.0-37.9, adult: Secondary | ICD-10-CM | POA: Diagnosis not present

## 2020-10-01 DIAGNOSIS — E1169 Type 2 diabetes mellitus with other specified complication: Secondary | ICD-10-CM | POA: Diagnosis not present

## 2020-10-01 DIAGNOSIS — I2510 Atherosclerotic heart disease of native coronary artery without angina pectoris: Secondary | ICD-10-CM

## 2020-10-01 DIAGNOSIS — I10 Essential (primary) hypertension: Secondary | ICD-10-CM

## 2020-10-01 DIAGNOSIS — Z794 Long term (current) use of insulin: Secondary | ICD-10-CM | POA: Diagnosis not present

## 2020-10-02 DIAGNOSIS — G4733 Obstructive sleep apnea (adult) (pediatric): Secondary | ICD-10-CM | POA: Diagnosis not present

## 2020-10-04 ENCOUNTER — Encounter: Payer: Self-pay | Admitting: Cardiovascular Disease

## 2020-10-05 DIAGNOSIS — Z961 Presence of intraocular lens: Secondary | ICD-10-CM | POA: Diagnosis not present

## 2020-10-05 DIAGNOSIS — H524 Presbyopia: Secondary | ICD-10-CM | POA: Diagnosis not present

## 2020-10-05 DIAGNOSIS — E119 Type 2 diabetes mellitus without complications: Secondary | ICD-10-CM | POA: Diagnosis not present

## 2020-10-05 MED ORDER — BD PEN NEEDLE NANO U/F 32G X 4 MM MISC: 1.0000 | Freq: Every day | 0 refills | Status: DC

## 2020-10-05 NOTE — Progress Notes (Signed)
Chief Complaint:   OBESITY Pamela Love is here to discuss her progress with her obesity treatment plan along with follow-up of her obesity related diagnoses. Pamela Love is on following a lower carbohydrate, vegetable and lean protein rich diet plan and states she is following her eating plan approximately 50% of the time. Pamela Love states she is dancing and doing cardio for 90 minutes 2 times per week.  Today's visit was #: 14 Starting weight: 227 lbs Starting date: 03/17/2020 Today's weight: 210 lbs Today's date: 10/01/2020 Total lbs lost to date: 17 Total lbs lost since last in-office visit: 0  Interim History: Today is Pamela Love's birthday and she has been celebrating all week long. She is asking about eating out options.   Subjective:   1. Primary hypertension Pamela Love's blood pressure is well controlled. She is managed by Cardiology. She denies chest pain or headache.   2. Type 2 diabetes mellitus with other specified complication, with long-term current use of insulin (HCC) Pamela Love's BGs range between 102 and 159. Dr. Everardo All decreased Toujeo insulin to 50 units. Last A1c 6.3. She is also on Victoza, and she denies nausea, vomiting, or diarrhea.  Assessment/Plan:   1. Primary hypertension Pamela Love will follow up with Cardiology in May 2022. She will continue working on healthy weight loss and exercise to improve blood pressure control. We will watch for signs of hypotension as she continues her lifestyle modifications.  2. Type 2 diabetes mellitus with other specified complication, with long-term current use of insulin (HCC) Good blood sugar control is important to decrease the likelihood of diabetic complications such as nephropathy, neuropathy, limb loss, blindness, coronary artery disease, and death. Intensive lifestyle modification including diet, exercise and weight loss are the first line of treatment for diabetes. Pamela Love will continue her medications, insulin is to stay at the same dose especially  this week her birthday week. We discussed Ozempic today.  3. Class 2 severe obesity with serious comorbidity and body mass index (BMI) of 37.0 to 37.9 in adult, unspecified obesity type (HCC) Pamela Love is currently in the action stage of change. As such, her goal is to continue with weight loss efforts. She has agreed to the Category 2 Plan.   Exercise goals: As is.  Behavioral modification strategies: meal planning and cooking strategies and keeping healthy foods in the home.  Pamela Love has agreed to follow-up with our clinic in 2 weeks. She was informed of the importance of frequent follow-up visits to maximize her success with intensive lifestyle modifications for her multiple health conditions.   Objective:   Blood pressure 116/72, pulse 75, temperature 98.4 F (36.9 C), height 5\' 3"  (1.6 m), SpO2 98 %. Body mass index is 37.98 kg/m.  General: Cooperative, alert, well developed, in no acute distress. HEENT: Conjunctivae and lids unremarkable. Cardiovascular: Regular rhythm.  Lungs: Normal work of breathing. Neurologic: No focal deficits.   Lab Results  Component Value Date   CREATININE 1.17 (H) 05/12/2020   BUN 43 (H) 05/12/2020   NA 140 05/12/2020   K 3.6 05/12/2020   CL 100 05/12/2020   CO2 24 05/12/2020   Lab Results  Component Value Date   ALT 25 04/21/2020   AST 25 04/21/2020   ALKPHOS 104 04/21/2020   BILITOT 0.5 04/21/2020   Lab Results  Component Value Date   HGBA1C 6.3 (A) 09/02/2020   HGBA1C 6.5 (A) 05/27/2020   HGBA1C 7.9 (H) 04/21/2020   HGBA1C 8.0 (A) 02/20/2020   HGBA1C 7.7 (A) 10/07/2019  No results found for: INSULIN Lab Results  Component Value Date   TSH 1.71 05/30/2018   Lab Results  Component Value Date   CHOL 140 04/21/2020   HDL 38 (L) 04/21/2020   LDLCALC 71 04/21/2020   TRIG 182 (H) 04/21/2020   CHOLHDL 3.7 04/21/2020   Lab Results  Component Value Date   WBC 7.1 01/07/2020   HGB 13.2 01/07/2020   HCT 39.1 01/07/2020   MCV 90.3  01/07/2020   PLT 225 01/07/2020   No results found for: IRON, TIBC, FERRITIN  Obesity Behavioral Intervention:   Approximately 15 minutes were spent on the discussion below.  ASK: We discussed the diagnosis of obesity with Pamela Love today and Pamela Love agreed to give [place] permission to discuss obesity behavioral modification therapy today.  ASSESS: Pamela Love has the diagnosis of obesity and her BMI today is 37.21. Pamela Love is in the action stage of change.   ADVISE: Pamela Love was educated on the multiple health risks of obesity as well as the benefit of weight loss to improve her health. She was advised of the need for long term treatment and the importance of lifestyle modifications to improve her current health and to decrease her risk of future health problems.  AGREE: Multiple dietary modification options and treatment options were discussed and Pamela Love agreed to follow the recommendations documented in the above note.  ARRANGE: Pamela Love was educated on the importance of frequent visits to treat obesity as outlined per CMS and USPSTF guidelines and agreed to schedule her next follow up appointment today.  Attestation Statements:   Reviewed by clinician on day of visit: allergies, medications, problem list, medical history, surgical history, family history, social history, and previous encounter notes.   Trude Mcburney, am acting as transcriptionist for [employer], PA-C.  I have reviewed the above documentation for accuracy and completeness, and I agree with the above. Alois Cliche, PA-C

## 2020-10-09 ENCOUNTER — Telehealth: Payer: Self-pay

## 2020-10-09 ENCOUNTER — Telehealth: Payer: Self-pay | Admitting: Cardiology

## 2020-10-09 NOTE — Telephone Encounter (Signed)
Spoke to patient, see telephone encounter.

## 2020-10-09 NOTE — Telephone Encounter (Signed)
    Pt c/o of Chest Pain: STAT if CP now or developed within 24 hours  1. Are you having CP right now? No  2. Are you experiencing any other symptoms (ex. SOB, nausea, vomiting, sweating)?   3. How long have you been experiencing CP? An hour ago  4. Is your CP continuous or coming and going?   5. Have you taken Nitroglycerin? Yes  Pt had an angina attack an hour ago, after taking nitroglycerin chest pain was gone. ?

## 2020-10-09 NOTE — Telephone Encounter (Signed)
Spoke with patient about her chest pain. She states she was taking down and putting up decorations and started having chest pain, pain in her jaw on the left side and side pain under her left breast. Patient took 2 doses of NTG with pain relief. Patient states she is not having any pain at this time. She was encouraged to rest the rest of the day and retry the activity tomorrow. Patient encouraged to go to the hospital if she continues to have episodes over the weekend. Patient verbalized understanding, no questions expressed at this time.

## 2020-10-12 ENCOUNTER — Telehealth (INDEPENDENT_AMBULATORY_CARE_PROVIDER_SITE_OTHER): Payer: Self-pay

## 2020-10-12 NOTE — Telephone Encounter (Signed)
Pt called in and stated that she would like a call back from the nurse. The pt has an appt on the 21st of the month. The pt is worried that she wont have the refill by the time of her next appt. I advised the pt to use mychart for the refill, she stated she didn't have any refills on the one medication that they were thinking about putting her on. Please advise

## 2020-10-14 NOTE — Telephone Encounter (Signed)
Does dr Earlene Plater have any appointments sooner? She is seeing her for this next visit and I spoke with dr wallace after her last visit about changing her over to ozempic.

## 2020-10-14 NOTE — Telephone Encounter (Signed)
This pt will run out of Victoza on the day she is seen 10/19/2020. She mentioned you were going to change her to Ozempic possibly at that time. It will take her pharmacy a couple of days to get that medication so she will be without anything for a couple of days. Is this okay?

## 2020-10-15 NOTE — Telephone Encounter (Signed)
Patient returned call to April. Please call cell number [phone].

## 2020-10-16 DIAGNOSIS — G4733 Obstructive sleep apnea (adult) (pediatric): Secondary | ICD-10-CM | POA: Diagnosis not present

## 2020-10-19 ENCOUNTER — Ambulatory Visit (INDEPENDENT_AMBULATORY_CARE_PROVIDER_SITE_OTHER): Payer: Medicare PPO | Admitting: Family Medicine

## 2020-10-19 ENCOUNTER — Encounter (INDEPENDENT_AMBULATORY_CARE_PROVIDER_SITE_OTHER): Payer: Self-pay | Admitting: Family Medicine

## 2020-10-19 ENCOUNTER — Other Ambulatory Visit: Payer: Self-pay

## 2020-10-19 VITALS — BP 121/73 | HR 77 | Temp 97.5°F | Ht 63.0 in | Wt 210.0 lb

## 2020-10-19 DIAGNOSIS — I152 Hypertension secondary to endocrine disorders: Secondary | ICD-10-CM

## 2020-10-19 DIAGNOSIS — E1169 Type 2 diabetes mellitus with other specified complication: Secondary | ICD-10-CM | POA: Diagnosis not present

## 2020-10-19 DIAGNOSIS — Z6837 Body mass index (BMI) 37.0-37.9, adult: Secondary | ICD-10-CM

## 2020-10-19 DIAGNOSIS — I25118 Atherosclerotic heart disease of native coronary artery with other forms of angina pectoris: Secondary | ICD-10-CM | POA: Diagnosis not present

## 2020-10-19 DIAGNOSIS — Z794 Long term (current) use of insulin: Secondary | ICD-10-CM

## 2020-10-19 DIAGNOSIS — R6889 Other general symptoms and signs: Secondary | ICD-10-CM

## 2020-10-19 DIAGNOSIS — E1159 Type 2 diabetes mellitus with other circulatory complications: Secondary | ICD-10-CM | POA: Diagnosis not present

## 2020-10-19 MED ORDER — OZEMPIC (0.25 OR 0.5 MG/DOSE) 2 MG/1.5ML ~~LOC~~ SOPN: 0.5000 mg | PEN_INJECTOR | SUBCUTANEOUS | 0 refills | Status: DC

## 2020-10-19 NOTE — Telephone Encounter (Signed)
Will address medication changes at next visit. Keval Nam, CMA

## 2020-10-22 ENCOUNTER — Other Ambulatory Visit: Payer: Self-pay

## 2020-10-22 ENCOUNTER — Ambulatory Visit
Admission: RE | Admit: 2020-10-22 | Discharge: 2020-10-22 | Disposition: A | Discharge status: Home / Self Care | Payer: Medicare PPO | Source: Ambulatory Visit | Attending: Obstetrics and Gynecology | Admitting: Obstetrics and Gynecology

## 2020-10-22 DIAGNOSIS — Z1231 Encounter for screening mammogram for malignant neoplasm of breast: Secondary | ICD-10-CM | POA: Diagnosis not present

## 2020-10-22 NOTE — Progress Notes (Signed)
Chief Complaint:   OBESITY Pamela Love is here to discuss her progress with her obesity treatment plan along with follow-up of her obesity related diagnoses.   Today's visit was #: 15 Starting weight: 227 lbs Starting date: 03/17/2020 Today's weight: 210 lbs Today's date: 10/19/2020 Total lbs lost to date: 17 lbs Body mass index is 37.2 kg/m.  Total weight loss percentage to date: -7.49%  Interim History:  Pamela Love had left chest pain and jaw pain x1.  Took nitroglycerin.  Gone in 10 minutes.  She says she was changing decorations, and was not exercising/exerting herself. She says she feels "stuck" regarding weight loss.  Current Meal Plan: the Category 2 Plan for 65% of the time.  Current Exercise Plan: Dance/cardio/strength training for 120 minutes 2 times per week.. Current Anti-Obesity Medications: Victoza 1.8 mg subcutaneously daily. Side effects: None.  Assessment/Plan:   1. Type 2 diabetes mellitus with other specified complication, with long-term current use of insulin (HCC) Diabetes Mellitus: Not at goal. Medication: Toujeo 40 units daily, Victoza 1.8 mg subcutaneously daily. Issues reviewed: blood sugar goals, complications of diabetes mellitus, hypoglycemia prevention and treatment, exercise, and nutrition.  She is followed by Dr. Everardo All.  Average blood sugars 140s.  Range 100-170s.  Blood sugar 174 after angina.  Plan:  Start Ozempic, as per below.  The importance of regular follow up with PCP and all other specialists as scheduled was stressed to patient today.  Lab Results  Component Value Date   HGBA1C 6.3 (A) 09/02/2020   HGBA1C 6.5 (A) 05/27/2020   HGBA1C 7.9 (H) 04/21/2020   Lab Results  Component Value Date   LDLCALC 71 04/21/2020   CREATININE 1.17 (H) 05/12/2020   - Start Semaglutide,0.25 or 0.5MG /DOS, (OZEMPIC, 0.25 OR 0.5 MG/DOSE,) 2 MG/1.5ML SOPN; Inject 0.5 mg into the skin once a week.  Dispense: 1 each; Refill: 0  2. Hypertension associated with type  2 diabetes mellitus (HCC) At goal. Medications: Coreg 3.125 mg twice daily, valsartan-HCTZ 1/2 tablet daily.  She is followed by Cardiology.  Plan: Avoid buying foods that are: processed, frozen, or prepackaged to avoid excess salt. We will watch for signs of hypotension as she continues lifestyle modifications. We will continue to monitor closely alongside her PCP and/or Specialist.  Regular follow up with PCP and specialists was also encouraged.   BP Readings from Last 3 Encounters:  10/19/20 121/73  10/01/20 116/72  09/28/20 110/66   Lab Results  Component Value Date   CREATININE 1.17 (H) 05/12/2020   3. Coronary artery disease involving native coronary artery of native heart with other form of angina pectoris (HCC) Followed by Cardiology.The current medical regimen is effective; continue present plan and medications. We will continue to monitor symptoms as they relate to her weight loss journey.    4. Exercise intolerance, worsening She had a 1 hour 10 minute session last week.   5. Class 2 severe obesity with serious comorbidity and body mass index (BMI) of 37.0 to 37.9 in adult, unspecified obesity type [facility])  Course: Pamela Love is currently in the action stage of change. As such, her goal is to continue with weight loss efforts.   Nutrition goals: She has agreed to the Category 2 Plan.   Exercise goals: As tolerated.  Behavioral modification strategies: increasing lean protein intake, decreasing simple carbohydrates, increasing vegetables and increasing water intake.  Pamela Love has agreed to follow-up with our clinic in 3 weeks. She was informed of the importance of frequent follow-up visits to  maximize her success with intensive lifestyle modifications for her multiple health conditions.   Objective:   Blood pressure 121/73, pulse 77, temperature (!) 97.5 F (36.4 C), temperature source Oral, height 5\' 3"  (1.6 m), weight 210 lb (95.3 kg), SpO2 99 %. Body mass index is 37.2  kg/m.  General: Cooperative, alert, well developed, in no acute distress. HEENT: Conjunctivae and lids unremarkable. Cardiovascular: Regular rhythm.  Lungs: Normal work of breathing. Neurologic: No focal deficits.   Lab Results  Component Value Date   CREATININE 1.17 (H) 05/12/2020   BUN 43 (H) 05/12/2020   NA 140 05/12/2020   K 3.6 05/12/2020   CL 100 05/12/2020   CO2 24 05/12/2020   Lab Results  Component Value Date   ALT 25 04/21/2020   AST 25 04/21/2020   ALKPHOS 104 04/21/2020   BILITOT 0.5 04/21/2020   Lab Results  Component Value Date   HGBA1C 6.3 (A) 09/02/2020   HGBA1C 6.5 (A) 05/27/2020   HGBA1C 7.9 (H) 04/21/2020   HGBA1C 8.0 (A) 02/20/2020   HGBA1C 7.7 (A) 10/07/2019   Lab Results  Component Value Date   TSH 1.71 05/30/2018   Lab Results  Component Value Date   CHOL 140 04/21/2020   HDL 38 (L) 04/21/2020   LDLCALC 71 04/21/2020   TRIG 182 (H) 04/21/2020   CHOLHDL 3.7 04/21/2020   Lab Results  Component Value Date   WBC 7.1 01/07/2020   HGB 13.2 01/07/2020   HCT 39.1 01/07/2020   MCV 90.3 01/07/2020   PLT 225 01/07/2020   Obesity Behavioral Intervention:   Approximately 15 minutes were spent on the discussion below.  ASK: We discussed the diagnosis of obesity with Pamela Love today and Pamela Love agreed to give Pamela Love permission to discuss obesity behavioral modification therapy today.  ASSESS: Pamela Love has the diagnosis of obesity and her BMI today is 37.2. Pamela Love is in the action stage of change.   ADVISE: Pamela Love was educated on the multiple health risks of obesity as well as the benefit of weight loss to improve her health. She was advised of the need for long term treatment and the importance of lifestyle modifications to improve her current health and to decrease her risk of future health problems.  AGREE: Multiple dietary modification options and treatment options were discussed and Pamela Love agreed to follow the recommendations documented in the above  note.  ARRANGE: Pamela Love was educated on the importance of frequent visits to treat obesity as outlined per CMS and USPSTF guidelines and agreed to schedule her next follow up appointment today.  Attestation Statements:   Reviewed by clinician on day of visit: allergies, medications, problem list, medical history, surgical history, family history, social history, and previous encounter notes.  I, Pamela Love, CMA, am acting as transcriptionist for Insurance claims handler, DO  I have reviewed the above documentation for accuracy and completeness, and I agree with the above. Helane Rima, DO

## 2020-10-29 ENCOUNTER — Encounter (INDEPENDENT_AMBULATORY_CARE_PROVIDER_SITE_OTHER): Payer: Self-pay

## 2020-11-02 ENCOUNTER — Encounter (INDEPENDENT_AMBULATORY_CARE_PROVIDER_SITE_OTHER): Payer: Self-pay | Admitting: Family Medicine

## 2020-11-02 ENCOUNTER — Other Ambulatory Visit (INDEPENDENT_AMBULATORY_CARE_PROVIDER_SITE_OTHER): Payer: Self-pay

## 2020-11-02 DIAGNOSIS — Z794 Long term (current) use of insulin: Secondary | ICD-10-CM

## 2020-11-02 DIAGNOSIS — E1169 Type 2 diabetes mellitus with other specified complication: Secondary | ICD-10-CM

## 2020-11-02 MED ORDER — OZEMPIC (1 MG/DOSE) 4 MG/3ML ~~LOC~~ SOPN: 1.0000 mg | PEN_INJECTOR | SUBCUTANEOUS | 0 refills | Status: DC

## 2020-11-03 ENCOUNTER — Ambulatory Visit (INDEPENDENT_AMBULATORY_CARE_PROVIDER_SITE_OTHER): Payer: Medicare PPO | Admitting: Physician Assistant

## 2020-11-04 ENCOUNTER — Telehealth: Payer: Self-pay | Admitting: Cardiology

## 2020-11-04 NOTE — Telephone Encounter (Signed)
Spoke with patient about Dr. Mallory Shirk recommendations. She verbalized understanding and thanked me for calling her back

## 2020-11-04 NOTE — Telephone Encounter (Signed)
Spoke with patient about her symptoms. She states "I had an angina attack last night, it was under my breast" She took 1 NTG and laid down then she took a second one and the chest pain went away. No jaw pain, this was the least several of all 3 I've had. Denies having chest pain this morning. Will relay information to Dr. Servando Salina.

## 2020-11-04 NOTE — Telephone Encounter (Signed)
Pt c/o of Chest Pain: STAT if CP now or developed within 24 hours  1. Are you having CP right now?  No   2. Are you experiencing any other symptoms (ex. SOB, nausea, vomiting, sweating)? No; lightheadedness; cloudy  3. How long have you been experiencing CP? Last night  4. Is your CP continuous or coming and going?  Coming and going  5. Have you taken Nitroglycerin?  Yes  ?

## 2020-11-04 NOTE — Telephone Encounter (Signed)
Please have her increase her Ranexa to 1000 mg twice daily.

## 2020-11-05 ENCOUNTER — Encounter (INDEPENDENT_AMBULATORY_CARE_PROVIDER_SITE_OTHER): Payer: Self-pay | Admitting: Adult Health

## 2020-11-05 ENCOUNTER — Ambulatory Visit (INDEPENDENT_AMBULATORY_CARE_PROVIDER_SITE_OTHER): Payer: Medicare PPO | Admitting: Adult Health

## 2020-11-05 ENCOUNTER — Other Ambulatory Visit: Payer: Self-pay

## 2020-11-05 VITALS — BP 122/75 | HR 77 | Temp 98.0°F | Ht 63.0 in | Wt 213.0 lb

## 2020-11-05 DIAGNOSIS — Z6841 Body Mass Index (BMI) 40.0 and over, adult: Secondary | ICD-10-CM

## 2020-11-05 DIAGNOSIS — Z794 Long term (current) use of insulin: Secondary | ICD-10-CM | POA: Diagnosis not present

## 2020-11-05 DIAGNOSIS — E1169 Type 2 diabetes mellitus with other specified complication: Secondary | ICD-10-CM

## 2020-11-05 DIAGNOSIS — I25118 Atherosclerotic heart disease of native coronary artery with other forms of angina pectoris: Secondary | ICD-10-CM | POA: Diagnosis not present

## 2020-11-09 NOTE — Progress Notes (Signed)
Chief Complaint:   OBESITY Pamela Love is here to discuss her progress with her obesity treatment plan along with follow-up of her obesity related diagnoses. Pamela Love is on the Category 2 Plan and states she is following her eating plan approximately 60% of the time. Pamela Love states she is going to the gym and dancing 60 minutes 1-3 times per week.  Today's visit was #: 16 Starting weight: 227 lbs Starting date: 03/17/2020 Today's weight: 213 lbs Today's date: 11/05/2020 Total lbs lost to date: 14 lbs Total lbs lost since last in-office visit: 0  Interim History: Breakfast- fruit with yogurt (blueberries, cantaloupe, raspberries, strawberries) Lunch- toasted bread with tomato/added in cottage cheese/cheese- no meat protein Dinner- meat protein with salad.  Pamela Love has increased Ozempic to 1 mg and tolerating it well.  Subjective:   1. Type 2 diabetes mellitus with other specified complication, with long-term current use of insulin (HCC) On 10/19/2020, Dorianne's Victoza was stopped and Ozempic 0.25 mg once weekly was started- BG elevated. Ozempic was increased to 0.5 mg and pt was still experiencing elevated BG. Ozempic was increased to 1 mg- her 1st dose of 1 mg was on 11/03/2020. She is tolerating it well. Akari is also on Toujeo 50 units every AM.  Lab Results  Component Value Date   HGBA1C 6.3 (A) 09/02/2020   HGBA1C 6.5 (A) 05/27/2020   HGBA1C 7.9 (H) 04/21/2020   Lab Results  Component Value Date   LDLCALC 71 04/21/2020   CREATININE 1.17 (H) 05/12/2020   No results found for: INSULIN  2. Coronary artery disease involving native coronary artery of native heart with other form of angina pectoris [facility]) Cardiology increased Brigette's Ranexa dose from 500 mg to 1,000 mg QD. She reports a decline in chest pain with dose increase.  Assessment/Plan:   1. Type 2 diabetes mellitus with other specified complication, with long-term current use of insulin (HCC) Good blood sugar control is  important to decrease the likelihood of diabetic complications such as nephropathy, neuropathy, limb loss, blindness, coronary artery disease, and death. Intensive lifestyle modification including diet, exercise and weight loss are the first line of treatment for diabetes. Continue current anti-diabetic regimen and monitor ambulatory BG. Continue to bring log to follow up appointments.  2. Coronary artery disease involving native coronary artery of native heart with other form of angina pectoris (HCC) Follow up with cardiology as directed.  3. Current BMI 37.8 Pamela Love is currently in the action stage of change. As such, her goal is to continue with weight loss efforts. She has agreed to the Category 2 Plan.   Exercise goals: As is  Behavioral modification strategies: increasing lean protein intake, decreasing simple carbohydrates, no skipping meals, meal planning and cooking strategies, better snacking choices and planning for success.  Disney has agreed to follow-up with our clinic in 2 weeks. She was informed of the importance of frequent follow-up visits to maximize her success with intensive lifestyle modifications for her multiple health conditions.   Objective:   Blood pressure 122/75, pulse 77, temperature 98 F (36.7 C), height 5\' 3"  (1.6 m), weight 213 lb (96.6 kg), SpO2 97 %. Body mass index is 37.73 kg/m.  General: Cooperative, alert, well developed, in no acute distress. HEENT: Conjunctivae and lids unremarkable. Cardiovascular: Regular rhythm.  Lungs: Normal work of breathing. Neurologic: No focal deficits.   Lab Results  Component Value Date   CREATININE 1.17 (H) 05/12/2020   BUN 43 (H) 05/12/2020   NA 140 05/12/2020  K 3.6 05/12/2020   CL 100 05/12/2020   CO2 24 05/12/2020   Lab Results  Component Value Date   ALT 25 04/21/2020   AST 25 04/21/2020   ALKPHOS 104 04/21/2020   BILITOT 0.5 04/21/2020   Lab Results  Component Value Date   HGBA1C 6.3 (A) 09/02/2020    HGBA1C 6.5 (A) 05/27/2020   HGBA1C 7.9 (H) 04/21/2020   HGBA1C 8.0 (A) 02/20/2020   HGBA1C 7.7 (A) 10/07/2019   No results found for: INSULIN Lab Results  Component Value Date   TSH 1.71 05/30/2018   Lab Results  Component Value Date   CHOL 140 04/21/2020   HDL 38 (L) 04/21/2020   LDLCALC 71 04/21/2020   TRIG 182 (H) 04/21/2020   CHOLHDL 3.7 04/21/2020   Lab Results  Component Value Date   WBC 7.1 01/07/2020   HGB 13.2 01/07/2020   HCT 39.1 01/07/2020   MCV 90.3 01/07/2020   PLT 225 01/07/2020    Attestation Statements:   Reviewed by clinician on day of visit: allergies, medications, problem list, medical history, surgical history, family history, social history, and previous encounter notes.  Time spent on visit including pre-visit chart review and post-visit care and charting was 31 minutes.   Edmund Hilda, am acting as Energy manager for William Hamburger, NP.  I have reviewed the above documentation for accuracy and completeness, and I agree with the above. -  Sidda Humm d. Yaresly Menzel, NP-C

## 2020-11-10 ENCOUNTER — Other Ambulatory Visit: Payer: Self-pay | Admitting: Endocrinology

## 2020-11-10 DIAGNOSIS — E1122 Type 2 diabetes mellitus with diabetic chronic kidney disease: Secondary | ICD-10-CM

## 2020-11-10 DIAGNOSIS — N1831 Chronic kidney disease, stage 3a: Secondary | ICD-10-CM

## 2020-11-11 ENCOUNTER — Other Ambulatory Visit: Payer: Self-pay | Admitting: Endocrinology

## 2020-11-11 DIAGNOSIS — N1831 Chronic kidney disease, stage 3a: Secondary | ICD-10-CM

## 2020-11-13 ENCOUNTER — Other Ambulatory Visit (INDEPENDENT_AMBULATORY_CARE_PROVIDER_SITE_OTHER): Payer: Self-pay | Admitting: Family Medicine

## 2020-11-13 DIAGNOSIS — Z794 Long term (current) use of insulin: Secondary | ICD-10-CM

## 2020-11-16 DIAGNOSIS — G4733 Obstructive sleep apnea (adult) (pediatric): Secondary | ICD-10-CM | POA: Diagnosis not present

## 2020-11-16 NOTE — Telephone Encounter (Signed)
Pt last seen by Katy Danford, FNP.  

## 2020-11-18 ENCOUNTER — Ambulatory Visit (INDEPENDENT_AMBULATORY_CARE_PROVIDER_SITE_OTHER): Payer: Medicare PPO | Admitting: Physician Assistant

## 2020-11-18 ENCOUNTER — Encounter (INDEPENDENT_AMBULATORY_CARE_PROVIDER_SITE_OTHER): Payer: Self-pay | Admitting: Physician Assistant

## 2020-11-18 ENCOUNTER — Other Ambulatory Visit: Payer: Self-pay

## 2020-11-18 VITALS — BP 110/70 | HR 79 | Temp 97.9°F | Ht 63.0 in | Wt 209.0 lb

## 2020-11-18 DIAGNOSIS — Z6839 Body mass index (BMI) 39.0-39.9, adult: Secondary | ICD-10-CM | POA: Diagnosis not present

## 2020-11-18 DIAGNOSIS — E1169 Type 2 diabetes mellitus with other specified complication: Secondary | ICD-10-CM

## 2020-11-18 DIAGNOSIS — Z794 Long term (current) use of insulin: Secondary | ICD-10-CM

## 2020-11-23 NOTE — Progress Notes (Signed)
Chief Complaint:   OBESITY Pamela Love is here to discuss her progress with her obesity treatment plan along with follow-up of her obesity related diagnoses. Pamela Love is on the Category 2 Plan and states she is following her eating plan approximately 70% of the time. Pamela Love states she is doing cardio, gym exercise, and dancing for 90 minutes 3 times per week.  Today's visit was #: 17 Starting weight: 277 lbs Starting date: 03/17/2020 Today's weight: 209 lbs Today's date: 11/18/2020 Total lbs lost to date: 68 Total lbs lost since last in-office visit: 4  Interim History: Pamela Love reports that Ozempic 1 mg does not give her stomach cramps, so she finds she can eat more sweets.  Subjective:   1. Type 2 diabetes mellitus with other specified complication, with long-term current use of insulin (HCC) Dr. Everardo All is managing Pamela Love's diabetes mellitus. Her fasting BGs range between 96 and 165. She is on Toujeo and Ozempic 1 mg.  Assessment/Plan:   1. Type 2 diabetes mellitus with other specified complication, with long-term current use of insulin (HCC) Good blood sugar control is important to decrease the likelihood of diabetic complications such as nephropathy, neuropathy, limb loss, blindness, coronary artery disease, and death. Intensive lifestyle modification including diet, exercise and weight loss are the first line of treatment for diabetes. Pamela Love will continue to follow up with Dr. Everardo All, and continue her medications. We discussed increase in Ozempic in May when higher dose is available.  2. Class 2 severe obesity with serious comorbidity and body mass index (BMI) of 39.0 to 39.9 in adult, unspecified obesity type (HCC) Pamela Love is currently in the action stage of change. As such, her goal is to continue with weight loss efforts. She has agreed to the Category 2 Plan.   Exercise goals: As is.  Behavioral modification strategies: meal planning and cooking strategies and keeping healthy foods in  the home.  Pamela Love has agreed to follow-up with our clinic in 2 weeks. She was informed of the importance of frequent follow-up visits to maximize her success with intensive lifestyle modifications for her multiple health conditions.   Objective:   Blood pressure 110/70, pulse 79, temperature 97.9 F (36.6 C), height 5\' 3"  (1.6 m), weight 209 lb (94.8 kg), SpO2 97 %. Body mass index is 37.02 kg/m.  General: Cooperative, alert, well developed, in no acute distress. HEENT: Conjunctivae and lids unremarkable. Cardiovascular: Regular rhythm.  Lungs: Normal work of breathing. Neurologic: No focal deficits.   Lab Results  Component Value Date   CREATININE 1.17 (H) 05/12/2020   BUN 43 (H) 05/12/2020   NA 140 05/12/2020   K 3.6 05/12/2020   CL 100 05/12/2020   CO2 24 05/12/2020   Lab Results  Component Value Date   ALT 25 04/21/2020   AST 25 04/21/2020   ALKPHOS 104 04/21/2020   BILITOT 0.5 04/21/2020   Lab Results  Component Value Date   HGBA1C 6.3 (A) 09/02/2020   HGBA1C 6.5 (A) 05/27/2020   HGBA1C 7.9 (H) 04/21/2020   HGBA1C 8.0 (A) 02/20/2020   HGBA1C 7.7 (A) 10/07/2019   No results found for: INSULIN Lab Results  Component Value Date   TSH 1.71 05/30/2018   Lab Results  Component Value Date   CHOL 140 04/21/2020   HDL 38 (L) 04/21/2020   LDLCALC 71 04/21/2020   TRIG 182 (H) 04/21/2020   CHOLHDL 3.7 04/21/2020   Lab Results  Component Value Date   WBC 7.1 01/07/2020   HGB 13.2  01/07/2020   HCT 39.1 01/07/2020   MCV 90.3 01/07/2020   PLT 225 01/07/2020   No results found for: IRON, TIBC, FERRITIN  Obesity Behavioral Intervention:   Approximately 15 minutes were spent on the discussion below.  ASK: We discussed the diagnosis of obesity with Suzette today and Pamela Love agreed to give [place] permission to discuss obesity behavioral modification therapy today.  ASSESS: Pamela Love has the diagnosis of obesity and her BMI today is 37.03. Pamela Love is in the action stage of  change.   ADVISE: Pamela Love was educated on the multiple health risks of obesity as well as the benefit of weight loss to improve her health. She was advised of the need for long term treatment and the importance of lifestyle modifications to improve her current health and to decrease her risk of future health problems.  AGREE: Multiple dietary modification options and treatment options were discussed and Pamela Love agreed to follow the recommendations documented in the above note.  ARRANGE: Pamela Love was educated on the importance of frequent visits to treat obesity as outlined per CMS and USPSTF guidelines and agreed to schedule her next follow up appointment today.  Attestation Statements:   Reviewed by clinician on day of visit: allergies, medications, problem list, medical history, surgical history, family history, social history, and previous encounter notes.   Trude Mcburney, am acting as transcriptionist for [employer], PA-C.  I have reviewed the above documentation for accuracy and completeness, and I agree with the above. Alois Cliche, PA-C

## 2020-11-29 ENCOUNTER — Encounter: Payer: Self-pay | Admitting: Endocrinology

## 2020-11-29 ENCOUNTER — Encounter (INDEPENDENT_AMBULATORY_CARE_PROVIDER_SITE_OTHER): Payer: Self-pay | Admitting: Family Medicine

## 2020-11-30 ENCOUNTER — Encounter (INDEPENDENT_AMBULATORY_CARE_PROVIDER_SITE_OTHER): Payer: Self-pay | Admitting: Family Medicine

## 2020-11-30 ENCOUNTER — Ambulatory Visit: Payer: Medicare PPO | Admitting: Endocrinology

## 2020-11-30 ENCOUNTER — Telehealth (INDEPENDENT_AMBULATORY_CARE_PROVIDER_SITE_OTHER): Payer: Medicare PPO | Admitting: Family Medicine

## 2020-11-30 ENCOUNTER — Other Ambulatory Visit: Payer: Self-pay

## 2020-11-30 DIAGNOSIS — E785 Hyperlipidemia, unspecified: Secondary | ICD-10-CM | POA: Diagnosis not present

## 2020-11-30 DIAGNOSIS — Z794 Long term (current) use of insulin: Secondary | ICD-10-CM | POA: Diagnosis not present

## 2020-11-30 DIAGNOSIS — I152 Hypertension secondary to endocrine disorders: Secondary | ICD-10-CM

## 2020-11-30 DIAGNOSIS — E1169 Type 2 diabetes mellitus with other specified complication: Secondary | ICD-10-CM

## 2020-11-30 DIAGNOSIS — Z6841 Body Mass Index (BMI) 40.0 and over, adult: Secondary | ICD-10-CM | POA: Diagnosis not present

## 2020-11-30 DIAGNOSIS — E1159 Type 2 diabetes mellitus with other circulatory complications: Secondary | ICD-10-CM | POA: Diagnosis not present

## 2020-11-30 MED ORDER — OZEMPIC (2 MG/DOSE) 8 MG/3ML ~~LOC~~ SOPN: 2.0000 mg | PEN_INJECTOR | SUBCUTANEOUS | 0 refills | Status: DC

## 2020-12-02 DIAGNOSIS — N930 Postcoital and contact bleeding: Secondary | ICD-10-CM | POA: Diagnosis not present

## 2020-12-02 DIAGNOSIS — Z01419 Encounter for gynecological examination (general) (routine) without abnormal findings: Secondary | ICD-10-CM | POA: Diagnosis not present

## 2020-12-07 ENCOUNTER — Telehealth: Payer: Self-pay | Admitting: Endocrinology

## 2020-12-07 ENCOUNTER — Other Ambulatory Visit: Payer: Self-pay

## 2020-12-07 DIAGNOSIS — N1831 Chronic kidney disease, stage 3a: Secondary | ICD-10-CM

## 2020-12-07 MED ORDER — ACCU-CHEK SOFTCLIX LANCETS MISC: 3 refills | Status: DC

## 2020-12-07 NOTE — Progress Notes (Signed)
TeleHealth Visit:  Due to the COVID-19 pandemic, this visit was completed with telemedicine (audio/video) technology to reduce patient and provider exposure as well as to preserve personal protective equipment.   Pamela Love has verbally consented to this TeleHealth visit. The patient is located at home, the provider is located at the [facility] office. The participants in this visit include the listed provider and patient. The visit was conducted today via MyChart video.  Chief Complaint: OBESITY Pamela Love is here to discuss her progress with her obesity treatment plan along with follow-up of her obesity related diagnoses. [name] is on the Category 2 Plan and states she is following her eating plan approximately 70% of the time. Jakita states she is doing cardio and PT/dance for 30-45 minutes 2 times per week.  Today's visit was #: 18 Starting weight: 277 lbs Starting date: 03/17/2020  Interim History: Pamela Love has had an exposure to COVID, but says she feels fine.  She is still having polyphagia.  Assessment/Plan:   1. Type 2 diabetes mellitus with other specified complication, with long-term current use of insulin (HCC) Diabetes Mellitus: Not at goal. Medication: Toujeo 50 units daily, Ozempic 2 mg subcutaneously weekly. Issues reviewed: blood sugar goals, complications of diabetes mellitus, hypoglycemia prevention and treatment, exercise, and nutrition.   Plan: The patient will continue to focus on protein-rich, low simple carbohydrate foods. We reviewed the importance of hydration, regular exercise for stress reduction, and restorative sleep.   Lab Results  Component Value Date   HGBA1C 6.3 (A) 09/02/2020   HGBA1C 6.5 (A) 05/27/2020   HGBA1C 7.9 (H) 04/21/2020   Lab Results  Component Value Date   LDLCALC 71 04/21/2020   CREATININE 1.17 (H) 05/12/2020   - Refill Semaglutide, 2 MG/DOSE, (OZEMPIC, 2 MG/DOSE,) 8 MG/3ML SOPN; Inject 2 mg into the skin once a week.  Dispense: 3  mL; Refill: 0  2. Hypertension associated with type 2 diabetes mellitus (HCC) At goal. Medications: Coreg 3.125 mg twice daily, valsartan-HCTZ 1/2 tablet daily.   Plan: Avoid buying foods that are: processed, frozen, or prepackaged to avoid excess salt. We will watch for signs of hypotension as she continues lifestyle modifications. We will continue to monitor closely alongside her PCP and/or Specialist.    BP Readings from Last 3 Encounters:  11/18/20 110/70  11/05/20 122/75  10/19/20 121/73   Lab Results  Component Value Date   CREATININE 1.17 (H) 05/12/2020   3. Hyperlipidemia with Type 2 diabetes mellitus (HCC) Course: Not at goal. Lipid-lowering medications: Crestor 40 mg daily.   Plan: Dietary changes: Increase soluble fiber, decrease simple carbohydrates, decrease saturated fat. Exercise changes: Moderate to vigorous-intensity aerobic activity 150 minutes per week or as tolerated. We will continue to monitor along with PCP/specialists as it pertains to her weight loss journey.  Lab Results  Component Value Date   CHOL 140 04/21/2020   HDL 38 (L) 04/21/2020   LDLCALC 71 04/21/2020   TRIG 182 (H) 04/21/2020   CHOLHDL 3.7 04/21/2020   Lab Results  Component Value Date   ALT 25 04/21/2020   AST 25 04/21/2020   ALKPHOS 104 04/21/2020   BILITOT 0.5 04/21/2020   4. Obesity, current BMI 37.02  Pamela Love is currently in the action stage of change. As such, her goal is to continue with weight loss efforts. She has agreed to the Category 2 Plan.   Exercise goals: For substantial health benefits, adults should do at least 150 minutes (2 hours and 30 minutes)  a week of moderate-intensity, or 75 minutes (1 hour and 15 minutes) a week of vigorous-intensity aerobic physical activity, or an equivalent combination of moderate- and vigorous-intensity aerobic activity. Aerobic activity should be performed in episodes of at least 10 minutes, and preferably, it should be spread throughout the  week.  Behavioral modification strategies: increasing lean protein intake, decreasing simple carbohydrates, increasing vegetables and increasing water intake.  Pamela Love has agreed to follow-up with our clinic in 2 weeks. She was informed of the importance of frequent follow-up visits to maximize her success with intensive lifestyle modifications for her multiple health conditions.  Objective:   VITALS: Per patient if applicable, see vitals. GENERAL: Alert and in no acute distress. CARDIOPULMONARY: No increased WOB. Speaking in clear sentences.  PSYCH: Pleasant and cooperative. Speech normal rate and rhythm. Affect is appropriate. Insight and judgement are appropriate. Attention is focused, linear, and appropriate.  NEURO: Oriented as arrived to appointment on time with no prompting.   Lab Results  Component Value Date   CREATININE 1.17 (H) 05/12/2020   BUN 43 (H) 05/12/2020   NA 140 05/12/2020   K 3.6 05/12/2020   CL 100 05/12/2020   CO2 24 05/12/2020   Lab Results  Component Value Date   ALT 25 04/21/2020   AST 25 04/21/2020   ALKPHOS 104 04/21/2020   BILITOT 0.5 04/21/2020   Lab Results  Component Value Date   HGBA1C 6.3 (A) 09/02/2020   HGBA1C 6.5 (A) 05/27/2020   HGBA1C 7.9 (H) 04/21/2020   HGBA1C 8.0 (A) 02/20/2020   HGBA1C 7.7 (A) 10/07/2019   Lab Results  Component Value Date   TSH 1.71 05/30/2018   Lab Results  Component Value Date   CHOL 140 04/21/2020   HDL 38 (L) 04/21/2020   LDLCALC 71 04/21/2020   TRIG 182 (H) 04/21/2020   CHOLHDL 3.7 04/21/2020   Lab Results  Component Value Date   WBC 7.1 01/07/2020   HGB 13.2 01/07/2020   HCT 39.1 01/07/2020   MCV 90.3 01/07/2020   PLT 225 01/07/2020   Attestation Statements:   Reviewed by clinician on day of visit: allergies, medications, problem list, medical history, surgical history, family history, social history, and previous encounter notes.  I, Insurance claims handler, CMA, am acting as transcriptionist for Helane Rima, DO  I have reviewed the above documentation for accuracy and completeness, and I agree with the above. Helane Rima, DO

## 2020-12-07 NOTE — Telephone Encounter (Signed)
Pt called to report the wrong lancets were sent in for her. She asks for:  MEDICATION: accu-check soft clix lancets  PHARMACY:   [id number] - [place], [place] - [address] AT [address] Phone:  [phone]  Fax:  [phone]      HAS THE PATIENT CONTACTED THEIR PHARMACY?  yes  IS THIS A 90 DAY SUPPLY : pt doesn't know  IS PATIENT OUT OF MEDICATION: yes  IF NOT; HOW MUCH IS LEFT:   LAST APPOINTMENT DATE: @5 /09/2020  NEXT APPOINTMENT DATE:@Visit  date not found  DO WE HAVE YOUR PERMISSION TO LEAVE A DETAILED MESSAGE?:  OTHER COMMENTS:    **Let patient know to contact pharmacy at the end of the day to make sure medication is ready. **  ** Please notify patient to allow 48-72 hours to process**  **Encourage patient to contact the pharmacy for refills or they can request refills through [facility]**

## 2020-12-09 ENCOUNTER — Other Ambulatory Visit: Payer: Self-pay | Admitting: *Deleted

## 2020-12-09 ENCOUNTER — Telehealth: Payer: Self-pay | Admitting: Endocrinology

## 2020-12-09 MED ORDER — ACCU-CHEK GUIDE VI STRP: 1.0000 | ORAL_STRIP | 5 refills | Status: DC | PRN

## 2020-12-09 NOTE — Telephone Encounter (Signed)
Pt called to report the wrong strips were sent in for her. She asks for:  MEDICATION: accu-check guide me test strips  PHARMACY:   [id number] - [place], [place] - [address] Phone:  [phone]  Fax:  [phone]      HAS THE PATIENT CONTACTED THEIR PHARMACY?  yes  IS THIS A 90 DAY SUPPLY : pt doesn't know  IS PATIENT OUT OF MEDICATION: yes  IF NOT; HOW MUCH IS LEFT:   LAST APPOINTMENT DATE: @5 /09/2020  NEXT APPOINTMENT DATE:@5 /07/2021  DO WE HAVE YOUR PERMISSION TO LEAVE A DETAILED MESSAGE?:  OTHER COMMENTS:    **Let patient know to contact pharmacy at the end of the day to make sure medication is ready. **  ** Please notify patient to allow 48-72 hours to process**  **Encourage patient to contact the pharmacy for refills or they can request refills through [facility]**

## 2020-12-09 NOTE — Telephone Encounter (Signed)
Rx updated and sent to her pharmacy

## 2020-12-10 ENCOUNTER — Other Ambulatory Visit: Payer: Self-pay

## 2020-12-10 ENCOUNTER — Telehealth: Payer: Self-pay | Admitting: Endocrinology

## 2020-12-10 ENCOUNTER — Telehealth: Payer: Self-pay | Admitting: Cardiology

## 2020-12-10 ENCOUNTER — Ambulatory Visit: Payer: Medicare PPO | Admitting: Endocrinology

## 2020-12-10 ENCOUNTER — Other Ambulatory Visit (INDEPENDENT_AMBULATORY_CARE_PROVIDER_SITE_OTHER): Payer: Self-pay | Admitting: Family Medicine

## 2020-12-10 VITALS — BP 114/62 | HR 84 | Ht 63.0 in | Wt 210.6 lb

## 2020-12-10 DIAGNOSIS — N1831 Chronic kidney disease, stage 3a: Secondary | ICD-10-CM

## 2020-12-10 DIAGNOSIS — E1122 Type 2 diabetes mellitus with diabetic chronic kidney disease: Secondary | ICD-10-CM

## 2020-12-10 DIAGNOSIS — E1169 Type 2 diabetes mellitus with other specified complication: Secondary | ICD-10-CM

## 2020-12-10 LAB — POCT GLYCOSYLATED HEMOGLOBIN (HGB A1C): Hemoglobin A1C: 7 % — AB (ref 4.0–5.6)

## 2020-12-10 MED ORDER — TOUJEO MAX SOLOSTAR 300 UNIT/ML ~~LOC~~ SOPN: 45.0000 [IU] | PEN_INJECTOR | SUBCUTANEOUS | 3 refills | Status: DC

## 2020-12-10 NOTE — Telephone Encounter (Signed)
Patient c/o Palpitations:  High priority if patient c/o lightheadedness, shortness of breath, or chest pain  How long have you had palpitations/irregular HR/ Afib? Are you having the symptoms now? Been having [place], but not at this time- started around 5:45 yesterday(12-09-20)  1) Are you currently experiencing lightheadedness, SOB or CP? Lightheaded, jaw pain, teeth pain, under left breast- not at this time*  2) Do you have a history of afib (atrial fibrillation) or irregular heart rhythm?   3) Have you checked your BP or HR? (document readings if available): 30 minutes after episode it was good  4) Are you experiencing any other symptoms? Head feels funny and a headache afterward

## 2020-12-10 NOTE — Patient Instructions (Addendum)
Please reduce the Toujeo to 45 units each morning.   check your blood sugar twice a day.  vary the time of day when you check, between before the 3 meals, and at bedtime.  also check if you have symptoms of your blood sugar being too high or too low.  please keep a record of the readings and bring it to your next appointment here (or you can bring the meter itself).  You can write it on any piece of paper.  please call us sooner if your blood sugar goes below 70, or if you have a lot of readings over 200.   Please come back for a follow-up appointment in 4 months.

## 2020-12-10 NOTE — Telephone Encounter (Signed)
Pt states that while making a sandwich yesterday she began having angina pain which was completely relieved with a total of 3 nitroglycerin. Pt has increased the Ranexa to 1000 mg as you directed. Pt has an appt next week with you. Any recommendations?

## 2020-12-10 NOTE — Telephone Encounter (Signed)
Pt is needing a prescription for lancets. There is only 10 in the box. Insulin Pen Needle (BD PEN NEEDLE NANO U/F) 32G X 4 MM MISC  [id number] - [place], [place] - [address]

## 2020-12-10 NOTE — Progress Notes (Signed)
Subjective:    Patient ID: Pamela Love, female    DOB: 1951-07-16, 70 y.o.   MRN: [id number]  HPI Pt returns for f/u of diabetes mellitus: DM type: Insulin-requiring type 2 Dx'ed: 8850 Complications: CRI and CAD.   Therapy: insulin since 2019, and Ozempic.   GDM: never.  DKA: never.  Severe hypoglycemia: never.  Pancreatitis: never.  Other: she also took insulin for 6 months, in 2016; CRI, vaginitis, and edema limit rx options; she did not tolerate trulicity (nausea), or repaglinide (diarrhea); she declines multiple daily injections.: [facility] rx's Ozempic.   Interval history: she brings a record of her cbg's which I have reviewed today.  cbg varies from 109-177.  She has reduced insulin to 60 units qam.  pt states she feels well in general.  Past Medical History:  Diagnosis Date  . Abnormal nuclear stress test 01/02/2020  . Arthritis   . Bilateral leg edema 01/02/2020  . Bursitis    hips  . C. difficile colitis 12/2017  . CAD (coronary artery disease) 01/06/2020  . CKD (chronic kidney disease), stage III ([place])   . Complication of anesthesia    "spinal did not work with second c section"  . Diabetes mellitus ([place]) 11/19/2015  . Diabetes mellitus, type 2 ([place])    diet controlled - has Rx for Glymipride but has not started taking yet  . Dyslipidemia 10/21/2014  . GERD (gastroesophageal reflux disease)   . History of nonmelanoma skin cancer   . History of skin cancer   . Hypercholesterolemia 12/11/2019  . Hyperlipidemia   . Hypertension   . Hypertensive disorder 10/21/2014  . Lower extremity edema   . Mixed hyperlipidemia 01/02/2020  . OAB (overactive bladder)   . Obesity (BMI 30-39.9) 05/12/2020  . OSA (obstructive sleep apnea) 05/12/2020  . Osteoarthritis of right knee 05/15/2015  . Osteoarthrosis, unspecified whether generalized or localized, involving lower leg 10/21/2014  . Osteoporosis   . Over weight   . Primary osteoarthritis of left knee 12/18/2014  . S/P knee  replacement 12/18/2014  . S/P total knee replacement using cement 05/15/2015  . Sleep apnea   . Tachycardia 12/11/2019    Past Surgical History:  Procedure Laterality Date  . ABDOMINAL HYSTERECTOMY  2005  . CATARACT EXTRACTION    . CESAREAN SECTION     x2  . colonscopy     . CORONARY STENT INTERVENTION N/A 01/06/2020   Procedure: CORONARY STENT INTERVENTION;  Surgeon: Lorretta Harp, MD;  Location: [place] [facility];  Service: Cardiovascular;  Laterality: N/A;  . EYE SURGERY     bilat cataract surgery 2015  . INTRAVASCULAR PRESSURE WIRE/FFR STUDY N/A 01/06/2020   Procedure: INTRAVASCULAR PRESSURE WIRE/FFR STUDY;  Surgeon: Lorretta Harp, MD;  Location: [place] [facility];  Service: Cardiovascular;  Laterality: N/A;  . LEFT HEART CATH AND CORONARY ANGIOGRAPHY N/A 01/06/2020   Procedure: LEFT HEART CATH AND CORONARY ANGIOGRAPHY;  Surgeon: Lorretta Harp, MD;  Location: [place] [facility];  Service: Cardiovascular;  Laterality: N/A;  . TOTAL KNEE ARTHROPLASTY Left 12/18/2014   Procedure: TOTAL LEFT KNEE ARTHROPLASTY;  Surgeon: Sydnee Cabal, MD;  Location: [facility];  Service: Orthopedics;  Laterality: Left;  . TOTAL KNEE ARTHROPLASTY Right 05/15/2015   Procedure: RIGHT TOTAL KNEE ARTHROPLASTY;  Surgeon: Sydnee Cabal, MD;  Location: [facility];  Service: Orthopedics;  Laterality: Right;  . UTERINE FIBROID SURGERY      Social History   Socioeconomic History  . Marital status: Married  Spouse name: Not on file  . Number of children: Not on file  . Years of education: Not on file  . Highest education level: Not on file  Occupational History  . Occupation: retired  Tobacco Use  . Smoking status: Never Smoker  . Smokeless tobacco: Never Used  Vaping Use  . Vaping Use: Never used  Substance and Sexual Activity  . Alcohol use: Yes    Comment: rare  . Drug use: No  . Sexual activity: Not on file  Other Topics Concern  . Not on file  Social History Narrative  . Not on file    Social Determinants of Health   Financial Resource Strain: Not on file  Food Insecurity: Not on file  Transportation Needs: Not on file  Physical Activity: Not on file  Stress: Not on file  Social Connections: Not on file  Intimate Partner Violence: Not on file    Current Outpatient Medications on File Prior to Visit  Medication Sig Dispense Refill  . Accu-Chek Softclix Lancets lancets USE 1 TO TEST BLOOD SUGAR IN THE MORNING AND AT BEDTIME. E11.9 200 each 3  . acetaminophen (TYLENOL) 650 MG CR tablet Take 1,300 mg by mouth at bedtime.    Marland Kitchen aspirin EC 81 MG tablet Take 1 tablet (81 mg total) by mouth daily. 90 tablet 3  . Blood Glucose Monitoring Suppl (ACCU-CHEK GUIDE ME) w/Device KIT 1 each by Does not apply route 2 (two) times daily. E11.9 1 kit 0  . carvedilol (COREG) 3.125 MG tablet Take 1 tablet (3.125 mg total) by mouth 2 (two) times daily with a meal. 60 tablet 11  . Cholecalciferol (VITAMIN D) 2000 UNITS CAPS Take 2,000 Units by mouth every morning.     . clopidogrel (PLAVIX) 75 MG tablet Take 1 tablet (75 mg total) by mouth daily with breakfast. 30 tablet 11  . Coenzyme Q10 (COQ10) 200 MG CAPS Take 200 mg by mouth daily.     . diclofenac sodium (VOLTAREN) 1 % GEL Apply 1 application topically at bedtime as needed (knee pain.).     Marland Kitchen DULoxetine (CYMBALTA) 20 MG capsule Take 40 mg by mouth at bedtime.     Marland Kitchen glucose blood (ACCU-CHEK GUIDE) test strip 1 each by Other route as needed for other. Use as instructed to check blood sugar 2 times per day dx code E11.9 100 each 5  . Multiple Vitamin (MULTIVITAMIN WITH MINERALS) TABS tablet Take 1 tablet by mouth every morning.     . Omega-3 Fatty Acids (RA FISH OIL) 1400 MG CPDR Take 1,400 mg by mouth 2 (two) times daily.    . potassium chloride SA (KLOR-CON) 20 MEQ tablet Take 1 tablet (20 mEq total) by mouth 3 (three) times a week. 36 tablet 3  . ranolazine (RANEXA) 500 MG 12 hr tablet TAKE 1 TABLET BY MOUTH TWICE A DAY (Patient taking  differently: 2 tablets (1059m) bid) 180 tablet 2  . rosuvastatin (CRESTOR) 40 MG tablet Take 40 mg by mouth daily.     .Marland Kitchensaccharomyces boulardii (FLORASTOR) 250 MG capsule Take 250 mg by mouth daily.    . Semaglutide, 2 MG/DOSE, (OZEMPIC, 2 MG/DOSE,) 8 MG/3ML SOPN Inject 2 mg into the skin once a week. 3 mL 0  . trospium (SANCTURA) 20 MG tablet Take 20 mg by mouth 2 (two) times daily.  3  . valsartan-hydrochlorothiazide (DIOVAN-HCT) 160-12.5 MG tablet Take 0.5 tablets by mouth daily.    . vitamin C (ASCORBIC ACID) 250 MG tablet  Take 250 mg by mouth daily.    . furosemide (LASIX) 40 MG tablet Take 1 tablet (40 mg total) by mouth 3 (three) times a week. 36 tablet 3  . nitroGLYCERIN (NITROSTAT) 0.4 MG SL tablet Place 1 tablet (0.4 mg total) under the tongue every 5 (five) minutes as needed for chest pain. 90 tablet 3   No current facility-administered medications on file prior to visit.    Allergies  Allergen Reactions  . Colchicine     Stomach cramps  . Diclofenac     Oral causes stomach cramps  . Janumet [Sitagliptin-Metformin Hcl] Nausea And Vomiting    Lightheaded   . Septra [Sulfamethoxazole-Trimethoprim] Itching  . Trulicity [Dulaglutide] Diarrhea and Nausea And Vomiting  . Zetia [Ezetimibe]     Muscle pain  . Imipramine Rash  . Tape Rash    Paper tape ok to use     Family History  Problem Relation Age of Onset  . Diabetes Maternal Grandfather   . Heart attack Maternal Grandfather   . Heart disease Mother   . Hypertension Mother   . Hyperlipidemia Mother   . Cancer Mother   . Heart attack Father   . Hypertension Father   . Hyperlipidemia Father   . Heart disease Father   . Sudden death Father   . Sleep apnea Father   . Heart attack Sister   . Heart attack Paternal Uncle   . Congenital heart disease Son     BP 114/62 (BP Location: Right Arm, Patient Position: Sitting, Cuff Size: Large)   Pulse 84   Ht _0  (1.6 m)   Wt 210 lb 9.6 oz (95.5 kg)   SpO2 95%   BMI  37.31 kg/m    Review of Systems     Objective:   Physical Exam VITAL SIGNS:  See vs page GENERAL: no distress Pulses: dorsalis pedis intact bilat.   MSK: no deformity of the feet CV: 1+ bilat leg edema Skin:  no ulcer on the feet.  normal color and temp on the feet. Neuro: sensation is intact to touch on the feet   A1c=7.0%    Assessment & Plan:  Insulin-requiring type 2 DM: overcontrolled.   Patient Instructions  Please reduce the Toujeo to 45 units each morning.   check your blood sugar twice a day.  vary the time of day when you check, between before the 3 meals, and at bedtime.  also check if you have symptoms of your blood sugar being too high or too low.  please keep a record of the readings and bring it to your next appointment here (or you can bring the meter itself).  You can write it on any piece of paper.  please call us sooner if your blood sugar goes below 70, or if you have a lot of readings over 200.   Please come back for a follow-up appointment in 4 months.

## 2020-12-11 ENCOUNTER — Other Ambulatory Visit: Payer: Self-pay

## 2020-12-11 DIAGNOSIS — Z794 Long term (current) use of insulin: Secondary | ICD-10-CM

## 2020-12-11 MED ORDER — BD PEN NEEDLE NANO U/F 32G X 4 MM MISC: 1.0000 | Freq: Every day | 0 refills | Status: DC

## 2020-12-14 ENCOUNTER — Other Ambulatory Visit: Payer: Self-pay

## 2020-12-14 ENCOUNTER — Encounter (INDEPENDENT_AMBULATORY_CARE_PROVIDER_SITE_OTHER): Payer: Self-pay | Admitting: Physician Assistant

## 2020-12-14 ENCOUNTER — Ambulatory Visit (INDEPENDENT_AMBULATORY_CARE_PROVIDER_SITE_OTHER): Payer: Medicare PPO | Admitting: Physician Assistant

## 2020-12-14 VITALS — BP 93/60 | HR 85 | Temp 98.6°F | Ht 63.0 in | Wt 204.0 lb

## 2020-12-14 DIAGNOSIS — E1159 Type 2 diabetes mellitus with other circulatory complications: Secondary | ICD-10-CM

## 2020-12-14 DIAGNOSIS — E1169 Type 2 diabetes mellitus with other specified complication: Secondary | ICD-10-CM | POA: Diagnosis not present

## 2020-12-14 DIAGNOSIS — Z794 Long term (current) use of insulin: Secondary | ICD-10-CM | POA: Diagnosis not present

## 2020-12-14 DIAGNOSIS — I152 Hypertension secondary to endocrine disorders: Secondary | ICD-10-CM | POA: Diagnosis not present

## 2020-12-14 DIAGNOSIS — Z6839 Body mass index (BMI) 39.0-39.9, adult: Secondary | ICD-10-CM

## 2020-12-14 MED ORDER — OZEMPIC (2 MG/DOSE) 8 MG/3ML ~~LOC~~ SOPN: 2.0000 mg | PEN_INJECTOR | SUBCUTANEOUS | 0 refills | Status: DC

## 2020-12-14 NOTE — Progress Notes (Signed)
Chief Complaint:   OBESITY Yuvia is here to discuss her progress with her obesity treatment plan along with follow-up of her obesity related diagnoses. Shanece is on the Category 2 Plan and states she is following her eating plan approximately 80% of the time. Donta states she is doing cardio and dancing for 45-90 minutes 3 times per week.  Today's visit was #: 19 Starting weight: 277 lbs Starting date: 03/17/2020 Today's weight: 204 lbs Today's date: 12/14/2020 Total lbs lost to date: 73 Total lbs lost since last in-office visit: 5  Interim History: Deja did very well with weight loss. She reports that the increase in Ozempic to 2 mg recently decreased her appetite. She is not snacking as much.  Subjective:   1. Type 2 diabetes mellitus with other specified complication, with long-term current use of insulin (HCC) Raelie is on Toujeo and Ozempic, and she denies hypoglycemia or polyphagia. She is followed by Dr. Everardo All.  2. Hypertension associated with type 2 diabetes mellitus (HCC) Melora's blood pressure is slightly low today, and she is asymptomatic. She will see her Cardiologist tomorrow. She periodically checks her blood pressure at home.  Assessment/Plan:   1. Type 2 diabetes mellitus with other specified complication, with long-term current use of insulin (HCC) Lena will continue her medications and will follow up with Dr. Everardo All. We will refill Ozempic 2 mg q weekly for 1 month. Good blood sugar control is important to decrease the likelihood of diabetic complications such as nephropathy, neuropathy, limb loss, blindness, coronary artery disease, and death. Intensive lifestyle modification including diet, exercise and weight loss are the first line of treatment for diabetes.   2. Hypertension associated with type 2 diabetes mellitus (HCC) Nalia will follow up with her Cardiologist tomorrow. She will continue working on healthy weight loss and exercise to improve blood  pressure control. We will watch for signs of hypotension as she continues her lifestyle modifications.  3. Class 2 severe obesity with serious comorbidity and body mass index (BMI) of 39.0 to 39.9 in adult, unspecified obesity type (HCC) Dacey is currently in the action stage of change. As such, her goal is to continue with weight loss efforts. She has agreed to the Category 2 Plan.   Exercise goals: As is.  Behavioral modification strategies: meal planning and cooking strategies.  Totiana has agreed to follow-up with our clinic in 2 weeks. She was informed of the importance of frequent follow-up visits to maximize her success with intensive lifestyle modifications for her multiple health conditions.   Objective:   Blood pressure 93/60, pulse 85, temperature 98.6 F (37 C), height 5\' 3"  (1.6 m), weight 204 lb (92.5 kg), SpO2 97 %. Body mass index is 36.14 kg/m.  General: Cooperative, alert, well developed, in no acute distress. HEENT: Conjunctivae and lids unremarkable. Cardiovascular: Regular rhythm.  Lungs: Normal work of breathing. Neurologic: No focal deficits.   Lab Results  Component Value Date   CREATININE 1.17 (H) 05/12/2020   BUN 43 (H) 05/12/2020   NA 140 05/12/2020   K 3.6 05/12/2020   CL 100 05/12/2020   CO2 24 05/12/2020   Lab Results  Component Value Date   ALT 25 04/21/2020   AST 25 04/21/2020   ALKPHOS 104 04/21/2020   BILITOT 0.5 04/21/2020   Lab Results  Component Value Date   HGBA1C 7.0 (A) 12/10/2020   HGBA1C 6.3 (A) 09/02/2020   HGBA1C 6.5 (A) 05/27/2020   HGBA1C 7.9 (H) 04/21/2020   HGBA1C  8.0 (A) 02/20/2020   No results found for: INSULIN Lab Results  Component Value Date   TSH 1.71 05/30/2018   Lab Results  Component Value Date   CHOL 140 04/21/2020   HDL 38 (L) 04/21/2020   LDLCALC 71 04/21/2020   TRIG 182 (H) 04/21/2020   CHOLHDL 3.7 04/21/2020   Lab Results  Component Value Date   WBC 7.1 01/07/2020   HGB 13.2 01/07/2020   HCT  39.1 01/07/2020   MCV 90.3 01/07/2020   PLT 225 01/07/2020   No results found for: IRON, TIBC, FERRITIN  Obesity Behavioral Intervention:   Approximately 15 minutes were spent on the discussion below.  ASK: We discussed the diagnosis of obesity with Lahela today and Nakiesha agreed to give [place] permission to discuss obesity behavioral modification therapy today.  ASSESS: Spenser has the diagnosis of obesity and her BMI today is 36.15. Kandance is in the action stage of change.   ADVISE: Ilanna was educated on the multiple health risks of obesity as well as the benefit of weight loss to improve her health. She was advised of the need for long term treatment and the importance of lifestyle modifications to improve her current health and to decrease her risk of future health problems.  AGREE: Multiple dietary modification options and treatment options were discussed and Wilhemina agreed to follow the recommendations documented in the above note.  ARRANGE: Birttany was educated on the importance of frequent visits to treat obesity as outlined per CMS and USPSTF guidelines and agreed to schedule her next follow up appointment today.  Attestation Statements:   Reviewed by clinician on day of visit: allergies, medications, problem list, medical history, surgical history, family history, social history, and previous encounter notes.   Trude Mcburney, am acting as transcriptionist for [employer], PA-C.  I have reviewed the above documentation for accuracy and completeness, and I agree with the above. Alois Cliche, PA-C

## 2020-12-15 ENCOUNTER — Telehealth: Payer: Self-pay | Admitting: Endocrinology

## 2020-12-15 ENCOUNTER — Ambulatory Visit: Payer: Medicare PPO | Admitting: Cardiology

## 2020-12-15 ENCOUNTER — Encounter: Payer: Self-pay | Admitting: Cardiology

## 2020-12-15 VITALS — BP 122/72 | HR 83 | Ht 63.0 in | Wt 208.6 lb

## 2020-12-15 DIAGNOSIS — I10 Essential (primary) hypertension: Secondary | ICD-10-CM | POA: Diagnosis not present

## 2020-12-15 DIAGNOSIS — I2510 Atherosclerotic heart disease of native coronary artery without angina pectoris: Secondary | ICD-10-CM

## 2020-12-15 DIAGNOSIS — G4733 Obstructive sleep apnea (adult) (pediatric): Secondary | ICD-10-CM

## 2020-12-15 DIAGNOSIS — E669 Obesity, unspecified: Secondary | ICD-10-CM

## 2020-12-15 DIAGNOSIS — E782 Mixed hyperlipidemia: Secondary | ICD-10-CM

## 2020-12-15 DIAGNOSIS — R0602 Shortness of breath: Secondary | ICD-10-CM | POA: Diagnosis not present

## 2020-12-15 MED ORDER — ISOSORBIDE MONONITRATE ER 30 MG PO TB24: 30.0000 mg | ORAL_TABLET | Freq: Every day | ORAL | 3 refills | Status: DC

## 2020-12-15 NOTE — Progress Notes (Signed)
Cardiology Office Note:    Date:  12/15/2020   ID:  [name], DOB [birth year], MRN [id number]  PCP:  Mayra Neer, MD  Cardiologist:  Berniece Salines, DO  Electrophysiologist:  None   Referring MD: Mayra Neer, MD     History of Present Illness:    Pamela Love is a 70 y.o. female with a hx of coronary artery disease status post stent on January 06, 2020 to RCA as a result of a positive stress test, hypertension, diabetes mellitus, hyperlipidemiaand sleep apnea.  I  saw the patient in October 2021 at that time she was experiencing some stable angina I added Ranexa 500 mg twice a day to her medical regimen.  She is here today for follow-up visit.  She is happy with the Ranexa and tells me that she had not had any repeated episodes since she was started on this medicine.  I saw the patient on June 15, 2020 at that time we continued her Ranexa, her dual antiplatelet therapy as well as her statin and beta-blockers.  She had just been diagnosed with sleep apnea therefore she had a lot of questions about this.  In the interim the patient called reporting that she felt that she may have been experiencing some angina.  I increase the Ranexa to 1000 mg twice daily and she is here now for follow-up visit.  The patient is here today with her husband and sensor because she has had 1 episode of intermittent chest pain.  She also tells me that she has been experiencing some shortness of breath.   Past Medical History:  Diagnosis Date  . Abnormal nuclear stress test 01/02/2020  . Arthritis   . Bilateral leg edema 01/02/2020  . Bursitis    hips  . C. difficile colitis 12/2017  . CAD (coronary artery disease) 01/06/2020  . CKD (chronic kidney disease), stage III ([place])   . Complication of anesthesia    "spinal did not work with second c section"  . Diabetes mellitus ([place]) 11/19/2015  . Diabetes mellitus, type 2 ([place])    diet controlled - has Rx for Glymipride but has not  started taking yet  . Dyslipidemia 10/21/2014  . GERD (gastroesophageal reflux disease)   . History of nonmelanoma skin cancer   . History of skin cancer   . Hypercholesterolemia 12/11/2019  . Hyperlipidemia   . Hypertension   . Hypertensive disorder 10/21/2014  . Lower extremity edema   . Mixed hyperlipidemia 01/02/2020  . OAB (overactive bladder)   . Obesity (BMI 30-39.9) 05/12/2020  . OSA (obstructive sleep apnea) 05/12/2020  . Osteoarthritis of right knee 05/15/2015  . Osteoarthrosis, unspecified whether generalized or localized, involving lower leg 10/21/2014  . Osteoporosis   . Over weight   . Primary osteoarthritis of left knee 12/18/2014  . S/P knee replacement 12/18/2014  . S/P total knee replacement using cement 05/15/2015  . Sleep apnea   . Tachycardia 12/11/2019    Past Surgical History:  Procedure Laterality Date  . ABDOMINAL HYSTERECTOMY  2005  . CATARACT EXTRACTION    . CESAREAN SECTION     x2  . colonscopy     . CORONARY STENT INTERVENTION N/A 01/06/2020   Procedure: CORONARY STENT INTERVENTION;  Surgeon: Lorretta Harp, MD;  Location: [facility];  Service: Cardiovascular;  Laterality: N/A;  . EYE SURGERY     bilat cataract surgery 2015  . INTRAVASCULAR PRESSURE WIRE/FFR STUDY N/A 01/06/2020   Procedure: INTRAVASCULAR PRESSURE WIRE/FFR  STUDY;  Surgeon: Lorretta Harp, MD;  Location: [facility];  Service: Cardiovascular;  Laterality: N/A;  . LEFT HEART CATH AND CORONARY ANGIOGRAPHY N/A 01/06/2020   Procedure: LEFT HEART CATH AND CORONARY ANGIOGRAPHY;  Surgeon: Lorretta Harp, MD;  Location: [facility];  Service: Cardiovascular;  Laterality: N/A;  . TOTAL KNEE ARTHROPLASTY Left 12/18/2014   Procedure: TOTAL LEFT KNEE ARTHROPLASTY;  Surgeon: Sydnee Cabal, MD;  Location: [facility];  Service: Orthopedics;  Laterality: Left;  . TOTAL KNEE ARTHROPLASTY Right 05/15/2015   Procedure: RIGHT TOTAL KNEE ARTHROPLASTY;  Surgeon: Sydnee Cabal, MD;   Location: [facility];  Service: Orthopedics;  Laterality: Right;  . UTERINE FIBROID SURGERY      Current Medications: Current Meds  Medication Sig  . Accu-Chek Softclix Lancets lancets USE 1 TO TEST BLOOD SUGAR IN THE MORNING AND AT BEDTIME. E11.9  . acetaminophen (TYLENOL) 650 MG CR tablet Take 1,300 mg by mouth at bedtime.  Marland Kitchen aspirin EC 81 MG tablet Take 1 tablet (81 mg total) by mouth daily.  . Blood Glucose Monitoring Suppl (ACCU-CHEK GUIDE ME) w/Device KIT 1 each by Does not apply route 2 (two) times daily. E11.9  . carvedilol (COREG) 3.125 MG tablet Take 1 tablet (3.125 mg total) by mouth 2 (two) times daily with a meal.  . Cholecalciferol (VITAMIN D) 2000 UNITS CAPS Take 2,000 Units by mouth every morning.   . clopidogrel (PLAVIX) 75 MG tablet Take 1 tablet (75 mg total) by mouth daily with breakfast.  . Coenzyme Q10 (COQ10) 200 MG CAPS Take 200 mg by mouth daily.   . diclofenac sodium (VOLTAREN) 1 % GEL Apply 1 application topically at bedtime as needed (knee pain.).   Marland Kitchen DULoxetine (CYMBALTA) 20 MG capsule Take 40 mg by mouth at bedtime.   Marland Kitchen glucose blood (ACCU-CHEK GUIDE) test strip 1 each by Other route as needed for other. Use as instructed to check blood sugar 2 times per day dx code E11.9  . insulin glargine, 2 Unit Dial, (TOUJEO MAX SOLOSTAR) 300 UNIT/ML Solostar Pen Inject 46 Units into the skin every morning. And 4 mm pen needles 2/day  . Insulin Pen Needle (BD PEN NEEDLE NANO U/F) 32G X 4 MM MISC 1 each by Does not apply route daily.  . Multiple Vitamin (MULTIVITAMIN WITH MINERALS) TABS tablet Take 1 tablet by mouth every morning.   . Omega-3 Fatty Acids (RA FISH OIL) 1400 MG CPDR Take 1,400 mg by mouth 2 (two) times daily.  . potassium chloride SA (KLOR-CON) 20 MEQ tablet Take 1 tablet (20 mEq total) by mouth 3 (three) times a week.  . ranolazine (RANEXA) 1000 MG SR tablet Take 1,000 mg by mouth 2 (two) times daily.  . rosuvastatin (CRESTOR) 40 MG tablet Take 40 mg by mouth  daily.   Marland Kitchen saccharomyces boulardii (FLORASTOR) 250 MG capsule Take 250 mg by mouth daily.  . Semaglutide, 2 MG/DOSE, (OZEMPIC, 2 MG/DOSE,) 8 MG/3ML SOPN Inject 2 mg into the skin once a week.  . trospium (SANCTURA) 20 MG tablet Take 20 mg by mouth 2 (two) times daily.  . valsartan-hydrochlorothiazide (DIOVAN-HCT) 160-12.5 MG tablet Take 0.5 tablets by mouth daily.  . vitamin C (ASCORBIC ACID) 250 MG tablet Take 250 mg by mouth daily.  . [DISCONTINUED] ranolazine (RANEXA) 500 MG 12 hr tablet TAKE 1 TABLET BY MOUTH TWICE A DAY (Patient taking differently: 2 tablets (1076m) bid)     Allergies:   Colchicine, Diclofenac, Janumet [sitagliptin-metformin hcl], Septra [sulfamethoxazole-trimethoprim],  Trulicity [dulaglutide], Zetia [ezetimibe], Imipramine, and Tape   Social History   Socioeconomic History  . Marital status: Married    Spouse name: Not on file  . Number of children: Not on file  . Years of education: Not on file  . Highest education level: Not on file  Occupational History  . Occupation: retired  Tobacco Use  . Smoking status: Never Smoker  . Smokeless tobacco: Never Used  Vaping Use  . Vaping Use: Never used  Substance and Sexual Activity  . Alcohol use: Yes    Comment: rare  . Drug use: No  . Sexual activity: Not on file  Other Topics Concern  . Not on file  Social History Narrative  . Not on file   Social Determinants of Health   Financial Resource Strain: Not on file  Food Insecurity: Not on file  Transportation Needs: Not on file  Physical Activity: Not on file  Stress: Not on file  Social Connections: Not on file     Family History: The patient's family history includes Cancer in her mother; Congenital heart disease in her son; Diabetes in her maternal grandfather; Heart attack in her father, maternal grandfather, paternal uncle, and sister; Heart disease in her father and mother; Hyperlipidemia in her father and mother; Hypertension in her father and  mother; Sleep apnea in her father; Sudden death in her father.  ROS:   Review of Systems  Constitution: Negative for decreased appetite, fever and weight gain.  HENT: Negative for congestion, ear discharge, hoarse voice and sore throat.   Eyes: Negative for discharge, redness, vision loss in right eye and visual halos.  Cardiovascular: Negative for chest pain, dyspnea on exertion, leg swelling, orthopnea and palpitations.  Respiratory: Negative for cough, hemoptysis, shortness of breath and snoring.   Endocrine: Negative for heat intolerance and polyphagia.  Hematologic/Lymphatic: Negative for bleeding problem. Does not bruise/bleed easily.  Skin: Negative for flushing, nail changes, rash and suspicious lesions.  Musculoskeletal: Negative for arthritis, joint pain, muscle cramps, myalgias, neck pain and stiffness.  Gastrointestinal: Negative for abdominal pain, bowel incontinence, diarrhea and excessive appetite.  Genitourinary: Negative for decreased libido, genital sores and incomplete emptying.  Neurological: Negative for brief paralysis, focal weakness, headaches and loss of balance.  Psychiatric/Behavioral: Negative for altered mental status, depression and suicidal ideas.  Allergic/Immunologic: Negative for HIV exposure and persistent infections.    EKGs/Labs/Other Studies Reviewed:    The following studies were reviewed today:   EKG: None today  Echocardiogram done May 2021 IMPRESSIONS  1. Left ventricular ejection fraction, by estimation, is 60 to 65%. The  left ventricle has normal function. The left ventricle has no regional  wall motion abnormalities. Left ventricular diastolic parameters are  consistent with Grade I diastolic  dysfunction (impaired relaxation).  2. Right ventricular systolic function is normal. The right ventricular  size is normal.  3. The mitral valve is normal in structure. No evidence of mitral valve  regurgitation. No evidence of mitral  stenosis.  4. The aortic valve is tricuspid. Aortic valve regurgitation is not  visualized. Mild aortic valve sclerosis is present, with no evidence of  aortic valve stenosis.  5. The inferior vena cava is normal in size with greater than 50%  respiratory variability, suggesting right atrial pressure of 3 mmHg.   Left heart catheterization June 2021   Prox LAD to Mid LAD lesion is 60% stenosed.  Prox RCA to Mid RCA lesion is 90% stenosed.  A drug-eluting stent was successfully  placed using a [place] [id number].  Post intervention, there is a 0% residual stenosis.      Recent Labs: 01/07/2020: Hemoglobin 13.2; Platelets 225 04/21/2020: ALT 25 05/12/2020: BUN 43; Creatinine, Ser 1.17; Magnesium 2.0; Potassium 3.6; Sodium 140  Recent Lipid Panel    Component Value Date/Time   CHOL 140 04/21/2020 1209   TRIG 182 (H) 04/21/2020 1209   HDL 38 (L) 04/21/2020 1209   CHOLHDL 3.7 04/21/2020 1209   LDLCALC 71 04/21/2020 1209    Physical Exam:    VS:  BP 122/72   Pulse 83   Ht '5\' 3"'  (1.6 m)   Wt 208 lb 9.6 oz (94.6 kg)   SpO2 98%   BMI 36.95 kg/m     Wt Readings from Last 3 Encounters:  12/15/20 208 lb 9.6 oz (94.6 kg)  12/14/20 204 lb (92.5 kg)  12/10/20 210 lb 9.6 oz (95.5 kg)     GEN: Well nourished, well developed in no acute distress HEENT: Normal NECK: No JVD; No carotid bruits LYMPHATICS: No lymphadenopathy CARDIAC: S1S2 noted,RRR, no murmurs, rubs, gallops RESPIRATORY:  Clear to auscultation without rales, wheezing or rhonchi  ABDOMEN: Soft, non-tender, non-distended, +bowel sounds, no guarding. EXTREMITIES: No edema, No cyanosis, no clubbing MUSCULOSKELETAL:  No deformity  SKIN: Warm and dry NEUROLOGIC:  Alert and oriented x 3, non-focal PSYCHIATRIC:  Normal affect, good insight  ASSESSMENT:    1. Primary hypertension   2. Coronary artery disease involving native coronary artery of native heart, unspecified whether angina present   3. OSA  (obstructive sleep apnea)   4. Mixed hyperlipidemia   5. Obesity (BMI 30-39.9)    PLAN:    She is experiencing some intermittent stable angina symptoms we will like to do is add Imdur 30 mg daily to her current dose of Ranexa 1000 mg twice a day.  She will remain on dual antiplatelet therapy with aspirin and Plavix.  As well as continuing the patient on atorvastatin 40 mg daily.  The plan is to optimize her antianginals and if this does not improve her symptoms then we can pursue a left heart catheterization to reinvestigate her LAD which had 60% stenosis last year.  Get a repeat echocardiogram giving her shortness of breath as well.  Encouraged the patient to continue taking her CPAP  Hyperlipidemia - continue with current statin medication.  We will get lipid profile whenever she is fasting she will come by the office.  The patient understands the need to lose weight with diet and exercise. We have discussed specific strategies for this.  The patient is in agreement with the above plan. The patient left the office in stable condition.  The patient will follow up in 6 weeks   Medication Adjustments/Labs and Tests Ordered: Current medicines are reviewed at length with the patient today.  Concerns regarding medicines are outlined above.  No orders of the defined types were placed in this encounter.  No orders of the defined types were placed in this encounter.   Patient Instructions  Medication Instructions:  Your physician has recommended you make the following change in your medication: START: Imdur 30 mg once daily *If you need a refill on your cardiac medications before your next appointment, please call your pharmacy*   Lab Work: Your physician recommends that you return for lab work: Later this week: Lipids If you have labs (blood work) drawn today and your tests are completely normal, you will receive your results only by: Marland Kitchen MyChart  Message (if you have MyChart) OR . A  paper copy in the mail If you have any lab test that is abnormal or we need to change your treatment, we will call you to review the results.   Testing/Procedures: Your physician has requested that you have an echocardiogram. Echocardiography is a painless test that uses sound waves to create images of your heart. It provides your doctor with information about the size and shape of your heart and how well your heart's chambers and valves are working. This procedure takes approximately one hour. There are no restrictions for this procedure.   Follow-Up: At [facility], you and your health needs are our priority.  As part of our continuing mission to provide you with exceptional heart care, we have created designated Provider Care Teams.  These Care Teams include your primary Cardiologist (physician) and Advanced Practice Providers (APPs -  Physician Assistants and Nurse Practitioners) who all work together to provide you with the care you need, when you need it.  We recommend signing up for the patient portal called "MyChart".  Sign up information is provided on this After Visit Summary.  MyChart is used to connect with patients for Virtual Visits (Telemedicine).  Patients are able to view lab/test results, encounter notes, upcoming appointments, etc.  Non-urgent messages can be sent to your provider as well.   To learn more about what you can do with MyChart, go to [email or web address].    Your next appointment:   6 week(s)  The format for your next appointment:   In Person  Provider:   Berniece Salines, DO   Other Instructions      Adopting a Healthy Lifestyle.  Know what a healthy weight is for you (roughly BMI <25) and aim to maintain this   Aim for 7+ servings of fruits and vegetables daily   65-80+ fluid ounces of water or unsweet tea for healthy kidneys   Limit to max 1 drink of alcohol per day; avoid smoking/tobacco   Limit animal fats in diet for cholesterol and heart  health - choose grass fed whenever available   Avoid highly processed foods, and foods high in saturated/trans fats   Aim for low stress - take time to unwind and care for your mental health   Aim for 150 min of moderate intensity exercise weekly for heart health, and weights twice weekly for bone health   Aim for 7-9 hours of sleep daily   When it comes to diets, agreement about the perfect plan isnt easy to find, even among the experts. Experts at the [employer] developed an idea known as the Healthy Eating Plate. Just imagine a plate divided into logical, healthy portions.   The emphasis is on diet quality:   Load up on vegetables and fruits - one-half of your plate: Aim for color and variety, and remember that potatoes dont count.   Go for whole grains - one-quarter of your plate: Whole wheat, barley, wheat berries, quinoa, oats, brown rice, and foods made with them. If you want pasta, go with whole wheat pasta.   Protein power - one-quarter of your plate: Fish, chicken, beans, and nuts are all healthy, versatile protein sources. Limit red meat.   The diet, however, does go beyond the plate, offering a few other suggestions.   Use healthy plant oils, such as olive, canola, soy, corn, sunflower and peanut. Check the labels, and avoid partially hydrogenated oil, which have unhealthy trans fats.  If youre thirsty, drink water. Coffee and tea are good in moderation, but skip sugary drinks and limit milk and dairy products to one or two daily servings.   The type of carbohydrate in the diet is more important than the amount. Some sources of carbohydrates, such as vegetables, fruits, whole grains, and beans-are healthier than others.   Finally, stay active  Signed, Berniece Salines, DO  12/15/2020 11:42 AM    [place]

## 2020-12-15 NOTE — Patient Instructions (Addendum)
Medication Instructions:  Your physician has recommended you make the following change in your medication: START: Imdur 30 mg once daily *If you need a refill on your cardiac medications before your next appointment, please call your pharmacy*   Lab Work: Your physician recommends that you return for lab work: Later this week: Lipids If you have labs (blood work) drawn today and your tests are completely normal, you will receive your results only by: Marland Kitchen MyChart Message (if you have MyChart) OR . A paper copy in the mail If you have any lab test that is abnormal or we need to change your treatment, we will call you to review the results.   Testing/Procedures: Your physician has requested that you have an echocardiogram. Echocardiography is a painless test that uses sound waves to create images of your heart. It provides your doctor with information about the size and shape of your heart and how well your heart's chambers and valves are working. This procedure takes approximately one hour. There are no restrictions for this procedure.   Follow-Up: At [facility], you and your health needs are our priority.  As part of our continuing mission to provide you with exceptional heart care, we have created designated Provider Care Teams.  These Care Teams include your primary Cardiologist (physician) and Advanced Practice Providers (APPs -  Physician Assistants and Nurse Practitioners) who all work together to provide you with the care you need, when you need it.  We recommend signing up for the patient portal called "MyChart".  Sign up information is provided on this After Visit Summary.  MyChart is used to connect with patients for Virtual Visits (Telemedicine).  Patients are able to view lab/test results, encounter notes, upcoming appointments, etc.  Non-urgent messages can be sent to your provider as well.   To learn more about what you can do with MyChart, go to [email or web address].     Your next appointment:   6 week(s)  The format for your next appointment:   In Person  Provider:   Thomasene Ripple, DO   Other Instructions  Echocardiogram An echocardiogram is a test that uses sound waves (ultrasound) to produce images of the heart. Images from an echocardiogram can provide important information about:  Heart size and shape.  The size and thickness and movement of your heart's walls.  Heart muscle function and strength.  Heart valve function or if you have stenosis. Stenosis is when the heart valves are too narrow.  If blood is flowing backward through the heart valves (regurgitation).  A tumor or infectious growth around the heart valves.  Areas of heart muscle that are not working well because of poor blood flow or injury from a heart attack.  Aneurysm detection. An aneurysm is a weak or damaged part of an artery wall. The wall bulges out from the normal force of blood pumping through the body. Tell a health care provider about:  Any allergies you have.  All medicines you are taking, including vitamins, herbs, eye drops, creams, and over-the-counter medicines.  Any blood disorders you have.  Any surgeries you have had.  Any medical conditions you have.  Whether you are pregnant or may be pregnant. What are the risks? Generally, this is a safe test. However, problems may occur, including an allergic reaction to dye (contrast) that may be used during the test. What happens before the test? No specific preparation is needed. You may eat and drink normally. What happens during the test?  You will  take off your clothes from the waist up and put on a hospital gown.  Electrodes or electrocardiogram (ECG)patches may be placed on your chest. The electrodes or patches are then connected to a device that monitors your heart rate and rhythm.  You will lie down on a table for an ultrasound exam. A gel will be applied to your chest to help sound waves pass  through your skin.  A handheld device, called a transducer, will be pressed against your chest and moved over your heart. The transducer produces sound waves that travel to your heart and bounce back (or "echo" back) to the transducer. These sound waves will be captured in real-time and changed into images of your heart that can be viewed on a video monitor. The images will be recorded on a computer and reviewed by your health care provider.  You may be asked to change positions or hold your breath for a short time. This makes it easier to get different views or better views of your heart.  In some cases, you may receive contrast through an IV in one of your veins. This can improve the quality of the pictures from your heart. The procedure may vary among health care providers and hospitals.   What can I expect after the test? You may return to your normal, everyday life, including diet, activities, and medicines, unless your health care provider tells you not to do that. Follow these instructions at home:  It is up to you to get the results of your test. Ask your health care provider, or the department that is doing the test, when your results will be ready.  Keep all follow-up visits. This is important. Summary  An echocardiogram is a test that uses sound waves (ultrasound) to produce images of the heart.  Images from an echocardiogram can provide important information about the size and shape of your heart, heart muscle function, heart valve function, and other possible heart problems.  You do not need to do anything to prepare before this test. You may eat and drink normally.  After the echocardiogram is completed, you may return to your normal, everyday life, unless your health care provider tells you not to do that. This information is not intended to replace advice given to you by your health care provider. Make sure you discuss any questions you have with your health care  provider. Document Revised: 03/10/2020 Document Reviewed: 03/10/2020 Elsevier Patient Education  2021 [employer].

## 2020-12-15 NOTE — Telephone Encounter (Signed)
MEDICATION:  New RX for [employer] Lancets 30g/0.37mm   AND Test Strips to go with OneTouch Verio Meter  PHARMACY:    [id number] [place], [place] - [address] AT [address] Phone:  [phone]  Fax:  [phone]      HAS THE PATIENT CONTACTED THEIR PHARMACY?  No-new RX's for new Meter  IS THIS A 90 DAY SUPPLY : Yes  IS PATIENT OUT OF MEDICATION: No  IF NOT; HOW MUCH IS LEFT: Approx. 10 Lancets & 28 test strips  LAST APPOINTMENT DATE: @5 /07/2021  NEXT APPOINTMENT DATE:@9 /07/2021  DO WE HAVE YOUR PERMISSION TO LEAVE A DETAILED MESSAGE?: Yes  OTHER COMMENTS:    **Let patient know to contact pharmacy at the end of the day to make sure medication is ready. **  ** Please notify patient to allow 48-72 hours to process**  **Encourage patient to contact the pharmacy for refills or they can request refills through [facility]**

## 2020-12-16 ENCOUNTER — Other Ambulatory Visit: Payer: Self-pay | Admitting: Endocrinology

## 2020-12-16 ENCOUNTER — Other Ambulatory Visit: Payer: Self-pay

## 2020-12-16 DIAGNOSIS — G4733 Obstructive sleep apnea (adult) (pediatric): Secondary | ICD-10-CM | POA: Diagnosis not present

## 2020-12-16 DIAGNOSIS — Z794 Long term (current) use of insulin: Secondary | ICD-10-CM

## 2020-12-16 DIAGNOSIS — E1169 Type 2 diabetes mellitus with other specified complication: Secondary | ICD-10-CM

## 2020-12-16 DIAGNOSIS — E782 Mixed hyperlipidemia: Secondary | ICD-10-CM | POA: Diagnosis not present

## 2020-12-16 LAB — LIPID PANEL
Chol/HDL Ratio: 3.3 ratio (ref 0.0–4.4)
Cholesterol, Total: 134 mg/dL (ref 100–199)
HDL: 41 mg/dL (ref >39)
LDL Chol Calc (NIH): 68 mg/dL (ref 0–99)
Triglycerides: 142 mg/dL (ref 0–149)
VLDL Cholesterol Cal: 25 mg/dL (ref 5–40)

## 2020-12-16 MED ORDER — ONETOUCH DELICA LANCETS 30G MISC: 3 refills | Status: DC

## 2020-12-16 MED ORDER — ONETOUCH VERIO VI STRP: ORAL_STRIP | 12 refills | Status: DC

## 2020-12-17 ENCOUNTER — Telehealth: Payer: Self-pay | Admitting: Cardiology

## 2020-12-17 MED ORDER — RANOLAZINE ER 1000 MG PO TB12: 1000.0000 mg | ORAL_TABLET | Freq: Two times a day (BID) | ORAL | 3 refills | Status: DC

## 2020-12-17 NOTE — Telephone Encounter (Signed)
*  STAT* If patient is at the pharmacy, call can be transferred to refill team.   1. Which medications need to be refilled? (please list name of each medication and dose if known) ranolazine 1000 mg  2. Which pharmacy/location (including street and city if local pharmacy) is medication to be sent to? [facility] [address]  3. Do they need a 30 day or 90 day supply? 90   Patient almost out 2 days

## 2020-12-17 NOTE — Telephone Encounter (Signed)
Refill sent in per request.  

## 2020-12-18 ENCOUNTER — Telehealth: Payer: Self-pay | Admitting: Endocrinology

## 2020-12-18 NOTE — Telephone Encounter (Signed)
[employer]-Jessa is calling regarding Needing a PA for   glucose blood (ONETOUCH VERIO) test strip  Jessa would like a call back

## 2020-12-23 ENCOUNTER — Other Ambulatory Visit: Payer: Self-pay

## 2020-12-23 ENCOUNTER — Telehealth: Payer: Self-pay | Admitting: Cardiology

## 2020-12-23 DIAGNOSIS — E1122 Type 2 diabetes mellitus with diabetic chronic kidney disease: Secondary | ICD-10-CM

## 2020-12-23 DIAGNOSIS — N1831 Chronic kidney disease, stage 3a: Secondary | ICD-10-CM

## 2020-12-23 MED ORDER — ACCU-CHEK GUIDE ME W/DEVICE KIT: 1.0000 | PACK | Freq: Two times a day (BID) | 0 refills | Status: AC

## 2020-12-23 MED ORDER — ACCU-CHEK SOFTCLIX LANCETS MISC: 12 refills | Status: DC

## 2020-12-23 MED ORDER — ACCU-CHEK GUIDE VI STRP: ORAL_STRIP | 12 refills | Status: DC

## 2020-12-23 NOTE — Telephone Encounter (Signed)
Spoke with patient about stopping Imdur per Dr. Mallory Shirk orders. Patient verbalizes understanding. No further questions or concerns at this time.

## 2020-12-23 NOTE — Telephone Encounter (Signed)
Pt c/o medication issue:  1. Name of Medication:  isosorbide mononitrate (IMDUR) 30 MG 24 hr tablet  2. How are you currently taking this medication (dosage and times per day)? 1 tablet (30 mg total) by mouth daily  3. Are you having a reaction (difficulty breathing--STAT)?  No   4. What is your medication issue?  Patient states she has been dizzy and she assumes this medication is the cause. She states she is not dizzy this morning because she hasn't taken the medication.  STAT if patient feels like he/she is going to faint   1) Are you dizzy now?  Not currently  2) Do you feel faint or have you passed out?  No, but patient states whenever the dizziness occurs, she lays down until it subsides  3) Do you have any other symptoms?  No   4) Have you checked your HR and BP (record if available)?  84/63 71/58  75/57  ?

## 2020-12-23 NOTE — Telephone Encounter (Signed)
Spoke with [employer] and the onetouch vderio was denied so I was instructed to send in a non-formulary meter along with supplies. Options were  Accu-chek Viva Accu-chek Guide Truemetric  I have sent in Accu-check Guide meter along with supplies

## 2020-12-24 ENCOUNTER — Other Ambulatory Visit: Payer: Self-pay | Admitting: Physician Assistant

## 2020-12-24 ENCOUNTER — Other Ambulatory Visit: Payer: Self-pay

## 2020-12-24 DIAGNOSIS — N1831 Chronic kidney disease, stage 3a: Secondary | ICD-10-CM

## 2020-12-24 MED ORDER — ACCU-CHEK GUIDE VI STRP: ORAL_STRIP | 12 refills | Status: DC

## 2020-12-24 NOTE — Telephone Encounter (Signed)
Refill sent to pharmacy.   

## 2020-12-25 ENCOUNTER — Telehealth: Payer: Self-pay

## 2020-12-25 NOTE — Telephone Encounter (Signed)
Spoke with patient about her lab results. Patient verbalizes understanding. No further questions or concerns at this time.   

## 2020-12-26 ENCOUNTER — Other Ambulatory Visit (INDEPENDENT_AMBULATORY_CARE_PROVIDER_SITE_OTHER): Payer: Self-pay | Admitting: Family Medicine

## 2020-12-26 DIAGNOSIS — E1169 Type 2 diabetes mellitus with other specified complication: Secondary | ICD-10-CM

## 2020-12-30 ENCOUNTER — Encounter (INDEPENDENT_AMBULATORY_CARE_PROVIDER_SITE_OTHER): Payer: Self-pay | Admitting: Family Medicine

## 2020-12-30 ENCOUNTER — Ambulatory Visit (INDEPENDENT_AMBULATORY_CARE_PROVIDER_SITE_OTHER): Payer: Medicare PPO | Admitting: Family Medicine

## 2020-12-30 ENCOUNTER — Other Ambulatory Visit: Payer: Self-pay

## 2020-12-30 VITALS — BP 103/68 | HR 83 | Temp 98.2°F | Ht 63.0 in | Wt 199.0 lb

## 2020-12-30 DIAGNOSIS — R5383 Other fatigue: Secondary | ICD-10-CM | POA: Diagnosis not present

## 2020-12-30 DIAGNOSIS — I25118 Atherosclerotic heart disease of native coronary artery with other forms of angina pectoris: Secondary | ICD-10-CM

## 2020-12-30 DIAGNOSIS — E1169 Type 2 diabetes mellitus with other specified complication: Secondary | ICD-10-CM

## 2020-12-30 DIAGNOSIS — Z6841 Body Mass Index (BMI) 40.0 and over, adult: Secondary | ICD-10-CM | POA: Diagnosis not present

## 2020-12-30 DIAGNOSIS — Z794 Long term (current) use of insulin: Secondary | ICD-10-CM | POA: Diagnosis not present

## 2020-12-30 DIAGNOSIS — E66813 Obesity, class 3: Secondary | ICD-10-CM

## 2020-12-30 DIAGNOSIS — R5381 Other malaise: Secondary | ICD-10-CM

## 2020-12-30 NOTE — Progress Notes (Signed)
Chief Complaint:   OBESITY Pamela Love is here to discuss her progress with her obesity treatment plan along with follow-up of her obesity related diagnoses. See Medical Weight Management Flowsheet for bioelectrical impedance results.  Today's visit was #: 20 Starting weight: 277 lbs Starting date: 03/17/2020 Today's weight: 199 lbs Today's date: 12/30/2020 Weight change since last visit: -5 lbs Total lbs lost to date: 78 lbs Body mass index is 35.25 kg/m.  Total weight loss percentage to date: -28.16%  Interim History:   Fasting blood sugars: 113, 123, 111, 90, 120. Tolerating Ozempic dose change.  + Angina. Reviewed visit with Cardiology 12/15/20. Prescribed Imdur. BP dropped so patient discontinued after alerting office. Monitoring BP, still low but improving. Staying hydrated. Feels that she is getting enough calories and protein. Two episodes of chest pain in the past 2 weeks, and Pamela Love reports that it feels slightly different than her past episodes - "I can feel my heart beating more." Denies tachycardia or irregular rhythm. Plan is for ECHO next week. Catheterization considered if IV dye available.   Nutrition Plan: the Category 2 Plan for 70% of the time. Activity: No exercise has been prescribed at this time.  Anti-obesity medications: Ozempic. Reported side effects: None.  Assessment/Plan:   1. Type 2 diabetes mellitus with other specified complication, with long-term current use of insulin (HCC) Diabetes Mellitus: Not at goal. Medication: Toujeo and Ozempic. Issues reviewed: blood sugar goals, complications of diabetes mellitus, hypoglycemia prevention and treatment, exercise, and nutrition. Plan: We will recheck labs in 3 months if not done with Endocrinology or PCP.   Lab Results  Component Value Date   HGBA1C 7.0 (A) 12/10/2020   HGBA1C 6.3 (A) 09/02/2020   HGBA1C 6.5 (A) 05/27/2020   Lab Results  Component Value Date   LDLCALC 68 12/16/2020   CREATININE 1.17 (H)  05/12/2020    2. Coronary artery disease involving native coronary artery of native heart with other form of angina pectoris (HCC) Follow up with cardiology and PCP. Reviewed red flags to report to the ED.  3. Malaise and fatigue Related to hypotension and lack of movement. Will follow along.   4. Obesity, current BMI 35.4  Course: Pamela Love is currently in the action stage of change. As such, her goal is to continue with weight loss efforts.   Nutrition goals: She has agreed to the Category 2 Plan.   Exercise goals: No exercise has been prescribed at this time.  Behavioral modification strategies: increasing lean protein intake, decreasing simple carbohydrates, increasing vegetables and increasing water intake.  Pamela Love has agreed to follow-up with our clinic in 4 weeks. She was informed of the importance of frequent follow-up visits to maximize her success with intensive lifestyle modifications for her multiple health conditions.   Objective:   Blood pressure 103/68, pulse 83, temperature 98.2 F (36.8 C), temperature source Oral, height 5\' 3"  (1.6 m), weight 199 lb (90.3 kg), SpO2 94 %. Body mass index is 35.25 kg/m.  General: Cooperative, alert, well developed, in no acute distress. HEENT: Conjunctivae and lids unremarkable. Cardiovascular: Regular rhythm.  Lungs: Normal work of breathing. Neurologic: No focal deficits.   Lab Results  Component Value Date   CREATININE 1.17 (H) 05/12/2020   BUN 43 (H) 05/12/2020   NA 140 05/12/2020   K 3.6 05/12/2020   CL 100 05/12/2020   CO2 24 05/12/2020   Lab Results  Component Value Date   ALT 25 04/21/2020   AST 25 04/21/2020  ALKPHOS 104 04/21/2020   BILITOT 0.5 04/21/2020   Lab Results  Component Value Date   HGBA1C 7.0 (A) 12/10/2020   HGBA1C 6.3 (A) 09/02/2020   HGBA1C 6.5 (A) 05/27/2020   HGBA1C 7.9 (H) 04/21/2020   HGBA1C 8.0 (A) 02/20/2020   Lab Results  Component Value Date   TSH 1.71 05/30/2018   Lab Results   Component Value Date   CHOL 134 12/16/2020   HDL 41 12/16/2020   LDLCALC 68 12/16/2020   TRIG 142 12/16/2020   CHOLHDL 3.3 12/16/2020   Lab Results  Component Value Date   WBC 7.1 01/07/2020   HGB 13.2 01/07/2020   HCT 39.1 01/07/2020   MCV 90.3 01/07/2020   PLT 225 01/07/2020   Obesity Behavioral Intervention:   Approximately 15 minutes were spent on the discussion below.  ASK: We discussed the diagnosis of obesity with Luana today and Babara agreed to give [place] permission to discuss obesity behavioral modification therapy today.  ASSESS: Pamela Love has the diagnosis of obesity and her BMI today is 35.4. Pamela Love is in the action stage of change.   ADVISE: Pamela Love was educated on the multiple health risks of obesity as well as the benefit of weight loss to improve her health. She was advised of the need for long term treatment and the importance of lifestyle modifications to improve her current health and to decrease her risk of future health problems.  AGREE: Multiple dietary modification options and treatment options were discussed and Pamela Love agreed to follow the recommendations documented in the above note.  ARRANGE: Pamela Love was educated on the importance of frequent visits to treat obesity as outlined per CMS and USPSTF guidelines and agreed to schedule her next follow up appointment today.  Attestation Statements:   Reviewed by clinician on day of visit: allergies, medications, problem list, medical history, surgical history, family history, social history, and previous encounter notes.  Carlos Levering Friedenbach, CMA, am acting as Energy manager for [employer], DO.  I have reviewed the above documentation for accuracy and completeness, and I agree with the above. Helane Rima, DO

## 2020-12-31 ENCOUNTER — Other Ambulatory Visit: Payer: Self-pay

## 2020-12-31 ENCOUNTER — Other Ambulatory Visit: Payer: Self-pay | Admitting: Physician Assistant

## 2020-12-31 ENCOUNTER — Other Ambulatory Visit: Payer: Self-pay | Admitting: Cardiology

## 2020-12-31 DIAGNOSIS — E1122 Type 2 diabetes mellitus with diabetic chronic kidney disease: Secondary | ICD-10-CM

## 2020-12-31 DIAGNOSIS — N1831 Chronic kidney disease, stage 3a: Secondary | ICD-10-CM

## 2020-12-31 MED ORDER — ACCU-CHEK SOFTCLIX LANCETS MISC: 12 refills | Status: DC

## 2021-01-01 ENCOUNTER — Other Ambulatory Visit: Payer: Self-pay | Admitting: Endocrinology

## 2021-01-01 DIAGNOSIS — Z794 Long term (current) use of insulin: Secondary | ICD-10-CM

## 2021-01-01 DIAGNOSIS — E1122 Type 2 diabetes mellitus with diabetic chronic kidney disease: Secondary | ICD-10-CM

## 2021-01-01 DIAGNOSIS — N1831 Chronic kidney disease, stage 3a: Secondary | ICD-10-CM

## 2021-01-02 ENCOUNTER — Other Ambulatory Visit: Payer: Self-pay | Admitting: Cardiology

## 2021-01-03 ENCOUNTER — Other Ambulatory Visit: Payer: Self-pay | Admitting: Physician Assistant

## 2021-01-04 DIAGNOSIS — K219 Gastro-esophageal reflux disease without esophagitis: Secondary | ICD-10-CM | POA: Diagnosis not present

## 2021-01-04 DIAGNOSIS — I209 Angina pectoris, unspecified: Secondary | ICD-10-CM | POA: Diagnosis not present

## 2021-01-04 DIAGNOSIS — I959 Hypotension, unspecified: Secondary | ICD-10-CM | POA: Diagnosis not present

## 2021-01-06 ENCOUNTER — Other Ambulatory Visit: Payer: Self-pay | Admitting: Cardiology

## 2021-01-07 ENCOUNTER — Telehealth: Payer: Self-pay | Admitting: Cardiology

## 2021-01-07 ENCOUNTER — Ambulatory Visit (INDEPENDENT_AMBULATORY_CARE_PROVIDER_SITE_OTHER): Payer: Medicare PPO

## 2021-01-07 ENCOUNTER — Other Ambulatory Visit: Payer: Self-pay

## 2021-01-07 DIAGNOSIS — R0602 Shortness of breath: Secondary | ICD-10-CM | POA: Diagnosis not present

## 2021-01-07 LAB — ECHOCARDIOGRAM COMPLETE
Area-P 1/2: 5.2 cm2
S' Lateral: 2.7 cm

## 2021-01-07 NOTE — Telephone Encounter (Signed)
*  STAT* If patient is at the pharmacy, call can be transferred to refill team.   1. Which medications need to be refilled? (please list name of each medication and dose if known)  furosemide (LASIX) 40 MG tablet KLOR-CON M20 20 MEQ tablet 2. Which pharmacy/location (including street and city if local pharmacy) is medication to be sent to? [id number] [place], [place] - [address]  3. Do they need a 30 day or 90 day supply? 90 day supply   The patient it out of this medication

## 2021-01-07 NOTE — Progress Notes (Signed)
Complete echocardiogram performed.  Jimmy Annsley Akkerman RDCS, RVT  

## 2021-01-07 NOTE — Telephone Encounter (Signed)
Request already completed at 8:20 this morning.

## 2021-01-12 ENCOUNTER — Encounter (INDEPENDENT_AMBULATORY_CARE_PROVIDER_SITE_OTHER): Payer: Self-pay | Admitting: Adult Health

## 2021-01-12 ENCOUNTER — Other Ambulatory Visit: Payer: Self-pay

## 2021-01-12 ENCOUNTER — Ambulatory Visit (INDEPENDENT_AMBULATORY_CARE_PROVIDER_SITE_OTHER): Payer: Medicare PPO | Admitting: Adult Health

## 2021-01-12 VITALS — BP 129/74 | HR 65 | Temp 97.6°F | Ht 63.0 in | Wt 203.0 lb

## 2021-01-12 DIAGNOSIS — Z794 Long term (current) use of insulin: Secondary | ICD-10-CM | POA: Diagnosis not present

## 2021-01-12 DIAGNOSIS — E669 Obesity, unspecified: Secondary | ICD-10-CM

## 2021-01-12 DIAGNOSIS — Z6841 Body Mass Index (BMI) 40.0 and over, adult: Secondary | ICD-10-CM | POA: Diagnosis not present

## 2021-01-12 DIAGNOSIS — E1169 Type 2 diabetes mellitus with other specified complication: Secondary | ICD-10-CM

## 2021-01-12 DIAGNOSIS — E1159 Type 2 diabetes mellitus with other circulatory complications: Secondary | ICD-10-CM

## 2021-01-12 DIAGNOSIS — I152 Hypertension secondary to endocrine disorders: Secondary | ICD-10-CM

## 2021-01-12 MED ORDER — OZEMPIC (2 MG/DOSE) 8 MG/3ML ~~LOC~~ SOPN: 2.0000 mg | PEN_INJECTOR | SUBCUTANEOUS | 0 refills | Status: DC

## 2021-01-14 NOTE — Progress Notes (Signed)
Chief Complaint:   OBESITY Pamela Love is here to discuss her progress with her obesity treatment plan along with follow-up of her obesity related diagnoses. Pamela Love is on the Category 2 Plan and states she is following her eating plan approximately 80% of the time. Pamela Love states she is not currently exercising.  Today's visit was #: 21 Starting weight: 277 lbs Starting date: 03/17/2020 Today's weight: 203 lbs Today's date: 01/12/2021 Total lbs lost to date: 74 Total lbs lost since last in-office visit: 0  Interim History: Pamela Love increased Ozempic back to 2 mg once weekly due to 4 lb weight gain. Pt denies mass in neck, dysphagia, dyspepsia, persistent hoarseness, or GI upset. Of Note: She experienced hypotension after starting Imdur. PCP discontinued valsartan/HCT and cardiology discontinued Imdur. The only BP medication she is currently on is Coreg 3.125 mg BID.  Subjective:   1. Type 2 diabetes mellitus with other specified complication, with long-term current use of insulin (HCC) Pamela Love has been on Semaglutide 1.5 mg- 2 mg once weekly. She experienced low appetite with 2 mg, so decreased to 1.5 mg. She is up 4 lbs today, so increased Semaglutide to 2 mg- wants to stay on 2 mg. She is also on Toujeo 46 U QD. Ambulatory fasting BG 90-120.  Lab Results  Component Value Date   HGBA1C 7.0 (A) 12/10/2020   HGBA1C 6.3 (A) 09/02/2020   HGBA1C 6.5 (A) 05/27/2020   Lab Results  Component Value Date   LDLCALC 68 12/16/2020   CREATININE 1.17 (H) 05/12/2020   No results found for: INSULIN  2. Hypertension associated with type 2 diabetes mellitus (HCC) BP/HR excellent at OV.  She experienced hypotension after starting Imdur.  Cardiology discontinued Imdur.  PCP discontinued valsartan/HCT. The only BP medication she is currently on is Coreg 3.125 mg BID.  BP Readings from Last 3 Encounters:  01/12/21 129/74  12/30/20 103/68  12/15/20 122/72   Assessment/Plan:   1. Type 2 diabetes  mellitus with other specified complication, with long-term current use of insulin (HCC) Good blood sugar control is important to decrease the likelihood of diabetic complications such as nephropathy, neuropathy, limb loss, blindness, coronary artery disease, and death. Intensive lifestyle modification including diet, exercise and weight loss are the first line of treatment for diabetes.   - Semaglutide, 2 MG/DOSE, (OZEMPIC, 2 MG/DOSE,) 8 MG/3ML SOPN; Inject 2 mg into the skin once a week.  Dispense: 3 mL; Refill: 0  2. Hypertension associated with type 2 diabetes mellitus (HCC) Pamela Love is working on healthy weight loss and exercise to improve blood pressure control. We will watch for signs of hypotension as she continues her lifestyle modifications. -Continue current anti-hypertensive regimen per cardiology and PCP.  3. Obesity, current BMI 36.0  Pamela Love is currently in the action stage of change. As such, her goal is to continue with weight loss efforts. She has agreed to the Category 2 Plan.   Exercise goals:  Activity as tolerated.  Behavioral modification strategies: increasing lean protein intake, decreasing simple carbohydrates, increasing water intake, keeping healthy foods in the home, ways to avoid boredom eating, and planning for success.  Pamela Love has agreed to follow-up with our clinic in 2 weeks. She was informed of the importance of frequent follow-up visits to maximize her success with intensive lifestyle modifications for her multiple health conditions.   Objective:   Blood pressure 129/74, pulse 65, temperature 97.6 F (36.4 C), height 5\' 3"  (1.6 m), weight 203 lb (92.1 kg), SpO2 93 %.  Body mass index is 35.96 kg/m.  General: Cooperative, alert, well developed, in no acute distress. HEENT: Conjunctivae and lids unremarkable. Cardiovascular: Regular rhythm.  Lungs: Normal work of breathing. Neurologic: No focal deficits.   Lab Results  Component Value Date   CREATININE 1.17  (H) 05/12/2020   BUN 43 (H) 05/12/2020   NA 140 05/12/2020   K 3.6 05/12/2020   CL 100 05/12/2020   CO2 24 05/12/2020   Lab Results  Component Value Date   ALT 25 04/21/2020   AST 25 04/21/2020   ALKPHOS 104 04/21/2020   BILITOT 0.5 04/21/2020   Lab Results  Component Value Date   HGBA1C 7.0 (A) 12/10/2020   HGBA1C 6.3 (A) 09/02/2020   HGBA1C 6.5 (A) 05/27/2020   HGBA1C 7.9 (H) 04/21/2020   HGBA1C 8.0 (A) 02/20/2020   No results found for: INSULIN Lab Results  Component Value Date   TSH 1.71 05/30/2018   Lab Results  Component Value Date   CHOL 134 12/16/2020   HDL 41 12/16/2020   LDLCALC 68 12/16/2020   TRIG 142 12/16/2020   CHOLHDL 3.3 12/16/2020   Lab Results  Component Value Date   WBC 7.1 01/07/2020   HGB 13.2 01/07/2020   HCT 39.1 01/07/2020   MCV 90.3 01/07/2020   PLT 225 01/07/2020   No results found for: IRON, TIBC, FERRITIN   Attestation Statements:   Reviewed by clinician on day of visit: allergies, medications, problem list, medical history, surgical history, family history, social history, and previous encounter notes.  Time spent on visit including pre-visit chart review and post-visit care and charting was 30 minutes.   Edmund Hilda, CMA, am acting as transcriptionist for William Hamburger, NP.  I have reviewed the above documentation for accuracy and completeness, and I agree with the above. -  Sayra Frisby d. Wenceslao Loper, NP-C

## 2021-01-16 DIAGNOSIS — G4733 Obstructive sleep apnea (adult) (pediatric): Secondary | ICD-10-CM | POA: Diagnosis not present

## 2021-01-22 ENCOUNTER — Other Ambulatory Visit (INDEPENDENT_AMBULATORY_CARE_PROVIDER_SITE_OTHER): Payer: Self-pay | Admitting: Family Medicine

## 2021-01-22 DIAGNOSIS — Z794 Long term (current) use of insulin: Secondary | ICD-10-CM

## 2021-01-22 DIAGNOSIS — E1169 Type 2 diabetes mellitus with other specified complication: Secondary | ICD-10-CM

## 2021-01-25 ENCOUNTER — Encounter (INDEPENDENT_AMBULATORY_CARE_PROVIDER_SITE_OTHER): Payer: Self-pay | Admitting: Physician Assistant

## 2021-01-25 ENCOUNTER — Other Ambulatory Visit: Payer: Self-pay

## 2021-01-25 ENCOUNTER — Ambulatory Visit (INDEPENDENT_AMBULATORY_CARE_PROVIDER_SITE_OTHER): Payer: Medicare PPO | Admitting: Physician Assistant

## 2021-01-25 VITALS — BP 135/78 | HR 80 | Temp 98.2°F | Ht 63.0 in | Wt 201.0 lb

## 2021-01-25 DIAGNOSIS — G4733 Obstructive sleep apnea (adult) (pediatric): Secondary | ICD-10-CM | POA: Diagnosis not present

## 2021-01-25 DIAGNOSIS — Z794 Long term (current) use of insulin: Secondary | ICD-10-CM

## 2021-01-25 DIAGNOSIS — Z6839 Body mass index (BMI) 39.0-39.9, adult: Secondary | ICD-10-CM | POA: Diagnosis not present

## 2021-01-25 DIAGNOSIS — E1169 Type 2 diabetes mellitus with other specified complication: Secondary | ICD-10-CM

## 2021-01-25 NOTE — Telephone Encounter (Signed)
Pt last seen by Katy Danford, FNP.  

## 2021-01-26 ENCOUNTER — Other Ambulatory Visit: Payer: Self-pay | Admitting: Endocrinology

## 2021-01-26 ENCOUNTER — Encounter: Payer: Self-pay | Admitting: Cardiology

## 2021-01-26 ENCOUNTER — Ambulatory Visit: Payer: Medicare PPO | Admitting: Cardiology

## 2021-01-26 VITALS — BP 128/70 | HR 87 | Ht 63.0 in | Wt 202.2 lb

## 2021-01-26 DIAGNOSIS — I10 Essential (primary) hypertension: Secondary | ICD-10-CM | POA: Diagnosis not present

## 2021-01-26 DIAGNOSIS — G4733 Obstructive sleep apnea (adult) (pediatric): Secondary | ICD-10-CM

## 2021-01-26 DIAGNOSIS — N1831 Chronic kidney disease, stage 3a: Secondary | ICD-10-CM

## 2021-01-26 DIAGNOSIS — I152 Hypertension secondary to endocrine disorders: Secondary | ICD-10-CM

## 2021-01-26 DIAGNOSIS — I25110 Atherosclerotic heart disease of native coronary artery with unstable angina pectoris: Secondary | ICD-10-CM

## 2021-01-26 DIAGNOSIS — E1159 Type 2 diabetes mellitus with other circulatory complications: Secondary | ICD-10-CM

## 2021-01-26 NOTE — Patient Instructions (Signed)
Medication Instructions:   Your physician recommends that you continue on your current medications as directed. Please refer to the Current Medication list given to you today.  *If you need a refill on your cardiac medications before your next appointment, please call your pharmacy*   Lab Work: Your physician recommends that you return for lab work in:  TODAY: BMET, Mag, CBC  If you have labs (blood work) drawn today and your tests are completely normal, you will receive your results only by: MyChart Message (if you have MyChart) OR A paper copy in the mail If you have any lab test that is abnormal or we need to change your treatment, we will call you to review the results.   Testing/Procedures:     [facility] AT [place] [address] [place] [address] Dept: [phone] Loc: [phone]  Rosy Estabrook  01/26/2021  You are scheduled for a Cardiac Catheterization on Friday, July 1 with Dr. Lance Muss.  1. Please arrive at the [facility] (Main Entrance A) at [facility]: [address] [place], [place] [address] at 8:30 AM (This time is two hours before your procedure to ensure your preparation). Free valet parking service is available.   Special note: Every effort is made to have your procedure done on time. Please understand that emergencies sometimes delay scheduled procedures.  2. Diet: Do not eat solid foods after midnight.  The patient may have clear liquids until 5am upon the day of the procedure.  3. Labs: You will need to have blood drawn on TODAY.  4. Medication instructions in preparation for your procedure:   Contrast Allergy: No  Take only 1/2 dose of insulin the night before your procedure. Do not take any insulin on the day of the procedure.  On the morning of your procedure, take your Aspirin and any morning medicines NOT listed above.  You may use sips of  water.  5. Plan for one night stay--bring personal belongings. 6. Bring a current list of your medications and current insurance cards. 7. You MUST have a responsible person to drive you home. 8. Someone MUST be with you the first 24 hours after you arrive home or your discharge will be delayed. 9. Please wear clothes that are easy to get on and off and wear slip-on shoes.  Thank you for allowing [place] to care for you!   -- [place] [facility]    Follow-Up: At [facility], you and your health needs are our priority.  As part of our continuing mission to provide you with exceptional heart care, we have created designated Provider Care Teams.  These Care Teams include your primary Cardiologist (physician) and Advanced Practice Providers (APPs -  Physician Assistants and Nurse Practitioners) who all work together to provide you with the care you need, when you need it.  We recommend signing up for the patient portal called "MyChart".  Sign up information is provided on this After Visit Summary.  MyChart is used to connect with patients for Virtual Visits (Telemedicine).  Patients are able to view lab/test results, encounter notes, upcoming appointments, etc.  Non-urgent messages can be sent to your provider as well.   To learn more about what you can do with MyChart, go to [email or web address].    Your next appointment:   4 week(s)  The format for your next appointment:   In Person  Provider:   Thomasene Ripple, DO  Other Instructions  .instang

## 2021-01-26 NOTE — H&P (View-Only) (Signed)
Cardiology Office Note:    Date:  01/26/2021   ID:  Pamela Love, DOB [birth year], MRN [id number]  PCP:  Mayra Neer, MD  Cardiologist:  Berniece Salines, DO  Electrophysiologist:  None   Referring MD: Mayra Neer, MD   No chief complaint on file. Chest pain is getting worse  History of Present Illness:    Pamela Love is a 70 y.o. female with a hx of coronary artery disease status post stent on January 06, 2020 to RCA as a result of a positive stress test, hypertension, diabetes mellitus, hyperlipidemia and sleep apnea.   I  saw the patient in October 2021 at that time she was experiencing some stable angina I added Ranexa 500 mg twice a day to her medical regimen.  She is here today for follow-up visit.  She is happy with the Ranexa and tells me that she had not had any repeated episodes since she was started on this medicine.   I saw the patient on June 15, 2020 at that time we continued her Ranexa, her dual antiplatelet therapy as well as her statin and beta-blockers.  She had just been diagnosed with sleep apnea therefore she had a lot of questions about this.  I saw the patient on Dec 15, 2020 she was experiencing intermittent chest pain I added Imdur 30 mg to her already taking Ranexa 1000 mg twice a day, she tells me she has stopped the Imdur as she cannot tolerate the headaches from this medication.  She is experiencing worsening chest discomfort.  She described it as multiple episodes and talk to me about the most recent 1.  She listed by date said on June 6 she had an episode where she had significant chest discomfort with radiating jaw pain, June 22 similar episode as well as June 27 which was yesterday she had an episode which woke her up from a sleep and had radiation to her jaw.  Nothing makes it better or worse.  At times she has associated shortness of breath.   Past Medical History:  Diagnosis Date   Abnormal nuclear stress test 01/02/2020   Arthritis     Bilateral leg edema 01/02/2020   Bursitis    hips   C. difficile colitis 12/2017   CAD (coronary artery disease) 01/06/2020   CKD (chronic kidney disease), stage III (HCC)    Complication of anesthesia    "spinal did not work with second c section"   Diabetes mellitus ([place]) 11/19/2015   Diabetes mellitus, type 2 ([place])    diet controlled - has Rx for Glymipride but has not started taking yet   Dyslipidemia 10/21/2014   GERD (gastroesophageal reflux disease)    History of nonmelanoma skin cancer    History of skin cancer    Hypercholesterolemia 12/11/2019   Hyperlipidemia    Hypertension    Hypertensive disorder 10/21/2014   Lower extremity edema    Mixed hyperlipidemia 01/02/2020   OAB (overactive bladder)    Obesity (BMI 30-39.9) 05/12/2020   OSA (obstructive sleep apnea) 05/12/2020   Osteoarthritis of right knee 05/15/2015   Osteoarthrosis, unspecified whether generalized or localized, involving lower leg 10/21/2014   Osteoporosis    Over weight    Primary osteoarthritis of left knee 12/18/2014   S/P knee replacement 12/18/2014   S/P total knee replacement using cement 05/15/2015   Sleep apnea    Tachycardia 12/11/2019    Past Surgical History:  Procedure Laterality Date   ABDOMINAL HYSTERECTOMY  2005  CATARACT EXTRACTION     CESAREAN SECTION     x2   colonscopy      CORONARY STENT INTERVENTION N/A 01/06/2020   Procedure: CORONARY STENT INTERVENTION;  Surgeon: Lorretta Harp, MD;  Location: [place] [facility];  Service: Cardiovascular;  Laterality: N/A;   EYE SURGERY     bilat cataract surgery 2015   INTRAVASCULAR PRESSURE WIRE/FFR STUDY N/A 01/06/2020   Procedure: INTRAVASCULAR PRESSURE WIRE/FFR STUDY;  Surgeon: Lorretta Harp, MD;  Location: [place] [facility];  Service: Cardiovascular;  Laterality: N/A;   LEFT HEART CATH AND CORONARY ANGIOGRAPHY N/A 01/06/2020   Procedure: LEFT HEART CATH AND CORONARY ANGIOGRAPHY;  Surgeon: Lorretta Harp, MD;  Location: [place] [facility];  Service: Cardiovascular;  Laterality: N/A;   TOTAL KNEE ARTHROPLASTY Left 12/18/2014   Procedure: TOTAL LEFT KNEE ARTHROPLASTY;  Surgeon: Sydnee Cabal, MD;  Location: [facility];  Service: Orthopedics;  Laterality: Left;   TOTAL KNEE ARTHROPLASTY Right 05/15/2015   Procedure: RIGHT TOTAL KNEE ARTHROPLASTY;  Surgeon: Sydnee Cabal, MD;  Location: [facility];  Service: Orthopedics;  Laterality: Right;   UTERINE FIBROID SURGERY      Current Medications: Current Meds  Medication Sig   Accu-Chek Softclix Lancets lancets USE TO CHECK BLOOD SUGAR TWICE DAILY   acetaminophen (TYLENOL) 650 MG CR tablet Take 1,300 mg by mouth at bedtime.   aspirin EC 81 MG tablet Take 1 tablet (81 mg total) by mouth daily.   Blood Glucose Monitoring Suppl (ACCU-CHEK GUIDE ME) w/Device KIT 1 each by Does not apply route 2 (two) times daily. E11.9   carvedilol (COREG) 3.125 MG tablet TAKE 1 TABLET (3.125 MG TOTAL) BY MOUTH 2 (TWO) TIMES DAILY WITH A MEAL.   Cholecalciferol (VITAMIN D) 2000 UNITS CAPS Take 2,000 Units by mouth every morning.    clopidogrel (PLAVIX) 75 MG tablet TAKE 1 TABLET (75 MG TOTAL) BY MOUTH DAILY WITH BREAKFAST.   Coenzyme Q10 (COQ10) 200 MG CAPS Take 200 mg by mouth daily.    diclofenac sodium (VOLTAREN) 1 % GEL Apply 1 application topically at bedtime as needed (knee pain.).    DULoxetine (CYMBALTA) 20 MG capsule Take 40 mg by mouth at bedtime.    furosemide (LASIX) 40 MG tablet Take 1 tablet (40 mg total) by mouth 3 (three) times a week.   glucose blood (ACCU-CHEK GUIDE) test strip Use to check BS 2x a day   insulin glargine, 2 Unit Dial, (TOUJEO MAX SOLOSTAR) 300 UNIT/ML Solostar Pen Inject 46 Units into the skin every morning. And 4 mm pen needles 2/day   Insulin Pen Needle (BD PEN NEEDLE NANO U/F) 32G X 4 MM MISC 1 each by Does not apply route daily.   KLOR-CON M20 20 MEQ tablet TAKE 1 TABLET (20 MEQ TOTAL) BY MOUTH 3 (THREE) TIMES A WEEK.   Multiple Vitamin (MULTIVITAMIN WITH MINERALS)  TABS tablet Take 1 tablet by mouth every morning.    nitroGLYCERIN (NITROSTAT) 0.4 MG SL tablet Place 1 tablet (0.4 mg total) under the tongue every 5 (five) minutes as needed for chest pain.   Omega-3 Fatty Acids (RA FISH OIL) 1400 MG CPDR Take 1,400 mg by mouth 2 (two) times daily.   ranolazine (RANEXA) 1000 MG SR tablet Take 1 tablet (1,000 mg total) by mouth 2 (two) times daily.   rosuvastatin (CRESTOR) 40 MG tablet TAKE 1 TABLET BY MOUTH EVERY DAY IN THE MORNING   saccharomyces boulardii (FLORASTOR) 250 MG capsule Take 250 mg by mouth  daily.   Semaglutide, 2 MG/DOSE, (OZEMPIC, 2 MG/DOSE,) 8 MG/3ML SOPN Inject 2 mg into the skin once a week.   trospium (SANCTURA) 20 MG tablet Take 20 mg by mouth 2 (two) times daily.   vitamin C (ASCORBIC ACID) 250 MG tablet Take 250 mg by mouth daily.   [DISCONTINUED] valsartan-hydrochlorothiazide (DIOVAN-HCT) 160-12.5 MG tablet Take 0.5 tablets by mouth daily.     Allergies:   Colchicine, Diclofenac, Janumet [sitagliptin-metformin hcl], Septra [sulfamethoxazole-trimethoprim], Trulicity [dulaglutide], Zetia [ezetimibe], Imipramine, and Tape   Social History   Socioeconomic History   Marital status: Married    Spouse name: Not on file   Number of children: Not on file   Years of education: Not on file   Highest education level: Not on file  Occupational History   Occupation: retired  Tobacco Use   Smoking status: Never   Smokeless tobacco: Never  Vaping Use   Vaping Use: Never used  Substance and Sexual Activity   Alcohol use: Yes    Comment: rare   Drug use: No   Sexual activity: Not on file  Other Topics Concern   Not on file  Social History Narrative   Not on file   Social Determinants of Health   Financial Resource Strain: Not on file  Food Insecurity: Not on file  Transportation Needs: Not on file  Physical Activity: Not on file  Stress: Not on file  Social Connections: Not on file     Family History: The patient's family  history includes Cancer in her mother; Congenital heart disease in her son; Diabetes in her maternal grandfather; Heart attack in her father, maternal grandfather, paternal uncle, and sister; Heart disease in her father and mother; Hyperlipidemia in her father and mother; Hypertension in her father and mother; Sleep apnea in her father; Sudden death in her father.  ROS:   Review of Systems  Constitution: Negative for decreased appetite, fever and weight gain.  HENT: Negative for congestion, ear discharge, hoarse voice and sore throat.   Eyes: Negative for discharge, redness, vision loss in right eye and visual halos.  Cardiovascular: Negative for chest pain, dyspnea on exertion, leg swelling, orthopnea and palpitations.  Respiratory: Negative for cough, hemoptysis, shortness of breath and snoring.   Endocrine: Negative for heat intolerance and polyphagia.  Hematologic/Lymphatic: Negative for bleeding problem. Does not bruise/bleed easily.  Skin: Negative for flushing, nail changes, rash and suspicious lesions.  Musculoskeletal: Negative for arthritis, joint pain, muscle cramps, myalgias, neck pain and stiffness.  Gastrointestinal: Negative for abdominal pain, bowel incontinence, diarrhea and excessive appetite.  Genitourinary: Negative for decreased libido, genital sores and incomplete emptying.  Neurological: Negative for brief paralysis, focal weakness, headaches and loss of balance.  Psychiatric/Behavioral: Negative for altered mental status, depression and suicidal ideas.  Allergic/Immunologic: Negative for HIV exposure and persistent infections.    EKGs/Labs/Other Studies Reviewed:    The following studies were reviewed today:   EKG: None today  Echocardiogram done May 2021 IMPRESSIONS   1. Left ventricular ejection fraction, by estimation, is 60 to 65%. The  left ventricle has normal function. The left ventricle has no regional  wall motion abnormalities. Left ventricular  diastolic parameters are  consistent with Grade I diastolic  dysfunction (impaired relaxation).   2. Right ventricular systolic function is normal. The right ventricular  size is normal.   3. The mitral valve is normal in structure. No evidence of mitral valve  regurgitation. No evidence of mitral stenosis.   4. The aortic  valve is tricuspid. Aortic valve regurgitation is not  visualized. Mild aortic valve sclerosis is present, with no evidence of  aortic valve stenosis.   5. The inferior vena cava is normal in size with greater than 50%  respiratory variability, suggesting right atrial pressure of 3 mmHg.   Left heart catheterization June 2021   Prox LAD to Mid LAD lesion is 60% stenosed. Prox RCA to Mid RCA lesion is 90% stenosed. A drug-eluting stent was successfully placed using a [place] [id number]. Post intervention, there is a 0% residual stenosis.        Recent Labs: 04/21/2020: ALT 25 05/12/2020: BUN 43; Creatinine, Ser 1.17; Magnesium 2.0; Potassium 3.6; Sodium 140  Recent Lipid Panel    Component Value Date/Time   CHOL 134 12/16/2020 0957   TRIG 142 12/16/2020 0957   HDL 41 12/16/2020 0957   CHOLHDL 3.3 12/16/2020 0957   LDLCALC 68 12/16/2020 0957    Physical Exam:    VS:  BP 128/70   Pulse 87   Ht _0  (1.6 m)   Wt 202 lb 3.2 oz (91.7 kg)   SpO2 97%   BMI 35.82 kg/m     Wt Readings from Last 3 Encounters:  01/26/21 202 lb 3.2 oz (91.7 kg)  01/25/21 201 lb (91.2 kg)  01/12/21 203 lb (92.1 kg)     GEN: Well nourished, well developed in no acute distress HEENT: Normal NECK: No JVD; No carotid bruits LYMPHATICS: No lymphadenopathy CARDIAC: S1S2 noted,RRR, no murmurs, rubs, gallops RESPIRATORY:  Clear to auscultation without rales, wheezing or rhonchi  ABDOMEN: Soft, non-tender, non-distended, +bowel sounds, no guarding. EXTREMITIES: No edema, No cyanosis, no clubbing MUSCULOSKELETAL:  No deformity  SKIN: Warm and dry NEUROLOGIC:  Alert  and oriented x 3, non-focal PSYCHIATRIC:  Normal affect, good insight  ASSESSMENT:    1. Coronary artery disease involving native coronary artery of native heart with unstable angina pectoris ([place])   2. Hypertension, unspecified type   3. Hypertension associated with type 2 diabetes mellitus ([place])   4. OSA (obstructive sleep apnea)   5. Stage 3a chronic kidney disease (HCC)    PLAN:      With her persistent anginal pain on antianginals and not been able to tolerate Imdur the patient is high risk for progression of coronary artery disease therefore I like to proceed with a left heart catheterization in this patient to understand her coronary anatomy as well as hopefully with her known 60% LAD lesion we will also understand her coronary flow during her heart catheterization.    If she does not require any PCI my plan is to increase her beta-blocker and ultimately add calcium channel blockers.  The patient is in agreement with the above plan. The patient left the office in stable condition.  The patient will follow up in 6 weeks post cath   Medication Adjustments/Labs and Tests Ordered: Current medicines are reviewed at length with the patient today.  Concerns regarding medicines are outlined above.  Orders Placed This Encounter  Procedures   Basic metabolic panel   CBC with Differential/Platelet   Magnesium   No orders of the defined types were placed in this encounter.   Patient Instructions  Medication Instructions:   Your physician recommends that you continue on your current medications as directed. Please refer to the Current Medication list given to you today.  *If you need a refill on your cardiac medications before your next appointment, please call your pharmacy*  Lab Work: Your physician recommends that you return for lab work in:  TODAY: BMET, [place], CBC  If you have labs (blood work) drawn today and your tests are completely normal, you will receive your results  only by: [employer] (if you have [place]) OR A paper copy in the mail If you have any lab test that is abnormal or we need to change your treatment, we will call you to review the results.   Testing/Procedures:     [place] [address] Dept: [phone] Loc: [phone]  Chancey Cullinane  01/26/2021  You are scheduled for a Cardiac Catheterization on Friday, July 1 with Dr. Larae Grooms.  1. Please arrive at the [facility] (Main Entrance A) at [facility]: [address] [place], [place] [address] at 8:30 AM (This time is two hours before your procedure to ensure your preparation). Free valet parking service is available.   Special note: Every effort is made to have your procedure done on time. Please understand that emergencies sometimes delay scheduled procedures.  2. Diet: Do not eat solid foods after midnight.  The patient may have clear liquids until 5am upon the day of the procedure.  3. Labs: You will need to have blood drawn on TODAY.  4. Medication instructions in preparation for your procedure:   Contrast Allergy: No  Take only 1/2 dose of insulin the night before your procedure. Do not take any insulin on the day of the procedure.  On the morning of your procedure, take your Aspirin and any morning medicines NOT listed above.  You may use sips of water.  5. Plan for one night stay--bring personal belongings. 6. Bring a current list of your medications and current insurance cards. 7. You MUST have a responsible person to drive you home. 8. Someone MUST be with you the first 24 hours after you arrive home or your discharge will be delayed. 9. Please wear clothes that are easy to get on and off and wear slip-on shoes.  Thank you for allowing [place] to care for you!   -- [place] [facility]    Follow-Up: At  [facility], you and your health needs are our priority.  As part of our continuing mission to provide you with exceptional heart care, we have created designated Provider Care Teams.  These Care Teams include your primary Cardiologist (physician) and Advanced Practice Providers (APPs -  Physician Assistants and Nurse Practitioners) who all work together to provide you with the care you need, when you need it.  We recommend signing up for the patient portal called "MyChart".  Sign up information is provided on this After Visit Summary.  MyChart is used to connect with patients for Virtual Visits (Telemedicine).  Patients are able to view lab/test results, encounter notes, upcoming appointments, etc.  Non-urgent messages can be sent to your provider as well.   To learn more about what you can do with MyChart, go to [email or web address].    Your next appointment:   4 week(s)  The format for your next appointment:   In Person  Provider:   Berniece Salines, DO   Other Instructions  .instang   Adopting a Healthy Lifestyle.  Know what a healthy weight is for you (roughly BMI <25) and aim to maintain this   Aim for 7+ servings of fruits and vegetables daily   65-80+ fluid ounces of water  or unsweet tea for healthy kidneys   Limit to max 1 drink of alcohol per day; avoid smoking/tobacco   Limit animal fats in diet for cholesterol and heart health - choose grass fed whenever available   Avoid highly processed foods, and foods high in saturated/trans fats   Aim for low stress - take time to unwind and care for your mental health   Aim for 150 min of moderate intensity exercise weekly for heart health, and weights twice weekly for bone health   Aim for 7-9 hours of sleep daily   When it comes to diets, agreement about the perfect plan isnt easy to find, even among the experts. Experts at the [employer] developed an idea known as the Healthy Eating Plate. Just  imagine a plate divided into logical, healthy portions.   The emphasis is on diet quality:   Load up on vegetables and fruits - one-half of your plate: Aim for color and variety, and remember that potatoes dont count.   Go for whole grains - one-quarter of your plate: Whole wheat, barley, wheat berries, quinoa, oats, brown rice, and foods made with them. If you want pasta, go with whole wheat pasta.   Protein power - one-quarter of your plate: Fish, chicken, beans, and nuts are all healthy, versatile protein sources. Limit red meat.   The diet, however, does go beyond the plate, offering a few other suggestions.   Use healthy plant oils, such as olive, canola, soy, corn, sunflower and peanut. Check the labels, and avoid partially hydrogenated oil, which have unhealthy trans fats.   If youre thirsty, drink water. Coffee and tea are good in moderation, but skip sugary drinks and limit milk and dairy products to one or two daily servings.   The type of carbohydrate in the diet is more important than the amount. Some sources of carbohydrates, such as vegetables, fruits, whole grains, and beans-are healthier than others.   Finally, stay active  Signed, Berniece Salines, DO  01/26/2021 2:53 PM    [facility]

## 2021-01-26 NOTE — Progress Notes (Signed)
Cardiology Office Note:    Date:  01/26/2021   ID:  Pamela Love, DOB [birth year], MRN [id number]  PCP:  Mayra Neer, MD  Cardiologist:  Berniece Salines, DO  Electrophysiologist:  None   Referring MD: Mayra Neer, MD   No chief complaint on file. Chest pain is getting worse  History of Present Illness:    Pamela Love is a 70 y.o. female with a hx of coronary artery disease status post stent on January 06, 2020 to RCA as a result of a positive stress test, hypertension, diabetes mellitus, hyperlipidemia and sleep apnea.   I  saw the patient in October 2021 at that time she was experiencing some stable angina I added Ranexa 500 mg twice a day to her medical regimen.  She is here today for follow-up visit.  She is happy with the Ranexa and tells me that she had not had any repeated episodes since she was started on this medicine.   I saw the patient on June 15, 2020 at that time we continued her Ranexa, her dual antiplatelet therapy as well as her statin and beta-blockers.  She had just been diagnosed with sleep apnea therefore she had a lot of questions about this.  I saw the patient on Dec 15, 2020 she was experiencing intermittent chest pain I added Imdur 30 mg to her already taking Ranexa 1000 mg twice a day, she tells me she has stopped the Imdur as she cannot tolerate the headaches from this medication.  She is experiencing worsening chest discomfort.  She described it as multiple episodes and talk to me about the most recent 1.  She listed by date said on June 6 she had an episode where she had significant chest discomfort with radiating jaw pain, June 22 similar episode as well as June 27 which was yesterday she had an episode which woke her up from a sleep and had radiation to her jaw.  Nothing makes it better or worse.  At times she has associated shortness of breath.   Past Medical History:  Diagnosis Date   Abnormal nuclear stress test 01/02/2020   Arthritis     Bilateral leg edema 01/02/2020   Bursitis    hips   C. difficile colitis 12/2017   CAD (coronary artery disease) 01/06/2020   CKD (chronic kidney disease), stage III (HCC)    Complication of anesthesia    "spinal did not work with second c section"   Diabetes mellitus ([place]) 11/19/2015   Diabetes mellitus, type 2 ([place])    diet controlled - has Rx for Glymipride but has not started taking yet   Dyslipidemia 10/21/2014   GERD (gastroesophageal reflux disease)    History of nonmelanoma skin cancer    History of skin cancer    Hypercholesterolemia 12/11/2019   Hyperlipidemia    Hypertension    Hypertensive disorder 10/21/2014   Lower extremity edema    Mixed hyperlipidemia 01/02/2020   OAB (overactive bladder)    Obesity (BMI 30-39.9) 05/12/2020   OSA (obstructive sleep apnea) 05/12/2020   Osteoarthritis of right knee 05/15/2015   Osteoarthrosis, unspecified whether generalized or localized, involving lower leg 10/21/2014   Osteoporosis    Over weight    Primary osteoarthritis of left knee 12/18/2014   S/P knee replacement 12/18/2014   S/P total knee replacement using cement 05/15/2015   Sleep apnea    Tachycardia 12/11/2019    Past Surgical History:  Procedure Laterality Date   ABDOMINAL HYSTERECTOMY  2005  CATARACT EXTRACTION     CESAREAN SECTION     x2   colonscopy      CORONARY STENT INTERVENTION N/A 01/06/2020   Procedure: CORONARY STENT INTERVENTION;  Surgeon: Lorretta Harp, MD;  Location: [facility];  Service: Cardiovascular;  Laterality: N/A;   EYE SURGERY     bilat cataract surgery 2015   INTRAVASCULAR PRESSURE WIRE/FFR STUDY N/A 01/06/2020   Procedure: INTRAVASCULAR PRESSURE WIRE/FFR STUDY;  Surgeon: Lorretta Harp, MD;  Location: [facility];  Service: Cardiovascular;  Laterality: N/A;   LEFT HEART CATH AND CORONARY ANGIOGRAPHY N/A 01/06/2020   Procedure: LEFT HEART CATH AND CORONARY ANGIOGRAPHY;  Surgeon: Lorretta Harp, MD;  Location: [facility];  Service: Cardiovascular;  Laterality: N/A;   TOTAL KNEE ARTHROPLASTY Left 12/18/2014   Procedure: TOTAL LEFT KNEE ARTHROPLASTY;  Surgeon: Sydnee Cabal, MD;  Location: [facility];  Service: Orthopedics;  Laterality: Left;   TOTAL KNEE ARTHROPLASTY Right 05/15/2015   Procedure: RIGHT TOTAL KNEE ARTHROPLASTY;  Surgeon: Sydnee Cabal, MD;  Location: [facility];  Service: Orthopedics;  Laterality: Right;   UTERINE FIBROID SURGERY      Current Medications: Current Meds  Medication Sig   Accu-Chek Softclix Lancets lancets USE TO CHECK BLOOD SUGAR TWICE DAILY   acetaminophen (TYLENOL) 650 MG CR tablet Take 1,300 mg by mouth at bedtime.   aspirin EC 81 MG tablet Take 1 tablet (81 mg total) by mouth daily.   Blood Glucose Monitoring Suppl (ACCU-CHEK GUIDE ME) w/Device KIT 1 each by Does not apply route 2 (two) times daily. E11.9   carvedilol (COREG) 3.125 MG tablet TAKE 1 TABLET (3.125 MG TOTAL) BY MOUTH 2 (TWO) TIMES DAILY WITH A MEAL.   Cholecalciferol (VITAMIN D) 2000 UNITS CAPS Take 2,000 Units by mouth every morning.    clopidogrel (PLAVIX) 75 MG tablet TAKE 1 TABLET (75 MG TOTAL) BY MOUTH DAILY WITH BREAKFAST.   Coenzyme Q10 (COQ10) 200 MG CAPS Take 200 mg by mouth daily.    diclofenac sodium (VOLTAREN) 1 % GEL Apply 1 application topically at bedtime as needed (knee pain.).    DULoxetine (CYMBALTA) 20 MG capsule Take 40 mg by mouth at bedtime.    furosemide (LASIX) 40 MG tablet Take 1 tablet (40 mg total) by mouth 3 (three) times a week.   glucose blood (ACCU-CHEK GUIDE) test strip Use to check BS 2x a day   insulin glargine, 2 Unit Dial, (TOUJEO MAX SOLOSTAR) 300 UNIT/ML Solostar Pen Inject 46 Units into the skin every morning. And 4 mm pen needles 2/day   Insulin Pen Needle (BD PEN NEEDLE NANO U/F) 32G X 4 MM MISC 1 each by Does not apply route daily.   KLOR-CON M20 20 MEQ tablet TAKE 1 TABLET (20 MEQ TOTAL) BY MOUTH 3 (THREE) TIMES A WEEK.   Multiple Vitamin (MULTIVITAMIN WITH MINERALS)  TABS tablet Take 1 tablet by mouth every morning.    nitroGLYCERIN (NITROSTAT) 0.4 MG SL tablet Place 1 tablet (0.4 mg total) under the tongue every 5 (five) minutes as needed for chest pain.   Omega-3 Fatty Acids (RA FISH OIL) 1400 MG CPDR Take 1,400 mg by mouth 2 (two) times daily.   ranolazine (RANEXA) 1000 MG SR tablet Take 1 tablet (1,000 mg total) by mouth 2 (two) times daily.   rosuvastatin (CRESTOR) 40 MG tablet TAKE 1 TABLET BY MOUTH EVERY DAY IN THE MORNING   saccharomyces boulardii (FLORASTOR) 250 MG capsule Take 250 mg by mouth  daily.   Semaglutide, 2 MG/DOSE, (OZEMPIC, 2 MG/DOSE,) 8 MG/3ML SOPN Inject 2 mg into the skin once a week.   trospium (SANCTURA) 20 MG tablet Take 20 mg by mouth 2 (two) times daily.   vitamin C (ASCORBIC ACID) 250 MG tablet Take 250 mg by mouth daily.   [DISCONTINUED] valsartan-hydrochlorothiazide (DIOVAN-HCT) 160-12.5 MG tablet Take 0.5 tablets by mouth daily.     Allergies:   Colchicine, Diclofenac, Janumet [sitagliptin-metformin hcl], Septra [sulfamethoxazole-trimethoprim], Trulicity [dulaglutide], Zetia [ezetimibe], Imipramine, and Tape   Social History   Socioeconomic History   Marital status: Married    Spouse name: Not on file   Number of children: Not on file   Years of education: Not on file   Highest education level: Not on file  Occupational History   Occupation: retired  Tobacco Use   Smoking status: Never   Smokeless tobacco: Never  Vaping Use   Vaping Use: Never used  Substance and Sexual Activity   Alcohol use: Yes    Comment: rare   Drug use: No   Sexual activity: Not on file  Other Topics Concern   Not on file  Social History Narrative   Not on file   Social Determinants of Health   Financial Resource Strain: Not on file  Food Insecurity: Not on file  Transportation Needs: Not on file  Physical Activity: Not on file  Stress: Not on file  Social Connections: Not on file     Family History: The patient's family  history includes Cancer in her mother; Congenital heart disease in her son; Diabetes in her maternal grandfather; Heart attack in her father, maternal grandfather, paternal uncle, and sister; Heart disease in her father and mother; Hyperlipidemia in her father and mother; Hypertension in her father and mother; Sleep apnea in her father; Sudden death in her father.  ROS:   Review of Systems  Constitution: Negative for decreased appetite, fever and weight gain.  HENT: Negative for congestion, ear discharge, hoarse voice and sore throat.   Eyes: Negative for discharge, redness, vision loss in right eye and visual halos.  Cardiovascular: Negative for chest pain, dyspnea on exertion, leg swelling, orthopnea and palpitations.  Respiratory: Negative for cough, hemoptysis, shortness of breath and snoring.   Endocrine: Negative for heat intolerance and polyphagia.  Hematologic/Lymphatic: Negative for bleeding problem. Does not bruise/bleed easily.  Skin: Negative for flushing, nail changes, rash and suspicious lesions.  Musculoskeletal: Negative for arthritis, joint pain, muscle cramps, myalgias, neck pain and stiffness.  Gastrointestinal: Negative for abdominal pain, bowel incontinence, diarrhea and excessive appetite.  Genitourinary: Negative for decreased libido, genital sores and incomplete emptying.  Neurological: Negative for brief paralysis, focal weakness, headaches and loss of balance.  Psychiatric/Behavioral: Negative for altered mental status, depression and suicidal ideas.  Allergic/Immunologic: Negative for HIV exposure and persistent infections.    EKGs/Labs/Other Studies Reviewed:    The following studies were reviewed today:   EKG: None today  Echocardiogram done May 2021 IMPRESSIONS   1. Left ventricular ejection fraction, by estimation, is 60 to 65%. The  left ventricle has normal function. The left ventricle has no regional  wall motion abnormalities. Left ventricular  diastolic parameters are  consistent with Grade I diastolic  dysfunction (impaired relaxation).   2. Right ventricular systolic function is normal. The right ventricular  size is normal.   3. The mitral valve is normal in structure. No evidence of mitral valve  regurgitation. No evidence of mitral stenosis.   4. The aortic  valve is tricuspid. Aortic valve regurgitation is not  visualized. Mild aortic valve sclerosis is present, with no evidence of  aortic valve stenosis.   5. The inferior vena cava is normal in size with greater than 50%  respiratory variability, suggesting right atrial pressure of 3 mmHg.   Left heart catheterization June 2021   Prox LAD to Mid LAD lesion is 60% stenosed. Prox RCA to Mid RCA lesion is 90% stenosed. A drug-eluting stent was successfully placed using a [place] [id number]. Post intervention, there is a 0% residual stenosis.        Recent Labs: 04/21/2020: ALT 25 05/12/2020: BUN 43; Creatinine, Ser 1.17; Magnesium 2.0; Potassium 3.6; Sodium 140  Recent Lipid Panel    Component Value Date/Time   CHOL 134 12/16/2020 0957   TRIG 142 12/16/2020 0957   HDL 41 12/16/2020 0957   CHOLHDL 3.3 12/16/2020 0957   LDLCALC 68 12/16/2020 0957    Physical Exam:    VS:  BP 128/70   Pulse 87   Ht _0  (1.6 m)   Wt 202 lb 3.2 oz (91.7 kg)   SpO2 97%   BMI 35.82 kg/m     Wt Readings from Last 3 Encounters:  01/26/21 202 lb 3.2 oz (91.7 kg)  01/25/21 201 lb (91.2 kg)  01/12/21 203 lb (92.1 kg)     GEN: Well nourished, well developed in no acute distress HEENT: Normal NECK: No JVD; No carotid bruits LYMPHATICS: No lymphadenopathy CARDIAC: S1S2 noted,RRR, no murmurs, rubs, gallops RESPIRATORY:  Clear to auscultation without rales, wheezing or rhonchi  ABDOMEN: Soft, non-tender, non-distended, +bowel sounds, no guarding. EXTREMITIES: No edema, No cyanosis, no clubbing MUSCULOSKELETAL:  No deformity  SKIN: Warm and dry NEUROLOGIC:  Alert  and oriented x 3, non-focal PSYCHIATRIC:  Normal affect, good insight  ASSESSMENT:    1. Coronary artery disease involving native coronary artery of native heart with unstable angina pectoris ([place])   2. Hypertension, unspecified type   3. Hypertension associated with type 2 diabetes mellitus ([place])   4. OSA (obstructive sleep apnea)   5. Stage 3a chronic kidney disease (HCC)    PLAN:      With her persistent anginal pain on antianginals and not been able to tolerate Imdur the patient is high risk for progression of coronary artery disease therefore I like to proceed with a left heart catheterization in this patient to understand her coronary anatomy as well as hopefully with her known 60% LAD lesion we will also understand her coronary flow during her heart catheterization.    If she does not require any PCI my plan is to increase her beta-blocker and ultimately add calcium channel blockers.  The patient is in agreement with the above plan. The patient left the office in stable condition.  The patient will follow up in 6 weeks post cath   Medication Adjustments/Labs and Tests Ordered: Current medicines are reviewed at length with the patient today.  Concerns regarding medicines are outlined above.  Orders Placed This Encounter  Procedures   Basic metabolic panel   CBC with Differential/Platelet   Magnesium   No orders of the defined types were placed in this encounter.   Patient Instructions  Medication Instructions:   Your physician recommends that you continue on your current medications as directed. Please refer to the Current Medication list given to you today.  *If you need a refill on your cardiac medications before your next appointment, please call your pharmacy*  Lab Work: Your physician recommends that you return for lab work in:  TODAY: BMET, [place], CBC  If you have labs (blood work) drawn today and your tests are completely normal, you will receive your results  only by: [employer] (if you have [place]) OR A paper copy in the mail If you have any lab test that is abnormal or we need to change your treatment, we will call you to review the results.   Testing/Procedures:     [place] [address] Dept: [phone] Loc: [phone]  Chancey Cullinane  01/26/2021  You are scheduled for a Cardiac Catheterization on Friday, July 1 with Dr. Larae Grooms.  1. Please arrive at the [facility] (Main Entrance A) at [facility]: [address] [place], [place] [address] at 8:30 AM (This time is two hours before your procedure to ensure your preparation). Free valet parking service is available.   Special note: Every effort is made to have your procedure done on time. Please understand that emergencies sometimes delay scheduled procedures.  2. Diet: Do not eat solid foods after midnight.  The patient may have clear liquids until 5am upon the day of the procedure.  3. Labs: You will need to have blood drawn on TODAY.  4. Medication instructions in preparation for your procedure:   Contrast Allergy: No  Take only 1/2 dose of insulin the night before your procedure. Do not take any insulin on the day of the procedure.  On the morning of your procedure, take your Aspirin and any morning medicines NOT listed above.  You may use sips of water.  5. Plan for one night stay--bring personal belongings. 6. Bring a current list of your medications and current insurance cards. 7. You MUST have a responsible person to drive you home. 8. Someone MUST be with you the first 24 hours after you arrive home or your discharge will be delayed. 9. Please wear clothes that are easy to get on and off and wear slip-on shoes.  Thank you for allowing [place] to care for you!   -- [facility]    Follow-Up: At  [facility], you and your health needs are our priority.  As part of our continuing mission to provide you with exceptional heart care, we have created designated Provider Care Teams.  These Care Teams include your primary Cardiologist (physician) and Advanced Practice Providers (APPs -  Physician Assistants and Nurse Practitioners) who all work together to provide you with the care you need, when you need it.  We recommend signing up for the patient portal called "MyChart".  Sign up information is provided on this After Visit Summary.  MyChart is used to connect with patients for Virtual Visits (Telemedicine).  Patients are able to view lab/test results, encounter notes, upcoming appointments, etc.  Non-urgent messages can be sent to your provider as well.   To learn more about what you can do with MyChart, go to [email or web address].    Your next appointment:   4 week(s)  The format for your next appointment:   In Person  Provider:   Berniece Salines, DO   Other Instructions  .[email or web address]   Adopting a Healthy Lifestyle.  Know what a healthy weight is for you (roughly BMI <25) and aim to maintain this   Aim for 7+ servings of fruits and vegetables daily   65-80+ fluid ounces of water  or unsweet tea for healthy kidneys   Limit to max 1 drink of alcohol per day; avoid smoking/tobacco   Limit animal fats in diet for cholesterol and heart health - choose grass fed whenever available   Avoid highly processed foods, and foods high in saturated/trans fats   Aim for low stress - take time to unwind and care for your mental health   Aim for 150 min of moderate intensity exercise weekly for heart health, and weights twice weekly for bone health   Aim for 7-9 hours of sleep daily   When it comes to diets, agreement about the perfect plan isnt easy to find, even among the experts. Experts at the [employer] developed an idea known as the Healthy Eating Plate. Just  imagine a plate divided into logical, healthy portions.   The emphasis is on diet quality:   Load up on vegetables and fruits - one-half of your plate: Aim for color and variety, and remember that potatoes dont count.   Go for whole grains - one-quarter of your plate: Whole wheat, barley, wheat berries, quinoa, oats, brown rice, and foods made with them. If you want pasta, go with whole wheat pasta.   Protein power - one-quarter of your plate: Fish, chicken, beans, and nuts are all healthy, versatile protein sources. Limit red meat.   The diet, however, does go beyond the plate, offering a few other suggestions.   Use healthy plant oils, such as olive, canola, soy, corn, sunflower and peanut. Check the labels, and avoid partially hydrogenated oil, which have unhealthy trans fats.   If youre thirsty, drink water. Coffee and tea are good in moderation, but skip sugary drinks and limit milk and dairy products to one or two daily servings.   The type of carbohydrate in the diet is more important than the amount. Some sources of carbohydrates, such as vegetables, fruits, whole grains, and beans-are healthier than others.   Finally, stay active  Signed, Berniece Salines, DO  01/26/2021 2:53 PM    [facility]

## 2021-01-27 LAB — BASIC METABOLIC PANEL
BUN/Creatinine Ratio: 27 (ref 12–28)
BUN: 28 mg/dL — ABNORMAL HIGH (ref 8–27)
CO2: 23 mmol/L (ref 20–29)
Calcium: 9.9 mg/dL (ref 8.7–10.3)
Chloride: 103 mmol/L (ref 96–106)
Creatinine, Ser: 1.02 mg/dL — ABNORMAL HIGH (ref 0.57–1.00)
Glucose: 99 mg/dL (ref 65–99)
Potassium: 4.6 mmol/L (ref 3.5–5.2)
Sodium: 140 mmol/L (ref 134–144)
eGFR: 58 mL/min/{1.73_m2} — ABNORMAL LOW (ref >59)

## 2021-01-27 LAB — CBC WITH DIFFERENTIAL/PLATELET
Basophils Absolute: 0 10*3/uL (ref 0.0–0.2)
Basos: 1 %
EOS (ABSOLUTE): 0.1 10*3/uL (ref 0.0–0.4)
Eos: 2 %
Hematocrit: 41.6 % (ref 34.0–46.6)
Hemoglobin: 13.8 g/dL (ref 11.1–15.9)
Immature Grans (Abs): 0 10*3/uL (ref 0.0–0.1)
Immature Granulocytes: 0 %
Lymphocytes Absolute: 2.2 10*3/uL (ref 0.7–3.1)
Lymphs: 32 %
MCH: 30.5 pg (ref 26.6–33.0)
MCHC: 33.2 g/dL (ref 31.5–35.7)
MCV: 92 fL (ref 79–97)
Monocytes Absolute: 0.6 10*3/uL (ref 0.1–0.9)
Monocytes: 8 %
Neutrophils Absolute: 4.1 10*3/uL (ref 1.4–7.0)
Neutrophils: 57 %
Platelets: 229 10*3/uL (ref 150–450)
RBC: 4.53 x10E6/uL (ref 3.77–5.28)
RDW: 13.2 % (ref 11.7–15.4)
WBC: 7 10*3/uL (ref 3.4–10.8)

## 2021-01-27 LAB — MAGNESIUM: Magnesium: 2 mg/dL (ref 1.6–2.3)

## 2021-01-27 NOTE — Progress Notes (Signed)
Chief Complaint:   OBESITY Pamela Love is here to discuss her progress with her obesity treatment plan along with follow-up of her obesity related diagnoses. Pamela Love is on the Category 2 Plan and states she is following her eating plan approximately 50% of the time. Pamela Love states she is walking for 30 minutes 2 times per week.  Today's visit was #: 22 Starting weight: 277 lbs Starting date: 03/17/2020 Today's weight: 201 lbs Today's date: 01/25/2021 Total lbs lost to date: 76 Total lbs lost since last in-office visit: 2  Interim History: [place]. She is seeing her Cardiologist tomorrow. She has had no challenges with the plan.  Subjective:   1. Type 2 diabetes mellitus with other specified complication, with long-term current use of insulin (HCC) Pamela Love is on Ozempic 2 mg and Toujeo 46 units. Last fasting blood sugar was 102. She is followed by Dr. Everardo All, and she denies hypoglycemia.  Assessment/Plan:   1. Type 2 diabetes mellitus with other specified complication, with long-term current use of insulin (HCC) Pamela Love will continue to follow up with Dr. Everardo All. Good blood sugar control is important to decrease the likelihood of diabetic complications such as nephropathy, neuropathy, limb loss, blindness, coronary artery disease, and death. Intensive lifestyle modification including diet, exercise and weight loss are the first line of treatment for diabetes.   2. Class 2 severe obesity with serious comorbidity and body mass index (BMI) of 39.0 to 39.9 in adult, unspecified obesity type (HCC) Pamela Love is currently in the action stage of change. As such, her goal is to continue with weight loss efforts. She has agreed to the Category 2 Plan.   Exercise goals: As is.  Behavioral modification strategies: meal planning and cooking strategies and planning for success.  Pamela Love has agreed to follow-up with our clinic in 3 weeks. She was informed of the importance of  frequent follow-up visits to maximize her success with intensive lifestyle modifications for her multiple health conditions.   Objective:   Blood pressure 135/78, pulse 80, temperature 98.2 F (36.8 C), height 5\' 3"  (1.6 m), weight 201 lb (91.2 kg), SpO2 99 %. Body mass index is 35.61 kg/m.  General: Cooperative, alert, well developed, in no acute distress. HEENT: Conjunctivae and lids unremarkable. Cardiovascular: Regular rhythm.  Lungs: Normal work of breathing. Neurologic: No focal deficits.   Lab Results  Component Value Date   CREATININE 1.02 (H) 01/26/2021   BUN 28 (H) 01/26/2021   NA 140 01/26/2021   K 4.6 01/26/2021   CL 103 01/26/2021   CO2 23 01/26/2021   Lab Results  Component Value Date   ALT 25 04/21/2020   AST 25 04/21/2020   ALKPHOS 104 04/21/2020   BILITOT 0.5 04/21/2020   Lab Results  Component Value Date   HGBA1C 7.0 (A) 12/10/2020   HGBA1C 6.3 (A) 09/02/2020   HGBA1C 6.5 (A) 05/27/2020   HGBA1C 7.9 (H) 04/21/2020   HGBA1C 8.0 (A) 02/20/2020   No results found for: INSULIN Lab Results  Component Value Date   TSH 1.71 05/30/2018   Lab Results  Component Value Date   CHOL 134 12/16/2020   HDL 41 12/16/2020   LDLCALC 68 12/16/2020   TRIG 142 12/16/2020   CHOLHDL 3.3 12/16/2020   Lab Results  Component Value Date   VD25OH 63.9 04/21/2020   Lab Results  Component Value Date   WBC 7.0 01/26/2021   HGB 13.8 01/26/2021   HCT 41.6 01/26/2021  MCV 92 01/26/2021   PLT 229 01/26/2021   No results found for: IRON, TIBC, FERRITIN  Obesity Behavioral Intervention:   Approximately 15 minutes were spent on the discussion below.  ASK: We discussed the diagnosis of obesity with Pamela Love today and Pamela Love agreed to give [place] permission to discuss obesity behavioral modification therapy today.  ASSESS: Pamela Love has the diagnosis of obesity and her BMI today is 35.61. Pamela Love is in the action stage of change.   ADVISE: Pamela Love was educated on the multiple  health risks of obesity as well as the benefit of weight loss to improve her health. She was advised of the need for long term treatment and the importance of lifestyle modifications to improve her current health and to decrease her risk of future health problems.  AGREE: Multiple dietary modification options and treatment options were discussed and Pamela Love agreed to follow the recommendations documented in the above note.  ARRANGE: Pamela Love was educated on the importance of frequent visits to treat obesity as outlined per CMS and USPSTF guidelines and agreed to schedule her next follow up appointment today.  Attestation Statements:   Reviewed by clinician on day of visit: allergies, medications, problem list, medical history, surgical history, family history, social history, and previous encounter notes.   Trude Mcburney, am acting as transcriptionist for [employer], PA-C.  I have reviewed the above documentation for accuracy and completeness, and I agree with the above. Alois Cliche, PA-C

## 2021-01-28 ENCOUNTER — Telehealth: Payer: Self-pay | Admitting: *Deleted

## 2021-01-28 NOTE — Telephone Encounter (Signed)
Pt contacted pre-catheterization scheduled at [facility] for: Friday January 29, 2021 10:30 AM Verified arrival time and place: [facility] A [facility]) at: 8:30 AM   No solid food after midnight prior to cath, clear liquids until 5 AM day of procedure.  Hold: Insulin-AM of procedure  Except hold medications AM meds can be  taken pre-cath with sips of water including: aspirin 81 mg Plavix 75 mg  Confirmed patient has responsible adult to drive home post procedure and be with patient first 24 hours after arriving home: yes  You are allowed ONE visitor in the waiting room during the time you are at the hospital for your procedure. Both you and your visitor must wear a mask once you enter the hospital.   Patient reports does not currently have any symptoms concerning for COVID-19 and no household members with COVID-19 like illness.      Reviewed procedure/mask/visitor instructions with patient.

## 2021-01-29 ENCOUNTER — Ambulatory Visit (HOSPITAL_COMMUNITY)
Admission: RE | Admit: 2021-01-29 | Discharge: 2021-01-29 | Disposition: A | Discharge status: Home / Self Care | Payer: Medicare PPO | Attending: Interventional Cardiology | Admitting: Interventional Cardiology

## 2021-01-29 ENCOUNTER — Encounter (HOSPITAL_COMMUNITY): Payer: Self-pay | Admitting: Interventional Cardiology

## 2021-01-29 ENCOUNTER — Encounter (HOSPITAL_COMMUNITY)
Admission: RE | Disposition: A | Discharge status: Home / Self Care | Payer: Self-pay | Source: Home / Self Care | Attending: Interventional Cardiology

## 2021-01-29 ENCOUNTER — Other Ambulatory Visit: Payer: Self-pay

## 2021-01-29 DIAGNOSIS — Z888 Allergy status to other drugs, medicaments and biological substances status: Secondary | ICD-10-CM | POA: Diagnosis not present

## 2021-01-29 DIAGNOSIS — Z79899 Other long term (current) drug therapy: Secondary | ICD-10-CM | POA: Insufficient documentation

## 2021-01-29 DIAGNOSIS — E119 Type 2 diabetes mellitus without complications: Secondary | ICD-10-CM | POA: Diagnosis not present

## 2021-01-29 DIAGNOSIS — I25118 Atherosclerotic heart disease of native coronary artery with other forms of angina pectoris: Secondary | ICD-10-CM | POA: Insufficient documentation

## 2021-01-29 DIAGNOSIS — G4733 Obstructive sleep apnea (adult) (pediatric): Secondary | ICD-10-CM | POA: Insufficient documentation

## 2021-01-29 DIAGNOSIS — Z7902 Long term (current) use of antithrombotics/antiplatelets: Secondary | ICD-10-CM | POA: Insufficient documentation

## 2021-01-29 DIAGNOSIS — Z955 Presence of coronary angioplasty implant and graft: Secondary | ICD-10-CM | POA: Insufficient documentation

## 2021-01-29 DIAGNOSIS — N1831 Chronic kidney disease, stage 3a: Secondary | ICD-10-CM | POA: Diagnosis not present

## 2021-01-29 DIAGNOSIS — I120 Hypertensive chronic kidney disease with stage 5 chronic kidney disease or end stage renal disease: Secondary | ICD-10-CM | POA: Insufficient documentation

## 2021-01-29 DIAGNOSIS — Z794 Long term (current) use of insulin: Secondary | ICD-10-CM | POA: Diagnosis not present

## 2021-01-29 DIAGNOSIS — E785 Hyperlipidemia, unspecified: Secondary | ICD-10-CM | POA: Diagnosis not present

## 2021-01-29 DIAGNOSIS — Z882 Allergy status to sulfonamides status: Secondary | ICD-10-CM | POA: Insufficient documentation

## 2021-01-29 DIAGNOSIS — Z7982 Long term (current) use of aspirin: Secondary | ICD-10-CM | POA: Insufficient documentation

## 2021-01-29 DIAGNOSIS — I25110 Atherosclerotic heart disease of native coronary artery with unstable angina pectoris: Secondary | ICD-10-CM | POA: Diagnosis not present

## 2021-01-29 HISTORY — PX: LEFT HEART CATH AND CORONARY ANGIOGRAPHY: CATH118249

## 2021-01-29 LAB — GLUCOSE, CAPILLARY
Glucose-Capillary: 104 mg/dL — ABNORMAL HIGH (ref 70–99)
Glucose-Capillary: 93 mg/dL (ref 70–99)

## 2021-01-29 SURGERY — LEFT HEART CATH AND CORONARY ANGIOGRAPHY
Anesthesia: LOCAL

## 2021-01-29 MED ORDER — SODIUM CHLORIDE 0.9 % IV SOLN: 250.0000 mL | INTRAVENOUS | Status: DC | PRN

## 2021-01-29 MED ORDER — MIDAZOLAM HCL 2 MG/2ML IJ SOLN
INTRAMUSCULAR | Status: AC
  Filled 2021-01-29: qty 2

## 2021-01-29 MED ORDER — ROSUVASTATIN CALCIUM 40 MG PO TABS: 40.0000 mg | ORAL_TABLET | Freq: Every morning | ORAL | Status: DC

## 2021-01-29 MED ORDER — HEPARIN (PORCINE) IN NACL 1000-0.9 UT/500ML-% IV SOLN
INTRAVENOUS | Status: AC
  Filled 2021-01-29: qty 500

## 2021-01-29 MED ORDER — SODIUM CHLORIDE 0.9% FLUSH: 3.0000 mL | Freq: Two times a day (BID) | INTRAVENOUS | Status: DC

## 2021-01-29 MED ORDER — ACETAMINOPHEN 325 MG PO TABS: 650.0000 mg | ORAL_TABLET | ORAL | Status: DC | PRN

## 2021-01-29 MED ORDER — IOHEXOL 350 MG/ML SOLN
INTRAVENOUS | Status: DC | PRN
  Administered 2021-01-29: 50 mL

## 2021-01-29 MED ORDER — LABETALOL HCL 5 MG/ML IV SOLN: 10.0000 mg | INTRAVENOUS | Status: DC | PRN

## 2021-01-29 MED ORDER — MIDAZOLAM HCL 2 MG/2ML IJ SOLN
INTRAMUSCULAR | Status: DC | PRN
  Administered 2021-01-29: 2 mg via INTRAVENOUS

## 2021-01-29 MED ORDER — ONDANSETRON HCL 4 MG/2ML IJ SOLN: 4.0000 mg | Freq: Four times a day (QID) | INTRAMUSCULAR | Status: DC | PRN

## 2021-01-29 MED ORDER — HYDRALAZINE HCL 20 MG/ML IJ SOLN: 10.0000 mg | INTRAMUSCULAR | Status: DC | PRN

## 2021-01-29 MED ORDER — LIDOCAINE HCL (PF) 1 % IJ SOLN
INTRAMUSCULAR | Status: DC | PRN
  Administered 2021-01-29: 2 mL

## 2021-01-29 MED ORDER — FENTANYL CITRATE (PF) 100 MCG/2ML IJ SOLN
INTRAMUSCULAR | Status: AC
  Filled 2021-01-29: qty 2

## 2021-01-29 MED ORDER — SODIUM CHLORIDE 0.9% FLUSH
3.0000 mL | Freq: Two times a day (BID) | INTRAVENOUS | Status: DC
Start: 2021-01-29 — End: 2021-01-29

## 2021-01-29 MED ORDER — SODIUM CHLORIDE 0.9 % WEIGHT BASED INFUSION: 1.0000 mL/kg/h | INTRAVENOUS | Status: DC

## 2021-01-29 MED ORDER — BD PEN NEEDLE NANO U/F 32G X 4 MM MISC: 1.0000 | Freq: Every day | 0 refills | Status: DC

## 2021-01-29 MED ORDER — FUROSEMIDE 40 MG PO TABS
40.0000 mg | ORAL_TABLET | ORAL | Status: DC
Start: 2021-01-29 — End: 2021-03-10

## 2021-01-29 MED ORDER — HEPARIN (PORCINE) IN NACL 1000-0.9 UT/500ML-% IV SOLN
INTRAVENOUS | Status: DC | PRN
  Administered 2021-01-29 (×2): 500 mL

## 2021-01-29 MED ORDER — TOUJEO MAX SOLOSTAR 300 UNIT/ML ~~LOC~~ SOPN: 45.0000 [IU] | PEN_INJECTOR | Freq: Every morning | SUBCUTANEOUS | Status: DC

## 2021-01-29 MED ORDER — SODIUM CHLORIDE 0.9 % IV SOLN: INTRAVENOUS | Status: AC

## 2021-01-29 MED ORDER — SODIUM CHLORIDE 0.9% FLUSH: 3.0000 mL | INTRAVENOUS | Status: DC | PRN

## 2021-01-29 MED ORDER — VERAPAMIL HCL 2.5 MG/ML IV SOLN
INTRAVENOUS | Status: DC | PRN
  Administered 2021-01-29: 10 mL via INTRA_ARTERIAL

## 2021-01-29 MED ORDER — NITROGLYCERIN 0.4 MG SL SUBL: 0.4000 mg | SUBLINGUAL_TABLET | SUBLINGUAL | Status: DC | PRN

## 2021-01-29 MED ORDER — HEPARIN SODIUM (PORCINE) 1000 UNIT/ML IJ SOLN
INTRAMUSCULAR | Status: AC
  Filled 2021-01-29: qty 1

## 2021-01-29 MED ORDER — HEPARIN SODIUM (PORCINE) 1000 UNIT/ML IJ SOLN
INTRAMUSCULAR | Status: DC | PRN
  Administered 2021-01-29: 4500 [IU] via INTRAVENOUS

## 2021-01-29 MED ORDER — POTASSIUM CHLORIDE CRYS ER 20 MEQ PO TBCR: 20.0000 meq | EXTENDED_RELEASE_TABLET | ORAL | Status: DC

## 2021-01-29 MED ORDER — FENTANYL CITRATE (PF) 100 MCG/2ML IJ SOLN
INTRAMUSCULAR | Status: DC | PRN
  Administered 2021-01-29: 25 ug via INTRAVENOUS

## 2021-01-29 MED ORDER — SODIUM CHLORIDE 0.9 % WEIGHT BASED INFUSION
3.0000 mL/kg/h | INTRAVENOUS | Status: AC
  Administered 2021-01-29: 3 mL/kg/h via INTRAVENOUS

## 2021-01-29 MED ORDER — ASPIRIN EC 81 MG PO TBEC: 81.0000 mg | DELAYED_RELEASE_TABLET | Freq: Every morning | ORAL | Status: AC

## 2021-01-29 MED ORDER — VERAPAMIL HCL 2.5 MG/ML IV SOLN
INTRAVENOUS | Status: AC
  Filled 2021-01-29: qty 2

## 2021-01-29 MED ORDER — OZEMPIC (2 MG/DOSE) 8 MG/3ML ~~LOC~~ SOPN: 2.0000 mg | PEN_INJECTOR | SUBCUTANEOUS | Status: DC

## 2021-01-29 MED ORDER — LIDOCAINE HCL (PF) 1 % IJ SOLN
INTRAMUSCULAR | Status: AC
  Filled 2021-01-29: qty 30

## 2021-01-29 SURGICAL SUPPLY — 8 items
CATH 5FR JL3.5 JR4 ANG PIG MP (CATHETERS) ×2 IMPLANT
DEVICE RAD COMP TR BAND LRG (VASCULAR PRODUCTS) ×2 IMPLANT
GLIDESHEATH SLEND SS 6F .021 (SHEATH) ×2 IMPLANT
KIT HEART LEFT (KITS) ×2 IMPLANT
PACK CARDIAC CATHETERIZATION (CUSTOM PROCEDURE TRAY) ×2 IMPLANT
TRANSDUCER W/STOPCOCK (MISCELLANEOUS) ×2 IMPLANT
TUBING CIL FLEX 10 FLL-RA (TUBING) ×2 IMPLANT
WIRE HI TORQ VERSACORE J 260CM (WIRE) ×2 IMPLANT

## 2021-01-29 NOTE — Interval H&P Note (Signed)
History and Physical Interval Note:Cath Lab Visit (complete for each Cath Lab visit)  Clinical Evaluation Leading to the Procedure:   ACS: No.  Non-ACS:    Anginal Classification: CCS III  Anti-ischemic medical therapy: Minimal Therapy (1 class of medications)  Non-Invasive Test Results: No non-invasive testing performed  Prior CABG: No previous CABG   Prior RCA stents     01/29/2021 10:39 AM  Pamela Love  has presented today for surgery, with the diagnosis of cad - angina.  The various methods of treatment have been discussed with the patient and family. After consideration of risks, benefits and other options for treatment, the patient has consented to  Procedure(s): LEFT HEART CATH AND CORONARY ANGIOGRAPHY (N/A) as a surgical intervention.  The patient's history has been reviewed, patient examined, no change in status, stable for surgery.  I have reviewed the patient's chart and labs.  Questions were answered to the patient's satisfaction.     Lance Muss

## 2021-01-29 NOTE — Progress Notes (Signed)
Pt ambulated without difficulty or bleeding.   Discharged home with her husband who will drive and stay with pt x 24 hrs. 

## 2021-01-29 NOTE — Discharge Instructions (Signed)
Radial Site Care  This sheet gives you information about how to care for yourself after your procedure. Your health care provider may also give you more specific instructions. If you have problems or questions, contact your health care provider. What can I expect after the procedure? After the procedure, it is common to have: Bruising and tenderness at the catheter insertion area. Follow these instructions at home: Medicines Take over-the-counter and prescription medicines only as told by your health care provider. Insertion site care Follow instructions from your health care provider about how to take care of your insertion site. Make sure you: Wash your hands with soap and water before you remove your bandage (dressing). If soap and water are not available, use hand sanitizer. May remove dressing in 24 hours. Check your insertion site every day for signs of infection. Check for: Redness, swelling, or pain. Fluid or blood. Pus or a bad smell. Warmth. Do no take baths, swim, or use a hot tub for 5 days. You may shower 24-48 hours after the procedure. Remove the dressing and gently wash the site with plain soap and water. Pat the area dry with a clean towel. Do not rub the site. That could cause bleeding. Do not apply powder or lotion to the site. Activity  For 24 hours after the procedure, or as directed by your health care provider: Do not flex or bend the affected arm. Do not push or pull heavy objects with the affected arm. Do not drive yourself home from the hospital or clinic. You may drive 24 hours after the procedure. Do not operate machinery or power tools. KEEP ARM ELEVATED THE REMAINDER OF THE DAY. Do not push, pull or lift anything that is heavier than 10 lb for 5 days. Ask your health care provider when it is okay to: Return to work or school. Resume usual physical activities or sports. Resume sexual activity. General instructions If the catheter site starts to  bleed, raise your arm and put firm pressure on the site. If the bleeding does not stop, get help right away. This is a medical emergency. DRINK PLENTY OF FLUIDS FOR THE NEXT 2-3 DAYS. No alcohol consumption for 24 hours after receiving sedation. If you went home on the same day as your procedure, a responsible adult should be with you for the first 24 hours after you arrive home. Keep all follow-up visits as told by your health care provider. This is important. Contact a health care provider if: You have a fever. You have redness, swelling, or yellow drainage around your insertion site. Get help right away if: You have unusual pain at the radial site. The catheter insertion area swells very fast. The insertion area is bleeding, and the bleeding does not stop when you hold steady pressure on the area. Your arm or hand becomes pale, cool, tingly, or numb. These symptoms may represent a serious problem that is an emergency. Do not wait to see if the symptoms will go away. Get medical help right away. Call your local emergency services (911 in the [place] Do not drive yourself to the hospital. Summary After the procedure, it is common to have bruising and tenderness at the site. Follow instructions from your health care provider about how to take care of your radial site wound. Check the wound every day for signs of infection.  This information is not intended to replace advice given to you by your health care provider. Make sure you discuss any questions you have with   your health care provider. Document Revised: 08/23/2017 Document Reviewed: 08/23/2017 Elsevier Patient Education  2020 [employer]  

## 2021-02-01 ENCOUNTER — Other Ambulatory Visit: Payer: Self-pay | Admitting: Endocrinology

## 2021-02-01 DIAGNOSIS — E1122 Type 2 diabetes mellitus with diabetic chronic kidney disease: Secondary | ICD-10-CM

## 2021-02-02 ENCOUNTER — Telehealth: Payer: Self-pay | Admitting: Cardiology

## 2021-02-02 NOTE — Telephone Encounter (Signed)
Pt would like to request a Provider Switch from Dr. Servando Salina to Dr.  Jodelle Red

## 2021-02-02 NOTE — Telephone Encounter (Signed)
Pt called to request a Toujeo refill for 90 days. Please send to:  [id number] - [place], [place] - [address] Phone:  [phone]  Fax:  [phone]

## 2021-02-03 NOTE — Telephone Encounter (Signed)
Ok by me. Thanks.

## 2021-02-04 NOTE — Telephone Encounter (Signed)
Patient called again to request the following RX:  MEDICATION: insulin glargine, 2 Unit Dial, (TOUJEO MAX SOLOSTAR) 300 UNIT/ML Solostar Pen  PHARMACY:   [id number] - [place], [place] - [address] AT [address] Phone: [phone]  Fax: [phone]      HAS THE PATIENT CONTACTED THEIR PHARMACY?  Yes  IS THIS A 90 DAY SUPPLY : Yes  IS PATIENT OUT OF MEDICATION: No  IF NOT; HOW MUCH IS LEFT: Approx. 5 days  LAST APPOINTMENT DATE: @6 /28/2022  NEXT APPOINTMENT DATE:@9 /07/2021  DO WE HAVE YOUR PERMISSION TO LEAVE A DETAILED MESSAGE?: Yes  OTHER COMMENTS:    **Let patient know to contact pharmacy at the end of the day to make sure medication is ready. **  ** Please notify patient to allow 48-72 hours to process**  **Encourage patient to contact the pharmacy for refills or they can request refills through [facility]**

## 2021-02-07 ENCOUNTER — Other Ambulatory Visit: Payer: Self-pay

## 2021-02-08 ENCOUNTER — Telehealth: Payer: Self-pay | Admitting: Endocrinology

## 2021-02-08 ENCOUNTER — Other Ambulatory Visit: Payer: Self-pay

## 2021-02-08 DIAGNOSIS — E1169 Type 2 diabetes mellitus with other specified complication: Secondary | ICD-10-CM

## 2021-02-08 DIAGNOSIS — N1831 Chronic kidney disease, stage 3a: Secondary | ICD-10-CM

## 2021-02-08 DIAGNOSIS — Z794 Long term (current) use of insulin: Secondary | ICD-10-CM

## 2021-02-08 DIAGNOSIS — E1122 Type 2 diabetes mellitus with diabetic chronic kidney disease: Secondary | ICD-10-CM

## 2021-02-08 MED ORDER — TOUJEO MAX SOLOSTAR 300 UNIT/ML ~~LOC~~ SOPN: 46.0000 [IU] | PEN_INJECTOR | Freq: Every morning | SUBCUTANEOUS | 3 refills | Status: DC

## 2021-02-08 MED ORDER — TOUJEO MAX SOLOSTAR 300 UNIT/ML ~~LOC~~ SOPN: 45.0000 [IU] | PEN_INJECTOR | Freq: Every morning | SUBCUTANEOUS | 3 refills | Status: DC

## 2021-02-08 MED ORDER — BD PEN NEEDLE NANO U/F 32G X 4 MM MISC: 1.0000 | Freq: Every day | 3 refills | Status: DC

## 2021-02-08 NOTE — Telephone Encounter (Signed)
[id number] - [place], [place] - [address]    Pharmacy called to advise that they need clarification on the RX for Toujeo -    directions were sent as "Inject 46 Units into the skin in the morning. And 4 mm pen needles 2/day - Subcutaneous"

## 2021-02-10 DIAGNOSIS — N644 Mastodynia: Secondary | ICD-10-CM | POA: Diagnosis not present

## 2021-02-15 ENCOUNTER — Ambulatory Visit (INDEPENDENT_AMBULATORY_CARE_PROVIDER_SITE_OTHER): Payer: Medicare PPO | Admitting: Family Medicine

## 2021-02-15 DIAGNOSIS — G4733 Obstructive sleep apnea (adult) (pediatric): Secondary | ICD-10-CM | POA: Diagnosis not present

## 2021-02-18 ENCOUNTER — Other Ambulatory Visit (INDEPENDENT_AMBULATORY_CARE_PROVIDER_SITE_OTHER): Payer: Self-pay | Admitting: Family Medicine

## 2021-02-18 DIAGNOSIS — Z794 Long term (current) use of insulin: Secondary | ICD-10-CM

## 2021-02-18 DIAGNOSIS — E1169 Type 2 diabetes mellitus with other specified complication: Secondary | ICD-10-CM

## 2021-02-19 DIAGNOSIS — Z23 Encounter for immunization: Secondary | ICD-10-CM | POA: Diagnosis not present

## 2021-02-19 DIAGNOSIS — Z20822 Contact with and (suspected) exposure to covid-19: Secondary | ICD-10-CM | POA: Diagnosis not present

## 2021-02-19 DIAGNOSIS — S60470A Other superficial bite of right index finger, initial encounter: Secondary | ICD-10-CM | POA: Diagnosis not present

## 2021-02-19 DIAGNOSIS — W540XXA Bitten by dog, initial encounter: Secondary | ICD-10-CM | POA: Diagnosis not present

## 2021-02-22 ENCOUNTER — Encounter (INDEPENDENT_AMBULATORY_CARE_PROVIDER_SITE_OTHER): Payer: Self-pay | Admitting: Family Medicine

## 2021-02-22 NOTE — Telephone Encounter (Signed)
Last seen Tracey 

## 2021-02-23 NOTE — Telephone Encounter (Signed)
LAST APPOINTMENT DATE: 01/25/2021 NEXT APPOINTMENT DATE: 03/08/2021   [id number] - [place], [place] - [address] [place] [address] Phone: [phone] Fax: [phone]  Patient is requesting a refill of the following medications: No prescriptions requested or ordered in this encounter   Date last filled: 02/02/2021 Previously prescribed by Dr Eldridge Dace  Lab Results      Component                Value               Date                      HGBA1C                   7.0 (A)             12/10/2020                HGBA1C                   6.3 (A)             09/02/2020                HGBA1C                   6.5 (A)             05/27/2020           Lab Results      Component                Value               Date                      LDLCALC                  68                  12/16/2020                CREATININE               1.02 (H)            01/26/2021           Lab Results      Component                Value               Date                      VD25OH                   63.9                04/21/2020            BP Readings from Last 3 Encounters: 01/29/21 : 120/66 01/26/21 : 128/70 01/25/21 : 135/78

## 2021-02-24 ENCOUNTER — Telehealth (INDEPENDENT_AMBULATORY_CARE_PROVIDER_SITE_OTHER): Payer: Self-pay

## 2021-02-24 ENCOUNTER — Encounter (INDEPENDENT_AMBULATORY_CARE_PROVIDER_SITE_OTHER): Payer: Self-pay

## 2021-02-24 NOTE — Telephone Encounter (Signed)
Pt also notified of this via mychart.

## 2021-02-24 NOTE — Telephone Encounter (Signed)
Pt was calling in asking the status of her med refill for her Ozempic. Pt is requesting a call back. Please advise

## 2021-02-24 NOTE — Telephone Encounter (Signed)
Medication was last refilled by Dr. Eldridge Dace on 02/02/21, and should sustain pt until her appt on 03/08/21.

## 2021-02-24 NOTE — Telephone Encounter (Signed)
Please tell patient that her endocrinologist can fill her rx as she sees Dr. Everardo All.

## 2021-02-25 ENCOUNTER — Telehealth (INDEPENDENT_AMBULATORY_CARE_PROVIDER_SITE_OTHER): Payer: Self-pay | Admitting: Physician Assistant

## 2021-02-25 NOTE — Telephone Encounter (Signed)
Patient calling regarding Mychart message. She says Dr. Earlene Plater or [facility] fills her Ozempic prescription.

## 2021-02-25 NOTE — Telephone Encounter (Signed)
Pt advised via Mychart already that a provider at [facility] refilled this medication and she should contact that provider with questions concerning this last refill.

## 2021-02-25 NOTE — Telephone Encounter (Signed)
Pt was already notified that her refill will be done at her next visit. Tresha Muzio, CMA

## 2021-02-26 ENCOUNTER — Encounter (INDEPENDENT_AMBULATORY_CARE_PROVIDER_SITE_OTHER): Payer: Self-pay | Admitting: Family Medicine

## 2021-03-01 ENCOUNTER — Other Ambulatory Visit (INDEPENDENT_AMBULATORY_CARE_PROVIDER_SITE_OTHER): Payer: Self-pay

## 2021-03-01 DIAGNOSIS — E1169 Type 2 diabetes mellitus with other specified complication: Secondary | ICD-10-CM

## 2021-03-01 DIAGNOSIS — Z794 Long term (current) use of insulin: Secondary | ICD-10-CM

## 2021-03-01 MED ORDER — OZEMPIC (2 MG/DOSE) 8 MG/3ML ~~LOC~~ SOPN: 2.0000 mg | PEN_INJECTOR | SUBCUTANEOUS | 0 refills | Status: DC

## 2021-03-01 NOTE — Telephone Encounter (Signed)
Will contact pt via telephone after 8am.

## 2021-03-08 ENCOUNTER — Other Ambulatory Visit: Payer: Self-pay

## 2021-03-08 ENCOUNTER — Ambulatory Visit (INDEPENDENT_AMBULATORY_CARE_PROVIDER_SITE_OTHER): Payer: Medicare PPO | Admitting: Family Medicine

## 2021-03-08 ENCOUNTER — Encounter (INDEPENDENT_AMBULATORY_CARE_PROVIDER_SITE_OTHER): Payer: Self-pay | Admitting: Family Medicine

## 2021-03-08 VITALS — BP 118/76 | HR 75 | Temp 97.6°F | Ht 63.0 in | Wt 195.0 lb

## 2021-03-08 DIAGNOSIS — Z6841 Body Mass Index (BMI) 40.0 and over, adult: Secondary | ICD-10-CM

## 2021-03-08 DIAGNOSIS — G4733 Obstructive sleep apnea (adult) (pediatric): Secondary | ICD-10-CM | POA: Diagnosis not present

## 2021-03-08 DIAGNOSIS — E1165 Type 2 diabetes mellitus with hyperglycemia: Secondary | ICD-10-CM

## 2021-03-09 NOTE — Progress Notes (Signed)
Chief Complaint:   OBESITY Pamela Love is here to discuss her progress with her obesity treatment plan along with follow-up of her obesity related diagnoses. Pamela Love is on the Category 2 Plan and states she is following her eating plan approximately 70% of the time. Pamela Love states she is doing cardio and strength training 60 minutes 1-2 times per week.  Today's visit was #: 23 Starting weight: 277 lbs Starting date: 03/17/2020 Today's weight: 195 lbs Today's date: 03/08/2021 Total lbs lost to date: 82 Total lbs lost since last in-office visit: 6  Interim History: Pamela Love had a few birthday parties and then also had a heart cath for persistent angina. She is still satisfied on category 2. Lunch is often Fairlife protein shake, good culture cottage cheese, 4 triscuits, and peanut butter. She doesn't eat many snacks, except occasional Yasso bars.  Subjective:   1. Type 2 diabetes mellitus with hyperglycemia, without long-term current use of insulin (HCC) Pamela Love is having a difficult time getting Ozempic due to supply issue. Fasting blood sugars run 113-151 (some indulgent eating).  2. OSA (obstructive sleep apnea) Pamela Love is struggling with wearing the mask over night.  Assessment/Plan:   1. Type 2 diabetes mellitus with hyperglycemia, without long-term current use of insulin (HCC) Pamela Love will continue Ozempic. Pt will discuss Mounjaro coverage with insurance. Good blood sugar control is important to decrease the likelihood of diabetic complications such as nephropathy, neuropathy, limb loss, blindness, coronary artery disease, and death. Intensive lifestyle modification including diet, exercise and weight loss are the first line of treatment for diabetes.   2. OSA (obstructive sleep apnea) Intensive lifestyle modifications are the first line treatment for this issue. We discussed several lifestyle modifications today and she will continue to work on diet, exercise and weight loss efforts. We will  continue to monitor. Orders and follow up as documented in patient record. Follow up with Dr. Nicholaus Bloom.  3. Obesity with current BMI of 34.6  Pamela Love is currently in the action stage of change. As such, her goal is to continue with weight loss efforts. She has agreed to the Category 2 Plan and keeping a food journal and adhering to recommended goals of 400-500 calories and 35+ grams protein at supper.   Exercise goals:  As is  Behavioral modification strategies: increasing lean protein intake, meal planning and cooking strategies, keeping healthy foods in the home, and planning for success.  Pamela Love has agreed to follow-up with our clinic in 2 weeks. She was informed of the importance of frequent follow-up visits to maximize her success with intensive lifestyle modifications for her multiple health conditions.   Objective:   Blood pressure 118/76, pulse 75, temperature 97.6 F (36.4 C), height 5\' 3"  (1.6 m), weight 195 lb (88.5 kg), SpO2 97 %. Body mass index is 34.54 kg/m.  General: Cooperative, alert, well developed, in no acute distress. HEENT: Conjunctivae and lids unremarkable. Cardiovascular: Regular rhythm.  Lungs: Normal work of breathing. Neurologic: No focal deficits.   Lab Results  Component Value Date   CREATININE 1.02 (H) 01/26/2021   BUN 28 (H) 01/26/2021   NA 140 01/26/2021   K 4.6 01/26/2021   CL 103 01/26/2021   CO2 23 01/26/2021   Lab Results  Component Value Date   ALT 25 04/21/2020   AST 25 04/21/2020   ALKPHOS 104 04/21/2020   BILITOT 0.5 04/21/2020   Lab Results  Component Value Date   HGBA1C 7.0 (A) 12/10/2020   HGBA1C 6.3 (A) 09/02/2020  HGBA1C 6.5 (A) 05/27/2020   HGBA1C 7.9 (H) 04/21/2020   HGBA1C 8.0 (A) 02/20/2020   No results found for: INSULIN Lab Results  Component Value Date   TSH 1.71 05/30/2018   Lab Results  Component Value Date   CHOL 134 12/16/2020   HDL 41 12/16/2020   LDLCALC 68 12/16/2020   TRIG 142 12/16/2020   CHOLHDL  3.3 12/16/2020   Lab Results  Component Value Date   VD25OH 63.9 04/21/2020   Lab Results  Component Value Date   WBC 7.0 01/26/2021   HGB 13.8 01/26/2021   HCT 41.6 01/26/2021   MCV 92 01/26/2021   PLT 229 01/26/2021   Attestation Statements:   Reviewed by clinician on day of visit: allergies, medications, problem list, medical history, surgical history, family history, social history, and previous encounter notes.  Time spent on visit including pre-visit chart review and post-visit care and charting was 15 minutes.   Edmund Hilda, CMA, am acting as transcriptionist for Reuben Likes, MD.  I have reviewed the above documentation for accuracy and completeness, and I agree with the above. - Reuben Likes, MD

## 2021-03-10 ENCOUNTER — Ambulatory Visit (HOSPITAL_BASED_OUTPATIENT_CLINIC_OR_DEPARTMENT_OTHER): Payer: Medicare PPO | Admitting: Cardiology

## 2021-03-10 ENCOUNTER — Other Ambulatory Visit: Payer: Self-pay

## 2021-03-10 ENCOUNTER — Encounter (HOSPITAL_BASED_OUTPATIENT_CLINIC_OR_DEPARTMENT_OTHER): Payer: Self-pay | Admitting: Cardiology

## 2021-03-10 VITALS — BP 122/80 | HR 88 | Ht 63.0 in | Wt 198.6 lb

## 2021-03-10 DIAGNOSIS — Z7189 Other specified counseling: Secondary | ICD-10-CM | POA: Diagnosis not present

## 2021-03-10 DIAGNOSIS — E66812 Obesity, class 2: Secondary | ICD-10-CM

## 2021-03-10 DIAGNOSIS — E1169 Type 2 diabetes mellitus with other specified complication: Secondary | ICD-10-CM

## 2021-03-10 DIAGNOSIS — E669 Obesity, unspecified: Secondary | ICD-10-CM

## 2021-03-10 DIAGNOSIS — G4733 Obstructive sleep apnea (adult) (pediatric): Secondary | ICD-10-CM

## 2021-03-10 DIAGNOSIS — Z794 Long term (current) use of insulin: Secondary | ICD-10-CM

## 2021-03-10 DIAGNOSIS — E782 Mixed hyperlipidemia: Secondary | ICD-10-CM

## 2021-03-10 DIAGNOSIS — I10 Essential (primary) hypertension: Secondary | ICD-10-CM

## 2021-03-10 DIAGNOSIS — Z713 Dietary counseling and surveillance: Secondary | ICD-10-CM

## 2021-03-10 DIAGNOSIS — I2510 Atherosclerotic heart disease of native coronary artery without angina pectoris: Secondary | ICD-10-CM | POA: Diagnosis not present

## 2021-03-10 DIAGNOSIS — Z7182 Exercise counseling: Secondary | ICD-10-CM | POA: Diagnosis not present

## 2021-03-10 MED ORDER — CARVEDILOL 3.125 MG PO TABS: 3.1250 mg | ORAL_TABLET | Freq: Two times a day (BID) | ORAL | 3 refills | Status: DC

## 2021-03-10 MED ORDER — FUROSEMIDE 40 MG PO TABS: 40.0000 mg | ORAL_TABLET | ORAL | 6 refills | Status: DC

## 2021-03-10 MED ORDER — NITROGLYCERIN 0.4 MG SL SUBL: 0.4000 mg | SUBLINGUAL_TABLET | SUBLINGUAL | 1 refills | Status: DC | PRN

## 2021-03-10 MED ORDER — RANOLAZINE ER 1000 MG PO TB12: 1000.0000 mg | ORAL_TABLET | Freq: Two times a day (BID) | ORAL | 3 refills | Status: DC

## 2021-03-10 MED ORDER — POTASSIUM CHLORIDE CRYS ER 20 MEQ PO TBCR: 20.0000 meq | EXTENDED_RELEASE_TABLET | ORAL | 6 refills | Status: DC

## 2021-03-10 MED ORDER — CLOPIDOGREL BISULFATE 75 MG PO TABS: 75.0000 mg | ORAL_TABLET | Freq: Every day | ORAL | 3 refills | Status: DC

## 2021-03-10 NOTE — Progress Notes (Signed)
Cardiology Office Note:    Date:  03/10/2021   ID:  Pamela Love, DOB [birth year], MRN [id number]  PCP:  Mayra Neer, MD  Cardiologist:  Buford Dresser, MD  Referring MD: Mayra Neer, MD   CC: establish care/prior patient of Dr. Harriet Masson   History of Present Illness:    Pamela Love is a 70 y.o. female with a hx of arthritis, CAD, CKD stage IIIa, diabetes type 2,  hyperlipidemia, hypertension, obesity, and OSA on CPAP who is seen for follow-up. She was previously a patient of Dr. Harriet Masson, and has requested to establish care with me.     Cardiovascular risk factors: Prior clinical ASCVD: Angina attacks, CAD. Last cath from 01/2021 reviewed together today Comorbid conditions: CKD stage IIIa, diabetes type 2, hyperlipidemia, hypertension Metabolic syndrome/Obesity: BMI 35, working on weight loss with [facility] Exercise level: Exercises at cardiac rehab facility (past year), usually for 1 hour and then becomes fatigued. Still becomes fatigued and short of breath climbing a hill. She is participating in [facility]. Has lost 33 pounds since last September. Current diet: Special occasion glass of wine or frozen margarita.  She is accompanied by her husband and granddaughter. We discussed at length types of angina and the effect her attacks may have on her heart. From May 11-June 27th she had 5 anginal attacks. Since her stent placement in 01/2021 she denies any recurring attacks.  Sometimes it is difficult for her to know if a symptom is related to arthritis, indigestion, or angina. Usually her angina will occur at rest, and may wake her up. Typically, she will feel chest pain underneath her breast as well as jaw pain that "feels like her teeth ache." For treatment, she takes nitroglycerin every 5 minutes (all 3 doses) and rests until the symptoms dissipate.  Occasionally she monitors blood pressure at home. Previously on antihypertensives  but these were discontinued due to her blood pressure bottoming out.  Lasix is in her regimen three times daily for LE edema. She has her COVID booster vaccinations.  She does have a CPAP but dislikes using it.  She denies any palpitations. No lightheadedness, headaches, syncope, orthopnea, or PND. No blood in her stool or hematuria.   Past Medical History:  Diagnosis Date   Abnormal nuclear stress test 01/02/2020   Arthritis    Bilateral leg edema 01/02/2020   Bursitis    hips   C. difficile colitis 12/2017   CAD (coronary artery disease) 01/06/2020   CKD (chronic kidney disease), stage III (HCC)    Complication of anesthesia    "spinal did not work with second c section"   Diabetes mellitus ([place]) 11/19/2015   Diabetes mellitus, type 2 ([place])    diet controlled - has Rx for Glymipride but has not started taking yet   Dyslipidemia 10/21/2014   GERD (gastroesophageal reflux disease)    History of nonmelanoma skin cancer    History of skin cancer    Hypercholesterolemia 12/11/2019   Hyperlipidemia    Hypertension    Hypertensive disorder 10/21/2014   Lower extremity edema    Mixed hyperlipidemia 01/02/2020   OAB (overactive bladder)    Obesity (BMI 30-39.9) 05/12/2020   OSA (obstructive sleep apnea) 05/12/2020   Osteoarthritis of right knee 05/15/2015   Osteoarthrosis, unspecified whether generalized or localized, involving lower leg 10/21/2014   Osteoporosis    Over weight    Primary osteoarthritis of left knee 12/18/2014   S/P knee replacement 12/18/2014  S/P total knee replacement using cement 05/15/2015   Sleep apnea    Tachycardia 12/11/2019    Past Surgical History:  Procedure Laterality Date   ABDOMINAL HYSTERECTOMY  2005   CATARACT EXTRACTION     CESAREAN SECTION     x2   colonscopy      CORONARY STENT INTERVENTION N/A 01/06/2020   Procedure: CORONARY STENT INTERVENTION;  Surgeon: Lorretta Harp, MD;  Location: [facility];  Service: Cardiovascular;  Laterality:  N/A;   EYE SURGERY     bilat cataract surgery 2015   INTRAVASCULAR PRESSURE WIRE/FFR STUDY N/A 01/06/2020   Procedure: INTRAVASCULAR PRESSURE WIRE/FFR STUDY;  Surgeon: Lorretta Harp, MD;  Location: [facility];  Service: Cardiovascular;  Laterality: N/A;   LEFT HEART CATH AND CORONARY ANGIOGRAPHY N/A 01/06/2020   Procedure: LEFT HEART CATH AND CORONARY ANGIOGRAPHY;  Surgeon: Lorretta Harp, MD;  Location: [facility];  Service: Cardiovascular;  Laterality: N/A;   LEFT HEART CATH AND CORONARY ANGIOGRAPHY N/A 01/29/2021   Procedure: LEFT HEART CATH AND CORONARY ANGIOGRAPHY;  Surgeon: Jettie Booze, MD;  Location: [facility];  Service: Cardiovascular;  Laterality: N/A;   TOTAL KNEE ARTHROPLASTY Left 12/18/2014   Procedure: TOTAL LEFT KNEE ARTHROPLASTY;  Surgeon: Sydnee Cabal, MD;  Location: [facility];  Service: Orthopedics;  Laterality: Left;   TOTAL KNEE ARTHROPLASTY Right 05/15/2015   Procedure: RIGHT TOTAL KNEE ARTHROPLASTY;  Surgeon: Sydnee Cabal, MD;  Location: [facility];  Service: Orthopedics;  Laterality: Right;   UTERINE FIBROID SURGERY      Current Medications: Current Outpatient Medications on File Prior to Visit  Medication Sig   Accu-Chek Softclix Lancets lancets USE TO CHECK BLOOD SUGAR TWICE DAILY   acetaminophen (TYLENOL) 650 MG CR tablet Take 1,300 mg by mouth at bedtime.   aspirin EC 81 MG tablet Take 1 tablet (81 mg total) by mouth in the morning.   Blood Glucose Monitoring Suppl (ACCU-CHEK GUIDE ME) w/Device KIT 1 each by Does not apply route 2 (two) times daily. E11.9   Cholecalciferol (VITAMIN D) 2000 UNITS CAPS Take 2,000 Units by mouth every morning.    Coenzyme Q10 (COQ10) 200 MG CAPS Take 200 mg by mouth in the morning.   DULoxetine (CYMBALTA) 20 MG capsule Take 40 mg by mouth at bedtime.    glucose blood (ACCU-CHEK GUIDE) test strip Use to check BS 2x a day   insulin glargine, 2 Unit Dial, (TOUJEO MAX SOLOSTAR) 300 UNIT/ML Solostar Pen  Inject 46 Units into the skin in the morning. And 4 mm pen needles 2/day   Insulin Pen Needle (BD PEN NEEDLE NANO U/F) 32G X 4 MM MISC 1 each by Does not apply route daily.   Multiple Vitamin (MULTIVITAMIN WITH MINERALS) TABS tablet Take 1 tablet by mouth every morning.    Omega-3 Fatty Acids (RA FISH OIL) 1400 MG CPDR Take 1,400 mg by mouth 2 (two) times daily.   rosuvastatin (CRESTOR) 40 MG tablet Take 1 tablet (40 mg total) by mouth in the morning.   saccharomyces boulardii (FLORASTOR) 250 MG capsule Take 250 mg by mouth in the morning.   Semaglutide, 2 MG/DOSE, (OZEMPIC, 2 MG/DOSE,) 8 MG/3ML SOPN Inject 2 mg into the skin every Tuesday.   trospium (SANCTURA) 20 MG tablet Take 20 mg by mouth 2 (two) times daily.   vitamin C (ASCORBIC ACID) 250 MG tablet Take 250 mg by mouth in the morning.   No current facility-administered medications on file  prior to visit.     Allergies:   Colchicine, Diclofenac, Imdur [isosorbide nitrate], Janumet [sitagliptin-metformin hcl], Septra [sulfamethoxazole-trimethoprim], Trulicity [dulaglutide], Zetia [ezetimibe], Imipramine, and Tape   Social History   Tobacco Use   Smoking status: Never   Smokeless tobacco: Never  Vaping Use   Vaping Use: Never used  Substance Use Topics   Alcohol use: Yes    Comment: rare   Drug use: No    Family History: family history includes Cancer in her mother; Congenital heart disease in her son; Diabetes in her maternal grandfather; Heart attack in her father, maternal grandfather, paternal uncle, and sister; Heart disease in her father and mother; Hyperlipidemia in her father and mother; Hypertension in her father and mother; Sleep apnea in her father; Sudden death in her father.  ROS:   Please see the history of present illness.  Additional pertinent ROS: Constitutional: Positive for fatigue. Negative for chills, fever, night sweats, unintentional weight loss  HENT: Negative for ear pain and hearing loss.   Eyes:  Negative for loss of vision and eye pain.  Respiratory: Positive for shortness of breath. Negative for cough, sputum, wheezing.   Cardiovascular: See HPI. Gastrointestinal: Negative for abdominal pain, melena, and hematochezia.  Genitourinary: Negative for dysuria and hematuria.  Musculoskeletal: Negative for falls and myalgias.  Skin: Positive for LE edema. Negative for itching and rash.  Neurological: Negative for focal weakness, focal sensory changes and loss of consciousness.  Endo/Heme/Allergies: Does not bruise/bleed easily.     EKGs/Labs/Other Studies Reviewed:    The following studies were reviewed today:  LHC 01/29/2021: Prox LAD to Mid LAD lesion is 40-50% stenosed. Angiographic appeareance improved compared to prior cath in 2021 when DFR was negative. Patent RCA stents. The left ventricular systolic function is normal. LV end diastolic pressure is normal. The left ventricular ejection fraction is 55-65% by visual estimate. There is no aortic valve stenosis.   Continue medical therapy.  Echo 01/07/2021:  1. Left ventricular ejection fraction, by estimation, is 60 to 65%. The  left ventricle has normal function. The left ventricle has no regional  wall motion abnormalities. Left ventricular diastolic parameters are  consistent with Grade I diastolic  dysfunction (impaired relaxation).   2. Right ventricular systolic function is normal. The right ventricular  size is normal. There is normal pulmonary artery systolic pressure.   3. The mitral valve is normal in structure. Mild mitral valve  regurgitation. No evidence of mitral stenosis.   4. The aortic valve is normal in structure. There is mild calcification  of the aortic valve. There is mild thickening of the aortic valve. Aortic  valve regurgitation is not visualized. No aortic stenosis is present.   5. The inferior vena cava is normal in size with greater than 50%  respiratory variability, suggesting right atrial  pressure of 3 mmHg.   Lexiscan Myoview 01/01/2020: The left ventricular ejection fraction is normal (55-65%). Nuclear stress EF: 62%. There was no ST segment deviation noted during stress. Defect 1: There is a small defect of mild severity present in the basal anterior and mid anterior location. Findings consistent with ischemia. This is an intermediate risk study. Moderate area of mild ischemia involving LAD teritory. Normal LVEF.  EKG:  EKG is personally reviewed.   03/10/2021: not ordered  Recent Labs: 04/21/2020: ALT 25 01/26/2021: BUN 28; Creatinine, Ser 1.02; Hemoglobin 13.8; Magnesium 2.0; Platelets 229; Potassium 4.6; Sodium 140  Recent Lipid Panel    Component Value Date/Time   CHOL 134  12/16/2020 0957   TRIG 142 12/16/2020 0957   HDL 41 12/16/2020 0957   CHOLHDL 3.3 12/16/2020 0957   LDLCALC 68 12/16/2020 0957    Physical Exam:    VS:  BP 122/80   Pulse 88   Ht '5\' 3"'  (1.6 m)   Wt 198 lb 9.6 oz (90.1 kg)   SpO2 98%   BMI 35.18 kg/m     Wt Readings from Last 3 Encounters:  03/10/21 198 lb 9.6 oz (90.1 kg)  03/08/21 195 lb (88.5 kg)  01/29/21 202 lb (91.6 kg)    GEN: Well nourished, well developed in no acute distress HEENT: Normal, moist mucous membranes NECK: No JVD CARDIAC: regular rhythm, normal S1 and S2, no rubs or gallops. No murmur. VASCULAR: Radial and DP pulses 2+ bilaterally. No carotid bruits RESPIRATORY:  Clear to auscultation without rales, wheezing or rhonchi  ABDOMEN: Soft, non-tender, non-distended MUSCULOSKELETAL:  Ambulates independently SKIN: Warm and dry, no edema NEUROLOGIC:  Alert and oriented x 3. No focal neuro deficits noted. PSYCHIATRIC:  Normal affect    ASSESSMENT:    1. Coronary artery disease involving native coronary artery of native heart without angina pectoris   2. Type 2 diabetes mellitus with other specified complication, with long-term current use of insulin ([place])   3. Essential hypertension   4. Obesity, Class II,  BMI 35-39.9   5. Mixed hyperlipidemia   6. OSA (obstructive sleep apnea)   7. Cardiac risk counseling   8. Counseling on health promotion and disease prevention   9. Nutritional counseling   10. Exercise counseling    PLAN:    CAD, with prior PCI Mixed hyperlipidemia -no angina currently. We discussed both cardiac and noncardiac causes of chest pain at length. She had no chest pain prior to her stents, and she has had none since her cath. Her pain has been nitro responsive before. Discussed that spasm vs. GI/smooth muscle can respond to nitro -Aspirin 81 mg -statin: continue rosuvastatin 40 mg daily -LDL goal <70, last LDL 68 -antianginals: beta blocker (carvedilol), ranolazine. Not on isosorbide as it made her hypotensive -Given type II diabetes and CAD, recommend either SGLT2 inhibitor or GLP1-RA, she is on semaglutide -has prescription for sublingual nitroglycerin, counseled on lifespan of this medication -counseled on diet recommendations and AHA exercise guidelines, below -risk factor modification, as below -counseled on red flag warning signs that need immediate medical attention   Hypertension:  -at goal of <130/80, continue current meds  OSA: -continue CPAP. Asking about alternatives, deferred to Dr. Claiborne Billings  Obesity, BMI 35 Cardiac risk counseling and prevention recommendations: -recommend heart healthy/Mediterranean diet, with whole grains, fruits, vegetable, fish, lean meats, nuts, and olive oil. Limit salt. -recommend moderate walking, 3-5 times/week for 30-50 minutes each session. Aim for at least 150 minutes.week. Goal should be pace of 3 miles/hours, or walking 1.5 miles in 30 minutes -recommend avoidance of tobacco products. Avoid excess alcohol. -ASCVD risk score: The ASCVD Risk score Mikey Bussing DC Jr., et al., 2013) failed to calculate for the following reasons:   The patient has a prior MI or stroke diagnosis    Plan for follow up: 6 months or sooner as  needed.  Buford Dresser, MD, PhD, [facility]    Medication Adjustments/Labs and Tests Ordered: Current medicines are reviewed at length with the patient today.  Concerns regarding medicines are outlined above.   No orders of the defined types were placed in this encounter.  Meds ordered this  encounter  Medications   carvedilol (COREG) 3.125 MG tablet    Sig: Take 1 tablet (3.125 mg total) by mouth 2 (two) times daily with a meal.    Dispense:  180 tablet    Refill:  3   clopidogrel (PLAVIX) 75 MG tablet    Sig: Take 1 tablet (75 mg total) by mouth daily with breakfast.    Dispense:  90 tablet    Refill:  3   furosemide (LASIX) 40 MG tablet    Sig: Take 1 tablet (40 mg total) by mouth 3 (three) times a week. Tuesdays, Thursdays & Saturdays in the morning    Dispense:  30 tablet    Refill:  6   nitroGLYCERIN (NITROSTAT) 0.4 MG SL tablet    Sig: Place 1 tablet (0.4 mg total) under the tongue every 5 (five) minutes x 3 doses as needed for chest pain.    Dispense:  30 tablet    Refill:  1   potassium chloride SA (KLOR-CON M20) 20 MEQ tablet    Sig: Take 1 tablet (20 mEq total) by mouth 3 (three) times a week. Tuesdays, Thursdays & Saturdays in the morning    Dispense:  30 tablet    Refill:  6   ranolazine (RANEXA) 1000 MG SR tablet    Sig: Take 1 tablet (1,000 mg total) by mouth 2 (two) times daily.    Dispense:  180 tablet    Refill:  3    Patient Instructions  Medication Instructions:  Your Physician recommend you continue on your current medication as directed.    *If you need a refill on your cardiac medications before your next appointment, please call your pharmacy*   Lab Work: None ordered today   Testing/Procedures: None ordered today   Follow-Up: At [facility], you and your health needs are our priority.  As part of our continuing mission to provide you with exceptional heart care, we have created designated Provider Care  Teams.  These Care Teams include your primary Cardiologist (physician) and Advanced Practice Providers (APPs -  Physician Assistants and Nurse Practitioners) who all work together to provide you with the care you need, when you need it.  We recommend signing up for the patient portal called "MyChart".  Sign up information is provided on this After Visit Summary.  MyChart is used to connect with patients for Virtual Visits (Telemedicine).  Patients are able to view lab/test results, encounter notes, upcoming appointments, etc.  Non-urgent messages can be sent to your provider as well.   To learn more about what you can do with MyChart, go to [email or web address].    Your next appointment:   6 month(s)  The format for your next appointment:   In Person  Provider:   Buford Dresser, MD      [facility] St[facility],acting as a scribe for Buford Dresser, MD.,have documented all relevant documentation on the behalf of Buford Dresser, MD,as directed by  Buford Dresser, MD while in the presence of Buford Dresser, MD.  I, Buford Dresser, MD, have reviewed all documentation for this visit. The documentation on 03/10/21 for the exam, diagnosis, procedures, and orders are all accurate and complete.   Signed, Buford Dresser, MD PhD 03/10/2021 12:14 PM    [place]

## 2021-03-10 NOTE — Patient Instructions (Signed)

## 2021-03-18 DIAGNOSIS — Z85828 Personal history of other malignant neoplasm of skin: Secondary | ICD-10-CM | POA: Diagnosis not present

## 2021-03-18 DIAGNOSIS — L578 Other skin changes due to chronic exposure to nonionizing radiation: Secondary | ICD-10-CM | POA: Diagnosis not present

## 2021-03-18 DIAGNOSIS — G4733 Obstructive sleep apnea (adult) (pediatric): Secondary | ICD-10-CM | POA: Diagnosis not present

## 2021-03-18 DIAGNOSIS — L821 Other seborrheic keratosis: Secondary | ICD-10-CM | POA: Diagnosis not present

## 2021-03-18 DIAGNOSIS — L814 Other melanin hyperpigmentation: Secondary | ICD-10-CM | POA: Diagnosis not present

## 2021-03-18 DIAGNOSIS — Z86018 Personal history of other benign neoplasm: Secondary | ICD-10-CM | POA: Diagnosis not present

## 2021-03-18 DIAGNOSIS — D225 Melanocytic nevi of trunk: Secondary | ICD-10-CM | POA: Diagnosis not present

## 2021-03-23 ENCOUNTER — Other Ambulatory Visit: Payer: Self-pay

## 2021-03-23 ENCOUNTER — Ambulatory Visit (INDEPENDENT_AMBULATORY_CARE_PROVIDER_SITE_OTHER): Payer: Medicare PPO | Admitting: Adult Health

## 2021-03-23 ENCOUNTER — Encounter (INDEPENDENT_AMBULATORY_CARE_PROVIDER_SITE_OTHER): Payer: Self-pay | Admitting: Adult Health

## 2021-03-23 VITALS — BP 110/68 | HR 85 | Temp 97.9°F | Ht 63.0 in | Wt 194.0 lb

## 2021-03-23 DIAGNOSIS — I209 Angina pectoris, unspecified: Secondary | ICD-10-CM | POA: Diagnosis not present

## 2021-03-23 DIAGNOSIS — Z6841 Body Mass Index (BMI) 40.0 and over, adult: Secondary | ICD-10-CM

## 2021-03-23 DIAGNOSIS — E1165 Type 2 diabetes mellitus with hyperglycemia: Secondary | ICD-10-CM

## 2021-03-23 MED ORDER — VICTOZA 18 MG/3ML ~~LOC~~ SOPN: 1.2000 mg | PEN_INJECTOR | Freq: Every day | SUBCUTANEOUS | 0 refills | Status: DC

## 2021-03-24 ENCOUNTER — Encounter (INDEPENDENT_AMBULATORY_CARE_PROVIDER_SITE_OTHER): Payer: Self-pay | Admitting: Adult Health

## 2021-03-24 NOTE — Progress Notes (Signed)
Chief Complaint:   OBESITY June is here to discuss her progress with her obesity treatment plan along with follow-up of her obesity related diagnoses. Parthenia is on the Category 2 Plan and keeping a food journal and adhering to recommended goals of 400-500 calories and 35+ grams of protein at supper and states she is following her eating plan approximately 70% of the time. [place]sace states she is walking for 60 minutes 2-3 times per week.  Today's visit was #: 24 Starting weight: 227 lbs Starting date: 03/17/2020 Today's weight: 194 lbs Today's date: 03/23/2021 Total lbs lost to date: 33 lbs Total lbs lost since last in-office visit: 1 lb  Interim History: Clotiel reports several fasting readings in the 70s.  She was able to consume high carbohydrate foods/fluids and did not experience significant hypoglycemia symptoms with the low glucose readings.   She is on Ozempic 2 mg (managed by the clinic) and Toujeo 46 units by Endocrinology.  Last dose of Ozempic 2 mg this morning, 03/23/2021, unable to find refill due to national supply shortage.  Subjective:   1. Type 2 diabetes mellitus with hyperglycemia, without long-term current use of insulin (HCC) Recent fasting BG 125, 71, 129, 90, 74, 106, 92, 96.  Ozempic increased to 2 mg on 11/30/2020 due to increased cravings for sweets.  Endo/Dr. Everardo All decreased Toujeo from 50 units to 46 units on 12/10/2020.   Last dose of Ozempic 2 mg this morning, 03/23/2021, unable to find refill due to national supply shortage.  Lab Results  Component Value Date   HGBA1C 7.0 (A) 12/10/2020   HGBA1C 6.3 (A) 09/02/2020   HGBA1C 6.5 (A) 05/27/2020   Lab Results  Component Value Date   LDLCALC 68 12/16/2020   CREATININE 1.02 (H) 01/26/2021   2. Angina pectoris (HCC) 03/20/2021 - Experienced sig exacerbation of angina after walking long distances at [facility]. BP prior to nitro 147/80  3 doses of nitro, repeat BP 120/69.   Took almost an hour for full  symptom resolution.   CAD status post stent to RCA on 01/06/2020. Denies any sx's at present. She is followed by Cardiology/ Dr. Cristal Deer  Assessment/Plan:   1. Type 2 diabetes mellitus with hyperglycemia, without long-term current use of insulin (HCC) Decrease Toujeo from 46 units to 42 units.  Start Victoza 1.2 mg daily.  Dispense 3 mL.  She will start it on 03/30/2021.  Follow-up with Endo/Dr. Everardo All.  2. Angina pectoris (HCC) Discussed red flag symptoms and to seek immediate medical care if they develop.  Pt. Verbalized understanding/agreement. Recommend follow-up with Cardiology is angina persists.  3. Obesity with current BMI of 34.4  Kamari is currently in the action stage of change. As such, her goal is to continue with weight loss efforts. She has agreed to the Category 2 Plan and keeping a food journal and adhering to recommended goals of 400-500 calories and 35+ grams of protein at supper.   Exercise goals:  As is.  Behavioral modification strategies: increasing lean protein intake, decreasing simple carbohydrates, meal planning and cooking strategies, keeping healthy foods in the home, and planning for success.  Decrease Toujeo from 46 units to 42 units.  Change from Ozempic to Victoza 1.2 mg - first dose on 03/30/2021.  Mykira has agreed to follow-up with our clinic in 3 weeks with Dr. Earlene Plater. She was informed of the importance of frequent follow-up visits to maximize her success with intensive lifestyle modifications for her multiple health conditions.  Objective:   Blood pressure 110/68, pulse 85, temperature 97.9 F (36.6 C), height 5\' 3"  (1.6 m), weight 194 lb (88 kg), SpO2 96 %. Body mass index is 34.37 kg/m.  General: Cooperative, alert, well developed, in no acute distress. HEENT: Conjunctivae and lids unremarkable. Cardiovascular: Regular rhythm.  Lungs: Normal work of breathing. Neurologic: No focal deficits.   Lab Results  Component Value Date    CREATININE 1.02 (H) 01/26/2021   BUN 28 (H) 01/26/2021   NA 140 01/26/2021   K 4.6 01/26/2021   CL 103 01/26/2021   CO2 23 01/26/2021   Lab Results  Component Value Date   ALT 25 04/21/2020   AST 25 04/21/2020   ALKPHOS 104 04/21/2020   BILITOT 0.5 04/21/2020   Lab Results  Component Value Date   HGBA1C 7.0 (A) 12/10/2020   HGBA1C 6.3 (A) 09/02/2020   HGBA1C 6.5 (A) 05/27/2020   HGBA1C 7.9 (H) 04/21/2020   HGBA1C 8.0 (A) 02/20/2020   Lab Results  Component Value Date   TSH 1.71 05/30/2018   Lab Results  Component Value Date   CHOL 134 12/16/2020   HDL 41 12/16/2020   LDLCALC 68 12/16/2020   TRIG 142 12/16/2020   CHOLHDL 3.3 12/16/2020   Lab Results  Component Value Date   VD25OH 63.9 04/21/2020   Lab Results  Component Value Date   WBC 7.0 01/26/2021   HGB 13.8 01/26/2021   HCT 41.6 01/26/2021   MCV 92 01/26/2021   PLT 229 01/26/2021   Obesity Behavioral Intervention:   Approximately 15 minutes were spent on the discussion below.  ASK: We discussed the diagnosis of obesity with Solomia today and Magdalen agreed to give Stark Bray permission to discuss obesity behavioral modification therapy today.  ASSESS: Kyannah has the diagnosis of obesity and her BMI today is 34.4. Alizabeth is in the action stage of change.   ADVISE: Milton was educated on the multiple health risks of obesity as well as the benefit of weight loss to improve her health. She was advised of the need for long term treatment and the importance of lifestyle modifications to improve her current health and to decrease her risk of future health problems.  AGREE: Multiple dietary modification options and treatment options were discussed and Micaela agreed to follow the recommendations documented in the above note.  ARRANGE: Trysta was educated on the importance of frequent visits to treat obesity as outlined per CMS and USPSTF guidelines and agreed to schedule her next follow up appointment today.  Attestation  Statements:   Reviewed by clinician on day of visit: allergies, medications, problem list, medical history, surgical history, family history, social history, and previous encounter notes.  I, Stark Bray, CMA, am acting as Insurance claims handler for Energy manager, NP.  I have reviewed the above documentation for accuracy and completeness, and I agree with the above. -  Maryjane Benedict d. Jazzmen Restivo, NP-C

## 2021-03-25 ENCOUNTER — Other Ambulatory Visit: Payer: Self-pay | Admitting: Endocrinology

## 2021-03-25 ENCOUNTER — Other Ambulatory Visit (INDEPENDENT_AMBULATORY_CARE_PROVIDER_SITE_OTHER): Payer: Self-pay

## 2021-03-25 DIAGNOSIS — E1169 Type 2 diabetes mellitus with other specified complication: Secondary | ICD-10-CM

## 2021-03-25 DIAGNOSIS — Z794 Long term (current) use of insulin: Secondary | ICD-10-CM

## 2021-03-25 MED ORDER — BD PEN NEEDLE NANO U/F 32G X 4 MM MISC
1.0000 | Freq: Every day | 0 refills | Status: DC
Start: 2021-03-25 — End: 2021-04-01

## 2021-03-26 ENCOUNTER — Telehealth: Payer: Self-pay | Admitting: Endocrinology

## 2021-03-26 NOTE — Telephone Encounter (Signed)
Pt calling in stating that she needs a refill on Insulin Pen Needle (BD PEN NEEDLE NANO U/F) 32G X 4 MM MISC    [id number] - [place], [place] - [address]

## 2021-03-29 ENCOUNTER — Ambulatory Visit: Payer: Medicare PPO | Admitting: Cardiology

## 2021-03-31 ENCOUNTER — Encounter (INDEPENDENT_AMBULATORY_CARE_PROVIDER_SITE_OTHER): Payer: Self-pay | Admitting: Adult Health

## 2021-04-01 ENCOUNTER — Other Ambulatory Visit (INDEPENDENT_AMBULATORY_CARE_PROVIDER_SITE_OTHER): Payer: Self-pay

## 2021-04-01 DIAGNOSIS — Z794 Long term (current) use of insulin: Secondary | ICD-10-CM

## 2021-04-01 DIAGNOSIS — E1169 Type 2 diabetes mellitus with other specified complication: Secondary | ICD-10-CM

## 2021-04-01 MED ORDER — BD PEN NEEDLE NANO U/F 32G X 4 MM MISC
1.0000 | Freq: Every day | 0 refills | Status: DC
Start: 2021-04-01 — End: 2021-04-12

## 2021-04-01 NOTE — Telephone Encounter (Signed)
Last OV with Katy 

## 2021-04-12 ENCOUNTER — Ambulatory Visit: Payer: Medicare PPO | Admitting: Endocrinology

## 2021-04-12 ENCOUNTER — Other Ambulatory Visit: Payer: Self-pay

## 2021-04-12 VITALS — BP 138/74 | HR 79 | Ht 63.0 in | Wt 198.8 lb

## 2021-04-12 DIAGNOSIS — N1831 Chronic kidney disease, stage 3a: Secondary | ICD-10-CM | POA: Diagnosis not present

## 2021-04-12 DIAGNOSIS — E1169 Type 2 diabetes mellitus with other specified complication: Secondary | ICD-10-CM

## 2021-04-12 DIAGNOSIS — E1122 Type 2 diabetes mellitus with diabetic chronic kidney disease: Secondary | ICD-10-CM | POA: Diagnosis not present

## 2021-04-12 DIAGNOSIS — Z794 Long term (current) use of insulin: Secondary | ICD-10-CM

## 2021-04-12 LAB — POCT GLYCOSYLATED HEMOGLOBIN (HGB A1C): Hemoglobin A1C: 5.8 % — AB (ref 4.0–5.6)

## 2021-04-12 MED ORDER — TOUJEO MAX SOLOSTAR 300 UNIT/ML ~~LOC~~ SOPN: 32.0000 [IU] | PEN_INJECTOR | Freq: Every morning | SUBCUTANEOUS | 3 refills | Status: DC

## 2021-04-12 MED ORDER — BD PEN NEEDLE NANO U/F 32G X 4 MM MISC: 1.0000 | Freq: Every day | 3 refills | Status: DC

## 2021-04-12 NOTE — Progress Notes (Signed)
Subjective:    Patient ID: Pamela Love, female    DOB: [birth year], 70 y.o.   MRN: [id number]  HPI Pt returns for f/u of diabetes mellitus: DM type: Insulin-requiring type 2 Dx'ed: 6712 Complications: CRI and CAD.   Therapy: insulin since 2019, and Ozempic.   GDM: never.  DKA: never.  Severe hypoglycemia: never.  Pancreatitis: never.  Other: she also took insulin for 6 months, in 2016; CRI, vaginitis, and edema limit rx options; she did not tolerate trulicity (nausea), or repaglinide (diarrhea); she declines multiple daily injections.: [facility] rx's Ozempic, then Victoza.    Interval history: she brings a record of her cbg's which I have reviewed today.  cbg varies from 71-170.   pt states she feels well in general.  She takes 42 units/d.  She will increase Victoza to 1.8/d, tomorrow.   Past Medical History:  Diagnosis Date   Abnormal nuclear stress test 01/02/2020   Arthritis    Bilateral leg edema 01/02/2020   Bursitis    hips   C. difficile colitis 12/2017   CAD (coronary artery disease) 01/06/2020   CKD (chronic kidney disease), stage III (HCC)    Complication of anesthesia    "spinal did not work with second c section"   Diabetes mellitus ([place]) 11/19/2015   Diabetes mellitus, type 2 ([place])    diet controlled - has Rx for Glymipride but has not started taking yet   Dyslipidemia 10/21/2014   GERD (gastroesophageal reflux disease)    History of nonmelanoma skin cancer    History of skin cancer    Hypercholesterolemia 12/11/2019   Hyperlipidemia    Hypertension    Hypertensive disorder 10/21/2014   Lower extremity edema    Mixed hyperlipidemia 01/02/2020   OAB (overactive bladder)    Obesity (BMI 30-39.9) 05/12/2020   OSA (obstructive sleep apnea) 05/12/2020   Osteoarthritis of right knee 05/15/2015   Osteoarthrosis, unspecified whether generalized or localized, involving lower leg 10/21/2014   Osteoporosis    Over weight    Primary osteoarthritis of left knee 12/18/2014   S/P  knee replacement 12/18/2014   S/P total knee replacement using cement 05/15/2015   Sleep apnea    Tachycardia 12/11/2019    Past Surgical History:  Procedure Laterality Date   ABDOMINAL HYSTERECTOMY  2005   CATARACT EXTRACTION     CESAREAN SECTION     x2   colonscopy      CORONARY STENT INTERVENTION N/A 01/06/2020   Procedure: CORONARY STENT INTERVENTION;  Surgeon: Lorretta Harp, MD;  Location: [facility];  Service: Cardiovascular;  Laterality: N/A;   EYE SURGERY     bilat cataract surgery 2015   INTRAVASCULAR PRESSURE WIRE/FFR STUDY N/A 01/06/2020   Procedure: INTRAVASCULAR PRESSURE WIRE/FFR STUDY;  Surgeon: Lorretta Harp, MD;  Location: [facility];  Service: Cardiovascular;  Laterality: N/A;   LEFT HEART CATH AND CORONARY ANGIOGRAPHY N/A 01/06/2020   Procedure: LEFT HEART CATH AND CORONARY ANGIOGRAPHY;  Surgeon: Lorretta Harp, MD;  Location: [facility];  Service: Cardiovascular;  Laterality: N/A;   LEFT HEART CATH AND CORONARY ANGIOGRAPHY N/A 01/29/2021   Procedure: LEFT HEART CATH AND CORONARY ANGIOGRAPHY;  Surgeon: Jettie Booze, MD;  Location: [facility];  Service: Cardiovascular;  Laterality: N/A;   TOTAL KNEE ARTHROPLASTY Left 12/18/2014   Procedure: TOTAL LEFT KNEE ARTHROPLASTY;  Surgeon: Sydnee Cabal, MD;  Location: [facility];  Service: Orthopedics;  Laterality: Left;   TOTAL KNEE ARTHROPLASTY  Right 05/15/2015   Procedure: RIGHT TOTAL KNEE ARTHROPLASTY;  Surgeon: Sydnee Cabal, MD;  Location: [facility];  Service: Orthopedics;  Laterality: Right;   UTERINE FIBROID SURGERY      Social History   Socioeconomic History   Marital status: Married    Spouse name: Not on file   Number of children: Not on file   Years of education: Not on file   Highest education level: Not on file  Occupational History   Occupation: retired  Tobacco Use   Smoking status: Never   Smokeless tobacco: Never  Vaping Use   Vaping Use: Never used  Substance  and Sexual Activity   Alcohol use: Yes    Comment: rare   Drug use: No   Sexual activity: Not on file  Other Topics Concern   Not on file  Social History Narrative   Not on file   Social Determinants of Health   Financial Resource Strain: Not on file  Food Insecurity: Not on file  Transportation Needs: Not on file  Physical Activity: Not on file  Stress: Not on file  Social Connections: Not on file  Intimate Partner Violence: Not on file    Current Outpatient Medications on File Prior to Visit  Medication Sig Dispense Refill   Accu-Chek Softclix Lancets lancets USE TO CHECK BLOOD SUGAR TWICE DAILY 200 each 3   acetaminophen (TYLENOL) 650 MG CR tablet Take 1,300 mg by mouth at bedtime.     aspirin EC 81 MG tablet Take 1 tablet (81 mg total) by mouth in the morning.     Blood Glucose Monitoring Suppl (ACCU-CHEK GUIDE ME) w/Device KIT 1 each by Does not apply route 2 (two) times daily. E11.9 1 kit 0   carvedilol (COREG) 3.125 MG tablet Take 1 tablet (3.125 mg total) by mouth 2 (two) times daily with a meal. 180 tablet 3   Cholecalciferol (VITAMIN D) 2000 UNITS CAPS Take 2,000 Units by mouth every morning.      clopidogrel (PLAVIX) 75 MG tablet Take 1 tablet (75 mg total) by mouth daily with breakfast. 90 tablet 3   Coenzyme Q10 (COQ10) 200 MG CAPS Take 200 mg by mouth in the morning.     DULoxetine (CYMBALTA) 20 MG capsule Take 40 mg by mouth at bedtime.      furosemide (LASIX) 40 MG tablet Take 1 tablet (40 mg total) by mouth 3 (three) times a week. Tuesdays, Thursdays & Saturdays in the morning 30 tablet 6   glucose blood (ACCU-CHEK GUIDE) test strip Use to check BS 2x a day 200 each 12   liraglutide (VICTOZA) 18 MG/3ML SOPN Inject 1.2 mg into the skin daily. 9 each 0   Multiple Vitamin (MULTIVITAMIN WITH MINERALS) TABS tablet Take 1 tablet by mouth every morning.      nitroGLYCERIN (NITROSTAT) 0.4 MG SL tablet Place 1 tablet (0.4 mg total) under the tongue every 5 (five) minutes x  3 doses as needed for chest pain. 30 tablet 1   Omega-3 Fatty Acids (RA FISH OIL) 1400 MG CPDR Take 1,400 mg by mouth 2 (two) times daily.     potassium chloride SA (KLOR-CON M20) 20 MEQ tablet Take 1 tablet (20 mEq total) by mouth 3 (three) times a week. Tuesdays, Thursdays & Saturdays in the morning 30 tablet 6   ranolazine (RANEXA) 1000 MG SR tablet Take 1 tablet (1,000 mg total) by mouth 2 (two) times daily. 180 tablet 3   rosuvastatin (CRESTOR) 40 MG tablet Take 1  tablet (40 mg total) by mouth in the morning.     saccharomyces boulardii (FLORASTOR) 250 MG capsule Take 250 mg by mouth in the morning.     trospium (SANCTURA) 20 MG tablet Take 20 mg by mouth 2 (two) times daily.  3   vitamin C (ASCORBIC ACID) 250 MG tablet Take 250 mg by mouth in the morning.     No current facility-administered medications on file prior to visit.    Allergies  Allergen Reactions   Colchicine     Stomach cramps   Diclofenac     Oral causes stomach cramps   Imdur [Isosorbide Nitrate] Other (See Comments)    hypotensive   Isosorbide Other (See Comments)    Causes Hypotension   Janumet [Sitagliptin-Metformin Hcl] Nausea And Vomiting    Lightheaded    Septra [Sulfamethoxazole-Trimethoprim] Itching   Trulicity [Dulaglutide] Diarrhea and Nausea And Vomiting   Zetia [Ezetimibe]     Muscle pain   Imipramine Rash   Tape Rash    Paper tape ok to use     Family History  Problem Relation Age of Onset   Diabetes Maternal Grandfather    Heart attack Maternal Grandfather    Heart disease Mother    Hypertension Mother    Hyperlipidemia Mother    Cancer Mother    Heart attack Father    Hypertension Father    Hyperlipidemia Father    Heart disease Father    Sudden death Father    Sleep apnea Father    Heart attack Sister    Heart attack Paternal Uncle    Congenital heart disease Son     BP 138/74 (BP Location: Right Arm, Patient Position: Sitting, Cuff Size: Large)   Pulse 79   Ht _0  (1.6 m)    Wt 198 lb 12.8 oz (90.2 kg)   SpO2 94%   BMI 35.22 kg/m    Review of Systems She denies hypoglycemia.  She has lost 33 lbs so far.  Nausea is mild.     Objective:   Physical Exam VITAL SIGNS:  See vs page GENERAL: no distress Pulses: dorsalis pedis intact bilat.   MSK: no deformity of the feet CV: 1+ bilat leg edema Skin:  no ulcer on the feet.  normal color and temp on the feet. Neuro: sensation is intact to touch on the feet.     Lab Results  Component Value Date   HGBA1C 5.8 (A) 04/12/2021   Lab Results  Component Value Date   CREATININE 1.02 (H) 01/26/2021   BUN 28 (H) 01/26/2021   NA 140 01/26/2021   K 4.6 01/26/2021   CL 103 01/26/2021   CO2 23 01/26/2021      Assessment & Plan:  Insulin-requiring type 2 DM: overcontrolled.   Patient Instructions  Please reduce the Toujeo to 32 units each morning, and increase the Victoza tomorrow as planned check your blood sugar twice a day.  vary the time of day when you check, between before the 3 meals, and at bedtime.  also check if you have symptoms of your blood sugar being too high or too low.  please keep a record of the readings and bring it to your next appointment here (or you can bring the meter itself).  You can write it on any piece of paper.  please call us sooner if your blood sugar goes below 70, or if you have a lot of readings over 200.   Please come back for a  follow-up appointment in 3 months.

## 2021-04-12 NOTE — Patient Instructions (Addendum)
Please reduce the Toujeo to 32 units each morning, and increase the Victoza tomorrow as planned check your blood sugar twice a day.  vary the time of day when you check, between before the 3 meals, and at bedtime.  also check if you have symptoms of your blood sugar being too high or too low.  please keep a record of the readings and bring it to your next appointment here (or you can bring the meter itself).  You can write it on any piece of paper.  please call us sooner if your blood sugar goes below 70, or if you have a lot of readings over 200.   Please come back for a follow-up appointment in 3 months.

## 2021-04-15 ENCOUNTER — Ambulatory Visit (INDEPENDENT_AMBULATORY_CARE_PROVIDER_SITE_OTHER): Payer: Medicare PPO | Admitting: Family Medicine

## 2021-04-15 DIAGNOSIS — M545 Low back pain, unspecified: Secondary | ICD-10-CM | POA: Diagnosis not present

## 2021-04-15 DIAGNOSIS — E669 Obesity, unspecified: Secondary | ICD-10-CM | POA: Diagnosis not present

## 2021-04-15 DIAGNOSIS — M2550 Pain in unspecified joint: Secondary | ICD-10-CM | POA: Diagnosis not present

## 2021-04-15 DIAGNOSIS — M159 Polyosteoarthritis, unspecified: Secondary | ICD-10-CM | POA: Diagnosis not present

## 2021-04-15 DIAGNOSIS — Z6835 Body mass index (BMI) 35.0-35.9, adult: Secondary | ICD-10-CM | POA: Diagnosis not present

## 2021-04-15 DIAGNOSIS — M1120 Other chondrocalcinosis, unspecified site: Secondary | ICD-10-CM | POA: Diagnosis not present

## 2021-04-16 ENCOUNTER — Encounter (INDEPENDENT_AMBULATORY_CARE_PROVIDER_SITE_OTHER): Payer: Self-pay | Admitting: Family Medicine

## 2021-04-16 DIAGNOSIS — U071 COVID-19: Secondary | ICD-10-CM | POA: Diagnosis not present

## 2021-04-16 DIAGNOSIS — R059 Cough, unspecified: Secondary | ICD-10-CM | POA: Diagnosis not present

## 2021-04-18 DIAGNOSIS — G4733 Obstructive sleep apnea (adult) (pediatric): Secondary | ICD-10-CM | POA: Diagnosis not present

## 2021-04-19 ENCOUNTER — Other Ambulatory Visit: Payer: Self-pay

## 2021-04-19 ENCOUNTER — Encounter (INDEPENDENT_AMBULATORY_CARE_PROVIDER_SITE_OTHER): Payer: Self-pay

## 2021-04-19 ENCOUNTER — Telehealth (INDEPENDENT_AMBULATORY_CARE_PROVIDER_SITE_OTHER): Payer: Medicare PPO | Admitting: Adult Health

## 2021-04-22 ENCOUNTER — Telehealth: Payer: Self-pay | Admitting: Cardiology

## 2021-04-22 NOTE — Telephone Encounter (Signed)
Agree with recommendations on carvedilol, would also hold lasix until she is recovered and blood pressure consistently improved.

## 2021-04-22 NOTE — Telephone Encounter (Signed)
Spoke to patient she stated she is getting over covid, for the past 4 days B/P low.Today B/P 80/60 pulse 90.Stated she already took coreg 3.125 mg this morning.Advised to hold pm dose today.Advised do not take coreg until systolic B/P over 100.Advised to stay well hydrated. I will send message to Dr.Christopher for advice.

## 2021-04-22 NOTE — Telephone Encounter (Signed)
Spoke to patient Dr.Christopher's advice given.Advised to call back if B/P continues to be low.

## 2021-04-22 NOTE — Telephone Encounter (Signed)
Pt c/o BP issue: STAT if pt c/o blurred vision, one-sided weakness or slurred speech  1. What are your last 5 BP readings? 87/64, 88/67, 87/66  2. Are you having any other symptoms (ex. Dizziness, headache, blurred vision, passed out)? dizziness  3. What is your BP issue? Pt is just getting over covid and is stating that her bp is low... please advise

## 2021-04-26 NOTE — Telephone Encounter (Signed)
Patient calling back. She states since stopping her medications the 23rd her BP has been higher. She states on the 24th it was 112/82, yesterday 120/79, and today 120/81. She says she is not having any symptoms.

## 2021-04-26 NOTE — Telephone Encounter (Signed)
Thanks. These numbers are much better. Once her blood pressure is consistently >130/80, I would restart medications.

## 2021-04-26 NOTE — Telephone Encounter (Signed)
Routed to Dr. Cristal Deer and RN for awareness

## 2021-04-27 NOTE — Telephone Encounter (Signed)
Pt updated with MD's recommendations and verbalized understanding.  

## 2021-04-28 DIAGNOSIS — G4733 Obstructive sleep apnea (adult) (pediatric): Secondary | ICD-10-CM | POA: Diagnosis not present

## 2021-05-03 ENCOUNTER — Other Ambulatory Visit: Payer: Self-pay

## 2021-05-03 ENCOUNTER — Encounter (INDEPENDENT_AMBULATORY_CARE_PROVIDER_SITE_OTHER): Payer: Self-pay | Admitting: Adult Health

## 2021-05-03 ENCOUNTER — Ambulatory Visit (INDEPENDENT_AMBULATORY_CARE_PROVIDER_SITE_OTHER): Payer: Medicare PPO | Admitting: Adult Health

## 2021-05-03 VITALS — BP 138/81 | HR 82 | Temp 97.8°F | Ht 63.0 in | Wt 196.0 lb

## 2021-05-03 DIAGNOSIS — E1169 Type 2 diabetes mellitus with other specified complication: Secondary | ICD-10-CM

## 2021-05-03 DIAGNOSIS — I10 Essential (primary) hypertension: Secondary | ICD-10-CM

## 2021-05-03 DIAGNOSIS — Z794 Long term (current) use of insulin: Secondary | ICD-10-CM | POA: Diagnosis not present

## 2021-05-03 DIAGNOSIS — Z6841 Body Mass Index (BMI) 40.0 and over, adult: Secondary | ICD-10-CM

## 2021-05-04 NOTE — Progress Notes (Signed)
Chief Complaint:   OBESITY Pamela Love is here to discuss her progress with her obesity treatment plan along with follow-up of her obesity related diagnoses. Pamela Love is on the Category 2 Plan and keeping a food journal and adhering to recommended goals of 400-500 calories and 35+ grams of protein at supper and states she is following her eating plan approximately 70% of the time. Pamela Love states she is not exercising regularly.  Today's visit was #: 25 Starting weight: 227 lbs Starting date: 03/17/2020 Today's weight: 196 lbs Today's date: 05/03/2021 Total lbs lost to date: 31 lbs Total lbs lost since last in-office visit: 0  Interim History: On 04/12/2021, A1c was 5.8 - Endocrinologist decrease Toujeo from 42 units to 32 units and increased Victoza to 1.8 mg. Since adjustment, fasting BG 116, 125, 133, 108; postprandial 85 - felt increased fatigue, however no other sx's of hypoglycemia.  Subjective:   1. Type 2 diabetes mellitus with other specified complication, with long-term current use of insulin (HCC) On 04/12/2021, A1c was 5.8 - Endocrinologist decrease Toujeo from 42 units to 32 units and increased Victoza to 1.8 mg. Since adjustment, fasting BG 116, 125, 133, 108; postprandial 85 - felt increased fatigue, however no other sx's of hypoglycemia.  2. Essential hypertension She denies dizziness with position changes.   She denies acute cardiac symptoms.   Cardiology instructed her to hold potassium/Lasix/Coreg until she has 4 consecutive days of >130/80 - has yet to achieve this parameter.  Assessment/Plan:   1. Type 2 diabetes mellitus with other specified complication, with long-term current use of insulin (HCC) Continue antidiabetic regimen per Endocrinology.  2. Essential hypertension Resume antihypertensives per Cardiology.  3. Obesity with current BMI of 34.8  Pamela Love is currently in the action stage of change. As such, her goal is to continue with weight loss efforts. She has  agreed to the Category 2 Plan and keeping a food journal and adhering to recommended goals of 400-500 calories and 35 grams of protein.   Handout:  Recipe Guide II.  Exercise goals: All adults should avoid inactivity. Some physical activity is better than none, and adults who participate in any amount of physical activity gain some health benefits. For substantial health benefits, adults should do at least 150 minutes (2 hours and 30 minutes) a week of moderate-intensity, or 75 minutes (1 hour and 15 minutes) a week of vigorous-intensity aerobic physical activity, or an equivalent combination of moderate- and vigorous-intensity aerobic activity. Aerobic activity should be performed in episodes of at least 10 minutes, and preferably, it should be spread throughout the week.  Behavioral modification strategies: increasing lean protein intake, decreasing simple carbohydrates, meal planning and cooking strategies, keeping healthy foods in the home, and planning for success.  Pamela Love has agreed to follow-up with our clinic in 3 weeks. She was informed of the importance of frequent follow-up visits to maximize her success with intensive lifestyle modifications for her multiple health conditions.   Objective:   Blood pressure 138/81, pulse 82, temperature 97.8 F (36.6 C), height 5\' 3"  (1.6 m), weight 196 lb (88.9 kg), SpO2 98 %. Body mass index is 34.72 kg/m.  General: Cooperative, alert, well developed, in no acute distress. HEENT: Conjunctivae and lids unremarkable. Cardiovascular: Regular rhythm.  Lungs: Normal work of breathing. Neurologic: No focal deficits.   Lab Results  Component Value Date   CREATININE 1.02 (H) 01/26/2021   BUN 28 (H) 01/26/2021   NA 140 01/26/2021   K 4.6 01/26/2021  CL 103 01/26/2021   CO2 23 01/26/2021   Lab Results  Component Value Date   ALT 25 04/21/2020   AST 25 04/21/2020   ALKPHOS 104 04/21/2020   BILITOT 0.5 04/21/2020   Lab Results  Component  Value Date   HGBA1C 5.8 (A) 04/12/2021   HGBA1C 7.0 (A) 12/10/2020   HGBA1C 6.3 (A) 09/02/2020   HGBA1C 6.5 (A) 05/27/2020   HGBA1C 7.9 (H) 04/21/2020   Lab Results  Component Value Date   TSH 1.71 05/30/2018   Lab Results  Component Value Date   CHOL 134 12/16/2020   HDL 41 12/16/2020   LDLCALC 68 12/16/2020   TRIG 142 12/16/2020   CHOLHDL 3.3 12/16/2020   Lab Results  Component Value Date   VD25OH 63.9 04/21/2020   Lab Results  Component Value Date   WBC 7.0 01/26/2021   HGB 13.8 01/26/2021   HCT 41.6 01/26/2021   MCV 92 01/26/2021   PLT 229 01/26/2021   Attestation Statements:   Reviewed by clinician on day of visit: allergies, medications, problem list, medical history, surgical history, family history, social history, and previous encounter notes.  Time spent on visit including pre-visit chart review and post-visit care and charting was 30 minutes.   I, Insurance claims handler, CMA, am acting as Energy manager for William Hamburger, NP.  I have reviewed the above documentation for accuracy and completeness, and I agree with the above. -  Chan Sheahan d. Reynoldo Mainer, NP-C

## 2021-05-18 DIAGNOSIS — G4733 Obstructive sleep apnea (adult) (pediatric): Secondary | ICD-10-CM | POA: Diagnosis not present

## 2021-05-21 DIAGNOSIS — L570 Actinic keratosis: Secondary | ICD-10-CM | POA: Diagnosis not present

## 2021-05-21 DIAGNOSIS — L918 Other hypertrophic disorders of the skin: Secondary | ICD-10-CM | POA: Diagnosis not present

## 2021-05-21 DIAGNOSIS — L821 Other seborrheic keratosis: Secondary | ICD-10-CM | POA: Diagnosis not present

## 2021-05-21 DIAGNOSIS — Z23 Encounter for immunization: Secondary | ICD-10-CM | POA: Diagnosis not present

## 2021-05-22 DIAGNOSIS — J209 Acute bronchitis, unspecified: Secondary | ICD-10-CM | POA: Diagnosis not present

## 2021-05-22 DIAGNOSIS — H1032 Unspecified acute conjunctivitis, left eye: Secondary | ICD-10-CM | POA: Diagnosis not present

## 2021-05-24 ENCOUNTER — Ambulatory Visit (INDEPENDENT_AMBULATORY_CARE_PROVIDER_SITE_OTHER): Payer: Medicare PPO | Admitting: Adult Health

## 2021-05-25 ENCOUNTER — Encounter (INDEPENDENT_AMBULATORY_CARE_PROVIDER_SITE_OTHER): Payer: Self-pay | Admitting: Adult Health

## 2021-05-25 ENCOUNTER — Other Ambulatory Visit: Payer: Self-pay

## 2021-05-25 ENCOUNTER — Ambulatory Visit (INDEPENDENT_AMBULATORY_CARE_PROVIDER_SITE_OTHER): Payer: Medicare PPO | Admitting: Adult Health

## 2021-05-25 VITALS — BP 113/69 | HR 89 | Temp 98.5°F | Ht 63.0 in | Wt 196.0 lb

## 2021-05-25 DIAGNOSIS — E1169 Type 2 diabetes mellitus with other specified complication: Secondary | ICD-10-CM

## 2021-05-25 DIAGNOSIS — Z794 Long term (current) use of insulin: Secondary | ICD-10-CM

## 2021-05-25 DIAGNOSIS — Z6841 Body Mass Index (BMI) 40.0 and over, adult: Secondary | ICD-10-CM

## 2021-05-25 MED ORDER — TIRZEPATIDE 2.5 MG/0.5ML ~~LOC~~ SOAJ: 2.5000 mg | SUBCUTANEOUS | 0 refills | Status: DC

## 2021-05-26 NOTE — Progress Notes (Signed)
Chief Complaint:   OBESITY Pamela Love is here to discuss her progress with her obesity treatment plan along with follow-up of her obesity related diagnoses. Pamela Love is on the Category 2 Plan and keeping a food journal and adhering to recommended goals of 400-500 calories and 35 grams of protein at supper and states she is following her eating plan approximately 50% of the time. Pamela Love states she is doing cardio/water aerobics for 60 minutes 2 times per week.  Today's visit was #: 26 Starting weight: 227 lbs Starting date: 03/17/2020 Today's weight: 195 lbs Today's date: 05/25/2021 Total lbs lost to date: 32 lbs Total lbs lost since last in-office visit: 1 lb  Interim History: Pamela Love resumed all maintenance medications - Coreg, Lasix, potassium. Her best friend recently passed away - funeral yesterday (70 year old - Pamela Love).  They have been friends for >50 years.  Subjective:   1. Type 2 diabetes mellitus with other specified complication, with long-term current use of insulin (HCC) On 04/12/2021, A1c was 5.8 - at goal. She continues to experience polyphagia with Victoza 1.8 mg. She would like to change treatment - Mounjaro.  Assessment/Plan:   1. Type 2 diabetes mellitus with other specified complication, with long-term current use of insulin (HCC) Stop Victoza. Start Mounjaro 2.5 mg subcutaneously once weekly, as per below.  - Start tirzepatide [facility]) 2.5 MG/0.5ML Pen; Inject 2.5 mg into the skin once a week.  Dispense: 2 mL; Refill: 0  2. Obesity with current BMI of 34.6  Pamela Love is currently in the action stage of change. As such, her goal is to continue with weight loss efforts. She has agreed to the Category 2 Plan.   Exercise goals:  As is.  Behavioral modification strategies: increasing lean protein intake, decreasing simple carbohydrates, meal planning and cooking strategies, keeping healthy foods in the home, and planning for success.  Pamela Love has agreed to follow-up  with our clinic in 2 weeks. She was informed of the importance of frequent follow-up visits to maximize her success with intensive lifestyle modifications for her multiple health conditions.   Objective:   Blood pressure 113/69, pulse 89, temperature 98.5 F (36.9 C), height 5\' 3"  (1.6 m), weight 196 lb (88.9 kg), SpO2 96 %. Body mass index is 34.72 kg/m.  General: Cooperative, alert, well developed, in no acute distress. HEENT: Conjunctivae and lids unremarkable. Cardiovascular: Regular rhythm.  Lungs: Normal work of breathing. Neurologic: No focal deficits.   Lab Results  Component Value Date   CREATININE 1.02 (H) 01/26/2021   BUN 28 (H) 01/26/2021   NA 140 01/26/2021   K 4.6 01/26/2021   CL 103 01/26/2021   CO2 23 01/26/2021   Lab Results  Component Value Date   ALT 25 04/21/2020   AST 25 04/21/2020   ALKPHOS 104 04/21/2020   BILITOT 0.5 04/21/2020   Lab Results  Component Value Date   HGBA1C 5.8 (A) 04/12/2021   HGBA1C 7.0 (A) 12/10/2020   HGBA1C 6.3 (A) 09/02/2020   HGBA1C 6.5 (A) 05/27/2020   HGBA1C 7.9 (H) 04/21/2020   Lab Results  Component Value Date   TSH 1.71 05/30/2018   Lab Results  Component Value Date   CHOL 134 12/16/2020   HDL 41 12/16/2020   LDLCALC 68 12/16/2020   TRIG 142 12/16/2020   CHOLHDL 3.3 12/16/2020   Lab Results  Component Value Date   VD25OH 63.9 04/21/2020   Lab Results  Component Value Date   WBC 7.0 01/26/2021   HGB 13.8  01/26/2021   HCT 41.6 01/26/2021   MCV 92 01/26/2021   PLT 229 01/26/2021   Obesity Behavioral Intervention:   Approximately 15 minutes were spent on the discussion below.  ASK: We discussed the diagnosis of obesity with Pamela Love today and Pamela Love agreed to give [place] permission to discuss obesity behavioral modification therapy today.  ASSESS: Pamela Love has the diagnosis of obesity and her BMI today is 34.6. Pamela Love is in the action stage of change.   ADVISE: Pamela Love was educated on the multiple health risks  of obesity as well as the benefit of weight loss to improve her health. She was advised of the need for long term treatment and the importance of lifestyle modifications to improve her current health and to decrease her risk of future health problems.  AGREE: Multiple dietary modification options and treatment options were discussed and Pamela Love agreed to follow the recommendations documented in the above note.  ARRANGE: Pamela Love was educated on the importance of frequent visits to treat obesity as outlined per CMS and USPSTF guidelines and agreed to schedule her next follow up appointment today.  Attestation Statements:   Reviewed by clinician on day of visit: allergies, medications, problem list, medical history, surgical history, family history, social history, and previous encounter notes.  I, Insurance claims handler, CMA, am acting as Energy manager for William Hamburger, NP.  I have reviewed the above documentation for accuracy and completeness, and I agree with the above. -  Jacarie Pate d. Modean Mccullum, NP-C

## 2021-05-28 ENCOUNTER — Telehealth: Payer: Self-pay | Admitting: Pharmacy Technician

## 2021-05-28 ENCOUNTER — Other Ambulatory Visit (HOSPITAL_COMMUNITY): Payer: Self-pay

## 2021-05-28 NOTE — Telephone Encounter (Signed)
Patient Advocate Encounter   Received notification/fax from [facility] that prior authorization for BD NANO 2 is not required.  Letter states the pt can get their prescription at any [facility] participating pharmacy without a PA.   Per Test Claim: too soon to fill.

## 2021-06-03 DIAGNOSIS — Z7901 Long term (current) use of anticoagulants: Secondary | ICD-10-CM | POA: Diagnosis not present

## 2021-06-03 DIAGNOSIS — K5900 Constipation, unspecified: Secondary | ICD-10-CM | POA: Diagnosis not present

## 2021-06-03 DIAGNOSIS — Z86010 Personal history of colonic polyps: Secondary | ICD-10-CM | POA: Diagnosis not present

## 2021-06-07 ENCOUNTER — Ambulatory Visit (INDEPENDENT_AMBULATORY_CARE_PROVIDER_SITE_OTHER): Payer: Medicare PPO | Admitting: Family Medicine

## 2021-06-07 ENCOUNTER — Other Ambulatory Visit: Payer: Self-pay

## 2021-06-07 ENCOUNTER — Encounter (INDEPENDENT_AMBULATORY_CARE_PROVIDER_SITE_OTHER): Payer: Self-pay | Admitting: Family Medicine

## 2021-06-07 VITALS — BP 129/76 | HR 73 | Temp 97.6°F | Ht 63.0 in | Wt 199.0 lb

## 2021-06-07 DIAGNOSIS — Z794 Long term (current) use of insulin: Secondary | ICD-10-CM | POA: Diagnosis not present

## 2021-06-07 DIAGNOSIS — Z6841 Body Mass Index (BMI) 40.0 and over, adult: Secondary | ICD-10-CM | POA: Diagnosis not present

## 2021-06-07 DIAGNOSIS — E1169 Type 2 diabetes mellitus with other specified complication: Secondary | ICD-10-CM

## 2021-06-07 MED ORDER — TIRZEPATIDE 5 MG/0.5ML ~~LOC~~ SOAJ: 5.0000 mg | SUBCUTANEOUS | 1 refills | Status: DC

## 2021-06-07 NOTE — Progress Notes (Signed)
Chief Complaint:   OBESITY Pamela Love is here to discuss her progress with her obesity treatment plan along with follow-up of her obesity related diagnoses. See Medical Weight Management Flowsheet for complete bioelectrical impedance results.  Today's visit was #: 27 Starting weight: 227 lbs Starting date: 03/17/2020 Weight change since last visit: +3 lbs Total lbs lost to date: 28 lbs Total weight loss percentage to date: -12.33%  Nutrition Plan: Category 2 Meal Plan for 40% of the time. Activity: Cardiac Rehab for 120 minutes 2 times per week. Anti-obesity medications: Mounjaro 2.5 mg subcutaneously weekly. Reported side effects: None.  Interim History: Pamela Love says that coming every 2 weeks for visits provides her with better accountability.  She transitioned to [facility] 2.5 mg after her last visit.  Endorses polyphagia and mild increase in blood sugar.  She took her second dose today.  Assessment/Plan:   1. Type 2 diabetes mellitus with other specified complication, with long-term current use of insulin (HCC) Diabetes Mellitus: Improving, but not optimized. Medication: Mounjaro 2.5 mg subcutaneously weekly, Toujeo 32 units in the morning. Issues reviewed: blood sugar goals, complications of diabetes mellitus, hypoglycemia prevention and treatment, exercise, and nutrition.  Plan:  Increase Mounjaro to 5 mg subcutaneously weekly.  Continue Toujeo 32 units (ends in December).  The patient will continue to focus on protein-rich, low simple carbohydrate foods. We reviewed the importance of hydration, regular exercise for stress reduction, and restorative sleep.   Lab Results  Component Value Date   HGBA1C 5.8 (A) 04/12/2021   HGBA1C 7.0 (A) 12/10/2020   HGBA1C 6.3 (A) 09/02/2020   Lab Results  Component Value Date   LDLCALC 68 12/16/2020   CREATININE 1.02 (H) 01/26/2021   - Increase tirzepatide (MOUNJARO) 5 MG/0.5ML Pen; Inject 5 mg into the skin once a week.  Dispense: 6 mL;  Refill: 1  2. Obesity with current BMI of 35.3  Course: [name] is currently in the action stage of change. As such, her goal is to continue with weight loss efforts.   Nutrition goals: She has agreed to the Category 2 Plan.   Exercise goals:  As is.  Behavioral modification strategies: increasing lean protein intake, decreasing simple carbohydrates, increasing vegetables, increasing water intake, and decreasing liquid calories.  [name] has agreed to follow-up with our clinic in 2 weeks. She was informed of the importance of frequent follow-up visits to maximize her success with intensive lifestyle modifications for her multiple health conditions.   Objective:   Blood pressure 129/76, pulse 73, temperature 97.6 F (36.4 C), temperature source Oral, height 5\' 3"  (1.6 m), weight 199 lb (90.3 kg), SpO2 94 %. Body mass index is 35.25 kg/m.  General: Cooperative, alert, well developed, in no acute distress. HEENT: Conjunctivae and lids unremarkable. Cardiovascular: Regular rhythm.  Lungs: Normal work of breathing. Neurologic: No focal deficits.   Lab Results  Component Value Date   CREATININE 1.02 (H) 01/26/2021   BUN 28 (H) 01/26/2021   NA 140 01/26/2021   K 4.6 01/26/2021   CL 103 01/26/2021   CO2 23 01/26/2021   Lab Results  Component Value Date   ALT 25 04/21/2020   AST 25 04/21/2020   ALKPHOS 104 04/21/2020   BILITOT 0.5 04/21/2020   Lab Results  Component Value Date   HGBA1C 5.8 (A) 04/12/2021   HGBA1C 7.0 (A) 12/10/2020   HGBA1C 6.3 (A) 09/02/2020   HGBA1C 6.5 (A) 05/27/2020   HGBA1C 7.9 (H) 04/21/2020   Lab Results  Component Value  Date   TSH 1.71 05/30/2018   Lab Results  Component Value Date   CHOL 134 12/16/2020   HDL 41 12/16/2020   LDLCALC 68 12/16/2020   TRIG 142 12/16/2020   CHOLHDL 3.3 12/16/2020   Lab Results  Component Value Date   VD25OH 63.9 04/21/2020   Lab Results  Component Value Date   WBC 7.0 01/26/2021   HGB 13.8 01/26/2021    HCT 41.6 01/26/2021   MCV 92 01/26/2021   PLT 229 01/26/2021   Attestation Statements:   Reviewed by clinician on day of visit: allergies, medications, problem list, medical history, surgical history, family history, social history, and previous encounter notes.  I, Water quality scientist, CMA, am acting as transcriptionist for Briscoe Deutscher, DO  I have reviewed the above documentation for accuracy and completeness, and I agree with the above. -  Briscoe Deutscher, DO, MS, FAAFP, DABOM - Family and Bariatric Medicine.

## 2021-06-20 ENCOUNTER — Encounter (INDEPENDENT_AMBULATORY_CARE_PROVIDER_SITE_OTHER): Payer: Self-pay

## 2021-06-21 ENCOUNTER — Ambulatory Visit (INDEPENDENT_AMBULATORY_CARE_PROVIDER_SITE_OTHER): Payer: Medicare PPO | Admitting: Physician Assistant

## 2021-06-21 ENCOUNTER — Encounter (INDEPENDENT_AMBULATORY_CARE_PROVIDER_SITE_OTHER): Payer: Self-pay

## 2021-07-05 ENCOUNTER — Other Ambulatory Visit: Payer: Self-pay

## 2021-07-05 ENCOUNTER — Ambulatory Visit (INDEPENDENT_AMBULATORY_CARE_PROVIDER_SITE_OTHER): Payer: Medicare PPO | Admitting: Adult Health

## 2021-07-05 ENCOUNTER — Encounter (INDEPENDENT_AMBULATORY_CARE_PROVIDER_SITE_OTHER): Payer: Self-pay | Admitting: Adult Health

## 2021-07-05 VITALS — BP 135/80 | HR 77 | Temp 98.2°F | Ht 63.0 in | Wt 198.0 lb

## 2021-07-05 DIAGNOSIS — Z794 Long term (current) use of insulin: Secondary | ICD-10-CM | POA: Diagnosis not present

## 2021-07-05 DIAGNOSIS — E1169 Type 2 diabetes mellitus with other specified complication: Secondary | ICD-10-CM

## 2021-07-05 DIAGNOSIS — I209 Angina pectoris, unspecified: Secondary | ICD-10-CM

## 2021-07-05 DIAGNOSIS — Z6841 Body Mass Index (BMI) 40.0 and over, adult: Secondary | ICD-10-CM

## 2021-07-05 MED ORDER — TIRZEPATIDE 7.5 MG/0.5ML ~~LOC~~ SOAJ: 7.5000 mg | SUBCUTANEOUS | 0 refills | Status: DC

## 2021-07-05 NOTE — Progress Notes (Signed)
Chief Complaint:   OBESITY Javeah is here to discuss her progress with her obesity treatment plan along with follow-up of her obesity related diagnoses. Gwyn is on the Category 2 Plan and states she is following her eating plan approximately 40% of the time. Loura states she is doing cardio and dance for 90 minutes 2 times per week.  Today's visit was #: 28 Starting weight: 227 lbs Starting date: 03/17/2020 Today's weight: 198 lbs Today's date: 07/05/2021 Total lbs lost to date: 29 lbs Total lbs lost since last in-office visit: 1 lb  Interim History:  While on Victoza 1.8- Ms. Canion was experiencing polyphagia. Victoza was replaced with Mounjaro on 05/25/2020.  She increased to 5 mg of Mounjaro at last office visit 06/07/2021-3 doses at this strength. She again reports polyphagia.  She is interested in again increasing the Mounjaro dose.  Subjective:   1. Type 2 diabetes mellitus with other specified complication, with long-term current use of insulin (HCC) While on Victoza 1.8- Ms. Mansouri was experiencing polyphagia. Victoza was replaced with Mounjaro on 05/25/2020.  She increased to 5 mg of Mounjaro at last office visit 06/07/2021-3 doses at this strength. She again reports polyphagia.   On 04/12/2021, A1c 5.8 - at goal -  on Mounjaro 5 mg and Toujeo 32 units (previoulsly on 46 units - decreased by Edno/Dr. Everardo All on 04/12/2021.   Ambulatory fasting BG: 131, 157, 98, 135, 124, 119, 151.   Post Prandial BG: 82, 111, 98, 124.  2. Angina pectoris (HCC) On 11/16 - Angina attack - jaw pain - resolved with 3 nitro doses. On 12/2 - Angina attack - jaw pain, pain between breasts, headache, nausea-resolved with 3 nitro doses.  Assessment/Plan:   1. Type 2 diabetes mellitus with other specified complication, with long-term current use of insulin (HCC) Increase and refill Mounjaro 7.5 mg once weekly.   2 more injections at 5 mg, then increase Mounjaro to 7.5 mg.   Continue Toujeo 32 -  follow-up with Endo- next week  - Refill tirzepatide (MOUNJARO) 7.5 MG/0.5ML Pen; Inject 7.5 mg into the skin once a week.  Dispense: 6 mL; Refill: 0  2. Angina pectoris [facility]) Follow-up with Cardiology due to increased frequency/severity of angina sx's.  3. Obesity with current BMI of 35.2 Yuri is currently in the action stage of change. As such, her goal is to continue with weight loss efforts. She has agreed to the Category 2 Plan.   Exercise goals:  As is.  Behavioral modification strategies: increasing lean protein intake, decreasing simple carbohydrates, keeping healthy foods in the home, ways to avoid boredom eating, and planning for success.  Carisma has agreed to follow-up with our clinic in 2 weeks. She was informed of the importance of frequent follow-up visits to maximize her success with intensive lifestyle modifications for her multiple health conditions.   Objective:   Blood pressure 135/80, pulse 77, temperature 98.2 F (36.8 C), height 5\' 3"  (1.6 m), weight 198 lb (89.8 kg), SpO2 99 %. Body mass index is 35.07 kg/m.  General: Cooperative, alert, well developed, in no acute distress. HEENT: Conjunctivae and lids unremarkable. Cardiovascular: Regular rhythm.  Lungs: Normal work of breathing. Neurologic: No focal deficits.   Lab Results  Component Value Date   CREATININE 1.02 (H) 01/26/2021   BUN 28 (H) 01/26/2021   NA 140 01/26/2021   K 4.6 01/26/2021   CL 103 01/26/2021   CO2 23 01/26/2021   Lab Results  Component Value Date  ALT 25 04/21/2020   AST 25 04/21/2020   ALKPHOS 104 04/21/2020   BILITOT 0.5 04/21/2020   Lab Results  Component Value Date   HGBA1C 5.8 (A) 04/12/2021   HGBA1C 7.0 (A) 12/10/2020   HGBA1C 6.3 (A) 09/02/2020   HGBA1C 6.5 (A) 05/27/2020   HGBA1C 7.9 (H) 04/21/2020   No results found for: INSULIN Lab Results  Component Value Date   TSH 1.71 05/30/2018   Lab Results  Component Value Date   CHOL 134 12/16/2020   HDL 41  12/16/2020   LDLCALC 68 12/16/2020   TRIG 142 12/16/2020   CHOLHDL 3.3 12/16/2020   Lab Results  Component Value Date   VD25OH 63.9 04/21/2020   Lab Results  Component Value Date   WBC 7.0 01/26/2021   HGB 13.8 01/26/2021   HCT 41.6 01/26/2021   MCV 92 01/26/2021   PLT 229 01/26/2021   No results found for: IRON, TIBC, FERRITIN  Obesity Behavioral Intervention:   Approximately 15 minutes were spent on the discussion below.  ASK: We discussed the diagnosis of obesity with Kristyana today and Rosemarie agreed to give [place] permission to discuss obesity behavioral modification therapy today.  ASSESS: Arlenys has the diagnosis of obesity and her BMI today is 35.2. Arlean is in the action stage of change.   ADVISE: Kevon was educated on the multiple health risks of obesity as well as the benefit of weight loss to improve her health. She was advised of the need for long term treatment and the importance of lifestyle modifications to improve her current health and to decrease her risk of future health problems.  AGREE: Multiple dietary modification options and treatment options were discussed and Tandi agreed to follow the recommendations documented in the above note.  ARRANGE: Shaunta was educated on the importance of frequent visits to treat obesity as outlined per CMS and USPSTF guidelines and agreed to schedule her next follow up appointment today.  Attestation Statements:   Reviewed by clinician on day of visit: allergies, medications, problem list, medical history, surgical history, family history, social history, and previous encounter notes.  I, [name] Bark, RMA, am acting as Location manager for [name], NP.   I have reviewed the above documentation for accuracy and completeness, and I agree with the above. -  Isamu Trammel d. Leemon Ayala, NP-C

## 2021-07-07 DIAGNOSIS — G4733 Obstructive sleep apnea (adult) (pediatric): Secondary | ICD-10-CM | POA: Diagnosis not present

## 2021-07-07 DIAGNOSIS — E782 Mixed hyperlipidemia: Secondary | ICD-10-CM | POA: Diagnosis not present

## 2021-07-07 DIAGNOSIS — E1122 Type 2 diabetes mellitus with diabetic chronic kidney disease: Secondary | ICD-10-CM | POA: Diagnosis not present

## 2021-07-07 DIAGNOSIS — Z0000 Encounter for general adult medical examination without abnormal findings: Secondary | ICD-10-CM | POA: Diagnosis not present

## 2021-07-07 DIAGNOSIS — M1990 Unspecified osteoarthritis, unspecified site: Secondary | ICD-10-CM | POA: Diagnosis not present

## 2021-07-07 DIAGNOSIS — I129 Hypertensive chronic kidney disease with stage 1 through stage 4 chronic kidney disease, or unspecified chronic kidney disease: Secondary | ICD-10-CM | POA: Diagnosis not present

## 2021-07-07 DIAGNOSIS — I2510 Atherosclerotic heart disease of native coronary artery without angina pectoris: Secondary | ICD-10-CM | POA: Diagnosis not present

## 2021-07-07 DIAGNOSIS — N3281 Overactive bladder: Secondary | ICD-10-CM | POA: Diagnosis not present

## 2021-07-07 DIAGNOSIS — I209 Angina pectoris, unspecified: Secondary | ICD-10-CM | POA: Diagnosis not present

## 2021-07-12 ENCOUNTER — Ambulatory Visit: Payer: Medicare PPO | Admitting: Endocrinology

## 2021-07-12 ENCOUNTER — Other Ambulatory Visit: Payer: Self-pay

## 2021-07-12 VITALS — BP 140/70 | HR 84 | Ht 63.0 in | Wt 202.4 lb

## 2021-07-12 DIAGNOSIS — N1831 Chronic kidney disease, stage 3a: Secondary | ICD-10-CM | POA: Diagnosis not present

## 2021-07-12 DIAGNOSIS — E1122 Type 2 diabetes mellitus with diabetic chronic kidney disease: Secondary | ICD-10-CM

## 2021-07-12 LAB — POCT GLYCOSYLATED HEMOGLOBIN (HGB A1C): Hemoglobin A1C: 6.1 % — AB (ref 4.0–5.6)

## 2021-07-12 MED ORDER — TOUJEO MAX SOLOSTAR 300 UNIT/ML ~~LOC~~ SOPN: 26.0000 [IU] | PEN_INJECTOR | Freq: Every morning | SUBCUTANEOUS | 3 refills | Status: DC

## 2021-07-12 NOTE — Patient Instructions (Addendum)
Please reduce the Toujeo to 26 units each morning. check your blood sugar twice a day.  vary the time of day when you check, between before the 3 meals, and at bedtime.  also check if you have symptoms of your blood sugar being too high or too low.  please keep a record of the readings and bring it to your next appointment here (or you can bring the meter itself).  You can write it on any piece of paper.  please call us sooner if your blood sugar goes below 70, or if you have a lot of readings over 200.   Please come back for a follow-up appointment in 3 months.

## 2021-07-12 NOTE — Progress Notes (Signed)
Subjective:    Patient ID: Pamela Love, female    DOB: 1951/03/03, 70 y.o.   MRN: [id number]  HPI Pt returns for f/u of diabetes mellitus: DM type: Insulin-requiring type 2 Dx'ed: 0973 Complications: CRI and CAD.   Therapy: insulin since 2019, and Mounjaro   GDM: never.  DKA: never.  Severe hypoglycemia: never.  Pancreatitis: never.  Other: she also took insulin for 6 months, in 2016; CRI, vaginitis, and edema limit rx options; she did not tolerate trulicity (nausea), Jardiance, (vaginitis), or repaglinide (diarrhea); she declines multiple daily injections.: [facility] rx's Ozempic, then Victoza.    Interval history: no cbg record, but states cbg varies from 82-150.  pt states she feels well in general.  She takes 32 units/d.  She plans to increase Mounjaro next week.  Past Medical History:  Diagnosis Date   Abnormal nuclear stress test 01/02/2020   Arthritis    Bilateral leg edema 01/02/2020   Bursitis    hips   C. difficile colitis 12/2017   CAD (coronary artery disease) 01/06/2020   CKD (chronic kidney disease), stage III (HCC)    Complication of anesthesia    "spinal did not work with second c section"   Diabetes mellitus ([place]) 11/19/2015   Diabetes mellitus, type 2 ([place])    diet controlled - has Rx for Glymipride but has not started taking yet   Dyslipidemia 10/21/2014   GERD (gastroesophageal reflux disease)    History of nonmelanoma skin cancer    History of skin cancer    Hypercholesterolemia 12/11/2019   Hyperlipidemia    Hypertension    Hypertensive disorder 10/21/2014   Lower extremity edema    Mixed hyperlipidemia 01/02/2020   OAB (overactive bladder)    Obesity (BMI 30-39.9) 05/12/2020   OSA (obstructive sleep apnea) 05/12/2020   Osteoarthritis of right knee 05/15/2015   Osteoarthrosis, unspecified whether generalized or localized, involving lower leg 10/21/2014   Osteoporosis    Over weight    Primary osteoarthritis of left knee 12/18/2014   S/P knee replacement  12/18/2014   S/P total knee replacement using cement 05/15/2015   Sleep apnea    Tachycardia 12/11/2019    Past Surgical History:  Procedure Laterality Date   ABDOMINAL HYSTERECTOMY  2005   CATARACT EXTRACTION     CESAREAN SECTION     x2   colonscopy      CORONARY STENT INTERVENTION N/A 01/06/2020   Procedure: CORONARY STENT INTERVENTION;  Surgeon: Lorretta Harp, MD;  Location: [place] [facility];  Service: Cardiovascular;  Laterality: N/A;   EYE SURGERY     bilat cataract surgery 2015   INTRAVASCULAR PRESSURE WIRE/FFR STUDY N/A 01/06/2020   Procedure: INTRAVASCULAR PRESSURE WIRE/FFR STUDY;  Surgeon: Lorretta Harp, MD;  Location: [place] [facility];  Service: Cardiovascular;  Laterality: N/A;   LEFT HEART CATH AND CORONARY ANGIOGRAPHY N/A 01/06/2020   Procedure: LEFT HEART CATH AND CORONARY ANGIOGRAPHY;  Surgeon: Lorretta Harp, MD;  Location: [place] [facility];  Service: Cardiovascular;  Laterality: N/A;   LEFT HEART CATH AND CORONARY ANGIOGRAPHY N/A 01/29/2021   Procedure: LEFT HEART CATH AND CORONARY ANGIOGRAPHY;  Surgeon: Jettie Booze, MD;  Location: [place] [facility];  Service: Cardiovascular;  Laterality: N/A;   TOTAL KNEE ARTHROPLASTY Left 12/18/2014   Procedure: TOTAL LEFT KNEE ARTHROPLASTY;  Surgeon: Sydnee Cabal, MD;  Location: [facility];  Service: Orthopedics;  Laterality: Left;   TOTAL KNEE ARTHROPLASTY Right 05/15/2015   Procedure: RIGHT TOTAL KNEE  ARTHROPLASTY;  Surgeon: Sydnee Cabal, MD;  Location: [facility];  Service: Orthopedics;  Laterality: Right;   UTERINE FIBROID SURGERY      Social History   Socioeconomic History   Marital status: Married    Spouse name: Not on file   Number of children: Not on file   Years of education: Not on file   Highest education level: Not on file  Occupational History   Occupation: retired  Tobacco Use   Smoking status: Never   Smokeless tobacco: Never  Vaping Use   Vaping Use: Never used  Substance and Sexual  Activity   Alcohol use: Yes    Comment: rare   Drug use: No   Sexual activity: Not on file  Other Topics Concern   Not on file  Social History Narrative   Not on file   Social Determinants of Health   Financial Resource Strain: Not on file  Food Insecurity: Not on file  Transportation Needs: Not on file  Physical Activity: Not on file  Stress: Not on file  Social Connections: Not on file  Intimate Partner Violence: Not on file    Current Outpatient Medications on File Prior to Visit  Medication Sig Dispense Refill   Accu-Chek Softclix Lancets lancets USE TO CHECK BLOOD SUGAR TWICE DAILY 200 each 3   acetaminophen (TYLENOL) 650 MG CR tablet Take 1,300 mg by mouth at bedtime.     aspirin EC 81 MG tablet Take 1 tablet (81 mg total) by mouth in the morning.     Blood Glucose Monitoring Suppl (ACCU-CHEK GUIDE ME) w/Device KIT 1 each by Does not apply route 2 (two) times daily. E11.9 1 kit 0   carvedilol (COREG) 3.125 MG tablet Take 1 tablet (3.125 mg total) by mouth 2 (two) times daily with a meal. 180 tablet 3   Cholecalciferol (VITAMIN D) 2000 UNITS CAPS Take 2,000 Units by mouth every morning.      clopidogrel (PLAVIX) 75 MG tablet Take 1 tablet (75 mg total) by mouth daily with breakfast. 90 tablet 3   Coenzyme Q10 (COQ10) 200 MG CAPS Take 200 mg by mouth in the morning.     DULoxetine (CYMBALTA) 20 MG capsule Take 40 mg by mouth at bedtime.      furosemide (LASIX) 40 MG tablet Take 1 tablet (40 mg total) by mouth 3 (three) times a week. Tuesdays, Thursdays & Saturdays in the morning 30 tablet 6   glucose blood (ACCU-CHEK GUIDE) test strip Use to check BS 2x a day 200 each 12   Insulin Pen Needle (BD PEN NEEDLE NANO U/F) 32G X 4 MM MISC 1 each by Does not apply route daily. 100 each 3   Multiple Vitamin (MULTIVITAMIN WITH MINERALS) TABS tablet Take 1 tablet by mouth every morning.      Omega-3 Fatty Acids (RA FISH OIL) 1400 MG CPDR Take 1,400 mg by mouth 2 (two) times daily.      potassium chloride SA (KLOR-CON M20) 20 MEQ tablet Take 1 tablet (20 mEq total) by mouth 3 (three) times a week. Tuesdays, Thursdays & Saturdays in the morning 30 tablet 6   ranolazine (RANEXA) 1000 MG SR tablet Take 1 tablet (1,000 mg total) by mouth 2 (two) times daily. 180 tablet 3   rosuvastatin (CRESTOR) 40 MG tablet Take 1 tablet (40 mg total) by mouth in the morning.     saccharomyces boulardii (FLORASTOR) 250 MG capsule Take 250 mg by mouth in the morning.  tirzepatide (MOUNJARO) 7.5 MG/0.5ML Pen Inject 7.5 mg into the skin once a week. 6 mL 0   trospium (SANCTURA) 20 MG tablet Take 20 mg by mouth 2 (two) times daily.  3   vitamin C (ASCORBIC ACID) 250 MG tablet Take 250 mg by mouth in the morning.     nitroGLYCERIN (NITROSTAT) 0.4 MG SL tablet Place 1 tablet (0.4 mg total) under the tongue every 5 (five) minutes x 3 doses as needed for chest pain. 30 tablet 1   No current facility-administered medications on file prior to visit.    Allergies  Allergen Reactions   Amoxicillin-Pot Clavulanate     Other reaction(s): vomiting   Aspirin     Other reaction(s): reflux and nervousness   Colchicine     Stomach cramps Other reaction(s): severe GI upset   Diclofenac     Oral causes stomach cramps   Dulaglutide Diarrhea and Nausea And Vomiting    Other reaction(s): diarrhea   Empagliflozin     Other reaction(s): yeast infection   Ezetimibe     Muscle pain Other reaction(s): muscle aches   Imdur [Isosorbide Nitrate] Other (See Comments)    hypotensive   Imipramine Hcl     Other reaction(s): rash   Isosorbide Other (See Comments)    Causes Hypotension   Metformin     Other reaction(s): stomach upset   Repaglinide     Other reaction(s): swelling and GI upset   Sitagliptin-Metformin Hcl Nausea And Vomiting    Lightheaded  Other reaction(s): GI upset   Sulfamethoxazole-Trimethoprim Itching    Other reaction(s): itching   Imipramine Rash   Tape Rash    Paper tape ok to use      Family History  Problem Relation Age of Onset   Diabetes Maternal Grandfather    Heart attack Maternal Grandfather    Heart disease Mother    Hypertension Mother    Hyperlipidemia Mother    Cancer Mother    Heart attack Father    Hypertension Father    Hyperlipidemia Father    Heart disease Father    Sudden death Father    Sleep apnea Father    Heart attack Sister    Heart attack Paternal Uncle    Congenital heart disease Son     BP 140/70   Pulse 84   Ht _0  (1.6 m)   Wt 202 lb 6.4 oz (91.8 kg)   SpO2 96%   BMI 35.85 kg/m    Review of Systems She denies hypoglycemia.      Objective:   Physical Exam VITAL SIGNS:  See vs page GENERAL: no distress Pulses: dorsalis pedis intact bilat.   MSK: no deformity of the feet CV: 1+ bilat leg edema Skin:  no ulcer on the feet.  normal color and temp on the feet. Neuro: sensation is intact to touch on the feet.      Lab Results  Component Value Date   CREATININE 1.02 (H) 01/26/2021   BUN 28 (H) 01/26/2021   NA 140 01/26/2021   K 4.6 01/26/2021   CL 103 01/26/2021   CO2 23 01/26/2021   Lab Results  Component Value Date   HGBA1C 6.1 (A) 07/12/2021      Assessment & Plan:  Insulin-requiring type 2 DM: overcontrolled.     Patient Instructions  Please reduce the Toujeo to 26 units each morning. check your blood sugar twice a day.  vary the time of day when you check, between before  the 3 meals, and at bedtime.  also check if you have symptoms of your blood sugar being too high or too low.  please keep a record of the readings and bring it to your next appointment here (or you can bring the meter itself).  You can write it on any piece of paper.  please call us sooner if your blood sugar goes below 70, or if you have a lot of readings over 200.   Please come back for a follow-up appointment in 3 months.

## 2021-07-13 ENCOUNTER — Other Ambulatory Visit: Payer: Self-pay | Admitting: Family Medicine

## 2021-07-13 DIAGNOSIS — M8589 Other specified disorders of bone density and structure, multiple sites: Secondary | ICD-10-CM

## 2021-07-14 ENCOUNTER — Encounter (INDEPENDENT_AMBULATORY_CARE_PROVIDER_SITE_OTHER): Payer: Self-pay | Admitting: Adult Health

## 2021-07-14 ENCOUNTER — Other Ambulatory Visit (INDEPENDENT_AMBULATORY_CARE_PROVIDER_SITE_OTHER): Payer: Self-pay | Admitting: Adult Health

## 2021-07-14 MED ORDER — TIRZEPATIDE 5 MG/0.5ML ~~LOC~~ SOAJ: 5.0000 mg | SUBCUTANEOUS | 0 refills | Status: DC

## 2021-07-14 NOTE — Telephone Encounter (Signed)
Pt last seen by Katy Danford, FNP.  

## 2021-07-14 NOTE — Telephone Encounter (Signed)
Pt unable to get Mounjaro 7.5 that was prescribed 07-05-21 it is on Backorder. She would like a new script for [facility] to [facility]  LAST APPOINTMENT DATE: 07/05/21 NEXT APPOINTMENT DATE: 07/22/21   [id number] [place] - [address] [place] [address] Phone: [phone] Fax: [phone]  Patient is requesting a refill of the following medications: No prescriptions requested or ordered in this encounter   Date last filled: 07/05/21  Previously prescribed by [facility]  Lab Results      Component                Value               Date                      HGBA1C                   6.1 (A)             07/12/2021                HGBA1C                   5.8 (A)             04/12/2021                HGBA1C                   7.0 (A)             12/10/2020           Lab Results      Component                Value               Date                      LDLCALC                  68                  12/16/2020                CREATININE               1.02 (H)            01/26/2021           Lab Results      Component                Value               Date                      VD25OH                   63.9                04/21/2020            BP Readings from Last 3 Encounters: 07/12/21 : 140/70 07/05/21 : 135/80 06/07/21 : 129/76  08/05/20

## 2021-07-14 NOTE — Progress Notes (Unsigned)
[facility] 

## 2021-07-19 ENCOUNTER — Ambulatory Visit (INDEPENDENT_AMBULATORY_CARE_PROVIDER_SITE_OTHER): Payer: Medicare PPO | Admitting: Adult Health

## 2021-07-22 ENCOUNTER — Encounter (INDEPENDENT_AMBULATORY_CARE_PROVIDER_SITE_OTHER): Payer: Self-pay | Admitting: Adult Health

## 2021-07-22 ENCOUNTER — Other Ambulatory Visit: Payer: Self-pay

## 2021-07-22 ENCOUNTER — Ambulatory Visit (INDEPENDENT_AMBULATORY_CARE_PROVIDER_SITE_OTHER): Payer: Medicare PPO | Admitting: Adult Health

## 2021-07-22 ENCOUNTER — Telehealth (INDEPENDENT_AMBULATORY_CARE_PROVIDER_SITE_OTHER): Payer: Self-pay

## 2021-07-22 VITALS — BP 113/76 | HR 88 | Temp 98.2°F | Ht 63.0 in | Wt 197.0 lb

## 2021-07-22 DIAGNOSIS — E1169 Type 2 diabetes mellitus with other specified complication: Secondary | ICD-10-CM

## 2021-07-22 DIAGNOSIS — Z794 Long term (current) use of insulin: Secondary | ICD-10-CM

## 2021-07-23 ENCOUNTER — Encounter (HOSPITAL_BASED_OUTPATIENT_CLINIC_OR_DEPARTMENT_OTHER): Payer: Self-pay

## 2021-07-27 NOTE — Progress Notes (Signed)
Chief Complaint:   OBESITY Pamela Love is here to discuss her progress with her obesity treatment plan along with follow-up of her obesity related diagnoses. Pamela Love is on the Category 2 Plan and states she is following her eating plan approximately 60% of the time. Pamela Love states she is doing cardio, weights, and dance for 3-4 hours 2-3 times per week.   Today's visit was #: 3 Starting weight: 227 lbs Starting date: 03/17/2020 Today's weight: 197 lbs Today's date: 07/22/2021 Total lbs lost to date: 30 Total lbs lost since last in-office visit: 1  Interim History:  Pamela Love saw Dr. Loanne Drilling, Endocrinologist on 07/12/2021, and her Toujeo was decreased to 26 units down from 32 units.  Her Mounjaro was increased to 7.5 mg on 07/05/2021, but she has been unable to find this strength so a new prescription was sent in for 5 mg.  Pamela Love notes lunch is often difficult to follow.  Subjective:   1. Type 2 diabetes mellitus with other specified complication, with long-term current use of insulin (HCC) Her last A1c was 6.1 on 07/12/2021, at goal. Kermit Balo saw Dr. Loanne Drilling, Endocrinologist on 07/12/2021, and her Toujeo was decreased to 26 units down from 32 units.  Her Mounjaro was increased to 7.5 mg on 07/05/2021, but she has been unable to find this strength so a new prescription was sent in for 5 mg.   Assessment/Plan:   1. Type 2 diabetes mellitus with other specified complication, with long-term current use of insulin (HCC) Pamela Love will continue Toujeo 26 units daily per her Endocrinologist, Dr. Loanne Drilling; and she will continue Mounjaro 5 mg, no refill needed. Good blood sugar control is important to decrease the likelihood of diabetic complications such as nephropathy, neuropathy, limb loss, blindness, coronary artery disease, and death. Intensive lifestyle modification including diet, exercise and weight loss are the first line of treatment for diabetes.   2. Obesity with current BMI of 35.0 Pamela Love is  currently in the action stage of change. As such, her goal is to continue with weight loss efforts. She has agreed to the Category 2 Plan.   Pamela Love is to do 4 oz of protein at lunch, and 6-8 oz of protein at dinner.  Exercise goals: As is.  Behavioral modification strategies: increasing lean protein intake, decreasing simple carbohydrates, meal planning and cooking strategies, keeping healthy foods in the home, and planning for success.  Pamela Love has agreed to follow-up with our clinic in 2 weeks. She was informed of the importance of frequent follow-up visits to maximize her success with intensive lifestyle modifications for her multiple health conditions.   Objective:   Blood pressure 113/76, pulse 88, temperature 98.2 F (36.8 C), height 5\' 3"  (1.6 m), weight 197 lb (89.4 kg), SpO2 98 %. Body mass index is 34.9 kg/m.  General: Cooperative, alert, well developed, in no acute distress. HEENT: Conjunctivae and lids unremarkable. Cardiovascular: Regular rhythm.  Lungs: Normal work of breathing. Neurologic: No focal deficits.   Lab Results  Component Value Date   CREATININE 1.02 (H) 01/26/2021   BUN 28 (H) 01/26/2021   NA 140 01/26/2021   K 4.6 01/26/2021   CL 103 01/26/2021   CO2 23 01/26/2021   Lab Results  Component Value Date   ALT 25 04/21/2020   AST 25 04/21/2020   ALKPHOS 104 04/21/2020   BILITOT 0.5 04/21/2020   Lab Results  Component Value Date   HGBA1C 6.1 (A) 07/12/2021   HGBA1C 5.8 (A) 04/12/2021   HGBA1C 7.0 (A)  12/10/2020   HGBA1C 6.3 (A) 09/02/2020   HGBA1C 6.5 (A) 05/27/2020   No results found for: INSULIN Lab Results  Component Value Date   TSH 1.71 05/30/2018   Lab Results  Component Value Date   CHOL 134 12/16/2020   HDL 41 12/16/2020   LDLCALC 68 12/16/2020   TRIG 142 12/16/2020   CHOLHDL 3.3 12/16/2020   Lab Results  Component Value Date   VD25OH 63.9 04/21/2020   Lab Results  Component Value Date   WBC 7.0 01/26/2021   HGB 13.8  01/26/2021   HCT 41.6 01/26/2021   MCV 92 01/26/2021   PLT 229 01/26/2021   No results found for: IRON, TIBC, FERRITIN  Attestation Statements:   Reviewed by clinician on day of visit: allergies, medications, problem list, medical history, surgical history, family history, social history, and previous encounter notes.  Time spent on visit including pre-visit chart review and post-visit care and charting was 28 minutes.    Trude Mcburney, am acting as transcriptionist for William Hamburger, NP.  I have reviewed the above documentation for accuracy and completeness, and I agree with the above. -  Valta Dillon d. Mattie Nordell, NP-C

## 2021-08-05 ENCOUNTER — Encounter (INDEPENDENT_AMBULATORY_CARE_PROVIDER_SITE_OTHER): Payer: Self-pay | Admitting: Adult Health

## 2021-08-05 ENCOUNTER — Ambulatory Visit (INDEPENDENT_AMBULATORY_CARE_PROVIDER_SITE_OTHER): Payer: Medicare PPO | Admitting: Adult Health

## 2021-08-05 ENCOUNTER — Other Ambulatory Visit: Payer: Self-pay

## 2021-08-05 VITALS — BP 130/77 | HR 77 | Temp 98.0°F | Ht 63.0 in | Wt 201.0 lb

## 2021-08-05 DIAGNOSIS — Z794 Long term (current) use of insulin: Secondary | ICD-10-CM

## 2021-08-05 DIAGNOSIS — E1169 Type 2 diabetes mellitus with other specified complication: Secondary | ICD-10-CM

## 2021-08-05 DIAGNOSIS — Z6841 Body Mass Index (BMI) 40.0 and over, adult: Secondary | ICD-10-CM

## 2021-08-05 NOTE — Progress Notes (Signed)
Chief Complaint:   OBESITY Pamela Love is here to discuss her progress with her obesity treatment plan along with follow-up of her obesity related diagnoses. Pamela Love is on the Category 2 Plan and states she is following her eating plan approximately 30% of the time. Pamela Love states she is doing cardio for 90 minutes 1 time per week.  Today's visit was #: 44 Starting weight: 227 lbs Starting date: 03/17/2020 Today's weight: 201 lbs Today's date: 08/05/2021 Total lbs lost to date: 26 lbs Total lbs lost since last in-office visit: 0  Interim History:  Pamela Love says that over the holidays, she enjoyed foods "foods off plan and more sweets than usual". She also hosted a large number of guests. Now that the holidays are over, she is ready to re-focus healthy eating habits.  Subjective:   1. Type 2 diabetes mellitus with other specified complication, with long-term current use of insulin (HCC) She is on Mounjaro 5 mg once weekly - managed by [facility] - Toujeo 26 units daily - managed by Dr. Rocco Pauls. On 07/12/2021, A1c 6.1 - at goal.   Not checking BG over the last 2 weeks when company was visiting.   She will complete her 4th dose of Mounjaro 5 mg next week. She denies mass in neck, dysphagia, dyspepsia, persistent hoarseness or GI upset.  Assessment/Plan:   1. Type 2 diabetes mellitus with other specified complication, with long-term current use of insulin (HCC) After 4th dose of 5 mg Mounjaro, then increase to 7.5 mg Mounjaro on 08/16/2020 - has prescription already. Monitor BG closely - adjust Toujeo per Dr. Rocco Pauls instructions.  2. Obesity with current BMI of 35.7  Pamela Love is currently in the action stage of change. As such, her goal is to continue with weight loss efforts. She has agreed to the Category 2 Plan with 4 ounces of protein at lunch and 6-8 ounces at dinner.   Exercise goals: No exercise has been prescribed at this time.  Behavioral modification strategies: increasing lean  protein intake, decreasing simple carbohydrates, meal planning and cooking strategies, keeping healthy foods in the home, and planning for success.  Pamela Love has agreed to follow-up with our clinic in 3-4 weeks. She was informed of the importance of frequent follow-up visits to maximize her success with intensive lifestyle modifications for her multiple health conditions.   Objective:   Blood pressure 130/77, pulse 77, temperature 98 F (36.7 C), height 5\' 3"  (1.6 m), weight 201 lb (91.2 kg), SpO2 98 %. Body mass index is 35.61 kg/m.  General: Cooperative, alert, well developed, in no acute distress. HEENT: Conjunctivae and lids unremarkable. Cardiovascular: Regular rhythm.  Lungs: Normal work of breathing. Neurologic: No focal deficits.   Lab Results  Component Value Date   CREATININE 1.02 (H) 01/26/2021   BUN 28 (H) 01/26/2021   NA 140 01/26/2021   K 4.6 01/26/2021   CL 103 01/26/2021   CO2 23 01/26/2021   Lab Results  Component Value Date   ALT 25 04/21/2020   AST 25 04/21/2020   ALKPHOS 104 04/21/2020   BILITOT 0.5 04/21/2020   Lab Results  Component Value Date   HGBA1C 6.1 (A) 07/12/2021   HGBA1C 5.8 (A) 04/12/2021   HGBA1C 7.0 (A) 12/10/2020   HGBA1C 6.3 (A) 09/02/2020   HGBA1C 6.5 (A) 05/27/2020   Lab Results  Component Value Date   TSH 1.71 05/30/2018   Lab Results  Component Value Date   CHOL 134 12/16/2020   HDL 41 12/16/2020  LDLCALC 68 12/16/2020   TRIG 142 12/16/2020   CHOLHDL 3.3 12/16/2020   Lab Results  Component Value Date   VD25OH 63.9 04/21/2020   Lab Results  Component Value Date   WBC 7.0 01/26/2021   HGB 13.8 01/26/2021   HCT 41.6 01/26/2021   MCV 92 01/26/2021   PLT 229 01/26/2021   Attestation Statements:   Reviewed by clinician on day of visit: allergies, medications, problem list, medical history, surgical history, family history, social history, and previous encounter notes.  Time spent on visit including pre-visit chart  review and post-visit care and charting was 28 minutes.   I, Water quality scientist, CMA, am acting as Location manager for [name], NP.  I have reviewed the above documentation for accuracy and completeness, and I agree with the above. -  Anjanae Woehrle d. Ikea Demicco, NP-C

## 2021-08-10 ENCOUNTER — Telehealth (HOSPITAL_BASED_OUTPATIENT_CLINIC_OR_DEPARTMENT_OTHER): Payer: Self-pay | Admitting: Cardiology

## 2021-08-10 NOTE — Telephone Encounter (Signed)
Pamela Love 71 year old female is requesting preoperative cardiac evaluation for colonoscopy.  She was last seen in the clinic on 03/10/2021.  During that time she denied chest pain.  Her angiography was reviewed.  Her aspirin, rosuvastatin, carvedilol, and Ranexa were continued.  Her blood pressure was well controlled.  Follow-up was planned for 6 months.  Her PMH includes arthritis, coronary artery disease, CKD stage III, diabetes mellitus type 2, hyperlipidemia, HTN, obesity, and OSA on CPAP.   May her Plavix be held prior to her procedure?  Thank you for your help.  Please direct your response to [facility] preop pool.  Jossie Ng. Hamed Debella NP-C    08/10/2021, 10:50 AM [address] Office [phone] Fax [phone]

## 2021-08-10 NOTE — Telephone Encounter (Signed)
° °  Pre-operative Risk Assessment    Patient Name: Pamela Love  DOB: 1951/01/07 MRN: [id number]      Request for Surgical Clearance    Procedure:   Colonoscopy  Date of Surgery:  Clearance 09/01/21                                Surgeon:  Dr Juline Patch Group or Practice Name:  [facility] Phone number:  [phone] Fax number:  [phone]   Type of Clearance Requested:   - Medical  - Pharmacy:  Hold Clopidogrel (Plavix) --Length of time not specified , asking for stop and restart dates   Type of Anesthesia:   Propofol   Additional requests/questions:  none  Lorella Nimrod   08/10/2021, 10:31 AM

## 2021-08-11 NOTE — Telephone Encounter (Signed)
Dr. Cristal Deer  Please review and advise on holding Plavix.   Thank you for your help.  Thomasene Ripple. Kristol Almanzar NP-C    08/11/2021, 4:48 PM [facility] [address] Office [phone] Fax [phone]

## 2021-08-20 NOTE — Telephone Encounter (Signed)
° °  Primary Cardiologist: Jodelle Red, MD  Chart reviewed as part of pre-operative protocol coverage. Given past medical history and time since last visit, based on ACC/AHA guidelines, Trent Theisen would be at acceptable risk for the planned procedure without further cardiovascular testing.   Her Plavix may be held for 5 days prior to her procedure.  Please resume as soon as hemostasis is achieved.  Patient was advised that if she develops new symptoms prior to surgery to contact our office to arrange a follow-up appointment.  She verbalized understanding.  I will route this recommendation to the requesting party via Epic fax function and remove from pre-op pool.  Please call with questions.  Thomasene Ripple. Jamis Kryder NP-C    08/20/2021, 3:38 PM [facility] [address] Office [phone] Fax [phone]

## 2021-08-20 NOTE — Telephone Encounter (Signed)
She did not have an intervention during her cath 01/29/21. Ok to hold plavix for colonoscopy, restart as soon as able per gastroenterology.

## 2021-08-20 NOTE — Telephone Encounter (Signed)
Please review and provide recommendations on holding Plavix.  Thank you for your help.  Thomasene Ripple. Midas Daughety NP-C    08/20/2021, 2:43 PM [facility] [address] Office [phone] Fax [phone]

## 2021-08-23 ENCOUNTER — Other Ambulatory Visit: Payer: Self-pay

## 2021-08-23 ENCOUNTER — Encounter (INDEPENDENT_AMBULATORY_CARE_PROVIDER_SITE_OTHER): Payer: Self-pay | Admitting: Adult Health

## 2021-08-23 ENCOUNTER — Ambulatory Visit (INDEPENDENT_AMBULATORY_CARE_PROVIDER_SITE_OTHER): Payer: Medicare PPO | Admitting: Adult Health

## 2021-08-23 VITALS — BP 116/73 | HR 75 | Temp 98.0°F | Ht 63.0 in | Wt 201.0 lb

## 2021-08-23 DIAGNOSIS — Z6841 Body Mass Index (BMI) 40.0 and over, adult: Secondary | ICD-10-CM

## 2021-08-23 DIAGNOSIS — Z6835 Body mass index (BMI) 35.0-35.9, adult: Secondary | ICD-10-CM

## 2021-08-23 DIAGNOSIS — Z794 Long term (current) use of insulin: Secondary | ICD-10-CM | POA: Diagnosis not present

## 2021-08-23 DIAGNOSIS — E1169 Type 2 diabetes mellitus with other specified complication: Secondary | ICD-10-CM | POA: Diagnosis not present

## 2021-08-23 DIAGNOSIS — E669 Obesity, unspecified: Secondary | ICD-10-CM | POA: Diagnosis not present

## 2021-08-23 NOTE — Progress Notes (Signed)
Chief Complaint:   OBESITY Pamela Love is here to discuss her progress with her obesity treatment plan along with follow-up of her obesity related diagnoses. Pamela Love is on the Category 2 Plan with 4 oz of protein at lunch and 6-8 oz of protein at dinner and states she is following her eating plan approximately 60% of the time. [name] states she is walking/aerobics for 15-60 minutes 3 times per week.  Today's visit was #: 59 Starting weight: 227 lbs Starting date: 03/17/2020 Today's weight: 201 lbs Today's date: 08/23/2021 Total lbs lost to date: 26 Total lbs lost since last in-office visit: 0  Interim History:  Pamela Love increased walking and increased attendance at weekly water aerobics classes.  She has increased salad consumption, and she tries to have a salad with dinner nightly.  Of Note: she has noted significant decrease of sugar cravings with Mounjaro 7.5 mg.  Subjective:   1. Type 2 diabetes mellitus with other specified complication, with long-term current use of insulin ([place]) Pamela Love's ambulatory blood glucose, fasting ranges at 126, 181, 136, 121. Her post prandials ranges at 94, 96, 99, 74.  She increased Mounjaro to 7.5 mg on 08/16/2021.  She is also on Toujeo managed by Endocrinology. She denies mass in neck, dysphagia, dyspepsia, persistent hoarseness, or GI upset. She did experience some right hand paresthesias upon waking after increasing the Mounjaro dose from 5mg  to 7.5mg  .  Assessment/Plan:   1. Type 2 diabetes mellitus with other specified complication, with long-term current use of insulin (HCC) Pamela Love will continue her anti-diabetic regimen, and will continue to monitor her blood glucose and monitor paresthesias. Good blood sugar control is important to decrease the likelihood of diabetic complications such as nephropathy, neuropathy, limb loss, blindness, coronary artery disease, and death. Intensive lifestyle modification including diet, exercise and weight loss are the  first line of treatment for diabetes.   2. Obesity with current BMI of 35.6 Pamela Love is currently in the action stage of change. As such, her goal is to continue with weight loss efforts. She has agreed to the Category 2 Plan.   Exercise goals: As is.  Behavioral modification strategies: increasing lean protein intake, decreasing simple carbohydrates, meal planning and cooking strategies, keeping healthy foods in the home, and planning for success.  Pamela Love has agreed to follow-up with our clinic in 2 weeks. She was informed of the importance of frequent follow-up visits to maximize her success with intensive lifestyle modifications for her multiple health conditions.   Objective:   Blood pressure 116/73, pulse 75, temperature 98 F (36.7 C), height 5\' 3"  (1.6 m), weight 201 lb (91.2 kg), SpO2 98 %. Body mass index is 35.61 kg/m.  General: Cooperative, alert, well developed, in no acute distress. HEENT: Conjunctivae and lids unremarkable. Cardiovascular: Regular rhythm.  Lungs: Normal work of breathing. Neurologic: No focal deficits.   Lab Results  Component Value Date   CREATININE 1.02 (H) 01/26/2021   BUN 28 (H) 01/26/2021   NA 140 01/26/2021   K 4.6 01/26/2021   CL 103 01/26/2021   CO2 23 01/26/2021   Lab Results  Component Value Date   ALT 25 04/21/2020   AST 25 04/21/2020   ALKPHOS 104 04/21/2020   BILITOT 0.5 04/21/2020   Lab Results  Component Value Date   HGBA1C 6.1 (A) 07/12/2021   HGBA1C 5.8 (A) 04/12/2021   HGBA1C 7.0 (A) 12/10/2020   HGBA1C 6.3 (A) 09/02/2020   HGBA1C 6.5 (A) 05/27/2020   No results found  for: INSULIN Lab Results  Component Value Date   TSH 1.71 05/30/2018   Lab Results  Component Value Date   CHOL 134 12/16/2020   HDL 41 12/16/2020   LDLCALC 68 12/16/2020   TRIG 142 12/16/2020   CHOLHDL 3.3 12/16/2020   Lab Results  Component Value Date   VD25OH 63.9 04/21/2020   Lab Results  Component Value Date   WBC 7.0 01/26/2021   HGB  13.8 01/26/2021   HCT 41.6 01/26/2021   MCV 92 01/26/2021   PLT 229 01/26/2021   No results found for: IRON, TIBC, FERRITIN  Attestation Statements:   Reviewed by clinician on day of visit: allergies, medications, problem list, medical history, surgical history, family history, social history, and previous encounter notes.  Time spent on visit including pre-visit chart review and post-visit care and charting was 28 minutes.    Pamela Love, am acting as transcriptionist for Pamela Marble, NP.  I have reviewed the above documentation for accuracy and completeness, and I agree with the above. -  Kostantinos Tallman d. Akari Crysler, NP-C

## 2021-08-27 ENCOUNTER — Other Ambulatory Visit: Payer: Self-pay | Admitting: Obstetrics and Gynecology

## 2021-08-27 DIAGNOSIS — Z1231 Encounter for screening mammogram for malignant neoplasm of breast: Secondary | ICD-10-CM

## 2021-09-01 DIAGNOSIS — K648 Other hemorrhoids: Secondary | ICD-10-CM | POA: Diagnosis not present

## 2021-09-01 DIAGNOSIS — Z86010 Personal history of colonic polyps: Secondary | ICD-10-CM | POA: Diagnosis not present

## 2021-09-01 DIAGNOSIS — K5730 Diverticulosis of large intestine without perforation or abscess without bleeding: Secondary | ICD-10-CM | POA: Diagnosis not present

## 2021-09-06 ENCOUNTER — Ambulatory Visit (INDEPENDENT_AMBULATORY_CARE_PROVIDER_SITE_OTHER): Payer: Medicare PPO | Admitting: Adult Health

## 2021-09-06 ENCOUNTER — Encounter (INDEPENDENT_AMBULATORY_CARE_PROVIDER_SITE_OTHER): Payer: Self-pay | Admitting: Adult Health

## 2021-09-06 ENCOUNTER — Other Ambulatory Visit: Payer: Self-pay

## 2021-09-06 VITALS — BP 144/77 | HR 78 | Temp 98.0°F | Ht 63.0 in | Wt 202.0 lb

## 2021-09-06 DIAGNOSIS — Z6835 Body mass index (BMI) 35.0-35.9, adult: Secondary | ICD-10-CM

## 2021-09-06 DIAGNOSIS — E119 Type 2 diabetes mellitus without complications: Secondary | ICD-10-CM | POA: Diagnosis not present

## 2021-09-06 DIAGNOSIS — Z794 Long term (current) use of insulin: Secondary | ICD-10-CM | POA: Diagnosis not present

## 2021-09-06 DIAGNOSIS — E669 Obesity, unspecified: Secondary | ICD-10-CM

## 2021-09-06 DIAGNOSIS — Z961 Presence of intraocular lens: Secondary | ICD-10-CM | POA: Diagnosis not present

## 2021-09-06 DIAGNOSIS — E1169 Type 2 diabetes mellitus with other specified complication: Secondary | ICD-10-CM

## 2021-09-06 DIAGNOSIS — H5213 Myopia, bilateral: Secondary | ICD-10-CM | POA: Diagnosis not present

## 2021-09-06 NOTE — Progress Notes (Signed)
Chief Complaint:   OBESITY Pamela Love is here to discuss her progress with her obesity treatment plan along with follow-up of her obesity related diagnoses. Pamela Love is on the Category 2 Plan and states she is following her eating plan approximately 50% of the time. Pamela Love states she is doing cardio/gym/treadmill 2 times per week.  Today's visit was #: 9 Starting weight: 227 lbs Starting date: 03/17/2020 Today's weight: 202 lbs Today's date: 09/06/2021 Total lbs lost to date: 25 lbs Total lbs lost since last in-office visit: 0  Interim History:  Pamela Love's Mounjaro was increased to 7.5 mg on 08/16/2021. With this strength of Mounjaro- she reports reduced carbohydrate/sugar cravings, however, she has experienced early morning bilateral hand paraesthesias.  Colonoscopy with [facility] - 09/01/2021 - poor prep - repeat in 3 yers. She denies family history of colon cancer.  Mother passed at age 50 years of complications of dementia, pneumonia.  Subjective:   1. Type 2 diabetes mellitus with other specified complication, with long-term current use of insulin (Pamela Love) On 07/12/2021, A1c was 6.1 - at goal. Fasting BG 96, 121, 107, 118, 140, 100, 132. She is on Mounjaro 7.5 mg once weekly. With this strength of Mounjaro- she reports reduced carbohydrate/sugar cravings, however, she has experienced early morning bilateral hand paraesthesias.  Assessment/Plan:   1. Type 2 diabetes mellitus with other specified complication, with long-term current use of insulin (HCC) Complete last 3 pens of Mounjaro 7.5 mg-then discuss Mounjaro dosage. Continue Toujeo 26 units daily - per Endo.  2. Obesity with current BMI of 35.9  Pamela Love is currently in the action stage of change. As such, her goal is to continue with weight loss efforts. She has agreed to the Category 2 Plan.   Exercise goals:  As is.  Behavioral modification strategies: increasing lean protein intake, decreasing simple carbohydrates, meal planning  and cooking strategies, keeping healthy foods in the home, and planning for success.  Pamela Love has agreed to follow-up with our clinic in 2 weeks. She was informed of the importance of frequent follow-up visits to maximize her success with intensive lifestyle modifications for her multiple health conditions.   Objective:   Blood pressure (!) 144/77, pulse 78, temperature 98 F (36.7 C), height 5\' 3"  (1.6 m), weight 202 lb (91.6 kg), SpO2 99 %. Body mass index is 35.78 kg/m.  General: Cooperative, alert, well developed, in no acute distress. HEENT: Conjunctivae and lids unremarkable. Cardiovascular: Regular rhythm.  Lungs: Normal work of breathing. Neurologic: No focal deficits.   Lab Results  Component Value Date   CREATININE 1.02 (H) 01/26/2021   BUN 28 (H) 01/26/2021   NA 140 01/26/2021   K 4.6 01/26/2021   CL 103 01/26/2021   CO2 23 01/26/2021   Lab Results  Component Value Date   ALT 25 04/21/2020   AST 25 04/21/2020   ALKPHOS 104 04/21/2020   BILITOT 0.5 04/21/2020   Lab Results  Component Value Date   HGBA1C 6.1 (A) 07/12/2021   HGBA1C 5.8 (A) 04/12/2021   HGBA1C 7.0 (A) 12/10/2020   HGBA1C 6.3 (A) 09/02/2020   HGBA1C 6.5 (A) 05/27/2020   Lab Results  Component Value Date   TSH 1.71 05/30/2018   Lab Results  Component Value Date   CHOL 134 12/16/2020   HDL 41 12/16/2020   LDLCALC 68 12/16/2020   TRIG 142 12/16/2020   CHOLHDL 3.3 12/16/2020   Lab Results  Component Value Date   VD25OH 63.9 04/21/2020   Lab Results  Component Value Date   WBC 7.0 01/26/2021   HGB 13.8 01/26/2021   HCT 41.6 01/26/2021   MCV 92 01/26/2021   PLT 229 01/26/2021   Attestation Statements:   Reviewed by clinician on day of visit: allergies, medications, problem list, medical history, surgical history, family history, social history, and previous encounter notes.  Time spent on visit including pre-visit chart review and post-visit care and charting was 28 minutes.   I,  Water quality scientist, CMA, am acting as Location manager for [name], NP.  I have reviewed the above documentation for accuracy and completeness, and I agree with the above. -  Kang Ishida d. Keirra Zeimet, NP-C

## 2021-09-09 NOTE — Progress Notes (Signed)
Cardiology Office Note:    Date:  09/10/2021   ID:  Pamela, Love [birth year], MRN [id number]  PCP:  Mayra Neer, MD  Cardiologist:  Buford Dresser, MD  Referring MD: Mayra Neer, MD   CC: Follow-up   History of Present Illness:    Pamela Love is a 71 y.o. female with a hx of arthritis, CAD, CKD stage IIIa, diabetes type 2,  hyperlipidemia, hypertension, obesity, and OSA on CPAP who is seen for follow-up. She was previously a patient of Dr. Harriet Masson.     Cardiovascular risk factors: Prior clinical ASCVD: Angina attacks, CAD. Last cath from 01/2021 reviewed together today. From May 11-June 27th she had 5 anginal attacks. Since her stent placement in 01/2021 she denies any recurring attacks. Comorbid conditions: CKD stage IIIa, diabetes type 2, hyperlipidemia, hypertension Metabolic syndrome/Obesity: BMI 35, working on weight loss with [facility] Exercise level: Exercises at cardiac rehab facility (past year), usually for 1 hour and then becomes fatigued. Still becomes fatigued and short of breath climbing a hill. She is participating in [facility]. Has lost 33 pounds since last September. Current diet: Special occasion glass of wine or frozen margarita.  Today: Overall, she is feeling pretty well. However, she reports having 3 angina attacks from November to January, about once a month. All of the episodes were sudden and unpredictable. Afterwards she becomes very fatigued and drowsy.  The first episode was mild and consisted of jaw pain. This occurred in the evening. Her second episode was more severe, with jaw pain, toothache, headache, nausea, and pain between her breasts. She took 3 nitros, and after 15 minutes she could tell her episode was beginning to resolve. This occurred around midnight. The day before her second episode, she had been walking at the [employer]. Her third episode also consisted of only jaw  pain, which started while washing dishes after dinner. She did not take nitro, but she did rest in bed. This episode did not progress beyond her jaw pain. Of note, she has never had an episode that did not improve with taking 3 nitro, but at times it has taken longer for the episodes to resolve.  When she exercises at the gym, she never experiences angina attacks. She often works out Education officer, community and walking on the treadmill. Recently she started walking on flat sections of road outside, which causes her to be more short of breath than on the treadmill. She also enjoys dancing.  Of note, she reports starting Mounjaro, and subsequently developing numbness and tingling in her bilateral hands.  She denies any palpitations, syncope, orthopnea, or PND.   Past Medical History:  Diagnosis Date   Abnormal nuclear stress test 01/02/2020   Arthritis    Bilateral leg edema 01/02/2020   Bursitis    hips   C. difficile colitis 12/2017   CAD (coronary artery disease) 01/06/2020   CKD (chronic kidney disease), stage III (HCC)    Complication of anesthesia    "spinal did not work with second c section"   Diabetes mellitus ([place]) 11/19/2015   Diabetes mellitus, type 2 ([place])    diet controlled - has Rx for Glymipride but has not started taking yet   Dyslipidemia 10/21/2014   GERD (gastroesophageal reflux disease)    History of nonmelanoma skin cancer    History of skin cancer    Hypercholesterolemia 12/11/2019   Hyperlipidemia    Hypertension    Hypertensive disorder 10/21/2014  Lower extremity edema    Mixed hyperlipidemia 01/02/2020   OAB (overactive bladder)    Obesity (BMI 30-39.9) 05/12/2020   OSA (obstructive sleep apnea) 05/12/2020   Osteoarthritis of right knee 05/15/2015   Osteoarthrosis, unspecified whether generalized or localized, involving lower leg 10/21/2014   Osteoporosis    Over weight    Primary osteoarthritis of left knee 12/18/2014   S/P knee replacement 12/18/2014   S/P total  knee replacement using cement 05/15/2015   Sleep apnea    Tachycardia 12/11/2019    Past Surgical History:  Procedure Laterality Date   ABDOMINAL HYSTERECTOMY  2005   CATARACT EXTRACTION     CESAREAN SECTION     x2   colonscopy      CORONARY STENT INTERVENTION N/A 01/06/2020   Procedure: CORONARY STENT INTERVENTION;  Surgeon: Lorretta Harp, MD;  Location: [facility];  Service: Cardiovascular;  Laterality: N/A;   EYE SURGERY     bilat cataract surgery 2015   INTRAVASCULAR PRESSURE WIRE/FFR STUDY N/A 01/06/2020   Procedure: INTRAVASCULAR PRESSURE WIRE/FFR STUDY;  Surgeon: Lorretta Harp, MD;  Location: [facility];  Service: Cardiovascular;  Laterality: N/A;   LEFT HEART CATH AND CORONARY ANGIOGRAPHY N/A 01/06/2020   Procedure: LEFT HEART CATH AND CORONARY ANGIOGRAPHY;  Surgeon: Lorretta Harp, MD;  Location: [facility];  Service: Cardiovascular;  Laterality: N/A;   LEFT HEART CATH AND CORONARY ANGIOGRAPHY N/A 01/29/2021   Procedure: LEFT HEART CATH AND CORONARY ANGIOGRAPHY;  Surgeon: Jettie Booze, MD;  Location: [facility];  Service: Cardiovascular;  Laterality: N/A;   TOTAL KNEE ARTHROPLASTY Left 12/18/2014   Procedure: TOTAL LEFT KNEE ARTHROPLASTY;  Surgeon: Sydnee Cabal, MD;  Location: [facility];  Service: Orthopedics;  Laterality: Left;   TOTAL KNEE ARTHROPLASTY Right 05/15/2015   Procedure: RIGHT TOTAL KNEE ARTHROPLASTY;  Surgeon: Sydnee Cabal, MD;  Location: [facility];  Service: Orthopedics;  Laterality: Right;   UTERINE FIBROID SURGERY      Current Medications: Current Outpatient Medications on File Prior to Visit  Medication Sig   Accu-Chek Softclix Lancets lancets USE TO CHECK BLOOD SUGAR TWICE DAILY   acetaminophen (TYLENOL) 650 MG CR tablet Take 1,300 mg by mouth at bedtime.   aspirin EC 81 MG tablet Take 1 tablet (81 mg total) by mouth in the morning.   Blood Glucose Monitoring Suppl (ACCU-CHEK GUIDE ME) w/Device KIT 1 each by Does  not apply route 2 (two) times daily. E11.9   carvedilol (COREG) 3.125 MG tablet Take 1 tablet (3.125 mg total) by mouth 2 (two) times daily with a meal.   Cholecalciferol (VITAMIN D) 2000 UNITS CAPS Take 2,000 Units by mouth every morning.    clopidogrel (PLAVIX) 75 MG tablet Take 1 tablet (75 mg total) by mouth daily with breakfast.   Coenzyme Q10 (COQ10) 200 MG CAPS Take 200 mg by mouth in the morning.   DULoxetine (CYMBALTA) 20 MG capsule Take 40 mg by mouth at bedtime.    furosemide (LASIX) 40 MG tablet Take 1 tablet (40 mg total) by mouth 3 (three) times a week. Tuesdays, Thursdays & Saturdays in the morning   glucose blood (ACCU-CHEK GUIDE) test strip Use to check BS 2x a day   insulin glargine, 2 Unit Dial, (TOUJEO MAX SOLOSTAR) 300 UNIT/ML Solostar Pen Inject 26 Units into the skin in the morning. And 4 mm pen needles 2/day   Insulin Pen Needle (BD PEN NEEDLE NANO U/F) 32G X 4 MM MISC  1 each by Does not apply route daily.   Multiple Vitamin (MULTIVITAMIN WITH MINERALS) TABS tablet Take 1 tablet by mouth every morning.    Omega-3 Fatty Acids (RA FISH OIL) 1400 MG CPDR Take 1,400 mg by mouth 2 (two) times daily.   pantoprazole (PROTONIX) 40 MG tablet Take 40 mg by mouth daily.   potassium chloride SA (KLOR-CON M20) 20 MEQ tablet Take 1 tablet (20 mEq total) by mouth 3 (three) times a week. Tuesdays, Thursdays & Saturdays in the morning   ranolazine (RANEXA) 1000 MG SR tablet Take 1 tablet (1,000 mg total) by mouth 2 (two) times daily.   rosuvastatin (CRESTOR) 40 MG tablet Take 1 tablet (40 mg total) by mouth in the morning.   saccharomyces boulardii (FLORASTOR) 250 MG capsule Take 250 mg by mouth in the morning.   tirzepatide [facility]) 5 MG/0.5ML Pen Inject 5 mg into the skin once a week. (Patient taking differently: Inject 7.5 mg into the skin once a week.)   trospium (SANCTURA) 20 MG tablet Take 20 mg by mouth 2 (two) times daily.   vitamin C (ASCORBIC ACID) 250 MG tablet Take 250 mg by  mouth in the morning.   nitroGLYCERIN (NITROSTAT) 0.4 MG SL tablet Place 1 tablet (0.4 mg total) under the tongue every 5 (five) minutes x 3 doses as needed for chest pain.   No current facility-administered medications on file prior to visit.     Allergies:   Amoxicillin-pot clavulanate, Aspirin, Colchicine, Diclofenac, Dulaglutide, Empagliflozin, Ezetimibe, Imdur [isosorbide nitrate], Imipramine hcl, Isosorbide, Metformin, Repaglinide, Sitagliptin-metformin hcl, Sulfamethoxazole-trimethoprim, Imipramine, and Tape   Social History   Tobacco Use   Smoking status: Never   Smokeless tobacco: Never  Vaping Use   Vaping Use: Never used  Substance Use Topics   Alcohol use: Yes    Comment: rare   Drug use: No    Family History: family history includes Cancer in her mother; Congenital heart disease in her son; Diabetes in her maternal grandfather; Heart attack in her father, maternal grandfather, paternal uncle, and sister; Heart disease in her father and mother; Hyperlipidemia in her father and mother; Hypertension in her father and mother; Sleep apnea in her father; Sudden death in her father.  ROS:   Please see the history of present illness. (+) Chest pain (+) Jaw pain (+) Exertional shortness of breath (+) Headache (+) Nausea (+) Fatigue (+) Numbness/tingling in bilateral hands All other systems are reviewed and negative.    EKGs/Labs/Other Studies Reviewed:    The following studies were reviewed today:  LHC 01/29/2021: Prox LAD to Mid LAD lesion is 40-50% stenosed. Angiographic appeareance improved compared to prior cath in 2021 when DFR was negative. Patent RCA stents. The left ventricular systolic function is normal. LV end diastolic pressure is normal. The left ventricular ejection fraction is 55-65% by visual estimate. There is no aortic valve stenosis.   Continue medical therapy.  Echo 01/07/2021:  1. Left ventricular ejection fraction, by estimation, is 60 to 65%.  The  left ventricle has normal function. The left ventricle has no regional  wall motion abnormalities. Left ventricular diastolic parameters are  consistent with Grade I diastolic  dysfunction (impaired relaxation).   2. Right ventricular systolic function is normal. The right ventricular  size is normal. There is normal pulmonary artery systolic pressure.   3. The mitral valve is normal in structure. Mild mitral valve  regurgitation. No evidence of mitral stenosis.   4. The aortic valve is normal in structure.  There is mild calcification  of the aortic valve. There is mild thickening of the aortic valve. Aortic  valve regurgitation is not visualized. No aortic stenosis is present.   5. The inferior vena cava is normal in size with greater than 50%  respiratory variability, suggesting right atrial pressure of 3 mmHg.   Lexiscan Myoview 01/01/2020: The left ventricular ejection fraction is normal (55-65%). Nuclear stress EF: 62%. There was no ST segment deviation noted during stress. Defect 1: There is a small defect of mild severity present in the basal anterior and mid anterior location. Findings consistent with ischemia. This is an intermediate risk study. Moderate area of mild ischemia involving LAD teritory. Normal LVEF.  EKG:  EKG is personally reviewed.   09/10/2021: NSR, PRWP, 81 bpm 03/10/2021: not ordered  Recent Labs: 01/26/2021: BUN 28; Creatinine, Ser 1.02; Hemoglobin 13.8; Magnesium 2.0; Platelets 229; Potassium 4.6; Sodium 140   Recent Lipid Panel    Component Value Date/Time   CHOL 134 12/16/2020 0957   TRIG 142 12/16/2020 0957   HDL 41 12/16/2020 0957   CHOLHDL 3.3 12/16/2020 0957   LDLCALC 68 12/16/2020 0957    Physical Exam:    VS:  BP 128/72    Pulse 81    Ht '5\' 3"'  (1.6 m)    Wt 204 lb (92.5 kg)    SpO2 97%    BMI 36.14 kg/m     Wt Readings from Last 3 Encounters:  09/10/21 204 lb (92.5 kg)  09/06/21 202 lb (91.6 kg)  08/23/21 201 lb (91.2 kg)     GEN: Well nourished, well developed in no acute distress HEENT: Normal, moist mucous membranes NECK: No JVD CARDIAC: regular rhythm, normal S1 and S2, no rubs or gallops. No murmur. VASCULAR: Radial and DP pulses 2+ bilaterally. No carotid bruits RESPIRATORY:  Clear to auscultation without rales, wheezing or rhonchi  ABDOMEN: Soft, non-tender, non-distended MUSCULOSKELETAL:  Ambulates independently SKIN: Warm and dry, no edema NEUROLOGIC:  Alert and oriented x 3. No focal neuro deficits noted. PSYCHIATRIC:  Normal affect    ASSESSMENT:    1. Coronary artery disease involving native coronary artery of native heart with other form of angina pectoris ([place])   2. Essential hypertension   3. Type 2 diabetes mellitus with other specified complication, with long-term current use of insulin (HCC)   4. Obesity, Class II, BMI 35-39.9   5. Mixed hyperlipidemia   6. OSA (obstructive sleep apnea)   7. Cardiac risk counseling   8. Counseling on health promotion and disease prevention     PLAN:    CAD, with prior PCI Mixed hyperlipidemia -she has had three episodes in the last few months that she has labeled angina. I am concerned about these--they are all in the evening, all nonexertional. Two of the three were sharp jaw pain, but the second episode was jaw pain, central chest pain. We discussed stable vs unstable angina--if cardiac, these were all unstable angina. However, there are a few points that are less typical for cardiac etiology. They were all in the evening, either after dinner or laying in bed at night. She has a history of GERD (on pantoprazole). The two with sharp jaw pain are atypical -the other point is that she has only very minimal untreated CAD based on her prior cath 01/2021. Her LAD lesion was nonobstructive and looked improved compared to prior, and her RCA stents were patent. There was not other significant disease. This would suggest that typical angina would  be less common,  unless it is due to spasm or microvascular disease -we discussed several options. We discussed very low dose amlodipine; will wait on this for now. She will monitor for symptoms. She will briefly try something for reflux when symptoms begin to see if this helps. I also recommended that if she has a severe episode, she should come to the ER. She lives in [place], but if this is obstruction she may still have positive troponins afterward, which would help [place] determine if this is a cardiac etiology -Aspirin 81 mg -statin: continue rosuvastatin 40 mg daily -LDL goal <70, last LDL 68 -antianginals: beta blocker (carvedilol), ranolazine. Not on isosorbide as it made her hypotensive -Given type II diabetes and CAD, recommend either SGLT2 inhibitor or GLP1-RA, she is on mounjaro but having issues with possible side effects -has prescription for sublingual nitroglycerin, counseled on lifespan of this medication -counseled on diet recommendations and AHA exercise guidelines, below -risk factor modification, as below -counseled on red flag warning signs that need immediate medical attention   Hypertension:  -at goal of <130/80, continue current meds  OSA: -continue CPAP. Asking about alternatives, deferred to Dr. Claiborne Billings  Obesity, BMI 36 Cardiac risk counseling and prevention recommendations: -recommend heart healthy/Mediterranean diet, with whole grains, fruits, vegetable, fish, lean meats, nuts, and olive oil. Limit salt. -recommend moderate walking, 3-5 times/week for 30-50 minutes each session. Aim for at least 150 minutes.week. Goal should be pace of 3 miles/hours, or walking 1.5 miles in 30 minutes -recommend avoidance of tobacco products. Avoid excess alcohol. -ASCVD risk score: The ASCVD Risk score (Arnett DK, et al., 2019) failed to calculate for the following reasons:   The patient has a prior MI or stroke diagnosis    Plan for follow up: 3 months or sooner as needed.  Buford Dresser, MD, PhD, [facility]    Medication Adjustments/Labs and Tests Ordered: Current medicines are reviewed at length with the patient today.  Concerns regarding medicines are outlined above.   Orders Placed This Encounter  Procedures   EKG 12-Lead   No orders of the defined types were placed in this encounter.  Patient Instructions  Medication Instructions:  Your Physician recommend you continue on your current medication as directed.    *If you need a refill on your cardiac medications before your next appointment, please call your pharmacy*   Lab Work: None ordered today   Testing/Procedures: None ordered today   Follow-Up: At [facility], you and your health needs are our priority.  As part of our continuing mission to provide you with exceptional heart care, we have created designated Provider Care Teams.  These Care Teams include your primary Cardiologist (physician) and Advanced Practice Providers (APPs -  Physician Assistants and Nurse Practitioners) who all work together to provide you with the care you need, when you need it.  We recommend signing up for the patient portal called "MyChart".  Sign up information is provided on this After Visit Summary.  MyChart is used to connect with patients for Virtual Visits (Telemedicine).  Patients are able to view lab/test results, encounter notes, upcoming appointments, etc.  Non-urgent messages can be sent to your provider as well.   To learn more about what you can do with MyChart, go to [email or web address].    Your next appointment:   3 month(s)  The format for your next appointment:   In Person  Provider:   Buford Dresser, MD  I,Mathew Stumpf,acting as a Education administrator for [employer], MD.,have documented all relevant documentation on the behalf of Buford Dresser, MD,as directed by  Buford Dresser, MD while in the presence of Buford Dresser,  MD.  I, Buford Dresser, MD, have reviewed all documentation for this visit. The documentation on 09/10/21 for the exam, diagnosis, procedures, and orders are all accurate and complete.   Signed, Buford Dresser, MD PhD 09/10/2021 1:04 PM    [place]

## 2021-09-10 ENCOUNTER — Encounter (HOSPITAL_BASED_OUTPATIENT_CLINIC_OR_DEPARTMENT_OTHER): Payer: Self-pay | Admitting: Cardiology

## 2021-09-10 ENCOUNTER — Other Ambulatory Visit: Payer: Self-pay

## 2021-09-10 ENCOUNTER — Ambulatory Visit (HOSPITAL_BASED_OUTPATIENT_CLINIC_OR_DEPARTMENT_OTHER): Payer: Medicare PPO | Admitting: Cardiology

## 2021-09-10 VITALS — BP 128/72 | HR 81 | Ht 63.0 in | Wt 204.0 lb

## 2021-09-10 DIAGNOSIS — Z7189 Other specified counseling: Secondary | ICD-10-CM | POA: Diagnosis not present

## 2021-09-10 DIAGNOSIS — E782 Mixed hyperlipidemia: Secondary | ICD-10-CM | POA: Diagnosis not present

## 2021-09-10 DIAGNOSIS — I10 Essential (primary) hypertension: Secondary | ICD-10-CM | POA: Diagnosis not present

## 2021-09-10 DIAGNOSIS — G4733 Obstructive sleep apnea (adult) (pediatric): Secondary | ICD-10-CM | POA: Diagnosis not present

## 2021-09-10 DIAGNOSIS — E669 Obesity, unspecified: Secondary | ICD-10-CM | POA: Diagnosis not present

## 2021-09-10 DIAGNOSIS — Z794 Long term (current) use of insulin: Secondary | ICD-10-CM | POA: Diagnosis not present

## 2021-09-10 DIAGNOSIS — E1169 Type 2 diabetes mellitus with other specified complication: Secondary | ICD-10-CM | POA: Diagnosis not present

## 2021-09-10 DIAGNOSIS — I25118 Atherosclerotic heart disease of native coronary artery with other forms of angina pectoris: Secondary | ICD-10-CM

## 2021-09-10 NOTE — Patient Instructions (Signed)

## 2021-09-21 ENCOUNTER — Other Ambulatory Visit: Payer: Self-pay

## 2021-09-21 ENCOUNTER — Encounter (INDEPENDENT_AMBULATORY_CARE_PROVIDER_SITE_OTHER): Payer: Self-pay | Admitting: Adult Health

## 2021-09-21 ENCOUNTER — Ambulatory Visit (INDEPENDENT_AMBULATORY_CARE_PROVIDER_SITE_OTHER): Payer: Medicare PPO | Admitting: Adult Health

## 2021-09-21 VITALS — BP 120/71 | HR 79 | Temp 97.9°F | Ht 63.0 in | Wt 200.0 lb

## 2021-09-21 DIAGNOSIS — E669 Obesity, unspecified: Secondary | ICD-10-CM | POA: Diagnosis not present

## 2021-09-21 DIAGNOSIS — E1169 Type 2 diabetes mellitus with other specified complication: Secondary | ICD-10-CM

## 2021-09-21 DIAGNOSIS — Z6835 Body mass index (BMI) 35.0-35.9, adult: Secondary | ICD-10-CM | POA: Diagnosis not present

## 2021-09-21 DIAGNOSIS — Z794 Long term (current) use of insulin: Secondary | ICD-10-CM

## 2021-09-21 MED ORDER — SEMAGLUTIDE (2 MG/DOSE) 8 MG/3ML ~~LOC~~ SOPN: 2.0000 mg | PEN_INJECTOR | SUBCUTANEOUS | 0 refills | Status: DC

## 2021-09-21 NOTE — Progress Notes (Signed)
Chief Complaint:   OBESITY Pamela Love is here to discuss her progress with her obesity treatment plan along with follow-up of her obesity related diagnoses. Pamela Love is on the Category 2 Plan and states she is following her eating plan approximately 65% of the time. Pamela Love states she is doing cardio/dance 60 minutes 2 times per week.  Today's visit was #: 33 Starting weight: 227 lbs Starting date: 03/17/2020 Today's weight: 200 lbs Today's date: 09/21/2021 Total lbs lost to date: 227 lbs Total lbs lost since last in-office visit: 2 lbs  Interim History:  Pamela Love provided the following food recall: Breakfast - mixed fruit (blueberries, strawberries, melon), yogurt. Lunch - Roast beef, cheese, cottage cheese or protein shake. Supper - Meat, vegetable, starch. Dessert - [place] bar.  Of Note: 09/10/21 Cards/OV: Aspirin 81 mg -statin: continue rosuvastatin 40 mg daily -LDL goal <70, last LDL 68 -antianginals: beta blocker (carvedilol), ranolazine. Not on isosorbide as it made her hypotensive -Given type II diabetes and CAD, recommend either SGLT2 inhibitor or GLP1-RA, she is on mounjaro but having issues with possible side effects- upper extremity paresthesias  -has prescription for sublingual nitroglycerin, counseled on lifespan of this medication -counseled on diet recommendations and AHA exercise guidelines, below -risk factor modification, as below -counseled on red flag warning signs that need immediate medical attention   Subjective:   1. Type 2 diabetes mellitus with other specified complication, with long-term current use of insulin (HCC)  04/12/2021, A1c 5.8 - on Toujeo 42 units daily, Victoza 1.2 mg 07/12/2021, A1c 6.1 - at goal - on Toujeo 32 units daily, Mounjaro 5 mg once weekly.  Since 09/06/2020- ambulatory fasting BG 116, 155, 174, 98, 109. She is currently on Toujeo 26 units daily, Mounjaro 7.5 mg once weekly - 6 doses at this strength. She continues to experience and numbness  and tingling in her bilateral hands, R>L. She denies mass in neck, dysphagia, dyspepsia, persistent hoarseness, abd  pain, or GI upset.  She is followed by Pamela Love Q3M- next OV 10/14/21.  Assessment/Plan:   1. Type 2 diabetes mellitus with other specified complication, with long-term current use of insulin (HCC) Stop Mounjaro. Start Ozempic 2 mg once weekly. Continue to closely monitor BG - contact clinic with any BG <70 or consistent readings <200.  - Start Semaglutide, 2 MG/DOSE, 8 MG/3ML SOPN; Inject 2 mg as directed once a week.  Dispense: 3 mL; Refill: 0 -If unable to obtain Semaglutide, then contact clinic (Mounjaro 5mg )  2. Obesity with current BMI of 35.5  Pamela Love is currently in the action stage of change. As such, her goal is to continue with weight loss efforts. She has agreed to the Category 2 Plan.   Exercise goals:  As is.  Behavioral modification strategies: increasing lean protein intake, decreasing simple carbohydrates, meal planning and cooking strategies, keeping healthy foods in the home, and planning for success.  Pamela Love has agreed to follow-up with our clinic in 3 weeks. She was informed of the importance of frequent follow-up visits to maximize her success with intensive lifestyle modifications for her multiple health conditions.   Objective:   Blood pressure 120/71, pulse 79, temperature 97.9 F (36.6 C), height 5\' 3"  (1.6 m), weight 200 lb (90.7 kg), SpO2 99 %. Body mass index is 35.43 kg/m.  General: Cooperative, alert, well developed, in no acute distress. HEENT: Conjunctivae and lids unremarkable. Cardiovascular: Regular rhythm.  Lungs: Normal work of breathing. Neurologic: No focal deficits.   Lab Results  Component  Value Date   CREATININE 1.02 (H) 01/26/2021   BUN 28 (H) 01/26/2021   NA 140 01/26/2021   K 4.6 01/26/2021   CL 103 01/26/2021   CO2 23 01/26/2021   Lab Results  Component Value Date   ALT 25 04/21/2020   AST 25  04/21/2020   ALKPHOS 104 04/21/2020   BILITOT 0.5 04/21/2020   Lab Results  Component Value Date   HGBA1C 6.1 (A) 07/12/2021   HGBA1C 5.8 (A) 04/12/2021   HGBA1C 7.0 (A) 12/10/2020   HGBA1C 6.3 (A) 09/02/2020   HGBA1C 6.5 (A) 05/27/2020   Lab Results  Component Value Date   TSH 1.71 05/30/2018   Lab Results  Component Value Date   CHOL 134 12/16/2020   HDL 41 12/16/2020   LDLCALC 68 12/16/2020   TRIG 142 12/16/2020   CHOLHDL 3.3 12/16/2020   Lab Results  Component Value Date   VD25OH 63.9 04/21/2020   Lab Results  Component Value Date   WBC 7.0 01/26/2021   HGB 13.8 01/26/2021   HCT 41.6 01/26/2021   MCV 92 01/26/2021   PLT 229 01/26/2021   Obesity Behavioral Intervention:   Approximately 15 minutes were spent on the discussion below.  ASK: We discussed the diagnosis of obesity with Millian today and Sharren agreed to give [place] permission to discuss obesity behavioral modification therapy today.  ASSESS: Cathe has the diagnosis of obesity and her BMI today is 35.5. Ceairra is in the action stage of change.   ADVISE: Prestina was educated on the multiple health risks of obesity as well as the benefit of weight loss to improve her health. She was advised of the need for long term treatment and the importance of lifestyle modifications to improve her current health and to decrease her risk of future health problems.  AGREE: Multiple dietary modification options and treatment options were discussed and Elmer agreed to follow the recommendations documented in the above note.  ARRANGE: Caetlyn was educated on the importance of frequent visits to treat obesity as outlined per CMS and USPSTF guidelines and agreed to schedule her next follow up appointment today.  Attestation Statements:   Reviewed by clinician on day of visit: allergies, medications, problem list, medical history, surgical history, family history, social history, and previous encounter notes.  I, Water quality scientist,  CMA, am acting as Location manager for [name], NP.  I have reviewed the above documentation for accuracy and completeness, and I agree with the above. -  Xochil Shanker d. [place], NP-C

## 2021-10-05 ENCOUNTER — Ambulatory Visit (INDEPENDENT_AMBULATORY_CARE_PROVIDER_SITE_OTHER): Payer: Medicare PPO | Admitting: Adult Health

## 2021-10-11 ENCOUNTER — Other Ambulatory Visit: Payer: Self-pay

## 2021-10-11 ENCOUNTER — Ambulatory Visit (INDEPENDENT_AMBULATORY_CARE_PROVIDER_SITE_OTHER): Payer: Medicare PPO | Admitting: Family Medicine

## 2021-10-11 ENCOUNTER — Encounter (INDEPENDENT_AMBULATORY_CARE_PROVIDER_SITE_OTHER): Payer: Self-pay | Admitting: Family Medicine

## 2021-10-11 VITALS — BP 111/72 | HR 76 | Temp 97.9°F | Ht 63.0 in | Wt 200.0 lb

## 2021-10-11 DIAGNOSIS — E669 Obesity, unspecified: Secondary | ICD-10-CM

## 2021-10-11 DIAGNOSIS — Z794 Long term (current) use of insulin: Secondary | ICD-10-CM

## 2021-10-11 DIAGNOSIS — Z6835 Body mass index (BMI) 35.0-35.9, adult: Secondary | ICD-10-CM

## 2021-10-11 DIAGNOSIS — G5691 Unspecified mononeuropathy of right upper limb: Secondary | ICD-10-CM

## 2021-10-11 DIAGNOSIS — E1169 Type 2 diabetes mellitus with other specified complication: Secondary | ICD-10-CM | POA: Diagnosis not present

## 2021-10-11 DIAGNOSIS — Z7985 Long-term (current) use of injectable non-insulin antidiabetic drugs: Secondary | ICD-10-CM

## 2021-10-11 MED ORDER — SEMAGLUTIDE (2 MG/DOSE) 8 MG/3ML ~~LOC~~ SOPN: 2.0000 mg | PEN_INJECTOR | SUBCUTANEOUS | 0 refills | Status: DC

## 2021-10-12 NOTE — Progress Notes (Signed)
? ? ? ?Chief Complaint:  ? ?OBESITY ?Pamela Love is here to discuss her progress with her obesity treatment plan along with follow-up of her obesity related diagnoses. Cassara is on the Category 2 Plan and states she is following her eating plan approximately 55% of the time. Saida states she is doing cardio and strength training, water aerobics, and dance for 120-180 minutes 1 time per week. ? ?Today's visit was #: 70 ?Starting weight: 227 lbs ?Starting date: 03/17/2020 ?Today's weight: 200 lbs ?Today's date: 10/11/2021 ?Total lbs lost to date: 27 lbs ?Total lbs lost since last in-office visit: 0 ? ?Interim History: [name] normally sees [name], NP, or Dr. Juleen China.  This is her first office visit with me.  She had a recent birthday and has been eating off plan for about 2 weeks or so.  She has all foods and has to get back on plan and will do so in the near future. ? ?Subjective:  ? ?1. Type 2 diabetes mellitus with other specified complication, with long-term current use of insulin ([place]) ?At her last office visit with [name], NP, Sanaiyah had side effects to [facility] and was changed to Ozempic.  She has been on Ozempic for 3 weeks now.  Also taking Toujeo.  Denies GI side effects now.  FBS 116-155.  A1c 6.1 around 3 months ago.  She has an upcoming office visit with Dr. Rocco Pauls. ? ?2. Neuropathy of right hand ?Since going on Mounjaro 7.5 mg weekly, started with right hand with carpal tunnel symptoms.  Never had in the past.  Only occurs after repetitive hand movements and when she wakes in the morning sometimes.  No new exercises or new activities. ? ?Assessment/Plan:  ?No orders of the defined types were placed in this encounter. ? ? ?Medications Discontinued During This Encounter  ?Medication Reason  ? Semaglutide, 2 MG/DOSE, 8 MG/3ML SOPN Reorder  ?  ? ?Meds ordered this encounter  ?Medications  ? Semaglutide, 2 MG/DOSE, 8 MG/3ML SOPN  ?  Sig: Inject 2 mg as directed once a week.  ?  Dispense:  3 mL  ?   Refill:  0  ?  ? ?1. Type 2 diabetes mellitus with other specified complication, with long-term current use of insulin ([place]) ?Refill Ozempic 2 mg once weekly. ? ?- Refill Semaglutide, 2 MG/DOSE, 8 MG/3ML SOPN; Inject 2 mg as directed once a week.  Dispense: 3 mL; Refill: 0 ? ?2. Neuropathy of right hand ?Obtain carpal tunnel brace - showed patient which one.  Continue to avoid repetitive movements.  Continue to avoid Mounjaro and will monitor symptoms. ? ?3. Obesity, current BMI 35.5 ? ?[name] is currently in the action stage of change. As such, her goal is to continue with weight loss efforts. She has agreed to the Category 2 Plan.  ? ?Continue with water aerobics but pay attention to exercises and if any make carpal tunnel worse. ? ?Exercise goals:  As is. ? ?Behavioral modification strategies: avoiding temptations and planning for success. ? ?Octa has agreed to follow-up with our clinic in 3 weeks. She was informed of the importance of frequent follow-up visits to maximize her success with intensive lifestyle modifications for her multiple health conditions.  ? ?Objective:  ? ?Blood pressure 111/72, pulse 76, temperature 97.9 ?F (36.6 ?C), height 5\' 3"  (1.6 m), weight 200 lb (90.7 kg), SpO2 99 %. ?Body mass index is 35.43 kg/m?. ? ?General: Cooperative, alert, well developed, in no acute distress. ?HEENT: Conjunctivae and lids unremarkable. ?  Cardiovascular: Regular rhythm.  ?Lungs: Normal work of breathing. ?Neurologic: Decreased sensation in median nerve distribution from wrist distally - right hand.  ? ?Lab Results  ?Component Value Date  ? CREATININE 1.02 (H) 01/26/2021  ? BUN 28 (H) 01/26/2021  ? NA 140 01/26/2021  ? K 4.6 01/26/2021  ? CL 103 01/26/2021  ? CO2 23 01/26/2021  ? ?Lab Results  ?Component Value Date  ? ALT 25 04/21/2020  ? AST 25 04/21/2020  ? ALKPHOS 104 04/21/2020  ? BILITOT 0.5 04/21/2020  ? ?Lab Results  ?Component Value Date  ? HGBA1C 6.1 (A) 07/12/2021  ? HGBA1C 5.8 (A) 04/12/2021  ?  HGBA1C 7.0 (A) 12/10/2020  ? HGBA1C 6.3 (A) 09/02/2020  ? HGBA1C 6.5 (A) 05/27/2020  ? ?Lab Results  ?Component Value Date  ? TSH 1.71 05/30/2018  ? ?Lab Results  ?Component Value Date  ? CHOL 134 12/16/2020  ? HDL 41 12/16/2020  ? [place] 68 12/16/2020  ? TRIG 142 12/16/2020  ? CHOLHDL 3.3 12/16/2020  ? ?Lab Results  ?Component Value Date  ? VD25OH 63.9 04/21/2020  ? ?Lab Results  ?Component Value Date  ? WBC 7.0 01/26/2021  ? HGB 13.8 01/26/2021  ? HCT 41.6 01/26/2021  ? MCV 92 01/26/2021  ? PLT 229 01/26/2021  ? ?Obesity Behavioral Intervention:  ? ?Approximately 15 minutes were spent on the discussion below. ? ?ASK: ?We discussed the diagnosis of obesity with Sherise today and Zipora agreed to give [place] permission to discuss obesity behavioral modification therapy today. ? ?ASSESS: ?Maninder has the diagnosis of obesity and her BMI today is 35.5. Livy is in the action stage of change.  ? ?ADVISE: ?Damonique was educated on the multiple health risks of obesity as well as the benefit of weight loss to improve her health. She was advised of the need for long term treatment and the importance of lifestyle modifications to improve her current health and to decrease her risk of future health problems. ? ?AGREE: ?Multiple dietary modification options and treatment options were discussed and Stormy agreed to follow the recommendations documented in the above note. ? ?ARRANGE: ?Rafeef was educated on the importance of frequent visits to treat obesity as outlined per CMS and USPSTF guidelines and agreed to schedule her next follow up appointment today. ? ?Attestation Statements:  ? ?Reviewed by clinician on day of visit: allergies, medications, problem list, medical history, surgical history, family history, social history, and previous encounter notes. ? ?I, Water quality scientist, CMA, am acting as transcriptionist for [employer], DO. ? ?I have reviewed the above documentation for accuracy and completeness, and I agree with the above. [name], D.O. ? ?The [place] was signed into law in 2016 which includes the topic of electronic health records.  This provides immediate access to information in MyChart.  This includes consultation notes, operative notes, office notes, lab results and pathology reports.  If you have any questions about what you read please let us know at your next visit so we can discuss your concerns and take corrective action if need be.  We are right here with you. ? ?

## 2021-10-14 ENCOUNTER — Encounter: Payer: Self-pay | Admitting: Endocrinology

## 2021-10-14 ENCOUNTER — Other Ambulatory Visit: Payer: Self-pay

## 2021-10-14 ENCOUNTER — Ambulatory Visit: Payer: Medicare PPO | Admitting: Endocrinology

## 2021-10-14 VITALS — BP 138/90 | HR 86 | Ht 63.0 in | Wt 202.8 lb

## 2021-10-14 DIAGNOSIS — N1831 Chronic kidney disease, stage 3a: Secondary | ICD-10-CM

## 2021-10-14 DIAGNOSIS — E1122 Type 2 diabetes mellitus with diabetic chronic kidney disease: Secondary | ICD-10-CM

## 2021-10-14 LAB — POCT GLYCOSYLATED HEMOGLOBIN (HGB A1C): Hemoglobin A1C: 5.8 % — AB (ref 4.0–5.6)

## 2021-10-14 MED ORDER — TOUJEO MAX SOLOSTAR 300 UNIT/ML ~~LOC~~ SOPN: 18.0000 [IU] | PEN_INJECTOR | Freq: Every morning | SUBCUTANEOUS | 3 refills | Status: DC

## 2021-10-14 NOTE — Patient Instructions (Addendum)
Please reduce the Toujeo to 18 units each morning.   ?check your blood sugar twice a day.  vary the time of day when you check, between before the 3 meals, and at bedtime.  also check if you have symptoms of your blood sugar being too high or too low.  please keep a record of the readings and bring it to your next appointment here (or you can bring the meter itself).  You can write it on any piece of paper.  please call us sooner if your blood sugar goes below 70, or if you have a lot of readings over 200.   ?Please come back for a follow-up appointment in 2 months.   ? ?

## 2021-10-14 NOTE — Progress Notes (Signed)
? ?Subjective:  ? ? Patient ID: Pamela Love, female    DOB: 05-22-1951, 71 y.o.   MRN: [id number] ? ?HPI ?Pt returns for f/u of diabetes mellitus:  ?DM type: Insulin-requiring type 2 ?Dx'ed: 2012 ?Complications: CRI and CAD.   ?Therapy: insulin since 2019, and Ozempic ?GDM: never.  ?DKA: never.  ?Severe hypoglycemia: never.  ?Pancreatitis: never.  ?Other: she also took insulin for 6 months, in 2016; CRI, vaginitis, and edema limit rx options; she did not tolerate Trulicity (nausea), Jardiance, (vaginitis), or repaglinide (diarrhea); she declines multiple daily injections.: [facility] rx's Ozempic, then Victoza, the Mounjaro, then back to Ozempic.   ?Interval history: she brings a record of her cbg's which I have reviewed today.  cbg varies from 74-155.  pt states she feels well in general.  She takes 32 units/d.  She seldom has hypoglycemia, and these episodes are mild.   ?Past Medical History:  ?Diagnosis Date  ? Abnormal nuclear stress test 01/02/2020  ? Arthritis   ? Bilateral leg edema 01/02/2020  ? Bursitis   ? hips  ? C. difficile colitis 12/2017  ? CAD (coronary artery disease) 01/06/2020  ? CKD (chronic kidney disease), stage III ([place])   ? Complication of anesthesia   ? "spinal did not work with second c section"  ? Diabetes mellitus ([place]) 11/19/2015  ? Diabetes mellitus, type 2 ([place])   ? diet controlled - has Rx for Glymipride but has not started taking yet  ? Dyslipidemia 10/21/2014  ? GERD (gastroesophageal reflux disease)   ? History of nonmelanoma skin cancer   ? History of skin cancer   ? Hypercholesterolemia 12/11/2019  ? Hyperlipidemia   ? Hypertension   ? Hypertensive disorder 10/21/2014  ? Lower extremity edema   ? Mixed hyperlipidemia 01/02/2020  ? OAB (overactive bladder)   ? Obesity (BMI 30-39.9) 05/12/2020  ? OSA (obstructive sleep apnea) 05/12/2020  ? Osteoarthritis of right knee 05/15/2015  ? Osteoarthrosis, unspecified whether generalized or localized, involving lower leg 10/21/2014  ? Osteoporosis   ?  Over weight   ? Primary osteoarthritis of left knee 12/18/2014  ? S/P knee replacement 12/18/2014  ? S/P total knee replacement using cement 05/15/2015  ? Sleep apnea   ? Tachycardia 12/11/2019  ? ? ?Past Surgical History:  ?Procedure Laterality Date  ? ABDOMINAL HYSTERECTOMY  2005  ? CATARACT EXTRACTION    ? CESAREAN SECTION    ? x2  ? colonscopy     ? CORONARY STENT INTERVENTION N/A 01/06/2020  ? Procedure: CORONARY STENT INTERVENTION;  Surgeon: Lorretta Harp, MD;  Location: [place] [facility];  Service: Cardiovascular;  Laterality: N/A;  ? EYE SURGERY    ? bilat cataract surgery 2015  ? INTRAVASCULAR PRESSURE WIRE/FFR STUDY N/A 01/06/2020  ? Procedure: INTRAVASCULAR PRESSURE WIRE/FFR STUDY;  Surgeon: Lorretta Harp, MD;  Location: [place] [facility];  Service: Cardiovascular;  Laterality: N/A;  ? LEFT HEART CATH AND CORONARY ANGIOGRAPHY N/A 01/06/2020  ? Procedure: LEFT HEART CATH AND CORONARY ANGIOGRAPHY;  Surgeon: Lorretta Harp, MD;  Location: [place] [facility];  Service: Cardiovascular;  Laterality: N/A;  ? LEFT HEART CATH AND CORONARY ANGIOGRAPHY N/A 01/29/2021  ? Procedure: LEFT HEART CATH AND CORONARY ANGIOGRAPHY;  Surgeon: Jettie Booze, MD;  Location: [place] [facility];  Service: Cardiovascular;  Laterality: N/A;  ? TOTAL KNEE ARTHROPLASTY Left 12/18/2014  ? Procedure: TOTAL LEFT KNEE ARTHROPLASTY;  Surgeon: Sydnee Cabal, MD;  Location: [facility];  Service: Orthopedics;  Laterality: Left;  ? TOTAL KNEE ARTHROPLASTY Right 05/15/2015  ? Procedure: RIGHT TOTAL KNEE ARTHROPLASTY;  Surgeon: Sydnee Cabal, MD;  Location: [facility];  Service: Orthopedics;  Laterality: Right;  ? UTERINE FIBROID SURGERY    ? ? ?Social History  ? ?Socioeconomic History  ? Marital status: Married  ?  Spouse name: Not on file  ? Number of children: Not on file  ? Years of education: Not on file  ? Highest education level: Not on file  ?Occupational History  ? Occupation: retired  ?Tobacco Use  ? Smoking status: Never  ?  Smokeless tobacco: Never  ?Vaping Use  ? Vaping Use: Never used  ?Substance and Sexual Activity  ? Alcohol use: Yes  ?  Comment: rare  ? Drug use: No  ? Sexual activity: Not on file  ?Other Topics Concern  ? Not on file  ?Social History Narrative  ? Not on file  ? ?Social Determinants of Health  ? ?Financial Resource Strain: Not on file  ?Food Insecurity: Not on file  ?Transportation Needs: Not on file  ?Physical Activity: Not on file  ?Stress: Not on file  ?Social Connections: Not on file  ?Intimate Partner Violence: Not on file  ? ? ?Current Outpatient Medications on File Prior to Visit  ?Medication Sig Dispense Refill  ? Accu-Chek Softclix Lancets lancets USE TO CHECK BLOOD SUGAR TWICE DAILY 200 each 3  ? acetaminophen (TYLENOL) 650 MG CR tablet Take 1,300 mg by mouth at bedtime.    ? aspirin EC 81 MG tablet Take 1 tablet (81 mg total) by mouth in the morning.    ? Blood Glucose Monitoring Suppl (ACCU-CHEK GUIDE ME) w/Device KIT 1 each by Does not apply route 2 (two) times daily. E11.9 1 kit 0  ? carvedilol (COREG) 3.125 MG tablet Take 1 tablet (3.125 mg total) by mouth 2 (two) times daily with a meal. 180 tablet 3  ? Cholecalciferol (VITAMIN D) 2000 UNITS CAPS Take 2,000 Units by mouth every morning.     ? clopidogrel (PLAVIX) 75 MG tablet Take 1 tablet (75 mg total) by mouth daily with breakfast. 90 tablet 3  ? Coenzyme Q10 (COQ10) 200 MG CAPS Take 200 mg by mouth in the morning.    ? DULoxetine (CYMBALTA) 20 MG capsule Take 40 mg by mouth at bedtime.     ? furosemide (LASIX) 40 MG tablet Take 1 tablet (40 mg total) by mouth 3 (three) times a week. Tuesdays, Thursdays & Saturdays in the morning 30 tablet 6  ? glucose blood (ACCU-CHEK GUIDE) test strip Use to check BS 2x a day 200 each 12  ? Insulin Pen Needle (BD PEN NEEDLE NANO U/F) 32G X 4 MM MISC 1 each by Does not apply route daily. 100 each 3  ? Multiple Vitamin (MULTIVITAMIN WITH MINERALS) TABS tablet Take 1 tablet by mouth every morning.     ? Omega-3  Fatty Acids (RA FISH OIL) 1400 MG CPDR Take 1,400 mg by mouth 2 (two) times daily.    ? pantoprazole (PROTONIX) 40 MG tablet Take 40 mg by mouth daily.    ? potassium chloride SA (KLOR-CON M20) 20 MEQ tablet Take 1 tablet (20 mEq total) by mouth 3 (three) times a week. Tuesdays, Thursdays & Saturdays in the morning 30 tablet 6  ? ranolazine (RANEXA) 1000 MG SR tablet Take 1 tablet (1,000 mg total) by mouth 2 (two) times daily. 180 tablet 3  ? rosuvastatin (CRESTOR) 40 MG tablet Take 1 tablet (  40 mg total) by mouth in the morning.    ? saccharomyces boulardii (FLORASTOR) 250 MG capsule Take 250 mg by mouth in the morning.    ? Semaglutide, 2 MG/DOSE, 8 MG/3ML SOPN Inject 2 mg as directed once a week. 3 mL 0  ? trospium (SANCTURA) 20 MG tablet Take 20 mg by mouth 2 (two) times daily.  3  ? vitamin C (ASCORBIC ACID) 250 MG tablet Take 250 mg by mouth in the morning.    ? nitroGLYCERIN (NITROSTAT) 0.4 MG SL tablet Place 1 tablet (0.4 mg total) under the tongue every 5 (five) minutes x 3 doses as needed for chest pain. 30 tablet 1  ? ?No current facility-administered medications on file prior to visit.  ? ? ?Allergies  ?Allergen Reactions  ? Amoxicillin-Pot Clavulanate   ?  Other reaction(s): vomiting  ? Aspirin   ?  Other reaction(s): reflux and nervousness  ? Colchicine   ?  Stomach cramps ?Other reaction(s): severe GI upset  ? Diclofenac   ?  Oral causes stomach cramps  ? Dulaglutide Diarrhea and Nausea And Vomiting  ?  Other reaction(s): diarrhea  ? Empagliflozin   ?  Other reaction(s): yeast infection  ? Ezetimibe   ?  Muscle pain ?Other reaction(s): muscle aches  ? Imdur [Isosorbide Nitrate] Other (See Comments)  ?  hypotensive  ? Imipramine Hcl   ?  Other reaction(s): rash  ? Isosorbide Other (See Comments)  ?  Causes Hypotension  ? Metformin   ?  Other reaction(s): stomach upset  ? Repaglinide   ?  Other reaction(s): swelling and GI upset  ? Sitagliptin-Metformin Hcl Nausea And Vomiting  ?  Lightheaded  ?Other  reaction(s): GI upset  ? Sulfamethoxazole-Trimethoprim Itching  ?  Other reaction(s): itching  ? Imipramine Rash  ? Tape Rash  ?  Paper tape ok to use   ? ? ?Family History  ?Problem Relation Age of Onset  ? Diab

## 2021-10-25 ENCOUNTER — Ambulatory Visit
Admission: RE | Admit: 2021-10-25 | Discharge: 2021-10-25 | Disposition: A | Discharge status: Home / Self Care | Payer: Medicare PPO | Source: Ambulatory Visit | Attending: Obstetrics and Gynecology | Admitting: Obstetrics and Gynecology

## 2021-10-25 DIAGNOSIS — Z1231 Encounter for screening mammogram for malignant neoplasm of breast: Secondary | ICD-10-CM | POA: Diagnosis not present

## 2021-10-27 ENCOUNTER — Ambulatory Visit (INDEPENDENT_AMBULATORY_CARE_PROVIDER_SITE_OTHER): Payer: Medicare PPO | Admitting: Adult Health

## 2021-10-27 ENCOUNTER — Other Ambulatory Visit: Payer: Self-pay

## 2021-10-27 ENCOUNTER — Encounter (INDEPENDENT_AMBULATORY_CARE_PROVIDER_SITE_OTHER): Payer: Self-pay | Admitting: Adult Health

## 2021-10-27 VITALS — BP 112/73 | HR 77 | Temp 98.0°F | Ht 63.0 in | Wt 198.0 lb

## 2021-10-27 DIAGNOSIS — R55 Syncope and collapse: Secondary | ICD-10-CM | POA: Diagnosis not present

## 2021-10-27 DIAGNOSIS — E669 Obesity, unspecified: Secondary | ICD-10-CM | POA: Diagnosis not present

## 2021-10-27 DIAGNOSIS — Z6835 Body mass index (BMI) 35.0-35.9, adult: Secondary | ICD-10-CM | POA: Diagnosis not present

## 2021-10-27 DIAGNOSIS — E1169 Type 2 diabetes mellitus with other specified complication: Secondary | ICD-10-CM | POA: Diagnosis not present

## 2021-10-27 DIAGNOSIS — R200 Anesthesia of skin: Secondary | ICD-10-CM

## 2021-10-27 DIAGNOSIS — R202 Paresthesia of skin: Secondary | ICD-10-CM | POA: Diagnosis not present

## 2021-10-27 DIAGNOSIS — G5691 Unspecified mononeuropathy of right upper limb: Secondary | ICD-10-CM

## 2021-10-27 DIAGNOSIS — Z794 Long term (current) use of insulin: Secondary | ICD-10-CM

## 2021-11-01 NOTE — Progress Notes (Signed)
? ? ? ?Chief Complaint:  ? ?OBESITY ?Pamela Love is here to discuss her progress with her obesity treatment plan along with follow-up of her obesity related diagnoses. [name] is on the Category 2 Plan and states she is following her eating plan approximately 65% of the time. [name] states she is doing dance/cardio lab for 60-75 minutes 2-3 times per week. ? ?Today's visit was #: 50 ?Starting weight: 227 lbs ?Starting date: 03/17/2020 ?Today's weight: 198 lbs ?Today's date: 10/27/2021 ?Total lbs lost to date: 29 lbs ?Total lbs lost since last in-office visit: 2 lbs ? ?Interim History:  ?[name] had an appointment with Endo on 10/14/2021.   ?On 10/14/2021, Endo/Dr. Loanne Drilling - he reduced Toujeo to 18 U each am. ?Continued on Ozempic 2mg  once week. ?Per pt- Dr. Loanne Drilling is planning on retiring soon. ?Provided names of other endocrinologists at [facility] to convert care to. ? ?Subjective:  ? ?1. Type 2 diabetes mellitus with other specified complication, with long-term current use of insulin ([place]) ?On 10/14/2021, Endo/Dr. Loanne Drilling - he reduced Toujeo to 18 U each am. ?Continued on Ozempic 2mg  once week. ?3 doses Ozempic 2 mg- denies mass in neck, dysphagia, abd pain, or N/V/constipation. ?She injects on Monday Ozempic 2 mg - and reports the following SE: flatulence, dyspepsia.  ?With use of R wrist brace-she reports decrease in R hand numbness/tingling. ?Ambulatory fasting BG 121-132, no hypoglycemia. ? ?2. Numbness and tingling of right hand ?Symptoms resolved with right hand brace. ? ?3. Near syncope ?On 10/23/2021 - middle of day / 1400 - leaning over bed returning items from bins underneath.   ?Dizziness - fell forward - no complete LOC.  ?Able to get up and check BG, which was 98, husband returned home 45-50 minutes later - BP stable (unsure of reading). ?She denies chest pain occurring with this episode.   ?She did experience jaw pain and took 2x Pepto Bismol - resolved jaw pain. ? ?Assessment/Plan:  ? ?1. Type 2 diabetes mellitus  with other specified complication, with long-term current use of insulin ([place]) ?Follow-up with Dr. Loanne Drilling in May 2023. ? ?2. Neuropathy of right hand ?Continue with brace on right hand. ? ?3. Near syncope ?Monitor BP and change positions SLOWLY. ?If near syncope/syncope occurs again- f/u with PCP/Cardiology. ?She verbalized understanding and agreement. ? ?4. Obesity with current BMI of 35.2 ? ?Pamela Love is currently in the action stage of change. As such, her goal is to continue with weight loss efforts. She has agreed to the Category 2 Plan.  ? ?Walk after largest meal.   ? ?Follow- with Cardiology if syncopal events continue. ? ?Exercise goals:  As is. ? ?Behavioral modification strategies: increasing lean protein intake, decreasing simple carbohydrates, meal planning and cooking strategies, keeping healthy foods in the home, and planning for success. ? ?[name] has agreed to follow-up with our clinic in 2-3 weeks. She was informed of the importance of frequent follow-up visits to maximize her success with intensive lifestyle modifications for her multiple health conditions.  ? ?Objective:  ? ?Blood pressure 112/73, pulse 77, temperature 98 ?F (36.7 ?C), height 5\' 3"  (1.6 m), weight 198 lb (89.8 kg), SpO2 99 %. ?Body mass index is 35.07 kg/m?. ? ?General: Cooperative, alert, well developed, in no acute distress. ?HEENT: Conjunctivae and lids unremarkable. ?Cardiovascular: Regular rhythm.  ?Lungs: Normal work of breathing. ?Neurologic: No focal deficits.  ? ?Lab Results  ?Component Value Date  ? CREATININE 1.02 (H) 01/26/2021  ? BUN 28 (H) 01/26/2021  ? NA 140 01/26/2021  ?  K 4.6 01/26/2021  ? CL 103 01/26/2021  ? CO2 23 01/26/2021  ? ?Lab Results  ?Component Value Date  ? ALT 25 04/21/2020  ? AST 25 04/21/2020  ? ALKPHOS 104 04/21/2020  ? BILITOT 0.5 04/21/2020  ? ?Lab Results  ?Component Value Date  ? HGBA1C 5.8 (A) 10/14/2021  ? HGBA1C 6.1 (A) 07/12/2021  ? HGBA1C 5.8 (A) 04/12/2021  ? HGBA1C 7.0 (A) 12/10/2020  ?  HGBA1C 6.3 (A) 09/02/2020  ? ?Lab Results  ?Component Value Date  ? TSH 1.71 05/30/2018  ? ?Lab Results  ?Component Value Date  ? CHOL 134 12/16/2020  ? HDL 41 12/16/2020  ? [place] 68 12/16/2020  ? TRIG 142 12/16/2020  ? CHOLHDL 3.3 12/16/2020  ? ?Lab Results  ?Component Value Date  ? VD25OH 63.9 04/21/2020  ? ?Lab Results  ?Component Value Date  ? WBC 7.0 01/26/2021  ? HGB 13.8 01/26/2021  ? HCT 41.6 01/26/2021  ? MCV 92 01/26/2021  ? PLT 229 01/26/2021  ? ?Attestation Statements:  ? ?Reviewed by clinician on day of visit: allergies, medications, problem list, medical history, surgical history, family history, social history, and previous encounter notes. ? ?Time spent on visit including pre-visit chart review and post-visit care and charting was 30 minutes.  ? ?I, Water quality scientist, CMA, am acting as Location manager for [name], NP. ? ?I have reviewed the above documentation for accuracy and completeness, and I agree with the above. -  Jenesis Martin d. Buffi Ewton, NP-C ?

## 2021-11-13 ENCOUNTER — Encounter (HOSPITAL_BASED_OUTPATIENT_CLINIC_OR_DEPARTMENT_OTHER): Payer: Self-pay | Admitting: Emergency Medicine

## 2021-11-13 ENCOUNTER — Emergency Department (HOSPITAL_BASED_OUTPATIENT_CLINIC_OR_DEPARTMENT_OTHER)
Admission: EM | Admit: 2021-11-13 | Discharge: 2021-11-13 | Disposition: A | Discharge status: Home / Self Care | Payer: Medicare PPO | Attending: Student | Admitting: Student

## 2021-11-13 ENCOUNTER — Other Ambulatory Visit: Payer: Self-pay

## 2021-11-13 DIAGNOSIS — I129 Hypertensive chronic kidney disease with stage 1 through stage 4 chronic kidney disease, or unspecified chronic kidney disease: Secondary | ICD-10-CM | POA: Insufficient documentation

## 2021-11-13 DIAGNOSIS — Z7902 Long term (current) use of antithrombotics/antiplatelets: Secondary | ICD-10-CM | POA: Diagnosis not present

## 2021-11-13 DIAGNOSIS — I2510 Atherosclerotic heart disease of native coronary artery without angina pectoris: Secondary | ICD-10-CM | POA: Insufficient documentation

## 2021-11-13 DIAGNOSIS — Z85828 Personal history of other malignant neoplasm of skin: Secondary | ICD-10-CM | POA: Diagnosis not present

## 2021-11-13 DIAGNOSIS — E1122 Type 2 diabetes mellitus with diabetic chronic kidney disease: Secondary | ICD-10-CM | POA: Insufficient documentation

## 2021-11-13 DIAGNOSIS — R6884 Jaw pain: Secondary | ICD-10-CM | POA: Diagnosis not present

## 2021-11-13 DIAGNOSIS — Z79899 Other long term (current) drug therapy: Secondary | ICD-10-CM | POA: Insufficient documentation

## 2021-11-13 DIAGNOSIS — R0789 Other chest pain: Secondary | ICD-10-CM | POA: Diagnosis not present

## 2021-11-13 DIAGNOSIS — R079 Chest pain, unspecified: Secondary | ICD-10-CM | POA: Diagnosis not present

## 2021-11-13 DIAGNOSIS — Z7982 Long term (current) use of aspirin: Secondary | ICD-10-CM | POA: Insufficient documentation

## 2021-11-13 DIAGNOSIS — Z96651 Presence of right artificial knee joint: Secondary | ICD-10-CM | POA: Insufficient documentation

## 2021-11-13 DIAGNOSIS — N183 Chronic kidney disease, stage 3 unspecified: Secondary | ICD-10-CM | POA: Diagnosis not present

## 2021-11-13 DIAGNOSIS — Z96652 Presence of left artificial knee joint: Secondary | ICD-10-CM | POA: Insufficient documentation

## 2021-11-13 LAB — CBC
HCT: 41.3 % (ref 36.0–46.0)
Hemoglobin: 14 g/dL (ref 12.0–15.0)
MCH: 31.5 pg (ref 26.0–34.0)
MCHC: 33.9 g/dL (ref 30.0–36.0)
MCV: 93 fL (ref 80.0–100.0)
Platelets: 243 10*3/uL (ref 150–400)
RBC: 4.44 MIL/uL (ref 3.87–5.11)
RDW: 12.1 % (ref 11.5–15.5)
WBC: 7.5 10*3/uL (ref 4.0–10.5)
nRBC: 0 % (ref 0.0–0.2)

## 2021-11-13 LAB — BASIC METABOLIC PANEL
Anion gap: 10 (ref 5–15)
BUN: 25 mg/dL — ABNORMAL HIGH (ref 8–23)
CO2: 27 mmol/L (ref 22–32)
Calcium: 10.1 mg/dL (ref 8.9–10.3)
Chloride: 100 mmol/L (ref 98–111)
Creatinine, Ser: 1.09 mg/dL — ABNORMAL HIGH (ref 0.44–1.00)
GFR, Estimated: 54 mL/min — ABNORMAL LOW (ref >60)
Glucose, Bld: 134 mg/dL — ABNORMAL HIGH (ref 70–99)
Potassium: 3.9 mmol/L (ref 3.5–5.1)
Sodium: 137 mmol/L (ref 135–145)

## 2021-11-13 LAB — TROPONIN I (HIGH SENSITIVITY)
Troponin I (High Sensitivity): 3 ng/L (ref <18)
Troponin I (High Sensitivity): 3 ng/L (ref <18)

## 2021-11-13 MED ORDER — LIDOCAINE VISCOUS HCL 2 % MT SOLN: 15.0000 mL | OROMUCOSAL | 0 refills | Status: DC | PRN

## 2021-11-13 MED ORDER — MAALOX MAX 400-400-40 MG/5ML PO SUSP: 15.0000 mL | Freq: Four times a day (QID) | ORAL | 0 refills | Status: DC | PRN

## 2021-11-13 NOTE — ED Triage Notes (Signed)
Pt c/o "angina attack." Pt c/o pain to left chest pain and pain to Bilateral jaw x 1 hour. Patient tried pepto bismouth at 6:21 pm and 6:25 pm. Pt took Nitro at 6:27 pm, 6:32 pm, and 6:37 pm with some relief in jaw around 7:52 pm. Pt began having some relief to left chest around 7:57 pm. Pain associated with nausea and diaphoresis. ?

## 2021-11-13 NOTE — ED Notes (Signed)
Family at bedside. 

## 2021-11-13 NOTE — ED Provider Notes (Signed)
?[facility] ?Provider Note ? ?CSN: [id number] ?Arrival date & time: 11/13/21 1907 ? ?Chief Complaint(s) ?Chest Pain ? ?HPI ?Pamela Love is a 71 y.o. female with PMH CAD status post DES placement, CKD, GERD who presents emergency department for evaluation of chest pain.  Patient states that she has had these "attacks" before where she will have left upper quadrant abdominal pain versus left-sided chest pain and associated bilateral jaw pain.  She states that she has been speaking with her cardiologist about whether this is reflux related or truly cardiac disease.  She was instructed by her cardiologist to take Pepto-Bismol prior to nitroglycerin if another attack occurred.  Today she was in the car when she had a sudden onset episode similar to her previous ones.  She took her Pepto-Bismol with no improvement.  She waited approximately 20 minutes prior to taking 3 consecutive nitro tablets and comes to the emergency department for cardiac evaluation.  She states her pain is completely resolved. ? ? ?Chest Pain ? ?Past Medical History ?Past Medical History:  ?Diagnosis Date  ? Abnormal nuclear stress test 01/02/2020  ? Arthritis   ? Bilateral leg edema 01/02/2020  ? Bursitis   ? hips  ? C. difficile colitis 12/2017  ? CAD (coronary artery disease) 01/06/2020  ? CKD (chronic kidney disease), stage III ([place])   ? Complication of anesthesia   ? "spinal did not work with second c section"  ? Diabetes mellitus ([place]) 11/19/2015  ? Diabetes mellitus, type 2 ([place])   ? diet controlled - has Rx for Glymipride but has not started taking yet  ? Dyslipidemia 10/21/2014  ? GERD (gastroesophageal reflux disease)   ? History of nonmelanoma skin cancer   ? History of skin cancer   ? Hypercholesterolemia 12/11/2019  ? Hyperlipidemia   ? Hypertension   ? Hypertensive disorder 10/21/2014  ? Lower extremity edema   ? Mixed hyperlipidemia 01/02/2020  ? OAB (overactive bladder)   ? Obesity (BMI 30-39.9) 05/12/2020   ? OSA (obstructive sleep apnea) 05/12/2020  ? Osteoarthritis of right knee 05/15/2015  ? Osteoarthrosis, unspecified whether generalized or localized, involving lower leg 10/21/2014  ? Osteoporosis   ? Over weight   ? Primary osteoarthritis of left knee 12/18/2014  ? S/P knee replacement 12/18/2014  ? S/P total knee replacement using cement 05/15/2015  ? Sleep apnea   ? Tachycardia 12/11/2019  ? ?Patient Active Problem List  ? Diagnosis Date Noted  ? Neuropathy of right hand 10/27/2021  ? Near syncope 10/27/2021  ? Angina pectoris ([place]) 03/23/2021  ? Class 3 severe obesity with serious comorbidity and body mass index (BMI) of 40.0 to 44.9 in adult [facility] 03/23/2021  ? Essential hypertension 03/10/2021  ? Arthritis   ? Bursitis   ? CKD (chronic kidney disease), stage III ([place])   ? Complication of anesthesia   ? Diabetes mellitus, type 2 ([place])   ? GERD (gastroesophageal reflux disease)   ? History of nonmelanoma skin cancer   ? History of skin cancer   ? Hyperlipidemia   ? Hypertension associated with type 2 diabetes mellitus ([place])   ? Lower extremity edema   ? OAB (overactive bladder)   ? Osteoporosis   ? Over weight   ? Sleep apnea   ? OSA (obstructive sleep apnea) 05/12/2020  ? Obesity (BMI 30-39.9) 05/12/2020  ? CAD (coronary artery disease) 01/06/2020  ? Abnormal nuclear stress test 01/02/2020  ? Bilateral leg edema 01/02/2020  ? Mixed hyperlipidemia 01/02/2020  ?  Hypercholesterolemia 12/11/2019  ? Tachycardia 12/11/2019  ? C. difficile colitis 12/2017  ? Diabetes mellitus ([place]) 11/19/2015  ? S/P total knee replacement using cement 05/15/2015  ? Osteoarthritis of right knee 05/15/2015  ? Primary osteoarthritis of left knee 12/18/2014  ? S/P knee replacement 12/18/2014  ? Osteoarthrosis, unspecified whether generalized or localized, involving lower leg 10/21/2014  ? Hypertensive disorder 10/21/2014  ? Dyslipidemia 10/21/2014  ? ?Home Medication(s) ?Prior to Admission medications   ?Medication Sig Start Date End Date  Taking? Authorizing Provider  ?alum & mag hydroxide-simeth (MAALOX MAX) 400-400-40 MG/5ML suspension Take 15 mLs by mouth every 6 (six) hours as needed for indigestion. 11/13/21  Yes Allyn Bartelson, MD  ?lidocaine (XYLOCAINE) 2 % solution Use as directed 15 mLs in the mouth or throat as needed for mouth pain. 11/13/21  Yes Eoin Willden, MD  ?Accu-Chek Softclix Lancets lancets USE TO CHECK BLOOD SUGAR TWICE DAILY 01/01/21   Renato Shin, MD  ?acetaminophen (TYLENOL) 650 MG CR tablet Take 1,300 mg by mouth at bedtime.    [provider]  ?aspirin EC 81 MG tablet Take 1 tablet (81 mg total) by mouth in the morning. 01/29/21   Jettie Booze, MD  ?Blood Glucose Monitoring Suppl (ACCU-CHEK GUIDE ME) w/Device KIT 1 each by Does not apply route 2 (two) times daily. E11.9 12/23/20   Renato Shin, MD  ?carvedilol (COREG) 3.125 MG tablet Take 1 tablet (3.125 mg total) by mouth 2 (two) times daily with a meal. 03/10/21   Buford Dresser, MD  ?Cholecalciferol (VITAMIN D) 2000 UNITS CAPS Take 2,000 Units by mouth every morning.     [provider]  ?clopidogrel (PLAVIX) 75 MG tablet Take 1 tablet (75 mg total) by mouth daily with breakfast. 03/10/21   Buford Dresser, MD  ?Coenzyme Q10 (COQ10) 200 MG CAPS Take 200 mg by mouth in the morning.    [provider]  ?DULoxetine (CYMBALTA) 20 MG capsule Take 40 mg by mouth at bedtime.     [provider]  ?furosemide (LASIX) 40 MG tablet Take 1 tablet (40 mg total) by mouth 3 (three) times a week. Tuesdays, Thursdays & Saturdays in the morning 03/10/21   Buford Dresser, MD  ?glucose blood (ACCU-CHEK GUIDE) test strip Use to check BS 2x a day 12/24/20   Renato Shin, MD  ?insulin glargine, 2 Unit Dial, (TOUJEO MAX SOLOSTAR) 300 UNIT/ML Solostar Pen Inject 18 Units into the skin in the morning. And 4 mm pen needles 2/day 10/14/21   Renato Shin, MD  ?Insulin Pen Needle (BD PEN NEEDLE NANO U/F) 32G X 4 MM MISC 1 each by Does not  apply route daily. 04/12/21   Renato Shin, MD  ?Multiple Vitamin (MULTIVITAMIN WITH MINERALS) TABS tablet Take 1 tablet by mouth every morning.     [provider]  ?nitroGLYCERIN (NITROSTAT) 0.4 MG SL tablet Place 1 tablet (0.4 mg total) under the tongue every 5 (five) minutes x 3 doses as needed for chest pain. 03/10/21 06/08/21  Buford Dresser, MD  ?Omega-3 Fatty Acids (RA FISH OIL) 1400 MG CPDR Take 1,400 mg by mouth 2 (two) times daily.    [provider]  ?pantoprazole (PROTONIX) 40 MG tablet Take 40 mg by mouth daily.    [provider]  ?potassium chloride SA (KLOR-CON M20) 20 MEQ tablet Take 1 tablet (20 mEq total) by mouth 3 (three) times a week. Tuesdays, Thursdays & Saturdays in the morning 03/10/21   Buford Dresser, MD  ?ranolazine (RANEXA) 1000  MG SR tablet Take 1 tablet (1,000 mg total) by mouth 2 (two) times daily. 03/10/21   Buford Dresser, MD  ?rosuvastatin (CRESTOR) 40 MG tablet Take 1 tablet (40 mg total) by mouth in the morning. 01/29/21   Jettie Booze, MD  ?saccharomyces boulardii (FLORASTOR) 250 MG capsule Take 250 mg by mouth in the morning.    [provider]  ?Semaglutide, 2 MG/DOSE, 8 MG/3ML SOPN Inject 2 mg as directed once a week. 10/11/21   Mellody Dance, DO  ?trospium (SANCTURA) 20 MG tablet Take 20 mg by mouth 2 (two) times daily. 09/25/14   [provider]  ?vitamin C (ASCORBIC ACID) 250 MG tablet Take 250 mg by mouth in the morning.    [provider]  ?                                                                                                                                  ?Past Surgical History ?Past Surgical History:  ?Procedure Laterality Date  ? ABDOMINAL HYSTERECTOMY  2005  ? CATARACT EXTRACTION    ? CESAREAN SECTION    ? x2  ? colonscopy     ? CORONARY STENT INTERVENTION N/A 01/06/2020  ? Procedure: CORONARY STENT INTERVENTION;  Surgeon: Lorretta Harp, MD;  Location: [facility];   Service: Cardiovascular;  Laterality: N/A;  ? EYE SURGERY    ? bilat cataract surgery 2015  ? INTRAVASCULAR PRESSURE WIRE/FFR STUDY N/A 01/06/2020  ? Procedure: INTRAVASCULAR PRESSURE WIRE/FFR STUDY;

## 2021-11-14 ENCOUNTER — Other Ambulatory Visit (INDEPENDENT_AMBULATORY_CARE_PROVIDER_SITE_OTHER): Payer: Self-pay | Admitting: Family Medicine

## 2021-11-14 DIAGNOSIS — E1169 Type 2 diabetes mellitus with other specified complication: Secondary | ICD-10-CM

## 2021-11-15 ENCOUNTER — Encounter (INDEPENDENT_AMBULATORY_CARE_PROVIDER_SITE_OTHER): Payer: Self-pay | Admitting: Family Medicine

## 2021-11-15 ENCOUNTER — Ambulatory Visit (INDEPENDENT_AMBULATORY_CARE_PROVIDER_SITE_OTHER): Payer: Medicare PPO | Admitting: Family Medicine

## 2021-11-15 VITALS — BP 122/71 | HR 69 | Temp 97.9°F | Ht 63.0 in | Wt 199.0 lb

## 2021-11-15 DIAGNOSIS — Z6835 Body mass index (BMI) 35.0-35.9, adult: Secondary | ICD-10-CM

## 2021-11-15 DIAGNOSIS — Z794 Long term (current) use of insulin: Secondary | ICD-10-CM

## 2021-11-15 DIAGNOSIS — Z7985 Long-term (current) use of injectable non-insulin antidiabetic drugs: Secondary | ICD-10-CM | POA: Diagnosis not present

## 2021-11-15 DIAGNOSIS — E1169 Type 2 diabetes mellitus with other specified complication: Secondary | ICD-10-CM | POA: Diagnosis not present

## 2021-11-15 DIAGNOSIS — E669 Obesity, unspecified: Secondary | ICD-10-CM

## 2021-11-15 MED ORDER — TIRZEPATIDE 5 MG/0.5ML ~~LOC~~ SOAJ: 5.0000 mg | SUBCUTANEOUS | 0 refills | Status: DC

## 2021-11-17 ENCOUNTER — Encounter (HOSPITAL_BASED_OUTPATIENT_CLINIC_OR_DEPARTMENT_OTHER): Payer: Self-pay

## 2021-11-17 NOTE — Telephone Encounter (Signed)
Please advise 

## 2021-11-19 ENCOUNTER — Telehealth: Payer: Self-pay | Admitting: Cardiovascular Disease

## 2021-11-19 NOTE — Telephone Encounter (Signed)
What problem are you experiencing? Patient is developing blisters below her nostrils. She read about using liners for her mask, she wants to know if she could do this? ? ?Who is your medical equipment company? [employer] ? ? ?Please route to the sleep study assistant.  ?

## 2021-11-25 NOTE — Progress Notes (Signed)
? ? ? ?Chief Complaint:  ? ?OBESITY ?Pamela Love is here to discuss her progress with her obesity treatment plan along with follow-up of her obesity related diagnoses. Pamela Love is on Pamela Category 2 Plan and states she is following her eating plan approximately 60% of Pamela time. Pamela Love states she is dancing/cardio for 60 minutes 2 times per week. ? ?Today's visit was #: 85 ?Starting weight: 227 lbs ?Starting date: 03/17/2020 ?Today's weight: 199 lbs ?Today's date: 11/15/2021 ?Total lbs lost to date: 7 ?Total lbs lost since last in-office visit: 0 ? ?Interim History: Pamela Love is typically sees Pamela Love in Pamela past. Patient notes numbness in Pamela right hand and was changed from Pamela Love to Pamela Love. However, after use of carpal tunnel brace, her symptoms resolved. She desires to go back on Pamela Love as she feels it caused less GERD/heartburn symptoms for her and worked better for weight loss.  ? ?Subjective:  ? ?1. Type 2 diabetes mellitus with other specified complication, with long-term current use of insulin (Pamela Love) ?Pamela Love's Endocrinologist, Pamela Love, is leaving Pamela practice and she needs additional options. I recommended that she ask Pamela Love at her follow up office visit with him in Pamela very near future.  ? ?Assessment/Plan:  ?No orders of Pamela defined types were placed in this encounter. ? ? ?Medications Discontinued During This Encounter  ?Medication Reason  ? Semaglutide, 2 MG/DOSE, 8 MG/3ML SOPN   ?  ? ?Meds ordered this encounter  ?Medications  ? tirzepatide [facility]) 5 MG/0.5ML Pen  ?  Sig: Inject 5 mg into Pamela skin once a week.  ?  Dispense:  2 mL  ?  Refill:  0  ?  ? ?1. Type 2 diabetes mellitus with other specified complication, with long-term current use of insulin (Pamela Love) ?Pamela Love agreed to discontinue Pamela Love and start Pamela Love 5 mg weekly with no refills. I reminded Pamela patient that we have her on this to aid with weight loss but, diabetes mellitus disease management is per Endocrinologist and/or PCP. Reminded Pamela patient  of risks and benefits of Pamela Love and how they can worsen GERD symptoms as well. We will decrease her dose if she develops any.  ? ?- tirzepatide (Pamela Love) 5 MG/0.5ML Pen; Inject 5 mg into Pamela skin once a week.  Dispense: 2 mL; Refill: 0 ? ?2. Obesity with current BMI of 35.3 ?Pamela Love is currently in Pamela action stage of change. As such, her goal is to continue with weight loss efforts. She has agreed to Pamela Category 2 Plan.  ? ?Exercise goals: Increase as tolerated 3-4 days per week.  ? ?Behavioral modification strategies: increasing lean protein intake, decreasing simple carbohydrates, and planning for success. ? ?Pamela Love has agreed to follow-up with our clinic in 2 to 3 weeks with Pamela Marble, NP. She was informed of Pamela importance of frequent follow-up visits to maximize her success with intensive lifestyle modifications for her multiple Love conditions.  ? ?Objective:  ? ?Blood pressure 122/71, pulse 69, temperature 97.9 ?F (36.6 ?C), height 5\' 3"  (1.6 m), weight 199 lb (90.3 kg), SpO2 97 %. ?Body mass index is 35.25 kg/m?. ? ?General: Cooperative, alert, well developed, in no acute distress. ?HEENT: Conjunctivae and lids unremarkable. ?Cardiovascular: Regular rhythm.  ?Lungs: Normal work of breathing. ?Neurologic: No focal deficits.  ? ?Lab Results  ?Component Value Date  ? CREATININE 1.09 (H) 11/13/2021  ? BUN 25 (H) 11/13/2021  ? NA 137 11/13/2021  ? K 3.9 11/13/2021  ? CL 100 11/13/2021  ? CO2 27 11/13/2021  ? ?  Lab Results  ?Component Value Date  ? ALT 25 04/21/2020  ? AST 25 04/21/2020  ? ALKPHOS 104 04/21/2020  ? BILITOT 0.5 04/21/2020  ? ?Lab Results  ?Component Value Date  ? HGBA1C 5.8 (A) 10/14/2021  ? HGBA1C 6.1 (A) 07/12/2021  ? HGBA1C 5.8 (A) 04/12/2021  ? HGBA1C 7.0 (A) 12/10/2020  ? HGBA1C 6.3 (A) 09/02/2020  ? ?No results found for: INSULIN ?Lab Results  ?Component Value Date  ? TSH 1.71 05/30/2018  ? ?Lab Results  ?Component Value Date  ? CHOL 134 12/16/2020  ? HDL 41 12/16/2020  ? Advance 68  12/16/2020  ? TRIG 142 12/16/2020  ? CHOLHDL 3.3 12/16/2020  ? ?Lab Results  ?Component Value Date  ? VD25OH 63.9 04/21/2020  ? ?Lab Results  ?Component Value Date  ? WBC 7.5 11/13/2021  ? HGB 14.0 11/13/2021  ? HCT 41.3 11/13/2021  ? MCV 93.0 11/13/2021  ? PLT 243 11/13/2021  ? ?No results found for: IRON, TIBC, FERRITIN ? ?Obesity Behavioral Intervention:  ? ?Approximately 15 minutes were spent on Pamela discussion below. ? ?ASK: ?We discussed Pamela diagnosis of obesity with Pamela Love today and Jannah agreed to give [place] permission to discuss obesity behavioral modification therapy today. ? ?ASSESS: ?Enslie has Pamela diagnosis of obesity and her BMI today is 35.3. Jylian is in Pamela action stage of change.  ? ?ADVISE: ?Lusia was educated on Pamela multiple Love risks of obesity as well as Pamela benefit of weight loss to improve her Love. She was advised of Pamela need for long term treatment and Pamela importance of lifestyle modifications to improve her current Love and to decrease her risk of future Love problems. ? ?AGREE: ?Multiple dietary modification options and treatment options were discussed and Cionna agreed to follow Pamela recommendations documented in Pamela above note. ? ?ARRANGE: ?Sheridyn was educated on Pamela importance of frequent visits to treat obesity as outlined per CMS and USPSTF guidelines and agreed to schedule her next follow up appointment today. ? ?Attestation Statements:  ? ?Reviewed by clinician on day of visit: allergies, medications, problem list, medical history, surgical history, family history, social history, and previous encounter notes. ? ? ?I, Trixie Dredge, am acting as transcriptionist for [employer], DO. ? ?I have reviewed Pamela above documentation for accuracy and completeness, and I agree with Pamela above. [name], D.O. ? ?Pamela [place] was signed into law in 2016 which includes Pamela topic of electronic Love records.  This provides immediate access to information in MyChart.   This includes consultation notes, operative notes, office notes, lab results and pathology reports.  If you have any questions about what you read please let us know at your next visit so we can discuss your concerns and take corrective action if need be.  We are right here with you. ? ? ?

## 2021-11-26 NOTE — Telephone Encounter (Signed)
My chart message sent to patient in reference to using CPAP faceliners. ?

## 2021-11-29 ENCOUNTER — Ambulatory Visit (INDEPENDENT_AMBULATORY_CARE_PROVIDER_SITE_OTHER): Payer: Medicare PPO | Admitting: Adult Health

## 2021-11-29 ENCOUNTER — Encounter (INDEPENDENT_AMBULATORY_CARE_PROVIDER_SITE_OTHER): Payer: Self-pay | Admitting: Adult Health

## 2021-11-29 VITALS — BP 134/74 | HR 81 | Temp 97.5°F | Ht 63.0 in | Wt 203.0 lb

## 2021-11-29 DIAGNOSIS — Z794 Long term (current) use of insulin: Secondary | ICD-10-CM | POA: Diagnosis not present

## 2021-11-29 DIAGNOSIS — Z6841 Body Mass Index (BMI) 40.0 and over, adult: Secondary | ICD-10-CM | POA: Diagnosis not present

## 2021-11-29 DIAGNOSIS — G4733 Obstructive sleep apnea (adult) (pediatric): Secondary | ICD-10-CM

## 2021-11-29 DIAGNOSIS — E1169 Type 2 diabetes mellitus with other specified complication: Secondary | ICD-10-CM | POA: Diagnosis not present

## 2021-11-30 ENCOUNTER — Other Ambulatory Visit (INDEPENDENT_AMBULATORY_CARE_PROVIDER_SITE_OTHER): Payer: Self-pay | Admitting: Interventional Cardiology

## 2021-12-09 NOTE — Progress Notes (Signed)
? ? ? ?Chief Complaint:  ? ?OBESITY ?Pamela Love is here to discuss her progress with her obesity treatment plan along with follow-up of her obesity related diagnoses. Pamela Love is on the Category 2 Plan and states she is following her eating plan approximately 50% of the time. Pamela Love states she is doing cardio for 60-90 minutes 2-3 times per week. ? ?Today's visit was #: 69 ?Starting weight: 227 lbs ?Starting date: 03/17/2020 ?Today's weight: 203 lbs ?Today's date: 11/29/2021 ?Total lbs lost to date: 24 lbs ?Total lbs lost since last in-office visit: 0 ? ?Interim History:  ?Due to travel and celebrations: She has consumed the following foods "off plan"- Fudge, peanut butter bars, and birthday cake. Pamela Love will consume supplemental protein shakes when she is unable to consume a meal on plan. ? ?She had an ED visit on 11/13/2021 due to chest pain reviewed encounter, notes,and labs with her.  ?MDM: ?Patient seen emergency room for evaluation of chest and jaw pain.  Physical exam is unremarkable.  Laboratory evaluation unremarkable including troponin and delta troponin.  ECG nonischemic.  Patient was observed for approximately 3 hours in the emergency department and had no return of her chest pain.  I spoke with cardiology with a plan of changing her Pepto-Bismol to a GI cocktail with viscous lidocaine and Maalox as I am concerned that the Pepto-Bismol likely does not act fast enough to be a true marker of improvement.  Cardiologist on-call agrees with this plan and despite heart score of 5, patient is safe for discharge with timely cardiology follow-up.  I did discussion with the patient and using shared decision making, we all agreed with this plan.  Thus she was discharged and given strict return precautions of which she voiced understanding. ? ?Subjective:  ? ?1. Type 2 diabetes mellitus with other specified complication, with long-term current use of insulin ([place]) ?Pamela Love's fasting blood glucose was in the range of 98-172, mostly  130-140.  ?Post Prandial was upper 90s.  ?Ozempic caused diarrhea and abdominal pain.  ?Ozempic replaced with Mounjaro 5 mg she has 2 doses at this strength. ?She denies mass in neck, dysphagia, dyspepsia, persistent hoarseness, abd pain, or N/V/Constipation. ?Pamela Love states brace helps with neuropathy of R hand. ?She notes increased  anginal sx's in the last several weeks. ?She denies acute cardiac sx's at present. ? ?2. OSA (obstructive sleep apnea) ?Pamela Love is unable to tolerate CPAP. She is interested in implantable CPAP.  ?She is followed by Dr. Claiborne Billings  for OSA management. ? ?Assessment/Plan:  ? ?1. Type 2 diabetes mellitus with other specified complication, with long-term current use of insulin ([place]) ?Pamela Love will continue Mounjaro 5 mg for 2 more dose then she will increase Mounjaro to 7.5 mg. We will monitor for side effects.  ? ?2. OSA (obstructive sleep apnea) ?Pamela Love will follow up with Dr. Claiborne Billings.  ? ?3. Obesity with current BMI of 36.0 ?Pamela Love is currently in the action stage of change. As such, her goal is to continue with weight loss efforts. She has agreed to the Category 2 Plan.  ? ?Exercise goals:  As is.  ? ?Behavioral modification strategies: increasing lean protein intake, decreasing simple carbohydrates, meal planning and cooking strategies, keeping healthy foods in the home, and planning for success. ? ?Pamela Love has agreed to follow-up with our clinic in 2-3 weeks. She was informed of the importance of frequent follow-up visits to maximize her success with intensive lifestyle modifications for her multiple health conditions.  ? ?Objective:  ? ?Blood pressure  134/74, pulse 81, temperature (!) 97.5 ?F (36.4 ?C), height 5\' 3"  (1.6 m), weight 203 lb (92.1 kg), SpO2 98 %. ?Body mass index is 35.96 kg/m?. ? ?General: Cooperative, alert, well developed, in no acute distress. ?HEENT: Conjunctivae and lids unremarkable. ?Cardiovascular: Regular rhythm.  ?Lungs: Normal work of breathing. ?Neurologic: No focal  deficits.  ? ?Lab Results  ?Component Value Date  ? CREATININE 1.09 (H) 11/13/2021  ? BUN 25 (H) 11/13/2021  ? NA 137 11/13/2021  ? K 3.9 11/13/2021  ? CL 100 11/13/2021  ? CO2 27 11/13/2021  ? ?Lab Results  ?Component Value Date  ? ALT 25 04/21/2020  ? AST 25 04/21/2020  ? ALKPHOS 104 04/21/2020  ? BILITOT 0.5 04/21/2020  ? ?Lab Results  ?Component Value Date  ? HGBA1C 5.8 (A) 10/14/2021  ? HGBA1C 6.1 (A) 07/12/2021  ? HGBA1C 5.8 (A) 04/12/2021  ? HGBA1C 7.0 (A) 12/10/2020  ? HGBA1C 6.3 (A) 09/02/2020  ? ?No results found for: INSULIN ?Lab Results  ?Component Value Date  ? TSH 1.71 05/30/2018  ? ?Lab Results  ?Component Value Date  ? CHOL 134 12/16/2020  ? HDL 41 12/16/2020  ? [place] 68 12/16/2020  ? TRIG 142 12/16/2020  ? CHOLHDL 3.3 12/16/2020  ? ?Lab Results  ?Component Value Date  ? VD25OH 63.9 04/21/2020  ? ?Lab Results  ?Component Value Date  ? WBC 7.5 11/13/2021  ? HGB 14.0 11/13/2021  ? HCT 41.3 11/13/2021  ? MCV 93.0 11/13/2021  ? PLT 243 11/13/2021  ? ?No results found for: IRON, TIBC, FERRITIN ? ?Attestation Statements:  ? ?Reviewed by clinician on day of visit: allergies, medications, problem list, medical history, surgical history, family history, social history, and previous encounter notes. ? ?Time spent on visit including pre-visit chart review and post-visit care and charting was 29 minutes.  ? ?I, [name] Bark, RMA, am acting as Location manager for [name], NP. ? ?I have reviewed the above documentation for accuracy and completeness, and I agree with the above. -  Jouri Threat d. Deontray Hunnicutt, NP-C ?

## 2021-12-13 ENCOUNTER — Encounter (HOSPITAL_BASED_OUTPATIENT_CLINIC_OR_DEPARTMENT_OTHER): Payer: Self-pay | Admitting: Cardiology

## 2021-12-13 ENCOUNTER — Ambulatory Visit: Payer: Medicare PPO | Admitting: Endocrinology

## 2021-12-13 ENCOUNTER — Ambulatory Visit (HOSPITAL_BASED_OUTPATIENT_CLINIC_OR_DEPARTMENT_OTHER): Payer: Medicare PPO | Admitting: Cardiology

## 2021-12-13 VITALS — BP 124/82 | HR 98

## 2021-12-13 DIAGNOSIS — E1169 Type 2 diabetes mellitus with other specified complication: Secondary | ICD-10-CM

## 2021-12-13 DIAGNOSIS — E782 Mixed hyperlipidemia: Secondary | ICD-10-CM | POA: Diagnosis not present

## 2021-12-13 DIAGNOSIS — Z794 Long term (current) use of insulin: Secondary | ICD-10-CM | POA: Diagnosis not present

## 2021-12-13 DIAGNOSIS — I25118 Atherosclerotic heart disease of native coronary artery with other forms of angina pectoris: Secondary | ICD-10-CM | POA: Diagnosis not present

## 2021-12-13 DIAGNOSIS — E669 Obesity, unspecified: Secondary | ICD-10-CM

## 2021-12-13 DIAGNOSIS — I10 Essential (primary) hypertension: Secondary | ICD-10-CM | POA: Diagnosis not present

## 2021-12-13 MED ORDER — ROSUVASTATIN CALCIUM 40 MG PO TABS: 40.0000 mg | ORAL_TABLET | Freq: Every morning | ORAL | 3 refills | Status: DC

## 2021-12-13 NOTE — Progress Notes (Signed)
Cardiology Office Note:    Date:  12/13/2021   ID:  Pamela Love, DOB [birth year], MRN [id number]  PCP:  Pamela Neer, MD  Cardiologist:  Pamela Dresser, MD  Referring MD: Pamela Neer, MD   CC: Follow-up   History of Present Illness:    Pamela Love is a 71 y.o. female with a hx of arthritis, CAD, CKD stage IIIa, diabetes type 2,  hyperlipidemia, hypertension, obesity, and OSA on CPAP who is seen for follow-up. She was previously a patient of Dr. Harriet Love.     Cardiovascular risk factors: Prior clinical ASCVD: Angina attacks, CAD. Last cath from 01/2021 reviewed together today. From May 11-June 27th she had 5 anginal attacks. Since her stent placement in 01/2021 she denies any recurring attacks. Comorbid conditions: CKD stage IIIa, diabetes type 2, hyperlipidemia, hypertension Metabolic syndrome/Obesity: BMI 35, working on weight loss with [facility] Exercise level: Exercises at cardiac rehab facility (past year), usually for 1 hour and then becomes fatigued. Still becomes fatigued and short of breath climbing a hill. She is participating in [facility]. Has lost 33 pounds since last September. Current diet: Special occasion glass of wine or frozen margarita.  Today: Doing similarly overall. Reviewed ER visit from 11/13/21. Had one of her chest pain attacks, ER workup notable for normal hsTn (3>3), normal ECG. Told to try GI cocktail during one of her episodes, has not tried this yet. Has been responsive to nitro, see prior notes re: whether this is GI vs. Cardiac. Workup at this ER visit suggests noncardiac.   Has been back and forth between [place]. Reviewed her pattern, noted more of her "attacks" on ozempic vs other meds. Noted that Ozempic tears up her stomach. Has asked to go back on the [facility]. She will monitor her symptoms and discuss with Dr. Brigitte Love.   Has OSA, hates her CPAP. Asking about inspire system. Sees  Dr. Claiborne Billings in July, will ask him about it.  Denies shortness of breath at rest or with normal exertion. No PND, orthopnea, LE edema or unexpected weight gain. No syncope or palpitations.    Past Medical History:  Diagnosis Date   Abnormal nuclear stress test 01/02/2020   Arthritis    Bilateral leg edema 01/02/2020   Bursitis    hips   C. difficile colitis 12/2017   CAD (coronary artery disease) 01/06/2020   CKD (chronic kidney disease), stage III (HCC)    Complication of anesthesia    "spinal did not work with second c section"   Diabetes mellitus ([place]) 11/19/2015   Diabetes mellitus, type 2 ([place])    diet controlled - has Rx for Glymipride but has not started taking yet   Dyslipidemia 10/21/2014   GERD (gastroesophageal reflux disease)    History of nonmelanoma skin cancer    History of skin cancer    Hypercholesterolemia 12/11/2019   Hyperlipidemia    Hypertension    Hypertensive disorder 10/21/2014   Lower extremity edema    Mixed hyperlipidemia 01/02/2020   OAB (overactive bladder)    Obesity (BMI 30-39.9) 05/12/2020   OSA (obstructive sleep apnea) 05/12/2020   Osteoarthritis of right knee 05/15/2015   Osteoarthrosis, unspecified whether generalized or localized, involving lower leg 10/21/2014   Osteoporosis    Over weight    Primary osteoarthritis of left knee 12/18/2014   S/P knee replacement 12/18/2014   S/P total knee replacement using cement 05/15/2015   Sleep apnea    Tachycardia 12/11/2019  Past Surgical History:  Procedure Laterality Date   ABDOMINAL HYSTERECTOMY  2005   CATARACT EXTRACTION     CESAREAN SECTION     x2   colonscopy      CORONARY STENT INTERVENTION N/A 01/06/2020   Procedure: CORONARY STENT INTERVENTION;  Surgeon: Pamela Harp, MD;  Location: [facility];  Service: Cardiovascular;  Laterality: N/A;   EYE SURGERY     bilat cataract surgery 2015   INTRAVASCULAR PRESSURE WIRE/FFR STUDY N/A 01/06/2020   Procedure: INTRAVASCULAR PRESSURE WIRE/FFR  STUDY;  Surgeon: Pamela Harp, MD;  Location: [place] [facility];  Service: Cardiovascular;  Laterality: N/A;   LEFT HEART CATH AND CORONARY ANGIOGRAPHY N/A 01/06/2020   Procedure: LEFT HEART CATH AND CORONARY ANGIOGRAPHY;  Surgeon: Pamela Harp, MD;  Location: [facility];  Service: Cardiovascular;  Laterality: N/A;   LEFT HEART CATH AND CORONARY ANGIOGRAPHY N/A 01/29/2021   Procedure: LEFT HEART CATH AND CORONARY ANGIOGRAPHY;  Surgeon: Pamela Booze, MD;  Location: [place] [facility];  Service: Cardiovascular;  Laterality: N/A;   TOTAL KNEE ARTHROPLASTY Left 12/18/2014   Procedure: TOTAL LEFT KNEE ARTHROPLASTY;  Surgeon: Pamela Cabal, MD;  Location: [facility];  Service: Orthopedics;  Laterality: Left;   TOTAL KNEE ARTHROPLASTY Right 05/15/2015   Procedure: RIGHT TOTAL KNEE ARTHROPLASTY;  Surgeon: Pamela Cabal, MD;  Location: [facility];  Service: Orthopedics;  Laterality: Right;   UTERINE FIBROID SURGERY      Current Medications: Current Outpatient Medications on File Prior to Visit  Medication Sig   Accu-Chek Softclix Lancets lancets USE TO CHECK BLOOD SUGAR TWICE DAILY   acetaminophen (TYLENOL) 650 MG CR tablet Take 1,300 mg by mouth at bedtime.   alum & mag hydroxide-simeth (MAALOX MAX) 400-400-40 MG/5ML suspension Take 15 mLs by mouth every 6 (six) hours as needed for indigestion.   aspirin EC 81 MG tablet Take 1 tablet (81 mg total) by mouth in the morning.   Blood Glucose Monitoring Suppl (ACCU-CHEK GUIDE ME) w/Device KIT 1 each by Does not apply route 2 (two) times daily. E11.9   carvedilol (COREG) 3.125 MG tablet Take 1 tablet (3.125 mg total) by mouth 2 (two) times daily with a meal.   Cholecalciferol (VITAMIN D) 2000 UNITS CAPS Take 2,000 Units by mouth every morning.    clopidogrel (PLAVIX) 75 MG tablet Take 1 tablet (75 mg total) by mouth daily with breakfast.   Coenzyme Q10 (COQ10) 200 MG CAPS Take 200 mg by mouth in the morning.   DULoxetine (CYMBALTA) 20 MG  capsule Take 40 mg by mouth at bedtime.    furosemide (LASIX) 40 MG tablet Take 1 tablet (40 mg total) by mouth 3 (three) times a week. Tuesdays, Thursdays & Saturdays in the morning   glucose blood (ACCU-CHEK GUIDE) test strip Use to check BS 2x a day   insulin glargine, 2 Unit Dial, (TOUJEO MAX SOLOSTAR) 300 UNIT/ML Solostar Pen Inject 18 Units into the skin in the morning. And 4 mm pen needles 2/day   Insulin Pen Needle (BD PEN NEEDLE NANO U/F) 32G X 4 MM MISC 1 each by Does not apply route daily.   lidocaine (XYLOCAINE) 2 % solution Use as directed 15 mLs in the mouth or throat as needed for mouth pain.   Multiple Vitamin (MULTIVITAMIN WITH MINERALS) TABS tablet Take 1 tablet by mouth every morning.    Omega-3 Fatty Acids (RA FISH OIL) 1400 MG CPDR Take 1,400 mg by mouth 2 (two) times daily.  pantoprazole (PROTONIX) 40 MG tablet Take 40 mg by mouth daily.   potassium chloride SA (KLOR-CON M20) 20 MEQ tablet Take 1 tablet (20 mEq total) by mouth 3 (three) times a week. Tuesdays, Thursdays & Saturdays in the morning   ranolazine (RANEXA) 1000 MG SR tablet Take 1 tablet (1,000 mg total) by mouth 2 (two) times daily.   saccharomyces boulardii (FLORASTOR) 250 MG capsule Take 250 mg by mouth in the morning.   tirzepatide [facility]) 5 MG/0.5ML Pen Inject 5 mg into the skin once a week.   trospium (SANCTURA) 20 MG tablet Take 20 mg by mouth 2 (two) times daily.   vitamin C (ASCORBIC ACID) 250 MG tablet Take 250 mg by mouth in the morning.   nitroGLYCERIN (NITROSTAT) 0.4 MG SL tablet Place 1 tablet (0.4 mg total) under the tongue every 5 (five) minutes x 3 doses as needed for chest pain.   No current facility-administered medications on file prior to visit.     Allergies:   Amoxicillin-pot clavulanate, Aspirin, Colchicine, Diclofenac, Dulaglutide, Empagliflozin, Ezetimibe, Imdur [isosorbide nitrate], Imipramine hcl, Isosorbide, Metformin, Repaglinide, Sitagliptin-metformin hcl,  Sulfamethoxazole-trimethoprim, Imipramine, and Tape   Social History   Tobacco Use   Smoking status: Never   Smokeless tobacco: Never  Vaping Use   Vaping Use: Never used  Substance Use Topics   Alcohol use: Yes    Comment: rare   Drug use: No    Family History: family history includes Cancer in her mother; Congenital heart disease in her son; Diabetes in her maternal grandfather; Heart attack in her father, maternal grandfather, paternal uncle, and sister; Heart disease in her father and mother; Hyperlipidemia in her father and mother; Hypertension in her father and mother; Sleep apnea in her father; Sudden death in her father.  ROS:   Please see the history of present illness. All other systems are reviewed and negative.    EKGs/Labs/Other Studies Reviewed:    The following studies were reviewed today:  LHC 01/29/2021: Prox LAD to Mid LAD lesion is 40-50% stenosed. Angiographic appeareance improved compared to prior cath in 2021 when DFR was negative. Patent RCA stents. The left ventricular systolic function is normal. LV end diastolic pressure is normal. The left ventricular ejection fraction is 55-65% by visual estimate. There is no aortic valve stenosis.   Continue medical therapy.  Echo 01/07/2021:  1. Left ventricular ejection fraction, by estimation, is 60 to 65%. The  left ventricle has normal function. The left ventricle has no regional  wall motion abnormalities. Left ventricular diastolic parameters are  consistent with Grade I diastolic  dysfunction (impaired relaxation).   2. Right ventricular systolic function is normal. The right ventricular  size is normal. There is normal pulmonary artery systolic pressure.   3. The mitral valve is normal in structure. Mild mitral valve  regurgitation. No evidence of mitral stenosis.   4. The aortic valve is normal in structure. There is mild calcification  of the aortic valve. There is mild thickening of the aortic  valve. Aortic  valve regurgitation is not visualized. No aortic stenosis is present.   5. The inferior vena cava is normal in size with greater than 50%  respiratory variability, suggesting right atrial pressure of 3 mmHg.   Lexiscan Myoview 01/01/2020: The left ventricular ejection fraction is normal (55-65%). Nuclear stress EF: 62%. There was no ST segment deviation noted during stress. Defect 1: There is a small defect of mild severity present in the basal anterior and mid anterior location.  Findings consistent with ischemia. This is an intermediate risk study. Moderate area of mild ischemia involving LAD teritory. Normal LVEF.  EKG:  EKG is personally reviewed.   09/10/2021: NSR, PRWP, 81 bpm 03/10/2021: not ordered  Recent Labs: 01/26/2021: Magnesium 2.0 11/13/2021: BUN 25; Creatinine, Ser 1.09; Hemoglobin 14.0; Platelets 243; Potassium 3.9; Sodium 137   Recent Lipid Panel    Component Value Date/Time   CHOL 134 12/16/2020 0957   TRIG 142 12/16/2020 0957   HDL 41 12/16/2020 0957   CHOLHDL 3.3 12/16/2020 0957   LDLCALC 68 12/16/2020 0957    Physical Exam:    VS:  BP 124/82   Love 98   SpO2 96%     Wt Readings from Last 3 Encounters:  11/29/21 203 lb (92.1 kg)  11/15/21 199 lb (90.3 kg)  11/13/21 198 lb (89.8 kg)    GEN: Well nourished, well developed in no acute distress HEENT: Normal, moist mucous membranes NECK: No JVD CARDIAC: regular rhythm, normal S1 and S2, no rubs or gallops. No murmur. VASCULAR: Radial and DP pulses 2+ bilaterally. No carotid bruits RESPIRATORY:  Clear to auscultation without rales, wheezing or rhonchi  ABDOMEN: Soft, non-tender, non-distended MUSCULOSKELETAL:  Ambulates independently SKIN: Warm and dry, no edema NEUROLOGIC:  Alert and oriented x 3. No focal neuro deficits noted. PSYCHIATRIC:  Normal affect     ASSESSMENT:    1. Coronary artery disease involving native coronary artery of native heart with other form of angina pectoris  ([place])   2. Essential hypertension   3. Type 2 diabetes mellitus with other specified complication, with long-term current use of insulin (HCC)   4. Obesity, Class II, BMI 35-39.9   5. Mixed hyperlipidemia      PLAN:    CAD, with prior PCI Mixed hyperlipidemia Intermittent chest pain, unlikely to be angina based on workup -Aspirin 81 mg -statin: continue rosuvastatin 40 mg daily -LDL goal <70, LDL 68 -> 77. She will work on lifestyle. Working on weight loss -antianginals: beta blocker (carvedilol), ranolazine. Not on isosorbide as it made her hypotensive -Given type II diabetes and CAD, she is on GLP1RA. Tolerates mounjaro better than ozempic -has prescription for sublingual nitroglycerin, counseled on lifespan of this medication -counseled on diet recommendations and AHA exercise guidelines, below -risk factor modification, as below -counseled on red flag warning signs that need immediate medical attention   Hypertension:  -near goal of <130/80, continue current meds. Working on weight loss  OSA: -continue CPAP. Asking about alternatives, deferred to Dr. Claiborne Billings  Obesity, BMI 36 Cardiac risk counseling and prevention recommendations: -recommend heart healthy/Mediterranean diet, with whole grains, fruits, vegetable, fish, lean meats, nuts, and olive oil. Limit salt. -recommend moderate walking, 3-5 times/week for 30-50 minutes each session. Aim for at least 150 minutes.week. Goal should be pace of 3 miles/hours, or walking 1.5 miles in 30 minutes -recommend avoidance of tobacco products. Avoid excess alcohol.  Plan for follow up: 6 months or sooner as needed.  Pamela Dresser, MD, PhD, [facility]    Medication Adjustments/Labs and Tests Ordered: Current medicines are reviewed at length with the patient today.  Concerns regarding medicines are outlined above.   No orders of the defined types were placed in this encounter.  Meds ordered this  encounter  Medications   rosuvastatin (CRESTOR) 40 MG tablet    Sig: Take 1 tablet (40 mg total) by mouth in the morning.    Dispense:  90 tablet    Refill:  3   Patient Instructions  Medication Instructions:  Your Physician recommend you continue on your current medication as directed.    *If you need a refill on your cardiac medications before your next appointment, please call your pharmacy*   Lab Work: None ordered today   Testing/Procedures: None ordered today   Follow-Up: At [facility], you and your health needs are our priority.  As part of our continuing mission to provide you with exceptional heart care, we have created designated Provider Care Teams.  These Care Teams include your primary Cardiologist (physician) and Advanced Practice Providers (APPs -  Physician Assistants and Nurse Practitioners) who all work together to provide you with the care you need, when you need it.  We recommend signing up for the patient portal called "MyChart".  Sign up information is provided on this After Visit Summary.  MyChart is used to connect with patients for Virtual Visits (Telemedicine).  Patients are able to view lab/test results, encounter notes, upcoming appointments, etc.  Non-urgent messages can be sent to your provider as well.   To learn more about what you can do with MyChart, go to [email or web address].    Your next appointment:   6 month(s)  The format for your next appointment:   In Person  Provider:   Buford Dresser, MD{   Important Information About Sugar         Signed, Pamela Dresser, MD PhD 12/13/2021     [place]

## 2021-12-13 NOTE — Patient Instructions (Signed)
Medication Instructions:  ?Your Physician recommend you continue on your current medication as directed.   ? ?*If you need a refill on your cardiac medications before your next appointment, please call your pharmacy* ? ? ?Lab Work: ?None ordered today ? ? ?Testing/Procedures: ?None ordered today ? ? ?Follow-Up: ?At [facility], you and your health needs are our priority.  As part of our continuing mission to provide you with exceptional heart care, we have created designated Provider Care Teams.  These Care Teams include your primary Cardiologist (physician) and Advanced Practice Providers (APPs -  Physician Assistants and Nurse Practitioners) who all work together to provide you with the care you need, when you need it. ? ?We recommend signing up for the patient portal called "MyChart".  Sign up information is provided on this After Visit Summary.  MyChart is used to connect with patients for Virtual Visits (Telemedicine).  Patients are able to view lab/test results, encounter notes, upcoming appointments, etc.  Non-urgent messages can be sent to your provider as well.   ?To learn more about what you can do with MyChart, go to [email or web address].   ? ?Your next appointment:   ?6 month(s) ? ?The format for your next appointment:   ?In Person ? ?Provider:   ?Bridgette Christopher, MD{ ? ? ?Important Information About Sugar ? ? ? ? ? ? ?

## 2021-12-15 ENCOUNTER — Encounter (INDEPENDENT_AMBULATORY_CARE_PROVIDER_SITE_OTHER): Payer: Self-pay | Admitting: Family Medicine

## 2021-12-15 ENCOUNTER — Ambulatory Visit (INDEPENDENT_AMBULATORY_CARE_PROVIDER_SITE_OTHER): Payer: Medicare PPO | Admitting: Family Medicine

## 2021-12-15 VITALS — BP 124/74 | HR 76 | Temp 98.0°F | Ht 63.0 in | Wt 198.0 lb

## 2021-12-15 DIAGNOSIS — Z794 Long term (current) use of insulin: Secondary | ICD-10-CM

## 2021-12-15 DIAGNOSIS — E1169 Type 2 diabetes mellitus with other specified complication: Secondary | ICD-10-CM | POA: Diagnosis not present

## 2021-12-15 DIAGNOSIS — Z7985 Long-term (current) use of injectable non-insulin antidiabetic drugs: Secondary | ICD-10-CM

## 2021-12-15 DIAGNOSIS — Z6835 Body mass index (BMI) 35.0-35.9, adult: Secondary | ICD-10-CM | POA: Diagnosis not present

## 2021-12-15 DIAGNOSIS — E669 Obesity, unspecified: Secondary | ICD-10-CM | POA: Diagnosis not present

## 2021-12-15 MED ORDER — TIRZEPATIDE 7.5 MG/0.5ML ~~LOC~~ SOAJ: 7.5000 mg | SUBCUTANEOUS | 0 refills | Status: DC

## 2021-12-24 NOTE — Progress Notes (Unsigned)
Chief Complaint:   OBESITY Pamela Love is here to discuss her progress with her obesity treatment plan along with follow-up of her obesity related diagnoses. Pamela Love is on the Category 2 Plan and states she is following her eating plan approximately 65% of the time. Pamela Love states she is doing water aerobics for 50 minutes, dance for 60 minutes, and cardio for 60 minutes 3 times per week.  Today's visit was #: 54 Starting weight: 227 lbs Starting date: 8/17/(2021 Today's weight: 198 lbs Today's date: 12/15/2021 Total lbs lost to date: 29 lbs Total lbs lost since last in-office visit: 5 lbs  Interim History: Pamela Love's last appointment was with Pamela Marble, NP. Pamela Love is doing well with her meal plan and has no issues, but you like to increase her dose of GLP-1. Per Pamela Love, she checked with her Endocrinologist and he did not feel neuropathy from [facility]. She is going to the beach for 1 week in the near future.  Subjective:   1. Type 2 diabetes mellitus with other specified complication, with long-term current use of insulin (HCC) Pamela Love is currently taking 5 mg of Mounjaro and tolerated well. She states that her carpal tunnel has nothing to do with Pamela Love ans she wishes to increase Mounjaro to 7.5 mg weekly. Vinnia is still at 18 units of Toujeo and blood sugar = 130-180's is the highest and 89 the lowest. She reports to still have some hunger and difficult time during the day.   Assessment/Plan:  No orders of the defined types were placed in this encounter.   Medications Discontinued During This Encounter  Medication Reason   tirzepatide Pamela Love) 5 MG/0.5ML Pen Dose change     Meds ordered this encounter  Medications   tirzepatide (MOUNJARO) 7.5 MG/0.5ML Pen    Sig: Inject 7.5 mg into the skin once a week.    Dispense:  2 mL    Refill:  0     1. Type 2 diabetes mellitus with other specified complication, with long-term current use of insulin (Pamela Love) Pamela Love will increase dose of  Mounjaro to 7.5 once weekly. She will continue with the same dose of insulin (with evaluate in the near future), especially with an upcoming vacation. Pamela Love will also follow up with her Endocrinologist in 2 months.  - tirzepatide (MOUNJARO) 7.5 MG/0.5ML Pen; Inject 7.5 mg into the skin once a week.  Dispense: 2 mL; Refill: 0  2. Obesity, Current BMI 35.1 Pamela Love is currently in the action stage of change. As such, her goal is to continue with weight loss efforts. She has agreed to the Category 2 Plan.   Exercise goals: As is.  Behavioral modification strategies: dealing with family or coworker sabotage and travel eating strategies.  Pamela Love has agreed to follow-up with our clinic in 2 weeks. She was informed of the importance of frequent follow-up visits to maximize her success with intensive lifestyle modifications for her multiple health conditions.   Objective:   Blood pressure 124/74, pulse 76, temperature 98 F (36.7 C), height 5\' 3"  (1.6 m), weight 198 lb (89.8 kg), SpO2 97 %. Body mass index is 35.07 kg/m.  General: Cooperative, alert, well developed, in no acute distress. HEENT: Conjunctivae and lids unremarkable. Cardiovascular: Regular rhythm.  Lungs: Normal work of breathing. Neurologic: No focal deficits.   Lab Results  Component Value Date   CREATININE 1.09 (H) 11/13/2021   BUN 25 (H) 11/13/2021   NA 137 11/13/2021   K 3.9 11/13/2021   CL 100 11/13/2021  CO2 27 11/13/2021   Lab Results  Component Value Date   ALT 25 04/21/2020   AST 25 04/21/2020   ALKPHOS 104 04/21/2020   BILITOT 0.5 04/21/2020   Lab Results  Component Value Date   HGBA1C 5.8 (A) 10/14/2021   HGBA1C 6.1 (A) 07/12/2021   HGBA1C 5.8 (A) 04/12/2021   HGBA1C 7.0 (A) 12/10/2020   HGBA1C 6.3 (A) 09/02/2020   No results found for: INSULIN Lab Results  Component Value Date   TSH 1.71 05/30/2018   Lab Results  Component Value Date   CHOL 134 12/16/2020   HDL 41 12/16/2020   LDLCALC 68  12/16/2020   TRIG 142 12/16/2020   CHOLHDL 3.3 12/16/2020   Lab Results  Component Value Date   VD25OH 63.9 04/21/2020   Lab Results  Component Value Date   WBC 7.5 11/13/2021   HGB 14.0 11/13/2021   HCT 41.3 11/13/2021   MCV 93.0 11/13/2021   PLT 243 11/13/2021   No results found for: IRON, TIBC, FERRITIN  Obesity Behavioral Intervention:   Approximately 15 minutes were spent on the discussion below.  ASK: We discussed the diagnosis of obesity with Dairyn today and Donique agreed to give [place] permission to discuss obesity behavioral modification therapy today.  ASSESS: Emunah has the diagnosis of obesity and her BMI today is 35.1. Quatisha is in the action stage of change.   ADVISE: Vicki was educated on the multiple health risks of obesity as well as the benefit of weight loss to improve her health. She was advised of the need for long term treatment and the importance of lifestyle modifications to improve her current health and to decrease her risk of future health problems.  AGREE: Multiple dietary modification options and treatment options were discussed and Philomene agreed to follow the recommendations documented in the above note.  ARRANGE: Elley was educated on the importance of frequent visits to treat obesity as outlined per CMS and USPSTF guidelines and agreed to schedule her next follow up appointment today.  Attestation Statements:   Reviewed by clinician on day of visit: allergies, medications, problem list, medical history, surgical history, family history, social history, and previous encounter notes.    ILennette Bihari, CMA, am acting as transcriptionist for Dr. Raliegh Scarlet, DO  I have reviewed the above documentation for accuracy and completeness, and I agree with the above. [name], D.O.  The [place] was signed into law in 2016 which includes the topic of electronic health records.  This provides immediate access to information in MyChart.   This includes consultation notes, operative notes, office notes, lab results and pathology reports.  If you have any questions about what you read please let us know at your next visit so we can discuss your concerns and take corrective action if need be.  We are right here with you.

## 2021-12-26 ENCOUNTER — Encounter (HOSPITAL_BASED_OUTPATIENT_CLINIC_OR_DEPARTMENT_OTHER): Payer: Self-pay | Admitting: Cardiology

## 2021-12-29 ENCOUNTER — Ambulatory Visit (INDEPENDENT_AMBULATORY_CARE_PROVIDER_SITE_OTHER): Payer: Medicare PPO | Admitting: Adult Health

## 2021-12-29 ENCOUNTER — Encounter (INDEPENDENT_AMBULATORY_CARE_PROVIDER_SITE_OTHER): Payer: Self-pay | Admitting: Adult Health

## 2021-12-29 VITALS — BP 126/74 | HR 84 | Temp 98.1°F | Ht 63.0 in | Wt 199.0 lb

## 2021-12-29 DIAGNOSIS — Z794 Long term (current) use of insulin: Secondary | ICD-10-CM

## 2021-12-29 DIAGNOSIS — Z6835 Body mass index (BMI) 35.0-35.9, adult: Secondary | ICD-10-CM

## 2021-12-29 DIAGNOSIS — E1169 Type 2 diabetes mellitus with other specified complication: Secondary | ICD-10-CM

## 2021-12-29 DIAGNOSIS — E669 Obesity, unspecified: Secondary | ICD-10-CM | POA: Diagnosis not present

## 2021-12-29 DIAGNOSIS — Z7985 Long-term (current) use of injectable non-insulin antidiabetic drugs: Secondary | ICD-10-CM

## 2022-01-04 NOTE — Progress Notes (Signed)
Chief Complaint:   OBESITY Kenyetta is here to discuss her progress with her obesity treatment plan along with follow-up of her obesity related diagnoses. Abbigail is on the Category 2 Plan and states she is following her eating plan approximately 60% of the time. Mitchelle states she is not currently exercising.  Today's visit was #: 26 Starting weight: 227 lbs Starting date: 8/17/(2021 Today's weight: 199 lbs Today's date: 12/29/21 Total lbs lost to date: 28 lbs Total lbs lost since last in-office visit: +1  Interim History:  At last office visit (12/15/2021) Mounjaro increased from 5 mg to 7.5 mg. First dose of Mounjaro 7.5 mg-12/20/2021, second dose of Mounjaro 12/27/2021-continued on Toujeo 18 units each a.m. Her endocrinologist/Dr. Loanne Drilling has left the practice. She will establish with Dr .Gherge/endocrinologist this summer.  Subjective:   1. Type 2 diabetes mellitus with other specified complication, with long-term current use of insulin ([facility]) At last office visit (12/15/2021) Mounjaro increased from 5 mg to 7.5 mg. First dose of Mounjaro 7.5 mg-12/20/2021, second dose of Mounjaro 12/27/2021-continued on Toujeo 18 units each a.m. She denies sx's of hypoglycemia. Her endocrinologist/Dr. Loanne Drilling has left the practice. She will establish with Dr .Gherge/endocrinologist this summer.   Assessment/Plan:   1. Type 2 diabetes mellitus with other specified complication, with long-term current use of insulin (HCC) Continue Mounjaro 7.5 mg once weekly. Decrease Toujeo from 18 units to 16 units. Continue to closely monitor fasting and post prandial blood glucose levels. Contact our clinic with any BG <70 or several levels >200.  2. Obesity, Current BMI 35.4 Shelva is currently in the action stage of change. As such, her goal is to continue with weight loss efforts. She has agreed to the Category 2 Plan.   Exercise goals: No exercise has been prescribed at this time.  Behavioral  modification strategies: increasing lean protein intake, decreasing simple carbohydrates, meal planning and cooking strategies, keeping healthy foods in the home, and planning for success.  Samnang has agreed to follow-up with our clinic in 3 weeks. She was informed of the importance of frequent follow-up visits to maximize her success with intensive lifestyle modifications for her multiple health conditions.   Objective:   Blood pressure 126/74, pulse 84, temperature 98.1 F (36.7 C), height 5\' 3"  (1.6 m), weight 199 lb (90.3 kg), SpO2 96 %. Body mass index is 35.25 kg/m.  General: Cooperative, alert, well developed, in no acute distress. HEENT: Conjunctivae and lids unremarkable. Cardiovascular: Regular rhythm.  Lungs: Normal work of breathing. Neurologic: No focal deficits.   Lab Results  Component Value Date   CREATININE 1.09 (H) 11/13/2021   BUN 25 (H) 11/13/2021   NA 137 11/13/2021   K 3.9 11/13/2021   CL 100 11/13/2021   CO2 27 11/13/2021   Lab Results  Component Value Date   ALT 25 04/21/2020   AST 25 04/21/2020   ALKPHOS 104 04/21/2020   BILITOT 0.5 04/21/2020   Lab Results  Component Value Date   HGBA1C 5.8 (A) 10/14/2021   HGBA1C 6.1 (A) 07/12/2021   HGBA1C 5.8 (A) 04/12/2021   HGBA1C 7.0 (A) 12/10/2020   HGBA1C 6.3 (A) 09/02/2020   No results found for: INSULIN Lab Results  Component Value Date   TSH 1.71 05/30/2018   Lab Results  Component Value Date   CHOL 134 12/16/2020   HDL 41 12/16/2020   LDLCALC 68 12/16/2020   TRIG 142 12/16/2020   CHOLHDL 3.3 12/16/2020   Lab Results  Component Value  Date   VD25OH 63.9 04/21/2020   Lab Results  Component Value Date   WBC 7.5 11/13/2021   HGB 14.0 11/13/2021   HCT 41.3 11/13/2021   MCV 93.0 11/13/2021   PLT 243 11/13/2021   No results found for: IRON, TIBC, FERRITIN  Obesity Behavioral Intervention:   Approximately 15 minutes were spent on the discussion below.  ASK: We discussed the diagnosis  of obesity with Aleita today and Dawnyel agreed to give [place] permission to discuss obesity behavioral modification therapy today.  ASSESS: Lisania has the diagnosis of obesity and her BMI today is 35.4. Nimo is in the action stage of change.   ADVISE: Calii was educated on the multiple health risks of obesity as well as the benefit of weight loss to improve her health. She was advised of the need for long term treatment and the importance of lifestyle modifications to improve her current health and to decrease her risk of future health problems.  AGREE: Multiple dietary modification options and treatment options were discussed and Yevonne agreed to follow the recommendations documented in the above note.  ARRANGE: Mariely was educated on the importance of frequent visits to treat obesity as outlined per CMS and USPSTF guidelines and agreed to schedule her next follow up appointment today.  Attestation Statements:   Reviewed by clinician on day of visit: allergies, medications, problem list, medical history, surgical history, family history, social history, and previous encounter notes.  I, Georgianne Fick, FNP, am acting as Location manager for [name], NP.  I have reviewed the above documentation for accuracy and completeness, and I agree with the above. -  Alhaji Mcneal d. [place] Oborn, NP-C

## 2022-01-17 ENCOUNTER — Encounter (INDEPENDENT_AMBULATORY_CARE_PROVIDER_SITE_OTHER): Payer: Self-pay | Admitting: Family Medicine

## 2022-01-17 ENCOUNTER — Ambulatory Visit (INDEPENDENT_AMBULATORY_CARE_PROVIDER_SITE_OTHER): Payer: Medicare PPO | Admitting: Family Medicine

## 2022-01-17 VITALS — BP 129/76 | HR 75 | Temp 97.8°F | Ht 63.0 in | Wt 207.0 lb

## 2022-01-17 DIAGNOSIS — E669 Obesity, unspecified: Secondary | ICD-10-CM | POA: Diagnosis not present

## 2022-01-17 DIAGNOSIS — Z7985 Long-term (current) use of injectable non-insulin antidiabetic drugs: Secondary | ICD-10-CM | POA: Diagnosis not present

## 2022-01-17 DIAGNOSIS — Z6836 Body mass index (BMI) 36.0-36.9, adult: Secondary | ICD-10-CM

## 2022-01-17 DIAGNOSIS — E1169 Type 2 diabetes mellitus with other specified complication: Secondary | ICD-10-CM | POA: Diagnosis not present

## 2022-01-17 DIAGNOSIS — Z794 Long term (current) use of insulin: Secondary | ICD-10-CM | POA: Diagnosis not present

## 2022-01-17 MED ORDER — TIRZEPATIDE 7.5 MG/0.5ML ~~LOC~~ SOAJ: 7.5000 mg | SUBCUTANEOUS | 0 refills | Status: DC

## 2022-01-20 ENCOUNTER — Other Ambulatory Visit: Payer: Self-pay

## 2022-01-20 ENCOUNTER — Telehealth (HOSPITAL_BASED_OUTPATIENT_CLINIC_OR_DEPARTMENT_OTHER): Payer: Self-pay | Admitting: Cardiology

## 2022-01-20 DIAGNOSIS — E1122 Type 2 diabetes mellitus with diabetic chronic kidney disease: Secondary | ICD-10-CM

## 2022-01-20 DIAGNOSIS — I2510 Atherosclerotic heart disease of native coronary artery without angina pectoris: Secondary | ICD-10-CM

## 2022-01-20 MED ORDER — NITROGLYCERIN 0.4 MG SL SUBL: 0.4000 mg | SUBLINGUAL_TABLET | SUBLINGUAL | 4 refills | Status: DC | PRN

## 2022-01-20 MED ORDER — ACCU-CHEK GUIDE VI STRP: ORAL_STRIP | 12 refills | Status: AC

## 2022-01-20 NOTE — Telephone Encounter (Signed)
*  STAT* If patient is at the pharmacy, call can be transferred to refill team.   1. Which medications need to be refilled? (please list name of each medication and dose if known)  nitroGLYCERIN (NITROSTAT) 0.4 MG SL tablet (Expired)   2. Which pharmacy/location (including street and city if local pharmacy) is medication to be sent to? [id number] - [place], [place] - [address]  3. Do they need a 30 day or 90 day supply? 90 with refills

## 2022-01-25 ENCOUNTER — Ambulatory Visit
Admission: RE | Admit: 2022-01-25 | Discharge: 2022-01-25 | Disposition: A | Discharge status: Home / Self Care | Payer: Medicare PPO | Source: Ambulatory Visit | Attending: Family Medicine | Admitting: Family Medicine

## 2022-01-25 DIAGNOSIS — M8589 Other specified disorders of bone density and structure, multiple sites: Secondary | ICD-10-CM | POA: Diagnosis not present

## 2022-01-25 DIAGNOSIS — Z780 Asymptomatic menopausal state: Secondary | ICD-10-CM | POA: Diagnosis not present

## 2022-02-07 ENCOUNTER — Encounter (INDEPENDENT_AMBULATORY_CARE_PROVIDER_SITE_OTHER): Payer: Self-pay | Admitting: Adult Health

## 2022-02-07 ENCOUNTER — Ambulatory Visit (INDEPENDENT_AMBULATORY_CARE_PROVIDER_SITE_OTHER): Payer: Medicare PPO | Admitting: Adult Health

## 2022-02-07 VITALS — BP 117/73 | HR 77 | Temp 97.7°F | Ht 63.0 in | Wt 201.0 lb

## 2022-02-07 DIAGNOSIS — Z794 Long term (current) use of insulin: Secondary | ICD-10-CM

## 2022-02-07 DIAGNOSIS — Z6835 Body mass index (BMI) 35.0-35.9, adult: Secondary | ICD-10-CM | POA: Diagnosis not present

## 2022-02-07 DIAGNOSIS — Z7985 Long-term (current) use of injectable non-insulin antidiabetic drugs: Secondary | ICD-10-CM | POA: Diagnosis not present

## 2022-02-07 DIAGNOSIS — E1169 Type 2 diabetes mellitus with other specified complication: Secondary | ICD-10-CM | POA: Diagnosis not present

## 2022-02-07 DIAGNOSIS — E669 Obesity, unspecified: Secondary | ICD-10-CM

## 2022-02-07 MED ORDER — TIRZEPATIDE 7.5 MG/0.5ML ~~LOC~~ SOAJ: 7.5000 mg | SUBCUTANEOUS | 0 refills | Status: DC

## 2022-02-07 NOTE — Progress Notes (Unsigned)
Chief Complaint:   OBESITY Pamela Love is here to discuss her progress with her obesity treatment plan along with follow-up of her obesity related diagnoses. Pamela Love is on the Category 2 Plan with breakfast and lunch options and states she is following her eating plan approximately 60% of the time. Pamela Love states she is doing cardio, weights, dance, and water aerobics for 60 minutes 3-4 times per week.  Today's visit was #: 41 Starting weight: 227 lbs Starting date: 03/17/2020 Today's weight: 201 lbs Today's date: 02/07/2022 Total lbs lost to date: 26 Total lbs lost since last in-office visit: 6  Interim History: Pamela Love has only had 1 blood glucose<100 since her last office visit.  She remained on Toujeo 16 units daily and Mounjaro 7.5 mg once weekly.  She has an appointment with Dr.Gherghe, Endocrinologist on 02/25/2022, and hopes to decrease or discontinue Toujeo.  Subjective:   1. Type 2 diabetes mellitus with other specified complication, with long-term current use of insulin (HCC) Pamela Love is currently on Toujeo 16 units with only once blood glucose of <100 since her last office visit. She remained on Mounjaro 7.5 mg once weekly. Her home glucose numbers are 114, 164, 110, 182 (fasting), and 170 (fasting).***  Assessment/Plan:   1. Type 2 diabetes mellitus with other specified complication, with long-term current use of insulin (HCC) We will refill Mounjaro at 7.5 mg once weekly for 1 month. Pamela Love will continue Toujeo 16 units per Endocrinology.  If consistently***.  - tirzepatide (MOUNJARO) 7.5 MG/0.5ML Pen; Inject 7.5 mg into the skin once a week.  Dispense: 2 mL; Refill: 0  2. Obesity, Current BMI 35.7 Pamela Love is currently in the action stage of change. As such, her goal is to continue with weight loss efforts. She has Love to the Category 2 Plan.   Exercise goals: As is.   Behavioral modification strategies: increasing lean protein intake, decreasing simple carbohydrates, meal  planning and cooking strategies, keeping healthy foods in the home, and planning for success.  Pamela Love has Love to follow-up with our clinic in 2 to 3 weeks. She was informed of the importance of frequent follow-up visits to maximize her success with intensive lifestyle modifications for her multiple health conditions.   Objective:   Blood pressure 117/73, pulse 77, temperature 97.7 F (36.5 C), height 5\' 3"  (1.6 m), weight 201 lb (91.2 kg), SpO2 98 %. Body mass index is 35.61 kg/m.  General: Cooperative, alert, well developed, in no acute distress. HEENT: Conjunctivae and lids unremarkable. Cardiovascular: Regular rhythm.  Lungs: Normal work of breathing. Neurologic: No focal deficits.   Lab Results  Component Value Date   CREATININE 1.09 (H) 11/13/2021   BUN 25 (H) 11/13/2021   NA 137 11/13/2021   K 3.9 11/13/2021   CL 100 11/13/2021   CO2 27 11/13/2021   Lab Results  Component Value Date   ALT 25 04/21/2020   AST 25 04/21/2020   ALKPHOS 104 04/21/2020   BILITOT 0.5 04/21/2020   Lab Results  Component Value Date   HGBA1C 5.8 (A) 10/14/2021   HGBA1C 6.1 (A) 07/12/2021   HGBA1C 5.8 (A) 04/12/2021   HGBA1C 7.0 (A) 12/10/2020   HGBA1C 6.3 (A) 09/02/2020   No results found for: "INSULIN" Lab Results  Component Value Date   TSH 1.71 05/30/2018   Lab Results  Component Value Date   CHOL 134 12/16/2020   HDL 41 12/16/2020   LDLCALC 68 12/16/2020   TRIG 142 12/16/2020   CHOLHDL 3.3 12/16/2020  Lab Results  Component Value Date   VD25OH 63.9 04/21/2020   Lab Results  Component Value Date   WBC 7.5 11/13/2021   HGB 14.0 11/13/2021   HCT 41.3 11/13/2021   MCV 93.0 11/13/2021   PLT 243 11/13/2021   No results found for: "IRON", "TIBC", "FERRITIN"  Obesity Behavioral Intervention:   Approximately 15 minutes were spent on the discussion below.  ASK: We discussed the diagnosis of obesity with Pamela Love today and Pamela Love to give [place] permission to discuss  obesity behavioral modification therapy today.  ASSESS: Pamela Love has the diagnosis of obesity and her BMI today is 35.7. Pamela Love is in the action stage of change.   ADVISE: Pamela Love was educated on the multiple health risks of obesity as well as the benefit of weight loss to improve her health. She was advised of the need for long term treatment and the importance of lifestyle modifications to improve her current health and to decrease her risk of future health problems.  AGREE: Multiple dietary modification options and treatment options were discussed and Pamela Love to follow the recommendations documented in the above note.  ARRANGE: Pamela Love was educated on the importance of frequent visits to treat obesity as outlined per CMS and USPSTF guidelines and Love to schedule her next follow up appointment today.  Attestation Statements:   Reviewed by clinician on day of visit: allergies, medications, problem list, medical history, surgical history, family history, social history, and previous encounter notes.   Trude Mcburney, am acting as transcriptionist for William Hamburger, NP.  I have reviewed the above documentation for accuracy and completeness, and I agree with the above. -  ***

## 2022-02-15 ENCOUNTER — Ambulatory Visit: Payer: Medicare PPO | Admitting: Cardiovascular Disease

## 2022-02-16 DIAGNOSIS — M7918 Myalgia, other site: Secondary | ICD-10-CM | POA: Diagnosis not present

## 2022-02-21 ENCOUNTER — Ambulatory Visit (INDEPENDENT_AMBULATORY_CARE_PROVIDER_SITE_OTHER): Payer: Medicare PPO | Admitting: Family Medicine

## 2022-02-21 ENCOUNTER — Encounter (INDEPENDENT_AMBULATORY_CARE_PROVIDER_SITE_OTHER): Payer: Self-pay | Admitting: Family Medicine

## 2022-02-21 VITALS — BP 119/72 | HR 66 | Temp 98.1°F | Ht 63.0 in | Wt 203.0 lb

## 2022-02-21 DIAGNOSIS — Z7985 Long-term (current) use of injectable non-insulin antidiabetic drugs: Secondary | ICD-10-CM

## 2022-02-21 DIAGNOSIS — Z794 Long term (current) use of insulin: Secondary | ICD-10-CM

## 2022-02-21 DIAGNOSIS — E1159 Type 2 diabetes mellitus with other circulatory complications: Secondary | ICD-10-CM

## 2022-02-21 DIAGNOSIS — E669 Obesity, unspecified: Secondary | ICD-10-CM | POA: Diagnosis not present

## 2022-02-21 DIAGNOSIS — Z6836 Body mass index (BMI) 36.0-36.9, adult: Secondary | ICD-10-CM | POA: Diagnosis not present

## 2022-02-21 MED ORDER — TIRZEPATIDE 10 MG/0.5ML ~~LOC~~ SOAJ: 10.0000 mg | SUBCUTANEOUS | 0 refills | Status: DC

## 2022-02-22 DIAGNOSIS — M79604 Pain in right leg: Secondary | ICD-10-CM | POA: Diagnosis not present

## 2022-02-22 DIAGNOSIS — M62561 Muscle wasting and atrophy, not elsewhere classified, right lower leg: Secondary | ICD-10-CM | POA: Diagnosis not present

## 2022-02-22 DIAGNOSIS — M5459 Other low back pain: Secondary | ICD-10-CM | POA: Diagnosis not present

## 2022-02-22 DIAGNOSIS — M545 Low back pain, unspecified: Secondary | ICD-10-CM | POA: Diagnosis not present

## 2022-02-24 DIAGNOSIS — M79604 Pain in right leg: Secondary | ICD-10-CM | POA: Diagnosis not present

## 2022-02-24 DIAGNOSIS — M62561 Muscle wasting and atrophy, not elsewhere classified, right lower leg: Secondary | ICD-10-CM | POA: Diagnosis not present

## 2022-02-24 DIAGNOSIS — M545 Low back pain, unspecified: Secondary | ICD-10-CM | POA: Diagnosis not present

## 2022-02-24 DIAGNOSIS — M5459 Other low back pain: Secondary | ICD-10-CM | POA: Diagnosis not present

## 2022-02-24 NOTE — Progress Notes (Signed)
Chief Complaint:   OBESITY Pamela Love is here to discuss her progress with her obesity treatment plan along with follow-up of her obesity related diagnoses. Pamela Love is on the Category 2 Plan and states she is following her eating plan approximately 60% of the time. Pamela Love states she is doing cardio and water aerobics 120 minutes 1 times per week.  Today's visit was #: 42 Starting weight: 227 lbs Starting date: 03/17/2020 Today's weight: 203 lbs Today's date: 02/17/2022 Total lbs lost to date: 24 Total lbs lost since last in-office visit: +2  Interim History: Pamela Love's grandkids were staying with her recently, therefore she did more snacking on simple carbs (cookies, cereal, ice cream). She is still on 16 units of Toujeo.  Subjective:   1. Type 2 diabetes mellitus with other circulatory complication, with long-term current use of insulin (HCC) Pt's blood sugars have been running a little high lately due to poor eating habits with grandkids. She tolerates medication well but still feels hungry and has cravings.  Assessment/Plan:  No orders of the defined types were placed in this encounter.   Medications Discontinued During This Encounter  Medication Reason   tirzepatide (MOUNJARO) 7.5 MG/0.5ML Pen Dose change     Meds ordered this encounter  Medications   tirzepatide (MOUNJARO) 10 MG/0.5ML Pen    Sig: Inject 10 mg into the skin once a week. Every thursday    Dispense:  2 mL    Refill:  0     1. Type 2 diabetes mellitus with other circulatory complication, with long-term current use of insulin (HCC) Good blood sugar control is important to decrease the likelihood of diabetic complications such as nephropathy, neuropathy, limb loss, blindness, coronary artery disease, and death. Intensive lifestyle modification including diet, exercise and weight loss are the first line of treatment for diabetes. Increase Mounjaro to 10 mg. Ween Toujeo down 2 units, if FBS is <120  regularly.  Increase & Refill- tirzepatide (MOUNJARO) 10 MG/0.5ML Pen; Inject 10 mg into the skin once a week. Every thursday  Dispense: 2 mL; Refill: 0  2. Obesity, Current BMI 36.1 Pamela Love is currently in the action stage of change. As such, her goal is to continue with weight loss efforts. She has agreed to the Category 2 Plan.   Exercise goals:  As is  Behavioral modification strategies: increasing lean protein intake, decreasing simple carbohydrates, and planning for success.  Pamela Love has agreed to follow-up with our clinic in 2 weeks with NP [facility]. She was informed of the importance of frequent follow-up visits to maximize her success with intensive lifestyle modifications for her multiple health conditions.   Objective:   Blood pressure 119/72, pulse 66, temperature 98.1 F (36.7 C), height 5\' 3"  (1.6 m), weight 203 lb (92.1 kg), SpO2 96 %. Body mass index is 35.96 kg/m.  General: Cooperative, alert, well developed, in no acute distress. HEENT: Conjunctivae and lids unremarkable. Cardiovascular: Regular rhythm.  Lungs: Normal work of breathing. Neurologic: No focal deficits.   Lab Results  Component Value Date   CREATININE 1.09 (H) 11/13/2021   BUN 25 (H) 11/13/2021   NA 137 11/13/2021   K 3.9 11/13/2021   CL 100 11/13/2021   CO2 27 11/13/2021   Lab Results  Component Value Date   ALT 25 04/21/2020   AST 25 04/21/2020   ALKPHOS 104 04/21/2020   BILITOT 0.5 04/21/2020   Lab Results  Component Value Date   HGBA1C 5.8 (A) 10/14/2021   HGBA1C 6.1 (A)  07/12/2021   HGBA1C 5.8 (A) 04/12/2021   HGBA1C 7.0 (A) 12/10/2020   HGBA1C 6.3 (A) 09/02/2020   No results found for: "INSULIN" Lab Results  Component Value Date   TSH 1.71 05/30/2018   Lab Results  Component Value Date   CHOL 134 12/16/2020   HDL 41 12/16/2020   LDLCALC 68 12/16/2020   TRIG 142 12/16/2020   CHOLHDL 3.3 12/16/2020   Lab Results  Component Value Date   VD25OH 63.9 04/21/2020   Lab Results   Component Value Date   WBC 7.5 11/13/2021   HGB 14.0 11/13/2021   HCT 41.3 11/13/2021   MCV 93.0 11/13/2021   PLT 243 11/13/2021   No results found for: "IRON", "TIBC", "FERRITIN"  Obesity Behavioral Intervention:   Approximately 15 minutes were spent on the discussion below.  ASK: We discussed the diagnosis of obesity with [name] today and Pamela Love agreed to give [place] permission to discuss obesity behavioral modification therapy today.  ASSESS: Pamela Love has the diagnosis of obesity and her BMI today is 36.1. Pamela Love is in the action stage of change.   ADVISE: Pamela Love was educated on the multiple health risks of obesity as well as the benefit of weight loss to improve her health. She was advised of the need for long term treatment and the importance of lifestyle modifications to improve her current health and to decrease her risk of future health problems.  AGREE: Multiple dietary modification options and treatment options were discussed and Pamela Love agreed to follow the recommendations documented in the above note.  ARRANGE: Pamela Love was educated on the importance of frequent visits to treat obesity as outlined per CMS and USPSTF guidelines and agreed to schedule her next follow up appointment today.  Attestation Statements:   Reviewed by clinician on day of visit: allergies, medications, problem list, medical history, surgical history, family history, social history, and previous encounter notes.  I, Kyung Rudd, BS, CMA, am acting as transcriptionist for [employer], DO.  I have reviewed the above documentation for accuracy and completeness, and I agree with the above. [name], D.O.  The 21st Century Cures Act was signed into law in 2016 which includes the topic of electronic health records.  This provides immediate access to information in MyChart.  This includes consultation notes, operative notes, office notes, lab results and pathology reports.  If you have any questions  about what you read please let us know at your next visit so we can discuss your concerns and take corrective action if need be.  We are right here with you.

## 2022-02-25 ENCOUNTER — Ambulatory Visit: Payer: Medicare PPO | Admitting: Internal Medicine

## 2022-02-25 ENCOUNTER — Encounter: Payer: Self-pay | Admitting: Internal Medicine

## 2022-02-25 VITALS — BP 130/84 | HR 86 | Ht 63.0 in | Wt 204.2 lb

## 2022-02-25 DIAGNOSIS — N1831 Chronic kidney disease, stage 3a: Secondary | ICD-10-CM

## 2022-02-25 DIAGNOSIS — E1159 Type 2 diabetes mellitus with other circulatory complications: Secondary | ICD-10-CM | POA: Diagnosis not present

## 2022-02-25 DIAGNOSIS — E1165 Type 2 diabetes mellitus with hyperglycemia: Secondary | ICD-10-CM

## 2022-02-25 DIAGNOSIS — E782 Mixed hyperlipidemia: Secondary | ICD-10-CM | POA: Diagnosis not present

## 2022-02-25 DIAGNOSIS — E1122 Type 2 diabetes mellitus with diabetic chronic kidney disease: Secondary | ICD-10-CM | POA: Diagnosis not present

## 2022-02-25 LAB — POCT GLYCOSYLATED HEMOGLOBIN (HGB A1C): Hemoglobin A1C: 6.1 % — AB (ref 4.0–5.6)

## 2022-02-25 NOTE — Progress Notes (Signed)
Patient ID: Pamela Love, female   DOB: Jun 13, 1951, 71 y.o.   MRN: [id number]  HPI: Pamela Love is a 71 y.o.-year-old female, returning for follow-up for DM2, dx in 2012, insulin-dependent, uncontrolled, with complications (CAD - s/p 2 stents, stage 3 CKD). Pt. previously saw Dr. Loanne Drilling, last visit 4 months ago.  Reviewed HbA1c: Lab Results  Component Value Date   HGBA1C 5.8 (A) 10/14/2021   HGBA1C 6.1 (A) 07/12/2021   HGBA1C 5.8 (A) 04/12/2021   HGBA1C 7.0 (A) 12/10/2020   HGBA1C 6.3 (A) 09/02/2020   HGBA1C 6.5 (A) 05/27/2020   HGBA1C 7.9 (H) 04/21/2020   HGBA1C 8.0 (A) 02/20/2020   HGBA1C 7.7 (A) 10/07/2019   HGBA1C 7.3 (A) 05/14/2019   Pt is on a regimen of: - Toujeo 16 units daily - Mounjaro 7.5 units every 5 days - [facility] She previously tried Victoza and Ozempic. She tried Trulicity and Ozempic >> nausea She tried [place] >> vaginal yeast infections She tried repaglinide >> diarrhea Reviewing Dr. Cordelia Pen last note, she declined multiple daily injections in the past.  Pt checks her sugars 1x a day and they are: - am: 152-170 - 2h after b'fast: n/c - before lunch: 110 - 2h after lunch: n/c - before dinner: 126 - 2h after dinner: n/c - bedtime: 87-114 - nighttime: n/c Lowest sugar was 87; she has hypoglycemia awareness at 70.  Highest sugar was 180.  Glucometer:Accu Chek  - + CKD, last BUN/creatinine:  Lab Results  Component Value Date   BUN 25 (H) 11/13/2021   BUN 28 (H) 01/26/2021   CREATININE 1.09 (H) 11/13/2021   CREATININE 1.02 (H) 01/26/2021  She is not on ACE inhibitor/ARB.  -+ HL; last set of lipids: 07/07/2021: 158/143/57/77 Lab Results  Component Value Date   CHOL 134 12/16/2020   HDL 41 12/16/2020   LDLCALC 68 12/16/2020   TRIG 142 12/16/2020   CHOLHDL 3.3 12/16/2020  She is on Crestor 40 mg daily.  - last eye exam was in 08/2021. No DR reportedly. Dr. Prudencio Burly.  - no numbness and  tingling in her feet.  Last foot exam 04/12/2021.  She also has a history of GERD, OSA, OA, osteoporosis, obesity.  She lost 28-30 lbs since 2021 - [facility].  She adapted well to the diet, despite the fact that she describes having a sweet tooth.  ROS: + see HPI No increased urination, blurry vision, nausea, + chest pain after eating- 2 episodes last month.  Past Medical History:  Diagnosis Date   Abnormal nuclear stress test 01/02/2020   Arthritis    Bilateral leg edema 01/02/2020   Bursitis    hips   C. difficile colitis 12/2017   CAD (coronary artery disease) 01/06/2020   CKD (chronic kidney disease), stage III (HCC)    Complication of anesthesia    "spinal did not work with second c section"   Diabetes mellitus ([place]) 11/19/2015   Diabetes mellitus, type 2 ([place])    diet controlled - has Rx for Glymipride but has not started taking yet   Dyslipidemia 10/21/2014   GERD (gastroesophageal reflux disease)    History of nonmelanoma skin cancer    History of skin cancer    Hypercholesterolemia 12/11/2019   Hyperlipidemia    Hypertension    Hypertensive disorder 10/21/2014   Lower extremity edema    Mixed hyperlipidemia 01/02/2020   OAB (overactive bladder)    Obesity (BMI 30-39.9) 05/12/2020  OSA (obstructive sleep apnea) 05/12/2020   Osteoarthritis of right knee 05/15/2015   Osteoarthrosis, unspecified whether generalized or localized, involving lower leg 10/21/2014   Osteoporosis    Over weight    Primary osteoarthritis of left knee 12/18/2014   S/P knee replacement 12/18/2014   S/P total knee replacement using cement 05/15/2015   Sleep apnea    Tachycardia 12/11/2019   Past Surgical History:  Procedure Laterality Date   ABDOMINAL HYSTERECTOMY  2005   CATARACT EXTRACTION     CESAREAN SECTION     x2   colonscopy      CORONARY STENT INTERVENTION N/A 01/06/2020   Procedure: CORONARY STENT INTERVENTION;  Surgeon: Lorretta Harp, MD;  Location: [facility];  Service: Cardiovascular;  Laterality: N/A;   EYE SURGERY     bilat cataract surgery 2015   INTRAVASCULAR PRESSURE WIRE/FFR STUDY N/A 01/06/2020   Procedure: INTRAVASCULAR PRESSURE WIRE/FFR STUDY;  Surgeon: Lorretta Harp, MD;  Location: [facility];  Service: Cardiovascular;  Laterality: N/A;   LEFT HEART CATH AND CORONARY ANGIOGRAPHY N/A 01/06/2020   Procedure: LEFT HEART CATH AND CORONARY ANGIOGRAPHY;  Surgeon: Lorretta Harp, MD;  Location: [facility];  Service: Cardiovascular;  Laterality: N/A;   LEFT HEART CATH AND CORONARY ANGIOGRAPHY N/A 01/29/2021   Procedure: LEFT HEART CATH AND CORONARY ANGIOGRAPHY;  Surgeon: Jettie Booze, MD;  Location: [facility];  Service: Cardiovascular;  Laterality: N/A;   TOTAL KNEE ARTHROPLASTY Left 12/18/2014   Procedure: TOTAL LEFT KNEE ARTHROPLASTY;  Surgeon: Sydnee Cabal, MD;  Location: [facility];  Service: Orthopedics;  Laterality: Left;   TOTAL KNEE ARTHROPLASTY Right 05/15/2015   Procedure: RIGHT TOTAL KNEE ARTHROPLASTY;  Surgeon: Sydnee Cabal, MD;  Location: [facility];  Service: Orthopedics;  Laterality: Right;   UTERINE FIBROID SURGERY     Social History   Socioeconomic History   Marital status: Married    Spouse name: Not on file   Number of children: Not on file   Years of education: Not on file   Highest education level: Not on file  Occupational History   Occupation: retired  Tobacco Use   Smoking status: Never   Smokeless tobacco: Never  Vaping Use   Vaping Use: Never used  Substance and Sexual Activity   Alcohol use: Yes    Comment: rare   Drug use: No   Sexual activity: Not on file  Other Topics Concern   Not on file  Social History Narrative   Not on file   Social Determinants of Health   Financial Resource Strain: Not on file  Food Insecurity: Not on file  Transportation Needs: Not on file  Physical Activity: Not on file  Stress: Not on file  Social Connections: Not on file   Intimate Partner Violence: Not on file   Current Outpatient Medications on File Prior to Visit  Medication Sig Dispense Refill   Accu-Chek Softclix Lancets lancets USE TO CHECK BLOOD SUGAR TWICE DAILY 200 each 3   acetaminophen (TYLENOL) 650 MG CR tablet Take 1,300 mg by mouth at bedtime.     alum & mag hydroxide-simeth (MAALOX MAX) 400-400-40 MG/5ML suspension Take 15 mLs by mouth every 6 (six) hours as needed for indigestion. 355 mL 0   aspirin EC 81 MG tablet Take 1 tablet (81 mg total) by mouth in the morning.     Blood Glucose Monitoring Suppl (ACCU-CHEK GUIDE ME) w/Device KIT 1 each by Does not apply route  2 (two) times daily. E11.9 1 kit 0   carvedilol (COREG) 3.125 MG tablet Take 1 tablet (3.125 mg total) by mouth 2 (two) times daily with a meal. 180 tablet 3   Cholecalciferol (VITAMIN D) 2000 UNITS CAPS Take 2,000 Units by mouth every morning.      clopidogrel (PLAVIX) 75 MG tablet Take 1 tablet (75 mg total) by mouth daily with breakfast. 90 tablet 3   Coenzyme Q10 (COQ10) 200 MG CAPS Take 200 mg by mouth in the morning.     DULoxetine (CYMBALTA) 20 MG capsule Take 40 mg by mouth at bedtime.      furosemide (LASIX) 40 MG tablet Take 1 tablet (40 mg total) by mouth 3 (three) times a week. Tuesdays, Thursdays & Saturdays in the morning 30 tablet 6   glucose blood (ACCU-CHEK GUIDE) test strip Use to check BS 2x a day 200 each 12   insulin glargine, 2 Unit Dial, (TOUJEO MAX SOLOSTAR) 300 UNIT/ML Solostar Pen Inject 18 Units into the skin in the morning. And 4 mm pen needles 2/day (Patient taking differently: Inject 16 Units into the skin in the morning. And 4 mm pen needles 2/day) 6 mL 3   Insulin Pen Needle (BD PEN NEEDLE NANO U/F) 32G X 4 MM MISC 1 each by Does not apply route daily. 100 each 3   lidocaine (XYLOCAINE) 2 % solution Use as directed 15 mLs in the mouth or throat as needed for mouth pain. 100 mL 0   Multiple Vitamin (MULTIVITAMIN WITH MINERALS) TABS tablet Take 1 tablet by  mouth every morning.      nitroGLYCERIN (NITROSTAT) 0.4 MG SL tablet Place 1 tablet (0.4 mg total) under the tongue every 5 (five) minutes x 3 doses as needed for chest pain. 25 tablet 4   Omega-3 Fatty Acids (RA FISH OIL) 1400 MG CPDR Take 1,400 mg by mouth 2 (two) times daily.     pantoprazole (PROTONIX) 40 MG tablet Take 40 mg by mouth daily.     potassium chloride SA (KLOR-CON M20) 20 MEQ tablet Take 1 tablet (20 mEq total) by mouth 3 (three) times a week. Tuesdays, Thursdays & Saturdays in the morning 30 tablet 6   ranolazine (RANEXA) 1000 MG SR tablet Take 1 tablet (1,000 mg total) by mouth 2 (two) times daily. 180 tablet 3   rosuvastatin (CRESTOR) 40 MG tablet Take 1 tablet (40 mg total) by mouth in the morning. 90 tablet 3   saccharomyces boulardii (FLORASTOR) 250 MG capsule Take 250 mg by mouth in the morning.     tirzepatide (MOUNJARO) 10 MG/0.5ML Pen Inject 10 mg into the skin once a week. Every thursday 2 mL 0   trospium (SANCTURA) 20 MG tablet Take 20 mg by mouth 2 (two) times daily.  3   vitamin C (ASCORBIC ACID) 250 MG tablet Take 250 mg by mouth in the morning.     No current facility-administered medications on file prior to visit.   Allergies  Allergen Reactions   Amoxicillin-Pot Clavulanate     Other reaction(s): vomiting   Aspirin     Other reaction(s): reflux and nervousness   Colchicine     Stomach cramps Other reaction(s): severe GI upset   Diclofenac     Oral causes stomach cramps   Dulaglutide Diarrhea and Nausea And Vomiting    Other reaction(s): diarrhea   Empagliflozin     Other reaction(s): yeast infection   Ezetimibe     Muscle pain Other reaction(s): muscle  aches   Imdur [Isosorbide Nitrate] Other (See Comments)    hypotensive   Imipramine Hcl     Other reaction(s): rash   Isosorbide Other (See Comments)    Causes Hypotension   Metformin     Other reaction(s): stomach upset   Repaglinide     Other reaction(s): swelling and GI upset    Sitagliptin-Metformin Hcl Nausea And Vomiting    Lightheaded  Other reaction(s): GI upset   Sulfamethoxazole-Trimethoprim Itching    Other reaction(s): itching   Imipramine Rash   Tape Rash    Paper tape ok to use    Family History  Problem Relation Age of Onset   Diabetes Maternal Grandfather    Heart attack Maternal Grandfather    Heart disease Mother    Hypertension Mother    Hyperlipidemia Mother    Cancer Mother    Heart attack Father    Hypertension Father    Hyperlipidemia Father    Heart disease Father    Sudden death Father    Sleep apnea Father    Heart attack Sister    Heart attack Paternal Uncle    Congenital heart disease Son    PE: BP 130/84 (BP Location: Left Arm, Patient Position: Sitting, Cuff Size: Normal)   Pulse 86   Ht '5\' 3"'  (1.6 m)   Wt 204 lb 3.2 oz (92.6 kg)   SpO2 97%   BMI 36.17 kg/m  Wt Readings from Last 3 Encounters:  02/25/22 204 lb 3.2 oz (92.6 kg)  02/21/22 203 lb (92.1 kg)  02/07/22 201 lb (91.2 kg)   Constitutional: overweight, in NAD Eyes: no exophthalmos ENT: moist mucous membranes, no thyromegaly, no cervical lymphadenopathy Cardiovascular: RRR, No MRG Respiratory: CTA B Musculoskeletal: no deformities Skin: moist, warm, no rashes Neurological: no tremor with outstretched hands  ASSESSMENT: 1. DM2, non-insulin-dependent, uncontrolled, without long-term complications - CAD - s/p 2 stents - stage 3 CKD  2. HL  3.  Obesity class II  PLAN:  1. Patient with long-standing, uncontrolled diabetes, on oral antidiabetic regimen, with good control.  Latest HbA1c obtained 4 months ago was at goal, at 5.8%.  At today's visit, we checked another HbA1c and this was 6.1% (higher). - She continues on long-acting insulin, dose decreased at last visit with Dr. Loanne Drilling and also on weekly GLP-1/GIP receptor agonist.  She mentions that [facility] is titrated by the weight management clinic (Dr. Raliegh Scarlet).  She was advised to increase to 10 mg  weekly, however, she still had 7.5 mg pens at home and she is preparing to switch to 10 mg when she runs out of these.  This change will be in approximately 1 month.  She tolerates the [facility] well.  She is trying to decrease the insulin dose.  As of now, she is on 16 units daily. -Reviewing her blood sugars at home, they are higher than target in the morning, between 150s and 170, and they are mostly at goal later in the day.  For now, my suggestion was to increase the Toujeo by 2 units but to back off the dose again when she is able to titrate Mounjaro up. - I suggested to:  Patient Instructions  Please increase: - Toujeo 18 units daily (after you increase Mounjaro, you may be able to decrease the dose back to 16 units daily)  Increase: - Mounjaro to 10 mg weekly  - as per Dr. Raliegh Scarlet  Please return in 6 months with your sugar log.   - check  sugars at different times of the day - check 1-2x a day, rotating checks - discussed about CBG targets for treatment: 80-130 mg/dL before meals and <180 mg/dL after meals; target HbA1c <7%. - given sugar log and advised how to fill it and to bring it at next appt  - given foot care handout  - given instructions for hypoglycemia management "15-15 rule"  - advised for yearly eye exams - she is UTD - she will need a foot exam at next visit - Return to clinic in 6 mo with sugar log   2. HL - Reviewed latest lipid panel from 07/2021: LDL above our target of less than 55, the rest the fractions at goal - Continues Crestor 40 mg daily without side effects.  3.  Obesity class II -Continues on Mounjaro, titrated up, next increase in 4 weeks -She tolerates Mounjaro well -She mentions she lost between 28 and 30 units in the last 2 years while working with a weight management clinic. -She gained 2 pounds since last visit with Dr. Loanne Drilling  - Total time spent for the visit: 40 min, in precharting, reviewing Dr. Cordelia Pen last note, obtaining medical  information from the chart and from the pt, reviewing her  previous labs, evaluations, and treatments, reviewing her symptoms, counseling her about her diabetes (please see the discussed topics above), and developing a plan to further treat it; she had a number of questions which I addressed   Pamela Kingdom, MD PhD [facility] Endocrin[facility]yl

## 2022-02-25 NOTE — Addendum Note (Signed)
Addended by: Eden Lathe on: 02/25/2022 01:06 PM   Modules accepted: Orders

## 2022-02-25 NOTE — Patient Instructions (Signed)
Please increase: - Toujeo 18 units daily (after you increase Mounjaro, you may be able to decrease the dose back to 16 units daily)  Increase: - Mounjaro to 10 mg weekly  - as per Dr. Sharee Holster  Please return in 6 months with your sugar log.   PATIENT INSTRUCTIONS FOR TYPE 2 DIABETES:  DIET AND EXERCISE Diet and exercise is an important part of diabetic treatment.  We recommended aerobic exercise in the form of brisk walking (working between 40-60% of maximal aerobic capacity, similar to brisk walking) for 150 minutes per week (such as 30 minutes five days per week) along with 3 times per week performing 'resistance' training (using various gauge rubber tubes with handles) 5-10 exercises involving the major muscle groups (upper body, lower body and core) performing 10-15 repetitions (or near fatigue) each exercise. Start at half the above goal but build slowly to reach the above goals. If limited by weight, joint pain, or disability, we recommend daily walking in a swimming pool with water up to waist to reduce pressure from joints while allow for adequate exercise.    BLOOD GLUCOSES Monitoring your blood glucoses is important for continued management of your diabetes. Please check your blood glucoses 2-4 times a day: fasting, before meals and at bedtime (you can rotate these measurements - e.g. one day check before the 3 meals, the next day check before 2 of the meals and before bedtime, etc.).   HYPOGLYCEMIA (low blood sugar) Hypoglycemia is usually a reaction to not eating, exercising, or taking too much insulin/ other diabetes drugs.  Symptoms include tremors, sweating, hunger, confusion, headache, etc. Treat IMMEDIATELY with 15 grams of Carbs: 4 glucose tablets  cup regular juice/soda 2 tablespoons raisins 4 teaspoons sugar 1 tablespoon honey Recheck blood glucose in 15 mins and repeat above if still symptomatic/blood glucose <100.  RECOMMENDATIONS TO REDUCE YOUR RISK OF DIABETIC  COMPLICATIONS: * Take your prescribed MEDICATION(S) * Follow a DIABETIC diet: Complex carbs, fiber rich foods, (monounsaturated and polyunsaturated) fats * AVOID saturated/trans fats, high fat foods, >2,300 mg salt per day. * EXERCISE at least 5 times a week for 30 minutes or preferably daily.  * DO NOT SMOKE OR DRINK more than 1 drink a day. * Check your FEET every day. Do not wear tightfitting shoes. Contact us if you develop an ulcer * See your EYE doctor once a year or more if needed * Get a FLU shot once a year * Get a PNEUMONIA vaccine once before and once after age 83 years  GOALS:  * Your Hemoglobin A1c of <7%  * fasting sugars need to be <130 * after meals sugars need to be <180 (2h after you start eating) * Your Systolic BP should be 140 or lower  * Your Diastolic BP should be 80 or lower  * Your HDL (Good Cholesterol) should be 40 or higher  * Your LDL (Bad Cholesterol) should be 100 or lower. * Your Triglycerides should be 150 or lower  * Your Urine microalbumin (kidney function) should be <30 * Your Body Mass Index should be 25 or lower    Please consider the following ways to cut down carbs and fat and increase fiber and micronutrients in your diet: - substitute whole grain for white bread or pasta - substitute brown rice for white rice - substitute 90-calorie flat bread pieces for slices of bread when possible - substitute sweet potatoes or yams for white potatoes - substitute humus for margarine - substitute tofu  for cheese when possible - substitute almond or rice milk for regular milk (would not drink soy milk daily due to concern for soy estrogen influence on breast cancer risk) - substitute dark chocolate for other sweets when possible - substitute water - can add lemon or orange slices for taste - for diet sodas (artificial sweeteners will trick your body that you can eat sweets without getting calories and will lead you to overeating and weight gain in the long  run) - do not skip breakfast or other meals (this will slow down the metabolism and will result in more weight gain over time)  - can try smoothies made from fruit and almond/rice milk in am instead of regular breakfast - can also try old-fashioned (not instant) oatmeal made with almond/rice milk in am - order the dressing on the side when eating salad at a restaurant (pour less than half of the dressing on the salad) - eat as little meat as possible - can try juicing, but should not forget that juicing will get rid of the fiber, so would alternate with eating raw veg./fruits or drinking smoothies - use as little oil as possible, even when using olive oil - can dress a salad with a mix of balsamic vinegar and lemon juice, for e.g. - use agave nectar, stevia sugar, or regular sugar rather than artificial sweateners - steam or broil/roast veggies  - snack on veggies/fruit/nuts (unsalted, preferably) when possible, rather than processed foods - reduce or eliminate aspartame in diet (it is in diet sodas, chewing gum, etc) Read the labels!  Try to read Dr. Janene Harvey book: "Program for Reversing Diabetes" for other ideas for healthy eating.

## 2022-02-28 DIAGNOSIS — M79604 Pain in right leg: Secondary | ICD-10-CM | POA: Diagnosis not present

## 2022-02-28 DIAGNOSIS — M5459 Other low back pain: Secondary | ICD-10-CM | POA: Diagnosis not present

## 2022-02-28 DIAGNOSIS — M6256 Muscle wasting and atrophy, not elsewhere classified, lower leg: Secondary | ICD-10-CM | POA: Diagnosis not present

## 2022-03-03 DIAGNOSIS — M62561 Muscle wasting and atrophy, not elsewhere classified, right lower leg: Secondary | ICD-10-CM | POA: Diagnosis not present

## 2022-03-03 DIAGNOSIS — M79604 Pain in right leg: Secondary | ICD-10-CM | POA: Diagnosis not present

## 2022-03-03 DIAGNOSIS — M545 Low back pain, unspecified: Secondary | ICD-10-CM | POA: Diagnosis not present

## 2022-03-03 NOTE — Progress Notes (Signed)
  Cardiology Clinic Note   Patient Name: Pamela Love Date of Encounter: 03/03/2022  Primary Care Provider:  Lupita Raider, MD Primary Cardiologist:  Jodelle Red, MD  Patient Profile     Patient scheduled in error.  Rescheduled with appropriate provider Dr. Tresa Endo for CPAP evaluation.  No charge for this patient encounter.

## 2022-03-04 ENCOUNTER — Encounter (INDEPENDENT_AMBULATORY_CARE_PROVIDER_SITE_OTHER): Payer: Medicare PPO | Admitting: Adult Health

## 2022-03-04 ENCOUNTER — Encounter: Payer: Self-pay | Admitting: Adult Health

## 2022-03-07 DIAGNOSIS — M62561 Muscle wasting and atrophy, not elsewhere classified, right lower leg: Secondary | ICD-10-CM | POA: Diagnosis not present

## 2022-03-07 DIAGNOSIS — M545 Low back pain, unspecified: Secondary | ICD-10-CM | POA: Diagnosis not present

## 2022-03-07 DIAGNOSIS — M79604 Pain in right leg: Secondary | ICD-10-CM | POA: Diagnosis not present

## 2022-03-08 ENCOUNTER — Encounter (INDEPENDENT_AMBULATORY_CARE_PROVIDER_SITE_OTHER): Payer: Self-pay | Admitting: Adult Health

## 2022-03-08 ENCOUNTER — Ambulatory Visit (INDEPENDENT_AMBULATORY_CARE_PROVIDER_SITE_OTHER): Payer: Medicare PPO | Admitting: Adult Health

## 2022-03-08 VITALS — BP 122/74 | HR 82 | Temp 97.2°F | Ht 63.0 in | Wt 201.0 lb

## 2022-03-08 DIAGNOSIS — Z7985 Long-term (current) use of injectable non-insulin antidiabetic drugs: Secondary | ICD-10-CM | POA: Diagnosis not present

## 2022-03-08 DIAGNOSIS — E669 Obesity, unspecified: Secondary | ICD-10-CM

## 2022-03-08 DIAGNOSIS — E1159 Type 2 diabetes mellitus with other circulatory complications: Secondary | ICD-10-CM

## 2022-03-08 DIAGNOSIS — Z6835 Body mass index (BMI) 35.0-35.9, adult: Secondary | ICD-10-CM

## 2022-03-08 DIAGNOSIS — Z794 Long term (current) use of insulin: Secondary | ICD-10-CM | POA: Diagnosis not present

## 2022-03-09 ENCOUNTER — Other Ambulatory Visit (INDEPENDENT_AMBULATORY_CARE_PROVIDER_SITE_OTHER): Payer: Self-pay | Admitting: Adult Health

## 2022-03-09 ENCOUNTER — Encounter (INDEPENDENT_AMBULATORY_CARE_PROVIDER_SITE_OTHER): Payer: Self-pay

## 2022-03-09 DIAGNOSIS — E1169 Type 2 diabetes mellitus with other specified complication: Secondary | ICD-10-CM

## 2022-03-09 NOTE — Progress Notes (Signed)
Chief Complaint:   OBESITY Pamela Love is here to discuss her progress with her obesity treatment plan along with follow-up of her obesity related diagnoses. Pamela Love is on the Category 2 Plan and states she is following her eating plan approximately 60% of the time. Pamela Love states she is using treadmill/strength 30 minutes 4 times per week.  Today's visit was #: 43 Starting weight: 227 lbs Starting date: 03/17/2020 Today's weight: 201 lbs Today's date: 03/08/2022 Total lbs lost to date: 26 lbs Total lbs lost since last in-office visit: 2  Interim History:  Pamela Love had a office visit with Endo/Dr. Elvera Lennox on 02/25/22. She increased Touejo from 16 units to 18 units.  Home readings after 03/02/22, Fasting--152, 135, 173, 169, 166. PP 110, 104, 126, 143, 105. PP <180.   Subjective:   1. Type 2 diabetes mellitus with other circulatory complication, with long-term current use of insulin (HCC) Pamela Love is currently on Mounjaro 7.5 mg every 5 days until 03/24/22, then she will increase to 10mg  once a week.  She denies mass in neck, dysphagia, dyspepsia, persistent hoarseness, abd pain, or N/V/Constipation. On 02/25/22, Dr. 02/27/22 increased Toujeo from 16 units to 18 units. Home readings after 03/02/22, Fasting--152, 135, 173, 169, 166. PP 110, 104, 126, 143, 105. PP <180.   Assessment/Plan:   1. Type 2 diabetes mellitus with other circulatory complication, with long-term current use of insulin (HCC) Pamela Love will follow Mounjaro titration per Dr. 05/02/22 and Pamela Love per Dr. Nathen May.  2. Obesity, Current BMI 35.7 Pamela Love is currently in the action stage of change. As such, her goal is to continue with weight loss efforts. She has agreed to the Category 2 Plan.   Exercise goals: As is.  Behavioral modification strategies: increasing lean protein intake, decreasing simple carbohydrates, meal planning and cooking strategies, keeping healthy foods in the home, and planning for success.  Pamela Love has agreed  to follow-up with our clinic in 3 weeks. She was informed of the importance of frequent follow-up visits to maximize her success with intensive lifestyle modifications for her multiple health conditions.   Objective:   Blood pressure 122/74, pulse 82, temperature (!) 97.2 F (36.2 C), height 5\' 3"  (1.6 m), weight 201 lb (91.2 kg), SpO2 90 %. Body mass index is 35.61 kg/m.  General: Cooperative, alert, well developed, in no acute distress. HEENT: Conjunctivae and lids unremarkable. Cardiovascular: Regular rhythm.  Lungs: Normal work of breathing. Neurologic: No focal deficits.   Lab Results  Component Value Date   CREATININE 1.09 (H) 11/13/2021   BUN 25 (H) 11/13/2021   NA 137 11/13/2021   K 3.9 11/13/2021   CL 100 11/13/2021   CO2 27 11/13/2021   Lab Results  Component Value Date   ALT 25 04/21/2020   AST 25 04/21/2020   ALKPHOS 104 04/21/2020   BILITOT 0.5 04/21/2020   Lab Results  Component Value Date   HGBA1C 6.1 (A) 02/25/2022   HGBA1C 5.8 (A) 10/14/2021   HGBA1C 6.1 (A) 07/12/2021   HGBA1C 5.8 (A) 04/12/2021   HGBA1C 7.0 (A) 12/10/2020   No results found for: "INSULIN" Lab Results  Component Value Date   TSH 1.71 05/30/2018   Lab Results  Component Value Date   CHOL 134 12/16/2020   HDL 41 12/16/2020   LDLCALC 68 12/16/2020   TRIG 142 12/16/2020   CHOLHDL 3.3 12/16/2020   Lab Results  Component Value Date   VD25OH 63.9 04/21/2020   Lab Results  Component Value Date  WBC 7.5 11/13/2021   HGB 14.0 11/13/2021   HCT 41.3 11/13/2021   MCV 93.0 11/13/2021   PLT 243 11/13/2021   No results found for: "IRON", "TIBC", "FERRITIN"  Attestation Statements:   Reviewed by clinician on day of visit: allergies, medications, problem list, medical history, surgical history, family history, social history, and previous encounter notes.  Time spent on visit including pre-visit chart review and post-visit care and charting was 28 minutes.   I, Brendell Tyus,  RMA, am acting as transcriptionist for William Hamburger, NP.  I have reviewed the above documentation for accuracy and completeness, and I agree with the above. -  Anuar Walgren d. Ernesto Zukowski, NP-C

## 2022-03-10 DIAGNOSIS — M79604 Pain in right leg: Secondary | ICD-10-CM | POA: Diagnosis not present

## 2022-03-10 DIAGNOSIS — M62561 Muscle wasting and atrophy, not elsewhere classified, right lower leg: Secondary | ICD-10-CM | POA: Diagnosis not present

## 2022-03-10 DIAGNOSIS — M545 Low back pain, unspecified: Secondary | ICD-10-CM | POA: Diagnosis not present

## 2022-03-15 DIAGNOSIS — M62561 Muscle wasting and atrophy, not elsewhere classified, right lower leg: Secondary | ICD-10-CM | POA: Diagnosis not present

## 2022-03-15 DIAGNOSIS — M545 Low back pain, unspecified: Secondary | ICD-10-CM | POA: Diagnosis not present

## 2022-03-15 DIAGNOSIS — M79604 Pain in right leg: Secondary | ICD-10-CM | POA: Diagnosis not present

## 2022-03-16 ENCOUNTER — Telehealth: Payer: Self-pay | Admitting: Cardiology

## 2022-03-16 DIAGNOSIS — I2510 Atherosclerotic heart disease of native coronary artery without angina pectoris: Secondary | ICD-10-CM

## 2022-03-16 MED ORDER — RANOLAZINE ER 1000 MG PO TB12: 1000.0000 mg | ORAL_TABLET | Freq: Two times a day (BID) | ORAL | 2 refills | Status: DC

## 2022-03-16 NOTE — Telephone Encounter (Signed)
Rx request sent to pharmacy.  

## 2022-03-16 NOTE — Telephone Encounter (Signed)
*  STAT* If patient is at the pharmacy, call can be transferred to refill team.   1. Which medications need to be refilled? (please list name of each medication and dose if known) ranolazine (RANEXA) 1000 MG SR tablet  2. Which pharmacy/location (including street and city if local pharmacy) is medication to be sent to? [id number] - [place], [place] - [address]  3. Do they need a 30 day or 90 day supply? 90

## 2022-03-17 DIAGNOSIS — M62561 Muscle wasting and atrophy, not elsewhere classified, right lower leg: Secondary | ICD-10-CM | POA: Diagnosis not present

## 2022-03-17 DIAGNOSIS — M545 Low back pain, unspecified: Secondary | ICD-10-CM | POA: Diagnosis not present

## 2022-03-17 DIAGNOSIS — M7918 Myalgia, other site: Secondary | ICD-10-CM | POA: Diagnosis not present

## 2022-03-17 DIAGNOSIS — M79604 Pain in right leg: Secondary | ICD-10-CM | POA: Diagnosis not present

## 2022-03-21 ENCOUNTER — Ambulatory Visit (INDEPENDENT_AMBULATORY_CARE_PROVIDER_SITE_OTHER): Payer: Medicare PPO | Admitting: Family Medicine

## 2022-03-21 ENCOUNTER — Encounter (INDEPENDENT_AMBULATORY_CARE_PROVIDER_SITE_OTHER): Payer: Self-pay | Admitting: Family Medicine

## 2022-03-21 VITALS — BP 115/75 | HR 86 | Temp 97.8°F | Ht 63.0 in | Wt 198.0 lb

## 2022-03-21 DIAGNOSIS — E1169 Type 2 diabetes mellitus with other specified complication: Secondary | ICD-10-CM | POA: Diagnosis not present

## 2022-03-21 DIAGNOSIS — Z7985 Long-term (current) use of injectable non-insulin antidiabetic drugs: Secondary | ICD-10-CM

## 2022-03-21 DIAGNOSIS — Z6835 Body mass index (BMI) 35.0-35.9, adult: Secondary | ICD-10-CM

## 2022-03-21 DIAGNOSIS — Z794 Long term (current) use of insulin: Secondary | ICD-10-CM | POA: Diagnosis not present

## 2022-03-21 DIAGNOSIS — K5909 Other constipation: Secondary | ICD-10-CM | POA: Diagnosis not present

## 2022-03-21 DIAGNOSIS — E66813 Obesity, class 3: Secondary | ICD-10-CM

## 2022-03-21 DIAGNOSIS — E669 Obesity, unspecified: Secondary | ICD-10-CM

## 2022-03-21 MED ORDER — TIRZEPATIDE 10 MG/0.5ML ~~LOC~~ SOAJ: 10.0000 mg | SUBCUTANEOUS | 0 refills | Status: DC

## 2022-03-24 ENCOUNTER — Telehealth: Payer: Self-pay | Admitting: Cardiology

## 2022-03-24 DIAGNOSIS — M545 Low back pain, unspecified: Secondary | ICD-10-CM | POA: Diagnosis not present

## 2022-03-24 DIAGNOSIS — M79604 Pain in right leg: Secondary | ICD-10-CM | POA: Diagnosis not present

## 2022-03-24 DIAGNOSIS — M62561 Muscle wasting and atrophy, not elsewhere classified, right lower leg: Secondary | ICD-10-CM | POA: Diagnosis not present

## 2022-03-24 DIAGNOSIS — M7918 Myalgia, other site: Secondary | ICD-10-CM | POA: Diagnosis not present

## 2022-03-24 NOTE — Telephone Encounter (Signed)
*  STAT* If patient is at the pharmacy, call can be transferred to refill team.   1. Which medications need to be refilled? (please list name of each medication and dose if known) lidocaine (XYLOCAINE) 2 % solution  2. Which pharmacy/location (including street and city if local pharmacy) is medication to be sent to? [id number] - [place], [place] - [address]  3. Do they need a 30 day or 90 day supply? 30

## 2022-03-24 NOTE — Telephone Encounter (Signed)
Routed to PCP as this is a non-cardiac medication.

## 2022-03-27 NOTE — Progress Notes (Unsigned)
Chief Complaint:   OBESITY Pamela Love is here to discuss her progress with her obesity treatment plan along with follow-up of her obesity related diagnoses. Pamela Love is on the Category 2 Plan and states she is following her eating plan approximately 65% of the time. Pamela Love states she is doing physical therapy, cardio, and dancing 45-60 minutes 1-2 times per week.  Today's visit was #: 44 Starting weight: 227 lbs Starting date: 03/17/2020 Today's weight: 198 lbs Today's date: 03/21/2022 Total lbs lost to date: 29 Total lbs lost since last in-office visit: 3  Interim History: Pamela Love is here for a follow up office visit.  We reviewed her meal plan and all questions were answered.  Patient's food recall appears to be accurate and consistent with what is on plan when she is following it.   When eating on plan, her hunger and cravings are well controlled.    Subjective:   1. Type 2 diabetes mellitus with other specified complication, with long-term current use of insulin (HCC) Pamela Love is at [facility] 10 mg now. She feels more full and has less cravings with the higher dose. Pt took 10 mg last week without side effects and will do the same this Thursday.  2. Other constipation Symptoms well controlled currently.  Assessment/Plan:   1. Type 2 diabetes mellitus with other specified complication, with long-term current use of insulin (HCC) A1c at goal at 6.1 approximately 3 weeks ago with Dr. Elvera Lennox. Good blood sugar control is important to decrease the likelihood of diabetic complications such as nephropathy, neuropathy, limb loss, blindness, coronary artery disease, and death. Intensive lifestyle modification including diet, exercise and weight loss are the first line of treatment for diabetes. Insulin management per endocrinology. Continue Mounjaro at 10 mg.  Refill- tirzepatide (MOUNJARO) 10 MG/0.5ML Pen; Inject 10 mg into the skin once a week. Every Thursday  Dispense: 2 mL; Refill:  0  2. Other constipation Pamela Love was informed that a decrease in bowel movement frequency is normal while losing weight, but stools should not be hard or painful. Orders and follow up as documented in patient record.  She is taking Miralax daily. Pt cautioned against increasing chance of symptoms with increased dose of Mounjaro. Increase fiber with high fiber wraps.  Counseling Getting to Good Bowel Health: Your goal is to have one soft bowel movement each day. Drink at least 8 glasses of water each day. Eat plenty of fiber (goal is over 25 grams each day). It is best to get most of your fiber from dietary sources which includes leafy green vegetables, fresh fruit, and whole grains. You may need to add fiber with the help of OTC fiber supplements. These include Metamucil, Citrucel, and Flaxseed. If you are still having trouble, try adding Miralax or Magnesium Citrate. If all of these changes do not work, Dietitian.  3. Obesity, Current BMI 35.1 Pamela Love is currently in the action stage of change. As such, her goal is to continue with weight loss efforts. She has agreed to the Category 2 Plan.   Pamela Love is encouraged to increase adherence to plan.  Exercise goals:  As is  Behavioral modification strategies: increasing lean protein intake, decreasing simple carbohydrates, and planning for success.  Pamela Love has agreed to follow-up with our clinic in 2 weeks. She was informed of the importance of frequent follow-up visits to maximize her success with intensive lifestyle modifications for her multiple health conditions.   Objective:   Blood pressure 115/75, pulse  86, temperature 97.8 F (36.6 C), height 5\' 3"  (1.6 m), weight 198 lb (89.8 kg), SpO2 99 %. Body mass index is 35.07 kg/m.  General: Cooperative, alert, well developed, in no acute distress. HEENT: Conjunctivae and lids unremarkable. Cardiovascular: Regular rhythm.  Lungs: Normal work of breathing. Neurologic: No focal  deficits.   Lab Results  Component Value Date   CREATININE 1.09 (H) 11/13/2021   BUN 25 (H) 11/13/2021   NA 137 11/13/2021   K 3.9 11/13/2021   CL 100 11/13/2021   CO2 27 11/13/2021   Lab Results  Component Value Date   ALT 25 04/21/2020   AST 25 04/21/2020   ALKPHOS 104 04/21/2020   BILITOT 0.5 04/21/2020   Lab Results  Component Value Date   HGBA1C 6.1 (A) 02/25/2022   HGBA1C 5.8 (A) 10/14/2021   HGBA1C 6.1 (A) 07/12/2021   HGBA1C 5.8 (A) 04/12/2021   HGBA1C 7.0 (A) 12/10/2020   No results found for: "INSULIN" Lab Results  Component Value Date   TSH 1.71 05/30/2018   Lab Results  Component Value Date   CHOL 134 12/16/2020   HDL 41 12/16/2020   LDLCALC 68 12/16/2020   TRIG 142 12/16/2020   CHOLHDL 3.3 12/16/2020   Lab Results  Component Value Date   VD25OH 63.9 04/21/2020   Lab Results  Component Value Date   WBC 7.5 11/13/2021   HGB 14.0 11/13/2021   HCT 41.3 11/13/2021   MCV 93.0 11/13/2021   PLT 243 11/13/2021   No results found for: "IRON", "TIBC", "FERRITIN"  Obesity Behavioral Intervention:   Approximately 15 minutes were spent on the discussion below.  ASK: We discussed the diagnosis of obesity with Pamela Love today and Pamela Love agreed to give Pamela Love permission to discuss obesity behavioral modification therapy today.  ASSESS: Pamela Love has the diagnosis of obesity and her BMI today is 35.1. Pamela Love is in the action stage of change.   ADVISE: Pamela Love was educated on the multiple health risks of obesity as well as the benefit of weight loss to improve her health. She was advised of the need for long term treatment and the importance of lifestyle modifications to improve her current health and to decrease her risk of future health problems.  AGREE: Multiple dietary modification options and treatment options were discussed and Pamela Love agreed to follow the recommendations documented in the above note.  ARRANGE: Pamela Love was educated on the importance of frequent  visits to treat obesity as outlined per CMS and USPSTF guidelines and agreed to schedule her next follow up appointment today.  Attestation Statements:   Reviewed by clinician on day of visit: allergies, medications, problem list, medical history, surgical history, family history, social history, and previous encounter notes.  I, Pamela Love, BS, CMA, am acting as transcriptionist for Kyung Rudd, DO.  I have reviewed the above documentation for accuracy and completeness, and I agree with the above. -  ***

## 2022-03-28 DIAGNOSIS — L821 Other seborrheic keratosis: Secondary | ICD-10-CM | POA: Diagnosis not present

## 2022-03-28 DIAGNOSIS — L814 Other melanin hyperpigmentation: Secondary | ICD-10-CM | POA: Diagnosis not present

## 2022-03-28 DIAGNOSIS — Z86018 Personal history of other benign neoplasm: Secondary | ICD-10-CM | POA: Diagnosis not present

## 2022-03-28 DIAGNOSIS — D225 Melanocytic nevi of trunk: Secondary | ICD-10-CM | POA: Diagnosis not present

## 2022-03-28 DIAGNOSIS — L570 Actinic keratosis: Secondary | ICD-10-CM | POA: Diagnosis not present

## 2022-03-28 DIAGNOSIS — L578 Other skin changes due to chronic exposure to nonionizing radiation: Secondary | ICD-10-CM | POA: Diagnosis not present

## 2022-03-28 DIAGNOSIS — Z85828 Personal history of other malignant neoplasm of skin: Secondary | ICD-10-CM | POA: Diagnosis not present

## 2022-03-30 ENCOUNTER — Telehealth: Payer: Self-pay | Admitting: Cardiology

## 2022-03-30 DIAGNOSIS — I2510 Atherosclerotic heart disease of native coronary artery without angina pectoris: Secondary | ICD-10-CM

## 2022-03-30 MED ORDER — CLOPIDOGREL BISULFATE 75 MG PO TABS: 75.0000 mg | ORAL_TABLET | Freq: Every day | ORAL | 2 refills | Status: DC

## 2022-03-30 NOTE — Telephone Encounter (Signed)
Rx refill sent to pharmacy. 

## 2022-03-30 NOTE — Telephone Encounter (Signed)
*  STAT* If patient is at the pharmacy, call can be transferred to refill team.   1. Which medications need to be refilled? (please list name of each medication and dose if known)  clopidogrel (PLAVIX) 75 MG tablet      2. Which pharmacy/location (including street and city if local pharmacy) is medication to be sent to? [id number] - [place], [place] - [address]  3. Do they need a 30 day or 90 day supply?   90 day supply

## 2022-03-31 DIAGNOSIS — M545 Low back pain, unspecified: Secondary | ICD-10-CM | POA: Diagnosis not present

## 2022-03-31 DIAGNOSIS — M62561 Muscle wasting and atrophy, not elsewhere classified, right lower leg: Secondary | ICD-10-CM | POA: Diagnosis not present

## 2022-03-31 DIAGNOSIS — M79604 Pain in right leg: Secondary | ICD-10-CM | POA: Diagnosis not present

## 2022-04-11 ENCOUNTER — Encounter (INDEPENDENT_AMBULATORY_CARE_PROVIDER_SITE_OTHER): Payer: Self-pay | Admitting: Adult Health

## 2022-04-11 ENCOUNTER — Ambulatory Visit (INDEPENDENT_AMBULATORY_CARE_PROVIDER_SITE_OTHER): Payer: Medicare PPO | Admitting: Family Medicine

## 2022-04-11 ENCOUNTER — Ambulatory Visit (INDEPENDENT_AMBULATORY_CARE_PROVIDER_SITE_OTHER): Payer: Medicare PPO | Admitting: Adult Health

## 2022-04-11 VITALS — BP 112/73 | HR 75 | Temp 98.3°F | Ht 63.0 in | Wt 199.0 lb

## 2022-04-11 DIAGNOSIS — Z794 Long term (current) use of insulin: Secondary | ICD-10-CM

## 2022-04-11 DIAGNOSIS — E1169 Type 2 diabetes mellitus with other specified complication: Secondary | ICD-10-CM | POA: Diagnosis not present

## 2022-04-11 DIAGNOSIS — Z6835 Body mass index (BMI) 35.0-35.9, adult: Secondary | ICD-10-CM

## 2022-04-11 DIAGNOSIS — Z7985 Long-term (current) use of injectable non-insulin antidiabetic drugs: Secondary | ICD-10-CM | POA: Diagnosis not present

## 2022-04-11 DIAGNOSIS — Z6841 Body Mass Index (BMI) 40.0 and over, adult: Secondary | ICD-10-CM

## 2022-04-11 DIAGNOSIS — E669 Obesity, unspecified: Secondary | ICD-10-CM

## 2022-04-12 DIAGNOSIS — M62561 Muscle wasting and atrophy, not elsewhere classified, right lower leg: Secondary | ICD-10-CM | POA: Diagnosis not present

## 2022-04-12 DIAGNOSIS — M545 Low back pain, unspecified: Secondary | ICD-10-CM | POA: Diagnosis not present

## 2022-04-12 DIAGNOSIS — M79604 Pain in right leg: Secondary | ICD-10-CM | POA: Diagnosis not present

## 2022-04-14 NOTE — Progress Notes (Unsigned)
Chief Complaint:   OBESITY Pamela Love is here to discuss her progress with her obesity treatment plan along with follow-up of her obesity related diagnoses. Pamela Love is on the Category 2 Plan and states she is following her eating plan approximately 65% of the time. Pamela Love states she is doing PT and went to the beach 50 minutes 2 times per week.  Today's visit was #: 44 Starting weight: 227 lbs Starting date: 03/17/2020 Today's weight: 199 lbs Today's date: 04/11/2022 Total lbs lost to date: 28 lbs Total lbs lost since last in-office visit: +1 lb  Interim History: 02/25/2022, A1c 6.1 with continued elevated fasting.   Subjective:   1. Type 2 diabetes mellitus with other specified complication, with long-term current use of insulin Pamela Love) Endocrinology increased Touejo from 16 units to 18 units.  Has elevated morning blood glucose.  Fasting blood glucose:  159, 162, 163, 162, 154, 146, 171, 146, 148, 170.  She increased Mounjaro 10 mg on 03/24/2022.  Assessment/Plan:   1. Type 2 diabetes mellitus with other specified complication, with long-term current use of insulin (HCC) Continue Mounjaro 10 mg once weekly, no need for refill today.   2. Obesity, Current BMI 35.4 Pamela Love is currently in the action stage of change. As such, her goal is to continue with weight loss efforts. She has agreed to the Category 2 Plan.   Exercise goals:  As is.   Behavioral modification strategies: increasing lean protein intake, decreasing simple carbohydrates, meal planning and cooking strategies, keeping healthy foods in the home, and planning for success.  Pamela Love has agreed to follow-up with our clinic in 2 weeks. She was informed of the importance of frequent follow-up visits to maximize her success with intensive lifestyle modifications for her multiple health conditions.   ***delete paragraph if no labs orderedLynda was informed we would discuss her lab results at her next visit unless there is a  critical issue that needs to be addressed sooner. Pamela Love agreed to keep her next visit at the agreed upon time to discuss these results.  Objective:   Blood pressure 112/73, pulse 75, temperature 98.3 F (36.8 C), height 5\' 3"  (1.6 m), weight 199 lb (90.3 kg), SpO2 97 %. Body mass index is 35.25 kg/m.  General: Cooperative, alert, well developed, in no acute distress. HEENT: Conjunctivae and lids unremarkable. Cardiovascular: Regular rhythm.  Lungs: Normal work of breathing. Neurologic: No focal deficits.   Lab Results  Component Value Date   CREATININE 1.09 (H) 11/13/2021   BUN 25 (H) 11/13/2021   NA 137 11/13/2021   K 3.9 11/13/2021   CL 100 11/13/2021   CO2 27 11/13/2021   Lab Results  Component Value Date   ALT 25 04/21/2020   AST 25 04/21/2020   ALKPHOS 104 04/21/2020   BILITOT 0.5 04/21/2020   Lab Results  Component Value Date   HGBA1C 6.1 (A) 02/25/2022   HGBA1C 5.8 (A) 10/14/2021   HGBA1C 6.1 (A) 07/12/2021   HGBA1C 5.8 (A) 04/12/2021   HGBA1C 7.0 (A) 12/10/2020   No results found for: "INSULIN" Lab Results  Component Value Date   TSH 1.71 05/30/2018   Lab Results  Component Value Date   CHOL 134 12/16/2020   HDL 41 12/16/2020   LDLCALC 68 12/16/2020   TRIG 142 12/16/2020   CHOLHDL 3.3 12/16/2020   Lab Results  Component Value Date   VD25OH 63.9 04/21/2020   Lab Results  Component Value Date   WBC 7.5 11/13/2021  HGB 14.0 11/13/2021   HCT 41.3 11/13/2021   MCV 93.0 11/13/2021   PLT 243 11/13/2021   No results found for: "IRON", "TIBC", "FERRITIN"  Obesity Behavioral Intervention:   Approximately 15 minutes were spent on the discussion below.  ASK: We discussed the diagnosis of obesity with Jeslie today and Sheronica agreed to give [place] permission to discuss obesity behavioral modification therapy today.  ASSESS: Elizabeth has the diagnosis of obesity and her BMI today is 35.4. Iver is in the action stage of change.   ADVISE: Jaquesha was  educated on the multiple health risks of obesity as well as the benefit of weight loss to improve her health. She was advised of the need for long term treatment and the importance of lifestyle modifications to improve her current health and to decrease her risk of future health problems.  AGREE: Multiple dietary modification options and treatment options were discussed and Syla agreed to follow the recommendations documented in the above note.  ARRANGE: Tonda was educated on the importance of frequent visits to treat obesity as outlined per CMS and USPSTF guidelines and agreed to schedule her next follow up appointment today.  Attestation Statements:   Reviewed by clinician on day of visit: allergies, medications, problem list, medical history, surgical history, family history, social history, and previous encounter notes.  Time spent on visit including pre-visit chart review and post-visit care and charting was 28 minutes.   I, Malcolm Metro, RMA, am acting as Energy manager for William Hamburger, NP.  I have reviewed the above documentation for accuracy and completeness, and I agree with the above. -  ***

## 2022-04-18 ENCOUNTER — Ambulatory Visit (INDEPENDENT_AMBULATORY_CARE_PROVIDER_SITE_OTHER): Payer: Medicare PPO | Admitting: Family Medicine

## 2022-04-18 DIAGNOSIS — M7531 Calcific tendinitis of right shoulder: Secondary | ICD-10-CM | POA: Diagnosis not present

## 2022-04-18 DIAGNOSIS — M1991 Primary osteoarthritis, unspecified site: Secondary | ICD-10-CM | POA: Diagnosis not present

## 2022-04-18 DIAGNOSIS — M1120 Other chondrocalcinosis, unspecified site: Secondary | ICD-10-CM | POA: Diagnosis not present

## 2022-04-18 DIAGNOSIS — Z6835 Body mass index (BMI) 35.0-35.9, adult: Secondary | ICD-10-CM | POA: Diagnosis not present

## 2022-04-18 DIAGNOSIS — M7532 Calcific tendinitis of left shoulder: Secondary | ICD-10-CM | POA: Diagnosis not present

## 2022-04-18 DIAGNOSIS — E669 Obesity, unspecified: Secondary | ICD-10-CM | POA: Diagnosis not present

## 2022-04-21 ENCOUNTER — Telehealth: Payer: Self-pay | Admitting: Cardiology

## 2022-04-21 DIAGNOSIS — L570 Actinic keratosis: Secondary | ICD-10-CM | POA: Insufficient documentation

## 2022-04-21 DIAGNOSIS — I2510 Atherosclerotic heart disease of native coronary artery without angina pectoris: Secondary | ICD-10-CM

## 2022-04-21 DIAGNOSIS — L821 Other seborrheic keratosis: Secondary | ICD-10-CM | POA: Insufficient documentation

## 2022-04-21 DIAGNOSIS — M1991 Primary osteoarthritis, unspecified site: Secondary | ICD-10-CM | POA: Insufficient documentation

## 2022-04-21 MED ORDER — CARVEDILOL 3.125 MG PO TABS: 3.1250 mg | ORAL_TABLET | Freq: Two times a day (BID) | ORAL | 3 refills | Status: DC

## 2022-04-21 NOTE — Telephone Encounter (Signed)
Rx sent 

## 2022-04-21 NOTE — Telephone Encounter (Signed)
*  STAT* If patient is at the pharmacy, call can be transferred to refill team.   1. Which medications need to be refilled? (please list name of each medication and dose if known)  carvedilol (COREG) 3.125 MG tablet  2. Which pharmacy/location (including street and city if local pharmacy) is medication to be sent to? [id number] - [place], [place] - [address]  3. Do they need a 30 day or 90 day supply?   90 day supply  

## 2022-04-25 ENCOUNTER — Encounter (INDEPENDENT_AMBULATORY_CARE_PROVIDER_SITE_OTHER): Payer: Self-pay | Admitting: Family Medicine

## 2022-04-25 ENCOUNTER — Ambulatory Visit (INDEPENDENT_AMBULATORY_CARE_PROVIDER_SITE_OTHER): Payer: Medicare PPO | Admitting: Family Medicine

## 2022-04-25 VITALS — BP 124/77 | HR 75 | Temp 97.9°F | Ht 63.0 in | Wt 198.0 lb

## 2022-04-25 DIAGNOSIS — Z6835 Body mass index (BMI) 35.0-35.9, adult: Secondary | ICD-10-CM

## 2022-04-25 DIAGNOSIS — E1169 Type 2 diabetes mellitus with other specified complication: Secondary | ICD-10-CM | POA: Diagnosis not present

## 2022-04-25 DIAGNOSIS — Z7985 Long-term (current) use of injectable non-insulin antidiabetic drugs: Secondary | ICD-10-CM

## 2022-04-25 DIAGNOSIS — E669 Obesity, unspecified: Secondary | ICD-10-CM | POA: Diagnosis not present

## 2022-04-25 DIAGNOSIS — Z794 Long term (current) use of insulin: Secondary | ICD-10-CM

## 2022-04-25 MED ORDER — TIRZEPATIDE 10 MG/0.5ML ~~LOC~~ SOAJ: 10.0000 mg | SUBCUTANEOUS | 0 refills | Status: DC

## 2022-04-28 NOTE — Progress Notes (Signed)
Chief Complaint:   OBESITY Pamela Love is here to discuss her progress with her obesity treatment plan along with follow-up of her obesity related diagnoses. Pamela Love is on the Category 2 Plan and states she is following her eating plan approximately 70% of the time. Pamela Love states she is dancing, cardio rehab, and water aerobics 35-70 minutes 1 time per week for each activity.  Today's visit was #: 2 Starting weight: 227 lbs Starting date: 03/17/2020 Today's weight: 198 lbs Today's date: 04/25/2022 Total lbs lost to date: 29 Total lbs lost since last in-office visit: 1  Interim History: Pamela Love says, "I did really well these past couple of weeks. I focused on increasing proteins." Pt gained 1.2 lbs muscle mass and lost almost a pound in fat mass.  Subjective:   1. Type 2 diabetes mellitus with other specified complication, with long-term current use of insulin ([place]) Pamela Love's blood sugars have decreased over the past couple of weeks- 170, 137, 138, 113, 95.  Assessment/Plan:  No orders of the defined types were placed in this encounter.   Medications Discontinued During This Encounter  Medication Reason   tirzepatide Darcel Bayley) 10 MG/0.5ML Pen Reorder     Meds ordered this encounter  Medications   tirzepatide (MOUNJARO) 10 MG/0.5ML Pen    Sig: Inject 10 mg into the skin once a week. Every thursday    Dispense:  2 mL    Refill:  0     1. Type 2 diabetes mellitus with other specified complication, with long-term current use of insulin (HCC) Good blood sugar control is important to decrease the likelihood of diabetic complications such as nephropathy, neuropathy, limb loss, blindness, coronary artery disease, and death. Intensive lifestyle modification including diet, exercise and weight loss are the first line of treatment for diabetes.  Continue 18 units of Toujeo. Prevention of hypoglycemia discussed with pt.  Refill- tirzepatide (MOUNJARO) 10 MG/0.5ML Pen; Inject 10 mg into  the skin once a week. Every Thursday  Dispense: 2 mL; Refill: 0  2. Obesity, Current BMI 35.1 Pamela Love is currently in the action stage of change. As such, her goal is to continue with weight loss efforts. She has agreed to the Category 2 Plan.   Exercise goals: For substantial health benefits, adults should do at least 150 minutes (2 hours and 30 minutes) a week of moderate-intensity, or 75 minutes (1 hour and 15 minutes) a week of vigorous-intensity aerobic physical activity, or an equivalent combination of moderate- and vigorous-intensity aerobic activity. Aerobic activity should be performed in episodes of at least 10 minutes, and preferably, it should be spread throughout the week.  Behavioral modification strategies: increasing lean protein intake, decreasing simple carbohydrates, and planning for success.  Pamela Love has agreed to follow-up with our clinic in 2 weeks with NP [facility] and 4 weeks with me. She was informed of the importance of frequent follow-up visits to maximize her success with intensive lifestyle modifications for her multiple health conditions.   Objective:   Blood pressure 124/77, pulse 75, temperature 97.9 F (36.6 C), height 5\' 3"  (1.6 m), weight 198 lb (89.8 kg), SpO2 100 %. Body mass index is 35.07 kg/m.  General: Cooperative, alert, well developed, in no acute distress. HEENT: Conjunctivae and lids unremarkable. Cardiovascular: Regular rhythm.  Lungs: Normal work of breathing. Neurologic: No focal deficits.   Lab Results  Component Value Date   CREATININE 1.09 (H) 11/13/2021   BUN 25 (H) 11/13/2021   NA 137 11/13/2021   K  3.9 11/13/2021   CL 100 11/13/2021   CO2 27 11/13/2021   Lab Results  Component Value Date   ALT 25 04/21/2020   AST 25 04/21/2020   ALKPHOS 104 04/21/2020   BILITOT 0.5 04/21/2020   Lab Results  Component Value Date   HGBA1C 6.1 (A) 02/25/2022   HGBA1C 5.8 (A) 10/14/2021   HGBA1C 6.1 (A) 07/12/2021   HGBA1C 5.8 (A) 04/12/2021    HGBA1C 7.0 (A) 12/10/2020   No results found for: "INSULIN" Lab Results  Component Value Date   TSH 1.71 05/30/2018   Lab Results  Component Value Date   CHOL 134 12/16/2020   HDL 41 12/16/2020   LDLCALC 68 12/16/2020   TRIG 142 12/16/2020   CHOLHDL 3.3 12/16/2020   Lab Results  Component Value Date   VD25OH 63.9 04/21/2020   Lab Results  Component Value Date   WBC 7.5 11/13/2021   HGB 14.0 11/13/2021   HCT 41.3 11/13/2021   MCV 93.0 11/13/2021   PLT 243 11/13/2021   No results found for: "IRON", "TIBC", "FERRITIN"  Obesity Behavioral Intervention:   Approximately 15 minutes were spent on the discussion below.  ASK: We discussed the diagnosis of obesity with Karalyne today and Cruz agreed to give [place] permission to discuss obesity behavioral modification therapy today.  ASSESS: Tsuyako has the diagnosis of obesity and her BMI today is 35.1. Laynee is in the action stage of change.   ADVISE: Nandini was educated on the multiple health risks of obesity as well as the benefit of weight loss to improve her health. She was advised of the need for long term treatment and the importance of lifestyle modifications to improve her current health and to decrease her risk of future health problems.  AGREE: Multiple dietary modification options and treatment options were discussed and Jonnie agreed to follow the recommendations documented in the above note.  ARRANGE: Leotta was educated on the importance of frequent visits to treat obesity as outlined per CMS and USPSTF guidelines and agreed to schedule her next follow up appointment today.  Attestation Statements:   Reviewed by clinician on day of visit: allergies, medications, problem list, medical history, surgical history, family history, social history, and previous encounter notes.  I, Kathlene November, BS, CMA, am acting as transcriptionist for [employer], DO.   I have reviewed the above documentation for accuracy and  completeness, and I agree with the above. [name], D.O.  The [place] was signed into law in 2016 which includes the topic of electronic health records.  This provides immediate access to information in MyChart.  This includes consultation notes, operative notes, office notes, lab results and pathology reports.  If you have any questions about what you read please let us know at your next visit so we can discuss your concerns and take corrective action if need be.  We are right here with you.

## 2022-05-09 ENCOUNTER — Encounter (INDEPENDENT_AMBULATORY_CARE_PROVIDER_SITE_OTHER): Payer: Self-pay | Admitting: Family Medicine

## 2022-05-09 ENCOUNTER — Ambulatory Visit (INDEPENDENT_AMBULATORY_CARE_PROVIDER_SITE_OTHER): Payer: Medicare PPO | Admitting: Family Medicine

## 2022-05-09 VITALS — BP 132/80 | HR 100 | Temp 98.1°F | Ht 63.0 in | Wt 199.0 lb

## 2022-05-09 DIAGNOSIS — Z794 Long term (current) use of insulin: Secondary | ICD-10-CM | POA: Diagnosis not present

## 2022-05-09 DIAGNOSIS — Z6841 Body Mass Index (BMI) 40.0 and over, adult: Secondary | ICD-10-CM

## 2022-05-09 DIAGNOSIS — E1169 Type 2 diabetes mellitus with other specified complication: Secondary | ICD-10-CM

## 2022-05-09 DIAGNOSIS — Z6835 Body mass index (BMI) 35.0-35.9, adult: Secondary | ICD-10-CM | POA: Diagnosis not present

## 2022-05-09 DIAGNOSIS — E669 Obesity, unspecified: Secondary | ICD-10-CM

## 2022-05-09 DIAGNOSIS — Z7985 Long-term (current) use of injectable non-insulin antidiabetic drugs: Secondary | ICD-10-CM | POA: Diagnosis not present

## 2022-05-09 MED ORDER — TIRZEPATIDE 10 MG/0.5ML ~~LOC~~ SOAJ: 10.0000 mg | SUBCUTANEOUS | 0 refills | Status: DC

## 2022-05-13 ENCOUNTER — Other Ambulatory Visit: Payer: Self-pay

## 2022-05-13 ENCOUNTER — Other Ambulatory Visit (HOSPITAL_BASED_OUTPATIENT_CLINIC_OR_DEPARTMENT_OTHER): Payer: Self-pay | Admitting: Cardiology

## 2022-05-13 DIAGNOSIS — E1169 Type 2 diabetes mellitus with other specified complication: Secondary | ICD-10-CM

## 2022-05-13 DIAGNOSIS — I2510 Atherosclerotic heart disease of native coronary artery without angina pectoris: Secondary | ICD-10-CM

## 2022-05-13 MED ORDER — BD PEN NEEDLE NANO U/F 32G X 4 MM MISC: 1.0000 | Freq: Every day | 3 refills | Status: DC

## 2022-05-13 NOTE — Telephone Encounter (Signed)
Rx(s) sent to pharmacy electronically.  

## 2022-05-16 NOTE — Progress Notes (Unsigned)
Chief Complaint:   OBESITY Pamela Love is here to discuss her progress with her obesity treatment plan along with follow-up of her obesity related diagnoses. Pamela Love is on the Category 2 Plan and states she is following her eating plan approximately 75% of the time. Pamela Love states she is dancing/doing cardio lab 60 minutes 4 times per week.  Today's visit was #: 46 Starting weight: 227 lbs Starting date: 03/17/2020 Today's weight: 199 lbs Today's date: 05/09/2022 Total lbs lost to date: 28 Total lbs lost since last in-office visit: +1  Interim History: Pamela Love is here for a follow up office visit.  We reviewed her meal plan and all questions were answered.  Patient's food recall appears to be accurate and consistent with what is on plan when she is following it.   When eating on plan, her hunger and cravings are well controlled.   Pamela Love has been eating out more.  Subjective:   1. Type 2 diabetes mellitus with other specified complication, with long-term current use of insulin (HCC) Pamela Love's fasting blood sugars have been 161, 134, 161. Her A1c was 6.1 on 02/25/2022.  Assessment/Plan:  No orders of the defined types were placed in this encounter.   Medications Discontinued During This Encounter  Medication Reason   tirzepatide Darcel [facility]ley) 10 MG/0.5ML Pen Reorder     Meds ordered this encounter  Medications   tirzepatide (MOUNJARO) 10 MG/0.5ML Pen    Sig: Inject 10 mg into the skin once a week. Every thursday    Dispense:  2 mL    Refill:  0     1. Type 2 diabetes mellitus with other specified complication, with long-term current use of insulin (HCC) Good blood sugar control is important to decrease the likelihood of diabetic complications such as nephropathy, neuropathy, limb loss, blindness, coronary artery disease, and death. Intensive lifestyle modification including diet, exercise and weight loss are the first line of treatment for diabetes.  Continue checking blood  sugars as is.  Refill- tirzepatide (MOUNJARO) 10 MG/0.5ML Pen; Inject 10 mg into the skin once a week. Every Thursday  Dispense: 2 mL; Refill: 0  2. Obesity, Current BMI 35.3 Pamela Love is currently in the action stage of change. As such, her goal is to continue with weight loss efforts. She has agreed to the Category 2 Plan.   Strategies for eating out and/or with friends discussed with pt. Pamela Love will try to move dinner to lunch or eat prior to going out with friends.  Exercise goals:  As is  Behavioral modification strategies: meal planning and cooking strategies, keeping healthy foods in the home, and better snacking choices.  Pamela Love has agreed to follow-up with our clinic in 2-3 weeks. She was informed of the importance of frequent follow-up visits to maximize her success with intensive lifestyle modifications for her multiple health conditions.   Objective:   Blood pressure 132/80, pulse 100, temperature 98.1 F (36.7 C), height 5\' 3"  (1.6 m), weight 199 lb (90.3 kg), SpO2 98 %. Body mass index is 35.25 kg/m.  General: Cooperative, alert, well developed, in no acute distress. HEENT: Conjunctivae and lids unremarkable. Cardiovascular: Regular rhythm.  Lungs: Normal work of breathing. Neurologic: No focal deficits.   Lab Results  Component Value Date   CREATININE 1.09 (H) 11/13/2021   BUN 25 (H) 11/13/2021   NA 137 11/13/2021   K 3.9 11/13/2021   CL 100 11/13/2021   CO2 27 11/13/2021   Lab Results  Component Value Date  ALT 25 04/21/2020   AST 25 04/21/2020   ALKPHOS 104 04/21/2020   BILITOT 0.5 04/21/2020   Lab Results  Component Value Date   HGBA1C 6.1 (A) 02/25/2022   HGBA1C 5.8 (A) 10/14/2021   HGBA1C 6.1 (A) 07/12/2021   HGBA1C 5.8 (A) 04/12/2021   HGBA1C 7.0 (A) 12/10/2020   No results found for: "INSULIN" Lab Results  Component Value Date   TSH 1.71 05/30/2018   Lab Results  Component Value Date   CHOL 134 12/16/2020   HDL 41 12/16/2020   LDLCALC 68  12/16/2020   TRIG 142 12/16/2020   CHOLHDL 3.3 12/16/2020   Lab Results  Component Value Date   VD25OH 63.9 04/21/2020   Lab Results  Component Value Date   WBC 7.5 11/13/2021   HGB 14.0 11/13/2021   HCT 41.3 11/13/2021   MCV 93.0 11/13/2021   PLT 243 11/13/2021   No results found for: "IRON", "TIBC", "FERRITIN"  Obesity Behavioral Intervention:   Approximately 15 minutes were spent on the discussion below.  ASK: We discussed the diagnosis of obesity with Stevi today and Sakura agreed to give [place] permission to discuss obesity behavioral modification therapy today.  ASSESS: Diavian has the diagnosis of obesity and her BMI today is 35.3. Heavenlee is in the action stage of change.   ADVISE: Tymeka was educated on the multiple health risks of obesity as well as the benefit of weight loss to improve her health. She was advised of the need for long term treatment and the importance of lifestyle modifications to improve her current health and to decrease her risk of future health problems.  AGREE: Multiple dietary modification options and treatment options were discussed and Dwanna agreed to follow the recommendations documented in the above note.  ARRANGE: Jaunita was educated on the importance of frequent visits to treat obesity as outlined per CMS and USPSTF guidelines and agreed to schedule her next follow up appointment today.  Attestation Statements:   Reviewed by clinician on day of visit: allergies, medications, problem list, medical history, surgical history, family history, social history, and previous encounter notes.  I, Kathlene November, BS, CMA, am acting as transcriptionist for [employer], DO.  I have reviewed the above documentation for accuracy and completeness, and I agree with the above. [name], D.O.  The [place] was signed into law in 2016 which includes the topic of electronic health records.  This provides immediate access to information  in MyChart.  This includes consultation notes, operative notes, office notes, lab results and pathology reports.  If you have any questions about what you read please let us know at your next visit so we can discuss your concerns and take corrective action if need be.  We are right here with you.

## 2022-05-23 ENCOUNTER — Ambulatory Visit (INDEPENDENT_AMBULATORY_CARE_PROVIDER_SITE_OTHER): Payer: Medicare PPO | Admitting: Family Medicine

## 2022-05-23 ENCOUNTER — Encounter (INDEPENDENT_AMBULATORY_CARE_PROVIDER_SITE_OTHER): Payer: Self-pay | Admitting: Family Medicine

## 2022-05-23 VITALS — BP 130/78 | HR 76 | Temp 98.2°F | Ht 63.0 in | Wt 197.0 lb

## 2022-05-23 DIAGNOSIS — Z7985 Long-term (current) use of injectable non-insulin antidiabetic drugs: Secondary | ICD-10-CM

## 2022-05-23 DIAGNOSIS — E669 Obesity, unspecified: Secondary | ICD-10-CM | POA: Diagnosis not present

## 2022-05-23 DIAGNOSIS — E1169 Type 2 diabetes mellitus with other specified complication: Secondary | ICD-10-CM

## 2022-05-23 DIAGNOSIS — Z794 Long term (current) use of insulin: Secondary | ICD-10-CM

## 2022-05-23 DIAGNOSIS — Z6834 Body mass index (BMI) 34.0-34.9, adult: Secondary | ICD-10-CM

## 2022-05-23 MED ORDER — TIRZEPATIDE 12.5 MG/0.5ML ~~LOC~~ SOAJ: 12.5000 mg | SUBCUTANEOUS | 0 refills | Status: DC

## 2022-06-06 ENCOUNTER — Encounter (INDEPENDENT_AMBULATORY_CARE_PROVIDER_SITE_OTHER): Payer: Self-pay | Admitting: Family Medicine

## 2022-06-06 ENCOUNTER — Ambulatory Visit (INDEPENDENT_AMBULATORY_CARE_PROVIDER_SITE_OTHER): Payer: Medicare PPO | Admitting: Family Medicine

## 2022-06-06 VITALS — BP 122/74 | HR 7 | Temp 97.8°F | Ht 63.0 in | Wt 199.8 lb

## 2022-06-06 DIAGNOSIS — E669 Obesity, unspecified: Secondary | ICD-10-CM

## 2022-06-06 DIAGNOSIS — E1159 Type 2 diabetes mellitus with other circulatory complications: Secondary | ICD-10-CM | POA: Diagnosis not present

## 2022-06-06 DIAGNOSIS — M8580 Other specified disorders of bone density and structure, unspecified site: Secondary | ICD-10-CM | POA: Diagnosis not present

## 2022-06-06 DIAGNOSIS — Z7985 Long-term (current) use of injectable non-insulin antidiabetic drugs: Secondary | ICD-10-CM

## 2022-06-06 DIAGNOSIS — Z6835 Body mass index (BMI) 35.0-35.9, adult: Secondary | ICD-10-CM | POA: Diagnosis not present

## 2022-06-06 DIAGNOSIS — E66813 Obesity, class 3: Secondary | ICD-10-CM

## 2022-06-06 DIAGNOSIS — Z794 Long term (current) use of insulin: Secondary | ICD-10-CM | POA: Diagnosis not present

## 2022-06-06 NOTE — Progress Notes (Unsigned)
Chief Complaint:   OBESITY Pamela Love is here to discuss her progress with her obesity treatment plan along with follow-up of her obesity related diagnoses. Pamela Love is on the Category 2 Plan and states she is following her eating plan approximately 85 % of the time. Pamela Love states she is dancing, and doing cardio/abs for 60 minutes 3 times per week.  Today's visit was #: 61 Starting weight: 227 lbs Starting date: 03/17/2020 Today's weight: 197 lbs Today's date: 05/23/2022 Total lbs lost to date: 30 Total lbs lost since last in-office visit: 2  Interim History: Pamela Love is here for a follow up office visit.  We reviewed her meal plan and all questions were answered.  Patient's food recall appears to be accurate and consistent with what is on plan when she is following it.   When eating on plan, her hunger and cravings are well controlled.    Subjective:   1. Type 2 diabetes mellitus with other specified complication, with long-term current use of insulin (HCC) Patient's fasting blood sugars range at 168, 169, and 159.  Her PCP increased her insulin in July 2023 without change in blood sugars in the morning.  She is on Toujeo 18 mg and Mounjaro 10 mg.  Her last A1c was 6.1 on 02/25/2022.  She desires an increase in [place] dose with Dr. Cruzita Lederer, and with higher blood sugars we will increase her dose today.  Assessment/Plan:  No orders of the defined types were placed in this encounter.   Medications Discontinued During This Encounter  Medication Reason   tirzepatide [facility]) 10 MG/0.5ML Pen Dose change     Meds ordered this encounter  Medications   tirzepatide (MOUNJARO) 12.5 MG/0.5ML Pen    Sig: Inject 12.5 mg into the skin once a week. Q thurs    Dispense:  2 mL    Refill:  0     1. Type 2 diabetes mellitus with other specified complication, with long-term current use of insulin [facility]) Patient agreed to start Mounjaro 12.5 mg every Thursday.  She will continue with  her care per endocrinology and will continue blood sugar monitoring, and we will follow her prudent nutritional plan.  - tirzepatide (MOUNJARO) 12.5 MG/0.5ML Pen; Inject 12.5 mg into the skin once a week. Q thurs  Dispense: 2 mL; Refill: 0  2. Obesity, Current BMI 34.9 Pamela Love is currently in the action stage of change. As such, her goal is to continue with weight loss efforts. She has agreed to the Category 2 Plan with breakfast and lunch options, and she will journal her intake and log it.   Exercise goals: Increase as tolerated.   Behavioral modification strategies: increasing lean protein intake and keeping a strict food journal.  Pamela Love has agreed to follow-up with our clinic in 2 weeks. She was informed of the importance of frequent follow-up visits to maximize her success with intensive lifestyle modifications for her multiple health conditions.   Objective:   Blood pressure 130/78, pulse 76, temperature 98.2 F (36.8 C), height 5\' 3"  (1.6 m), weight 197 lb (89.4 kg), SpO2 99 %. Body mass index is 34.9 kg/m.  General: Cooperative, alert, well developed, in no acute distress. HEENT: Conjunctivae and lids unremarkable. Cardiovascular: Regular rhythm.  Lungs: Normal work of breathing. Neurologic: No focal deficits.   Lab Results  Component Value Date   CREATININE 1.09 (H) 11/13/2021   BUN 25 (H) 11/13/2021   NA 137 11/13/2021   K 3.9 11/13/2021  CL 100 11/13/2021   CO2 27 11/13/2021   Lab Results  Component Value Date   ALT 25 04/21/2020   AST 25 04/21/2020   ALKPHOS 104 04/21/2020   BILITOT 0.5 04/21/2020   Lab Results  Component Value Date   HGBA1C 6.1 (A) 02/25/2022   HGBA1C 5.8 (A) 10/14/2021   HGBA1C 6.1 (A) 07/12/2021   HGBA1C 5.8 (A) 04/12/2021   HGBA1C 7.0 (A) 12/10/2020   No results found for: "INSULIN" Lab Results  Component Value Date   TSH 1.71 05/30/2018   Lab Results  Component Value Date   CHOL 134 12/16/2020   HDL 41 12/16/2020   LDLCALC 68  12/16/2020   TRIG 142 12/16/2020   CHOLHDL 3.3 12/16/2020   Lab Results  Component Value Date   VD25OH 63.9 04/21/2020   Lab Results  Component Value Date   WBC 7.5 11/13/2021   HGB 14.0 11/13/2021   HCT 41.3 11/13/2021   MCV 93.0 11/13/2021   PLT 243 11/13/2021   No results found for: "IRON", "TIBC", "FERRITIN"  Obesity Behavioral Intervention:   Approximately 15 minutes were spent on the discussion below.  ASK: We discussed the diagnosis of obesity with Adaisha today and Denetta agreed to give [place] permission to discuss obesity behavioral modification therapy today.  ASSESS: Pamela Love has the diagnosis of obesity and her BMI today is 34.9. Pamela Love is in the action stage of change.   ADVISE: Pamela Love was educated on the multiple health risks of obesity as well as the benefit of weight loss to improve her health. She was advised of the need for long term treatment and the importance of lifestyle modifications to improve her current health and to decrease her risk of future health problems.  AGREE: Multiple dietary modification options and treatment options were discussed and Pamela Love agreed to follow the recommendations documented in the above note.  ARRANGE: Pamela Love was educated on the importance of frequent visits to treat obesity as outlined per CMS and USPSTF guidelines and agreed to schedule her next follow up appointment today.  Attestation Statements:   Reviewed by clinician on day of visit: allergies, medications, problem list, medical history, surgical history, family history, social history, and previous encounter notes.   [name], am acting as transcriptionist for [employer], DO.  I have reviewed the above documentation for accuracy and completeness, and I agree with the above. [name], D.O.  The [place] was signed into law in 2016 which includes the topic of electronic health records.  This provides immediate access to information in  MyChart.  This includes consultation notes, operative notes, office notes, lab results and pathology reports.  If you have any questions about what you read please let us know at your next visit so we can discuss your concerns and take corrective action if need be.  We are right here with you.

## 2022-06-07 LAB — HEMOGLOBIN A1C
Est. average glucose Bld gHb Est-mCnc: 126 mg/dL
Hgb A1c MFr Bld: 6 % — ABNORMAL HIGH (ref 4.8–5.6)

## 2022-06-07 LAB — VITAMIN D 25 HYDROXY (VIT D DEFICIENCY, FRACTURES): Vit D, 25-Hydroxy: 64.2 ng/mL (ref 30.0–100.0)

## 2022-06-15 ENCOUNTER — Encounter (HOSPITAL_BASED_OUTPATIENT_CLINIC_OR_DEPARTMENT_OTHER): Payer: Self-pay | Admitting: Cardiology

## 2022-06-15 ENCOUNTER — Ambulatory Visit (HOSPITAL_BASED_OUTPATIENT_CLINIC_OR_DEPARTMENT_OTHER): Payer: Medicare PPO | Admitting: Cardiology

## 2022-06-15 VITALS — BP 114/66 | HR 75 | Ht 63.0 in | Wt 198.8 lb

## 2022-06-15 DIAGNOSIS — E1169 Type 2 diabetes mellitus with other specified complication: Secondary | ICD-10-CM

## 2022-06-15 DIAGNOSIS — I2510 Atherosclerotic heart disease of native coronary artery without angina pectoris: Secondary | ICD-10-CM

## 2022-06-15 DIAGNOSIS — Z794 Long term (current) use of insulin: Secondary | ICD-10-CM | POA: Diagnosis not present

## 2022-06-15 DIAGNOSIS — E669 Obesity, unspecified: Secondary | ICD-10-CM

## 2022-06-15 DIAGNOSIS — R0789 Other chest pain: Secondary | ICD-10-CM | POA: Diagnosis not present

## 2022-06-15 DIAGNOSIS — I10 Essential (primary) hypertension: Secondary | ICD-10-CM

## 2022-06-15 NOTE — Patient Instructions (Signed)
Medication Instructions:  Continue current medications  *If you need a refill on your cardiac medications before your next appointment, please call your pharmacy*   Lab Work: None Ordered   Testing/Procedures: None Ordered   Follow-Up: At [facility], you and your health needs are our priority.  As part of our continuing mission to provide you with exceptional heart care, we have created designated Provider Care Teams.  These Care Teams include your primary Cardiologist (physician) and Advanced Practice Providers (APPs -  Physician Assistants and Nurse Practitioners) who all work together to provide you with the care you need, when you need it.  We recommend signing up for the patient portal called "MyChart".  Sign up information is provided on this After Visit Summary.  MyChart is used to connect with patients for Virtual Visits (Telemedicine).  Patients are able to view lab/test results, encounter notes, upcoming appointments, etc.  Non-urgent messages can be sent to your provider as well.   To learn more about what you can do with MyChart, go to [email or web address].    Your next appointment:   6 month(s)  The format for your next appointment:   In Person  Provider:   Jodelle Red, MD

## 2022-06-15 NOTE — Progress Notes (Addendum)
Cardiology Office Note:    Date:  06/15/2022   ID:  Pamela Love, DOB 12/22/1950, MRN [id number]  PCP:  Mayra Neer, MD  Cardiologist:  Buford Dresser, MD  Referring MD: Mayra Neer, MD   CC: Follow-up  History of Present Illness:    Pamela Love is a 71 y.o. female with a hx of arthritis, CAD, CKD stage IIIa, diabetes type 2,  hyperlipidemia, hypertension, obesity, and OSA on CPAP who is seen for follow-up. She was previously a patient of Dr. Harriet Masson.     Cardiovascular risk factors: Prior clinical ASCVD: CAD. From May 11-June 27th 2022 she had 5 "anginal attacks". Since her stent placement in 01/2021 her symptoms are much reduced. Comorbid conditions: CKD stage IIIa, diabetes type 2, hyperlipidemia, hypertension Metabolic syndrome/Obesity: BMI 35, working on weight loss with [facility] Exercise level: Exercises at cardiac rehab facility (past year), usually for 1 hour and then becomes fatigued. Still becomes fatigued and short of breath climbing a hill. She is participating in [facility].  Current diet: Special occasion glass of wine or frozen margarita.  At her last appointment we reviewed her ER visit from 11/13/21. Had one of her chest pain attacks, ER workup notable for normal hsTn (3>3), normal ECG. Told to try GI cocktail during one of her episodes.  Today, she reports 3 recurring episodes of GI upset on 6/23, 6/27, and 06/09/22. Her pain causes her to feel completely wiped out afterwards. These episodes are distinguishable from her typical indigestion, and don't seem to occur after eating junk foods. Her symptoms improve with lidocaine and Pepto bismol. Since her Darcel Bayley was increased last week she has needed to take Pepto bismol more frequently.  Regarding her diabetes she notes that her blood sugars may be as high as 159-162 in the mornings, and closer to 98 in the afternoons.  For exercise she usually walks on a  treadmill twice a week for one mile. However, if she walks for a mile outdoors she will feel fatigued and short of breath.  She denies any palpitations, or peripheral edema. No lightheadedness, headaches, syncope, orthopnea, or PND.    Past Medical History:  Diagnosis Date   Abnormal nuclear stress test 01/02/2020   Arthritis    Bilateral leg edema 01/02/2020   Bursitis    hips   C. difficile colitis 12/2017   CAD (coronary artery disease) 01/06/2020   CKD (chronic kidney disease), stage III (HCC)    Complication of anesthesia    "spinal did not work with second c section"   Diabetes mellitus ([place]) 11/19/2015   Diabetes mellitus, type 2 ([place])    diet controlled - has Rx for Glymipride but has not started taking yet   Dyslipidemia 10/21/2014   GERD (gastroesophageal reflux disease)    History of nonmelanoma skin cancer    History of skin cancer    Hypercholesterolemia 12/11/2019   Hyperlipidemia    Hypertension    Hypertensive disorder 10/21/2014   Lower extremity edema    Mixed hyperlipidemia 01/02/2020   OAB (overactive bladder)    Obesity (BMI 30-39.9) 05/12/2020   OSA (obstructive sleep apnea) 05/12/2020   Osteoarthritis of right knee 05/15/2015   Osteoarthrosis, unspecified whether generalized or localized, involving lower leg 10/21/2014   Osteoporosis    Over weight    Primary osteoarthritis of left knee 12/18/2014   S/P knee replacement 12/18/2014   S/P total knee replacement using cement 05/15/2015   Sleep apnea  Tachycardia 12/11/2019    Past Surgical History:  Procedure Laterality Date   ABDOMINAL HYSTERECTOMY  2005   CATARACT EXTRACTION     CESAREAN SECTION     x2   colonscopy      CORONARY STENT INTERVENTION N/A 01/06/2020   Procedure: CORONARY STENT INTERVENTION;  Surgeon: Lorretta Harp, MD;  Location: [facility];  Service: Cardiovascular;  Laterality: N/A;   EYE SURGERY     bilat cataract surgery 2015   INTRAVASCULAR PRESSURE WIRE/FFR STUDY N/A  01/06/2020   Procedure: INTRAVASCULAR PRESSURE WIRE/FFR STUDY;  Surgeon: Lorretta Harp, MD;  Location: [facility];  Service: Cardiovascular;  Laterality: N/A;   LEFT HEART CATH AND CORONARY ANGIOGRAPHY N/A 01/06/2020   Procedure: LEFT HEART CATH AND CORONARY ANGIOGRAPHY;  Surgeon: Lorretta Harp, MD;  Location: [facility];  Service: Cardiovascular;  Laterality: N/A;   LEFT HEART CATH AND CORONARY ANGIOGRAPHY N/A 01/29/2021   Procedure: LEFT HEART CATH AND CORONARY ANGIOGRAPHY;  Surgeon: Jettie Booze, MD;  Location: [facility];  Service: Cardiovascular;  Laterality: N/A;   TOTAL KNEE ARTHROPLASTY Left 12/18/2014   Procedure: TOTAL LEFT KNEE ARTHROPLASTY;  Surgeon: Sydnee Cabal, MD;  Location: [facility];  Service: Orthopedics;  Laterality: Left;   TOTAL KNEE ARTHROPLASTY Right 05/15/2015   Procedure: RIGHT TOTAL KNEE ARTHROPLASTY;  Surgeon: Sydnee Cabal, MD;  Location: [facility];  Service: Orthopedics;  Laterality: Right;   UTERINE FIBROID SURGERY      Current Medications: Current Outpatient Medications on File Prior to Visit  Medication Sig   Accu-Chek Softclix Lancets lancets USE TO CHECK BLOOD SUGAR TWICE DAILY   acetaminophen (TYLENOL) 650 MG CR tablet Take 1,300 mg by mouth at bedtime.   alum & mag hydroxide-simeth (MAALOX MAX) 400-400-40 MG/5ML suspension Take 15 mLs by mouth every 6 (six) hours as needed for indigestion.   aspirin EC 81 MG tablet Take 1 tablet (81 mg total) by mouth in the morning.   Blood Glucose Monitoring Suppl (ACCU-CHEK GUIDE ME) w/Device KIT 1 each by Does not apply route 2 (two) times daily. E11.9   carvedilol (COREG) 3.125 MG tablet Take 1 tablet (3.125 mg total) by mouth 2 (two) times daily with a meal.   Cholecalciferol (VITAMIN D) 2000 UNITS CAPS Take 2,000 Units by mouth every morning.    clopidogrel (PLAVIX) 75 MG tablet Take 1 tablet (75 mg total) by mouth daily with breakfast.   Coenzyme Q10 (COQ10) 200 MG CAPS Take 200 mg by  mouth in the morning.   DULoxetine (CYMBALTA) 20 MG capsule Take 40 mg by mouth at bedtime.    furosemide (LASIX) 40 MG tablet TAKE 1 TABLET (40 MG TOTAL) BY MOUTH 3 (THREE) TIMES A WEEK. TUESDAYS, THURSDAYS & SATURDAYS IN THE MORNING   glucose blood (ACCU-CHEK GUIDE) test strip Use to check BS 2x a day   insulin glargine, 2 Unit Dial, (TOUJEO MAX SOLOSTAR) 300 UNIT/ML Solostar Pen Inject 18 Units into the skin in the morning. And 4 mm pen needles 2/day (Patient taking differently: Inject 16 Units into the skin in the morning. And 4 mm pen needles 2/day)   Insulin Pen Needle (BD PEN NEEDLE NANO U/F) 32G X 4 MM MISC 1 each by Does not apply route daily.   KLOR-CON M20 20 MEQ tablet TAKE 1 TABLET (20 MEQ TOTAL) BY MOUTH 3 (THREE) TIMES A WEEK. TUESDAYS, THURSDAYS & SATURDAYS IN THE MORNING   lidocaine (XYLOCAINE) 2 % solution Use as  directed 15 mLs in the mouth or throat as needed for mouth pain.   Multiple Vitamin (MULTIVITAMIN WITH MINERALS) TABS tablet Take 1 tablet by mouth every morning.    Omega-3 Fatty Acids (RA FISH OIL) 1400 MG CPDR Take 1,400 mg by mouth 2 (two) times daily.   pantoprazole (PROTONIX) 40 MG tablet Take 40 mg by mouth daily.   ranolazine (RANEXA) 1000 MG SR tablet Take 1 tablet (1,000 mg total) by mouth 2 (two) times daily.   rosuvastatin (CRESTOR) 40 MG tablet Take 1 tablet (40 mg total) by mouth in the morning.   saccharomyces boulardii (FLORASTOR) 250 MG capsule Take 250 mg by mouth in the morning.   tirzepatide (MOUNJARO) 12.5 MG/0.5ML Pen Inject 12.5 mg into the skin once a week. Q thurs   trospium (SANCTURA) 20 MG tablet Take 20 mg by mouth 2 (two) times daily.   vitamin C (ASCORBIC ACID) 250 MG tablet Take 250 mg by mouth in the morning.   nitroGLYCERIN (NITROSTAT) 0.4 MG SL tablet Place 1 tablet (0.4 mg total) under the tongue every 5 (five) minutes x 3 doses as needed for chest pain.   No current facility-administered medications on file prior to visit.      Allergies:   Amoxicillin-pot clavulanate, Aspirin, Colchicine, Diclofenac, Dulaglutide, Empagliflozin, Ezetimibe, Imdur [isosorbide nitrate], Isosorbide, Metformin, Repaglinide, Sitagliptin-metformin hcl, Sulfamethoxazole-trimethoprim, Imipramine, and Tape   Social History   Tobacco Use   Smoking status: Never   Smokeless tobacco: Never  Vaping Use   Vaping Use: Never used  Substance Use Topics   Alcohol use: Yes    Comment: rare   Drug use: No    Family History: family history includes Cancer in her mother; Congenital heart disease in her son; Diabetes in her maternal grandfather; Heart attack in her father, maternal grandfather, paternal uncle, and sister; Heart disease in her father and mother; Hyperlipidemia in her father and mother; Hypertension in her father and mother; Sleep apnea in her father; Sudden death in her father.  ROS:   Please see the history of present illness. (+) GI upset (+) Fatigue (+) Exertional shortness of breath All other systems are reviewed and negative.    EKGs/Labs/Other Studies Reviewed:    The following studies were reviewed today:  LHC 01/29/2021: Prox LAD to Mid LAD lesion is 40-50% stenosed. Angiographic appeareance improved compared to prior cath in 2021 when DFR was negative. Patent RCA stents. The left ventricular systolic function is normal. LV end diastolic pressure is normal. The left ventricular ejection fraction is 55-65% by visual estimate. There is no aortic valve stenosis.   Continue medical therapy.  Echo 01/07/2021:  1. Left ventricular ejection fraction, by estimation, is 60 to 65%. The  left ventricle has normal function. The left ventricle has no regional  wall motion abnormalities. Left ventricular diastolic parameters are  consistent with Grade I diastolic  dysfunction (impaired relaxation).   2. Right ventricular systolic function is normal. The right ventricular  size is normal. There is normal pulmonary artery  systolic pressure.   3. The mitral valve is normal in structure. Mild mitral valve  regurgitation. No evidence of mitral stenosis.   4. The aortic valve is normal in structure. There is mild calcification  of the aortic valve. There is mild thickening of the aortic valve. Aortic  valve regurgitation is not visualized. No aortic stenosis is present.   5. The inferior vena cava is normal in size with greater than 50%  respiratory variability, suggesting right  atrial pressure of 3 mmHg.   Lexiscan Myoview 01/01/2020: The left ventricular ejection fraction is normal (55-65%). Nuclear stress EF: 62%. There was no ST segment deviation noted during stress. Defect 1: There is a small defect of mild severity present in the basal anterior and mid anterior location. Findings consistent with ischemia. This is an intermediate risk study. Moderate area of mild ischemia involving LAD teritory. Normal LVEF.  EKG:  EKG is personally reviewed.  06/15/2022:  NSR, PRWP at 75 bpm 11/13/2021 (ED):  Normal sinus rhythm at 78 bpm. Left anterior fascicular block.  09/10/2021: NSR, PRWP, 81 bpm 03/10/2021: not ordered  Recent Labs: 11/13/2021: BUN 25; Creatinine, Ser 1.09; Hemoglobin 14.0; Platelets 243; Potassium 3.9; Sodium 137   Recent Lipid Panel    Component Value Date/Time   CHOL 134 12/16/2020 0957   TRIG 142 12/16/2020 0957   HDL 41 12/16/2020 0957   CHOLHDL 3.3 12/16/2020 0957   LDLCALC 68 12/16/2020 0957    Physical Exam:    VS:  BP 114/66 (BP Location: Left Arm, Patient Position: Sitting, Cuff Size: Large)   Pulse 75   Ht _0  (1.6 m)   Wt 198 lb 12.8 oz (90.2 kg)   BMI 35.22 kg/m     Wt Readings from Last 3 Encounters:  06/15/22 198 lb 12.8 oz (90.2 kg)  06/06/22 199 lb 12.8 oz (90.6 kg)  05/23/22 197 lb (89.4 kg)    GEN: Well nourished, well developed in no acute distress HEENT: Normal, moist mucous membranes NECK: No JVD CARDIAC: regular rhythm, normal S1 and S2, no rubs or  gallops. 1/6 systolic murmur. VASCULAR: Radial and DP pulses 2+ bilaterally. No carotid bruits RESPIRATORY:  Clear to auscultation without rales, wheezing or rhonchi  ABDOMEN: Soft, non-tender, non-distended MUSCULOSKELETAL:  Ambulates independently SKIN: Warm and dry, no edema NEUROLOGIC:  Alert and oriented x 3. No focal neuro deficits noted. PSYCHIATRIC:  Normal affect     ASSESSMENT:    1. Coronary artery disease involving native coronary artery of native heart without angina pectoris   2. Essential hypertension   3. Type 2 diabetes mellitus with other specified complication, with long-term current use of insulin (HCC)   4. Obesity, Class II, BMI 35-39.9   5. Other chest pain     PLAN:    CAD, with prior PCI Mixed hyperlipidemia Intermittent chest pain, unlikely to be angina based on workup -overall, based on the fact that her pain responds well to oral lidocaine and GI meds, suspect this is GI in origin. Notes long history of GERD, though these episodes are more intense/painful. She plans to discuss with her PCP and GI doctors -Aspirin 81 mg -statin: continue rosuvastatin 40 mg daily -LDL goal <70, LDL 68 -> 77. Wants to work on lifestyle -antianginals: beta blocker (carvedilol), ranolazine. Not on isosorbide as it made her hypotensive -Given type II diabetes and CAD, she is on GLP1RA. Tolerates mounjaro better than ozempic -has prescription for sublingual nitroglycerin, counseled on lifespan of this medication -counseled on diet recommendations and AHA exercise guidelines, below -risk factor modification, as below -counseled on red flag warning signs that need immediate medical attention   Hypertension:  -atgoal of <130/80, continue current meds. Working on weight loss  OSA: -followed by Dr. Claiborne Billings ADDENDED to add: she would benefit from CPAP therapy for her OSA  Obesity, BMI 35 Cardiac risk counseling and prevention recommendations: -recommend heart  healthy/Mediterranean diet, with whole grains, fruits, vegetable, fish, lean meats, nuts, and olive oil.  Limit salt. -recommend moderate walking, 3-5 times/week for 30-50 minutes each session. Aim for at least 150 minutes.week. Goal should be pace of 3 miles/hours, or walking 1.5 miles in 30 minutes -recommend avoidance of tobacco products. Avoid excess alcohol.  Plan for follow up: 6 months or sooner as needed.  Buford Dresser, MD, PhD, [facility]    Medication Adjustments/Labs and Tests Ordered: Current medicines are reviewed at length with the patient today.  Concerns regarding medicines are outlined above.   Orders Placed This Encounter  Procedures   EKG 12-Lead   No orders of the defined types were placed in this encounter.  Patient Instructions  Medication Instructions:  Continue current medications  *If you need a refill on your cardiac medications before your next appointment, please call your pharmacy*   Lab Work: None Ordered   Testing/Procedures: None Ordered   Follow-Up: At [facility], you and your health needs are our priority.  As part of our continuing mission to provide you with exceptional heart care, we have created designated Provider Care Teams.  These Care Teams include your primary Cardiologist (physician) and Advanced Practice Providers (APPs -  Physician Assistants and Nurse Practitioners) who all work together to provide you with the care you need, when you need it.  We recommend signing up for the patient portal called "MyChart".  Sign up information is provided on this After Visit Summary.  MyChart is used to connect with patients for Virtual Visits (Telemedicine).  Patients are able to view lab/test results, encounter notes, upcoming appointments, etc.  Non-urgent messages can be sent to your provider as well.   To learn more about what you can do with MyChart, go to [email or web address].    Your next  appointment:   6 month(s)  The format for your next appointment:   In Person  Provider:   Buford Dresser, MD               [facility],acting as a scribe for Buford Dresser, MD.,have documented all relevant documentation on the behalf of Buford Dresser, MD,as directed by  Buford Dresser, MD while in the presence of Buford Dresser, MD.  I, Buford Dresser, MD, have reviewed all documentation for this visit. The documentation on 06/15/22 for the exam, diagnosis, procedures, and orders are all accurate and complete.   Signed, Buford Dresser, MD PhD 06/15/2022     [facility]

## 2022-06-16 NOTE — Progress Notes (Signed)
Chief Complaint:   OBESITY Pamela Love is here to discuss her progress with her obesity treatment plan along with follow-up of her obesity related diagnoses. Pamela Love is on the Category 2 Plan with breakfast and lunch options and journal intake and states she is following her eating plan approximately 50% of the time. Pamela Love states she is dancing and water aerobics 180 minutes 2-3 times per week.  Today's visit was #: 47 Starting weight: 227 lbs Starting date: 03/17/2020 Today's weight: 199 lbs Today's date: 06/06/2022 Total lbs lost to date: 28 Total lbs lost since last in-office visit: +2  Interim History: Pamela Love hasn't been on plan much lately. She is not checking her blood sugars, but she has been good and only gained 2 pounds.  Subjective:   1. Type 2 diabetes mellitus with other circulatory complication, with long-term current use of insulin (HCC) Pt is still on Mounjaro 10 mg. She has not started the 12.5 mg yet. She has had no change in insulin dose and is not checking blood sugars. On 02/25/2022, pt's A1c was 6.1.  2. Vit D def with Osteopenia Pt is on OTC Vit D 2000 IU daily. Her level was last checked 2 years ago and was 63.9.  Assessment/Plan:   Orders Placed This Encounter  Procedures   VITAMIN D 25 Hydroxy (Vit-D Deficiency, Fractures)   Hemoglobin A1c    There are no discontinued medications.   No orders of the defined types were placed in this encounter.    1. Type 2 diabetes mellitus with other circulatory complication, with long-term current use of insulin (HCC) Good blood sugar control is important to decrease the likelihood of diabetic complications such as nephropathy, neuropathy, limb loss, blindness, coronary artery disease, and death. Intensive lifestyle modification including diet, exercise and weight loss are the first line of treatment for diabetes.  Continue Mounjaro and insulin per endocrinology.  Continue weight loss via prudent nutritional  plan. Continue exercise.  Lab/Orders today or future: - Hemoglobin A1c  2. Vit D def with Osteopenia Pamela Love had osteopenic findings on bone density scan 01/25/2022. Increase weight bearing exercises.  Lab/Orders today or future: - VITAMIN D 25 Hydroxy (Vit-D Deficiency, Fractures)  3. Obesity, Current BMI 35.4 Pamela Love is currently in the action stage of change. As such, her goal is to continue with weight loss efforts. She has agreed to the Category 2 Plan with breakfast and lunch options and journal intake.   Pamela Love is having a cardiology appt and annual CPE appt in the near future. She will have labs at that time and only wants done today what they typically don't draw.  Exercise goals:  As is  Behavioral modification strategies: holiday eating strategies  and avoiding temptations.  Pamela Love has agreed to follow-up with our clinic in 2-3 weeks. She was informed of the importance of frequent follow-up visits to maximize her success with intensive lifestyle modifications for her multiple health conditions.   Pamela Love was informed we would discuss her lab results at her next visit unless there is a critical issue that needs to be addressed sooner. Pamela Love agreed to keep her next visit at the agreed upon time to discuss these results.  Objective:   Blood pressure 122/74, pulse (!) 7, temperature 97.8 F (36.6 C), height 5\' 3"  (1.6 m), weight 199 lb 12.8 oz (90.6 kg), SpO2 99 %. Body mass index is 35.39 kg/m.  General: Cooperative, alert, well developed, in no acute distress. HEENT: Conjunctivae and lids unremarkable. Cardiovascular: Regular  rhythm.  Lungs: Normal work of breathing. Neurologic: No focal deficits.   Lab Results  Component Value Date   CREATININE 1.09 (H) 11/13/2021   BUN 25 (H) 11/13/2021   NA 137 11/13/2021   K 3.9 11/13/2021   CL 100 11/13/2021   CO2 27 11/13/2021   Lab Results  Component Value Date   ALT 25 04/21/2020   AST 25 04/21/2020   ALKPHOS 104 04/21/2020    BILITOT 0.5 04/21/2020   Lab Results  Component Value Date   HGBA1C 6.0 (H) 06/06/2022   HGBA1C 6.1 (A) 02/25/2022   HGBA1C 5.8 (A) 10/14/2021   HGBA1C 6.1 (A) 07/12/2021   HGBA1C 5.8 (A) 04/12/2021   No results found for: "INSULIN" Lab Results  Component Value Date   TSH 1.71 05/30/2018   Lab Results  Component Value Date   CHOL 134 12/16/2020   HDL 41 12/16/2020   LDLCALC 68 12/16/2020   TRIG 142 12/16/2020   CHOLHDL 3.3 12/16/2020   Lab Results  Component Value Date   VD25OH 64.2 06/06/2022   VD25OH 63.9 04/21/2020   Lab Results  Component Value Date   WBC 7.5 11/13/2021   HGB 14.0 11/13/2021   HCT 41.3 11/13/2021   MCV 93.0 11/13/2021   PLT 243 11/13/2021    Attestation Statements:   Reviewed by clinician on day of visit: allergies, medications, problem list, medical history, surgical history, family history, social history, and previous encounter notes.  I, Kyung Rudd, BS, CMA, am acting as transcriptionist for [employer], DO.   I have reviewed the above documentation for accuracy and completeness, and I agree with the above. [name], D.O.  The 21st Century Cures Act was signed into law in 2016 which includes the topic of electronic health records.  This provides immediate access to information in MyChart.  This includes consultation notes, operative notes, office notes, lab results and pathology reports.  If you have any questions about what you read please let us know at your next visit so we can discuss your concerns and take corrective action if need be.  We are right here with you.

## 2022-06-19 ENCOUNTER — Encounter (INDEPENDENT_AMBULATORY_CARE_PROVIDER_SITE_OTHER): Payer: Self-pay | Admitting: Family Medicine

## 2022-06-19 ENCOUNTER — Other Ambulatory Visit (INDEPENDENT_AMBULATORY_CARE_PROVIDER_SITE_OTHER): Payer: Self-pay | Admitting: Family Medicine

## 2022-06-19 DIAGNOSIS — E1169 Type 2 diabetes mellitus with other specified complication: Secondary | ICD-10-CM

## 2022-06-21 ENCOUNTER — Encounter (INDEPENDENT_AMBULATORY_CARE_PROVIDER_SITE_OTHER): Payer: Self-pay | Admitting: Family Medicine

## 2022-06-21 ENCOUNTER — Telehealth (INDEPENDENT_AMBULATORY_CARE_PROVIDER_SITE_OTHER): Payer: Medicare PPO | Admitting: Family Medicine

## 2022-06-21 ENCOUNTER — Ambulatory Visit (INDEPENDENT_AMBULATORY_CARE_PROVIDER_SITE_OTHER): Payer: Medicare PPO | Admitting: Physician Assistant

## 2022-06-21 ENCOUNTER — Encounter (INDEPENDENT_AMBULATORY_CARE_PROVIDER_SITE_OTHER): Payer: Self-pay

## 2022-06-21 VITALS — Wt 195.0 lb

## 2022-06-21 DIAGNOSIS — Z7985 Long-term (current) use of injectable non-insulin antidiabetic drugs: Secondary | ICD-10-CM

## 2022-06-21 DIAGNOSIS — Z6835 Body mass index (BMI) 35.0-35.9, adult: Secondary | ICD-10-CM | POA: Diagnosis not present

## 2022-06-21 DIAGNOSIS — E669 Obesity, unspecified: Secondary | ICD-10-CM | POA: Diagnosis not present

## 2022-06-21 DIAGNOSIS — E559 Vitamin D deficiency, unspecified: Secondary | ICD-10-CM | POA: Diagnosis not present

## 2022-06-21 DIAGNOSIS — Z794 Long term (current) use of insulin: Secondary | ICD-10-CM

## 2022-06-21 DIAGNOSIS — E1169 Type 2 diabetes mellitus with other specified complication: Secondary | ICD-10-CM

## 2022-06-21 MED ORDER — TIRZEPATIDE 12.5 MG/0.5ML ~~LOC~~ SOAJ: 12.5000 mg | SUBCUTANEOUS | 0 refills | Status: DC

## 2022-07-05 NOTE — Progress Notes (Unsigned)
TeleHealth Visit:  Due to the COVID-19 pandemic, this visit was completed with telemedicine (audio/video) technology to reduce patient and provider exposure as well as to preserve personal protective equipment.   Pamela Love has verbally consented to this TeleHealth visit. The patient is located at home, the provider is located at the [facility] office. The participants in this visit include the listed provider and patient. The visit was conducted today via MyChart video.   Chief Complaint: OBESITY Pamela Love is here to discuss her progress with her obesity treatment plan along with follow-up of her obesity related diagnoses. Pamela Love is on the Category 2 Plan and states she is following her eating plan approximately 70% of the time. Pamela Love states she is doing 0 minutes 0 times per week.  Today's visit was #: 48 Starting weight: 227 lbs Starting date: 03/17/2020  Interim History: Pamela Love is doing well with her weight loss.  Her hunger is controlled, but she may not be eating everything she should.  She plans to portion control over Thanksgiving.  Subjective:   1. Type 2 diabetes mellitus with other specified complication, with long-term current use of insulin (HCC) Pamela Love has increased Mounjaro to 12.5 mg with mild GI upset.  She is on 18 units of insulin.  Her fasting blood sugars mostly range between 130-150's and her recent A1c was 6.0.  I discussed labs with the patient today.  2. Vitamin D deficiency Pamela Love is on OTC vitamin D 2000 units daily, and her last level was at goal.  No side effects were noted.  I discussed labs with the patient today.  Assessment/Plan:   1. Type 2 diabetes mellitus with other specified complication, with long-term current use of insulin (HCC) Pamela Love will continue Mounjaro 12.5 mg once weekly, and we will refill for 1 month.  - tirzepatide (MOUNJARO) 12.5 MG/0.5ML Pen; Inject 12.5 mg into the skin once a week. Q thurs  Dispense: 2 mL; Refill: 0  2.  Vitamin D deficiency Pamela Love will continue OTC vitamin D 2000 units daily as is, and we will continue to follow.  3. Obesity, Current BMI 35.4 Pamela Love is currently in the action stage of change. As such, her goal is to continue with weight loss efforts. She has agreed to the Category 2 Plan.   Pamela Love is to make sure to meet her nutritional goals to help prevent a decrease in her RMR due to decreased nutrition.  Behavioral modification strategies: holiday eating strategies .  Pamela Love has agreed to follow-up with our clinic in 3 to 4 weeks. She was informed of the importance of frequent follow-up visits to maximize her success with intensive lifestyle modifications for her multiple health conditions.  Objective:   VITALS: Per patient if applicable, see vitals. GENERAL: Alert and in no acute distress. CARDIOPULMONARY: No increased WOB. Speaking in clear sentences.  PSYCH: Pleasant and cooperative. Speech normal rate and rhythm. Affect is appropriate. Insight and judgement are appropriate. Attention is focused, linear, and appropriate.  NEURO: Oriented as arrived to appointment on time with no prompting.   Lab Results  Component Value Date   CREATININE 1.09 (H) 11/13/2021   BUN 25 (H) 11/13/2021   NA 137 11/13/2021   K 3.9 11/13/2021   CL 100 11/13/2021   CO2 27 11/13/2021   Lab Results  Component Value Date   ALT 25 04/21/2020   AST 25 04/21/2020   ALKPHOS 104 04/21/2020   BILITOT 0.5 04/21/2020   Lab Results  Component Value Date  HGBA1C 6.0 (H) 06/06/2022   HGBA1C 6.1 (A) 02/25/2022   HGBA1C 5.8 (A) 10/14/2021   HGBA1C 6.1 (A) 07/12/2021   HGBA1C 5.8 (A) 04/12/2021   No results found for: "INSULIN" Lab Results  Component Value Date   TSH 1.71 05/30/2018   Lab Results  Component Value Date   CHOL 134 12/16/2020   HDL 41 12/16/2020   LDLCALC 68 12/16/2020   TRIG 142 12/16/2020   CHOLHDL 3.3 12/16/2020   Lab Results  Component Value Date   VD25OH 64.2 06/06/2022    VD25OH 63.9 04/21/2020   Lab Results  Component Value Date   WBC 7.5 11/13/2021   HGB 14.0 11/13/2021   HCT 41.3 11/13/2021   MCV 93.0 11/13/2021   PLT 243 11/13/2021   No results found for: "IRON", "TIBC", "FERRITIN"  Attestation Statements:   Reviewed by clinician on day of visit: allergies, medications, problem list, medical history, surgical history, family history, social history, and previous encounter notes.   I, Burt Knack, am acting as transcriptionist for Quillian Quince, MD.  I have reviewed the above documentation for accuracy and completeness, and I agree with the above. - Quillian Quince, MD

## 2022-07-07 ENCOUNTER — Ambulatory Visit (INDEPENDENT_AMBULATORY_CARE_PROVIDER_SITE_OTHER): Payer: Medicare PPO | Admitting: Family Medicine

## 2022-07-07 VITALS — BP 121/73 | HR 80 | Temp 98.0°F | Ht 63.0 in | Wt 188.0 lb

## 2022-07-07 DIAGNOSIS — E669 Obesity, unspecified: Secondary | ICD-10-CM | POA: Diagnosis not present

## 2022-07-07 DIAGNOSIS — Z7985 Long-term (current) use of injectable non-insulin antidiabetic drugs: Secondary | ICD-10-CM | POA: Diagnosis not present

## 2022-07-07 DIAGNOSIS — Z6833 Body mass index (BMI) 33.0-33.9, adult: Secondary | ICD-10-CM | POA: Diagnosis not present

## 2022-07-07 DIAGNOSIS — E1169 Type 2 diabetes mellitus with other specified complication: Secondary | ICD-10-CM

## 2022-07-07 DIAGNOSIS — Z794 Long term (current) use of insulin: Secondary | ICD-10-CM | POA: Diagnosis not present

## 2022-07-07 MED ORDER — TIRZEPATIDE 12.5 MG/0.5ML ~~LOC~~ SOAJ
12.5000 mg | SUBCUTANEOUS | 0 refills | Status: DC
  Filled 2022-07-18: qty 2, 28d supply, fill #0
  Filled 2022-08-22: qty 2, 28d supply, fill #1
  Filled 2022-09-14: qty 2, 28d supply, fill #2

## 2022-07-12 NOTE — Progress Notes (Signed)
Chief Complaint:   OBESITY Pamela Love is here to discuss her progress with her obesity treatment plan along with follow-up of her obesity related diagnoses. Pamela Love is on the Category 2 Plan. Pamela Love states she is doing cardiac and rehab dancing 45-90 minutes 1-2. times per week.  Today's visit was #: 49 Starting weight: 227 LBS Starting date: 03/17/2020 Today's weight: 188 LBS Today's date: 07/07/2022 Total lbs lost to date: 39 LBS Total lbs lost since last in-office visit: 7 LBS  Interim History: Patient started Mounjaro at 12.5 mg on 05/23/2022, her dose increased from 10 mg.  She is doing fantastic, she really has to focus on protein intake and has decreased her carbohydrate intake.  Patient has lost 10.4 LBS of fat mass.  Subjective:   1. Type 2 diabetes mellitus with obesity (HCC) Patient is taking Toujeo max and Mounjaro.  Last A1c was 6.0 on 06/06/2022 fasting blood sugars, 145, 161, 119, the lowest was 101.  Assessment/Plan:  No orders of the defined types were placed in this encounter.   Medications Discontinued During This Encounter  Medication Reason   tirzepatide [facility]) 12.5 MG/0.5ML Pen Reorder     Meds ordered this encounter  Medications   tirzepatide (MOUNJARO) 12.5 MG/0.5ML Pen    Sig: Inject 12.5 mg into the skin once a week on Thursday.    Dispense:  6 mL    Refill:  0     1. Type 2 diabetes mellitus with obesity (HCC) Refill- tirzepatide (MOUNJARO) 12.5 MG/0.5ML Pen; Inject 12.5 mg into the skin once a week. Q Thurs  Dispense: 6 mL; Refill: 0  2. Obesity, current BMI 33.4 Pamela Love is currently in the action stage of change. As such, her goal is to continue with weight loss efforts. She has agreed to the Category 2 Plan with breakfast and lunch options.  Exercise goals:  As is, but increase as tolerated.  Behavioral modification strategies: holiday eating strategies  and celebration eating strategies.  Pamela Love has agreed to follow-up with our clinic in 3  weeks. She was informed of the importance of frequent follow-up visits to maximize her success with intensive lifestyle modifications for her multiple health conditions.   Objective:   Blood pressure 121/73, pulse 80, temperature 98 F (36.7 C), height 5\' 3"  (1.6 m), weight 188 lb (85.3 kg), SpO2 100 %. Body mass index is 33.3 kg/m.  General: Cooperative, alert, well developed, in no acute distress. HEENT: Conjunctivae and lids unremarkable. Cardiovascular: Regular rhythm.  Lungs: Normal work of breathing. Neurologic: No focal deficits.   Lab Results  Component Value Date   CREATININE 1.09 (H) 11/13/2021   BUN 25 (H) 11/13/2021   NA 137 11/13/2021   K 3.9 11/13/2021   CL 100 11/13/2021   CO2 27 11/13/2021   Lab Results  Component Value Date   ALT 25 04/21/2020   AST 25 04/21/2020   ALKPHOS 104 04/21/2020   BILITOT 0.5 04/21/2020   Lab Results  Component Value Date   HGBA1C 6.0 (H) 06/06/2022   HGBA1C 6.1 (A) 02/25/2022   HGBA1C 5.8 (A) 10/14/2021   HGBA1C 6.1 (A) 07/12/2021   HGBA1C 5.8 (A) 04/12/2021   No results found for: "INSULIN" Lab Results  Component Value Date   TSH 1.71 05/30/2018   Lab Results  Component Value Date   CHOL 134 12/16/2020   HDL 41 12/16/2020   LDLCALC 68 12/16/2020   TRIG 142 12/16/2020   CHOLHDL 3.3 12/16/2020   Lab Results  Component Value Date   VD25OH 64.2 06/06/2022   VD25OH 63.9 04/21/2020   Lab Results  Component Value Date   WBC 7.5 11/13/2021   HGB 14.0 11/13/2021   HCT 41.3 11/13/2021   MCV 93.0 11/13/2021   PLT 243 11/13/2021   No results found for: "IRON", "TIBC", "FERRITIN"  Attestation Statements:   Reviewed by clinician on day of visit: allergies, medications, problem list, medical history, surgical history, family history, social history, and previous encounter notes.  I, Malcolm Metro, am acting as Energy manager for Seymour Bars, DO.  I have reviewed the above documentation for accuracy and  completeness, and I agree with the above. [name], D.O.  The 21st Century Cures Act was signed into law in 2016 which includes the topic of electronic health records.  This provides immediate access to information in MyChart.  This includes consultation notes, operative notes, office notes, lab results and pathology reports.  If you have any questions about what you read please let us know at your next visit so we can discuss your concerns and take corrective action if need be.  We are right here with you.

## 2022-07-17 ENCOUNTER — Encounter (INDEPENDENT_AMBULATORY_CARE_PROVIDER_SITE_OTHER): Payer: Self-pay | Admitting: Family Medicine

## 2022-07-18 ENCOUNTER — Other Ambulatory Visit (HOSPITAL_BASED_OUTPATIENT_CLINIC_OR_DEPARTMENT_OTHER): Payer: Self-pay

## 2022-07-18 DIAGNOSIS — I2510 Atherosclerotic heart disease of native coronary artery without angina pectoris: Secondary | ICD-10-CM | POA: Diagnosis not present

## 2022-07-18 DIAGNOSIS — E1122 Type 2 diabetes mellitus with diabetic chronic kidney disease: Secondary | ICD-10-CM | POA: Diagnosis not present

## 2022-07-18 DIAGNOSIS — G4733 Obstructive sleep apnea (adult) (pediatric): Secondary | ICD-10-CM | POA: Diagnosis not present

## 2022-07-18 DIAGNOSIS — I209 Angina pectoris, unspecified: Secondary | ICD-10-CM | POA: Diagnosis not present

## 2022-07-18 DIAGNOSIS — I129 Hypertensive chronic kidney disease with stage 1 through stage 4 chronic kidney disease, or unspecified chronic kidney disease: Secondary | ICD-10-CM | POA: Diagnosis not present

## 2022-07-18 DIAGNOSIS — E782 Mixed hyperlipidemia: Secondary | ICD-10-CM | POA: Diagnosis not present

## 2022-07-18 DIAGNOSIS — Z0000 Encounter for general adult medical examination without abnormal findings: Secondary | ICD-10-CM | POA: Diagnosis not present

## 2022-07-18 DIAGNOSIS — M1990 Unspecified osteoarthritis, unspecified site: Secondary | ICD-10-CM | POA: Diagnosis not present

## 2022-07-18 DIAGNOSIS — N3281 Overactive bladder: Secondary | ICD-10-CM | POA: Diagnosis not present

## 2022-07-18 LAB — PROTEIN / CREATININE RATIO, URINE: Creatinine, Urine: 81

## 2022-07-18 LAB — LIPID PANEL
Cholesterol: 129 (ref 0–200)
HDL: 80 — AB (ref 35–70)
LDL Cholesterol: 60
Triglycerides: 109 (ref 40–160)

## 2022-07-18 LAB — COMPREHENSIVE METABOLIC PANEL
Albumin: 4.6 (ref 3.5–5.0)
Calcium: 10.1 (ref 8.7–10.7)
eGFR: 57

## 2022-07-18 LAB — BASIC METABOLIC PANEL
BUN: 39 — AB (ref 4–21)
CO2: 28 — AB (ref 13–22)
Chloride: 106 (ref 99–108)
Creatinine: 1 (ref 0.5–1.1)
Glucose: 100
Potassium: 3.7 mEq/L (ref 3.5–5.1)
Sodium: 142 (ref 137–147)

## 2022-07-18 LAB — HEPATIC FUNCTION PANEL
ALT: 32 U/L (ref 7–35)
AST: 25 (ref 13–35)
Alkaline Phosphatase: 92 (ref 25–125)

## 2022-07-18 LAB — MICROALBUMIN, URINE: Microalb, Ur: 2.25

## 2022-07-21 ENCOUNTER — Encounter (INDEPENDENT_AMBULATORY_CARE_PROVIDER_SITE_OTHER): Payer: Self-pay | Admitting: Physician Assistant

## 2022-07-21 ENCOUNTER — Ambulatory Visit (INDEPENDENT_AMBULATORY_CARE_PROVIDER_SITE_OTHER): Payer: Medicare PPO | Admitting: Physician Assistant

## 2022-07-21 VITALS — BP 110/77 | HR 78 | Temp 98.0°F | Ht 63.0 in | Wt 185.0 lb

## 2022-07-21 DIAGNOSIS — E669 Obesity, unspecified: Secondary | ICD-10-CM | POA: Diagnosis not present

## 2022-07-21 DIAGNOSIS — E1169 Type 2 diabetes mellitus with other specified complication: Secondary | ICD-10-CM | POA: Diagnosis not present

## 2022-07-21 DIAGNOSIS — Z6832 Body mass index (BMI) 32.0-32.9, adult: Secondary | ICD-10-CM

## 2022-07-21 DIAGNOSIS — Z7985 Long-term (current) use of injectable non-insulin antidiabetic drugs: Secondary | ICD-10-CM

## 2022-07-21 DIAGNOSIS — R1013 Epigastric pain: Secondary | ICD-10-CM

## 2022-07-22 ENCOUNTER — Telehealth: Payer: Self-pay | Admitting: Cardiovascular Disease

## 2022-07-22 NOTE — Telephone Encounter (Signed)
Pt wants to transfer her cpap materials to place below because [facility] has gone out of business. She states she almost out of supplies but they need Dr. Landry Dyke approval   [facility]  [address] [place], [place] [address] [phone]

## 2022-08-03 NOTE — Telephone Encounter (Signed)
Will forward to sleep coordinator.

## 2022-08-03 NOTE — Telephone Encounter (Signed)
Patient is following up requesting an update due to not hearing back from anyone. Materials are being transferred to [facility]they will need sleep results, chart notes, and an order. Fax # is [phone] (attn: [name]).

## 2022-08-04 NOTE — Telephone Encounter (Signed)
Returned a call to the patient informing her that per Ivin Booty with [facility] sent transfer and records to [facility] on 07/21/22. Fax confirmation was received. I will contact [name] to inquire about what she still is needing. Patient was also notified that she will need a appointment to be seen by [name]. She was advised to call now to make appointment due to appointments booking out and filling up.

## 2022-08-10 NOTE — Progress Notes (Signed)
Chief Complaint:   OBESITY Pamela Love is here to discuss her progress with her obesity treatment plan along with follow-up of her obesity related diagnoses. Pamela Love is on the Category 2 Plan and states she is following her eating plan approximately 70% of the time. Pamela Love states she is usung the treadmill/dancing 20-60 minutes 2/1 times per week.  Today's visit was #: 70 Starting weight: 227 lbs Starting date: 03/17/2020 Today's weight: 185 lbs Today's date: 07/21/2022 Total lbs lost to date: 42 lbs Total lbs lost since last in-office visit: 3  Interim History: Pamela Love has done well with weight loss.  Meeting protein needs and feels her appetite is appropriate.  Not skipping meals.  Taking Mounjaro 12.5 mg weekly with no side effects except some mild reflux.  No nausea vomiting, no neck mass or difficulty swallowing.    Bioimepdance scale: muscle mass decreased to 0.8lb,  adipose mass decreased to 2.8lb,  body water decreased to 1lb.  Subjective:   1. Type 2 diabetes mellitus with obesity ([place]) Pamela Love is taking Mounjaro 12.5 mg weekly--some mild reflux/esophageal spasm. PCP added evening Pepcid to usual Protonix. No N/V/C/D neck mass or problem swallowing. Blood sugars are 90-140s. Denies hypoglycemia. Toujeo down to 18 units daily and she continues to follow regularly with endocrinology Dr. Cruzita Lederer.  2. Dyspepsia Pamela Love notes reflux/esophageal spasm. Saw cardiology to rule out cardiac source--and cardiology felt it was of doubtful cardiac origin cardiac origin as her symptoms improved with lidocaine and Peptobismol.  Her PCP has added Pepcid in evenings. She reports GI symptoms have resolved resolved.  Assessment/Plan:   1. Type 2 diabetes mellitus with obesity (HCC) Continue Mounjaro 12.5 mg weekly and insulin glargine 18 units daily as directed.  Continue prescribed nutrition plan to decrease simple carbohydrates, increase lean proteins and exercise to promote weight loss. 2.  Dyspepsia Continue Protonix and Pepcid and will monitor closely with PCP.   3. Obesity, current BMI 32.8 Pamela Love is currently in the action stage of change. As such, her goal is to continue with weight loss efforts. She has agreed to the Category 2 Plan.   Exercise goals: As is.  Behavioral modification strategies: increasing lean protein intake, decreasing simple carbohydrates, and holiday eating strategies .  Pamela Love has agreed to follow-up with our clinic in 2 weeks. She was informed of the importance of frequent follow-up visits to maximize her success with intensive lifestyle modifications for her multiple health conditions.   Objective:   Blood pressure 110/77, pulse 78, temperature 98 F (36.7 C), height 5\' 3"  (1.6 m), weight 185 lb (83.9 kg), SpO2 100 %. Body mass index is 32.77 kg/m.  General: Cooperative, alert, well developed, in no acute distress. HEENT: Conjunctivae and lids unremarkable. Cardiovascular: Regular rhythm.  Lungs: Normal work of breathing. Neurologic: No focal deficits.   Lab Results  Component Value Date   CREATININE 1.09 (H) 11/13/2021   BUN 25 (H) 11/13/2021   NA 137 11/13/2021   K 3.9 11/13/2021   CL 100 11/13/2021   CO2 27 11/13/2021   Lab Results  Component Value Date   ALT 25 04/21/2020   AST 25 04/21/2020   ALKPHOS 104 04/21/2020   BILITOT 0.5 04/21/2020   Lab Results  Component Value Date   HGBA1C 6.0 (H) 06/06/2022   HGBA1C 6.1 (A) 02/25/2022   HGBA1C 5.8 (A) 10/14/2021   HGBA1C 6.1 (A) 07/12/2021   HGBA1C 5.8 (A) 04/12/2021   No results found for: "INSULIN" Lab Results  Component Value Date  TSH 1.71 05/30/2018   Lab Results  Component Value Date   CHOL 134 12/16/2020   HDL 41 12/16/2020   LDLCALC 68 12/16/2020   TRIG 142 12/16/2020   CHOLHDL 3.3 12/16/2020   Lab Results  Component Value Date   VD25OH 64.2 06/06/2022   VD25OH 63.9 04/21/2020   Lab Results  Component Value Date   WBC 7.5 11/13/2021   HGB 14.0  11/13/2021   HCT 41.3 11/13/2021   MCV 93.0 11/13/2021   PLT 243 11/13/2021   No results found for: "IRON", "TIBC", "FERRITIN"  Attestation Statements:   Reviewed by clinician on day of visit: allergies, medications, problem list, medical history, surgical history, family history, social history, and previous encounter notes.  I, Brendell Tyus, am acting as transcriptionist for [employer], PA.  I have reviewed the above documentation for accuracy and completeness, and I agree with the above. -  Riely Oetken,PA-C

## 2022-08-11 ENCOUNTER — Telehealth: Payer: Self-pay | Admitting: *Deleted

## 2022-08-11 NOTE — Telephone Encounter (Addendum)
Patient called saying that [facility] says they still have not received information needed from [facility] in order to give her the CPAP supplies. I called and spoke with [name]. She tells me that she has  received the sleep study and other records from [facility], however she still needs a current supply order and face to face office note. Order to monitor CPAP and supplies was faxed to beth @ [phone], however I do not have a current office note. Patient has appointment scheduled to be seen by [name] in March. She was seen by [name] in November. I have asked [name] to addend her note. If she does then I will forward this to them. Patient was made aware.

## 2022-08-14 ENCOUNTER — Encounter: Payer: Self-pay | Admitting: Internal Medicine

## 2022-08-15 ENCOUNTER — Encounter (INDEPENDENT_AMBULATORY_CARE_PROVIDER_SITE_OTHER): Payer: Self-pay | Admitting: Family Medicine

## 2022-08-15 ENCOUNTER — Ambulatory Visit (INDEPENDENT_AMBULATORY_CARE_PROVIDER_SITE_OTHER): Payer: Medicare PPO | Admitting: Family Medicine

## 2022-08-15 ENCOUNTER — Other Ambulatory Visit: Payer: Self-pay | Admitting: Internal Medicine

## 2022-08-15 VITALS — BP 143/83 | HR 71 | Temp 97.7°F | Ht 63.0 in | Wt 186.4 lb

## 2022-08-15 DIAGNOSIS — Z7985 Long-term (current) use of injectable non-insulin antidiabetic drugs: Secondary | ICD-10-CM

## 2022-08-15 DIAGNOSIS — E669 Obesity, unspecified: Secondary | ICD-10-CM | POA: Diagnosis not present

## 2022-08-15 DIAGNOSIS — E1169 Type 2 diabetes mellitus with other specified complication: Secondary | ICD-10-CM | POA: Diagnosis not present

## 2022-08-15 DIAGNOSIS — Z6833 Body mass index (BMI) 33.0-33.9, adult: Secondary | ICD-10-CM

## 2022-08-15 DIAGNOSIS — E1122 Type 2 diabetes mellitus with diabetic chronic kidney disease: Secondary | ICD-10-CM

## 2022-08-15 MED ORDER — TOUJEO MAX SOLOSTAR 300 UNIT/ML ~~LOC~~ SOPN: 18.0000 [IU] | PEN_INJECTOR | Freq: Every day | SUBCUTANEOUS | 3 refills | Status: DC

## 2022-08-29 ENCOUNTER — Encounter (INDEPENDENT_AMBULATORY_CARE_PROVIDER_SITE_OTHER): Payer: Self-pay | Admitting: Adult Health

## 2022-08-29 ENCOUNTER — Ambulatory Visit (INDEPENDENT_AMBULATORY_CARE_PROVIDER_SITE_OTHER): Payer: Medicare PPO | Admitting: Adult Health

## 2022-08-29 VITALS — BP 116/72 | HR 84 | Temp 98.1°F | Ht 63.0 in | Wt 183.0 lb

## 2022-08-29 DIAGNOSIS — Z7985 Long-term (current) use of injectable non-insulin antidiabetic drugs: Secondary | ICD-10-CM

## 2022-08-29 DIAGNOSIS — Z6832 Body mass index (BMI) 32.0-32.9, adult: Secondary | ICD-10-CM

## 2022-08-29 DIAGNOSIS — Z794 Long term (current) use of insulin: Secondary | ICD-10-CM | POA: Diagnosis not present

## 2022-08-29 DIAGNOSIS — E1169 Type 2 diabetes mellitus with other specified complication: Secondary | ICD-10-CM

## 2022-08-29 DIAGNOSIS — E669 Obesity, unspecified: Secondary | ICD-10-CM | POA: Diagnosis not present

## 2022-08-30 ENCOUNTER — Encounter: Payer: Self-pay | Admitting: Internal Medicine

## 2022-08-30 ENCOUNTER — Ambulatory Visit: Payer: Medicare PPO | Admitting: Internal Medicine

## 2022-08-30 VITALS — BP 120/74 | HR 71 | Ht 63.0 in | Wt 186.6 lb

## 2022-08-30 DIAGNOSIS — E782 Mixed hyperlipidemia: Secondary | ICD-10-CM

## 2022-08-30 DIAGNOSIS — N1831 Chronic kidney disease, stage 3a: Secondary | ICD-10-CM

## 2022-08-30 DIAGNOSIS — E1122 Type 2 diabetes mellitus with diabetic chronic kidney disease: Secondary | ICD-10-CM

## 2022-08-30 LAB — POCT GLYCOSYLATED HEMOGLOBIN (HGB A1C): Hemoglobin A1C: 5.5 % (ref 4.0–5.6)

## 2022-08-30 NOTE — Patient Instructions (Addendum)
Please decrease: - Toujeo 14 units daily (after 1 week, if the sugars are mostly <130, you can try to decrease to 14 units)  Continue: - Mounjaro 12.5 mg weekly   Please return in 6 months with your sugar log.

## 2022-08-30 NOTE — Progress Notes (Signed)
Patient ID: Pamela Love, female   DOB: [birth year], 72 y.o.   MRN: [id number]  HPI: Pamela Love is a 72 y.o.-year-old female, returning for follow-up for DM2, dx in 2012, insulin-dependent, uncontrolled, with complications (CAD - s/p 2 stents, stage 3 CKD). Pt. previously saw Dr. Loanne Drilling, but last visit with me 6 months ago.  Interim history: No increased urination, blurry vision, nausea, chest pain. She lost 44 lbs since she started to see the [facility] 2 years ago.  Reviewed HbA1c: Lab Results  Component Value Date   HGBA1C 6.0 (H) 06/06/2022   HGBA1C 6.1 (A) 02/25/2022   HGBA1C 5.8 (A) 10/14/2021   HGBA1C 6.1 (A) 07/12/2021   HGBA1C 5.8 (A) 04/12/2021   HGBA1C 7.0 (A) 12/10/2020   HGBA1C 6.3 (A) 09/02/2020   HGBA1C 6.5 (A) 05/27/2020   HGBA1C 7.9 (H) 04/21/2020   HGBA1C 8.0 (A) 02/20/2020   Pt is on a regimen of: - Toujeo 16 >> 18 units daily - Mounjaro 7.5 >> 10 >> 12.5 mg weekly - [facility] She previously tried Victoza and Ozempic. She tried Trulicity and Ozempic >> nausea She tried [place] >> vaginal yeast infections She tried repaglinide >> diarrhea Reviewing Dr. Cordelia Pen last note, she declined multiple daily injections in the past.  Pt checks her sugars 1x a day and they are: - am: 152-170 >> 101-150, 154, 169 - 2h after b'fast: n/c - before lunch: 110 >> 88, 90, 128 - 2h after lunch: n/c - before dinner: 126 >> 109, 120 - 2h after dinner: n/c >> 93 - bedtime: 87-114 >> 74-119 - nighttime: n/c Lowest sugar was 87 >> 74; she has hypoglycemia awareness at 70.  Highest sugar was 180>> 169.  Glucometer:Accu Chek  - + CKD, last BUN/creatinine:  07/19/2022: 39/1.03, GFR 57, ACR 27.6 Lab Results  Component Value Date   BUN 25 (H) 11/13/2021   BUN 28 (H) 01/26/2021   CREATININE 1.09 (H) 11/13/2021   CREATININE 1.02 (H) 01/26/2021  She is not on ACE inhibitor/ARB.  -+ HL; last set of  lipids: 07/19/2022: 129/109/50/60 07/07/2021: 158/143/57/77 Lab Results  Component Value Date   CHOL 134 12/16/2020   HDL 41 12/16/2020   LDLCALC 68 12/16/2020   TRIG 142 12/16/2020   CHOLHDL 3.3 12/16/2020  She is on Crestor 40 mg daily.  - last eye exam was in 08/2021. No DR reportedly. Dr. Prudencio Burly. Coming up in 09/2022.  - no numbness and tingling in her feet.  Last foot exam 04/12/2021.  She also has a history of GERD, OSA, OA, osteoporosis, obesity.  She lost 28-30 lbs since 2021 - [facility]  She adapted well to the diet, despite the fact that she describes having a sweet tooth.  ROS: + see HPI  Past Medical History:  Diagnosis Date   Abnormal nuclear stress test 01/02/2020   Arthritis    Bilateral leg edema 01/02/2020   Bursitis    hips   C. difficile colitis 12/2017   CAD (coronary artery disease) 01/06/2020   CKD (chronic kidney disease), stage III (HCC)    Complication of anesthesia    "spinal did not work with second c section"   Diabetes mellitus ([place]) 11/19/2015   Diabetes mellitus, type 2 ([place])    diet controlled - has Rx for Glymipride but has not started taking yet   Dyslipidemia 10/21/2014   GERD (gastroesophageal reflux disease)    History of nonmelanoma skin  cancer    History of skin cancer    Hypercholesterolemia 12/11/2019   Hyperlipidemia    Hypertension    Hypertensive disorder 10/21/2014   Lower extremity edema    Mixed hyperlipidemia 01/02/2020   OAB (overactive bladder)    Obesity (BMI 30-39.9) 05/12/2020   OSA (obstructive sleep apnea) 05/12/2020   Osteoarthritis of right knee 05/15/2015   Osteoarthrosis, unspecified whether generalized or localized, involving lower leg 10/21/2014   Osteoporosis    Over weight    Primary osteoarthritis of left knee 12/18/2014   S/P knee replacement 12/18/2014   S/P total knee replacement using cement 05/15/2015   Sleep apnea    Tachycardia 12/11/2019   Past Surgical History:   Procedure Laterality Date   ABDOMINAL HYSTERECTOMY  2005   CATARACT EXTRACTION     CESAREAN SECTION     x2   colonscopy      CORONARY STENT INTERVENTION N/A 01/06/2020   Procedure: CORONARY STENT INTERVENTION;  Surgeon: Lorretta Harp, MD;  Location: [facility];  Service: Cardiovascular;  Laterality: N/A;   EYE SURGERY     bilat cataract surgery 2015   INTRAVASCULAR PRESSURE WIRE/FFR STUDY N/A 01/06/2020   Procedure: INTRAVASCULAR PRESSURE WIRE/FFR STUDY;  Surgeon: Lorretta Harp, MD;  Location: [facility];  Service: Cardiovascular;  Laterality: N/A;   LEFT HEART CATH AND CORONARY ANGIOGRAPHY N/A 01/06/2020   Procedure: LEFT HEART CATH AND CORONARY ANGIOGRAPHY;  Surgeon: Lorretta Harp, MD;  Location: [facility];  Service: Cardiovascular;  Laterality: N/A;   LEFT HEART CATH AND CORONARY ANGIOGRAPHY N/A 01/29/2021   Procedure: LEFT HEART CATH AND CORONARY ANGIOGRAPHY;  Surgeon: Jettie Booze, MD;  Location: [facility];  Service: Cardiovascular;  Laterality: N/A;   TOTAL KNEE ARTHROPLASTY Left 12/18/2014   Procedure: TOTAL LEFT KNEE ARTHROPLASTY;  Surgeon: Sydnee Cabal, MD;  Location: [facility];  Service: Orthopedics;  Laterality: Left;   TOTAL KNEE ARTHROPLASTY Right 05/15/2015   Procedure: RIGHT TOTAL KNEE ARTHROPLASTY;  Surgeon: Sydnee Cabal, MD;  Location: [facility];  Service: Orthopedics;  Laterality: Right;   UTERINE FIBROID SURGERY     Social History   Socioeconomic History   Marital status: Married    Spouse name: Not on file   Number of children: Not on file   Years of education: Not on file   Highest education level: Not on file  Occupational History   Occupation: retired  Tobacco Use   Smoking status: Never   Smokeless tobacco: Never  Vaping Use   Vaping Use: Never used  Substance and Sexual Activity   Alcohol use: Yes    Comment: rare   Drug use: No   Sexual activity: Not on file  Other Topics Concern   Not on file  Social  History Narrative   Not on file   Social Determinants of Health   Financial Resource Strain: Not on file  Food Insecurity: Not on file  Transportation Needs: Not on file  Physical Activity: Not on file  Stress: Not on file  Social Connections: Not on file  Intimate Partner Violence: Not on file   Current Outpatient Medications on File Prior to Visit  Medication Sig Dispense Refill   Accu-Chek Softclix Lancets lancets USE TO CHECK BLOOD SUGAR TWICE DAILY 200 each 3   acetaminophen (TYLENOL) 650 MG CR tablet Take 1,300 mg by mouth at bedtime.     alum & mag hydroxide-simeth (MAALOX MAX) 400-400-40 MG/5ML suspension Take 15 mLs  by mouth every 6 (six) hours as needed for indigestion. 355 mL 0   aspirin EC 81 MG tablet Take 1 tablet (81 mg total) by mouth in the morning.     Blood Glucose Monitoring Suppl (ACCU-CHEK GUIDE ME) w/Device KIT 1 each by Does not apply route 2 (two) times daily. E11.9 1 kit 0   carvedilol (COREG) 3.125 MG tablet Take 1 tablet (3.125 mg total) by mouth 2 (two) times daily with a meal. 180 tablet 3   Cholecalciferol (VITAMIN D) 2000 UNITS CAPS Take 2,000 Units by mouth every morning.      clopidogrel (PLAVIX) 75 MG tablet Take 1 tablet (75 mg total) by mouth daily with breakfast. 90 tablet 2   Coenzyme Q10 (COQ10) 200 MG CAPS Take 200 mg by mouth in the morning.     DULoxetine (CYMBALTA) 20 MG capsule Take 40 mg by mouth at bedtime.      furosemide (LASIX) 40 MG tablet TAKE 1 TABLET (40 MG TOTAL) BY MOUTH 3 (THREE) TIMES A WEEK. TUESDAYS, THURSDAYS & SATURDAYS IN THE MORNING 36 tablet 5   glucose blood (ACCU-CHEK GUIDE) test strip Use to check BS 2x a day 200 each 12   insulin glargine, 2 Unit Dial, (TOUJEO MAX SOLOSTAR) 300 UNIT/ML Solostar Pen Inject 18 Units into the skin daily. 6 mL 3   Insulin Pen Needle (BD PEN NEEDLE NANO U/F) 32G X 4 MM MISC 1 each by Does not apply route daily. 100 each 3   KLOR-CON M20 20 MEQ tablet TAKE 1 TABLET (20 MEQ TOTAL) BY MOUTH 3  (THREE) TIMES A WEEK. TUESDAYS, THURSDAYS & SATURDAYS IN THE MORNING 36 tablet 5   lidocaine (XYLOCAINE) 2 % solution Use as directed 15 mLs in the mouth or throat as needed for mouth pain. 100 mL 0   Multiple Vitamin (MULTIVITAMIN WITH MINERALS) TABS tablet Take 1 tablet by mouth every morning.      nitroGLYCERIN (NITROSTAT) 0.4 MG SL tablet Place 1 tablet (0.4 mg total) under the tongue every 5 (five) minutes x 3 doses as needed for chest pain. 25 tablet 4   Omega-3 Fatty Acids (RA FISH OIL) 1400 MG CPDR Take 1,400 mg by mouth 2 (two) times daily.     pantoprazole (PROTONIX) 40 MG tablet Take 40 mg by mouth daily.     ranolazine (RANEXA) 1000 MG SR tablet Take 1 tablet (1,000 mg total) by mouth 2 (two) times daily. 180 tablet 2   rosuvastatin (CRESTOR) 40 MG tablet Take 1 tablet (40 mg total) by mouth in the morning. 90 tablet 3   saccharomyces boulardii (FLORASTOR) 250 MG capsule Take 250 mg by mouth in the morning.     tirzepatide [facility]) 12.5 MG/0.5ML Pen Inject 12.5 mg into the skin once a week on Thursday. 6 mL 0   trospium (SANCTURA) 20 MG tablet Take 20 mg by mouth 2 (two) times daily.  3   vitamin C (ASCORBIC ACID) 250 MG tablet Take 250 mg by mouth in the morning.     No current facility-administered medications on file prior to visit.   Allergies  Allergen Reactions   Amoxicillin-Pot Clavulanate     Other reaction(s): vomiting   Aspirin     Other reaction(s): reflux and nervousness   Colchicine     Stomach cramps Other reaction(s): severe GI upset   Diclofenac     Oral causes stomach cramps   Dulaglutide Diarrhea and Nausea And Vomiting    Other reaction(s): diarrhea  Empagliflozin     Other reaction(s): yeast infection   Ezetimibe     Muscle pain Other reaction(s): muscle aches   Imdur [Isosorbide Nitrate] Other (See Comments)    hypotensive   Isosorbide Other (See Comments)    Causes Hypotension   Metformin     Other reaction(s): stomach upset   Repaglinide      Other reaction(s): swelling and GI upset   Sitagliptin-Metformin Hcl Nausea And Vomiting    Lightheaded  Other reaction(s): GI upset   Sulfamethoxazole-Trimethoprim Itching    Other reaction(s): itching   Imipramine Rash   Tape Rash    Paper tape ok to use    Family History  Problem Relation Age of Onset   Diabetes Maternal Grandfather    Heart attack Maternal Grandfather    Heart disease Mother    Hypertension Mother    Hyperlipidemia Mother    Cancer Mother    Heart attack Father    Hypertension Father    Hyperlipidemia Father    Heart disease Father    Sudden death Father    Sleep apnea Father    Heart attack Sister    Heart attack Paternal Uncle    Congenital heart disease Son    PE: BP 120/74 (BP Location: Right Arm, Patient Position: Sitting, Cuff Size: Normal)   Pulse 71   Ht 5' 3"$  (1.6 m)   Wt 186 lb 9.6 oz (84.6 kg)   SpO2 99%   BMI 33.05 kg/m  Wt Readings from Last 3 Encounters:  08/30/22 186 lb 9.6 oz (84.6 kg)  08/29/22 183 lb (83 kg)  08/15/22 186 lb 6.4 oz (84.6 kg)   Constitutional: overweight, in NAD Eyes: no exophthalmos ENT: no thyromegaly, no cervical lymphadenopathy Cardiovascular: RRR, No MRG Respiratory: CTA B Musculoskeletal: no deformities Skin: no rashes Neurological: no tremor with outstretched hands Diabetic Foot Exam - Simple   Simple Foot Form Diabetic Foot exam was performed with the following findings: Yes 08/30/2022 11:28 AM  Visual Inspection No deformities, no ulcerations, no other skin breakdown bilaterally: Yes Sensation Testing Intact to touch and monofilament testing bilaterally: Yes Pulse Check Posterior Tibialis and Dorsalis pulse intact bilaterally: Yes Comments + B mild edema     ASSESSMENT: 1. DM2, non-insulin-dependent, uncontrolled, without long-term complications - CAD - s/p 2 stents - stage 3 CKD  2. HL  3.  Obesity class II  PLAN:  1. Patient with longstanding, uncontrolled, type 2 diabetes, on  injectable antidiabetic regimen with long-acting insulin and GLP-1/GIP receptor agonist.  Her Mounjaro is adjusted by Dr. Leafy Ro (weight management clinic).  She is currently at 12.5 mg weekly.  At last visit, we increased her Toujeo dose since sugars were still higher than target in the morning, between 150s and 170s.  I did advise her to decrease the dose after she increases the Mounjaro dose as I expected her sugars to improve. - at today's visit, she is doing well on the higher dose of Mounjaro with improved HbA1c and blood sugars.  Will try to decrease the Toujeo dose. - I suggested to:  Patient Instructions  Please decrease: - Toujeo 14 units daily (after 1 week, if the sugars are mostly <130, you can try to decrease to 14 units)  Continue: - Mounjaro 12.5 mg weekly   Please return in 6 months with your sugar log.   - we checked her HbA1c: 5.5% (better) - advised to check sugars at different times of the day - 1x a day, rotating  check times - advised for yearly eye exams >> she is UTD - return to clinic in 6 months  2. HL - Reviewed latest lipid panel from 07/2022: LDL above our target of less than 55, otherwise fractions at goal - Continues Crestor 40 mg daily without side effects  3.  Obesity class II -She tolerates Mounjaro well.  This should greatly help with weight loss -She mentions she lost between 28 and 30 units in the last 2 years while working with a weight management clinic. -She gained 2 pounds before last visit, but lost 18 lbs since then!  Philemon Kingdom, MD PhD [facility]

## 2022-09-02 NOTE — Progress Notes (Signed)
Chief Complaint:   OBESITY Pamela Love is here to discuss her progress with her obesity treatment plan along with follow-up of her obesity related diagnoses. Pamela Love is on the Category 2 Plan and states she is following her eating plan approximately 70% of the time. Pamela Love states she is doing weights, cardio lab, and bike 90 minutes 1-2 times per week.  Today's visit was #: 74 Starting weight: 227 lbs Starting date: 03/17/2020 Today's weight: 186 lbs Today's date: 08/15/2022 Total lbs lost to date: 41 Total lbs lost since last in-office visit: +1  Interim History: Pamela Love did well over the holidays and only gained a pound. She is happy with her successes.  Subjective:   1. Type 2 diabetes mellitus with obesity (HCC) Pt did talk to Dr. Cruzita Lederer at Endo regarding Toujeo dose   Assessment/Plan:  No orders of the defined types were placed in this encounter.   There are no discontinued medications.   No orders of the defined types were placed in this encounter.    1. Type 2 diabetes mellitus with obesity ([place]) Continue Mounjaro. No refill needed at this time. Continue to ween insulin per endocrinologist. Home blood glucose monitoring as directed. Decrease simple carbs in diet and continue to exercise.  2. Obesity, current BMI 33.0 Pamela Love is currently in the action stage of change. As such, her goal is to continue with weight loss efforts. She has agreed to the Category 2 Plan with breakfast and lunch options.   Pamela Love's PCP drew labs in December 2023 for her annual exam. She will bring in labs to her next OV for review.  Decrease carbs, especially at dinner meal and track blood sugar.  Exercise goals:  As is and increase as tolerated.  Behavioral modification strategies: decreasing simple carbohydrates, avoiding temptations, and planning for success.  Pamela Love has agreed to follow-up with our clinic in 3 weeks. She was informed of the importance of frequent follow-up visits to maximize her  success with intensive lifestyle modifications for her multiple health conditions.   Objective:   Blood pressure (!) 143/83, pulse 71, temperature 97.7 F (36.5 C), height 5' 3"$  (1.6 m), weight 186 lb 6.4 oz (84.6 kg), SpO2 100 %. Body mass index is 33.02 kg/m.  General: Cooperative, alert, well developed, in no acute distress. HEENT: Conjunctivae and lids unremarkable. Cardiovascular: Regular rhythm.  Lungs: Normal work of breathing. Neurologic: No focal deficits.   Lab Results  Component Value Date   CREATININE 1.09 (H) 11/13/2021   BUN 25 (H) 11/13/2021   NA 137 11/13/2021   K 3.9 11/13/2021   CL 100 11/13/2021   CO2 27 11/13/2021   Lab Results  Component Value Date   ALT 25 04/21/2020   AST 25 04/21/2020   ALKPHOS 104 04/21/2020   BILITOT 0.5 04/21/2020   Lab Results  Component Value Date   HGBA1C 5.5 08/30/2022   HGBA1C 6.0 (H) 06/06/2022   HGBA1C 6.1 (A) 02/25/2022   HGBA1C 5.8 (A) 10/14/2021   HGBA1C 6.1 (A) 07/12/2021   No results found for: "INSULIN" Lab Results  Component Value Date   TSH 1.71 05/30/2018   Lab Results  Component Value Date   CHOL 134 12/16/2020   HDL 41 12/16/2020   LDLCALC 68 12/16/2020   TRIG 142 12/16/2020   CHOLHDL 3.3 12/16/2020   Lab Results  Component Value Date   VD25OH 64.2 06/06/2022   VD25OH 63.9 04/21/2020   Lab Results  Component Value Date   WBC 7.5  11/13/2021   HGB 14.0 11/13/2021   HCT 41.3 11/13/2021   MCV 93.0 11/13/2021   PLT 243 11/13/2021    Attestation Statements:   Reviewed by clinician on day of visit: allergies, medications, problem list, medical history, surgical history, family history, social history, and previous encounter notes.  Time spent on visit including pre-visit chart review and post-visit care and charting was 22 minutes.   I, Kathlene November, BS, CMA, am acting as transcriptionist for [employer], DO.  I have reviewed the above documentation for accuracy and completeness,  and I agree with the above. [name], D.O.  The [place] was signed into law in 2016 which includes the topic of electronic health records.  This provides immediate access to information in MyChart.  This includes consultation notes, operative notes, office notes, lab results and pathology reports.  If you have any questions about what you read please let us know at your next visit so we can discuss your concerns and take corrective action if need be.  We are right here with you.

## 2022-09-12 ENCOUNTER — Ambulatory Visit (INDEPENDENT_AMBULATORY_CARE_PROVIDER_SITE_OTHER): Payer: Medicare PPO | Admitting: Adult Health

## 2022-09-12 ENCOUNTER — Encounter (INDEPENDENT_AMBULATORY_CARE_PROVIDER_SITE_OTHER): Payer: Self-pay | Admitting: Adult Health

## 2022-09-12 VITALS — BP 127/72 | HR 78 | Temp 97.8°F | Ht 63.0 in | Wt 184.0 lb

## 2022-09-12 DIAGNOSIS — Z794 Long term (current) use of insulin: Secondary | ICD-10-CM | POA: Diagnosis not present

## 2022-09-12 DIAGNOSIS — G4733 Obstructive sleep apnea (adult) (pediatric): Secondary | ICD-10-CM

## 2022-09-12 DIAGNOSIS — Z7985 Long-term (current) use of injectable non-insulin antidiabetic drugs: Secondary | ICD-10-CM

## 2022-09-12 DIAGNOSIS — E1169 Type 2 diabetes mellitus with other specified complication: Secondary | ICD-10-CM | POA: Diagnosis not present

## 2022-09-12 DIAGNOSIS — E669 Obesity, unspecified: Secondary | ICD-10-CM | POA: Diagnosis not present

## 2022-09-12 DIAGNOSIS — Z6832 Body mass index (BMI) 32.0-32.9, adult: Secondary | ICD-10-CM

## 2022-09-13 NOTE — Progress Notes (Signed)
Chief Complaint:   OBESITY Pamela Love is here to discuss her progress with her obesity treatment plan along with follow-up of her obesity related diagnoses. Pamela Love is on the Category 2 Plan and states she is following her eating plan approximately 70% of the time. Pamela Love states she is doing cardio and dancing 45-60+ minutes 1 times per week.  Today's visit was #: 12 Starting weight: 47 LBS Starting date: 03/17/2020 Today's weight: 183 LBS Today's date: 08/29/2022 Total lbs lost to date: 67 LBS Total lbs lost since last in-office visit: 3 LBS  Interim History: 07/1822 PCP/Dr. Brigitte Pulse OV with labs- reviewed at length with pt.  Health Goals for 2024  1) Safely titrate off Toujeo 2) Continue with successful weight loss, down 45 lbs!  Subjective:   1. Type 2 diabetes mellitus with obesity (HCC) Lab Results  Component Value Date   HGBA1C 5.5 08/30/2022   HGBA1C 6.0 (H) 06/06/2022   HGBA1C 6.1 (A) 02/25/2022    Fasting blood glucose, 134, 159, 121.  Postprandial 103, 93, 119, 128, 119, 74. (Only fatigue).   Endocrinology Dr. Cruzita Lederer manages Toujeo 18 units.   [facility]y weight and wellness manages Mounjaro 12.5 mg  Mounjaro therapy increased from 10 mg to 12.5 in the middle of November 2023. She denies sx's of hypoglycemia. She denies mass in neck, dysphagia, dyspepsia, persistent hoarseness, abdominal pain, or N/V/Constipation.  Assessment/Plan:   1. Type 2 diabetes mellitus with obesity ([place]) Follow-up with Dr. Cruzita Lederer endocrinology tomorrow, on 08/30/2022  2. Obesity, current BMI 32.6 Pamela Love is currently in the action stage of change. As such, her goal is to continue with weight loss efforts. She has agreed to the Category 2 Plan.   Exercise goals:  As is.  Behavioral modification strategies: increasing lean protein intake, decreasing simple carbohydrates, meal planning and cooking strategies, keeping healthy foods in the home, and planning for success.  [place] has agreed to  follow-up with our clinic in 3 weeks. She was informed of the importance of frequent follow-up visits to maximize her success with intensive lifestyle modifications for her multiple health conditions.   Objective:   Blood pressure 116/72, pulse 84, temperature 98.1 F (36.7 C), height 5' 3"$  (1.6 m), weight 183 lb (83 kg), SpO2 99 %. Body mass index is 32.42 kg/m.  General: Cooperative, alert, well developed, in no acute distress. HEENT: Conjunctivae and lids unremarkable. Cardiovascular: Regular rhythm.  Lungs: Normal work of breathing. Neurologic: No focal deficits.   Lab Results  Component Value Date   CREATININE 1.09 (H) 11/13/2021   BUN 25 (H) 11/13/2021   NA 137 11/13/2021   K 3.9 11/13/2021   CL 100 11/13/2021   CO2 27 11/13/2021   Lab Results  Component Value Date   ALT 25 04/21/2020   AST 25 04/21/2020   ALKPHOS 104 04/21/2020   BILITOT 0.5 04/21/2020   Lab Results  Component Value Date   HGBA1C 5.5 08/30/2022   HGBA1C 6.0 (H) 06/06/2022   HGBA1C 6.1 (A) 02/25/2022   HGBA1C 5.8 (A) 10/14/2021   HGBA1C 6.1 (A) 07/12/2021   No results found for: "INSULIN" Lab Results  Component Value Date   TSH 1.71 05/30/2018   Lab Results  Component Value Date   CHOL 134 12/16/2020   HDL 41 12/16/2020   LDLCALC 68 12/16/2020   TRIG 142 12/16/2020   CHOLHDL 3.3 12/16/2020   Lab Results  Component Value Date   VD25OH 64.2 06/06/2022   VD25OH 63.9 04/21/2020   Lab  Results  Component Value Date   WBC 7.5 11/13/2021   HGB 14.0 11/13/2021   HCT 41.3 11/13/2021   MCV 93.0 11/13/2021   PLT 243 11/13/2021   No results found for: "IRON", "TIBC", "FERRITIN"  Attestation Statements:   Reviewed by clinician on day of visit: allergies, medications, problem list, medical history, surgical history, family history, social history, and previous encounter notes.  I, Davy Pique, RMA, am acting as Location manager for [name], NP.  I have reviewed the above  documentation for accuracy and completeness, and I agree with the above. -  Adel Neyer d. Norman Bier, NP-C

## 2022-09-14 ENCOUNTER — Other Ambulatory Visit (HOSPITAL_BASED_OUTPATIENT_CLINIC_OR_DEPARTMENT_OTHER): Payer: Self-pay

## 2022-09-15 ENCOUNTER — Other Ambulatory Visit (HOSPITAL_BASED_OUTPATIENT_CLINIC_OR_DEPARTMENT_OTHER): Payer: Self-pay

## 2022-09-15 ENCOUNTER — Other Ambulatory Visit (HOSPITAL_BASED_OUTPATIENT_CLINIC_OR_DEPARTMENT_OTHER): Payer: Self-pay | Admitting: Cardiology

## 2022-09-15 DIAGNOSIS — Z961 Presence of intraocular lens: Secondary | ICD-10-CM | POA: Diagnosis not present

## 2022-09-15 DIAGNOSIS — I2510 Atherosclerotic heart disease of native coronary artery without angina pectoris: Secondary | ICD-10-CM

## 2022-09-15 DIAGNOSIS — E119 Type 2 diabetes mellitus without complications: Secondary | ICD-10-CM | POA: Diagnosis not present

## 2022-09-15 NOTE — Telephone Encounter (Signed)
Rx request sent to pharmacy.  

## 2022-09-17 NOTE — Progress Notes (Signed)
Chief Complaint:   OBESITY Pamela Love is here to discuss her progress with her obesity treatment plan along with follow-up of her obesity related diagnoses. Pamela Love is on the Category 2 Plan and states she is following her eating plan approximately 65% of the time. Pamela Love states she is dancing/elliptical/treadmill-20 minutes 2-3 times per week.  Today's visit was #: 82 Starting weight: 227 lbs Starting date: 03/17/20 Today's weight: 184 lbs Today's date: 09/17/2022 Total lbs lost to date: 42 lbs Total lbs lost since last in-office visit: + 1 lb  Interim History:  Pamela Love has been alternating care between myself and Dr. Raliegh Scarlet at [facility]> Overall she has been quite successful and she is very organized and compliant.  She is please to be slowly titrating down on Touejo therapy.  Subjective:   1. Type 2 diabetes mellitus with other specified complication, with long-term current use of insulin (HCC) Lab Results  Component Value Date   HGBA1C 5.5 08/30/2022   HGBA1C 6.0 (H) 06/06/2022   HGBA1C 6.1 (A) 02/25/2022    08/30/22 Dr. Gherge/Endocrinology OV Notes: Pt is on a regimen of: - Toujeo 16 >> 18 units daily - Mounjaro 7.5 >> 10 >> 12.5 mg weekly - [facility] She previously tried Victoza and Ozempic. She tried Trulicity and Ozempic >> nausea She tried [place] >> vaginal yeast infections She tried repaglinide >> diarrhea Reviewing Dr. Cordelia Pen last note, she declined multiple daily injections in the past. PLAN:  1. Patient with longstanding, uncontrolled, type 2 diabetes, on injectable antidiabetic regimen with long-acting insulin and GLP-1/GIP receptor agonist.  Her Mounjaro is adjusted by Dr. Leafy Ro (weight management clinic).  She is currently at 12.5 mg weekly.  At last visit, we increased her Toujeo dose since sugars were still higher than target in the morning, between 150s and 170s.  I did advise her to decrease the dose after she increases the  Mounjaro dose as I expected her sugars to improve. - at today's visit, she is doing well on the higher dose of Mounjaro with improved HbA1c and blood sugars.  Will try to decrease the Toujeo dose. - I suggested to:  Patient Instructions  Please decrease: - Toujeo 14 units daily (after 1 week, if the sugars are mostly <130, you can try to decrease to 14 units)   Continue: - Mounjaro 12.5 mg weekly   Please return in 6 months with your sugar log.    - we checked her HbA1c: 5.5% (better) - advised to check sugars at different times of the day - 1x a day, rotating check times - advised for yearly eye exams >> she is UTD - return to clinic in 6 months  Reviewed home CBG log and ALL but 2 readings were <130.  Per Dr. Chrissie Noa instructions she can safely reduce Toujeo therapy from 16U to 14 U  2. OSA (obstructive sleep apnea) Pamela Love is compliant with nightly CPAP- via Nasal Pillow.  The device is causing skin breakdown and irritation on nasal septum and phiiltrim. She is curious about implantable Inspire device.   Assessment/Plan:   1. Type 2 diabetes mellitus with other specified complication, with long-term current use of insulin (HCC) Continue Mounjaro 12.66m once weekly injection, does not require RF today. Reduce Toujeo from 16U to 14U Continue to closely monitor home CBG and bring log to f/u OV  2. OSA (obstructive sleep apnea) F/u Cardiologist/Manages CPAP currently  3. BMI 32.0-32.9,adult  Pamela Love currently in the  action stage of change. As such, her goal is to continue with weight loss efforts. She has agreed to the Category 2 Plan.   Exercise goals: Older adults should determine their level of effort for physical activity relative to their level of fitness.   Behavioral modification strategies: increasing lean protein intake, decreasing simple carbohydrates, increasing vegetables, increasing water intake, meal planning and cooking strategies, and keeping healthy foods  in the home.  Pamela Love has agreed to follow-up with our clinic in 2 weeks. She was informed of the importance of frequent follow-up visits to maximize her success with intensive lifestyle modifications for her multiple health conditions.   Objective:   Blood pressure 127/72, pulse 78, temperature 97.8 F (36.6 C), height 5' 3"$  (1.6 m), weight 184 lb (83.5 kg), SpO2 93 %. Body mass index is 32.59 kg/m.  General: Cooperative, alert, well developed, in no acute distress. HEENT: Conjunctivae and lids unremarkable. Cardiovascular: Regular rhythm.  Lungs: Normal work of breathing. Neurologic: No focal deficits.   Lab Results  Component Value Date   CREATININE 1.09 (H) 11/13/2021   BUN 25 (H) 11/13/2021   NA 137 11/13/2021   K 3.9 11/13/2021   CL 100 11/13/2021   CO2 27 11/13/2021   Lab Results  Component Value Date   ALT 25 04/21/2020   AST 25 04/21/2020   ALKPHOS 104 04/21/2020   BILITOT 0.5 04/21/2020   Lab Results  Component Value Date   HGBA1C 5.5 08/30/2022   HGBA1C 6.0 (H) 06/06/2022   HGBA1C 6.1 (A) 02/25/2022   HGBA1C 5.8 (A) 10/14/2021   HGBA1C 6.1 (A) 07/12/2021   No results found for: "INSULIN" Lab Results  Component Value Date   TSH 1.71 05/30/2018   Lab Results  Component Value Date   CHOL 134 12/16/2020   HDL 41 12/16/2020   LDLCALC 68 12/16/2020   TRIG 142 12/16/2020   CHOLHDL 3.3 12/16/2020   Lab Results  Component Value Date   VD25OH 64.2 06/06/2022   VD25OH 63.9 04/21/2020   Lab Results  Component Value Date   WBC 7.5 11/13/2021   HGB 14.0 11/13/2021   HCT 41.3 11/13/2021   MCV 93.0 11/13/2021   PLT 243 11/13/2021   No results found for: "IRON", "TIBC", "FERRITIN"  Attestation Statements:   Reviewed by clinician on day of visit: allergies, medications, problem list, medical history, surgical history, family history, social history, and previous encounter notes.  I have reviewed the above documentation for accuracy and completeness, and  I agree with the above. -  Nancyjo Givhan d. Jabe Jeanbaptiste, NP-C

## 2022-09-18 ENCOUNTER — Other Ambulatory Visit (HOSPITAL_BASED_OUTPATIENT_CLINIC_OR_DEPARTMENT_OTHER): Payer: Self-pay

## 2022-09-20 ENCOUNTER — Other Ambulatory Visit (HOSPITAL_BASED_OUTPATIENT_CLINIC_OR_DEPARTMENT_OTHER): Payer: Self-pay

## 2022-09-23 ENCOUNTER — Other Ambulatory Visit: Payer: Self-pay | Admitting: Family Medicine

## 2022-09-23 ENCOUNTER — Other Ambulatory Visit (HOSPITAL_BASED_OUTPATIENT_CLINIC_OR_DEPARTMENT_OTHER): Payer: Self-pay

## 2022-09-23 DIAGNOSIS — Z1231 Encounter for screening mammogram for malignant neoplasm of breast: Secondary | ICD-10-CM

## 2022-09-26 ENCOUNTER — Other Ambulatory Visit (HOSPITAL_BASED_OUTPATIENT_CLINIC_OR_DEPARTMENT_OTHER): Payer: Self-pay

## 2022-09-26 ENCOUNTER — Ambulatory Visit (INDEPENDENT_AMBULATORY_CARE_PROVIDER_SITE_OTHER): Payer: Medicare PPO | Admitting: Adult Health

## 2022-09-27 ENCOUNTER — Encounter (INDEPENDENT_AMBULATORY_CARE_PROVIDER_SITE_OTHER): Payer: Self-pay | Admitting: Adult Health

## 2022-09-27 ENCOUNTER — Other Ambulatory Visit (HOSPITAL_BASED_OUTPATIENT_CLINIC_OR_DEPARTMENT_OTHER): Payer: Self-pay

## 2022-09-27 ENCOUNTER — Ambulatory Visit (INDEPENDENT_AMBULATORY_CARE_PROVIDER_SITE_OTHER): Payer: Medicare PPO | Admitting: Adult Health

## 2022-09-27 DIAGNOSIS — Z7985 Long-term (current) use of injectable non-insulin antidiabetic drugs: Secondary | ICD-10-CM

## 2022-09-27 DIAGNOSIS — Z6832 Body mass index (BMI) 32.0-32.9, adult: Secondary | ICD-10-CM

## 2022-09-27 DIAGNOSIS — E669 Obesity, unspecified: Secondary | ICD-10-CM | POA: Diagnosis not present

## 2022-09-27 DIAGNOSIS — E1169 Type 2 diabetes mellitus with other specified complication: Secondary | ICD-10-CM

## 2022-09-27 MED ORDER — TIRZEPATIDE 12.5 MG/0.5ML ~~LOC~~ SOAJ
12.5000 mg | SUBCUTANEOUS | 0 refills | Status: DC
  Filled 2022-09-27: qty 6, 84d supply, fill #0
  Filled 2022-10-19: qty 2, 28d supply, fill #0

## 2022-09-27 NOTE — Progress Notes (Addendum)
Chief Complaint:   OBESITY Pamela Love is here to discuss her progress with her obesity treatment plan along with follow-up of her obesity related diagnoses. Pamela Love is on the Category 2 Plan and states she is following her eating plan approximately 60% of the time.  Pamela Love states she is Gym/Dancing 60+ minutes 1/1 times per week.  Today's visit was #: 73 Starting weight: 227 lbs Starting date: 03/17/2020 Today's weight: 185 lbs Today's date: 09/27/2022 Total lbs lost to date: 41 lbs Total lbs lost since last in-office visit: + 1 lb  Interim History:  She presents today with acute sx's of HA, non-productive cough, and fatigue. She denies fever, dyspnea, chills, and night sweats. She reports sx's present for several days, home COVID-19 test was negative. She has been around extended family, ie: school age children recently.  She has brought home CBG log- she is eager to continue to wean down Toujeo (safely).  Subjective:   1. Type 2 diabetes mellitus with obesity (HCC) Lab Results  Component Value Date   HGBA1C 5.5 08/30/2022   HGBA1C 6.0 (H) 06/06/2022   HGBA1C 6.1 (A) 02/25/2022   Reviewed CBG log Fasting: 126, 106, 124 PP: 139, 123 QHS: 89, 119, 112, 98 She denies sx's of hypoglycemia Currently on Toujeo 14 U QD and weekly Mounjaro 12.5mg - denies medication SE 08/30/2022-Per Dr. Cruzita Lederer  Patient Instructions  Please decrease: - Toujeo 14 units daily (after 1 week, if the sugars are mostly <130, you can try to decrease to 14 units)  Assessment/Plan:   1. Type 2 diabetes mellitus with obesity (HCC) Refill - tirzepatide (MOUNJARO) 12.5 MG/0.5ML Pen; Inject 12.5 mg into the skin once a week on Thursday.  Dispense: 6 mL; Refill: 0  Decrease Toujeo from 14 U to 12 U- continue to closely monitor CBG-bring log to f/u.  3. Obesity, current BMI 32.9  Pamela Love is currently in the action stage of change. As such, her goal is to continue with weight loss efforts. She has agreed to  the Category 2 Plan.   Exercise goals: Older adults should follow the adult guidelines. When older adults cannot meet the adult guidelines, they should be as physically active as their abilities and conditions will allow.   Behavioral modification strategies: increasing lean protein intake, decreasing simple carbohydrates, increasing vegetables, increasing water intake, no skipping meals, meal planning and cooking strategies, keeping healthy foods in the home, better snacking choices, and planning for success.  Pamela Love has agreed to follow-up with our clinic in 2 weeks. She was informed of the importance of frequent follow-up visits to maximize her success with intensive lifestyle modifications for her multiple health conditions.   Objective:   Blood pressure 115/75, pulse 80, temperature 97.9 F (36.6 C), height 5\' 3"  (1.6 m), weight 185 lb (83.9 kg), SpO2 (!) 86 %. Body mass index is 32.77 kg/m.  General: Cooperative, alert, well developed, in no acute distress. HEENT: Conjunctivae and lids unremarkable. Cardiovascular: Regular rhythm.  Lungs: Normal work of breathing. Neurologic: No focal deficits.   Lab Results  Component Value Date   CREATININE 1.09 (H) 11/13/2021   BUN 25 (H) 11/13/2021   NA 137 11/13/2021   K 3.9 11/13/2021   CL 100 11/13/2021   CO2 27 11/13/2021   Lab Results  Component Value Date   ALT 25 04/21/2020   AST 25 04/21/2020   ALKPHOS 104 04/21/2020   BILITOT 0.5 04/21/2020   Lab Results  Component Value Date   HGBA1C 5.5 08/30/2022  HGBA1C 6.0 (H) 06/06/2022   HGBA1C 6.1 (A) 02/25/2022   HGBA1C 5.8 (A) 10/14/2021   HGBA1C 6.1 (A) 07/12/2021   No results found for: "INSULIN" Lab Results  Component Value Date   TSH 1.71 05/30/2018   Lab Results  Component Value Date   CHOL 134 12/16/2020   HDL 41 12/16/2020   LDLCALC 68 12/16/2020   TRIG 142 12/16/2020   CHOLHDL 3.3 12/16/2020   Lab Results  Component Value Date   VD25OH 64.2 06/06/2022    VD25OH 63.9 04/21/2020   Lab Results  Component Value Date   WBC 7.5 11/13/2021   HGB 14.0 11/13/2021   HCT 41.3 11/13/2021   MCV 93.0 11/13/2021   PLT 243 11/13/2021   No results found for: "IRON", "TIBC", "FERRITIN"  Attestation Statements:   Reviewed by clinician on day of visit: allergies, medications, problem list, medical history, surgical history, family history, social history, and previous encounter notes.  I have reviewed the above documentation for accuracy and completeness, and I agree with the above. -  Karo Rog d. Blossie Raffel, NP-C

## 2022-10-11 ENCOUNTER — Ambulatory Visit (INDEPENDENT_AMBULATORY_CARE_PROVIDER_SITE_OTHER): Payer: Medicare PPO | Admitting: Family Medicine

## 2022-10-11 ENCOUNTER — Encounter (INDEPENDENT_AMBULATORY_CARE_PROVIDER_SITE_OTHER): Payer: Self-pay | Admitting: Family Medicine

## 2022-10-11 VITALS — BP 100/67 | HR 85 | Temp 98.2°F | Ht 63.0 in | Wt 181.4 lb

## 2022-10-11 DIAGNOSIS — I10 Essential (primary) hypertension: Secondary | ICD-10-CM

## 2022-10-11 DIAGNOSIS — Z6832 Body mass index (BMI) 32.0-32.9, adult: Secondary | ICD-10-CM

## 2022-10-11 DIAGNOSIS — E1169 Type 2 diabetes mellitus with other specified complication: Secondary | ICD-10-CM

## 2022-10-11 DIAGNOSIS — Z7985 Long-term (current) use of injectable non-insulin antidiabetic drugs: Secondary | ICD-10-CM | POA: Diagnosis not present

## 2022-10-11 DIAGNOSIS — Z794 Long term (current) use of insulin: Secondary | ICD-10-CM | POA: Diagnosis not present

## 2022-10-11 DIAGNOSIS — I152 Hypertension secondary to endocrine disorders: Secondary | ICD-10-CM

## 2022-10-11 NOTE — Progress Notes (Signed)
[name], D.O.  ABFM, ABOM Specializing in Clinical Bariatric Medicine  Office located at: [address], [place]  [address]     Assessment and Plan:   Morbid obesity (HCC)-START BMI 40.21/DATE 03/17/20 Assessment: Condition is At goal.. Labs were reviewed. . Biometric data collected today, was reviewed with patient.  Fat mass has decreased by 3lb. Muscle mass has decreased by 1.2lb. Total body water has remained the same.  She reports being compliant with her meal plan. She doesn't eat a lot of snacks, typically has a Yasso bar as her dessert. She states that she doesn't drink coffee.   Plan: Will continue the category 2 meal plan and progress back into exercise regimen once she is recovered from Covid.    Type 2 diabetes mellitus with obesity (HCC) Assessment: Condition is At goal.. Labs were reviewed.  Lab Results  Component Value Date   HGBA1C 5.5 08/30/2022   HGBA1C 6.0 (H) 06/06/2022   HGBA1C 6.1 (A) 02/25/2022  Her blood sugars have been well controlled- in the 90s for the most part. Her PCP has decreased her Toujeo insulin dose to 12 units. She continues to be compliant with Mounjaro 12.5 mg weekly. Denies any intolerances.   Plan: Continue Toujeo dose per the recommendation of her PCP. Continue Mounjaro at current dose. Continue intensive lifestyle modification including diet, exercise and weight loss. We extensively discussed the importance of decreasing simple carbs and how certain foods they eat will affect their blood sugars.  Reminded Pamela Love if she feels poorly- check Blood Sugar and Blood Pressure at that time.  Hypoglycemia prevention discussed with the patient.  Eat on a regular basis- no skipping or going long periods without eating.   Recommend that any concerns about medicines should be directed at the prescribing provider.  Recheck labs in 3 months if not done at Endo provider / PCP.  - Importance of f/up with PCP and all other  specialists, as scheduled, was stressed to the patient today  No results found for: "INSULIN" Lab Results  Component Value Date   HGBA1C 5.5 08/30/2022   HGBA1C 6.0 (H) 06/06/2022   HGBA1C 6.1 (A) 02/25/2022     Hypertension  Assessment: Condition is At goal.. Labs were reviewed.  BP Readings from Last 3 Encounters:  10/12/22 122/64  10/11/22 100/67  09/27/22 115/75  Compliant with Coreg and Lasix without any intolerances. She is asymptomatic with lower blood pressures. Lab Results  Component Value Date   CREATININE 1.09 (H) 11/13/2021   BUN 25 (H) 11/13/2021   NA 137 11/13/2021   K 3.9 11/13/2021   CL 100 11/13/2021   CO2 27 11/13/2021   Plan:  Continue BP medication at the recommendations of her PCP Lifestyle changes such as following our low salt, heart healthy meal plan and engaging in a regular exercise program discussed.  Avoid buying foods that are: processed, frozen, or prepackaged to avoid excess salt. - We will continue to monitor closely alongside PCP/ specialists.  Pt reminded to also f/up with those individuals as instructed by them.  - We will continue to monitor symptoms as they relate to the her weight loss journey.     TREATMENT PLAN FOR OBESITY:  Recommended Dietary Goals Pamela Love is currently in the action stage of change. As such, her goal is to continue weight management plan. She has agreed to continue the Category 2 Plan.  Behavioral Intervention Additional resources provided today: patient declined. Evidence-based interventions for health  behavior change were utilized today including the discussion of self monitoring techniques, problem-solving barriers and SMART goal setting techniques.   Regarding patient's less desirable eating habits and patterns, we employed the technique of small changes.  Pt will specifically work on: increase exercise to a min 60 minutes 3x a week for next visit.    Recommended Physical Activity Goals Pamela Love has been advised  to work up to 150 minutes of moderate intensity aerobic activity a week and strengthening exercises 2-3 times per week for cardiovascular health, weight loss maintenance and preservation of muscle mass.  She has agreed to continue physical activity as is.    FOLLOW UP: Return in about 2 weeks (around 10/25/2022).Marland Kitchen She was informed of the importance of frequent follow up visits to maximize her success with intensive lifestyle modifications for her multiple health conditions.    Subjective:   Chief complaint: Obesity Pamela Love is here to discuss her progress with her obesity treatment plan. She is on the the Category 2 Plan and states she is following her eating plan approximately 65% of the time. She states she is exercising 90 minutes of dance and 60 minutes of cardio 1-2 days per week.  Interval History:  Pamela Love is here for a follow up office visit. We reviewed her meal plan and all questions were answered. Patient's food recall appears to be accurate and consistent with what is on plan when she is following it. When eating on plan, her hunger and cravings are well controlled.     Since last office visit she and her husband tested positive for Covid. Her PCP prescribed Lagevrio for her Covid infection and she felt much better with this. She states that she felt tired and her energy levels has went down since having Covid. She reports being compliant with her meal plan. She doesn't eat a lot of snacks, typically has a Yasso bar as her dessert. She states that she doesn't drink coffee. She states she started a new exercise regimen that includes weights and walking.    Pharmacotherapy for weight loss: She is currently taking Mounjaro for medical weight loss.  Denies side effects.    Review of Systems:  Pertinent positives were addressed with patient today.    Weight Summary and Biometrics  All data obtained today reviewed with pt   Objective:   PHYSICAL EXAM:  Blood pressure  100/67, pulse 85, temperature 98.2 F (36.8 C), height 5\' 3"  (1.6 m), weight 181 lb 6.4 oz (82.3 kg), SpO2 98 %. Body mass index is 32.13 kg/m.  General: Well Developed, well nourished, and in no acute distress.  HEENT: Normocephalic, atraumatic Skin: Warm and dry, cap RF less 2 sec, good turgor Chest:  Normal excursion, shape, no gross abn Respiratory: speaking in full sentences, no conversational dyspnea NeuroM-Sk: Ambulates w/o assistance, moves * 4 Psych: A and O *3, insight good, mood-full  DIAGNOSTIC DATA REVIEWED:  BMET    Component Value Date/Time   NA 137 11/13/2021 1933   NA 140 01/26/2021 1427   K 3.9 11/13/2021 1933   CL 100 11/13/2021 1933   CO2 27 11/13/2021 1933   GLUCOSE 134 (H) 11/13/2021 1933   BUN 25 (H) 11/13/2021 1933   BUN 28 (H) 01/26/2021 1427   CREATININE 1.09 (H) 11/13/2021 1933   CALCIUM 10.1 11/13/2021 1933   GFRNONAA 54 (L) 11/13/2021 1933   GFRAA 54 (L) 05/12/2020 1557   Lab Results  Component Value Date   HGBA1C 5.5 08/30/2022  HGBA1C 6 1 02/09/2015   No results found for: "INSULIN" Lab Results  Component Value Date   TSH 1.71 05/30/2018   CBC    Component Value Date/Time   WBC 7.5 11/13/2021 1933   RBC 4.44 11/13/2021 1933   HGB 14.0 11/13/2021 1933   HGB 13.8 01/26/2021 1427   HCT 41.3 11/13/2021 1933   HCT 41.6 01/26/2021 1427   PLT 243 11/13/2021 1933   PLT 229 01/26/2021 1427   MCV 93.0 11/13/2021 1933   MCV 92 01/26/2021 1427   MCH 31.5 11/13/2021 1933   MCHC 33.9 11/13/2021 1933   RDW 12.1 11/13/2021 1933   RDW 13.2 01/26/2021 1427   Iron Studies No results found for: "IRON", "TIBC", "FERRITIN", "IRONPCTSAT" Lipid Panel     Component Value Date/Time   CHOL 134 12/16/2020 0957   TRIG 142 12/16/2020 0957   HDL 41 12/16/2020 0957   CHOLHDL 3.3 12/16/2020 0957   LDLCALC 68 12/16/2020 0957   Hepatic Function Panel     Component Value Date/Time   PROT 7.5 04/21/2020 1209   ALBUMIN 4.8 (H) 04/21/2020 1209   AST  25 04/21/2020 1209   ALT 25 04/21/2020 1209   ALKPHOS 104 04/21/2020 1209   BILITOT 0.5 04/21/2020 1209      Component Value Date/Time   TSH 1.71 05/30/2018 0000   Nutritional Lab Results  Component Value Date   VD25OH 64.2 06/06/2022   VD25OH 63.9 04/21/2020    Attestations:   Reviewed by clinician on day of visit: allergies, medications, problem list, medical history, surgical history, family history, social history, and previous encounter notes.  I,Safa M Kadhim,acting as a scribe for [employer], DO.,have documented all relevant documentation on the behalf of Mellody Dance, DO,as directed by  Mellody Dance, DO while in the presence of Mellody Dance, DO.   I, Mellody Dance, DO, have reviewed all documentation for this visit. The documentation on 10/17/22 for the exam, diagnosis, procedures, and orders are all accurate and complete.

## 2022-10-12 ENCOUNTER — Encounter: Payer: Self-pay | Admitting: Cardiovascular Disease

## 2022-10-12 ENCOUNTER — Ambulatory Visit: Payer: Medicare PPO | Attending: Cardiovascular Disease | Admitting: Cardiovascular Disease

## 2022-10-12 VITALS — BP 122/64 | HR 78 | Ht 63.0 in | Wt 184.4 lb

## 2022-10-12 DIAGNOSIS — I10 Essential (primary) hypertension: Secondary | ICD-10-CM | POA: Diagnosis not present

## 2022-10-12 DIAGNOSIS — G4733 Obstructive sleep apnea (adult) (pediatric): Secondary | ICD-10-CM | POA: Diagnosis not present

## 2022-10-12 DIAGNOSIS — I2510 Atherosclerotic heart disease of native coronary artery without angina pectoris: Secondary | ICD-10-CM

## 2022-10-12 DIAGNOSIS — E6609 Other obesity due to excess calories: Secondary | ICD-10-CM

## 2022-10-12 DIAGNOSIS — Z6832 Body mass index (BMI) 32.0-32.9, adult: Secondary | ICD-10-CM

## 2022-10-12 DIAGNOSIS — Z794 Long term (current) use of insulin: Secondary | ICD-10-CM | POA: Diagnosis not present

## 2022-10-12 DIAGNOSIS — E1169 Type 2 diabetes mellitus with other specified complication: Secondary | ICD-10-CM

## 2022-10-12 NOTE — Patient Instructions (Signed)
Medication Instructions:   Your physician recommends that you continue on your current medications as directed. Please refer to the Current Medication list given to you today.  *If you need a refill on your cardiac medications before your next appointment, please call your pharmacy*  Lab Work: NONE ordered at this time of appointment   If you have labs (blood work) drawn today and your tests are completely normal, you will receive your results only by: [place] (if you have MyChart) OR A paper copy in the mail If you have any lab test that is abnormal or we need to change your treatment, we will call you to review the results.  Testing/Procedures: NONE ordered at this time of appointment   Follow-Up: At [facility], you and your health needs are our priority.  As part of our continuing mission to provide you with exceptional heart care, we have created designated Provider Care Teams.  These Care Teams include your primary Cardiologist (physician) and Advanced Practice Providers (APPs -  Physician Assistants and Nurse Practitioners) who all work together to provide you with the care you need, when you need it.  Your next appointment:   1 year(s)  Provider:   Troy Sine, MD     Other Instructions

## 2022-10-12 NOTE — Progress Notes (Signed)
Cardiology Office Note    Date:  10/17/2022   ID:  Pamela Love 06-12-1951, MRN [id number]  PCP:  Pamela Neer, MD  Cardiologist:  Pamela Majestic, MD (sleep); Pamela Pick Tobb,DO  2-year follow-up sleep evaluation  History of Present Illness:  Pamela Love is a 72 y.o. female who has a history of coronary artery disease and is status post PCI to her RCA in June 2021 as well as a history of hypertension, type 2 diabetes mellitus, and hyperlipidemia.  She is followed by Pamela Love for her cardiology care.  I last saw her on September 28, 2020.  She presents for 2-year follow-up sleep evaluation.   Due to concerns for obstructive sleep apnea she was referred for a diagnostic polysomnogram which was done on January 31, 2020.  She was found to have moderate overall sleep apnea with an AHI of 18.2/h.  She was unable to achieve any REM sleep.  She had oxygen desaturation to a nadir of 84% and there was evidence for moderate snoring during the evaluation.  She subsequently underwent a CPAP titration study on April 24, 2020.  CPAP was initiated at 5 cm and was titrated to 14 cm of water with an AHI of 5.6 and O2 nadir at 85%.  Again, she did not achieve any REM sleep throughout the study.  An optimal PAP pressure could not be selectively she ultimately was recommended to initiate therapy with CPAP auto at a range of 11-20 with EPR of 3.  I saw her for my initial sleep evaluation on September 28, 2020.  Prior to initiating CPAP therapy she had complaints of snoring, nonrestorative sleep, and occasional nocturia.  Her CPAP set up date was June 18, 2020.  Since being on therapy, she is unaware of any breakthrough snoring her sleep is more restful and she feels more alert.  An Epworth Sleepiness Scale score was recalculated in the office today and this endorsed at 7 arguing against daytime sleepiness.  I was able to obtain a download from from January 27 through September 25, 2020.   Compliance is excellent with 100% use and average use at 7 hours per night.  Her CPAP is set at a pressure range of 8 to 18 cm of water.  AHI is excellent at 1.4/h.  Her 95th percentile pressure is 15 with a maximum average pressure at 16.7 cm of water.    Since I last saw her, she admits to a 47 pound weight loss in 2-1/2 years and has been participating in the [place] [facility] weight and wellness program.  She has continued to use CPAP therapy and a download was obtained from January 15 through September 15, 2022 which shows excellent compliance at 100%.  Average sleep duration with CPAP was 7 hours and 21 minutes.  At a pressure range of 10 to 20 cm of water, AHI is excellent at 0 point with her 95th percentile pressure at 615.7 and maximum average pressure at 17.1.  She does have mask leak.  She also has had some issues with blistering on the bridge of her nose.  She now uses [facility] as her DME provider.  She presents for a 2-year follow-up evaluation.  Past Medical History:  Diagnosis Date   Abnormal nuclear stress test 01/02/2020   Arthritis    Bilateral leg edema 01/02/2020   Bursitis    hips   C. difficile colitis 12/2017   CAD (coronary artery  disease) 01/06/2020   CKD (chronic kidney disease), stage III (HCC)    Complication of anesthesia    "spinal did not work with second c section"   Diabetes mellitus ([place]) 11/19/2015   Diabetes mellitus, type 2 ([place])    diet controlled - has Rx for Glymipride but has not started taking yet   Dyslipidemia 10/21/2014   GERD (gastroesophageal reflux disease)    History of nonmelanoma skin cancer    History of skin cancer    Hypercholesterolemia 12/11/2019   Hyperlipidemia    Hypertension    Hypertensive disorder 10/21/2014   Lower extremity edema    Mixed hyperlipidemia 01/02/2020   OAB (overactive bladder)    Obesity (BMI 30-39.9) 05/12/2020   OSA (obstructive sleep apnea) 05/12/2020   Osteoarthritis of right knee 05/15/2015    Osteoarthrosis, unspecified whether generalized or localized, involving lower leg 10/21/2014   Osteoporosis    Over weight    Primary osteoarthritis of left knee 12/18/2014   S/P knee replacement 12/18/2014   S/P total knee replacement using cement 05/15/2015   Sleep apnea    Tachycardia 12/11/2019    Past Surgical History:  Procedure Laterality Date   ABDOMINAL HYSTERECTOMY  2005   CATARACT EXTRACTION     CESAREAN SECTION     x2   colonscopy      CORONARY STENT INTERVENTION N/A 01/06/2020   Procedure: CORONARY STENT INTERVENTION;  Surgeon: Lorretta Harp, MD;  Location: [facility];  Service: Cardiovascular;  Laterality: N/A;   EYE SURGERY     bilat cataract surgery 2015   INTRAVASCULAR PRESSURE WIRE/FFR STUDY N/A 01/06/2020   Procedure: INTRAVASCULAR PRESSURE WIRE/FFR STUDY;  Surgeon: Lorretta Harp, MD;  Location: [facility];  Service: Cardiovascular;  Laterality: N/A;   LEFT HEART CATH AND CORONARY ANGIOGRAPHY N/A 01/06/2020   Procedure: LEFT HEART CATH AND CORONARY ANGIOGRAPHY;  Surgeon: Lorretta Harp, MD;  Location: [facility];  Service: Cardiovascular;  Laterality: N/A;   LEFT HEART CATH AND CORONARY ANGIOGRAPHY N/A 01/29/2021   Procedure: LEFT HEART CATH AND CORONARY ANGIOGRAPHY;  Surgeon: Jettie Booze, MD;  Location: [facility];  Service: Cardiovascular;  Laterality: N/A;   TOTAL KNEE ARTHROPLASTY Left 12/18/2014   Procedure: TOTAL LEFT KNEE ARTHROPLASTY;  Surgeon: Sydnee Cabal, MD;  Location: [facility];  Service: Orthopedics;  Laterality: Left;   TOTAL KNEE ARTHROPLASTY Right 05/15/2015   Procedure: RIGHT TOTAL KNEE ARTHROPLASTY;  Surgeon: Sydnee Cabal, MD;  Location: [facility];  Service: Orthopedics;  Laterality: Right;   UTERINE FIBROID SURGERY      Current Medications: Outpatient Medications Prior to Visit  Medication Sig Dispense Refill   Accu-Chek Softclix Lancets lancets USE TO CHECK BLOOD SUGAR TWICE DAILY 200 each 3    acetaminophen (TYLENOL) 650 MG CR tablet Take 1,300 mg by mouth at bedtime.     alum & mag hydroxide-simeth (MAALOX MAX) 400-400-40 MG/5ML suspension Take 15 mLs by mouth every 6 (six) hours as needed for indigestion. 355 mL 0   aspirin EC 81 MG tablet Take 1 tablet (81 mg total) by mouth in the morning.     Blood Glucose Monitoring Suppl (ACCU-CHEK GUIDE ME) w/Device KIT 1 each by Does not apply route 2 (two) times daily. E11.9 1 kit 0   carvedilol (COREG) 3.125 MG tablet Take 1 tablet (3.125 mg total) by mouth 2 (two) times daily with a meal. 180 tablet 3   Cholecalciferol (VITAMIN D) 2000 UNITS CAPS Take 2,000  Units by mouth every morning.      clopidogrel (PLAVIX) 75 MG tablet Take 1 tablet (75 mg total) by mouth daily with breakfast. 90 tablet 2   Coenzyme Q10 (COQ10) 200 MG CAPS Take 200 mg by mouth in the morning.     DULoxetine (CYMBALTA) 20 MG capsule Take 40 mg by mouth at bedtime.      furosemide (LASIX) 40 MG tablet TAKE 1 TABLET (40 MG TOTAL) BY MOUTH 3 (THREE) TIMES A WEEK. TUESDAYS, THURSDAYS & SATURDAYS IN THE MORNING 36 tablet 5   glucose blood (ACCU-CHEK GUIDE) test strip Use to check BS 2x a day 200 each 12   insulin glargine, 2 Unit Dial, (TOUJEO MAX SOLOSTAR) 300 UNIT/ML Solostar Pen Inject 18 Units into the skin daily. (Patient taking differently: Inject 12 Units into the skin daily.) 6 mL 3   Insulin Pen Needle (BD PEN NEEDLE NANO U/F) 32G X 4 MM MISC 1 each by Does not apply route daily. 100 each 3   KLOR-CON M20 20 MEQ tablet TAKE 1 TABLET (20 MEQ TOTAL) BY MOUTH 3 (THREE) TIMES A WEEK. TUESDAYS, THURSDAYS & SATURDAYS IN THE MORNING 36 tablet 5   lidocaine (XYLOCAINE) 2 % solution Use as directed 15 mLs in the mouth or throat as needed for mouth pain. 100 mL 0   Multiple Vitamin (MULTIVITAMIN WITH MINERALS) TABS tablet Take 1 tablet by mouth every morning.      nitroGLYCERIN (NITROSTAT) 0.4 MG SL tablet PLACE 1 TABLET (0.4 MG TOTAL) UNDER THE TONGUE EVERY 5 (FIVE) MINUTES X 3  DOSES AS NEEDED FOR CHEST PAIN. 75 tablet 1   Omega-3 Fatty Acids (RA FISH OIL) 1400 MG CPDR Take 1,400 mg by mouth 2 (two) times daily.     pantoprazole (PROTONIX) 40 MG tablet Take 40 mg by mouth daily.     ranolazine (RANEXA) 1000 MG SR tablet Take 1 tablet (1,000 mg total) by mouth 2 (two) times daily. 180 tablet 2   rosuvastatin (CRESTOR) 40 MG tablet Take 1 tablet (40 mg total) by mouth in the morning. 90 tablet 3   saccharomyces boulardii (FLORASTOR) 250 MG capsule Take 250 mg by mouth in the morning.     tirzepatide [facility]) 12.5 MG/0.5ML Pen Inject 12.5 mg into the skin once a week on Thursday. 6 mL 0   trospium (SANCTURA) 20 MG tablet Take 20 mg by mouth 2 (two) times daily.  3   vitamin C (ASCORBIC ACID) 250 MG tablet Take 250 mg by mouth in the morning.     No facility-administered medications prior to visit.     Allergies:   Amoxicillin-pot clavulanate, Aspirin, Colchicine, Diclofenac, Dulaglutide, Empagliflozin, Ezetimibe, Imdur [isosorbide nitrate], Isosorbide, Metformin, Repaglinide, Sitagliptin-metformin hcl, Sulfamethoxazole-trimethoprim, Imipramine, and Tape   Social History   Socioeconomic History   Marital status: Married    Spouse name: Not on file   Number of children: Not on file   Years of education: Not on file   Highest education level: Not on file  Occupational History   Occupation: retired  Tobacco Use   Smoking status: Never   Smokeless tobacco: Never  Vaping Use   Vaping Use: Never used  Substance and Sexual Activity   Alcohol use: Yes    Comment: rare   Drug use: No   Sexual activity: Not on file  Other Topics Concern   Not on file  Social History Narrative   Not on file   Social Determinants of Radio broadcast assistant  Strain: Not on file  Food Insecurity: Not on file  Transportation Needs: Not on file  Physical Activity: Not on file  Stress: Not on file  Social Connections: Not on file    Socially she is married for 39 years.   She has 2 children ages 58 and 78 and 4 grandchildren. She was born in [place].  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  Family History:  The patient's family history includes Cancer in her mother; Congenital heart disease in her son; Diabetes in her maternal grandfather; Heart attack in her father, maternal grandfather, paternal uncle, and sister; Heart disease in her father and mother; Hyperlipidemia in her father and mother; Hypertension in her father and mother; Sleep apnea in her father; Sudden death in her father.   Her father died at age 47 with a heart attack.  Mother died at 67 and had dementia.  She has 2 sisters 1 who underwent CABG revascularization  ROS General: Negative; No fevers, chills, or night sweats;  HEENT: Negative; No changes in vision or hearing, sinus congestion, difficulty swallowing Pulmonary: Negative; No cough, wheezing, shortness of breath, hemoptysis Cardiovascular: Hypertension, CAD, hyperlipidemia GI: Negative; No nausea, vomiting, diarrhea, or abdominal pain GU: Negative; No dysuria, hematuria, or difficulty voiding Musculoskeletal: Negative; no myalgias, joint pain, or weakness Hematologic/Oncology: Negative; no easy bruising, bleeding Endocrine: Positive for diabetes mellitus for at least 10 years Neuro: Negative; no changes in balance, headaches Skin: Negative; No rashes or skin lesions Psychiatric: Negative; No behavioral problems, depression Sleep: OSA with previous snoring, nonrestorative sleep, and occasional nocturia; now on CPAP Other comprehensive 14 point system review is negative.   PHYSICAL EXAM:   VS:  BP 122/64   Pulse 78   Ht 5\' 3"  (1.6 m)   Wt 184 lb 6.4 oz (83.6 kg)   SpO2 98%   BMI 32.66 kg/m     Repeat blood pressure by me was 110/70  Wt Readings from Last 3 Encounters:  10/12/22 184 lb 6.4 oz (83.6 kg)  10/11/22 181 lb 6.4 oz (82.3 kg)  09/27/22 185 lb (83.9 kg)    General: Alert, oriented, no distress.   Since her last office visit with me her weight has reduced from 214 pounds down to 184 pounds today. Skin: normal turgor, no rashes, warm and dry HEENT: Normocephalic, atraumatic. Pupils equal round and reactive to light; sclera anicteric; extraocular muscles intact;  Nose without nasal septal hypertrophy Mouth/Parynx benign; Mallinpatti scale 3 Neck: No JVD, no carotid bruits; normal carotid upstroke Lungs: clear to ausculatation and percussion; no wheezing or rales Chest wall: without tenderness to palpitation Heart: PMI not displaced, RRR, s1 s2 normal, 1/6 systolic murmur, no diastolic murmur, no rubs, gallops, thrills, or heaves Abdomen: soft, nontender; no hepatosplenomehaly, BS+; abdominal aorta nontender and not dilated by palpation. Back: no CVA tenderness Pulses 2+ Musculoskeletal: full range of motion, normal strength, no joint deformities Extremities: no clubbing cyanosis or edema, Homan's sign negative  Neurologic: grossly nonfocal; Cranial nerves grossly wnl Psychologic: Normal mood and affect   Studies/Labs Reviewed:   September 28, 2020 ECG (independently read by me): NSR aT 71  Recent Labs:    Latest Ref Rng & Units 11/13/2021    7:33 PM 01/26/2021    2:27 PM 05/12/2020    3:57 PM  BMP  Glucose 70 - 99 mg/dL 134  99  112   BUN 8 - 23 mg/dL 25  28  43   Creatinine 0.44 - 1.00 mg/dL  1.09  1.02  1.17   BUN/Creat Ratio 12 - 28  27  37   Sodium 135 - 145 mmol/L 137  140  140   Potassium 3.5 - 5.1 mmol/L 3.9  4.6  3.6   Chloride 98 - 111 mmol/L 100  103  100   CO2 22 - 32 mmol/L 27  23  24    Calcium 8.9 - 10.3 mg/dL 10.1  9.9  10.5         Latest Ref Rng & Units 04/21/2020   12:09 PM  Hepatic Function  Total Protein 6.0 - 8.5 g/dL 7.5   Albumin 3.7 - 4.7 g/dL 4.8   AST 0 - 40 IU/L 25   ALT 0 - 32 IU/L 25   Alk Phosphatase 44 - 121 IU/L 104   Total Bilirubin 0.0 - 1.2 mg/dL 0.5        Latest Ref Rng & Units 11/13/2021    7:33 PM 01/26/2021    2:27 PM  01/07/2020    4:43 AM  CBC  WBC 4.0 - 10.5 K/uL 7.5  7.0  7.1   Hemoglobin 12.0 - 15.0 g/dL 14.0  13.8  13.2   Hematocrit 36.0 - 46.0 % 41.3  41.6  39.1   Platelets 150 - 400 K/uL 243  229  225    Lab Results  Component Value Date   MCV 93.0 11/13/2021   MCV 92 01/26/2021   MCV 90.3 01/07/2020   Lab Results  Component Value Date   TSH 1.71 05/30/2018   Lab Results  Component Value Date   HGBA1C 5.5 08/30/2022     BNP No results found for: "BNP"  ProBNP No results found for: "PROBNP"   Lipid Panel     Component Value Date/Time   CHOL 134 12/16/2020 0957   TRIG 142 12/16/2020 0957   HDL 41 12/16/2020 0957   CHOLHDL 3.3 12/16/2020 0957   LDLCALC 68 12/16/2020 0957   LABVLDL 25 12/16/2020 0957     RADIOLOGY: No results found.   Additional studies/ records that were reviewed today include:   01/31/2020 CLINICAL INFORMATION Sleep Study Type: NPSG   Indication for sleep study: Snoring   Epworth Sleepiness Score: 8   SLEEP STUDY TECHNIQUE As per the AASM Manual for the Scoring of Sleep and Associated Events v2.3 (April 2016) with a hypopnea requiring 4% desaturations.   The channels recorded and monitored were frontal, central and occipital EEG, electrooculogram (EOG), submentalis EMG (chin), nasal and oral airflow, thoracic and abdominal wall motion, anterior tibialis EMG, snore microphone, electrocardiogram, and pulse oximetry.   MEDICATIONS acetaminophen (TYLENOL ARTHRITIS PAIN) 650 MG CR tablet aspirin EC 81 MG tablet Blood Glucose Monitoring Suppl (ACCU-CHEK GUIDE ME) w/Device KIT carvedilol (COREG) 3.125 MG tablet Cholecalciferol (VITAMIN D) 2000 UNITS CAPS clopidogrel (PLAVIX) 75 MG tablet Coenzyme Q10 (COQ10) 200 MG CAPS diclofenac sodium (VOLTAREN) 1 % GEL DULoxetine (CYMBALTA) 20 MG capsule furosemide (LASIX) 40 MG tablet glucose blood (ACCU-CHEK GUIDE) test strip insulin glargine, 2 Unit Dial, (TOUJEO MAX SOLOSTAR) 300 UNIT/ML Solostar  Pen Insulin Pen Needle (PEN NEEDLES) 32G X 5 MM MISC Multiple Vitamin (MULTIVITAMIN WITH MINERALS) TABS tablet nitroGLYCERIN (NITROSTAT) 0.4 MG SL tablet Omega-3 Fatty Acids (RA FISH OIL) 1400 MG CPDR pantoprazole (PROTONIX) 40 MG tablet potassium chloride SA (KLOR-CON) 20 MEQ tablet rosuvastatin (CRESTOR) 20 MG tablet saccharomyces boulardii (FLORASTOR) 250 MG capsule trospium (SANCTURA) 20 MG tablet valsartan-hydrochlorothiazide (DIOVAN-HCT) 160-12.5 MG tablet vitamin C (ASCORBIC ACID) 250 MG tablet Medications self-administered  by patient taken the night of the study : CARVEDILOL, FISH OIL, trospium, TYLENOL ALLERGY SINUS, DULOXETINE   SLEEP ARCHITECTURE The study was initiated at 10:54:31 PM and ended at 5:02:37 AM.   Sleep onset time was 61.9 minutes and the sleep efficiency was 60.0%%. The total sleep time was 220.7 minutes.   Stage REM latency was N/A minutes.   The patient spent 1.4%% of the night in stage N1 sleep, 98.6%% in stage N2 sleep, 0.0%% in stage N3 and 0% in REM.   Alpha intrusion was absent.   Supine sleep was 96.60%.   RESPIRATORY PARAMETERS The overall apnea/hypopnea index (AHI) was 18.2 per hour. The respiratory disturbance index (RDI) is 23.1/h. There were 1 total apneas, including 1 obstructive, 0 central and 0 mixed apneas. There were 66 hypopneas and 18 RERAs.   The AHI during Stage REM sleep was N/A per hour.   AHI while supine was 15.8 per hour.   The mean oxygen saturation was 92.6%. The minimum SpO2 during sleep was 84.0%.   Moderate snoring was noted during this study.   CARDIAC DATA The 2 lead EKG demonstrated sinus rhythm. The mean heart rate was 76.1 beats per minute. Other EKG findings include: isolated 5 beat burst of atrial tachycardia.   LEG MOVEMENT DATA The total PLMS were 0 with a resulting PLMS index of 0.0. Associated arousal with leg movement index was 0.0 .   IMPRESSIONS - Modederate obstructive sleep apnea overall (AHI  18.2/h; RDI 23.1/h) however REM sleep was not present and the overall assessment may be underestimated if REM sleep had occurred. - No significant central sleep apnea occurred during this study (CAI = 0.0/h). - Mild oxygen desaturation to a nadir of 84.0%. - The patient snored with moderate snoring volume. - No cardiac abnormalities were noted during this study. - Clinically significant periodic limb movements did not occur during sleep. No significant associated arousals.   DIAGNOSIS - Obstructive Sleep Apnea (G47.33) - Nocturnal Hypoxemia (G47.36)   RECOMMENDATIONS - Therapeutic CPAP titration to determine optimal pressure required to alleviate sleep disordered breathing. - Effort should be made to optimize nasal and oropharyngeal patency.  - Avoid alcohol, sedatives and other CNS depressants that may worsen sleep apnea and disrupt normal sleep architecture. - Sleep hygiene should be reviewed to assess factors that may improve sleep quality. - Weight management (BMI 41) and regular exercise should be initiated or continued if appropriate.    CPAP TITRATION 04/24/2020 RESPIRATORY PARAMETERS Optimal PAP Pressure (cm):   AHI at Optimal Pressure (/hr):            N/A Overall Minimal O2 (%):         80.0     Supine % at Optimal Pressure (%):    N/A Minimal O2 at Optimal Pressure (%): 80.0        SLEEP ARCHITECTURE The study was initiated at 9:45:26 PM and ended at 4:47:58 AM.   Sleep onset time was 107.3 minutes and the sleep efficiency was 40.1%%. The total sleep time was 169.5 minutes.   The patient spent 5.6%% of the night in stage N1 sleep, 94.4%% in stage N2 sleep, 0.0%% in stage N3 and 0% in REM.Stage REM latency was N/A minutes   Wake after sleep onset was 145.7. Alpha intrusion was absent. Supine sleep was 67.55%.   CARDIAC DATA The 2 lead EKG demonstrated sinus rhythm. The mean heart rate was 74.3 beats per minute. Other EKG findings include: None.   LEG MOVEMENT  DATA The  total Periodic Limb Movements of Sleep (PLMS) were 0. The PLMS index was 0.0. A PLMS index of <15 is considered normal in adults.   IMPRESSIONS - CPAP was initiated at 5 cm and was titrated to 14 cm of water; AHI 5.6/h with O2 nadir 85%. REM sleep did not occur throughout the study.  An optimal PAP pressure could not be selected for this patient based on the available study data. - Central sleep apnea was not noted during this titration (CAI = 0.0/h). - Severe oxygen desaturation to a nadir of 80.0% at 11 cm of water. - Reduced sleep efficiency at only 40.1%. - The patient snored with soft snoring volume during this titration study. - No cardiac abnormalities were observed during this study. - Clinically significant periodic limb movements were not noted during this study. Arousals associated with PLMs were rare.   DIAGNOSIS - Obstructive Sleep Apnea (G47.33)   RECOMMENDATIONS - Recommend an initial trial of CPAP Auto with EPR of 3 at 11- 20 cm H2O and heated humidification.  - Effort should be made to optimize nasal and oropharyngeal patency.  - Avoid alcohol, sedatives and other CNS depressants that may worsen sleep apnea and disrupt normal sleep architecture. - Sleep hygiene should be reviewed to assess factors that may improve sleep quality. - Weight management and regular exercise should be initiated or continued. - Recommend a download in 30 days and sleep clinic evaluation after 4 weeks of therapy.,    ASSESSMENT:    1. OSA (obstructive sleep apnea)   2. Coronary artery disease involving native coronary artery of native heart without angina pectoris   3. Essential hypertension   4. Type 2 diabetes mellitus with other specified complication, with long-term current use of insulin (HCC)   5. Class 1 obesity due to excess calories with serious comorbidity and body mass index (BMI) of 32.0 to 32.9 in adult      PLAN:  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  At that time, she was unable to achieve any REM sleep. On her CPAP titration she also did not achieve any REM sleep.  Her CPAP set up date was June 18, 2020.  When I saw her for my initial evaluation in February 2022 compliance was excellent and she was averaging 7 hours of CPAP use per night.  Her CPAP unit was set at a range of 8 to 18 cm of water.  Her 95th percentile pressure is 15 and maximum average pressure 16.7 cm of water.  AHI is excellent at 1.4.  At her initial evaluation I had a long discussion with her and reviewed the effects of untreated sleep apnea with reference to her cardiovascular health.  I discussed its effects on hypertension, potential nocturnal arrhythmias, nocturnal ischemia potentially resulting from hypoxemia as well as its effects on glucose, GERD, and inflammation.  Presently, she continues to do well and is compliant with CPAP on her download from January 15 through September 15, 2022 with 100% use.  Her pressure range is now at 10 to 20 cm and AHI was excellent at 0.8.  Her 95th percentile pressure was 15.7.  However, since mid February, she had developed blisters on the bridge of her nose and has  resolved her download from February 12 through October 11, 2022 only showed 17% of days of usage due to the irritation of her nasal bridge.  I have suggested she switch to a ResMed AirFit N30i mask rather than her fullface mask and if a full fast was needed she would benefit from the F30i mask.  Her Epworth scale score calculated today was borderline elevated at 10 most likely due to her reduced CPAP use over the past month.  I commended her on her weight loss.  Her blood pressure today is stable and on repeat by me was 110/70.  S She continues  to be on carvedilol 3.125 mg twice a day, furosemide 40 mg 3 days/week, and is on ranolazine 1000 mg twice a day.  She is not having any anginal symptoms. She is diabetic on insulin.  She continues to be on rosuvastatin for hyperlipidemia.  She now sees Dr. Buford Dresser for cardiology care at [facility].  She is followed by Dr. Cruzita Lederer for endocrine.  I will see her in 1 year for follow-up evaluation.   Medication Adjustments/Labs and Tests Ordered: Current medicines are reviewed at length with the patient today.  Concerns regarding medicines are outlined above.  Medication changes, Labs and Tests ordered today are listed in the Patient Instructions below. Patient Instructions  Medication Instructions:   Your physician recommends that you continue on your current medications as directed. Please refer to the Current Medication list given to you today.  *If you need a refill on your cardiac medications before your next appointment, please call your pharmacy*  Lab Work: NONE ordered at this time of appointment   If you have labs (blood work) drawn today and your tests are completely normal, you will receive your results only by: [place] (if you have MyChart) OR A paper copy in the mail If you have any lab test that is abnormal or we need to change your treatment, we will call you to review the results.  Testing/Procedures: NONE ordered at this time of appointment   Follow-Up: At [facility], you and your health needs are our priority.  As part of our continuing mission to provide you with exceptional heart care, we have created designated Provider Care Teams.  These Care Teams include your primary Cardiologist (physician) and Advanced Practice Providers (APPs -  Physician Assistants and Nurse Practitioners) who all work together to provide you with the care you need, when you need it.  Your next appointment:   1 year(s)  Provider:   Troy Sine, MD      Other Instructions     Signed, Pamela Majestic, MD  10/17/2022 9:29 AM    [place] [address] [place], [place], [place]  [address] Phone: [phone]

## 2022-10-13 ENCOUNTER — Ambulatory Visit (INDEPENDENT_AMBULATORY_CARE_PROVIDER_SITE_OTHER): Payer: Medicare PPO | Admitting: Adult Health

## 2022-10-14 DIAGNOSIS — G4733 Obstructive sleep apnea (adult) (pediatric): Secondary | ICD-10-CM | POA: Diagnosis not present

## 2022-10-17 ENCOUNTER — Encounter: Payer: Self-pay | Admitting: Cardiovascular Disease

## 2022-10-19 ENCOUNTER — Other Ambulatory Visit (HOSPITAL_BASED_OUTPATIENT_CLINIC_OR_DEPARTMENT_OTHER): Payer: Self-pay

## 2022-10-24 ENCOUNTER — Encounter (INDEPENDENT_AMBULATORY_CARE_PROVIDER_SITE_OTHER): Payer: Self-pay | Admitting: Adult Health

## 2022-10-24 ENCOUNTER — Other Ambulatory Visit (HOSPITAL_BASED_OUTPATIENT_CLINIC_OR_DEPARTMENT_OTHER): Payer: Self-pay

## 2022-10-24 ENCOUNTER — Ambulatory Visit (INDEPENDENT_AMBULATORY_CARE_PROVIDER_SITE_OTHER): Payer: Medicare PPO | Admitting: Adult Health

## 2022-10-24 VITALS — BP 114/69 | HR 72 | Temp 97.7°F | Ht 63.0 in | Wt 179.0 lb

## 2022-10-24 DIAGNOSIS — Z7985 Long-term (current) use of injectable non-insulin antidiabetic drugs: Secondary | ICD-10-CM | POA: Diagnosis not present

## 2022-10-24 DIAGNOSIS — E669 Obesity, unspecified: Secondary | ICD-10-CM

## 2022-10-24 DIAGNOSIS — Z794 Long term (current) use of insulin: Secondary | ICD-10-CM

## 2022-10-24 DIAGNOSIS — E1169 Type 2 diabetes mellitus with other specified complication: Secondary | ICD-10-CM

## 2022-10-24 DIAGNOSIS — G4733 Obstructive sleep apnea (adult) (pediatric): Secondary | ICD-10-CM | POA: Diagnosis not present

## 2022-10-24 DIAGNOSIS — Z6831 Body mass index (BMI) 31.0-31.9, adult: Secondary | ICD-10-CM

## 2022-10-24 NOTE — Progress Notes (Signed)
WEIGHT SUMMARY AND BIOMETRICS  Vitals Temp: 97.7 F (36.5 C) BP: 114/69 Pulse Rate: 72 SpO2: 99 %   Anthropometric Measurements Height: 5\' 3"  (1.6 m) Weight: 179 lb (81.2 kg) BMI (Calculated): 31.72 Weight at Last Visit: 181lb Weight Lost Since Last Visit: 2lb Weight Gained Since Last Visit: 0 Starting Weight: 227lb Total Weight Loss (lbs): 48 lb (21.8 kg)   Body Composition  Body Fat %: 44.2 % Fat Mass (lbs): 79.2 lbs Muscle Mass (lbs): 94.8 lbs Total Body Water (lbs): 73 lbs Visceral Fat Rating : 13   Other Clinical Data Fasting: No Labs: No Today's Visit #: 1 Starting Date: 03/17/20    Chief Complaint:   OBESITY Pamela Love is here to discuss her progress with her obesity treatment plan. She is on the the Category 2 Plan and states she is following her eating plan approximately 65 % of the time.  She states she is exercising Friday night dancing and gym exercises 60+ minutes 3 times per week.   Interim History:  Her current exercise routine: Gym Exercise 1-2 x week: Walk one mile which take approx 25 mins Stationary Bike 6 mins [employer]: 5-6 mins per machine, focus on upper/lower body muscle group  Subjective:   1. Type 2 diabetes mellitus with obesity (HCC) Lab Results  Component Value Date   HGBA1C 5.5 08/30/2022   HGBA1C 6.0 (H) 06/06/2022   HGBA1C 6.1 (A) 02/25/2022   Dr. Orpah Greek manages Toujeo 12 U QD HWW managed Mounjaro 12.5mg  once weekly injection Denies mass in neck, dysphagia, or persistent hoarseness. Last week, Pamela Love reports consuming 1/4 gallon of "Creamy Chocolate" ice cream, then 2 days later experienced loose stools and significant flatulence. She continues to experience loose stools. She has continued her daily OTC Miralax despite loose stools. CBG readings since last OV: Fasting:  138, 174, 131, 144  PP: 90, 103  2. OSA (obstructive sleep apnea) Recently changed from nasal to full face mask on CPAP. She  reports poor seal and frequent waking. Machine records 8 hours of sleep with "good seal". She is concerned due to this discrepency. She feels that her poor sleep has contributed to increase in CBG levels.  Assessment/Plan:   1. Type 2 diabetes mellitus with obesity ([place]) Continue with current antidiabetic regime.  2. OSA (obstructive sleep apnea) F/u with CPAP equipment specialist  3. Obesity, current BMI 31.72  Pamela Love is currently in the action stage of change. As such, her goal is to continue with weight loss efforts. She has agreed to the Category 2 Plan.   Exercise goals: No exercise has been prescribed at this time. Older adults should follow the adult guidelines. When older adults cannot meet the adult guidelines, they should be as physically active as their abilities and conditions will allow.  Older adults should do exercises that maintain or improve balance if they are at risk of falling.  Older adults with chronic conditions should understand whether and how their conditions affect their ability to do regular physical activity safely.  Behavioral modification strategies: increasing lean protein intake, decreasing simple carbohydrates, increasing vegetables, increasing water intake, decreasing liquid calories, no skipping meals, meal planning and cooking strategies, keeping healthy foods in the home, better snacking choices, and planning for success.  Pamela Love has agreed to follow-up with our clinic in 2 weeks. She was informed of the importance of frequent follow-up visits to maximize her success with intensive lifestyle modifications for her multiple health conditions.   Objective:  Blood pressure 114/69, pulse 72, temperature 97.7 F (36.5 C), height 5\' 3"  (1.6 m), weight 179 lb (81.2 kg), SpO2 99 %. Body mass index is 31.71 kg/m.  General: Cooperative, alert, well developed, in no acute distress. HEENT: Conjunctivae and lids unremarkable. Cardiovascular: Regular rhythm.   Lungs: Normal work of breathing. Neurologic: No focal deficits.   Lab Results  Component Value Date   CREATININE 1.09 (H) 11/13/2021   BUN 25 (H) 11/13/2021   NA 137 11/13/2021   K 3.9 11/13/2021   CL 100 11/13/2021   CO2 27 11/13/2021   Lab Results  Component Value Date   ALT 25 04/21/2020   AST 25 04/21/2020   ALKPHOS 104 04/21/2020   BILITOT 0.5 04/21/2020   Lab Results  Component Value Date   HGBA1C 5.5 08/30/2022   HGBA1C 6.0 (H) 06/06/2022   HGBA1C 6.1 (A) 02/25/2022   HGBA1C 5.8 (A) 10/14/2021   HGBA1C 6.1 (A) 07/12/2021   No results found for: "INSULIN" Lab Results  Component Value Date   TSH 1.71 05/30/2018   Lab Results  Component Value Date   CHOL 134 12/16/2020   HDL 41 12/16/2020   LDLCALC 68 12/16/2020   TRIG 142 12/16/2020   CHOLHDL 3.3 12/16/2020   Lab Results  Component Value Date   VD25OH 64.2 06/06/2022   VD25OH 63.9 04/21/2020   Lab Results  Component Value Date   WBC 7.5 11/13/2021   HGB 14.0 11/13/2021   HCT 41.3 11/13/2021   MCV 93.0 11/13/2021   PLT 243 11/13/2021   No results found for: "IRON", "TIBC", "FERRITIN"  Attestation Statements:   Reviewed by clinician on day of visit: allergies, medications, problem list, medical history, surgical history, family history, social history, and previous encounter notes.  Time spent on visit including pre-visit chart review and post-visit care and charting was 29 minutes.   I have reviewed the above documentation for accuracy and completeness, and I agree with the above. -  Pamela Love d. Pamela Agostinelli, NP-C

## 2022-10-27 ENCOUNTER — Other Ambulatory Visit (HOSPITAL_BASED_OUTPATIENT_CLINIC_OR_DEPARTMENT_OTHER): Payer: Self-pay

## 2022-10-27 ENCOUNTER — Other Ambulatory Visit: Payer: Self-pay | Admitting: *Deleted

## 2022-10-27 ENCOUNTER — Telehealth: Payer: Self-pay | Admitting: *Deleted

## 2022-10-27 MED ORDER — TIRZEPATIDE 10 MG/0.5ML ~~LOC~~ SOAJ
10.0000 mg | SUBCUTANEOUS | 5 refills | Status: DC
  Filled 2022-10-27: qty 2, 28d supply, fill #0

## 2022-10-27 NOTE — Telephone Encounter (Signed)
Pt called--stated Mounjaro 12.5 not available at the pharmacy, but have 10 mg/ml available at [facility]. Pt requesting for Rx mounjaro 10 mg/ml if possible. Please advise

## 2022-10-27 NOTE — Telephone Encounter (Signed)
sent 

## 2022-11-07 ENCOUNTER — Ambulatory Visit: Payer: Medicare PPO

## 2022-11-07 ENCOUNTER — Encounter (INDEPENDENT_AMBULATORY_CARE_PROVIDER_SITE_OTHER): Payer: Self-pay

## 2022-11-07 ENCOUNTER — Ambulatory Visit (INDEPENDENT_AMBULATORY_CARE_PROVIDER_SITE_OTHER): Payer: Medicare PPO | Admitting: Family Medicine

## 2022-11-07 NOTE — Progress Notes (Incomplete)
Pamela Love, D.O.  ABFM, ABOM Specializing in Clinical Bariatric Medicine  Office located at: [address] [place], [place]  [address]     Assessment and Plan:   No orders of the defined types were placed in this encounter.   There are no discontinued medications.   No orders of the defined types were placed in this encounter.    ***   TREATMENT PLAN FOR OBESITY:  Recommended Dietary Goals Pamela Love is currently in the action stage of change. As such, her goal is to continue weight management plan. She has agreed to {continue/change:29241} {MWMwtlossportion/plan2:23431}.  Behavioral Intervention Additional resources provided today: {weightresources:29185} Evidence-based interventions for health behavior change were utilized today including the discussion of self monitoring techniques, problem-solving barriers and SMART goal setting techniques.   Regarding patient's less desirable eating habits and patterns, we employed the technique of small changes.  Pt will specifically work on: *** for next visit.    Recommended Physical Activity Goals Pamela Love has been advised to work up to 150 minutes of moderate intensity aerobic activity a week and strengthening exercises 2-3 times per week for cardiovascular health, weight loss maintenance and preservation of muscle mass.  She has agreed to {EMEXERCISE:28847::"Continue current level of physical activity "}  Pharmacotherapy We discussed various medication options to help Pamela Love with her weight loss efforts and we both agreed to ***.   FOLLOW UP: No follow-ups on file.*** She was informed of the importance of frequent follow up visits to maximize her success with intensive lifestyle modifications for her multiple health conditions.  Subjective:   Chief complaint: Obesity Pamela Love is here to discuss her progress with her obesity treatment plan. She is on the {MWMwtlossportion/plan2:23431} and states she is following her eating  plan approximately ***% of the time. She states she is exercising *** minutes *** days per week.  Interval History:  Pamela Love is here for a follow up office visit. We reviewed her meal plan and all questions were answered. Patient's food recall appears to be accurate and consistent with what is on plan when she is following it. When eating on plan, her hunger and cravings are well controlled.     Since last office visit she ***  Pharmacotherapy for weight loss: She {srtis (Optional):29129} currently taking {srtpreviousweightlossmeds (Optional):29124} for medical weight loss.  Denies side effects.    Review of Systems:  Pertinent positives were addressed with patient today.  Weight Summary and Biometrics   No data recorded No data recorded ***  No data recorded No data recorded No data recorded No data recorded  Objective:   PHYSICAL EXAM:  There were no vitals taken for this visit. There is no height or weight on file to calculate BMI.  General: Well Developed, well nourished, and in no acute distress.  HEENT: Normocephalic, atraumatic Skin: Warm and dry, cap RF less 2 sec, good turgor Chest:  Normal excursion, shape, no gross abn Respiratory: speaking in full sentences, no conversational dyspnea NeuroM-Sk: Ambulates w/o assistance, moves * 4 Psych: A and O *3, insight good, mood-full  DIAGNOSTIC DATA REVIEWED:  BMET    Component Value Date/Time   NA 137 11/13/2021 1933   NA 140 01/26/2021 1427   K 3.9 11/13/2021 1933   CL 100 11/13/2021 1933   CO2 27 11/13/2021 1933   GLUCOSE 134 (H) 11/13/2021 1933   BUN 25 (H) 11/13/2021 1933   BUN 28 (H) 01/26/2021 1427   CREATININE 1.09 (H) 11/13/2021 1933  CALCIUM 10.1 11/13/2021 1933   GFRNONAA 54 (L) 11/13/2021 1933   GFRAA 54 (L) 05/12/2020 1557   Lab Results  Component Value Date   HGBA1C 5.5 08/30/2022   HGBA1C 6 1 02/09/2015   No results found for: "INSULIN" Lab Results  Component Value Date    TSH 1.71 05/30/2018   CBC    Component Value Date/Time   WBC 7.5 11/13/2021 1933   RBC 4.44 11/13/2021 1933   HGB 14.0 11/13/2021 1933   HGB 13.8 01/26/2021 1427   HCT 41.3 11/13/2021 1933   HCT 41.6 01/26/2021 1427   PLT 243 11/13/2021 1933   PLT 229 01/26/2021 1427   MCV 93.0 11/13/2021 1933   MCV 92 01/26/2021 1427   MCH 31.5 11/13/2021 1933   MCHC 33.9 11/13/2021 1933   RDW 12.1 11/13/2021 1933   RDW 13.2 01/26/2021 1427   Iron Studies No results found for: "IRON", "TIBC", "FERRITIN", "IRONPCTSAT" Lipid Panel     Component Value Date/Time   CHOL 134 12/16/2020 0957   TRIG 142 12/16/2020 0957   HDL 41 12/16/2020 0957   CHOLHDL 3.3 12/16/2020 0957   LDLCALC 68 12/16/2020 0957   Hepatic Function Panel     Component Value Date/Time   PROT 7.5 04/21/2020 1209   ALBUMIN 4.8 (H) 04/21/2020 1209   AST 25 04/21/2020 1209   ALT 25 04/21/2020 1209   ALKPHOS 104 04/21/2020 1209   BILITOT 0.5 04/21/2020 1209      Component Value Date/Time   TSH 1.71 05/30/2018 0000   Nutritional Lab Results  Component Value Date   VD25OH 64.2 06/06/2022   VD25OH 63.9 04/21/2020    Attestations:   Reviewed by clinician on day of visit: allergies, medications, problem list, medical history, surgical history, family history, social history, and previous encounter notes.   Patient was in the office today and time spent on visit including pre-visit chart review and post-visit care/coordination of care and electronic medical record documentation was *** minutes. 50% of the time was in face to face counseling of this patient's medical condition(s) and providing education on treatment options to include the first-line treatment of diet and lifestyle modification.   I,Special Puri,acting as a Neurosurgeon for [employer], DO.,have documented all relevant documentation on the behalf of Pamela Lot, DO,as directed by  Pamela Lot, DO while in the presence of Pamela Lot, DO.   I,  Pamela Lot, DO, have reviewed all documentation for this visit. The documentation on 11/07/22 for the exam, diagnosis, procedures, and orders are all accurate and complete.

## 2022-11-12 ENCOUNTER — Other Ambulatory Visit (HOSPITAL_BASED_OUTPATIENT_CLINIC_OR_DEPARTMENT_OTHER): Payer: Self-pay

## 2022-11-15 ENCOUNTER — Ambulatory Visit
Admission: RE | Admit: 2022-11-15 | Discharge: 2022-11-15 | Disposition: A | Discharge status: Home / Self Care | Payer: Medicare PPO | Source: Ambulatory Visit | Attending: Family Medicine | Admitting: Family Medicine

## 2022-11-15 DIAGNOSIS — Z1231 Encounter for screening mammogram for malignant neoplasm of breast: Secondary | ICD-10-CM

## 2022-11-21 ENCOUNTER — Other Ambulatory Visit (HOSPITAL_BASED_OUTPATIENT_CLINIC_OR_DEPARTMENT_OTHER): Payer: Self-pay

## 2022-11-21 ENCOUNTER — Ambulatory Visit (INDEPENDENT_AMBULATORY_CARE_PROVIDER_SITE_OTHER): Payer: Medicare PPO | Admitting: Family Medicine

## 2022-11-21 ENCOUNTER — Encounter (INDEPENDENT_AMBULATORY_CARE_PROVIDER_SITE_OTHER): Payer: Self-pay | Admitting: Family Medicine

## 2022-11-21 VITALS — BP 117/74 | HR 67 | Temp 98.0°F | Ht 63.0 in | Wt 181.0 lb

## 2022-11-21 DIAGNOSIS — Z6831 Body mass index (BMI) 31.0-31.9, adult: Secondary | ICD-10-CM | POA: Diagnosis not present

## 2022-11-21 DIAGNOSIS — I152 Hypertension secondary to endocrine disorders: Secondary | ICD-10-CM | POA: Diagnosis not present

## 2022-11-21 DIAGNOSIS — E1169 Type 2 diabetes mellitus with other specified complication: Secondary | ICD-10-CM | POA: Diagnosis not present

## 2022-11-21 DIAGNOSIS — Z7985 Long-term (current) use of injectable non-insulin antidiabetic drugs: Secondary | ICD-10-CM

## 2022-11-21 MED ORDER — TIRZEPATIDE 12.5 MG/0.5ML ~~LOC~~ SOAJ
12.5000 mg | SUBCUTANEOUS | 0 refills | Status: DC
  Filled 2022-11-21 – 2022-12-05 (×3): qty 2, 28d supply, fill #0

## 2022-11-21 NOTE — Progress Notes (Signed)
Pamela Love, D.O.  ABFM, ABOM Specializing in Clinical Bariatric Medicine  Office located at: [address] [place], [place]  [address]     Assessment and Plan:   No orders of the defined types were placed in this encounter.   Medications Discontinued During This Encounter  Medication Reason   tirzepatide [facility]) 12.5 MG/0.5ML Pen Reorder     Meds ordered this encounter  Medications   tirzepatide (MOUNJARO) 12.5 MG/0.5ML Pen    Sig: Inject 12.5 mg into the skin once a week on Thursday.    Dispense:  2 mL    Refill:  0    Type 2 diabetes mellitus with obesity Assessment: Condition is stable.  Lab Results  Component Value Date   HGBA1C 5.5 08/30/2022   HGBA1C 6.0 (H) 06/06/2022   HGBA1C 6.1 (A) 02/25/2022  Patient is compliant with Toujeo 12 U QD as recommended by Dr.Gerghe and Mounjaro 10 mg weekly. Patient endorses that she is finishing the Mounjaro 10 mg dosage and will then start the 12.5 mg dose.  Her hunger and cravings are controlled. Patient states that when she was down to the [facility] 10 mg, her sugars increased to the 150s in the morning and night. Normally, it is the 120s.   Plan: Continue with Toujeo as recommended by specialist. Start Mounjaro 12.5 mg weekly.  - Continue monitoring blood sugars regularly at home.  Continue her prudent nutritional plan that is low in simple carbohydrates, saturated fats and trans fats to goal of 5-10% weight loss to achieve significant health benefits.  Pt encouraged to continually advance exercise and cardiovascular fitness as tolerated throughout weight loss journey.    Hypertension due to endocrine disorder Assessment: Condition is stable.  Last 3 blood pressure readings in our office are as follows: BP Readings from Last 3 Encounters:  11/21/22 117/74  10/24/22 114/69  10/12/22 122/64  Patient endorses that her blood pressure is usually 117/70s at home, however on one occasion she endorses that it dropped  to 99/60. On that day, she did have an episode of dizziness and states she had a headache and didn't feel well for 24-48 hrs. Patient is compliant with Coreg 3.125 mg daily  and Lasix 40 mg 3 times a week, denies any side effects. Today she is asymptomatic. No concerns.   Plan:Continue with meds as recommended by specialist.  - Ambulatory blood pressure monitoring encouraged.  Reminded patient that if they ever feel poorly in any way, to check their blood pressure and pulse as well. If low blood pressure and dizziness continues, I recommended her to call her specialist about possible dose changes of medication/.  - We will continue to monitor closely alongside PCP/ specialists.  Pt reminded to also f/up with those individuals as instructed by them.  -Continue her prudent nutritional plan that is low in simple carbohydrates, saturated fats and trans fats to goal of 5-10% weight loss to achieve significant health benefits.  Pt encouraged to continually advance exercise and cardiovascular fitness as tolerated throughout weight loss journey.   TREATMENT PLAN FOR OBESITY: Obesity, current BMI 31.72 Morbid obesity (HCC)-START BMI 40.21/DATE 03/17/20 Assessment: Condition is improving, but not optimized. Biometric data collected today, was reviewed with patient.  Fat mass has increased by 4.8lb. Muscle mass has decreased by 2.6lb. Total body water has increased by 5.4lb.   Plan:  Jolan is currently in the action stage of change. As such, her goal is to continue weight management plan. [place] will  work on healthier eating habits and continue the Category 2 meal plan   Behavioral Intervention Additional resources provided today: patient declined Evidence-based interventions for health behavior change were utilized today including the discussion of self monitoring techniques, problem-solving barriers and SMART goal setting techniques.   Regarding patient's less desirable eating habits and patterns, we  employed the technique of small changes.  Pt will specifically work on: continue adherence to prudent nutritional plan and exercise regiment for next visit.    Recommended Physical Activity Goals [place] has been advised to work up to 150 minutes of moderate intensity aerobic activity a week and strengthening exercises 2-3 times per week for cardiovascular health, weight loss maintenance and preservation of muscle mass.  She has agreed to Continue current level of physical activity    FOLLOW UP: Return in about 3 weeks (around 12/12/2022). She was informed of the importance of frequent follow up visits to maximize her success with intensive lifestyle modifications for her multiple health conditions.   Subjective:   Chief complaint: Obesity Zephaniah is here to discuss her progress with her obesity treatment plan. She is on the the Category 2 Plan and states she is following her eating plan approximately 65% of the time. She states she is doing dance/treadmill/weights 60  minutes 2 day per week.  Interval History:  Sophonie Goforth is here for a follow up office visit. Since last office visit she endorses that she has not been eating completely on plan because of 2 birthday parties that she attended. Apart from those two days, she has been mostly following the meal plan. Patient has a beach trip in the end of May. When eating on plan, her hunger and cravings are well controlled.     Pharmacotherapy for weight loss: She is currently taking  Mounjaro  for medical weight loss. Denies side effects.    Review of Systems:  Pertinent positives were addressed with patient today.   Weight Summary and Biometrics   Weight Lost Since Last Visit: 0  Weight Gained Since Last Visit: 2lb    Vitals Temp: 98 F (36.7 C) BP: 117/74 Pulse Rate: 67 SpO2: 99 %   Anthropometric Measurements Height: 5\' 3"  (1.6 m) Weight: 181 lb (82.1 kg) BMI (Calculated): 32.07 Weight at Last Visit: 179lb Weight  Lost Since Last Visit: 0 Weight Gained Since Last Visit: 2lb Starting Weight: 227lb Total Weight Loss (lbs): 46 lb (20.9 kg) Peak Weight: 250lb   Body Composition  Body Fat %: 46.4 % Fat Mass (lbs): 84 lbs Muscle Mass (lbs): 92.2 lbs Total Body Water (lbs): 78.4 lbs Visceral Fat Rating : 14   Other Clinical Data Fasting: no Labs: no Today's Visit #: 57 Starting Date: 03/17/20     Objective:   PHYSICAL EXAM: Blood pressure 117/74, pulse 67, temperature 98 F (36.7 C), height 5\' 3"  (1.6 m), weight 181 lb (82.1 kg), SpO2 99 %. Body mass index is 32.06 kg/m.  General: Well Developed, well nourished, and in no acute distress.  HEENT: Normocephalic, atraumatic Skin: Warm and dry, cap RF less 2 sec, good turgor Chest:  Normal excursion, shape, no gross abn Respiratory: speaking in full sentences, no conversational dyspnea NeuroM-Sk: Ambulates w/o assistance, moves * 4 Psych: A and O *3, insight good, mood-full  DIAGNOSTIC DATA REVIEWED:  BMET    Component Value Date/Time   NA 137 11/13/2021 1933   NA 140 01/26/2021 1427   K 3.9 11/13/2021 1933   CL 100 11/13/2021 1933   CO2  27 11/13/2021 1933   GLUCOSE 134 (H) 11/13/2021 1933   BUN 25 (H) 11/13/2021 1933   BUN 28 (H) 01/26/2021 1427   CREATININE 1.09 (H) 11/13/2021 1933   CALCIUM 10.1 11/13/2021 1933   GFRNONAA 54 (L) 11/13/2021 1933   GFRAA 54 (L) 05/12/2020 1557   Lab Results  Component Value Date   HGBA1C 5.5 08/30/2022   HGBA1C 6 1 02/09/2015   No results found for: "INSULIN" Lab Results  Component Value Date   TSH 1.71 05/30/2018   CBC    Component Value Date/Time   WBC 7.5 11/13/2021 1933   RBC 4.44 11/13/2021 1933   HGB 14.0 11/13/2021 1933   HGB 13.8 01/26/2021 1427   HCT 41.3 11/13/2021 1933   HCT 41.6 01/26/2021 1427   PLT 243 11/13/2021 1933   PLT 229 01/26/2021 1427   MCV 93.0 11/13/2021 1933   MCV 92 01/26/2021 1427   MCH 31.5 11/13/2021 1933   MCHC 33.9 11/13/2021 1933   RDW  12.1 11/13/2021 1933   RDW 13.2 01/26/2021 1427   Iron Studies No results found for: "IRON", "TIBC", "FERRITIN", "IRONPCTSAT" Lipid Panel     Component Value Date/Time   CHOL 134 12/16/2020 0957   TRIG 142 12/16/2020 0957   HDL 41 12/16/2020 0957   CHOLHDL 3.3 12/16/2020 0957   LDLCALC 68 12/16/2020 0957   Hepatic Function Panel     Component Value Date/Time   PROT 7.5 04/21/2020 1209   ALBUMIN 4.8 (H) 04/21/2020 1209   AST 25 04/21/2020 1209   ALT 25 04/21/2020 1209   ALKPHOS 104 04/21/2020 1209   BILITOT 0.5 04/21/2020 1209      Component Value Date/Time   TSH 1.71 05/30/2018 0000   Nutritional Lab Results  Component Value Date   VD25OH 64.2 06/06/2022   VD25OH 63.9 04/21/2020    Attestations:   Reviewed by clinician on day of visit: allergies, medications, problem list, medical history, surgical history, family history, social history, and previous encounter notes.   Patient was in the office today and time spent on visit including pre-visit chart review and post-visit care/coordination of care and electronic medical record documentation was 30 minutes. 50% of that time was in face to face counseling of this patient's medical condition(s) and providing education on treatment options to always include the first-line treatment of diet and lifestyle modification.    I,Special Puri,acting as a Neurosurgeon for [employer], DO.,have documented all relevant documentation on the behalf of [name], DO,as directed by  [name], DO while in the presence of [name], DO.   I, [name], DO, have reviewed all documentation for this visit. The documentation on 11/21/22 for the exam, diagnosis, procedures, and orders are all accurate and complete.

## 2022-11-30 ENCOUNTER — Ambulatory Visit (HOSPITAL_BASED_OUTPATIENT_CLINIC_OR_DEPARTMENT_OTHER): Payer: Medicare PPO | Admitting: Cardiology

## 2022-12-05 ENCOUNTER — Ambulatory Visit (INDEPENDENT_AMBULATORY_CARE_PROVIDER_SITE_OTHER): Payer: Medicare PPO | Admitting: Adult Health

## 2022-12-05 ENCOUNTER — Other Ambulatory Visit (HOSPITAL_BASED_OUTPATIENT_CLINIC_OR_DEPARTMENT_OTHER): Payer: Self-pay

## 2022-12-05 ENCOUNTER — Other Ambulatory Visit (HOSPITAL_COMMUNITY): Payer: Self-pay

## 2022-12-05 ENCOUNTER — Encounter (INDEPENDENT_AMBULATORY_CARE_PROVIDER_SITE_OTHER): Payer: Self-pay | Admitting: Adult Health

## 2022-12-05 VITALS — BP 111/70 | HR 54 | Temp 98.2°F | Ht 63.0 in | Wt 176.0 lb

## 2022-12-05 DIAGNOSIS — E1169 Type 2 diabetes mellitus with other specified complication: Secondary | ICD-10-CM | POA: Diagnosis not present

## 2022-12-05 DIAGNOSIS — Z7985 Long-term (current) use of injectable non-insulin antidiabetic drugs: Secondary | ICD-10-CM

## 2022-12-05 DIAGNOSIS — E119 Type 2 diabetes mellitus without complications: Secondary | ICD-10-CM

## 2022-12-05 DIAGNOSIS — E669 Obesity, unspecified: Secondary | ICD-10-CM

## 2022-12-05 DIAGNOSIS — Z6831 Body mass index (BMI) 31.0-31.9, adult: Secondary | ICD-10-CM | POA: Diagnosis not present

## 2022-12-05 DIAGNOSIS — Z6832 Body mass index (BMI) 32.0-32.9, adult: Secondary | ICD-10-CM

## 2022-12-05 DIAGNOSIS — I152 Hypertension secondary to endocrine disorders: Secondary | ICD-10-CM

## 2022-12-05 DIAGNOSIS — E1159 Type 2 diabetes mellitus with other circulatory complications: Secondary | ICD-10-CM | POA: Diagnosis not present

## 2022-12-05 DIAGNOSIS — Z794 Long term (current) use of insulin: Secondary | ICD-10-CM | POA: Diagnosis not present

## 2022-12-05 NOTE — Progress Notes (Signed)
WEIGHT SUMMARY AND BIOMETRICS  Vitals Temp: 98.2 F (36.8 C) BP: 111/70 Pulse Rate: (!) 54 SpO2: 97 %   Anthropometric Measurements Height: 5\' 3"  (1.6 m) Weight: 176 lb (79.8 kg) BMI (Calculated): 31.18 Weight at Last Visit: 181lb Weight Lost Since Last Visit: 5lb Weight Gained Since Last Visit: 0 Starting Weight: 227lb Total Weight Loss (lbs): 51 lb (23.1 kg)   Body Composition  Body Fat %: 45.2 % Fat Mass (lbs): 80 lbs Muscle Mass (lbs): 91.8 lbs Total Body Water (lbs): 74.6 lbs Visceral Fat Rating : 13   Other Clinical Data Fasting: no Labs: no Today's Visit #: 19 Starting Date: 03/17/20    Chief Complaint:   OBESITY Pamela Love is here to discuss her progress with her obesity treatment plan. She is on the the Category 2 Plan and states she is following her eating plan approximately 68 % of the time. She states she is exercising Cardio/Bike/Treadmill/Friday Night dancing- 60 minutes 1/1 times per week.   Interim History:  Ms. Hafler was unable to find Mounjaro 12.103m- so she bridged with Mounjaro 10mg - then finally found 12.5mg  strength and has had two doses. She has experienced GI upset- alternating between constipation and diarrhea. She denies hematochezia. She denies current GI upset at present.  Current weight 176 lbs with corresponding BMI 31 Weight of 168 lbs would achieve a BMI of 29  Subjective:   1. Hypertension associated with type 2 diabetes mellitus [facility]) Ms. Bartkus reports sx's of hypotension twice on the last several weeks. She experienced fatigue and dizziness. Her SBP was in high 90s. She is currently on  carvedilol (COREG) 3.125 MG tablet BID  furosemide (LASIX) 40 MG tablet THREE TIMES A WEEK   She reports historically limited water intake.  She is unsure if the hypotension days were the days of diuretic use.  2. Type 2 diabetes mellitus with obesity (HCC) Lab Results  Component Value Date   HGBA1C 5.5 08/30/2022   HGBA1C 6.0 (H)  06/06/2022   HGBA1C 6.1 (A) 02/25/2022   Home fasting levels: 4/27 156 4/29 155 4/30 131 5/1 159 5/5 104 5/6 137 She is currently on weekly on Mounjaro 12.5mg  and daily Toujeo 12 U   Assessment/Plan:   1. Hypertension associated with type 2 diabetes mellitus (HCC) Increase water intake and monitor for sx's of hypotension. If SBP <100 or sx's of hypotension continue- F/U with PCP/Dr. Clelia Croft  2. Type 2 diabetes mellitus with obesity (HCC) Continue current antidiabetic regime and close monitoring of BG  3. BMI 32.0-32.9,adult-CURRENT BMI 31.18  Ruthella is currently in the action stage of change. As such, her goal is to continue with weight loss efforts. She has agreed to the Category 2 Plan.   Exercise goals: Older adults should follow the adult guidelines. When older adults cannot meet the adult guidelines, they should be as physically active as their abilities and conditions will allow.  Older adults should do exercises that maintain or improve balance if they are at risk of falling.  Older adults should determine their level of effort for physical activity relative to their level of fitness.   Behavioral modification strategies: increasing lean protein intake, decreasing simple carbohydrates, increasing vegetables, increasing water intake, no skipping meals, meal planning and cooking strategies, and planning for success.  Akua has agreed to follow-up with our clinic in 2 weeks. She was informed of the importance of frequent follow-up visits to maximize her success with intensive lifestyle modifications for her multiple health  conditions.   Check Fasting Labs at next OV.  Objective:   Blood pressure 111/70, pulse (!) 54, temperature 98.2 F (36.8 C), height 5\' 3"  (1.6 m), weight 176 lb (79.8 kg), SpO2 97 %. Body mass index is 31.18 kg/m.  General: Cooperative, alert, well developed, in no acute distress. HEENT: Conjunctivae and lids unremarkable. Cardiovascular: Regular rhythm.   Lungs: Normal work of breathing. Neurologic: No focal deficits.   Lab Results  Component Value Date   CREATININE 1.09 (H) 11/13/2021   BUN 25 (H) 11/13/2021   NA 137 11/13/2021   K 3.9 11/13/2021   CL 100 11/13/2021   CO2 27 11/13/2021   Lab Results  Component Value Date   ALT 25 04/21/2020   AST 25 04/21/2020   ALKPHOS 104 04/21/2020   BILITOT 0.5 04/21/2020   Lab Results  Component Value Date   HGBA1C 5.5 08/30/2022   HGBA1C 6.0 (H) 06/06/2022   HGBA1C 6.1 (A) 02/25/2022   HGBA1C 5.8 (A) 10/14/2021   HGBA1C 6.1 (A) 07/12/2021   No results found for: "INSULIN" Lab Results  Component Value Date   TSH 1.71 05/30/2018   Lab Results  Component Value Date   CHOL 134 12/16/2020   HDL 41 12/16/2020   LDLCALC 68 12/16/2020   TRIG 142 12/16/2020   CHOLHDL 3.3 12/16/2020   Lab Results  Component Value Date   VD25OH 64.2 06/06/2022   VD25OH 63.9 04/21/2020   Lab Results  Component Value Date   WBC 7.5 11/13/2021   HGB 14.0 11/13/2021   HCT 41.3 11/13/2021   MCV 93.0 11/13/2021   PLT 243 11/13/2021   No results found for: "IRON", "TIBC", "FERRITIN"  Attestation Statements:   Reviewed by clinician on day of visit: allergies, medications, problem list, medical history, surgical history, family history, social history, and previous encounter notes.  Time spent on visit including pre-visit chart review and post-visit care and charting was 28 minutes.   I have reviewed the above documentation for accuracy and completeness, and I agree with the above. -  Tiegan Terpstra d. Azizah Lisle, NP-C

## 2022-12-06 ENCOUNTER — Other Ambulatory Visit (HOSPITAL_COMMUNITY): Payer: Self-pay

## 2022-12-06 DIAGNOSIS — B349 Viral infection, unspecified: Secondary | ICD-10-CM | POA: Diagnosis not present

## 2022-12-06 DIAGNOSIS — Z03818 Encounter for observation for suspected exposure to other biological agents ruled out: Secondary | ICD-10-CM | POA: Diagnosis not present

## 2022-12-06 DIAGNOSIS — J029 Acute pharyngitis, unspecified: Secondary | ICD-10-CM | POA: Diagnosis not present

## 2022-12-06 DIAGNOSIS — J0140 Acute pansinusitis, unspecified: Secondary | ICD-10-CM | POA: Diagnosis not present

## 2022-12-06 DIAGNOSIS — R059 Cough, unspecified: Secondary | ICD-10-CM | POA: Diagnosis not present

## 2022-12-08 ENCOUNTER — Other Ambulatory Visit (HOSPITAL_BASED_OUTPATIENT_CLINIC_OR_DEPARTMENT_OTHER): Payer: Self-pay | Admitting: Cardiology

## 2022-12-08 DIAGNOSIS — G4733 Obstructive sleep apnea (adult) (pediatric): Secondary | ICD-10-CM | POA: Diagnosis not present

## 2022-12-08 DIAGNOSIS — E782 Mixed hyperlipidemia: Secondary | ICD-10-CM

## 2022-12-08 DIAGNOSIS — I25118 Atherosclerotic heart disease of native coronary artery with other forms of angina pectoris: Secondary | ICD-10-CM

## 2022-12-08 DIAGNOSIS — I2510 Atherosclerotic heart disease of native coronary artery without angina pectoris: Secondary | ICD-10-CM

## 2022-12-19 ENCOUNTER — Other Ambulatory Visit (INDEPENDENT_AMBULATORY_CARE_PROVIDER_SITE_OTHER): Payer: Self-pay | Admitting: Family Medicine

## 2022-12-19 ENCOUNTER — Encounter (INDEPENDENT_AMBULATORY_CARE_PROVIDER_SITE_OTHER): Payer: Self-pay | Admitting: Family Medicine

## 2022-12-19 ENCOUNTER — Other Ambulatory Visit (HOSPITAL_BASED_OUTPATIENT_CLINIC_OR_DEPARTMENT_OTHER): Payer: Self-pay

## 2022-12-19 ENCOUNTER — Ambulatory Visit (INDEPENDENT_AMBULATORY_CARE_PROVIDER_SITE_OTHER): Payer: Medicare PPO | Admitting: Family Medicine

## 2022-12-19 VITALS — BP 110/67 | HR 83 | Temp 97.7°F | Ht 63.0 in | Wt 171.0 lb

## 2022-12-19 DIAGNOSIS — M8580 Other specified disorders of bone density and structure, unspecified site: Secondary | ICD-10-CM | POA: Diagnosis not present

## 2022-12-19 DIAGNOSIS — E1169 Type 2 diabetes mellitus with other specified complication: Secondary | ICD-10-CM | POA: Diagnosis not present

## 2022-12-19 DIAGNOSIS — I152 Hypertension secondary to endocrine disorders: Secondary | ICD-10-CM

## 2022-12-19 DIAGNOSIS — Z7985 Long-term (current) use of injectable non-insulin antidiabetic drugs: Secondary | ICD-10-CM | POA: Diagnosis not present

## 2022-12-19 DIAGNOSIS — Z6830 Body mass index (BMI) 30.0-30.9, adult: Secondary | ICD-10-CM

## 2022-12-19 DIAGNOSIS — E669 Obesity, unspecified: Secondary | ICD-10-CM | POA: Diagnosis not present

## 2022-12-19 DIAGNOSIS — Z6832 Body mass index (BMI) 32.0-32.9, adult: Secondary | ICD-10-CM

## 2022-12-19 DIAGNOSIS — E1159 Type 2 diabetes mellitus with other circulatory complications: Secondary | ICD-10-CM

## 2022-12-19 MED ORDER — TIRZEPATIDE 12.5 MG/0.5ML ~~LOC~~ SOAJ
12.5000 mg | SUBCUTANEOUS | 0 refills | Status: DC
  Filled 2022-12-19 – 2023-01-02 (×2): qty 2, 28d supply, fill #0

## 2022-12-19 NOTE — Progress Notes (Signed)
[name], D.O.  ABFM, ABOM Specializing in Clinical Bariatric Medicine  Office located at: [address] [place], [place]  [address]     Assessment and Plan:   Orders Placed This Encounter  Procedures   Lipid panel   VITAMIN D 25 Hydroxy (Vit-D Deficiency, Fractures)   Hemoglobin A1c   Comprehensive metabolic panel    Medications Discontinued During This Encounter  Medication Reason   tirzepatide Greggory Keen) 12.5 MG/0.5ML Pen Reorder     Meds ordered this encounter  Medications   tirzepatide (MOUNJARO) 12.5 MG/0.5ML Pen    Sig: Inject 12.5 mg into the skin once a week on Thursday.    Dispense:  2 mL    Refill:  0     Type 2 diabetes mellitus with obesity (HCC) Assessment: Condition is improving, but not optimized. Lab Results  Component Value Date   HGBA1C 5.5 08/30/2022   HGBA1C 6.0 (H) 06/06/2022   HGBA1C 6.1 (A) 02/25/2022  - She has been compliant with Mounjaro 12.5 mg weekly and Toujaeo 10 units.  - Her hunger and cravings are mostly controlled.  - Her following sugars at home before breakfast: 127 on 5/15, 117 on 5/18, and 147 on 5/19.   Plan:  - Recheck labs.  - Continue with all antidiabetic medications as prescribed.  - Reminded Pamela Love if she feels poorly- check Blood Sugar and Blood Pressure at that time.    - Continue her prudent nutritional plan that is low in simple carbohydrates, saturated fats and trans fats to goal of 5-10% weight loss to achieve significant health benefits.     Hypertension associated with type 2 diabetes mellitus (HCC) Assessment: Condition is stable. Last 3 blood pressure readings in our office are as follows: BP Readings from Last 3 Encounters:  12/19/22 110/67  12/05/22 111/70  11/21/22 117/74  - Today she is asymptomatic, no concerns. - She has been compliant with Coreg 3.125 mg BID and Lasix 40 mg three times a week.  Plan: - Recheck labs.  - Continue will all meds as recommended by  specialist.  - Counseled Pamela Love on pathophysiology of disease and discussed treatment  - Ambulatory blood pressure monitoring encouraged.  Reminded patient that if they ever feel poorly in any way, to check their blood pressure and pulse as well. - We will continue to monitor closely alongside PCP/ specialists.  Pt reminded to also f/up with those individuals as instructed by them.  - We will continue to monitor symptoms as they relate to the her weight loss journey.    Vit D def with Osteopenia Assessment: Condition is stable. Lab Results  Component Value Date   VD25OH 64.2 06/06/2022   VD25OH 63.9 04/21/2020  - Patient endorses taking OTC Vitamin D 2,000 IU daily. Denies any side effects.  Plan: - Recheck labs. - Continue with OTC Vitamin D.  - Continue her prudent nutritional plan that is low in simple carbohydrates, saturated fats and trans fats to goal of 5-10% weight loss to achieve significant health benefits.  Pt encouraged to continually advance exercise and cardiovascular fitness as tolerated throughout weight loss journey.    TREATMENT PLAN FOR OBESITY: BMI 32.0-32.9,adult-CURRENT BMI 30.3 Morbid obesity (HCC)-START BMI 40.21/DATE 03/17/20 Assessment:  Pamela Love is here to discuss her progress with her obesity treatment plan along with follow-up of her obesity related diagnoses. See Medical Weight Management Flowsheet for complete bioelectrical impedance results.  Condition is improving, but not optimized.  Biometric data collected today, was reviewed with patient.   Since last office visit on 12/05/22 patient's Fat mass has decreased by 4.2 lb. Muscle mass has decreased by 0.8 lb. Total body water has decreased by 2.6 lb.  Counseling done on how various foods will affect these numbers and how to maximize success  Total lbs lost to date: 56 Total weight loss percentage to date: 24.67   Plan:  Greylyn is currently in the action stage of change. As  such, her goal is to continue her weight management plan. Reyhan will work on healthier eating habits and continue the Category 2 meal plan.   Behavioral Intervention Additional resources provided today: patient declined Evidence-based interventions for health behavior change were utilized today including the discussion of self monitoring techniques, problem-solving barriers and SMART goal setting techniques.   Regarding patient's less desirable eating habits and patterns, we employed the technique of small changes.  Pt will specifically work on: continue adherence to prudent nutritional plan for next visit.    Recommended Physical Activity Goals  [name] has been advised to slowly work up to 150 minutes of moderate intensity aerobic activity a week and strengthening exercises 2-3 times per week for cardiovascular health, weight loss maintenance and preservation of muscle mass.   She has agreed to Continue current level of physical activity   FOLLOW UP: Return in about 2 weeks (around 01/02/2023). She was informed of the importance of frequent follow up visits to maximize her success with intensive lifestyle modifications for her multiple health conditions.  Pamela Love is aware that we will review all of her lab results at our next visit.  She is aware that if anything is critical/ life threatening with the results, we will be contacting her via MyChart prior to the office visit to discuss management.   Subjective:   Chief complaint: Obesity [name] is here to discuss her progress with her obesity treatment plan. She is on the the Category 2 Plan and states she is following her eating plan approximately 70% of the time. She states she is dancing 60 minutes 1 days per week.  Interval History:  Pamela Love is here for a follow up office visit.     Since last office visit:   - Was not able to follow the meal plan closely for a few days because she had a sinus and viral  infection.  - She has recovered from both illnesses and is back on track.  - When eating on plan, her hunger and cravings are well controlled  - She will be going to [place] for a one week vacation.   Pharmacotherapy for weight loss: She is currently taking  Mounjaro  for medical weight loss.  Denies side effects.    Review of Systems:  Pertinent positives were addressed with patient today.  Weight Summary and Biometrics   Weight Lost Since Last Visit: 5 lb  Weight Gained Since Last Visit: 0    Vitals Temp: 97.7 F (36.5 C) BP: 110/67 Pulse Rate: 83 SpO2: 99 %   Anthropometric Measurements Height: 5\' 3"  (1.6 m) Weight: 171 lb (77.6 kg) BMI (Calculated): 30.3 Weight at Last Visit: 176 lb Weight Lost Since Last Visit: 5 lb Weight Gained Since Last Visit: 0 Starting Weight: 227 lb Total Weight Loss (lbs): 56 lb (25.4 kg) Peak Weight: 250 lb   Body Composition  Body Fat %: 44.2 % Fat Mass (lbs): 75.8 lbs Muscle Mass (lbs): 91 lbs Total Body Water (  lbs): 72 lbs Visceral Fat Rating : 13   Other Clinical Data Fasting: Yes Labs: Yes Today's Visit #: 30 Starting Date: 03/17/20    Objective:   PHYSICAL EXAM: Blood pressure 110/67, pulse 83, temperature 97.7 F (36.5 C), height 5\' 3"  (1.6 m), weight 171 lb (77.6 kg), SpO2 99 %. Body mass index is 30.29 kg/m.  General: Well Developed, well nourished, and in no acute distress.  HEENT: Normocephalic, atraumatic Skin: Warm and dry, cap RF less 2 sec, good turgor Chest:  Normal excursion, shape, no gross abn Respiratory: speaking in full sentences, no conversational dyspnea NeuroM-Sk: Ambulates w/o assistance, moves * 4 Psych: A and O *3, insight good, mood-full  DIAGNOSTIC DATA REVIEWED:  BMET    Component Value Date/Time   NA 137 11/13/2021 1933   NA 140 01/26/2021 1427   K 3.9 11/13/2021 1933   CL 100 11/13/2021 1933   CO2 27 11/13/2021 1933   GLUCOSE 134 (H) 11/13/2021 1933   BUN 25 (H) 11/13/2021  1933   BUN 28 (H) 01/26/2021 1427   CREATININE 1.09 (H) 11/13/2021 1933   CALCIUM 10.1 11/13/2021 1933   GFRNONAA 54 (L) 11/13/2021 1933   GFRAA 54 (L) 05/12/2020 1557   Lab Results  Component Value Date   HGBA1C 5.5 08/30/2022   HGBA1C 6 1 02/09/2015   No results found for: "INSULIN" Lab Results  Component Value Date   TSH 1.71 05/30/2018   CBC    Component Value Date/Time   WBC 7.5 11/13/2021 1933   RBC 4.44 11/13/2021 1933   HGB 14.0 11/13/2021 1933   HGB 13.8 01/26/2021 1427   HCT 41.3 11/13/2021 1933   HCT 41.6 01/26/2021 1427   PLT 243 11/13/2021 1933   PLT 229 01/26/2021 1427   MCV 93.0 11/13/2021 1933   MCV 92 01/26/2021 1427   MCH 31.5 11/13/2021 1933   MCHC 33.9 11/13/2021 1933   RDW 12.1 11/13/2021 1933   RDW 13.2 01/26/2021 1427   Iron Studies No results found for: "IRON", "TIBC", "FERRITIN", "IRONPCTSAT" Lipid Panel     Component Value Date/Time   CHOL 134 12/16/2020 0957   TRIG 142 12/16/2020 0957   HDL 41 12/16/2020 0957   CHOLHDL 3.3 12/16/2020 0957   LDLCALC 68 12/16/2020 0957   Hepatic Function Panel     Component Value Date/Time   PROT 7.5 04/21/2020 1209   ALBUMIN 4.8 (H) 04/21/2020 1209   AST 25 04/21/2020 1209   ALT 25 04/21/2020 1209   ALKPHOS 104 04/21/2020 1209   BILITOT 0.5 04/21/2020 1209      Component Value Date/Time   TSH 1.71 05/30/2018 0000   Nutritional Lab Results  Component Value Date   VD25OH 64.2 06/06/2022   VD25OH 63.9 04/21/2020    Attestations:   Reviewed by clinician on day of visit: allergies, medications, problem list, medical history, surgical history, family history, social history, and previous encounter notes.    I,Special Puri,acting as a Neurosurgeon for [employer], DO.,have documented all relevant documentation on the behalf of [name], DO,as directed by  [name], DO while in the presence of [name], DO.   I, [name], DO, have reviewed all documentation for this  visit. The documentation on 12/19/22 for the exam, diagnosis, procedures, and orders are all accurate and complete.

## 2022-12-20 LAB — COMPREHENSIVE METABOLIC PANEL
ALT: 25 IU/L (ref 0–32)
AST: 22 IU/L (ref 0–40)
Albumin/Globulin Ratio: 1.9 (ref 1.2–2.2)
Albumin: 4.4 g/dL (ref 3.8–4.8)
Alkaline Phosphatase: 108 IU/L (ref 44–121)
BUN/Creatinine Ratio: 32 — ABNORMAL HIGH (ref 12–28)
BUN: 35 mg/dL — ABNORMAL HIGH (ref 8–27)
Bilirubin Total: 0.5 mg/dL (ref 0.0–1.2)
CO2: 21 mmol/L (ref 20–29)
Calcium: 10.2 mg/dL (ref 8.7–10.3)
Chloride: 105 mmol/L (ref 96–106)
Creatinine, Ser: 1.11 mg/dL — ABNORMAL HIGH (ref 0.57–1.00)
Globulin, Total: 2.3 g/dL (ref 1.5–4.5)
Glucose: 83 mg/dL (ref 70–99)
Potassium: 4.7 mmol/L (ref 3.5–5.2)
Sodium: 141 mmol/L (ref 134–144)
Total Protein: 6.7 g/dL (ref 6.0–8.5)
eGFR: 52 mL/min/{1.73_m2} — ABNORMAL LOW (ref >59)

## 2022-12-20 LAB — LIPID PANEL
Chol/HDL Ratio: 2.9 ratio (ref 0.0–4.4)
Cholesterol, Total: 133 mg/dL (ref 100–199)
HDL: 46 mg/dL (ref >39)
LDL Chol Calc (NIH): 67 mg/dL (ref 0–99)
Triglycerides: 110 mg/dL (ref 0–149)
VLDL Cholesterol Cal: 20 mg/dL (ref 5–40)

## 2022-12-20 LAB — HEMOGLOBIN A1C
Est. average glucose Bld gHb Est-mCnc: 117 mg/dL
Hgb A1c MFr Bld: 5.7 % — ABNORMAL HIGH (ref 4.8–5.6)

## 2022-12-20 LAB — VITAMIN D 25 HYDROXY (VIT D DEFICIENCY, FRACTURES): Vit D, 25-Hydroxy: 77.1 ng/mL (ref 30.0–100.0)

## 2022-12-22 ENCOUNTER — Other Ambulatory Visit: Payer: Self-pay | Admitting: Cardiology

## 2022-12-22 DIAGNOSIS — I2510 Atherosclerotic heart disease of native coronary artery without angina pectoris: Secondary | ICD-10-CM

## 2022-12-27 ENCOUNTER — Ambulatory Visit (HOSPITAL_BASED_OUTPATIENT_CLINIC_OR_DEPARTMENT_OTHER): Payer: Medicare PPO | Admitting: Cardiology

## 2023-01-02 ENCOUNTER — Other Ambulatory Visit (HOSPITAL_BASED_OUTPATIENT_CLINIC_OR_DEPARTMENT_OTHER): Payer: Self-pay

## 2023-01-02 ENCOUNTER — Other Ambulatory Visit: Payer: Self-pay

## 2023-01-02 DIAGNOSIS — G4733 Obstructive sleep apnea (adult) (pediatric): Secondary | ICD-10-CM | POA: Diagnosis not present

## 2023-01-06 ENCOUNTER — Ambulatory Visit (HOSPITAL_BASED_OUTPATIENT_CLINIC_OR_DEPARTMENT_OTHER): Payer: Medicare PPO | Admitting: Cardiology

## 2023-01-06 ENCOUNTER — Encounter (HOSPITAL_BASED_OUTPATIENT_CLINIC_OR_DEPARTMENT_OTHER): Payer: Self-pay | Admitting: Cardiology

## 2023-01-06 VITALS — BP 114/72 | HR 74 | Ht 63.0 in | Wt 178.4 lb

## 2023-01-06 DIAGNOSIS — I10 Essential (primary) hypertension: Secondary | ICD-10-CM | POA: Diagnosis not present

## 2023-01-06 DIAGNOSIS — I2510 Atherosclerotic heart disease of native coronary artery without angina pectoris: Secondary | ICD-10-CM

## 2023-01-06 DIAGNOSIS — Z955 Presence of coronary angioplasty implant and graft: Secondary | ICD-10-CM

## 2023-01-06 DIAGNOSIS — G4733 Obstructive sleep apnea (adult) (pediatric): Secondary | ICD-10-CM | POA: Diagnosis not present

## 2023-01-06 DIAGNOSIS — E1169 Type 2 diabetes mellitus with other specified complication: Secondary | ICD-10-CM | POA: Diagnosis not present

## 2023-01-06 DIAGNOSIS — Z6832 Body mass index (BMI) 32.0-32.9, adult: Secondary | ICD-10-CM

## 2023-01-06 DIAGNOSIS — E782 Mixed hyperlipidemia: Secondary | ICD-10-CM

## 2023-01-06 DIAGNOSIS — Z794 Long term (current) use of insulin: Secondary | ICD-10-CM

## 2023-01-06 DIAGNOSIS — E6609 Other obesity due to excess calories: Secondary | ICD-10-CM | POA: Diagnosis not present

## 2023-01-06 NOTE — Patient Instructions (Signed)
Medication Instructions:  Your physician recommends that you continue on your current medications as directed. Please refer to the Current Medication list given to you today.  *If you need a refill on your cardiac medications before your next appointment, please call your pharmacy*  Follow-Up: At [facility], you and your health needs are our priority.  As part of our continuing mission to provide you with exceptional heart care, we have created designated Provider Care Teams.  These Care Teams include your primary Cardiologist (physician) and Advanced Practice Providers (APPs -  Physician Assistants and Nurse Practitioners) who all work together to provide you with the care you need, when you need it.  We recommend signing up for the patient portal called "MyChart".  Sign up information is provided on this After Visit Summary.  MyChart is used to connect with patients for Virtual Visits (Telemedicine).  Patients are able to view lab/test results, encounter notes, upcoming appointments, etc.  Non-urgent messages can be sent to your provider as well.   To learn more about what you can do with MyChart, go to [email or web address].    Your next appointment:   6 month(s)  Provider:   Bridgette Christopher, MD    

## 2023-01-06 NOTE — Progress Notes (Signed)
Cardiology Office Note:    Date:  01/06/2023   ID:  Pamela Love, DOB 12-29-1950, MRN [id number]  PCP:  Lupita Raider, MD  Cardiologist:  Jodelle Red, MD  Referring MD: Lupita Raider, MD   CC: Follow-up  History of Present Illness:    Pamela Love is a 72 y.o. female with a hx of arthritis, CAD, CKD stage IIIa, diabetes type 2,  hyperlipidemia, hypertension, obesity, and OSA on CPAP who is seen for follow-up. She was previously a patient of Dr. Servando Salina.     Cardiovascular risk factors: Prior clinical ASCVD: CAD. From May 11-June 27th 2022 she had 5 "anginal attacks". Since her stent placement in 01/2021 her symptoms are much reduced. Comorbid conditions: CKD stage IIIa, diabetes type 2, hyperlipidemia, hypertension Metabolic syndrome/Obesity: BMI 35, working on weight loss with [employer] [facility] [employer] [facility] Exercise level: Exercises at cardiac rehab facility (past year), usually for 1 hour and then becomes fatigued. Still becomes fatigued and short of breath climbing a hill. She is participating in [facility].  Current diet: Special occasion glass of wine or frozen margarita.  Today: Went to the beach last week, noted that her ankles were more swollen. Didn't watch what she was eating or drinking while she was there. Now resolved. Losing weight, now sees Dr. Lafe Garin for her diabetes. Working with Dr. Clelia Croft on her GI issues. No GI/chest pain since adding pepcid at night.   Goes to the gym 1-2 times/week and dances on Friday nights. Can be at the gym for up to 3 hours at a time.  Denies chest pain, shortness of breath at rest or with normal exertion. No PND, orthopnea, LE edema or unexpected weight gain. No syncope or palpitations.   Past Medical History:  Diagnosis Date   Abnormal nuclear stress test 01/02/2020   Arthritis    Bilateral leg edema 01/02/2020   Bursitis    hips   C. difficile colitis 12/2017   CAD (coronary artery disease) 01/06/2020    CKD (chronic kidney disease), stage III (HCC)    Complication of anesthesia    "spinal did not work with second c section"   Diabetes mellitus (HCC) 11/19/2015   Diabetes mellitus, type 2 (HCC)    diet controlled - has Rx for Glymipride but has not started taking yet   Dyslipidemia 10/21/2014   GERD (gastroesophageal reflux disease)    History of nonmelanoma skin cancer    History of skin cancer    Hypercholesterolemia 12/11/2019   Hyperlipidemia    Hypertension    Hypertensive disorder 10/21/2014   Lower extremity edema    Mixed hyperlipidemia 01/02/2020   OAB (overactive bladder)    Obesity (BMI 30-39.9) 05/12/2020   OSA (obstructive sleep apnea) 05/12/2020   Osteoarthritis of right knee 05/15/2015   Osteoarthrosis, unspecified whether generalized or localized, involving lower leg 10/21/2014   Osteoporosis    Over weight    Primary osteoarthritis of left knee 12/18/2014   S/P knee replacement 12/18/2014   S/P total knee replacement using cement 05/15/2015   Sleep apnea    Tachycardia 12/11/2019    Past Surgical History:  Procedure Laterality Date   ABDOMINAL HYSTERECTOMY  2005   CATARACT EXTRACTION     CESAREAN SECTION     x2   colonscopy      CORONARY PRESSURE/FFR STUDY N/A 01/06/2020   Procedure: INTRAVASCULAR PRESSURE WIRE/FFR STUDY;  Surgeon: Runell Gess, MD;  Location: [facility];  Service: Cardiovascular;  Laterality:  N/A;   CORONARY STENT INTERVENTION N/A 01/06/2020   Procedure: CORONARY STENT INTERVENTION;  Surgeon: Runell Gess, MD;  Location: [facility];  Service: Cardiovascular;  Laterality: N/A;   EYE SURGERY     bilat cataract surgery 2015   LEFT HEART CATH AND CORONARY ANGIOGRAPHY N/A 01/06/2020   Procedure: LEFT HEART CATH AND CORONARY ANGIOGRAPHY;  Surgeon: Runell Gess, MD;  Location: [facility];  Service: Cardiovascular;  Laterality: N/A;   LEFT HEART CATH AND CORONARY ANGIOGRAPHY N/A 01/29/2021   Procedure: LEFT HEART CATH  AND CORONARY ANGIOGRAPHY;  Surgeon: Corky Crafts, MD;  Location: [facility] CV [facility];  Service: Cardiovascular;  Laterality: N/A;   TOTAL KNEE ARTHROPLASTY Left 12/18/2014   Procedure: TOTAL LEFT KNEE ARTHROPLASTY;  Surgeon: Eugenia Mcalpine, MD;  Location: [facility];  Service: Orthopedics;  Laterality: Left;   TOTAL KNEE ARTHROPLASTY Right 05/15/2015   Procedure: RIGHT TOTAL KNEE ARTHROPLASTY;  Surgeon: Eugenia Mcalpine, MD;  Location: [facility];  Service: Orthopedics;  Laterality: Right;   UTERINE FIBROID SURGERY      Current Medications: Current Outpatient Medications on File Prior to Visit  Medication Sig   Accu-Chek Softclix Lancets lancets USE TO CHECK BLOOD SUGAR TWICE DAILY   acetaminophen (TYLENOL) 650 MG CR tablet Take 1,300 mg by mouth at bedtime.   alum & mag hydroxide-simeth (MAALOX MAX) 400-400-40 MG/5ML suspension Take 15 mLs by mouth every 6 (six) hours as needed for indigestion.   aspirin EC 81 MG tablet Take 1 tablet (81 mg total) by mouth in the morning.   Blood Glucose Monitoring Suppl (ACCU-CHEK GUIDE ME) w/Device KIT 1 each by Does not apply route 2 (two) times daily. E11.9   carvedilol (COREG) 3.125 MG tablet Take 1 tablet (3.125 mg total) by mouth 2 (two) times daily with a meal.   Cholecalciferol (VITAMIN D) 2000 UNITS CAPS Take 2,000 Units by mouth every morning.    clopidogrel (PLAVIX) 75 MG tablet TAKE 1 TABLET BY MOUTH DAILY WITH BREAKFAST.   Coenzyme Q10 (COQ10) 200 MG CAPS Take 200 mg by mouth in the morning.   DULoxetine (CYMBALTA) 20 MG capsule Take 40 mg by mouth at bedtime.    furosemide (LASIX) 40 MG tablet TAKE 1 TABLET (40 MG TOTAL) BY MOUTH 3 (THREE) TIMES A WEEK. TUESDAYS, THURSDAYS & SATURDAYS IN THE MORNING   glucose blood (ACCU-CHEK GUIDE) test strip Use to check BS 2x a day   insulin glargine, 2 Unit Dial, (TOUJEO MAX SOLOSTAR) 300 UNIT/ML Solostar Pen Inject 18 Units into the skin daily. (Patient taking differently: Inject 12 Units into the skin daily.)    Insulin Pen Needle (BD PEN NEEDLE NANO U/F) 32G X 4 MM MISC 1 each by Does not apply route daily.   KLOR-CON M20 20 MEQ tablet TAKE 1 TABLET (20 MEQ TOTAL) BY MOUTH 3 (THREE) TIMES A WEEK. TUESDAYS, THURSDAYS & SATURDAYS IN THE MORNING   lidocaine (XYLOCAINE) 2 % solution Use as directed 15 mLs in the mouth or throat as needed for mouth pain.   Multiple Vitamin (MULTIVITAMIN WITH MINERALS) TABS tablet Take 1 tablet by mouth every morning.    Omega-3 Fatty Acids (RA FISH OIL) 1400 MG CPDR Take 1,400 mg by mouth 2 (two) times daily.   pantoprazole (PROTONIX) 40 MG tablet Take 40 mg by mouth daily.   ranolazine (RANEXA) 1000 MG SR tablet TAKE 1 TABLET BY MOUTH TWICE A DAY   rosuvastatin (CRESTOR) 40 MG tablet TAKE 1 TABLET (40  MG TOTAL) BY MOUTH IN THE MORNING   saccharomyces boulardii (FLORASTOR) 250 MG capsule Take 250 mg by mouth in the morning.   tirzepatide (MOUNJARO) 10 MG/0.5ML Pen Inject 10 mg into the skin once a week.   tirzepatide [facility]) 12.5 MG/0.5ML Pen Inject 12.5 mg into the skin once a week on Thursday.   trospium (SANCTURA) 20 MG tablet Take 20 mg by mouth 2 (two) times daily.   vitamin C (ASCORBIC ACID) 250 MG tablet Take 250 mg by mouth in the morning.   nitroGLYCERIN (NITROSTAT) 0.4 MG SL tablet PLACE 1 TABLET (0.4 MG TOTAL) UNDER THE TONGUE EVERY 5 (FIVE) MINUTES X 3 DOSES AS NEEDED FOR CHEST PAIN.   No current facility-administered medications on file prior to visit.     Allergies:   Amoxicillin-pot clavulanate, Aspirin, Colchicine, Diclofenac, Dulaglutide, Empagliflozin, Ezetimibe, Imdur [isosorbide nitrate], Isosorbide, Metformin, Repaglinide, Sitagliptin-metformin hcl, Sulfamethoxazole-trimethoprim, Imipramine, and Tape   Social History   Tobacco Use   Smoking status: Never   Smokeless tobacco: Never  Vaping Use   Vaping Use: Never used  Substance Use Topics   Alcohol use: Yes    Comment: rare   Drug use: No    Family History: family history includes  Cancer in her mother; Congenital heart disease in her son; Diabetes in her maternal grandfather; Heart attack in her father, maternal grandfather, paternal uncle, and sister; Heart disease in her father and mother; Hyperlipidemia in her father and mother; Hypertension in her father and mother; Sleep apnea in her father; Sudden death in her father.  ROS:   Please see the history of present illness. All other systems are reviewed and negative.    EKGs/Labs/Other Studies Reviewed:    The following studies were reviewed today:  LHC 01/29/2021: Prox LAD to Mid LAD lesion is 40-50% stenosed. Angiographic appeareance improved compared to prior cath in 2021 when DFR was negative. Patent RCA stents. The left ventricular systolic function is normal. LV end diastolic pressure is normal. The left ventricular ejection fraction is 55-65% by visual estimate. There is no aortic valve stenosis.   Continue medical therapy.  Echo 01/07/2021:  1. Left ventricular ejection fraction, by estimation, is 60 to 65%. The  left ventricle has normal function. The left ventricle has no regional  wall motion abnormalities. Left ventricular diastolic parameters are  consistent with Grade I diastolic  dysfunction (impaired relaxation).   2. Right ventricular systolic function is normal. The right ventricular  size is normal. There is normal pulmonary artery systolic pressure.   3. The mitral valve is normal in structure. Mild mitral valve  regurgitation. No evidence of mitral stenosis.   4. The aortic valve is normal in structure. There is mild calcification  of the aortic valve. There is mild thickening of the aortic valve. Aortic  valve regurgitation is not visualized. No aortic stenosis is present.   5. The inferior vena cava is normal in size with greater than 50%  respiratory variability, suggesting right atrial pressure of 3 mmHg.   Lexiscan Myoview 01/01/2020: The left ventricular ejection fraction is normal  (55-65%). Nuclear stress EF: 62%. There was no ST segment deviation noted during stress. Defect 1: There is a small defect of mild severity present in the basal anterior and mid anterior location. Findings consistent with ischemia. This is an intermediate risk study. Moderate area of mild ischemia involving LAD teritory. Normal LVEF.  EKG:  EKG is personally reviewed.  01/06/23: NSR, PRWP at 74 bpm 06/15/2022:  NSR, PRWP  at 75 bpm 11/13/2021 (ED):  Normal sinus rhythm at 78 bpm. Left anterior fascicular block.  09/10/2021: NSR, PRWP, 81 bpm 03/10/2021: not ordered  Recent Labs: 12/19/2022: ALT 25; BUN 35; Creatinine, Ser 1.11; Potassium 4.7; Sodium 141   Recent Lipid Panel    Component Value Date/Time   CHOL 133 12/19/2022 1435   TRIG 110 12/19/2022 1435   HDL 46 12/19/2022 1435   CHOLHDL 2.9 12/19/2022 1435   LDLCALC 67 12/19/2022 1435    Physical Exam:    VS:  BP 114/72   Pulse 74   Ht 5\' 3"  (1.6 m)   Wt 178 lb 6.4 oz (80.9 kg)   SpO2 99%   BMI 31.60 kg/m     Wt Readings from Last 3 Encounters:  01/06/23 178 lb 6.4 oz (80.9 kg)  12/19/22 171 lb (77.6 kg)  12/05/22 176 lb (79.8 kg)    GEN: Well nourished, well developed in no acute distress HEENT: Normal, moist mucous membranes NECK: No JVD CARDIAC: regular rhythm, normal S1 and S2, no rubs or gallops. 1/6 systolic murmur. VASCULAR: Radial and DP pulses 2+ bilaterally. No carotid bruits RESPIRATORY:  Clear to auscultation without rales, wheezing or rhonchi  ABDOMEN: Soft, non-tender, non-distended MUSCULOSKELETAL:  Ambulates independently SKIN: Warm and dry, no edema NEUROLOGIC:  Alert and oriented x 3. No focal neuro deficits noted. PSYCHIATRIC:  Normal affect     ASSESSMENT:    1. Coronary artery disease involving native coronary artery of native heart without angina pectoris   2. History of coronary angioplasty with insertion of stent   3. Type 2 diabetes mellitus with other specified complication, with  long-term current use of insulin (HCC)   4. Mixed hyperlipidemia   5. Class 1 obesity due to excess calories with serious comorbidity and body mass index (BMI) of 32.0 to 32.9 in adult   6. Essential hypertension   7. OSA (obstructive sleep apnea)    PLAN:    CAD, with prior PCI Mixed hyperlipidemia Type II diabetes Obesity, BMI 31 -Aspirin 81 mg -statin: continue rosuvastatin 40 mg daily -LDL goal <70, LDL 67 -antianginals: beta blocker (carvedilol), ranolazine. Not on isosorbide as it made her hypotensive -Given type II diabetes and CAD, she is on GLP1RA. Tolerates mounjaro better than ozempic -has prescription for sublingual nitroglycerin, counseled on lifespan of this medication -counseled on diet recommendations and AHA exercise guidelines, below -risk factor modification, as below -counseled on red flag warning signs that need immediate medical attention  -no chest pain since GI meds adjusted  Hypertension:  -at goal of <130/80, continue current meds. Working on weight loss  OSA: -followed by Dr. Tresa Endo, benefits from CPAP therapy for her OSA.  Cardiac risk counseling and prevention recommendations: -recommend heart healthy/Mediterranean diet, with whole grains, fruits, vegetable, fish, lean meats, nuts, and olive oil. Limit salt. -recommend moderate walking, 3-5 times/week for 30-50 minutes each session. Aim for at least 150 minutes.week. Goal should be pace of 3 miles/hours, or walking 1.5 miles in 30 minutes -recommend avoidance of tobacco products. Avoid excess alcohol.  Plan for follow up: 6 months or sooner as needed.  Jodelle Red, MD, PhD, [facility] [place]  [facility]    Medication Adjustments/Labs and Tests Ordered: Current medicines are reviewed at length with the patient today.  Concerns regarding medicines are outlined above.   Orders Placed This Encounter  Procedures   EKG 12-Lead   No orders of the defined types were placed in this  encounter.  Patient  Instructions  Medication Instructions:  Your physician recommends that you continue on your current medications as directed. Please refer to the Current Medication list given to you today.  *If you need a refill on your cardiac medications before your next appointment, please call your pharmacy*  Follow-Up: At [facility], you and your health needs are our priority.  As part of our continuing mission to provide you with exceptional heart care, we have created designated Provider Care Teams.  These Care Teams include your primary Cardiologist (physician) and Advanced Practice Providers (APPs -  Physician Assistants and Nurse Practitioners) who all work together to provide you with the care you need, when you need it.  We recommend signing up for the patient portal called "MyChart".  Sign up information is provided on this After Visit Summary.  MyChart is used to connect with patients for Virtual Visits (Telemedicine).  Patients are able to view lab/test results, encounter notes, upcoming appointments, etc.  Non-urgent messages can be sent to your provider as well.   To learn more about what you can do with MyChart, go to [email or web address].    Your next appointment:   6 month(s)  Provider:   Jodelle Red, MD       Signed, Jodelle Red, MD PhD 01/06/2023     [facility]

## 2023-01-09 ENCOUNTER — Ambulatory Visit (INDEPENDENT_AMBULATORY_CARE_PROVIDER_SITE_OTHER): Payer: Medicare PPO | Admitting: Adult Health

## 2023-01-09 ENCOUNTER — Encounter (INDEPENDENT_AMBULATORY_CARE_PROVIDER_SITE_OTHER): Payer: Self-pay | Admitting: Adult Health

## 2023-01-09 VITALS — BP 105/65 | HR 74 | Temp 98.2°F | Ht 63.0 in | Wt 172.0 lb

## 2023-01-09 DIAGNOSIS — Z794 Long term (current) use of insulin: Secondary | ICD-10-CM

## 2023-01-09 DIAGNOSIS — E785 Hyperlipidemia, unspecified: Secondary | ICD-10-CM | POA: Diagnosis not present

## 2023-01-09 DIAGNOSIS — Z6830 Body mass index (BMI) 30.0-30.9, adult: Secondary | ICD-10-CM

## 2023-01-09 DIAGNOSIS — M8580 Other specified disorders of bone density and structure, unspecified site: Secondary | ICD-10-CM | POA: Diagnosis not present

## 2023-01-09 DIAGNOSIS — E669 Obesity, unspecified: Secondary | ICD-10-CM

## 2023-01-09 DIAGNOSIS — E1159 Type 2 diabetes mellitus with other circulatory complications: Secondary | ICD-10-CM

## 2023-01-09 DIAGNOSIS — Z7985 Long-term (current) use of injectable non-insulin antidiabetic drugs: Secondary | ICD-10-CM | POA: Diagnosis not present

## 2023-01-09 DIAGNOSIS — E1169 Type 2 diabetes mellitus with other specified complication: Secondary | ICD-10-CM

## 2023-01-09 DIAGNOSIS — I152 Hypertension secondary to endocrine disorders: Secondary | ICD-10-CM | POA: Diagnosis not present

## 2023-01-09 DIAGNOSIS — E559 Vitamin D deficiency, unspecified: Secondary | ICD-10-CM | POA: Diagnosis not present

## 2023-01-09 DIAGNOSIS — Z6832 Body mass index (BMI) 32.0-32.9, adult: Secondary | ICD-10-CM

## 2023-01-09 MED ORDER — TIRZEPATIDE 12.5 MG/0.5ML ~~LOC~~ SOAJ
12.5000 mg | SUBCUTANEOUS | 0 refills | Status: DC
Start: 2023-01-09 — End: 2023-02-14
  Filled 2023-01-30: qty 2, 28d supply, fill #0

## 2023-01-09 NOTE — Progress Notes (Signed)
WEIGHT SUMMARY AND BIOMETRICS  Vitals Temp: 98.2 F (36.8 C) BP: 105/65 Pulse Rate: 74 SpO2: 100 %   Anthropometric Measurements Height: 5\' 3"  (1.6 m) Weight: 172 lb (78 kg) BMI (Calculated): 30.48 Weight at Last Visit: 171 lb Weight Lost Since Last Visit: 0 Weight Gained Since Last Visit: 1 lb Starting Weight: 227 lb Total Weight Loss (lbs): 55 lb (24.9 kg) Peak Weight: 250 lb   Body Composition  Body Fat %: 44.5 % Fat Mass (lbs): 76.8 lbs Muscle Mass (lbs): 91.2 lbs Total Body Water (lbs): 75 lbs Visceral Fat Rating : 13   Other Clinical Data Fasting: no Labs: no Today's Visit #: 60 Starting Date: 03/17/20    Chief Complaint:   OBESITY Pamela Love is here to discuss her progress with her obesity treatment plan. She is on the the Category 2 Plan and states she is following her eating plan approximately 60 % of the time. She states she is exercising Elliptical/Stationary Bike/Dancing/Strength Training(ST)/Treadmill/Stretching 75 minutes 3 times per week.   Interim History:  [place] 6 [place]- able to loss 5 of the 6 lbs and is pleased to be only up 1 lb  Hydration-she drinks "6 glasses of fluid a day- either plain water or Diet Lemonade (0 cal)  She will f/u with Dr. Gherge/Endocrinology August 2024  Subjective:   1. Hyperlipidemia associated with type 2 diabetes mellitus [facility]) Discussed Labs Lipid Panel     Component Value Date/Time   CHOL 133 12/19/2022 1435   TRIG 110 12/19/2022 1435   HDL 46 12/19/2022 1435   CHOLHDL 2.9 12/19/2022 1435   LDLCALC 67 12/19/2022 1435   LABVLDL 20 12/19/2022 1435   The ASCVD Risk score (Arnett DK, et al., 2019) failed to calculate for the following reasons:   The patient has a prior MI or stroke diagnosis  She is on aspirin EC 81 MG tablet  carvedilol (COREG) 3.125 MG tablet  furosemide (LASIX) 40 MG tablet  rosuvastatin (CRESTOR) 40 MG tablet  ranolazine (RANEXA) 1000 MG SR  tablet   2. Hypertension associated with type 2 diabetes mellitus (HCC) Discussed Labs 12/19/2022 CMP- Electrolytes stable. Kidney fx stable- hx of Stage IIIa CKD She is on Carvedilol 3.215 BID  3. Type 2 diabetes mellitus with obesity (HCC) Discussed Labs Lab Results  Component Value Date   HGBA1C 5.7 (H) 12/19/2022   HGBA1C 5.5 08/30/2022   HGBA1C 6.0 (H) 06/06/2022   Home CBG Fasting readings- 6/5 137 6/6/ 126 6/7 124  PP 75 6/9 103 She denies sx's of hypoglycemia She is on weekly Mounjaro 12.5mg - managed by [facility] Denies mass in neck, dysphagia, dyspepsia, persistent hoarseness, abdominal pain, or N/V/C She is on daily (reduced at last [facility] on 12/19/2022) Toujeo 10U- eager to continue to safely wean down dose.  4. Vit D def with Osteopenia Discussed Labs   Latest Reference Range & Units 06/06/22 12:22 12/19/22 14:35  Vitamin D, 25-Hydroxy 30.0 - 100.0 ng/mL 64.2 77.1  She is on daily OTC Multivitamin and Vit D 3 2,000 IU  Assessment/Plan:   1. Hyperlipidemia associated with type 2 diabetes mellitus (HCC) Continue statin therapy per Cards  2. Hypertension associated with type 2 diabetes mellitus (HCC) Continue  aspirin EC 81 MG tablet  carvedilol (COREG) 3.125 MG tablet  furosemide (LASIX) 40 MG tablet  rosuvastatin (CRESTOR) 40 MG tablet  ranolazine (RANEXA) 1000 MG SR tablet    3. Type 2 diabetes mellitus with  obesity (HCC) Refill  tirzepatide (MOUNJARO) 12.5 MG/0.5ML Pen Inject 12.5 mg into the skin once a week on Thursday. Dispense: 2 mL, Refills: 0 ordered   Reduce Toujeo from 10U to 8 U  Continue to CLOSELY monitor fasting and PP CBG  4. Vit D def with Osteopenia Continue OTC supplementation  5. BMI 32.0-32.9,adult-CURRENT BMI 30.6  [name] is currently in the action stage of change. As such, her goal is to continue with weight loss efforts. She has agreed to the Category 2 Plan.   Exercise goals: Older adults should follow the adult guidelines. When  older adults cannot meet the adult guidelines, they should be as physically active as their abilities and conditions will allow.  Older adults should do exercises that maintain or improve balance if they are at risk of falling.  Older adults should determine their level of effort for physical activity relative to their level of fitness.   Behavioral modification strategies: increasing lean protein intake, decreasing simple carbohydrates, increasing vegetables, increasing water intake, no skipping meals, meal planning and cooking strategies, and planning for success.  Daley has agreed to follow-up with our clinic in 3 weeks. She was informed of the importance of frequent follow-up visits to maximize her success with intensive lifestyle modifications for her multiple health conditions.   Objective:   Blood pressure 105/65, pulse 74, temperature 98.2 F (36.8 C), height 5\' 3"  (1.6 m), weight 172 lb (78 kg), SpO2 100 %. Body mass index is 30.47 kg/m.  General: Cooperative, alert, well developed, in no acute distress. HEENT: Conjunctivae and lids unremarkable. Cardiovascular: Regular rhythm.  Lungs: Normal work of breathing. Neurologic: No focal deficits.   Lab Results  Component Value Date   CREATININE 1.11 (H) 12/19/2022   BUN 35 (H) 12/19/2022   NA 141 12/19/2022   K 4.7 12/19/2022   CL 105 12/19/2022   CO2 21 12/19/2022   Lab Results  Component Value Date   ALT 25 12/19/2022   AST 22 12/19/2022   ALKPHOS 108 12/19/2022   BILITOT 0.5 12/19/2022   Lab Results  Component Value Date   HGBA1C 5.7 (H) 12/19/2022   HGBA1C 5.5 08/30/2022   HGBA1C 6.0 (H) 06/06/2022   HGBA1C 6.1 (A) 02/25/2022   HGBA1C 5.8 (A) 10/14/2021   No results found for: "INSULIN" Lab Results  Component Value Date   TSH 1.71 05/30/2018   Lab Results  Component Value Date   CHOL 133 12/19/2022   HDL 46 12/19/2022   LDLCALC 67 12/19/2022   TRIG 110 12/19/2022   CHOLHDL 2.9 12/19/2022   Lab Results   Component Value Date   VD25OH 77.1 12/19/2022   VD25OH 64.2 06/06/2022   VD25OH 63.9 04/21/2020   Lab Results  Component Value Date   WBC 7.5 11/13/2021   HGB 14.0 11/13/2021   HCT 41.3 11/13/2021   MCV 93.0 11/13/2021   PLT 243 11/13/2021   No results found for: "IRON", "TIBC", "FERRITIN"  Attestation Statements:   Reviewed by clinician on day of visit: allergies, medications, problem list, medical history, surgical history, family history, social history, and previous encounter notes.  I have reviewed the above documentation for accuracy and completeness, and I agree with the above. -  Beverlyn Mcginness d. Bethanee Redondo, NP-C

## 2023-01-11 ENCOUNTER — Other Ambulatory Visit (INDEPENDENT_AMBULATORY_CARE_PROVIDER_SITE_OTHER): Payer: Self-pay

## 2023-01-23 ENCOUNTER — Ambulatory Visit (INDEPENDENT_AMBULATORY_CARE_PROVIDER_SITE_OTHER): Payer: Medicare PPO | Admitting: Family Medicine

## 2023-01-23 ENCOUNTER — Encounter (INDEPENDENT_AMBULATORY_CARE_PROVIDER_SITE_OTHER): Payer: Self-pay | Admitting: Family Medicine

## 2023-01-23 VITALS — BP 111/70 | HR 77 | Temp 98.2°F | Ht 63.0 in | Wt 172.0 lb

## 2023-01-23 DIAGNOSIS — E1159 Type 2 diabetes mellitus with other circulatory complications: Secondary | ICD-10-CM | POA: Diagnosis not present

## 2023-01-23 DIAGNOSIS — Z6830 Body mass index (BMI) 30.0-30.9, adult: Secondary | ICD-10-CM | POA: Diagnosis not present

## 2023-01-23 DIAGNOSIS — M8580 Other specified disorders of bone density and structure, unspecified site: Secondary | ICD-10-CM | POA: Diagnosis not present

## 2023-01-23 DIAGNOSIS — E1169 Type 2 diabetes mellitus with other specified complication: Secondary | ICD-10-CM

## 2023-01-23 DIAGNOSIS — Z6832 Body mass index (BMI) 32.0-32.9, adult: Secondary | ICD-10-CM

## 2023-01-23 DIAGNOSIS — Z7985 Long-term (current) use of injectable non-insulin antidiabetic drugs: Secondary | ICD-10-CM | POA: Diagnosis not present

## 2023-01-23 DIAGNOSIS — I152 Hypertension secondary to endocrine disorders: Secondary | ICD-10-CM

## 2023-01-23 DIAGNOSIS — E669 Obesity, unspecified: Secondary | ICD-10-CM

## 2023-01-23 DIAGNOSIS — Z7984 Long term (current) use of oral hypoglycemic drugs: Secondary | ICD-10-CM | POA: Diagnosis not present

## 2023-01-23 NOTE — Progress Notes (Signed)
Pamela Love, D.O.  ABFM, ABOM Specializing in Clinical Bariatric Medicine  Office located at: [address] [place], [place]  [address]     Assessment and Plan:   Hypertension associated with type 2 diabetes mellitus (HCC) Assessment: Blood pressure is stable today. Her HTN is being treated with Coreg 3.125 mg BID and Lasix 40 mg by mouth three times a wk . Denies any adverse effects.   Last 3 blood pressure readings in our office are as follows: BP Readings from Last 3 Encounters:  01/23/23 111/70  01/09/23 105/65  01/06/23 114/72   Plan: Continue with both antihypertensive medications as prescribed by specialist. I discussed with pt that if her blood pressure is 100/60 or less on a regular basis or if she is feeling dizzy/light headed to contact her PCP and discuss possibly making an adjustment in her blood pressure medications.   Ambulatory blood pressure monitoring encouraged.  Reminded patient that if they ever feel poorly in any way, to check their blood pressure and pulse as well.  We will continue to monitor closely alongside PCP/ specialists.  Pt reminded to also f/up with those individuals as instructed by them. We will continue to monitor symptoms as they relate to the her weight loss journey.    Type 2 diabetes mellitus with obesity (HCC) Assessment: Condition is not optimized. Her diabetes mellitus is being treated with Toujeo 8 units q am and Mounjaro 12.5 mg once weekly. Denies any side effects from both medications. She reports not checking her blood sugars at home recently. Denies any symptoms of low/high blood sugars.   Labs below indicate that: Her A1c has slightly increased from 5.5 on 08/30/22 to 5.7 on 12/19/22. Her Serum-creatinine levels are still elevated. Labs were reviewed with patient today and education provided on them.All of the patient's questions about them were answered    Lab Results  Component Value Date   HGBA1C 5.7 (H) 12/19/2022    HGBA1C 5.5 08/30/2022   HGBA1C 6.0 (H) 06/06/2022    Lab Results  Component Value Date   CREATININE 1.11 (H) 12/19/2022   BUN 35 (H) 12/19/2022   NA 141 12/19/2022   K 4.7 12/19/2022   CL 105 12/19/2022   CO2 21 12/19/2022      Component Value Date/Time   PROT 6.7 12/19/2022 1435   ALBUMIN 4.4 12/19/2022 1435   AST 22 12/19/2022 1435   ALT 25 12/19/2022 1435   ALKPHOS 108 12/19/2022 1435   BILITOT 0.5 12/19/2022 1435      Plan: Continue with both medications at current doses.   Unless pre-existing renal or cardiopulmonary conditions exist which patient was told to limit their fluid intake by another provider, I recommended roughly one half of their weight in pounds, to be the approximate ounces of non-caloric, non-caffeinated beverages they should drink per day; including more if they are engaging in exercise. I also discussed the importance of avoiding nephrotoxic substances to protect her kidneys.   Reminded Pamela Love if she feels poorly- check Blood Sugar and Blood Pressure at that time.   Recheck labs in 3 months if not done at Endo provider / PCP. Importance of f/up with PCP and all other specialists, as scheduled, was stressed to the patient today     Vit D def with Osteopenia Assessment: Condition is at goal. Pt has been compliant and tolerant with daily OTC multivitamin and Vit D 3 2,000 lU daily. Denies any adverse effects.   Lab  Results  Component Value Date   VD25OH 77.1 12/19/2022   VD25OH 64.2 06/06/2022   VD25OH 63.9 04/21/2020   Plan: Decrease OTC Vitamin D to 2,000 lU one day and 1,000 lU next day.   Weight loss will likely improve availability of vitamin D, thus encouraged Oluwatamilore to continue with meal plan and their weight loss efforts to further improve this condition.  Thus, we will need to monitor levels regularly (every 3-4 mo on average) to keep levels within normal limits and prevent over supplementation.   TREATMENT PLAN FOR OBESITY: BMI  32.0-32.9,adult-CURRENT BMI 30.48 Morbid obesity (HCC)-START BMI 40.21/DATE 03/17/20 Assessment:  Pamela Love is here to discuss her progress with her obesity treatment plan along with follow-up of her obesity related diagnoses. See Medical Weight Management Flowsheet for complete bioelectrical impedance results.  Condition is improving. Biometric data collected today, was reviewed with patient.   Since last office visit on 01/09/23 patient's  Muscle mass has increased by 4.2 lb. Fat mass has decreased by 5.2 lb. Total body water has increased by 1.2 lb.  Counseling done on how various foods will affect these numbers and how to maximize success  Total lbs lost to date: 55  Total weight loss percentage to date: 24.23  Plan: Continue the Category 2 meal plan.   Behavioral Intervention Additional resources provided today: patient declined Evidence-based interventions for health behavior change were utilized today including the discussion of self monitoring techniques, problem-solving barriers and SMART goal setting techniques.   Regarding patient's less desirable eating habits and patterns, we employed the technique of small changes.  Pt will specifically work on: increase water intake and exercising 45 minutes - 1.5 hr, 3 days a wk for next visit.    Recommended Physical Activity Goals  Yemaya has been advised to slowly work up to 150 minutes of moderate intensity aerobic activity a week and strengthening exercises 2-3 times per week for cardiovascular health, weight loss maintenance and preservation of muscle mass.   She has agreed to Think about ways to increase daily physical activity and overcoming barriers to exercise  FOLLOW UP: Return in about 22 days (around 02/14/2023). She was informed of the importance of frequent follow up visits to maximize her success with intensive lifestyle modifications for her multiple health conditions.  Subjective:   Chief complaint:  Obesity Pamela Love is here to discuss her progress with her obesity treatment plan. She is on the Category 2 Plan and states she is following her eating plan approximately 60% of the time. She states she is doing dancing and cardio at the gym 45  minutes - 1.5 hrs 2 days per week.  Interval History:  Shauntel Prest is here for a follow up office visit.  Since last OV, Yazmin has been doing well. She endorses eating off plan during several celebratory occasions (Father's day, anniversaries, etc). Apart from these events, she has been working on increasing her lean protein intake. Her hunger and cravings are controlled when eating on plan. Her constipation has been under good control with Miralax.   Pharmacotherapy for weight loss: She is currently taking  Mounjaro  for medical weight loss.  Denies side effects.    Review of Systems:  Pertinent positives were addressed with patient today.  Reviewed by clinician on day of visit: allergies, medications, problem list, medical history, surgical history, family history, social history, and previous encounter notes.  Weight Summary and Biometrics   Weight Lost Since Last Visit: 0  Weight Gained  Since Last Visit: 0    Vitals Temp: 98.2 F (36.8 C) BP: 111/70 Pulse Rate: 77 SpO2: 95 %   Anthropometric Measurements Height: 5\' 3"  (1.6 m) Weight: 172 lb (78 kg) BMI (Calculated): 30.48 Weight at Last Visit: 172lb Weight Lost Since Last Visit: 0 Weight Gained Since Last Visit: 0 Starting Weight: 227lb Total Weight Loss (lbs): 55 lb (24.9 kg) Peak Weight: 250lb   Body Composition  Body Fat %: 41.6 % Fat Mass (lbs): 71.6 lbs Muscle Mass (lbs): 95.4 lbs Total Body Water (lbs): 76.2 lbs Visceral Fat Rating : 12   Other Clinical Data Fasting: no Labs: no Today's Visit #: 61 Starting Date: 03/17/20   Objective:   PHYSICAL EXAM: Blood pressure 111/70, pulse 77, temperature 98.2 F (36.8 C), height 5\' 3"  (1.6 m), weight 172 lb  (78 kg), SpO2 95 %. Body mass index is 30.47 kg/m.  General: Well Developed, well nourished, and in no acute distress.  HEENT: Normocephalic, atraumatic Skin: Warm and dry, cap RF less 2 sec, good turgor Chest:  Normal excursion, shape, no gross abn Respiratory: speaking in full sentences, no conversational dyspnea NeuroM-Sk: Ambulates w/o assistance, moves * 4 Psych: A and O *3, insight good, mood-full  DIAGNOSTIC DATA REVIEWED:  BMET    Component Value Date/Time   NA 141 12/19/2022 1435   K 4.7 12/19/2022 1435   CL 105 12/19/2022 1435   CO2 21 12/19/2022 1435   GLUCOSE 83 12/19/2022 1435   GLUCOSE 134 (H) 11/13/2021 1933   BUN 35 (H) 12/19/2022 1435   CREATININE 1.11 (H) 12/19/2022 1435   CALCIUM 10.2 12/19/2022 1435   GFRNONAA 54 (L) 11/13/2021 1933   GFRAA 54 (L) 05/12/2020 1557   Lab Results  Component Value Date   HGBA1C 5.7 (H) 12/19/2022   HGBA1C 6 1 02/09/2015   No results found for: "INSULIN" Lab Results  Component Value Date   TSH 1.71 05/30/2018   CBC    Component Value Date/Time   WBC 7.5 11/13/2021 1933   RBC 4.44 11/13/2021 1933   HGB 14.0 11/13/2021 1933   HGB 13.8 01/26/2021 1427   HCT 41.3 11/13/2021 1933   HCT 41.6 01/26/2021 1427   PLT 243 11/13/2021 1933   PLT 229 01/26/2021 1427   MCV 93.0 11/13/2021 1933   MCV 92 01/26/2021 1427   MCH 31.5 11/13/2021 1933   MCHC 33.9 11/13/2021 1933   RDW 12.1 11/13/2021 1933   RDW 13.2 01/26/2021 1427   Iron Studies No results found for: "IRON", "TIBC", "FERRITIN", "IRONPCTSAT" Lipid Panel     Component Value Date/Time   CHOL 133 12/19/2022 1435   TRIG 110 12/19/2022 1435   HDL 46 12/19/2022 1435   CHOLHDL 2.9 12/19/2022 1435   LDLCALC 67 12/19/2022 1435   Hepatic Function Panel     Component Value Date/Time   PROT 6.7 12/19/2022 1435   ALBUMIN 4.4 12/19/2022 1435   AST 22 12/19/2022 1435   ALT 25 12/19/2022 1435   ALKPHOS 108 12/19/2022 1435   BILITOT 0.5 12/19/2022 1435       Component Value Date/Time   TSH 1.71 05/30/2018 0000   Nutritional Lab Results  Component Value Date   VD25OH 77.1 12/19/2022   VD25OH 64.2 06/06/2022   VD25OH 63.9 04/21/2020    Attestations:   I, Special Puri, acting as a Stage manager for [employer], DO., have compiled all relevant documentation for today's office visit on behalf of [name], DO, while in the presence of  [name], DO.  I have reviewed the above documentation for accuracy and completeness, and I agree with the above. Pamela Love, D.O.  The 21st Century Cures Act was signed into law in 2016 which includes the topic of electronic health records.  This provides immediate access to information in MyChart.  This includes consultation notes, operative notes, office notes, lab results and pathology reports.  If you have any questions about what you read please let us know at your next visit so we can discuss your concerns and take corrective action if need be.  We are right here with you.

## 2023-01-27 DIAGNOSIS — G4733 Obstructive sleep apnea (adult) (pediatric): Secondary | ICD-10-CM | POA: Diagnosis not present

## 2023-01-30 ENCOUNTER — Other Ambulatory Visit (HOSPITAL_BASED_OUTPATIENT_CLINIC_OR_DEPARTMENT_OTHER): Payer: Self-pay

## 2023-02-01 ENCOUNTER — Other Ambulatory Visit (HOSPITAL_BASED_OUTPATIENT_CLINIC_OR_DEPARTMENT_OTHER): Payer: Self-pay

## 2023-02-14 ENCOUNTER — Other Ambulatory Visit (HOSPITAL_BASED_OUTPATIENT_CLINIC_OR_DEPARTMENT_OTHER): Payer: Self-pay

## 2023-02-14 ENCOUNTER — Ambulatory Visit (INDEPENDENT_AMBULATORY_CARE_PROVIDER_SITE_OTHER): Payer: Medicare PPO | Admitting: Adult Health

## 2023-02-14 ENCOUNTER — Encounter (INDEPENDENT_AMBULATORY_CARE_PROVIDER_SITE_OTHER): Payer: Self-pay | Admitting: Adult Health

## 2023-02-14 VITALS — BP 120/73 | Temp 98.0°F | Ht 63.0 in | Wt 171.0 lb

## 2023-02-14 DIAGNOSIS — Z7985 Long-term (current) use of injectable non-insulin antidiabetic drugs: Secondary | ICD-10-CM

## 2023-02-14 DIAGNOSIS — Z6830 Body mass index (BMI) 30.0-30.9, adult: Secondary | ICD-10-CM

## 2023-02-14 DIAGNOSIS — E1169 Type 2 diabetes mellitus with other specified complication: Secondary | ICD-10-CM

## 2023-02-14 DIAGNOSIS — Z6832 Body mass index (BMI) 32.0-32.9, adult: Secondary | ICD-10-CM

## 2023-02-14 DIAGNOSIS — M8580 Other specified disorders of bone density and structure, unspecified site: Secondary | ICD-10-CM

## 2023-02-14 DIAGNOSIS — E559 Vitamin D deficiency, unspecified: Secondary | ICD-10-CM

## 2023-02-14 DIAGNOSIS — E669 Obesity, unspecified: Secondary | ICD-10-CM

## 2023-02-14 MED ORDER — TIRZEPATIDE 12.5 MG/0.5ML ~~LOC~~ SOAJ
12.5000 mg | SUBCUTANEOUS | 0 refills | Status: DC
Start: 2023-02-14 — End: 2023-03-02
  Filled 2023-02-14 – 2023-02-28 (×2): qty 2, 28d supply, fill #0

## 2023-02-14 MED ORDER — TIRZEPATIDE 12.5 MG/0.5ML ~~LOC~~ SOAJ: 12.5000 mg | SUBCUTANEOUS | 0 refills | Status: DC

## 2023-02-14 NOTE — Progress Notes (Signed)
WEIGHT SUMMARY AND BIOMETRICS  Vitals Temp: 98 F (36.7 C) BP: 120/73   Anthropometric Measurements Height: 5\' 3"  (1.6 m) Weight: 171 lb (77.6 kg) BMI (Calculated): 30.3 Weight at Last Visit: 172 lb Weight Lost Since Last Visit: 1 Weight Gained Since Last Visit: 0 Starting Weight: 227lb Total Weight Loss (lbs): 56 lb (25.4 kg) Peak Weight: 250lb   Body Composition  Body Fat %: 44.2 % Fat Mass (lbs): 75.6 lbs Muscle Mass (lbs): 90.8 lbs Total Body Water (lbs): 75.4 lbs Visceral Fat Rating : 13   Other Clinical Data Fasting: no Labs: no Today's Visit #: 13 Starting Date: 03/17/20    Chief Complaint:   OBESITY Pamela Love is here to discuss her progress with her obesity treatment plan. She is on the the Category 2 Plan and states she is following her eating plan approximately 70 % of the time. She states she is exercising 60-75 minutes 2 times per week.   Interim History:  Endocrinology/Dr. Lafe Garin manages Toujeo therapy. Perviously on highest dose Toujeo 140 U QD per [facility] note on 03/2020 Endo Note Instructions 08/30/2022 Please decrease: - Toujeo 14 units daily (after 1 week, if the sugars are mostly <130, you can try to decrease to 14 units)  Pt slowly weaning down Toujeo- currently on 8 U Reviewed home CBG log: Fasting Readings: 134, 117, 127, 115 PP Readings:105, 103, 99, 87, 99, 87  Subjective:   1. Vit D def with Osteopenia  Latest Reference Range & Units 12/19/22 14:35  Vitamin D, 25-Hydroxy 30.0 - 100.0 ng/mL 77.1   At last OV at [facility] on 01/23/23- OTC Vit D 2,000 supplementation was reduced to Decrease OTC Vitamin D to 2,000 lU one day and 1,000 lU next day.   Per pt- she discovered that her OTC Vit D3 strength was 5,000 not 2,000 as she previously thought it was She denies N/V/Muscle Weakness  2. Type 2 diabetes mellitus with obesity (HCC) Lab Results  Component Value Date   HGBA1C 5.7 (H) 12/19/2022   HGBA1C 5.5 08/30/2022   HGBA1C 6.0 (H)  06/06/2022   Reviewed home CBG log: Fasting Readings: 134, 117, 127, 115 PP Readings:105, 103, 99, 87, 99, 87  She currently on Toujeo 8U every day and weekly Mounjaro 12.5mg  Denies mass in neck, dysphagia, dyspepsia, persistent hoarseness, abdominal pain, or N/V/C  She denies sx's of hypoglycemia   Assessment/Plan:   1. Vit D def with Osteopenia Take OTC Vit D3 5000 EVERY 3 DAYS Do not take any other Vit D Supplementation  2. Type 2 diabetes mellitus with obesity (HCC) Refill - tirzepatide (MOUNJARO) 12.5 MG/0.5ML Pen; Inject 12.5 mg into the skin once a week on Thursday.  Dispense: 2 mL; Refill: 0 Reduce To  3. BMI 32.0-32.9,adult-CURRENT BMI 30.48  Pamela Love is currently in the action stage of change. As such, her goal is to continue with weight loss efforts. She has agreed to the Category 2 Plan.   Exercise goals: Older adults should follow the adult guidelines. When older adults cannot meet the adult guidelines, they should be as physically active as their abilities and conditions will allow.  Older adults should determine their level of effort for physical activity relative to their level of fitness.  Older adults with chronic conditions should understand whether and how their conditions affect their ability to do regular physical activity safely.  Behavioral modification strategies: increasing lean protein intake, decreasing simple carbohydrates, increasing vegetables, increasing water intake, no skipping meals, meal planning and  cooking strategies, and planning for success.  Pamela Love has agreed to follow-up with our clinic in 3 weeks. She was informed of the importance of frequent follow-up visits to maximize her success with intensive lifestyle modifications for her multiple health conditions.   Objective:   Blood pressure 120/73, temperature 98 F (36.7 C), height 5\' 3"  (1.6 m), weight 171 lb (77.6 kg). Body mass index is 30.29 kg/m.  General: Cooperative, alert, well  developed, in no acute distress. HEENT: Conjunctivae and lids unremarkable. Cardiovascular: Regular rhythm.  Lungs: Normal work of breathing. Neurologic: No focal deficits.   Lab Results  Component Value Date   CREATININE 1.11 (H) 12/19/2022   BUN 35 (H) 12/19/2022   NA 141 12/19/2022   K 4.7 12/19/2022   CL 105 12/19/2022   CO2 21 12/19/2022   Lab Results  Component Value Date   ALT 25 12/19/2022   AST 22 12/19/2022   ALKPHOS 108 12/19/2022   BILITOT 0.5 12/19/2022   Lab Results  Component Value Date   HGBA1C 5.7 (H) 12/19/2022   HGBA1C 5.5 08/30/2022   HGBA1C 6.0 (H) 06/06/2022   HGBA1C 6.1 (A) 02/25/2022   HGBA1C 5.8 (A) 10/14/2021   No results found for: "INSULIN" Lab Results  Component Value Date   TSH 1.71 05/30/2018   Lab Results  Component Value Date   CHOL 133 12/19/2022   HDL 46 12/19/2022   LDLCALC 67 12/19/2022   TRIG 110 12/19/2022   CHOLHDL 2.9 12/19/2022   Lab Results  Component Value Date   VD25OH 77.1 12/19/2022   VD25OH 64.2 06/06/2022   VD25OH 63.9 04/21/2020   Lab Results  Component Value Date   WBC 7.5 11/13/2021   HGB 14.0 11/13/2021   HCT 41.3 11/13/2021   MCV 93.0 11/13/2021   PLT 243 11/13/2021   No results found for: "IRON", "TIBC", "FERRITIN"  Attestation Statements:   Reviewed by clinician on day of visit: allergies, medications, problem list, medical history, surgical history, family history, social history, and previous encounter notes.  I have reviewed the above documentation for accuracy and completeness, and I agree with the above. -  Asaph Serena d. Amandeep Nesmith, NP-C

## 2023-02-20 ENCOUNTER — Ambulatory Visit (HOSPITAL_BASED_OUTPATIENT_CLINIC_OR_DEPARTMENT_OTHER): Payer: Medicare PPO | Admitting: Cardiology

## 2023-02-28 ENCOUNTER — Other Ambulatory Visit (HOSPITAL_BASED_OUTPATIENT_CLINIC_OR_DEPARTMENT_OTHER): Payer: Self-pay

## 2023-03-01 ENCOUNTER — Ambulatory Visit (INDEPENDENT_AMBULATORY_CARE_PROVIDER_SITE_OTHER): Payer: Medicare PPO | Admitting: Family Medicine

## 2023-03-01 DIAGNOSIS — G4733 Obstructive sleep apnea (adult) (pediatric): Secondary | ICD-10-CM | POA: Diagnosis not present

## 2023-03-02 ENCOUNTER — Other Ambulatory Visit (HOSPITAL_BASED_OUTPATIENT_CLINIC_OR_DEPARTMENT_OTHER): Payer: Self-pay

## 2023-03-02 ENCOUNTER — Encounter (INDEPENDENT_AMBULATORY_CARE_PROVIDER_SITE_OTHER): Payer: Self-pay | Admitting: Family Medicine

## 2023-03-02 ENCOUNTER — Ambulatory Visit (INDEPENDENT_AMBULATORY_CARE_PROVIDER_SITE_OTHER): Payer: Medicare PPO | Admitting: Family Medicine

## 2023-03-02 VITALS — BP 108/72 | HR 75 | Temp 98.2°F | Ht 63.0 in | Wt 170.0 lb

## 2023-03-02 DIAGNOSIS — Z6832 Body mass index (BMI) 32.0-32.9, adult: Secondary | ICD-10-CM

## 2023-03-02 DIAGNOSIS — E669 Obesity, unspecified: Secondary | ICD-10-CM

## 2023-03-02 DIAGNOSIS — Z6831 Body mass index (BMI) 31.0-31.9, adult: Secondary | ICD-10-CM

## 2023-03-02 DIAGNOSIS — E1159 Type 2 diabetes mellitus with other circulatory complications: Secondary | ICD-10-CM

## 2023-03-02 DIAGNOSIS — E1169 Type 2 diabetes mellitus with other specified complication: Secondary | ICD-10-CM | POA: Diagnosis not present

## 2023-03-02 DIAGNOSIS — I152 Hypertension secondary to endocrine disorders: Secondary | ICD-10-CM

## 2023-03-02 DIAGNOSIS — Z7985 Long-term (current) use of injectable non-insulin antidiabetic drugs: Secondary | ICD-10-CM | POA: Diagnosis not present

## 2023-03-02 DIAGNOSIS — Z794 Long term (current) use of insulin: Secondary | ICD-10-CM

## 2023-03-02 DIAGNOSIS — M8580 Other specified disorders of bone density and structure, unspecified site: Secondary | ICD-10-CM

## 2023-03-02 MED ORDER — VITAMIN D 50 MCG (2000 UT) PO CAPS: 2000.0000 [IU] | ORAL_CAPSULE | Freq: Every morning | ORAL | Status: AC

## 2023-03-02 MED ORDER — TIRZEPATIDE 12.5 MG/0.5ML ~~LOC~~ SOAJ
12.5000 mg | SUBCUTANEOUS | 0 refills | Status: DC
Start: 2023-03-02 — End: 2023-04-05
  Filled 2023-03-02: qty 2, 28d supply, fill #1
  Filled 2023-03-02: qty 2, 28d supply, fill #0
  Filled 2023-04-05: qty 2, 28d supply, fill #1

## 2023-03-02 NOTE — Progress Notes (Signed)
Pamela Love, D.O.  ABFM, ABOM Specializing in Clinical Bariatric Medicine  Office located at: [address] [place], [place]  [address]     Assessment and Plan:   Medications Discontinued During This Encounter  Medication Reason   Cholecalciferol (VITAMIN D) 2000 UNITS CAPS Reorder   tirzepatide Pamela Love) 12.5 MG/0.5ML Pen Reorder     Meds ordered this encounter  Medications   tirzepatide (MOUNJARO) 12.5 MG/0.5ML Pen    Sig: Inject 12.5 mg into the skin once a week on Thursday.    Dispense:  6 mL    Refill:  0    Please dispense 90 day of medication for pt's diabetes management if possible   Cholecalciferol (VITAMIN D) 50 MCG (2000 UT) CAPS    Sig: Take 1 capsule (2,000 Units total) by mouth every morning.     Type 2 diabetes mellitus with obesity (HCC) Assessment & Plan:  Per pt, she is currently on Toujeo 6 units daily and Mounjaro 12.5 mg once weekly. Reviewed fasting reading from home CBG LOG: 106, 141, 151. Reports feeling dizzy when blood sugar was 151. In general, Pamela Love notes feeling dizzy about once a month.  Lab Results  Component Value Date   HGBA1C 5.7 (H) 12/19/2022   HGBA1C 5.5 08/30/2022   HGBA1C 6.0 (H) 06/06/2022    To ensure her dizziness is not from blood sugars or other organic causes, I encouraged Pamela Love to regularly check her bp, blood sugar, and pulse. We also discussed that she can feel dizzy if she skips meals or has inadequate sleep. Pt endorses that these could be possible etiologies for her dizziness because she did skip meals and did not sleep well when these episodes of dizziness occurred. If her dizziness continues to persist, we highly encourgage her to follow up with PCP or endocrinologist.   Continue with Pamela Love and Mounjaro at current doses. Will refill Mounjaro today.  Educated patient that having protein with each meal is important for stabilizing sugars and an important part of her diabetes management as well as controlling  hunger and cravings.    Hypertension associated with type 2 diabetes mellitus (HCC) Assessment & Plan: Her blood pressure is treated with Coreg 3.125 mg BID and Lasix 40 mg 3 times a wk. Blood pressure is stable. No concerns in this regard today.   Last 3 blood pressure readings in our office are as follows: BP Readings from Last 3 Encounters:  03/02/23 108/72  02/14/23 120/73  01/23/23 111/70   Continue with both medications per cardiologist. Continue with Prudent nutritional plan and low sodium diet, advance exercise as tolerated. We will continue to monitor closely alongside PCP/ specialists.    Vit D def with Osteopenia Assessment & Plan: On 01/23/23, Pamela Love told us that she was taking OTC Vitamin D3 2,000 international units daily. However, she discovered that in fact her OTC Vit D3 strength was 5,000 units and not 2,000 units as she previously thought. Since visit with Katy on 02/14/23, she has been taking 5,000 units every 3 days.   Lab Results  Component Value Date   VD25OH 77.1 12/19/2022   VD25OH 64.2 06/06/2022   VD25OH 63.9 04/21/2020   Pt has been instructed to start OTC Vitamin D 2,000 units daily when she completes her remaining 5,000 units. Will continue to monitor condition.    TREATMENT PLAN FOR OBESITY: BMI 32.0-32.9,adult-CURRENT BMI 30.12 Obesity, current BMI 31.72 Assessment & Plan: Pamela Love is here to discuss her progress with  her obesity treatment plan along with follow-up of her obesity related diagnoses. See Medical Weight Management Flowsheet for complete bioelectrical impedance results.  Condition is not optimized. Biometric data collected today, was reviewed with patient.   Since last office visit on 02/14/23 patient's  Muscle mass has decreased by 0.2 lb. Fat mass has decreased by 0.8 lb. Total body water has decreased by 3 lb.  Counseling done on how various foods will affect these numbers and how to maximize success  Total lbs lost to  date: 57 lbs.  Total weight loss percentage to date: 25.11%    Continue the Category 2 meal plan   Behavioral Intervention Additional resources provided today: Band exercises handout, Resistance band, and Female Urination Device Handout Evidence-based interventions for health behavior change were utilized today including the discussion of self monitoring techniques, problem-solving barriers and SMART goal setting techniques.   Regarding patient's less desirable eating habits and patterns, we employed the technique of small changes.  Pt will specifically work on: continue to increase lean protein intake and begin Resistance Band exercises 2 set of 10 reps, 2 days a wk for next visit.    Recommended Physical Activity Goals Pamela Love has been advised to slowly work up to 150 minutes of moderate intensity aerobic activity a week and strengthening exercises 2-3 times per week for cardiovascular health, weight loss maintenance and preservation of muscle mass. She has agreed to Think about ways to increase daily physical activity and overcoming barriers to exercise  FOLLOW UP: Return in 2-3 wks. She was informed of the importance of frequent follow up visits to maximize her success with intensive lifestyle modifications for her multiple health conditions.  Subjective:   Chief complaint: Obesity Pamela Love is here to discuss her progress with her obesity treatment plan. She is on the Category 2 Plan and states she is following her eating plan approximately 70% of the time. She states she is doing cardio 45-75 minutes 4 days per week.  Interval History:  Pamela Love is here for a follow up office visit. Since last OV, Pamela Love has been doing well. She has no complaints with her meal plan. Hunger and cravings appear to be well controlled. Reports having an upcoming trip to [place] on August 17.   Pharmacotherapy for weight loss: She is currently taking  Mounjaro 12.5 mg once wkly  for medical weight  loss.  Denies side effects.    Review of Systems:  Pertinent positives were addressed with patient today.  Reviewed by clinician on day of visit: allergies, medications, problem list, medical history, surgical history, family history, social history, and previous encounter notes.  Weight Summary and Biometrics   Weight Lost Since Last Visit: 1lb  Weight Gained Since Last Visit: 0lb   Vitals Temp: 98.2 F (36.8 C) BP: 108/72 Pulse Rate: 75 SpO2: 95 %   Anthropometric Measurements Height: 5\' 3"  (1.6 m) Weight: 170 lb (77.1 kg) BMI (Calculated): 30.12 Weight at Last Visit: 171lb Weight Lost Since Last Visit: 1lb Weight Gained Since Last Visit: 0lb Starting Weight: 227lb Total Weight Loss (lbs): 57 lb (25.9 kg) Peak Weight: 250lb   Body Composition  Body Fat %: 44 % Fat Mass (lbs): 74.8 lbs Muscle Mass (lbs): 90.6 lbs Total Body Water (lbs): 72.4 lbs Visceral Fat Rating : 13   Other Clinical Data Fasting: no Labs: no Today's Visit #: 63 Starting Date: 03/17/20   Objective:   PHYSICAL EXAM: Blood pressure 108/72, pulse 75, temperature 98.2 F (36.8 C), height  5\' 3"  (1.6 m), weight 170 lb (77.1 kg), SpO2 95%. Body mass index is 30.11 kg/m.  General: Well Developed, well nourished, and in no acute distress.  HEENT: Normocephalic, atraumatic Skin: Warm and dry, cap RF less 2 sec, good turgor Chest:  Normal excursion, shape, no gross abn Respiratory: speaking in full sentences, no conversational dyspnea NeuroM-Sk: Ambulates w/o assistance, moves * 4 Psych: A and O *3, insight good, mood-full  DIAGNOSTIC DATA REVIEWED:  BMET    Component Value Date/Time   NA 141 12/19/2022 1435   K 4.7 12/19/2022 1435   CL 105 12/19/2022 1435   CO2 21 12/19/2022 1435   GLUCOSE 83 12/19/2022 1435   GLUCOSE 134 (H) 11/13/2021 1933   BUN 35 (H) 12/19/2022 1435   CREATININE 1.11 (H) 12/19/2022 1435   CALCIUM 10.2 12/19/2022 1435   GFRNONAA 54 (L) 11/13/2021 1933    GFRAA 54 (L) 05/12/2020 1557   Lab Results  Component Value Date   HGBA1C 5.7 (H) 12/19/2022   HGBA1C 6 1 02/09/2015   No results found for: "INSULIN" Lab Results  Component Value Date   TSH 1.71 05/30/2018   CBC    Component Value Date/Time   WBC 7.5 11/13/2021 1933   RBC 4.44 11/13/2021 1933   HGB 14.0 11/13/2021 1933   HGB 13.8 01/26/2021 1427   HCT 41.3 11/13/2021 1933   HCT 41.6 01/26/2021 1427   PLT 243 11/13/2021 1933   PLT 229 01/26/2021 1427   MCV 93.0 11/13/2021 1933   MCV 92 01/26/2021 1427   MCH 31.5 11/13/2021 1933   MCHC 33.9 11/13/2021 1933   RDW 12.1 11/13/2021 1933   RDW 13.2 01/26/2021 1427   Iron Studies No results found for: "IRON", "TIBC", "FERRITIN", "IRONPCTSAT" Lipid Panel     Component Value Date/Time   CHOL 133 12/19/2022 1435   TRIG 110 12/19/2022 1435   HDL 46 12/19/2022 1435   CHOLHDL 2.9 12/19/2022 1435   LDLCALC 67 12/19/2022 1435   Hepatic Function Panel     Component Value Date/Time   PROT 6.7 12/19/2022 1435   ALBUMIN 4.4 12/19/2022 1435   AST 22 12/19/2022 1435   ALT 25 12/19/2022 1435   ALKPHOS 108 12/19/2022 1435   BILITOT 0.5 12/19/2022 1435      Component Value Date/Time   TSH 1.71 05/30/2018 0000   Nutritional Lab Results  Component Value Date   VD25OH 77.1 12/19/2022   VD25OH 64.2 06/06/2022   VD25OH 63.9 04/21/2020    Attestations:   I, Pamela Love , acting as a Stage manager for [employer], DO., have compiled all relevant documentation for today's office visit on behalf of [name], DO, while in the presence of [employer], DO.  I have reviewed the above documentation for accuracy and completeness, and I agree with the above. Pamela Love, D.O.  The 21st Century Cures Act was signed into law in 2016 which includes the topic of electronic health records.  This provides immediate access to information in MyChart.  This includes consultation notes, operative notes, office notes, lab  results and pathology reports.  If you have any questions about what you read please let us know at your next visit so we can discuss your concerns and take corrective action if need be.  We are right here with you.

## 2023-03-14 ENCOUNTER — Ambulatory Visit: Payer: Medicare PPO | Admitting: Internal Medicine

## 2023-03-14 ENCOUNTER — Encounter: Payer: Self-pay | Admitting: Internal Medicine

## 2023-03-14 VITALS — BP 130/74 | HR 78 | Ht 63.0 in | Wt 173.4 lb

## 2023-03-14 DIAGNOSIS — N1831 Chronic kidney disease, stage 3a: Secondary | ICD-10-CM

## 2023-03-14 DIAGNOSIS — E119 Type 2 diabetes mellitus without complications: Secondary | ICD-10-CM

## 2023-03-14 DIAGNOSIS — E1122 Type 2 diabetes mellitus with diabetic chronic kidney disease: Secondary | ICD-10-CM | POA: Diagnosis not present

## 2023-03-14 DIAGNOSIS — Z794 Long term (current) use of insulin: Secondary | ICD-10-CM | POA: Diagnosis not present

## 2023-03-14 DIAGNOSIS — E782 Mixed hyperlipidemia: Secondary | ICD-10-CM

## 2023-03-14 DIAGNOSIS — Z7985 Long-term (current) use of injectable non-insulin antidiabetic drugs: Secondary | ICD-10-CM

## 2023-03-14 LAB — POCT GLYCOSYLATED HEMOGLOBIN (HGB A1C): Hemoglobin A1C: 5.5 % (ref 4.0–5.6)

## 2023-03-14 NOTE — Progress Notes (Addendum)
Patient ID: Pamela Love, female   DOB: 09/05/1950, 72 y.o.   MRN: [id number]  HPI: Pamela Love is a 72 y.o.-year-old female, returning for follow-up for DM2, dx in 2012, insulin-dependent, uncontrolled, with complications (CAD - s/p 2 stents, stage 3 CKD). Pt. previously saw Dr. Everardo All, but last visit with me 6 months ago.  Interim history: No increased urination, blurry vision, nausea, chest pain. She lost between 50-60 lbs since she started to see the [facility] 2.5 years ago.  Reviewed HbA1c: Lab Results  Component Value Date   HGBA1C 5.7 (H) 12/19/2022   HGBA1C 5.5 08/30/2022   HGBA1C 6.0 (H) 06/06/2022   HGBA1C 6.1 (A) 02/25/2022   HGBA1C 5.8 (A) 10/14/2021   HGBA1C 6.1 (A) 07/12/2021   HGBA1C 5.8 (A) 04/12/2021   HGBA1C 7.0 (A) 12/10/2020   HGBA1C 6.3 (A) 09/02/2020   HGBA1C 6.5 (A) 05/27/2020   Pt is on a regimen of: - Toujeo 16 >> 18 >> 6 units daily - Mounjaro 7.5 >> 10 >> 12.5 >> 10 >> 12.5 mg weekly - [facility] She previously tried Victoza and Ozempic. She tried Trulicity and Ozempic >> nausea She tried [place] >> vaginal yeast infections She tried repaglinide >> diarrhea Reviewing Dr. George Hugh last note, she declined multiple daily injections in the past.  Pt checks her sugars 1x a day and they are: - am: 152-170 >> 101-150, 154, 169 >> 106-141, 151 - 2h after b'fast: n/c - before lunch: 110 >> 88, 90, 128 >> 98-115 - 2h after lunch: n/c - before dinner: 126 >> 109, 120 >> 75-103 - 2h after dinner: n/c >> 93 >> n/c - bedtime: 87-114 >> 74-119 >> 84-119 - nighttime: n/c Lowest sugar was 87 >> 74 >> 75; she has hypoglycemia awareness at 70.  Highest sugar was 180 >> 169 >> 151  Glucometer:Accu Chek  - + CKD, last BUN/creatinine:  Lab Results  Component Value Date   BUN 35 (H) 12/19/2022   BUN 39 (A) 07/18/2022   CREATININE 1.11 (H) 12/19/2022   CREATININE 1.0 07/18/2022   07/19/2022: 39/1.03, GFR 57, ACR 27.6 She is not on ACE inhibitor/ARB.  -+ HL; last set of lipids: Lab Results  Component Value Date   CHOL 133 12/19/2022   HDL 46 12/19/2022   LDLCALC 67 12/19/2022   TRIG 110 12/19/2022   CHOLHDL 2.9 12/19/2022  07/19/2022: 129/109/50/60 07/07/2021: 158/143/57/77 She is on Crestor 40 mg daily.  - last eye exam was in 09/2022. No DR reportedly. Dr. Randon Goldsmith.  - no numbness and tingling in her feet.  Last foot exam 08/30/2022.  She also has a history of GERD, OSA, OA, osteoporosis, obesity.  She lost 28-30 lbs since 2021 - [facility].  She adapted well to the diet, despite the fact that she describes having a sweet tooth.  ROS: + see HPI  Past Medical History:  Diagnosis Date   Abnormal nuclear stress test 01/02/2020   Arthritis    Bilateral leg edema 01/02/2020   Bursitis    hips   C. difficile colitis 12/2017   CAD (coronary artery disease) 01/06/2020   CKD (chronic kidney disease), stage III (HCC)    Complication of anesthesia    "spinal did not work with second c section"   Diabetes mellitus (HCC) 11/19/2015   Diabetes mellitus, type 2 (HCC)    diet controlled - has Rx for Glymipride but has not started taking yet  Dyslipidemia 10/21/2014   GERD (gastroesophageal reflux disease)    History of nonmelanoma skin cancer    History of skin cancer    Hypercholesterolemia 12/11/2019   Hyperlipidemia    Hypertension    Hypertensive disorder 10/21/2014   Lower extremity edema    Mixed hyperlipidemia 01/02/2020   OAB (overactive bladder)    Obesity (BMI 30-39.9) 05/12/2020   OSA (obstructive sleep apnea) 05/12/2020   Osteoarthritis of right knee 05/15/2015   Osteoarthrosis, unspecified whether generalized or localized, involving lower leg 10/21/2014   Osteoporosis    Over weight    Primary osteoarthritis of left knee 12/18/2014   S/P knee replacement 12/18/2014   S/P total knee replacement using cement 05/15/2015    Sleep apnea    Tachycardia 12/11/2019   Past Surgical History:  Procedure Laterality Date   ABDOMINAL HYSTERECTOMY  2005   CATARACT EXTRACTION     CESAREAN SECTION     x2   colonscopy      CORONARY PRESSURE/FFR STUDY N/A 01/06/2020   Procedure: INTRAVASCULAR PRESSURE WIRE/FFR STUDY;  Surgeon: Runell Gess, MD;  Location: [facility];  Service: Cardiovascular;  Laterality: N/A;   CORONARY STENT INTERVENTION N/A 01/06/2020   Procedure: CORONARY STENT INTERVENTION;  Surgeon: Runell Gess, MD;  Location: [facility];  Service: Cardiovascular;  Laterality: N/A;   EYE SURGERY     bilat cataract surgery 2015   LEFT HEART CATH AND CORONARY ANGIOGRAPHY N/A 01/06/2020   Procedure: LEFT HEART CATH AND CORONARY ANGIOGRAPHY;  Surgeon: Runell Gess, MD;  Location: [facility];  Service: Cardiovascular;  Laterality: N/A;   LEFT HEART CATH AND CORONARY ANGIOGRAPHY N/A 01/29/2021   Procedure: LEFT HEART CATH AND CORONARY ANGIOGRAPHY;  Surgeon: Corky Crafts, MD;  Location: [facility] INV[facility]IVE [facility];  Service: Cardiovascular;  Laterality: N/A;   TOTAL KNEE ARTHROPLASTY Left 12/18/2014   Procedure: TOTAL LEFT KNEE ARTHROPLASTY;  Surgeon: Eugenia Mcalpine, MD;  Location: [facility];  Service: Orthopedics;  Laterality: Left;   TOTAL KNEE ARTHROPLASTY Right 05/15/2015   Procedure: RIGHT TOTAL KNEE ARTHROPLASTY;  Surgeon: Eugenia Mcalpine, MD;  Location: [facility];  Service: Orthopedics;  Laterality: Right;   UTERINE FIBROID SURGERY     Social History   Socioeconomic History   Marital status: Married    Spouse name: Not on file   Number of children: Not on file   Years of education: Not on file   Highest education level: Not on file  Occupational History   Occupation: retired  Tobacco Use   Smoking status: Never   Smokeless tobacco: Never  Vaping Use   Vaping status: Never Used  Substance and Sexual Activity   Alcohol use: Yes    Comment: rare   Drug use: No   Sexual activity:  Not on file  Other Topics Concern   Not on file  Social History Narrative   Not on file   Social Determinants of Health   Financial Resource Strain: Not on file  Food Insecurity: Not on file  Transportation Needs: Not on file  Physical Activity: Not on file  Stress: Not on file  Social Connections: Not on file  Intimate Partner Violence: Not on file   Current Outpatient Medications on File Prior to Visit  Medication Sig Dispense Refill   Accu-Chek Softclix Lancets lancets USE TO CHECK BLOOD SUGAR TWICE DAILY 200 each 3   acetaminophen (TYLENOL) 650 MG CR tablet Take 1,300 mg by mouth at bedtime.  aspirin EC 81 MG tablet Take 1 tablet (81 mg total) by mouth in the morning.     Blood Glucose Monitoring Suppl (ACCU-CHEK GUIDE ME) w/Device KIT 1 each by Does not apply route 2 (two) times daily. E11.9 1 kit 0   carvedilol (COREG) 3.125 MG tablet Take 1 tablet (3.125 mg total) by mouth 2 (two) times daily with a meal. 180 tablet 3   Cholecalciferol (VITAMIN D) 50 MCG (2000 UT) CAPS Take 1 capsule (2,000 Units total) by mouth every morning.     clopidogrel (PLAVIX) 75 MG tablet TAKE 1 TABLET BY MOUTH DAILY WITH BREAKFAST. 90 tablet 2   Coenzyme Q10 (COQ10) 200 MG CAPS Take 200 mg by mouth in the morning.     DULoxetine (CYMBALTA) 20 MG capsule Take 40 mg by mouth at bedtime.      furosemide (LASIX) 40 MG tablet TAKE 1 TABLET (40 MG TOTAL) BY MOUTH 3 (THREE) TIMES A WEEK. TUESDAYS, THURSDAYS & SATURDAYS IN THE MORNING 36 tablet 5   glucose blood (ACCU-CHEK GUIDE) test strip Use to check BS 2x a day 200 each 12   insulin glargine, 2 Unit Dial, (TOUJEO MAX SOLOSTAR) 300 UNIT/ML Solostar Pen Inject 18 Units into the skin daily. (Patient taking differently: Inject 8 Units into the skin daily.) 6 mL 3   Insulin Pen Needle (BD PEN NEEDLE NANO U/F) 32G X 4 MM MISC 1 each by Does not apply route daily. 100 each 3   KLOR-CON M20 20 MEQ tablet TAKE 1 TABLET (20 MEQ TOTAL) BY MOUTH 3 (THREE) TIMES A  WEEK. TUESDAYS, THURSDAYS & SATURDAYS IN THE MORNING 36 tablet 5   Multiple Vitamin (MULTIVITAMIN WITH MINERALS) TABS tablet Take 1 tablet by mouth every morning.      nitroGLYCERIN (NITROSTAT) 0.4 MG SL tablet PLACE 1 TABLET (0.4 MG TOTAL) UNDER THE TONGUE EVERY 5 (FIVE) MINUTES X 3 DOSES AS NEEDED FOR CHEST PAIN. 75 tablet 1   Omega-3 Fatty Acids (RA FISH OIL) 1400 MG CPDR Take 1,400 mg by mouth 2 (two) times daily.     pantoprazole (PROTONIX) 40 MG tablet Take 40 mg by mouth daily.     ranolazine (RANEXA) 1000 MG SR tablet TAKE 1 TABLET BY MOUTH TWICE A DAY 180 tablet 2   rosuvastatin (CRESTOR) 40 MG tablet TAKE 1 TABLET (40 MG TOTAL) BY MOUTH IN THE MORNING 90 tablet 3   saccharomyces boulardii (FLORASTOR) 250 MG capsule Take 250 mg by mouth in the morning.     tirzepatide (MOUNJARO) 10 MG/0.5ML Pen Inject 10 mg into the skin once a week. 6 mL 5   tirzepatide (MOUNJARO) 12.5 MG/0.5ML Pen Inject 12.5 mg into the skin once a week on Thursday. 6 mL 0   trospium (SANCTURA) 20 MG tablet Take 20 mg by mouth 2 (two) times daily.  3   vitamin C (ASCORBIC ACID) 250 MG tablet Take 250 mg by mouth in the morning.     No current facility-administered medications on file prior to visit.   Allergies  Allergen Reactions   Amoxicillin-Pot Clavulanate     Other reaction(s): vomiting   Aspirin     Other reaction(s): reflux and nervousness   Colchicine     Stomach cramps Other reaction(s): severe GI upset   Diclofenac     Oral causes stomach cramps   Dulaglutide Diarrhea and Nausea And Vomiting    Other reaction(s): diarrhea   Empagliflozin     Other reaction(s): yeast infection   Ezetimibe  Muscle pain Other reaction(s): muscle aches   Imdur [Isosorbide Nitrate] Other (See Comments)    hypotensive   Isosorbide Other (See Comments)    Causes Hypotension   Metformin     Other reaction(s): stomach upset   Repaglinide     Other reaction(s): swelling and GI upset   Sitagliptin-Metformin Hcl  Nausea And Vomiting    Lightheaded  Other reaction(s): GI upset   Sulfamethoxazole-Trimethoprim Itching    Other reaction(s): itching   Imipramine Rash   Tape Rash    Paper tape ok to use    Family History  Problem Relation Age of Onset   Diabetes Maternal Grandfather    Heart attack Maternal Grandfather    Heart disease Mother    Hypertension Mother    Hyperlipidemia Mother    Cancer Mother    Heart attack Father    Hypertension Father    Hyperlipidemia Father    Heart disease Father    Sudden death Father    Sleep apnea Father    Heart attack Sister    Heart attack Paternal Uncle    Congenital heart disease Son    PE: BP 130/74   Pulse 78   Ht 5\' 3"  (1.6 m)   Wt 173 lb 6.4 oz (78.7 kg)   SpO2 99%   BMI 30.72 kg/m  Wt Readings from Last 3 Encounters:  03/14/23 173 lb 6.4 oz (78.7 kg)  03/02/23 170 lb (77.1 kg)  02/14/23 171 lb (77.6 kg)   Constitutional: overweight, in NAD Eyes: no exophthalmos ENT: no thyromegaly, no cervical lymphadenopathy Cardiovascular: RRR, No MRG Respiratory: CTA B Musculoskeletal: no deformities Skin: no rashes Neurological: no tremor with outstretched hands  ASSESSMENT: 1. DM2, non-insulin-dependent, uncontrolled, without long-term complications - CAD - s/p 2 stents - stage 3 CKD  2. HL  3.  Obesity class II  PLAN:  1. Patient with longstanding, uncontrolled, type 2 diabetes, on injectable antidiabetic regimen with long-acting insulin and GLP-1/GIP receptor agonist.  Her Mounjaro was previously adjusted by Dr. Dalbert Garnet in the weight management clinic.  At last visit, she was taking 12.5 mg weekly but since then she had to decrease the dose to 10 mg weekly due to lack of availability of the higher dose.  At last visit, sugars were excellent and HbA1c was 5.5%, in the normal range.  We reduced her Toujeo dose.  She had another HbA1c obtained 3 months ago which was slightly higher, at 5.7%, but still excellent. -At today's visit,  sugars are slightly higher than goal in the morning, but they are at goal later in the day.  She was able to decrease the dose of Toujeo further, now taking only 6 units daily.  She is now back on the 12.5 mg of Mounjaro.  At today's visit, we discussed about coming off of insulin, but I recommended to check for insulin production and pancreatic antibodies before doing so.  We will order these today.  If the tests are normal, we will go ahead and stop the insulin and continue just [facility]. - I suggested to:  Patient Instructions  Please continue: - Toujeo 6 units daily - Mounjaro 12.5 mg weekly  Please stop at the lab.  If your insulin production is normal, you can stop insulin.   Please return in 6 months with your sugar log.   - we checked her HbA1c: 5.5% (lower) - advised to check sugars at different times of the day - 1x a day, rotating check times -  advised for yearly eye exams >> she is UTD - return to clinic in 6 months  2. HL - Reviewed latest lipid panel from 3 months ago: Fractions at goal, except LDL slightly higher than our goal of less than 55: Lab Results  Component Value Date   CHOL 133 12/19/2022   HDL 46 12/19/2022   LDLCALC 67 12/19/2022   TRIG 110 12/19/2022   CHOLHDL 2.9 12/19/2022  - Continues Crestor 40 mg daily without side effects  3.  Obesity class II -She continues on Mounjaro which should also help with weight loss -She lost 18 pounds before last visit, previously lost almost 30 pounds -since last OV, she lost 13 more lbs -She is wondering whether she should lose more weight.  We discussed that reaching a BMI of 25 or lower would be a good goal.   Component     Latest Ref Rng 03/14/2023  Hemoglobin A1C     4.0 - 5.6 % 5.5   Glucose     65 - 99 mg/dL 92   C-Peptide     1.61 - 3.85 ng/mL 5.79 (H)   IA-2 Antibody     <5.4 U/mL <5.4   ZNT8 Antibodies     <15 U/mL <10   Glutamic Acid Decarb Ab     <5 IU/mL <5   No signs of insulin deficiency or  positive pancreatic autoimmunity.  We can go ahead and stop her insulin.  Carlus Pavlov, MD PhD [facility] Endocr[facility]

## 2023-03-14 NOTE — Patient Instructions (Addendum)
Please continue: - Toujeo 6 units daily - Mounjaro 12.5 mg weekly  Please stop at the lab.  If your insulin production is normal, you can stop insulin.   Please return in 6 months with your sugar log.

## 2023-03-16 ENCOUNTER — Ambulatory Visit (INDEPENDENT_AMBULATORY_CARE_PROVIDER_SITE_OTHER): Payer: Medicare PPO | Admitting: Adult Health

## 2023-03-16 ENCOUNTER — Encounter (INDEPENDENT_AMBULATORY_CARE_PROVIDER_SITE_OTHER): Payer: Self-pay | Admitting: Adult Health

## 2023-03-16 VITALS — BP 103/65 | HR 70 | Temp 98.2°F | Ht 63.0 in | Wt 168.0 lb

## 2023-03-16 DIAGNOSIS — E669 Obesity, unspecified: Secondary | ICD-10-CM | POA: Diagnosis not present

## 2023-03-16 DIAGNOSIS — Z794 Long term (current) use of insulin: Secondary | ICD-10-CM | POA: Diagnosis not present

## 2023-03-16 DIAGNOSIS — E1169 Type 2 diabetes mellitus with other specified complication: Secondary | ICD-10-CM

## 2023-03-16 DIAGNOSIS — Z7985 Long-term (current) use of injectable non-insulin antidiabetic drugs: Secondary | ICD-10-CM | POA: Diagnosis not present

## 2023-03-16 DIAGNOSIS — Z6829 Body mass index (BMI) 29.0-29.9, adult: Secondary | ICD-10-CM

## 2023-03-16 NOTE — Progress Notes (Signed)
WEIGHT SUMMARY AND BIOMETRICS  Vitals Temp: 98.2 F (36.8 C) BP: 103/65 Pulse Rate: 70 SpO2: 96 %   Anthropometric Measurements Height: 5\' 3"  (1.6 m) Weight: 168 lb (76.2 kg) BMI (Calculated): 29.77 Weight at Last Visit: 170lb Weight Lost Since Last Visit: 2lb Weight Gained Since Last Visit: 0lb Starting Weight: 227lb Total Weight Loss (lbs): 59 lb (26.8 kg) Peak Weight: 250lb   Body Composition  Body Fat %: 43.3 % Fat Mass (lbs): 72.8 lbs Muscle Mass (lbs): 90.6 lbs Total Body Water (lbs): 72.4 lbs Visceral Fat Rating : 12   Other Clinical Data Fasting: No Labs: No Today's Visit #: 28 Starting Date: 03/17/20    Chief Complaint:   OBESITY Pamela Love is here to discuss her progress with her obesity treatment plan. She is on the the Category 2 Plan and states she is following her eating plan approximately 70 % of the time. She states she is exercising Gym and Dancing 90 minutes 2 times per week.   Interim History:  03/14/2023 Endocrinology reduced daily Toujeo to 6 U and she has maintained on weekly Mounjaro 12.6mg   Home CBG levels: Fasting 112, 108 PP 98, 118, 104, 124, 95, 84, 100  Current weight 166 lbs with corresponding BMI 29.8  Interval gaol loss down to 160 lbs with corresponding BMI 28.3  Subjective:   1. Type 2 diabetes mellitus with obesity (HCC) 03/14/2023 Endo/Dr. Lafe Love OV notes Interim history: No increased urination, blurry vision, nausea, chest pain. She lost between 50-60 lbs since she started to see the [facility] 2.5 years ago. Pt is on a regimen of: - Toujeo 16 >> 18 >> 6 units daily - Mounjaro 7.5 >> 10 >> 12.5 >> 10 >> 12.5 mg weekly - titration per the [facility] She previously tried Victoza and Ozempic. She tried Trulicity and Ozempic >> nausea She tried [place] >> vaginal yeast infections She tried repaglinide >> diarrhea  PLAN:  1. Patient with longstanding, uncontrolled, type 2  diabetes, on injectable antidiabetic regimen with long-acting insulin and GLP-1/GIP receptor agonist.  Her Mounjaro was previously adjusted by Dr. Dalbert Love in the weight management clinic.  At last visit, she was taking 12.5 mg weekly but since then she had to decrease the dose to 10 mg weekly due to lack of availability of the higher dose.  At last visit, sugars were excellent and HbA1c was 5.5%, in the normal range.  We reduced her Toujeo dose.  She had another HbA1c obtained 3 months ago which was slightly higher, at 5.7%, but still excellent. -At today's visit, sugars are slightly higher than goal in the morning, but they are at goal later in the day.  She was able to decrease the dose of Toujeo further, now taking only 6 units daily.  She is now back on the 12.5 mg of Mounjaro.  At today's visit, we discussed about coming off of insulin, but I recommended to check for insulin production and pancreatic antibodies before doing so.  We will order these today.  If the tests are normal, we will go ahead and stop the insulin and continue just [facility].  Assessment/Plan:   1. Type 2 diabetes mellitus with obesity (HCC) Continue weekly Mounjaro 12.5mg  and daily Toujeo 6 U F/u with Endocrinology recommendationa on Toujeo titrations. Continue to closely monitor fasting and PP CBGs  2. Obesity, current BMI 31.72  [name] is currently in the action stage of change. As such, her goal is to continue  with weight loss efforts. She has agreed to the Category 2 Plan.   Exercise goals: Older adults should follow the adult guidelines. When older adults cannot meet the adult guidelines, they should be as physically active as their abilities and conditions will allow.  Older adults should do exercises that maintain or improve balance if they are at risk of falling.  Older adults should determine their level of effort for physical activity relative to their level of fitness.  Older adults with chronic conditions should  understand whether and how their conditions affect their ability to do regular physical activity safely.  Behavioral modification strategies: increasing lean protein intake, decreasing simple carbohydrates, increasing vegetables, increasing water intake, no skipping meals, meal planning and cooking strategies, and planning for success.  Pamela Love has agreed to follow-up with our clinic in 2 weeks. She was informed of the importance of frequent follow-up visits to maximize her success with intensive lifestyle modifications for her multiple health conditions.  Objective:   Blood pressure 103/65, pulse 70, temperature 98.2 F (36.8 C), height 5\' 3"  (1.6 m), weight 168 lb (76.2 kg), SpO2 96%. Body mass index is 29.76 kg/m.  General: Cooperative, alert, well developed, in no acute distress. HEENT: Conjunctivae and lids unremarkable. Cardiovascular: Regular rhythm.  Lungs: Normal work of breathing. Neurologic: No focal deficits.   Lab Results  Component Value Date   CREATININE 1.11 (H) 12/19/2022   BUN 35 (H) 12/19/2022   NA 141 12/19/2022   K 4.7 12/19/2022   CL 105 12/19/2022   CO2 21 12/19/2022   Lab Results  Component Value Date   ALT 25 12/19/2022   AST 22 12/19/2022   ALKPHOS 108 12/19/2022   BILITOT 0.5 12/19/2022   Lab Results  Component Value Date   HGBA1C 5.5 03/14/2023   HGBA1C 5.7 (H) 12/19/2022   HGBA1C 5.5 08/30/2022   HGBA1C 6.0 (H) 06/06/2022   HGBA1C 6.1 (A) 02/25/2022   No results found for: "INSULIN" Lab Results  Component Value Date   TSH 1.71 05/30/2018   Lab Results  Component Value Date   CHOL 133 12/19/2022   HDL 46 12/19/2022   LDLCALC 67 12/19/2022   TRIG 110 12/19/2022   CHOLHDL 2.9 12/19/2022   Lab Results  Component Value Date   VD25OH 77.1 12/19/2022   VD25OH 64.2 06/06/2022   VD25OH 63.9 04/21/2020   Lab Results  Component Value Date   WBC 7.5 11/13/2021   HGB 14.0 11/13/2021   HCT 41.3 11/13/2021   MCV 93.0 11/13/2021   PLT 243  11/13/2021   No results found for: "IRON", "TIBC", "FERRITIN"  Attestation Statements:   Reviewed by clinician on day of visit: allergies, medications, problem list, medical history, surgical history, family history, social history, and previous encounter notes.  Time spent on visit including pre-visit chart review and post-visit care and charting was 27 minutes.   I have reviewed the above documentation for accuracy and completeness, and I agree with the above. -   d. , NP-C

## 2023-03-17 ENCOUNTER — Encounter: Payer: Self-pay | Admitting: Internal Medicine

## 2023-04-05 ENCOUNTER — Other Ambulatory Visit: Payer: Self-pay

## 2023-04-05 ENCOUNTER — Encounter (INDEPENDENT_AMBULATORY_CARE_PROVIDER_SITE_OTHER): Payer: Self-pay | Admitting: Adult Health

## 2023-04-05 ENCOUNTER — Other Ambulatory Visit (HOSPITAL_BASED_OUTPATIENT_CLINIC_OR_DEPARTMENT_OTHER): Payer: Self-pay

## 2023-04-05 ENCOUNTER — Ambulatory Visit (INDEPENDENT_AMBULATORY_CARE_PROVIDER_SITE_OTHER): Payer: Medicare PPO | Admitting: Adult Health

## 2023-04-05 VITALS — BP 99/61 | HR 83 | Temp 99.1°F | Ht 63.0 in | Wt 171.0 lb

## 2023-04-05 DIAGNOSIS — Z6830 Body mass index (BMI) 30.0-30.9, adult: Secondary | ICD-10-CM | POA: Diagnosis not present

## 2023-04-05 DIAGNOSIS — Z7985 Long-term (current) use of injectable non-insulin antidiabetic drugs: Secondary | ICD-10-CM | POA: Diagnosis not present

## 2023-04-05 DIAGNOSIS — L814 Other melanin hyperpigmentation: Secondary | ICD-10-CM | POA: Diagnosis not present

## 2023-04-05 DIAGNOSIS — E878 Other disorders of electrolyte and fluid balance, not elsewhere classified: Secondary | ICD-10-CM | POA: Diagnosis not present

## 2023-04-05 DIAGNOSIS — L821 Other seborrheic keratosis: Secondary | ICD-10-CM | POA: Diagnosis not present

## 2023-04-05 DIAGNOSIS — Z86018 Personal history of other benign neoplasm: Secondary | ICD-10-CM | POA: Diagnosis not present

## 2023-04-05 DIAGNOSIS — E669 Obesity, unspecified: Secondary | ICD-10-CM

## 2023-04-05 DIAGNOSIS — E1169 Type 2 diabetes mellitus with other specified complication: Secondary | ICD-10-CM

## 2023-04-05 DIAGNOSIS — L570 Actinic keratosis: Secondary | ICD-10-CM | POA: Diagnosis not present

## 2023-04-05 DIAGNOSIS — Z85828 Personal history of other malignant neoplasm of skin: Secondary | ICD-10-CM | POA: Diagnosis not present

## 2023-04-05 DIAGNOSIS — D225 Melanocytic nevi of trunk: Secondary | ICD-10-CM | POA: Diagnosis not present

## 2023-04-05 DIAGNOSIS — L578 Other skin changes due to chronic exposure to nonionizing radiation: Secondary | ICD-10-CM | POA: Diagnosis not present

## 2023-04-05 MED ORDER — TIRZEPATIDE 12.5 MG/0.5ML ~~LOC~~ SOAJ
12.5000 mg | SUBCUTANEOUS | 0 refills | Status: DC
Start: 2023-04-05 — End: 2023-06-19
  Filled 2023-04-05 (×2): qty 2, 28d supply, fill #0
  Filled 2023-05-10: qty 2, 28d supply, fill #1
  Filled 2023-06-02: qty 2, 28d supply, fill #2

## 2023-04-05 NOTE — Progress Notes (Signed)
WEIGHT SUMMARY AND BIOMETRICS  Vitals Temp: 99.1 F (37.3 C) BP: 99/61 Pulse Rate: 83 SpO2: 98 %   Anthropometric Measurements Height: 5\' 3"  (1.6 m) Weight: 171 lb (77.6 kg) BMI (Calculated): 30.3 Weight at Last Visit: 168 lb Weight Lost Since Last Visit: 0 Weight Gained Since Last Visit: 3 lb Starting Weight: 227 lb Total Weight Loss (lbs): 56 lb (25.4 kg) Peak Weight: 250 lb   Body Composition  Body Fat %: 43.7 % Fat Mass (lbs): 75 lbs Muscle Mass (lbs): 71.8 lbs Total Body Water (lbs): 77.6 lbs Visceral Fat Rating : 13   Other Clinical Data Fasting: no Labs: no Today's Visit #: 65 Starting Date: 03/17/20    Chief Complaint:   OBESITY Pamela Love is here to discuss her progress with her obesity treatment plan. She is on the the Category 2 Plan and states she is following her eating plan approximately 50 % of the time. She states she is not currently exercising.   Interim History:  Pamela Love and her family recently travelled to [place] and [place] for a 9 day trip. She was quite active, however consumed 3 "heavier" meals each day.  Due to excellent Antibody results, Dr.Gherghe/Endocrinology instructed her to stop daily Toujeo 6 U injections- SHE IS THRILLED!   Current weight 171 lbs with corresponding BMI 30.4 New weight goal 160 lbs with corresponding BMI 28.3   Subjective:   1. Type 2 diabetes mellitus with obesity (HCC) Lab Results  Component Value Date   HGBA1C 5.5 03/14/2023   HGBA1C 5.7 (H) 12/19/2022   HGBA1C 5.5 08/30/2022     Latest Reference Range & Units 03/14/23 14:13  Glucose 65 - 99 mg/dL 92  IA-2 Antibody <6.4 U/mL <5.4  ZNT8 Antibodies <15 U/mL <10  Glutamic Acid Decarb Ab <5 IU/mL <5  C-Peptide 0.80 - 3.85 ng/mL 5.79 (H)  (H): Data is abnormally high  Endocrinology/Dr. Elvera Lennox recently completed antibody panel She was instructed to stop Toujeo 6 U  Last dose was 03/17/2023  She has continued weekly Mounjaro 12.5  mg Denies mass in neck, dysphagia, dyspepsia, persistent hoarseness, abdominal pain, or N/V/worsening constipation  Since stopping Toujeo 6 U Fasting CBG 141, 140, 136 PP 114, 94  2. Impaired Hydration State BP Soft today She estimates to drink 40-50 oz fluids/day She is on  aspirin EC 81 MG tablet  carvedilol (COREG) 3.125 MG tablet  furosemide (LASIX) 40 MG tablet  rosuvastatin (CRESTOR) 40 MG tablet  ranolazine (RANEXA) 1000 MG SR tablet   Assessment/Plan:   1. Type 2 diabetes mellitus with obesity (HCC) Refill - tirzepatide (MOUNJARO) 12.5 MG/0.5ML Pen; Inject 12.5 mg into the skin once a week on Thursday.  Dispense: 6 mL; Refill: 0  2. Impaired Hydration State Increase water intake- strive for at least 64 oz/day  3. Obesity, current BMI 30.4  Pamela Love is currently in the action stage of change. As such, her goal is to continue with weight loss efforts. She has agreed to the Category 2 Plan.   Exercise goals: Older adults should follow the adult guidelines. When older adults cannot meet the adult guidelines, they should be as physically active as their abilities and conditions will allow.  Older adults should do exercises that maintain or improve balance if they are at risk of falling.  Older adults should determine their level of effort for physical activity relative to their level of fitness.  Older adults with chronic conditions should understand whether and how their conditions affect  their ability to do regular physical activity safely.  Behavioral modification strategies: increasing lean protein intake, decreasing simple carbohydrates, increasing vegetables, increasing water intake, no skipping meals, meal planning and cooking strategies, keeping healthy foods in the home, and planning for success.  [name] has agreed to follow-up with our clinic in 2 weeks. She was informed of the importance of frequent follow-up visits to maximize her success with intensive lifestyle  modifications for her multiple health conditions.   Objective:   Blood pressure 99/61, pulse 83, temperature 99.1 F (37.3 C), height 5\' 3"  (1.6 m), weight 171 lb (77.6 kg), SpO2 98%. Body mass index is 30.29 kg/m.  General: Cooperative, alert, well developed, in no acute distress. HEENT: Conjunctivae and lids unremarkable. Cardiovascular: Regular rhythm.  Lungs: Normal work of breathing. Neurologic: No focal deficits.   Lab Results  Component Value Date   CREATININE 1.11 (H) 12/19/2022   BUN 35 (H) 12/19/2022   NA 141 12/19/2022   K 4.7 12/19/2022   CL 105 12/19/2022   CO2 21 12/19/2022   Lab Results  Component Value Date   ALT 25 12/19/2022   AST 22 12/19/2022   ALKPHOS 108 12/19/2022   BILITOT 0.5 12/19/2022   Lab Results  Component Value Date   HGBA1C 5.5 03/14/2023   HGBA1C 5.7 (H) 12/19/2022   HGBA1C 5.5 08/30/2022   HGBA1C 6.0 (H) 06/06/2022   HGBA1C 6.1 (A) 02/25/2022   No results found for: "INSULIN" Lab Results  Component Value Date   TSH 1.71 05/30/2018   Lab Results  Component Value Date   CHOL 133 12/19/2022   HDL 46 12/19/2022   LDLCALC 67 12/19/2022   TRIG 110 12/19/2022   CHOLHDL 2.9 12/19/2022   Lab Results  Component Value Date   VD25OH 77.1 12/19/2022   VD25OH 64.2 06/06/2022   VD25OH 63.9 04/21/2020   Lab Results  Component Value Date   WBC 7.5 11/13/2021   HGB 14.0 11/13/2021   HCT 41.3 11/13/2021   MCV 93.0 11/13/2021   PLT 243 11/13/2021   No results found for: "IRON", "TIBC", "FERRITIN"  Attestation Statements:   Reviewed by clinician on day of visit: allergies, medications, problem list, medical history, surgical history, family history, social history, and previous encounter notes.  I have reviewed the above documentation for accuracy and completeness, and I agree with the above. -  Sitlaly Gudiel d. Hadassa Cermak, NP-C

## 2023-04-06 ENCOUNTER — Other Ambulatory Visit (HOSPITAL_BASED_OUTPATIENT_CLINIC_OR_DEPARTMENT_OTHER): Payer: Self-pay

## 2023-04-06 ENCOUNTER — Other Ambulatory Visit (HOSPITAL_COMMUNITY): Payer: Self-pay

## 2023-04-08 ENCOUNTER — Other Ambulatory Visit (HOSPITAL_BASED_OUTPATIENT_CLINIC_OR_DEPARTMENT_OTHER): Payer: Self-pay

## 2023-04-09 ENCOUNTER — Other Ambulatory Visit: Payer: Self-pay | Admitting: Cardiology

## 2023-04-09 DIAGNOSIS — I2510 Atherosclerotic heart disease of native coronary artery without angina pectoris: Secondary | ICD-10-CM

## 2023-04-10 ENCOUNTER — Other Ambulatory Visit (HOSPITAL_BASED_OUTPATIENT_CLINIC_OR_DEPARTMENT_OTHER): Payer: Self-pay

## 2023-04-17 DIAGNOSIS — Z6831 Body mass index (BMI) 31.0-31.9, adult: Secondary | ICD-10-CM | POA: Diagnosis not present

## 2023-04-17 DIAGNOSIS — E669 Obesity, unspecified: Secondary | ICD-10-CM | POA: Diagnosis not present

## 2023-04-17 DIAGNOSIS — M7532 Calcific tendinitis of left shoulder: Secondary | ICD-10-CM | POA: Diagnosis not present

## 2023-04-17 DIAGNOSIS — M1120 Other chondrocalcinosis, unspecified site: Secondary | ICD-10-CM | POA: Diagnosis not present

## 2023-04-17 DIAGNOSIS — M1991 Primary osteoarthritis, unspecified site: Secondary | ICD-10-CM | POA: Diagnosis not present

## 2023-04-17 DIAGNOSIS — M7531 Calcific tendinitis of right shoulder: Secondary | ICD-10-CM | POA: Diagnosis not present

## 2023-04-21 ENCOUNTER — Other Ambulatory Visit: Payer: Self-pay | Admitting: Internal Medicine

## 2023-04-21 DIAGNOSIS — Z794 Long term (current) use of insulin: Secondary | ICD-10-CM

## 2023-04-24 ENCOUNTER — Ambulatory Visit (INDEPENDENT_AMBULATORY_CARE_PROVIDER_SITE_OTHER): Payer: Medicare PPO | Admitting: Family Medicine

## 2023-04-24 ENCOUNTER — Encounter (INDEPENDENT_AMBULATORY_CARE_PROVIDER_SITE_OTHER): Payer: Self-pay | Admitting: Family Medicine

## 2023-04-24 VITALS — BP 125/78 | HR 77 | Temp 97.8°F | Ht 63.0 in | Wt 170.0 lb

## 2023-04-24 DIAGNOSIS — E1159 Type 2 diabetes mellitus with other circulatory complications: Secondary | ICD-10-CM

## 2023-04-24 DIAGNOSIS — Z6830 Body mass index (BMI) 30.0-30.9, adult: Secondary | ICD-10-CM

## 2023-04-24 DIAGNOSIS — I152 Hypertension secondary to endocrine disorders: Secondary | ICD-10-CM

## 2023-04-24 DIAGNOSIS — Z7985 Long-term (current) use of injectable non-insulin antidiabetic drugs: Secondary | ICD-10-CM

## 2023-04-24 DIAGNOSIS — E1169 Type 2 diabetes mellitus with other specified complication: Secondary | ICD-10-CM

## 2023-04-24 NOTE — Progress Notes (Signed)
[name], D.O.  ABFM, ABOM Specializing in Clinical Bariatric Medicine  Office located at: [address] [place], [place]  [address]     Assessment and Plan:   Medications Discontinued During This Encounter  Medication Reason   insulin glargine, 2 Unit Dial, (TOUJEO MAX SOLOSTAR) 300 UNIT/ML Solostar Pen    Insulin Pen Needle (BD PEN NEEDLE NANO 2ND GEN) 32G X 4 MM MISC     No orders of the defined types were placed in this encounter.   Hypertension associated with type 2 diabetes mellitus [facility]) Assessment & Plan: BP Readings from Last 3 Encounters:  04/24/23 125/78  04/05/23 99/61  03/16/23 103/65   Her BP today is 125/78. She is on carvedilol 3.125 mg BID to manage her hypertension. Tolerating well. She endorses episodes of lightheadedness, which she notes occur intermittently about once a month. Her episodes tend to last a couple of hours. Her most recent episode was on 9/19, she woke up experiencing lightheadedness. She denies any chest pain, numbness, tingling, heart palpitations, localized neurological sx, confusion, or weakness. Her BP was 80/58 with HR of 88, symptoms resolved after lying down and taking carvedilol. She later rechecked and her BP was 75/55 with a HR of 88. In the past she has monitored both her blood pressure and blood glucose levels. Of note, she has a hx of HTN and heart diseases. She has had 2 stents placements and has been established with cardiology ever since. Her current antihypertensives and rate medications are managed by her cardiologist, who she follows up with every 6 months.   I instructed her to check her BP twice daily for 2 weeks straight, at random times. I also advised to monitor her BP every hour during episodes of lightheadedness. After 2 weeks of no symptoms, keep checking 2x daily and follow up with cardiology. Encouraged Pt to continue on all medications per her cardiologist and advised her to avoid driving when having  episodes of lightheadedness.   Type 2 diabetes mellitus with obesity [facility]) Assessment & Plan: Lab Results  Component Value Date   HGBA1C 5.5 03/14/2023   HGBA1C 5.7 (H) 12/19/2022   HGBA1C 5.5 08/30/2022    Still compliant with Mounjaro 12.5 mg weekly. Tolerating well with no adverse effects. Pt reports she stopped Tugeo per Dr. Lafe Garin. Has been off Trugeo about 1 month, and notes a small increase in her AM blood glucose readings. She reports her blood glucose readings range from 115-156 in the mornings.   Eat on a regular basis- no skipping or going long periods without eating. Reminded [name] if she feels poorly- check Blood Sugar and Blood Pressure at that time.  Importance of f/up with PCP and all other specialists, as scheduled, was stressed to the patient today I recommend to check sugars after large glucose load, 2 hours after post-prandial. Goal of BG <130.   Morbid obesity (HCC)-START BMI 40.21/DATE 03/17/20 BMI 30.0-30.9,adult-current BMI  30.12 Assessment & Plan: Ayaana Biondo is here to discuss her progress with her obesity treatment plan along with follow-up of her obesity related diagnoses. See Medical Weight Management Flowsheet for complete bioelectrical impedance results.  Since last office visit on 04/05/2023 patient's muscle mass has increased by 18.2 lbs. Fat mass has increased by 0.2 lb. Total body water has decreased by 2.8 lb.  Counseling done on how various foods will affect these numbers and how to maximize success  Total lbs lost to date: 57 lbs Total  weight loss percentage to date: -25.11%  No change to meal plan - see Subjective  Behavioral Intervention Additional resources provided today: N/a Evidence-based interventions for health behavior change were utilized today including the discussion of self monitoring techniques, problem-solving barriers and SMART goal setting techniques.   Regarding patient's less desirable eating habits and  patterns, we employed the technique of small changes.  Pt will specifically work on: Following meal plan, managing episodes of lightheadedness for next visit.    FOLLOW UP: Return in about 2 weeks (around 05/08/2023). She was informed of the importance of frequent follow up visits to maximize her success with intensive lifestyle modifications for her multiple health conditions.  Subjective:   Chief complaint: Obesity Lory is here to discuss her progress with her obesity treatment plan. She is on the the Category 2 Plan and states she is following her eating plan approximately 70% of the time. She states she is dancing 45 minutes 1 day per week and going to the gym 45 minutes 1 days per week.   Interval History:  Tikisha Molinaro is here for a follow up office visit. Since last OV,  she went on vacation in [place] and walked often. She reports episodes of lightheadedness that have made following her plan more difficult.   Pharmacotherapy for weight loss: She is currently taking  Mounjaro 12.5 mg weekly  for medical weight loss.  Denies side effects.    Review of Systems:  Pertinent positives were addressed with patient today.  Reviewed by clinician on day of visit: allergies, medications, problem list, medical history, surgical history, family history, social history, and previous encounter notes.  Weight Summary and Biometrics   Weight Lost Since Last Visit: 1lb  Weight Gained Since Last Visit: 0lb    Vitals Temp: 97.8 F (36.6 C) BP: 125/78 Pulse Rate: 77 SpO2: 98 %   Anthropometric Measurements Height: 5\' 3"  (1.6 m) Weight: 170 lb (77.1 kg) BMI (Calculated): 30.12 Weight at Last Visit: 171lb Weight Lost Since Last Visit: 1lb Weight Gained Since Last Visit: 0lb Starting Weight: 227 lb Total Weight Loss (lbs): 57 lb (25.9 kg) Peak Weight: 250 lb   Body Composition  Body Fat %: 44.2 % Fat Mass (lbs): 75.2 lbs Muscle Mass (lbs): 90 lbs Total Body Water  (lbs): 74.8 lbs Visceral Fat Rating : 13   Other Clinical Data Fasting: no Labs: no Today's Visit #: 66 Starting Date: 03/17/20    Objective:   PHYSICAL EXAM: Blood pressure 125/78, pulse 77, temperature 97.8 F (36.6 C), height 5\' 3"  (1.6 m), weight 170 lb (77.1 kg), SpO2 98%. Body mass index is 30.11 kg/m.  General: Well Developed, well nourished, and in no acute distress.  HEENT: Normocephalic, atraumatic Skin: Warm and dry, cap RF less 2 sec, good turgor Chest:  Normal excursion, shape, no gross abn Respiratory: speaking in full sentences, no conversational dyspnea NeuroM-Sk: Ambulates w/o assistance, moves * 4 Psych: A and O *3, insight good, mood-full  DIAGNOSTIC DATA REVIEWED:  BMET    Component Value Date/Time   NA 141 12/19/2022 1435   K 4.7 12/19/2022 1435   CL 105 12/19/2022 1435   CO2 21 12/19/2022 1435   GLUCOSE 92 03/14/2023 1413   BUN 35 (H) 12/19/2022 1435   CREATININE 1.11 (H) 12/19/2022 1435   CALCIUM 10.2 12/19/2022 1435   GFRNONAA 54 (L) 11/13/2021 1933   GFRAA 54 (L) 05/12/2020 1557   Lab Results  Component Value Date   HGBA1C 5.5 03/14/2023  HGBA1C 6 1 02/09/2015   No results found for: "INSULIN" Lab Results  Component Value Date   TSH 1.71 05/30/2018   CBC    Component Value Date/Time   WBC 7.5 11/13/2021 1933   RBC 4.44 11/13/2021 1933   HGB 14.0 11/13/2021 1933   HGB 13.8 01/26/2021 1427   HCT 41.3 11/13/2021 1933   HCT 41.6 01/26/2021 1427   PLT 243 11/13/2021 1933   PLT 229 01/26/2021 1427   MCV 93.0 11/13/2021 1933   MCV 92 01/26/2021 1427   MCH 31.5 11/13/2021 1933   MCHC 33.9 11/13/2021 1933   RDW 12.1 11/13/2021 1933   RDW 13.2 01/26/2021 1427   Iron Studies No results found for: "IRON", "TIBC", "FERRITIN", "IRONPCTSAT" Lipid Panel     Component Value Date/Time   CHOL 133 12/19/2022 1435   TRIG 110 12/19/2022 1435   HDL 46 12/19/2022 1435   CHOLHDL 2.9 12/19/2022 1435   LDLCALC 67 12/19/2022 1435    Hepatic Function Panel     Component Value Date/Time   PROT 6.7 12/19/2022 1435   ALBUMIN 4.4 12/19/2022 1435   AST 22 12/19/2022 1435   ALT 25 12/19/2022 1435   ALKPHOS 108 12/19/2022 1435   BILITOT 0.5 12/19/2022 1435      Component Value Date/Time   TSH 1.71 05/30/2018 0000   Nutritional Lab Results  Component Value Date   VD25OH 77.1 12/19/2022   VD25OH 64.2 06/06/2022   VD25OH 63.9 04/21/2020    Attestations:   I, Isabelle Course, acting as a Stage manager for [name], DO., have compiled all relevant documentation for today's office visit on behalf of [name], DO, while in the presence of [employer], DO.  I have reviewed the above documentation for accuracy and completeness, and I agree with the above. [name], D.O.  The 21st Century Cures Act was signed into law in 2016 which includes the topic of electronic health records.  This provides immediate access to information in MyChart.  This includes consultation notes, operative notes, office notes, lab results and pathology reports.  If you have any questions about what you read please let us know at your next visit so we can discuss your concerns and take corrective action if need be.  We are right here with you.

## 2023-04-29 LAB — LAB REPORT - SCANNED: Microalb Creat Ratio: 10

## 2023-05-05 ENCOUNTER — Encounter (INDEPENDENT_AMBULATORY_CARE_PROVIDER_SITE_OTHER): Payer: Self-pay | Admitting: Adult Health

## 2023-05-08 ENCOUNTER — Ambulatory Visit (INDEPENDENT_AMBULATORY_CARE_PROVIDER_SITE_OTHER): Payer: Medicare PPO | Admitting: Adult Health

## 2023-05-08 ENCOUNTER — Encounter (INDEPENDENT_AMBULATORY_CARE_PROVIDER_SITE_OTHER): Payer: Self-pay

## 2023-05-23 ENCOUNTER — Ambulatory Visit (INDEPENDENT_AMBULATORY_CARE_PROVIDER_SITE_OTHER): Payer: Medicare PPO | Admitting: Family Medicine

## 2023-05-23 ENCOUNTER — Encounter (INDEPENDENT_AMBULATORY_CARE_PROVIDER_SITE_OTHER): Payer: Self-pay | Admitting: Family Medicine

## 2023-05-23 VITALS — BP 110/67 | HR 78 | Temp 98.7°F | Ht 63.0 in | Wt 167.0 lb

## 2023-05-23 DIAGNOSIS — M8580 Other specified disorders of bone density and structure, unspecified site: Secondary | ICD-10-CM | POA: Diagnosis not present

## 2023-05-23 DIAGNOSIS — Z6829 Body mass index (BMI) 29.0-29.9, adult: Secondary | ICD-10-CM

## 2023-05-23 DIAGNOSIS — E1169 Type 2 diabetes mellitus with other specified complication: Secondary | ICD-10-CM | POA: Diagnosis not present

## 2023-05-23 DIAGNOSIS — I152 Hypertension secondary to endocrine disorders: Secondary | ICD-10-CM

## 2023-05-23 DIAGNOSIS — Z6830 Body mass index (BMI) 30.0-30.9, adult: Secondary | ICD-10-CM

## 2023-05-23 DIAGNOSIS — Z7985 Long-term (current) use of injectable non-insulin antidiabetic drugs: Secondary | ICD-10-CM

## 2023-05-23 DIAGNOSIS — E1159 Type 2 diabetes mellitus with other circulatory complications: Secondary | ICD-10-CM | POA: Diagnosis not present

## 2023-05-23 NOTE — Progress Notes (Signed)
[name], D.O.  ABFM, ABOM Specializing in Clinical Bariatric Medicine  Office located at: [address] [place], [place]  [address]   Assessment and Plan:   Type 2 diabetes mellitus with obesity (HCC) Assessment & Plan: T2DM treated with Mounjaro 12.5 mg once a week. Most recent hemoglobin A1c stable at 5.5 on 03/14/23. Fasting sugars since last OV: 126, 114, 116, and 175 (this is when she had GI upset after eating 8 oreos). Her 2 hour post-prandial is 127. Pt advised to maintain with Mounjaro at current dose. Reminded pt that the more she eats "off-plan", the more likely for her to have side effects of the drug.  Continue with weight loss therapy.    Hypertension associated with type 2 diabetes mellitus (HCC) Assessment & Plan: Last 3 blood pressure readings in our office are as follows: BP Readings from Last 3 Encounters:  05/23/23 110/67  04/24/23 125/78  04/05/23 99/61   Current medication regiment: Carvedilol 3.125 mg BID, Ranexa 1000 mg bid, and Lasix ( 3 times wkly). Pt reports having an episode of lightheadedness after exercising in the Cardiac Rehab facility; BP was 88/58. Her following blood pressures at home: 121/71, 104/69, 113/68, 126/78, 101/51, 117/73, 104/65, 119/72. Heart rates are in the 70s-80s on average.  Pt advised to contact specialist to further discuss episode at rehab center, her blood pressure, & med management. Continue blood pressure monitoring. Reminded patient that if they ever feel poorly in any way, to check their blood pressure and pulse as well. We will continue to monitor symptoms as they relate to her weight loss journey.   Vit D def with Osteopenia Assessment & Plan: Most recent vitamin D of 77.1 on 12/19/2022. Pt currently on OTC Cholecalciferol 2,000 units daily. Continue with weight loss efforts and current supplementation regiment. Vitamin D; future.    BMI 30.0-30.9,adult-current BMI 29.59 Morbid obesity (HCC)-START BMI  40.21/DATE 03/17/20 Assessment & Plan: Since last office visit on 04/24/23 patient's  Muscle mass has not changed. Fat mass has decreased by 2.4 lb. Total body water has decreased by 0.6 lb.  Counseling done on how various foods will affect these numbers and how to maximize success  Total lbs lost to date: 60 lbs  Total weight loss percentage to date: 26.43%   No change to meal plan - see Subjective   Behavioral Intervention Additional resources provided today: AHOY schedule Evidence-based interventions for health behavior change were utilized today including the discussion of self monitoring techniques, problem-solving barriers and SMART goal setting techniques.   Regarding patient's less desirable eating habits and patterns, we employed the technique of small changes.  Pt will specifically work on:  [employer] planning/management officer.   FOLLOW UP: Return 06/05/23. She was informed of the importance of frequent follow up visits to maximize her success with intensive lifestyle modifications for her multiple health conditions.  Subjective:   Chief complaint: Obesity Pamela Love is here to discuss her progress with her obesity treatment plan. She is on the Category 2 Plan and states she is following her eating plan approximately 70-75% of the time. She states she is dancing 45 minutes 1 day per week and cardiac rehab 60 minutes 1 day per week.   Interval History:  Pamela Love is here for a follow up office visit. Since last OV, Pamela Love has doing well. She has been following her meal plan 3/4s of the time and has no complaints regarding it. Hunger and cravings  have not been problematic. Will review her fasting blood sugars and blood pressure today.   Pharmacotherapy for weight loss: She is currently taking  Mounjaro 12.5 mg once a week  for medical weight loss.  Denies side effects.    Review of Systems:  Pertinent positives were addressed with patient today.  Reviewed  by clinician on day of visit: allergies, medications, problem list, medical history, surgical history, family history, social history, and previous encounter notes.  Weight Summary and Biometrics   Weight Lost Since Last Visit: 3lb  Weight Gained Since Last Visit: 0lb   Vitals Temp: 98.7 F (37.1 C) BP: 110/67 Pulse Rate: 78 SpO2: 99 %   Anthropometric Measurements Height: 5\' 3"  (1.6 m) Weight: 167 lb (75.8 kg) BMI (Calculated): 29.59 Weight at Last Visit: 170lb Weight Lost Since Last Visit: 3lb Weight Gained Since Last Visit: 0lb Starting Weight: 227lb Total Weight Loss (lbs): 60 lb (27.2 kg) Peak Weight: 250lb   Body Composition  Body Fat %: 43.4 % Fat Mass (lbs): 72.8 lbs Muscle Mass (lbs): 90 lbs Total Body Water (lbs): 74.2 lbs Visceral Fat Rating : 12   Other Clinical Data Fasting: no Labs: no Today's Visit #: 67 Starting Date: 03/17/20   Objective:   PHYSICAL EXAM: Blood pressure 110/67, pulse 78, temperature 98.7 F (37.1 C), height 5\' 3"  (1.6 m), weight 167 lb (75.8 kg), SpO2 99%. Body mass index is 29.58 kg/m.  General: Well Developed, well nourished, and in no acute distress.  HEENT: Normocephalic, atraumatic Skin: Warm and dry, cap RF less 2 sec, good turgor Chest:  Normal excursion, shape, no gross abn Respiratory: speaking in full sentences, no conversational dyspnea NeuroM-Sk: Ambulates w/o assistance, moves * 4 Psych: A and O *3, insight good, mood-full  DIAGNOSTIC DATA REVIEWED:  BMET    Component Value Date/Time   NA 141 12/19/2022 1435   K 4.7 12/19/2022 1435   CL 105 12/19/2022 1435   CO2 21 12/19/2022 1435   GLUCOSE 92 03/14/2023 1413   BUN 35 (H) 12/19/2022 1435   CREATININE 1.11 (H) 12/19/2022 1435   CALCIUM 10.2 12/19/2022 1435   GFRNONAA 54 (L) 11/13/2021 1933   GFRAA 54 (L) 05/12/2020 1557   Lab Results  Component Value Date   HGBA1C 5.5 03/14/2023   HGBA1C 6 1 02/09/2015   No results found for: "INSULIN" Lab  Results  Component Value Date   TSH 1.71 05/30/2018   CBC    Component Value Date/Time   WBC 7.5 11/13/2021 1933   RBC 4.44 11/13/2021 1933   HGB 14.0 11/13/2021 1933   HGB 13.8 01/26/2021 1427   HCT 41.3 11/13/2021 1933   HCT 41.6 01/26/2021 1427   PLT 243 11/13/2021 1933   PLT 229 01/26/2021 1427   MCV 93.0 11/13/2021 1933   MCV 92 01/26/2021 1427   MCH 31.5 11/13/2021 1933   MCHC 33.9 11/13/2021 1933   RDW 12.1 11/13/2021 1933   RDW 13.2 01/26/2021 1427   Iron Studies No results found for: "IRON", "TIBC", "FERRITIN", "IRONPCTSAT" Lipid Panel     Component Value Date/Time   CHOL 133 12/19/2022 1435   TRIG 110 12/19/2022 1435   HDL 46 12/19/2022 1435   CHOLHDL 2.9 12/19/2022 1435   LDLCALC 67 12/19/2022 1435   Hepatic Function Panel     Component Value Date/Time   PROT 6.7 12/19/2022 1435   ALBUMIN 4.4 12/19/2022 1435   AST 22 12/19/2022 1435   ALT 25 12/19/2022 1435   ALKPHOS 108  12/19/2022 1435   BILITOT 0.5 12/19/2022 1435      Component Value Date/Time   TSH 1.71 05/30/2018 0000   Nutritional Lab Results  Component Value Date   VD25OH 77.1 12/19/2022   VD25OH 64.2 06/06/2022   VD25OH 63.9 04/21/2020    Attestations:   I, Special Puri, acting as a Stage manager for [employer], DO., have compiled all relevant documentation for today's office visit on behalf of [name], DO, while in the presence of [employer], DO.  I have reviewed the above documentation for accuracy and completeness, and I agree with the above. [name], D.O.  The 21st Century Cures Act was signed into law in 2016 which includes the topic of electronic health records.  This provides immediate access to information in MyChart.  This includes consultation notes, operative notes, office notes, lab results and pathology reports.  If you have any questions about what you read please let us know at your next visit so we can discuss your concerns and take corrective  action if need be.  We are right here with you.

## 2023-05-24 ENCOUNTER — Encounter (HOSPITAL_BASED_OUTPATIENT_CLINIC_OR_DEPARTMENT_OTHER): Payer: Self-pay

## 2023-05-25 NOTE — Telephone Encounter (Signed)
I would have her stop the lasix first and monitor for swelling, taking only as needed. Her only other BP affecting med is carvedilol, and this is a low dose. Please have her keep checking blood pressures and send [place] results in a week or two. And please ask her to stay hydrated.

## 2023-05-25 NOTE — Telephone Encounter (Signed)
Patient was 178 at last office visit, has lost weight and now symptomatic with low BP, please adjust meds!

## 2023-05-29 DIAGNOSIS — G4733 Obstructive sleep apnea (adult) (pediatric): Secondary | ICD-10-CM | POA: Diagnosis not present

## 2023-06-05 ENCOUNTER — Encounter (INDEPENDENT_AMBULATORY_CARE_PROVIDER_SITE_OTHER): Payer: Self-pay | Admitting: Family Medicine

## 2023-06-05 ENCOUNTER — Ambulatory Visit (INDEPENDENT_AMBULATORY_CARE_PROVIDER_SITE_OTHER): Payer: Medicare PPO | Admitting: Family Medicine

## 2023-06-05 VITALS — BP 124/78 | HR 78 | Temp 98.6°F | Ht 63.0 in | Wt 167.0 lb

## 2023-06-05 DIAGNOSIS — J324 Chronic pansinusitis: Secondary | ICD-10-CM

## 2023-06-05 DIAGNOSIS — I152 Hypertension secondary to endocrine disorders: Secondary | ICD-10-CM

## 2023-06-05 DIAGNOSIS — E1169 Type 2 diabetes mellitus with other specified complication: Secondary | ICD-10-CM

## 2023-06-05 DIAGNOSIS — E1159 Type 2 diabetes mellitus with other circulatory complications: Secondary | ICD-10-CM

## 2023-06-05 DIAGNOSIS — Z6830 Body mass index (BMI) 30.0-30.9, adult: Secondary | ICD-10-CM

## 2023-06-05 DIAGNOSIS — Z6829 Body mass index (BMI) 29.0-29.9, adult: Secondary | ICD-10-CM

## 2023-06-05 DIAGNOSIS — E669 Obesity, unspecified: Secondary | ICD-10-CM

## 2023-06-05 DIAGNOSIS — Z7985 Long-term (current) use of injectable non-insulin antidiabetic drugs: Secondary | ICD-10-CM

## 2023-06-05 MED ORDER — LEVOCETIRIZINE DIHYDROCHLORIDE 5 MG PO TABS
5.0000 mg | ORAL_TABLET | Freq: Every evening | ORAL | 0 refills | Status: AC
Start: 2023-06-05 — End: ?

## 2023-06-05 MED ORDER — MONTELUKAST SODIUM 10 MG PO TABS: 10.0000 mg | ORAL_TABLET | Freq: Every day | ORAL | 0 refills | Status: DC

## 2023-06-05 NOTE — Progress Notes (Signed)
Pamela Love, D.O.  ABFM, ABOM Specializing in Clinical Bariatric Medicine  Office located at: [address] [place], [place]  [address]     Assessment and Plan:  Draw Vit d, A1c, and CMP next OV. Meds ordered this encounter  Medications   levocetirizine (XYZAL) 5 MG tablet    Sig: Take 1 tablet (5 mg total) by mouth every evening.    Dispense:  30 tablet    Refill:  0   montelukast (SINGULAIR) 10 MG tablet    Sig: Take 1 tablet (10 mg total) by mouth at bedtime.    Dispense:  30 tablet    Refill:  0    allergic and environmental sinusitis Assessment: Condition is Worsening.. Pt informed me that her allergies are continuing to get worse with the changing seasons. She has taken medication to no resolve unless very temporary.   Plan: - I will prescribe Singulair 10mg  and Xyzal 5mg  to take once daily.    Type 2 diabetes mellitus with obesity (HCC) Assessment: Condition is Controlled.. Pt informed me that she has been eating sweets recently. She monitors her blood sugar at home and brought in a blood sugar log today. This averages from 128, 146, 99, and 100. This is being treated with [facility] and she tolerates this well. She denies any adverse side effects on this when following the meal plan. She endorses having good control of her hunger and cravings for the time being.  Lab Results  Component Value Date   HGBA1C 5.5 03/14/2023   HGBA1C 5.7 (H) 12/19/2022   HGBA1C 5.5 08/30/2022    Plan:- Continue with Mounjaro 12.5 once weekly. She denies need for refill.   - Pamela Love will continue to work on weight loss, exercise, via their meal plan we devised to help decrease her A1c and insulin levels.   - Reminded Pamela Love if she feels poorly- check Blood Sugar and Blood Pressure at that time.     - Recheck labs in 3 months if not done at Endo provider / PCP.    Hypertension associated with type 2 diabetes mellitus (HCC) Assessment: Condition is Controlled.. This  is being treated with Carvedilol 3.125 mg BID, Ranexa 1000 mg bid for chest pain, and Lasix 40mg  three times weekly. However, she was recently switched to taking Lasix three times weekly or prn if needed more. When she is off lasix her BP averages 137/78 and 109/55. Pt does monitor BP at home.  Last 3 blood pressure readings in our office are as follows: BP Readings from Last 3 Encounters:  06/05/23 124/78  05/23/23 110/67  04/24/23 125/78    Plan: - Continue with Carvedilol, Ranexa, and Lasix at its current dose as directed.   - Pamela Love BP is 124/78 at goal today.   - We will continue to monitor closely alongside PCP/ specialists and pt was also encouraged to also f/up with those individuals as instructed by them.   - Continue to regularly monitor BP preferably ambulatory monitoring.    TREATMENT PLAN FOR OBESITY: BMI 30.0-30.9,adult-current BMI 29.59 Morbid obesity (HCC)-START BMI 40.21/DATE 03/17/20 Assessment:  Pamela Love is here to discuss her progress with her obesity treatment plan along with follow-up of her obesity related diagnoses. See Medical Weight Management Flowsheet for complete bioelectrical impedance results.  Condition is not optimized. Biometric data collected today, was reviewed with patient.   Since last office visit on 05/23/2023 patient's  Muscle mass has decreased by 1.2lb. Fat  mass has increased by 1.4lb. Total body water has increased by 1.2lb.  Counseling done on how various foods will affect these numbers and how to maximize success  Total lbs lost to date: 60 Total weight loss percentage to date: 26.43%  Plan: - Pamela Love will work on healthier eating habits and try to following the Category 2 meal plan.   Behavioral Intervention Additional resources provided today: Recipes Evidence-based interventions for health behavior change were utilized today including the discussion of self monitoring techniques, problem-solving barriers and  SMART goal setting techniques.   Regarding patient's less desirable eating habits and patterns, we employed the technique of small changes.  Pt will specifically work on: Increase water intake for next visit.     She has agreed to Continue current level of physical activity    FOLLOW UP: Return in about 2 weeks (around 06/19/2023).  She was informed of the importance of frequent follow up visits to maximize her success with intensive lifestyle modifications for her multiple health conditions.  Subjective:   Chief complaint: Obesity Pamela Love is here to discuss her progress with her obesity treatment plan. She is on the the Category 2 Plan and states she is following her eating plan approximately 60% of the time. She states she is doing cardio and dancing 60 minutes 2 days per week.  Interval History:  Pamela Love is here for a follow up office visit.     Since last office visit:  Pt has been well since last OV. Pt endorses trying to follow the meal plan concisely but did have some days where she have some days where she ate off plan. She continues to find ways to exercise and stay active.    We reviewed her meal plan and all questions were answered.   Pharmacotherapy for weight loss: She is currently taking  mounjaro  for medical weight loss.  Denies side effects.    Review of Systems:  Pertinent positives were addressed with patient today.  Reviewed by clinician on day of visit: allergies, medications, problem list, medical history, surgical history, family history, social history, and previous encounter notes.  Weight Summary and Biometrics   Weight Lost Since Last Visit: 0lb  Weight Gained Since Last Visit: 0lb   Vitals Temp: 98.6 F (37 C) BP: 124/78 Pulse Rate: 78 SpO2: 99 %   Anthropometric Measurements Height: 5\' 3"  (1.6 m) Weight: 167 lb (75.8 kg) BMI (Calculated): 29.59 Weight at Last Visit: 167lb Weight Lost Since Last Visit: 0lb Weight Gained Since  Last Visit: 0lb Starting Weight: 227lb Total Weight Loss (lbs): 60 lb (27.2 kg) Peak Weight: 250lb   Body Composition  Body Fat %: 44.2 % Fat Mass (lbs): 74.2 lbs Muscle Mass (lbs): 88.8 lbs Total Body Water (lbs): 75.4 lbs Visceral Fat Rating : 13   Other Clinical Data Fasting: no Labs: no Today's Visit #: 68 Starting Date: 03/17/20     Objective:   PHYSICAL EXAM: Blood pressure 124/78, pulse 78, temperature 98.6 F (37 C), height 5\' 3"  (1.6 m), weight 167 lb (75.8 kg), SpO2 99%. Body mass index is 29.58 kg/m.  General: Well Developed, well nourished, and in no acute distress.  HEENT: Normocephalic, atraumatic Skin: Warm and dry, cap RF less 2 sec, good turgor Chest:  Normal excursion, shape, no gross abn Respiratory: speaking in full sentences, no conversational dyspnea NeuroM-Sk: Ambulates w/o assistance, moves * 4 Psych: A and O *3, insight good, mood-full  DIAGNOSTIC DATA REVIEWED:  BMET  Component Value Date/Time   NA 141 12/19/2022 1435   K 4.7 12/19/2022 1435   CL 105 12/19/2022 1435   CO2 21 12/19/2022 1435   GLUCOSE 92 03/14/2023 1413   BUN 35 (H) 12/19/2022 1435   CREATININE 1.11 (H) 12/19/2022 1435   CALCIUM 10.2 12/19/2022 1435   GFRNONAA 54 (L) 11/13/2021 1933   GFRAA 54 (L) 05/12/2020 1557   Lab Results  Component Value Date   HGBA1C 5.5 03/14/2023   HGBA1C 6 1 02/09/2015   No results found for: "INSULIN" Lab Results  Component Value Date   TSH 1.71 05/30/2018   CBC    Component Value Date/Time   WBC 7.5 11/13/2021 1933   RBC 4.44 11/13/2021 1933   HGB 14.0 11/13/2021 1933   HGB 13.8 01/26/2021 1427   HCT 41.3 11/13/2021 1933   HCT 41.6 01/26/2021 1427   PLT 243 11/13/2021 1933   PLT 229 01/26/2021 1427   MCV 93.0 11/13/2021 1933   MCV 92 01/26/2021 1427   MCH 31.5 11/13/2021 1933   MCHC 33.9 11/13/2021 1933   RDW 12.1 11/13/2021 1933   RDW 13.2 01/26/2021 1427   Iron Studies No results found for: "IRON", "TIBC",  "FERRITIN", "IRONPCTSAT" Lipid Panel     Component Value Date/Time   CHOL 133 12/19/2022 1435   TRIG 110 12/19/2022 1435   HDL 46 12/19/2022 1435   CHOLHDL 2.9 12/19/2022 1435   LDLCALC 67 12/19/2022 1435   Hepatic Function Panel     Component Value Date/Time   PROT 6.7 12/19/2022 1435   ALBUMIN 4.4 12/19/2022 1435   AST 22 12/19/2022 1435   ALT 25 12/19/2022 1435   ALKPHOS 108 12/19/2022 1435   BILITOT 0.5 12/19/2022 1435      Component Value Date/Time   TSH 1.71 05/30/2018 0000   Nutritional Lab Results  Component Value Date   VD25OH 77.1 12/19/2022   VD25OH 64.2 06/06/2022   VD25OH 63.9 04/21/2020    Attestations:   I, Clinical biochemist, acting as a Stage manager for [employer], DO., have compiled all relevant documentation for today's office visit on behalf of Pamela Lot, DO, while in the presence of [employer], DO.  I have reviewed the above documentation for accuracy and completeness, and I agree with the above. Pamela Love, D.O.  The 21st Century Cures Act was signed into law in 2016 which includes the topic of electronic health records.  This provides immediate access to information in MyChart.  This includes consultation notes, operative notes, office notes, lab results and pathology reports.  If you have any questions about what you read please let us know at your next visit so we can discuss your concerns and take corrective action if need be.  We are right here with you.

## 2023-06-11 ENCOUNTER — Encounter (HOSPITAL_BASED_OUTPATIENT_CLINIC_OR_DEPARTMENT_OTHER): Payer: Self-pay

## 2023-06-12 ENCOUNTER — Telehealth (HOSPITAL_BASED_OUTPATIENT_CLINIC_OR_DEPARTMENT_OTHER): Payer: Self-pay | Admitting: Cardiology

## 2023-06-12 NOTE — Telephone Encounter (Signed)
Caller Debarah Crape) stated patient will need a prescription for repair to her CPAP machine sent to [facility] at fax# [phone].  Phone# [phone], option 1.  Caller stated can call patient directly to follow-up for further information.

## 2023-06-15 ENCOUNTER — Other Ambulatory Visit: Payer: Self-pay

## 2023-06-15 DIAGNOSIS — G4733 Obstructive sleep apnea (adult) (pediatric): Secondary | ICD-10-CM

## 2023-06-15 NOTE — Telephone Encounter (Signed)
Order for new machine has been faxed to [facility] supply

## 2023-06-19 ENCOUNTER — Encounter (HOSPITAL_BASED_OUTPATIENT_CLINIC_OR_DEPARTMENT_OTHER): Payer: Self-pay

## 2023-06-19 ENCOUNTER — Other Ambulatory Visit (HOSPITAL_BASED_OUTPATIENT_CLINIC_OR_DEPARTMENT_OTHER): Payer: Self-pay

## 2023-06-19 ENCOUNTER — Ambulatory Visit (INDEPENDENT_AMBULATORY_CARE_PROVIDER_SITE_OTHER): Payer: Medicare PPO | Admitting: Adult Health

## 2023-06-19 ENCOUNTER — Encounter (INDEPENDENT_AMBULATORY_CARE_PROVIDER_SITE_OTHER): Payer: Self-pay | Admitting: Adult Health

## 2023-06-19 VITALS — BP 130/75 | HR 72 | Temp 98.2°F | Ht 63.0 in | Wt 168.0 lb

## 2023-06-19 DIAGNOSIS — E669 Obesity, unspecified: Secondary | ICD-10-CM | POA: Diagnosis not present

## 2023-06-19 DIAGNOSIS — E559 Vitamin D deficiency, unspecified: Secondary | ICD-10-CM

## 2023-06-19 DIAGNOSIS — E1169 Type 2 diabetes mellitus with other specified complication: Secondary | ICD-10-CM | POA: Diagnosis not present

## 2023-06-19 DIAGNOSIS — Z0000 Encounter for general adult medical examination without abnormal findings: Secondary | ICD-10-CM

## 2023-06-19 DIAGNOSIS — Z6829 Body mass index (BMI) 29.0-29.9, adult: Secondary | ICD-10-CM | POA: Diagnosis not present

## 2023-06-19 DIAGNOSIS — M8580 Other specified disorders of bone density and structure, unspecified site: Secondary | ICD-10-CM

## 2023-06-19 DIAGNOSIS — E876 Hypokalemia: Secondary | ICD-10-CM

## 2023-06-19 DIAGNOSIS — Z7985 Long-term (current) use of injectable non-insulin antidiabetic drugs: Secondary | ICD-10-CM

## 2023-06-19 DIAGNOSIS — Z6832 Body mass index (BMI) 32.0-32.9, adult: Secondary | ICD-10-CM

## 2023-06-19 MED ORDER — TIRZEPATIDE 12.5 MG/0.5ML ~~LOC~~ SOAJ
12.5000 mg | SUBCUTANEOUS | 0 refills | Status: DC
  Filled 2023-06-19 – 2023-07-01 (×2): qty 6, 84d supply, fill #0

## 2023-06-19 NOTE — Telephone Encounter (Signed)
Secure chat sent to [facility] provider.   Alver Sorrow, NP

## 2023-06-19 NOTE — Progress Notes (Signed)
WEIGHT SUMMARY AND BIOMETRICS  Vitals Temp: 98.2 F (36.8 C) BP: 130/75 Pulse Rate: 72 SpO2: 97 %   Anthropometric Measurements Height: 5\' 3"  (1.6 m) Weight: 168 lb (76.2 kg) BMI (Calculated): 29.77 Weight at Last Visit: 167lb Weight Lost Since Last Visit: 0 Weight Gained Since Last Visit: 1lb Starting Weight: 227lb Total Weight Loss (lbs): 59 lb (26.8 kg) Peak Weight: 250lb   Body Composition  Body Fat %: 44.5 % Fat Mass (lbs): 74.8 lbs Muscle Mass (lbs): 88.6 lbs Visceral Fat Rating : 13   Other Clinical Data Fasting: no Labs: no Today's Visit #: 77 Starting Date: 03/17/20    Chief Complaint:   OBESITY Pamela Love is here to discuss her progress with her obesity treatment plan. She is on the the Category 2 Plan and states she is following her eating plan approximately 65 % of the time. She states she is exercising Walking/NEAT activities.   Interim History:  03/18/2023-Dr.Gherghe/Endocrinology instructed her to stop daily Toujeo 6 U injections- SHE IS THRILLED!   She has lost around 60 lbs! Current BMI 29.77  Due to symptomatic hypotension- she stopped her Lasix on/about 05/25/2023  Subjective:   1. Healthcare maintenance Due to symptomatic hypotension- she stopped her Lasix on/about 05/25/2023 She also stopped oral K+ 20 mEq daily. Cards requested BMET today- pt agreeable to draw at [facility]  She reports increase in bilateral lower extremity edema since stopping the diuretic. She denies dyspnea  2. Type 2 diabetes mellitus with obesity (HCC) Lab Results  Component Value Date   HGBA1C 5.5 03/14/2023   HGBA1C 5.7 (H) 12/19/2022   HGBA1C 5.5 08/30/2022   She has not been checking home CBG She denies sx's of hypoglycemia Only antidiabetic therapy- weekly Mounjaro 12.5mg   Denies mass in neck, dysphagia, dyspepsia, persistent hoarseness, abdominal pain, or N/V/C   3. Vit D Def She endorses stable energy levels. She is on daily OTC Vit D 2 1,000  international units - 2 caps per day= 2,000 international units   Assessment/Plan:   1. Healthcare maintenance Check Labs - Basic Metabolic Panel (BMET) Forward results to Cards  Recommend daily compression socks and elevating legs when able  2. Type 2 diabetes mellitus with obesity (HCC) Refill - tirzepatide (MOUNJARO) 12.5 MG/0.5ML Pen; Inject 12.5 mg into the skin once a week on Thursday.  Dispense: 6 mL; Refill: 0 Check Labs -A1c  3. Vit D Def Check Labs  3. BMI 32.0-32.9,adult-CURRENT BMI 29.77  [name] is currently in the action stage of change. As such, her goal is to continue with weight loss efforts. She has agreed to the Category 2 Plan.   Exercise goals: Older adults should follow the adult guidelines. When older adults cannot meet the adult guidelines, they should be as physically active as their abilities and conditions will allow.  Older adults should do exercises that maintain or improve balance if they are at risk of falling.  Older adults should determine their level of effort for physical activity relative to their level of fitness.  Older adults with chronic conditions should understand whether and how their conditions affect their ability to do regular physical activity safely.  Behavioral modification strategies: increasing lean protein intake, decreasing simple carbohydrates, increasing vegetables, increasing water intake, no skipping meals, meal planning and cooking strategies, keeping healthy foods in the home, ways to avoid boredom eating, and planning for success.  Kikue has agreed to follow-up with our clinic in 2 weeks. She was informed of the importance  of frequent follow-up visits to maximize her success with intensive lifestyle modifications for her multiple health conditions.   Reghan was informed we would discuss her lab results at her next visit unless there is a critical issue that needs to be addressed sooner. Birdine agreed to keep her next visit at the  agreed upon time to discuss these results.  Objective:   Blood pressure 130/75, pulse 72, temperature 98.2 F (36.8 C), height 5\' 3"  (1.6 m), weight 168 lb (76.2 kg), SpO2 97%. Body mass index is 29.76 kg/m.  General: Cooperative, alert, well developed, in no acute distress. HEENT: Conjunctivae and lids unremarkable. Cardiovascular: Regular rhythm.  Lungs: Normal work of breathing. Neurologic: No focal deficits.   Lab Results  Component Value Date   CREATININE 1.11 (H) 12/19/2022   BUN 35 (H) 12/19/2022   NA 141 12/19/2022   K 4.7 12/19/2022   CL 105 12/19/2022   CO2 21 12/19/2022   Lab Results  Component Value Date   ALT 25 12/19/2022   AST 22 12/19/2022   ALKPHOS 108 12/19/2022   BILITOT 0.5 12/19/2022   Lab Results  Component Value Date   HGBA1C 5.5 03/14/2023   HGBA1C 5.7 (H) 12/19/2022   HGBA1C 5.5 08/30/2022   HGBA1C 6.0 (H) 06/06/2022   HGBA1C 6.1 (A) 02/25/2022   No results found for: "INSULIN" Lab Results  Component Value Date   TSH 1.71 05/30/2018   Lab Results  Component Value Date   CHOL 133 12/19/2022   HDL 46 12/19/2022   LDLCALC 67 12/19/2022   TRIG 110 12/19/2022   CHOLHDL 2.9 12/19/2022   Lab Results  Component Value Date   VD25OH 77.1 12/19/2022   VD25OH 64.2 06/06/2022   VD25OH 63.9 04/21/2020   Lab Results  Component Value Date   WBC 7.5 11/13/2021   HGB 14.0 11/13/2021   HCT 41.3 11/13/2021   MCV 93.0 11/13/2021   PLT 243 11/13/2021   No results found for: "IRON", "TIBC", "FERRITIN"  Attestation Statements:   Reviewed by clinician on day of visit: allergies, medications, problem list, medical history, surgical history, family history, social history, and previous encounter notes.  I have reviewed the above documentation for accuracy and completeness, and I agree with the above. -  Shevy Yaney d. Nekeya Briski, NP-C

## 2023-06-20 LAB — BASIC METABOLIC PANEL
BUN/Creatinine Ratio: 41 — ABNORMAL HIGH (ref 12–28)
BUN: 37 mg/dL — ABNORMAL HIGH (ref 8–27)
CO2: 20 mmol/L (ref 20–29)
Calcium: 9.6 mg/dL (ref 8.7–10.3)
Chloride: 104 mmol/L (ref 96–106)
Creatinine, Ser: 0.9 mg/dL (ref 0.57–1.00)
Glucose: 86 mg/dL (ref 70–99)
Potassium: 4.4 mmol/L (ref 3.5–5.2)
Sodium: 141 mmol/L (ref 134–144)
eGFR: 67 mL/min/{1.73_m2} (ref >59)

## 2023-06-20 LAB — VITAMIN D 25 HYDROXY (VIT D DEFICIENCY, FRACTURES): Vit D, 25-Hydroxy: 61.9 ng/mL (ref 30.0–100.0)

## 2023-06-20 LAB — HEMOGLOBIN A1C
Est. average glucose Bld gHb Est-mCnc: 111 mg/dL
Hgb A1c MFr Bld: 5.5 % (ref 4.8–5.6)

## 2023-06-23 IMAGING — MG MM DIGITAL SCREENING BILAT W/ TOMO AND CAD
6 of 12 series · 6 of 36 positions shown · non-contrast
Comparison: Previous exam(s).

CLINICAL DATA: Screening.

EXAM:
DIGITAL SCREENING BILATERAL MAMMOGRAM WITH TOMOSYNTHESIS AND CAD
TECHNIQUE: Bilateral screening digital craniocaudal and mediolateral oblique
mammograms were obtained. Bilateral screening digital breast
tomosynthesis was performed. The images were evaluated with
computer-aided detection.

[R CC synth-2D (1 of 2)]
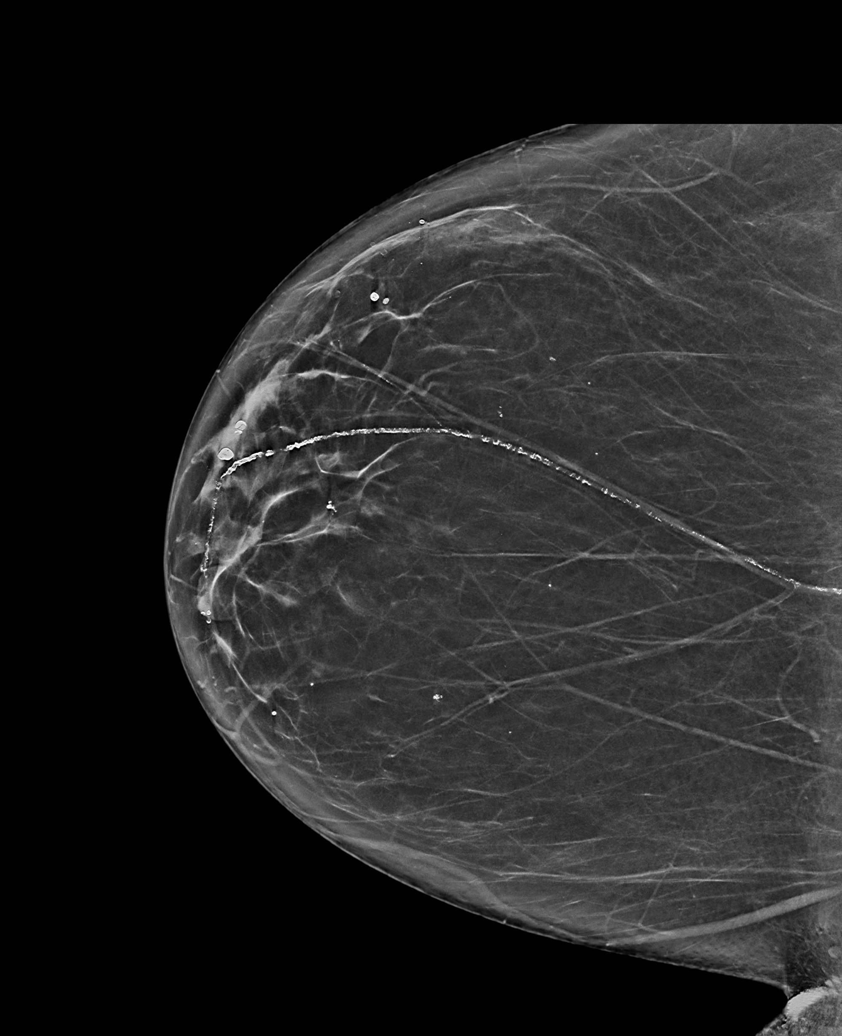

[R MLO synth-2D]
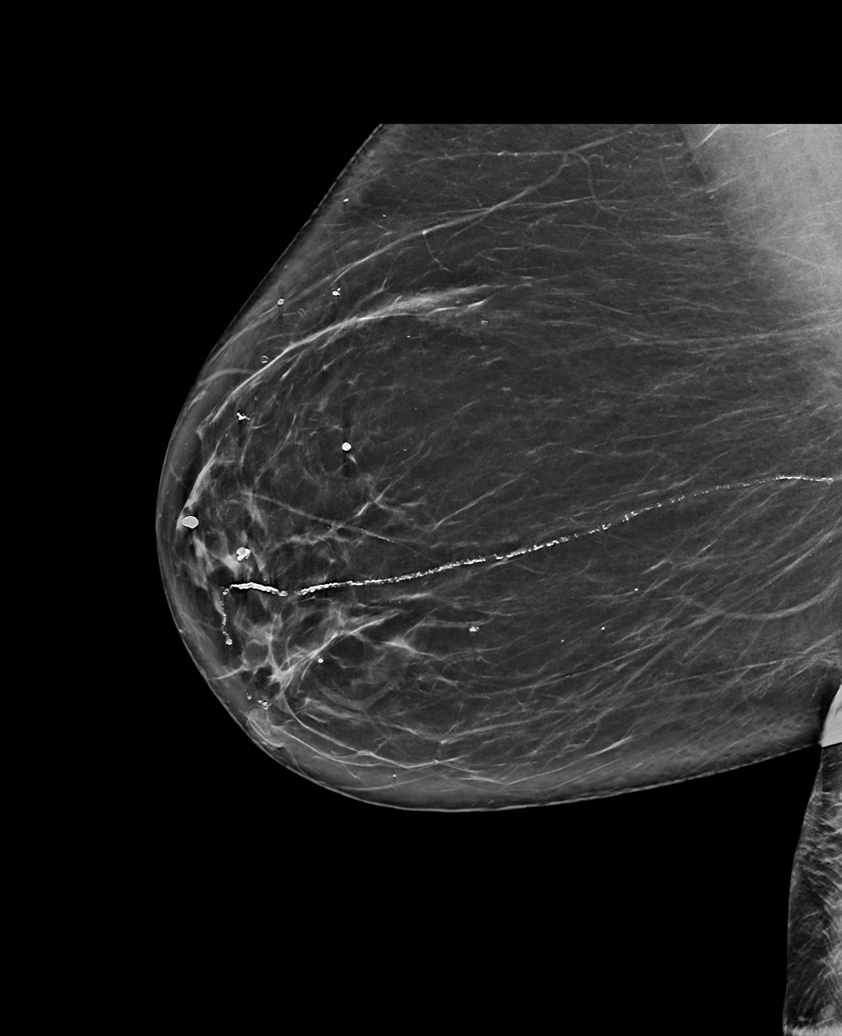

[L CC synth-2D (1 of 2)]
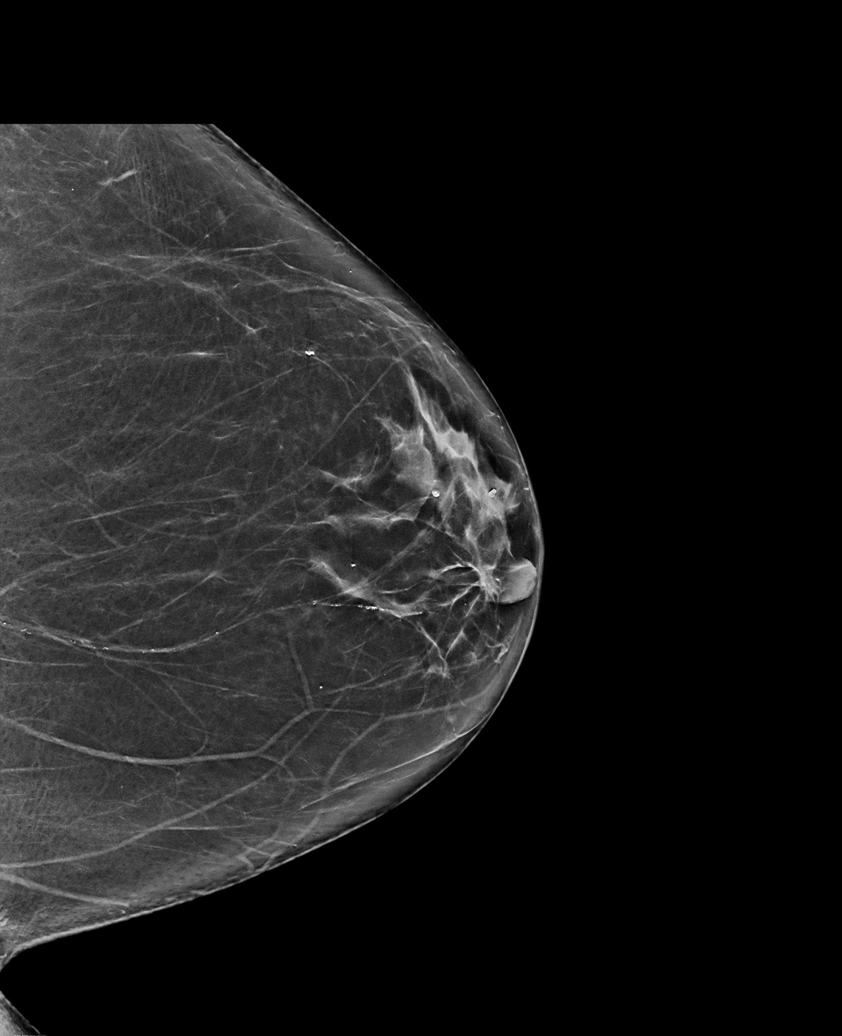

[R CC synth-2D (2 of 2)]
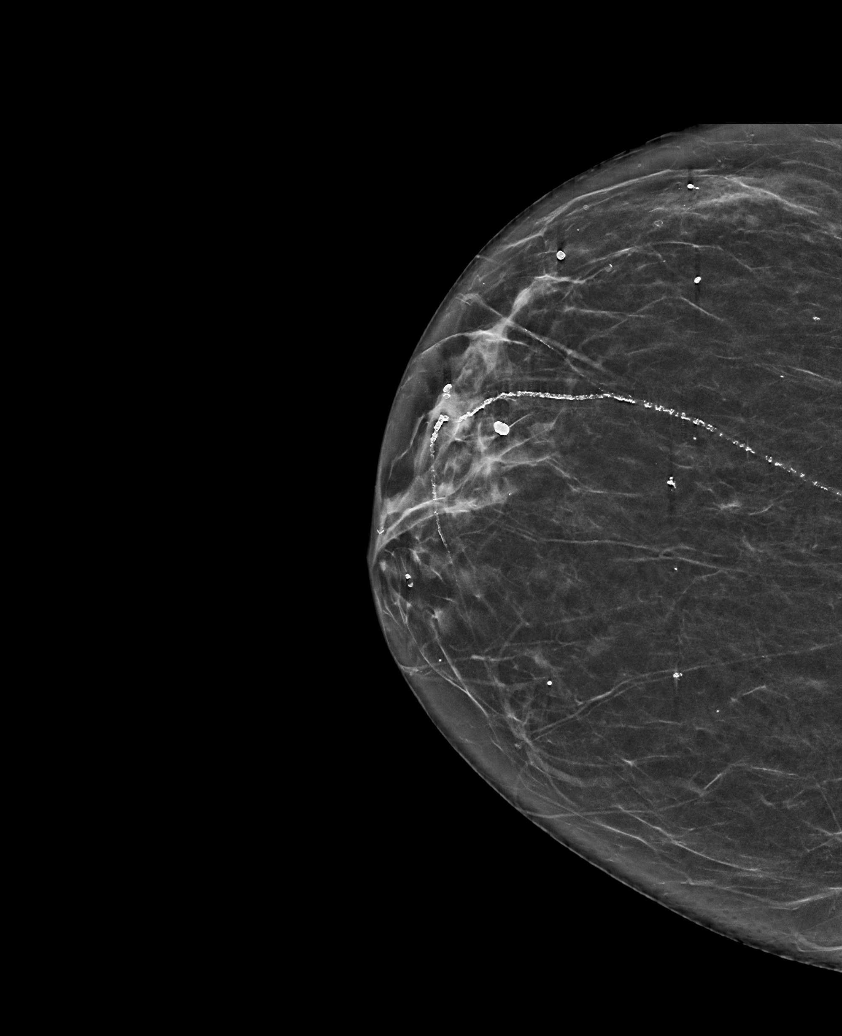

[L MLO synth-2D]
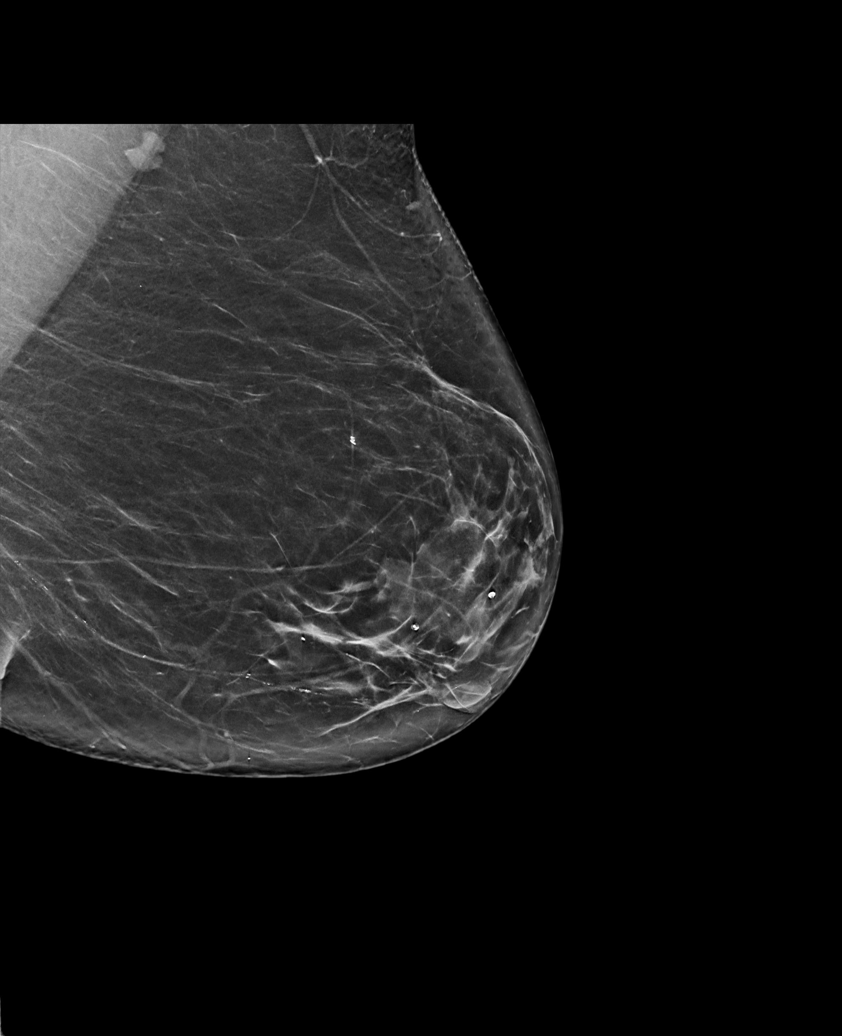

[L CC synth-2D (2 of 2)]
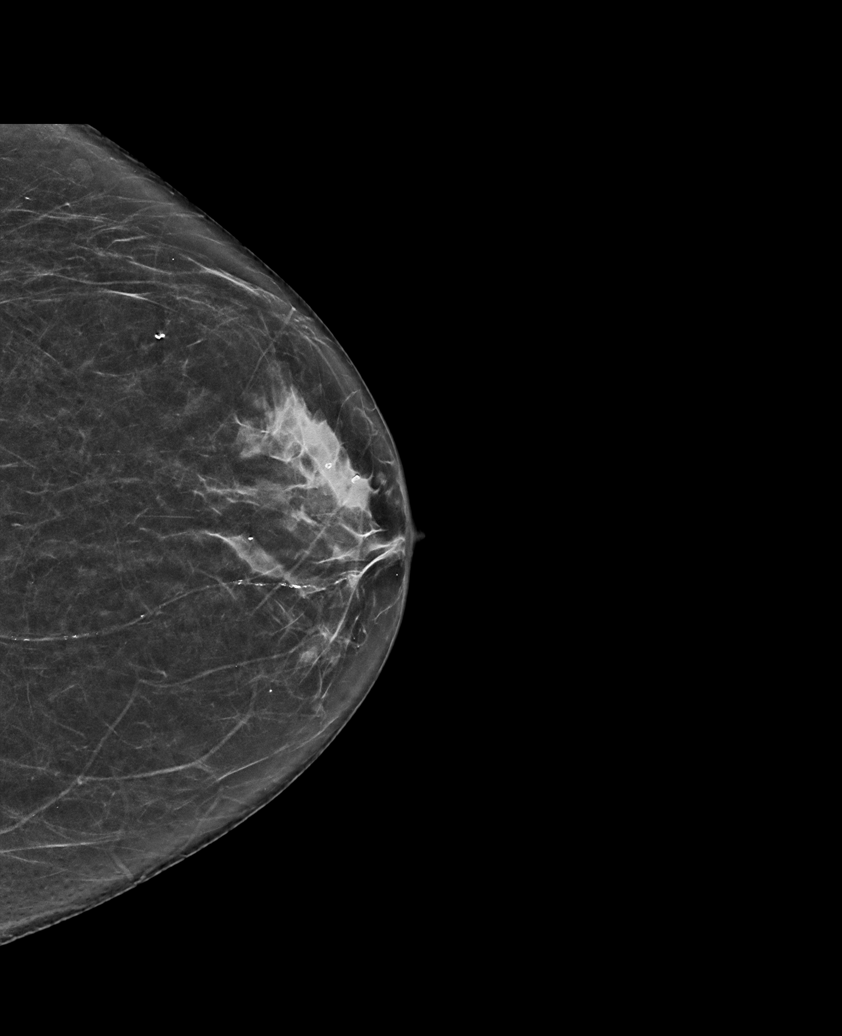

[6 of 36 positions shown; findings below may reference images not displayed]

ACR Breast Density Category b: There are scattered areas of
fibroglandular density.
FINDINGS: There are no findings suspicious for malignancy.
IMPRESSION: No mammographic evidence of malignancy. A result letter of this
screening mammogram will be mailed directly to the patient.

RECOMMENDATION:
Screening mammogram in one year. (Code:51-O-LD2)

BI-RADS CATEGORY  1: Negative.

## 2023-06-26 NOTE — Telephone Encounter (Signed)
  Pt is calling to follow up, she said, [employer] did not receive order. She is requesting if it can refax today fax# [phone] ATTN: Linus Orn. She is requesting if this can be done today since her cpap broke down this weekend.

## 2023-06-27 ENCOUNTER — Other Ambulatory Visit (INDEPENDENT_AMBULATORY_CARE_PROVIDER_SITE_OTHER): Payer: Self-pay | Admitting: Family Medicine

## 2023-06-27 DIAGNOSIS — J324 Chronic pansinusitis: Secondary | ICD-10-CM

## 2023-06-30 ENCOUNTER — Other Ambulatory Visit (HOSPITAL_BASED_OUTPATIENT_CLINIC_OR_DEPARTMENT_OTHER): Payer: Self-pay | Admitting: Cardiology

## 2023-06-30 DIAGNOSIS — I2510 Atherosclerotic heart disease of native coronary artery without angina pectoris: Secondary | ICD-10-CM

## 2023-07-03 ENCOUNTER — Other Ambulatory Visit (HOSPITAL_COMMUNITY): Payer: Self-pay

## 2023-07-03 ENCOUNTER — Ambulatory Visit (INDEPENDENT_AMBULATORY_CARE_PROVIDER_SITE_OTHER): Payer: Medicare PPO | Admitting: Family Medicine

## 2023-07-03 ENCOUNTER — Encounter (INDEPENDENT_AMBULATORY_CARE_PROVIDER_SITE_OTHER): Payer: Self-pay | Admitting: Family Medicine

## 2023-07-03 ENCOUNTER — Other Ambulatory Visit (HOSPITAL_BASED_OUTPATIENT_CLINIC_OR_DEPARTMENT_OTHER): Payer: Self-pay

## 2023-07-03 VITALS — BP 137/81 | HR 72 | Temp 98.1°F | Ht 63.0 in | Wt 168.0 lb

## 2023-07-03 DIAGNOSIS — E1159 Type 2 diabetes mellitus with other circulatory complications: Secondary | ICD-10-CM | POA: Diagnosis not present

## 2023-07-03 DIAGNOSIS — Z6832 Body mass index (BMI) 32.0-32.9, adult: Secondary | ICD-10-CM

## 2023-07-03 DIAGNOSIS — E1169 Type 2 diabetes mellitus with other specified complication: Secondary | ICD-10-CM

## 2023-07-03 DIAGNOSIS — Z6829 Body mass index (BMI) 29.0-29.9, adult: Secondary | ICD-10-CM

## 2023-07-03 DIAGNOSIS — I152 Hypertension secondary to endocrine disorders: Secondary | ICD-10-CM | POA: Diagnosis not present

## 2023-07-03 DIAGNOSIS — E559 Vitamin D deficiency, unspecified: Secondary | ICD-10-CM

## 2023-07-03 DIAGNOSIS — Z7985 Long-term (current) use of injectable non-insulin antidiabetic drugs: Secondary | ICD-10-CM | POA: Diagnosis not present

## 2023-07-03 DIAGNOSIS — E669 Obesity, unspecified: Secondary | ICD-10-CM

## 2023-07-03 NOTE — Progress Notes (Signed)
Pamela Love, D.O.  ABFM, ABOM Specializing in Clinical Bariatric Medicine  Office located at: [address] [name], [place]  [address]   Assessment and Plan:   FOR THE DISEASE OF OBESITY: BMI 32.0-32.9,adult-CURRENT BMI 29.77 Morbid obesity (HCC)-START BMI 40.21/DATE 03/17/20 Since last office visit on 06/19/2023 patient's  Muscle mass has increased by 0.8lb. Fat mass has decreased by 0.8lb. Counseling done on how various foods will affect these numbers and how to maximize success  Total lbs lost to date: 59 Total weight loss percentage to date: 25.99%    Recommended Dietary Goals Pamela Love is currently in the action stage of change. As such, her goal is to continue weight management plan.  She has agreed to: continue current plan   Behavioral Intervention We discussed the following today: increasing water intake , continue to work on implementation of reduced calorie nutritional plan, continue to practice mindfulness when eating, and planning for success  Additional resources provided today: None  Evidence-based interventions for health behavior change were utilized today including the discussion of self monitoring techniques, problem-solving barriers and SMART goal setting techniques.   Regarding patient's less desirable eating habits and patterns, we employed the technique of small changes.   Pt will specifically work on: Walk a day and drink/calculate adequate water intake for next visit.    Recommended Physical Activity Goals Pamela Love has been advised to work up to 150 minutes of moderate intensity aerobic activity a week and strengthening exercises 2-3 times per week for cardiovascular health, weight loss maintenance and preservation of muscle mass.   She has agreed to :  continue to gradually increase the amount and intensity of exercise    Pharmacotherapy We discussed various medication options to help Pamela Love with her weight loss efforts and we both  agreed to : continue with nutritional and behavioral strategies and continue current anti-obesity medication regimen   FOR ASSOCIATED CONDITIONS ADDRESSED TODAY: Vitamin D deficiency Assessment:  Condition is Controlled. Labs were reviewed. Pt felt as her vitamin D level was low due to her nail beds. I informed her that her vitamin D has only dropped from 77.1 to 61.9 as of 06/19/2023. She continues OTC Cholecalciferol without difficulty.  Lab Results  Component Value Date   VD25OH 61.9 06/19/2023   VD25OH 77.1 12/19/2022   VD25OH 64.2 06/06/2022   Plan: Pt is to continue  OTC oral vitamin D supplement as directed and she was encouraged to continue to take the medicine until told otherwise. She denies need for refill. Ideal vitamin D levels reviewed with patient.   Type 2 diabetes mellitus with obesity (HCC) Assessment:  Labs were reviewed. Pt had labs done with [facility]. Her A1c remained stable at 5.5 as of 06/19/2023. She informed me that she has not checked her sugars since last lab check but notes that her fasting blood sugar was 130-135's before hand.  Pt continues to take [facility] without difficulty. She denies need for refill. She denies any N/V/D or GI upset. Pt has a elevated BUN of 37 with a creatinine of 0.90.  Lab Results  Component Value Date   HGBA1C 5.5 06/19/2023   HGBA1C 5.5 03/14/2023   HGBA1C 5.7 (H) 12/19/2022    Lab Results  Component Value Date   CREATININE 0.90 06/19/2023   BUN 37 (H) 06/19/2023   NA 141 06/19/2023   K 4.4 06/19/2023   CL 104 06/19/2023   CO2 20 06/19/2023      Component Value Date/Time  PROT 6.7 12/19/2022 1435   ALBUMIN 4.4 12/19/2022 1435   AST 22 12/19/2022 1435   ALT 25 12/19/2022 1435   ALKPHOS 108 12/19/2022 1435   BILITOT 0.5 12/19/2022 1435    Plan:  Pt is to continue Mounjaro 12.5mg  once weekly. She denies need for refill. Reminded Pamela Love if she feels poorly to check her Blood Sugar and Blood Pressure at that  time. We extensively discussed the importance of decreasing simple carbs, increasing proteins and how certain foods they eat will affect their blood sugars. Importance of f/up with PCP and all other specialists, as scheduled, was stressed to the patient today    Hypertension associated with type 2 diabetes mellitus (HCC) Assessment:  Condition is Controlled.. Pt HTN is being controlled with coreg, lasix, and Ranexa. Pt informed me that her legs have been swelling and she was recommended to continue Lasix by her cardiologist. Pt endorses not consuming over consuming foods high in salt.  Last 3 blood pressure readings in our office are as follows: BP Readings from Last 3 Encounters:  07/03/23 137/81  06/19/23 130/75  06/05/23 124/78   Plan: Continue with Lasix, Ranexa, and Coreg at current dose. Pamela Love Pamela Love BP is 137/81 at goal today. Avoid buying foods that are: processed, frozen, or prepackaged to avoid excess salt. Unless pre-existing renal or cardiopulmonary conditions exist which patient was told to limit their fluid intake by another provider, I recommended roughly one half of their weight in pounds, to be the approximate ounces of non-caloric, non-caffeinated beverages they should drink per day; including more if they are engaging in exercise. Increasing water intake and physical activity will help alleviate constipation symptoms. We will continue to monitor closely alongside PCP/ specialists.   Follow up:   Return in about 2 weeks (around 07/17/2023). She was informed of the importance of frequent follow up visits to maximize her success with intensive lifestyle modifications for her multiple health conditions.  Subjective:   Chief complaint: Obesity Pamela Love is here to discuss her progress with her obesity treatment plan. She is on the the Category 2 Plan and states she is following her eating plan approximately 60% of the time. She states she is exercising 60 minutes 2 days per  week and going to the gym 90 minutes 1 day a week.   Interval History:  Pamela Love is here for a follow up office visit. Since last OV,  she has been well. Pt notes that she has fell off plan occasionally during Thanksgiving. She informed me that she has a couple of Christmas parties coming up and with them comes temptation. Pt informed me that her legs have been swelling a Love recently. She was recommended compression hose by Pamela Love and tried them but did not like this as it was uncomfortably too tight.   Barriers identified: none.   Pharmacotherapy for weight loss: She is currently taking Monjauro with diabetes as the primary indication with adequate clinical response  and without side effects..   Review of Systems:  Pertinent positives were addressed with patient today.  Reviewed by clinician on day of visit: allergies, medications, problem list, medical history, surgical history, family history, social history, and previous encounter notes.  Weight Summary and Biometrics   Weight Lost Since Last Visit: 0lb  Weight Gained Since Last Visit: 0lb    Vitals Temp: 98.1 F (36.7 C) BP: 137/81 Pulse Rate: 72 SpO2: 98 %   Anthropometric Measurements Height: 5\' 3"  (1.6 m) Weight: 168  lb (76.2 kg) BMI (Calculated): 29.77 Weight at Last Visit: 168lb Weight Lost Since Last Visit: 0lb Weight Gained Since Last Visit: 0lb Starting Weight: 227lb Total Weight Loss (lbs): 59 lb (26.8 kg) Peak Weight: 250lb   Body Composition  Body Fat %: 44 % Fat Mass (lbs): 74 lbs Muscle Mass (lbs): 89.4 lbs Total Body Water (lbs): 76.4 lbs Visceral Fat Rating : 13   Other Clinical Data Fasting: no Labs: no Today's Visit #: 70 Starting Date: 03/17/20    Objective:   PHYSICAL EXAM: Blood pressure 137/81, pulse 72, temperature 98.1 F (36.7 C), height 5\' 3"  (1.6 m), weight 168 lb (76.2 kg), SpO2 98%. Body mass index is 29.76 kg/m.  General: she is overweight, cooperative and  in no acute distress. PSYCH: Has normal mood, affect and thought process.   HEENT: EOMI, sclerae are anicteric. Lungs: Normal breathing effort, no conversational dyspnea. Extremities: Moves * 4 Neurologic: A and O * 3, good insight  DIAGNOSTIC DATA REVIEWED: BMET    Component Value Date/Time   NA 141 06/19/2023 1504   K 4.4 06/19/2023 1504   CL 104 06/19/2023 1504   CO2 20 06/19/2023 1504   GLUCOSE 86 06/19/2023 1504   GLUCOSE 92 03/14/2023 1413   BUN 37 (H) 06/19/2023 1504   CREATININE 0.90 06/19/2023 1504   CALCIUM 9.6 06/19/2023 1504   GFRNONAA 54 (L) 11/13/2021 1933   GFRAA 54 (L) 05/12/2020 1557   Lab Results  Component Value Date   HGBA1C 5.5 06/19/2023   HGBA1C 6 1 02/09/2015   No results found for: "INSULIN" Lab Results  Component Value Date   TSH 1.71 05/30/2018   CBC    Component Value Date/Time   WBC 7.5 11/13/2021 1933   RBC 4.44 11/13/2021 1933   HGB 14.0 11/13/2021 1933   HGB 13.8 01/26/2021 1427   HCT 41.3 11/13/2021 1933   HCT 41.6 01/26/2021 1427   PLT 243 11/13/2021 1933   PLT 229 01/26/2021 1427   MCV 93.0 11/13/2021 1933   MCV 92 01/26/2021 1427   MCH 31.5 11/13/2021 1933   MCHC 33.9 11/13/2021 1933   RDW 12.1 11/13/2021 1933   RDW 13.2 01/26/2021 1427   Iron Studies No results found for: "IRON", "TIBC", "FERRITIN", "IRONPCTSAT" Lipid Panel     Component Value Date/Time   CHOL 133 12/19/2022 1435   TRIG 110 12/19/2022 1435   HDL 46 12/19/2022 1435   CHOLHDL 2.9 12/19/2022 1435   LDLCALC 67 12/19/2022 1435   Hepatic Function Panel     Component Value Date/Time   PROT 6.7 12/19/2022 1435   ALBUMIN 4.4 12/19/2022 1435   AST 22 12/19/2022 1435   ALT 25 12/19/2022 1435   ALKPHOS 108 12/19/2022 1435   BILITOT 0.5 12/19/2022 1435      Component Value Date/Time   TSH 1.71 05/30/2018 0000   Nutritional Lab Results  Component Value Date   VD25OH 61.9 06/19/2023   VD25OH 77.1 12/19/2022   VD25OH 64.2 06/06/2022     Attestations:   I, Clinical biochemist, acting as a Stage manager for [employer], DO., have compiled all relevant documentation for today's office visit on behalf of Pamela Lot, DO, while in the presence of [employer], DO.  Reviewed by clinician on day of visit: allergies, medications, problem list, medical history, surgical history, family history, social history, and previous encounter notes pertinent to patient's obesity diagnosis. preparing to see patient (e.g. review and interpretation of tests, old notes ), obtaining and/or reviewing  separately obtained history, performing a medically appropriate examination or evaluation, counseling and educating the patient, ordering medications, test or procedures, documenting clinical information in the electronic or other health care record, and independently interpreting results and communicating results to the patient, family, or caregiver   I have reviewed the above documentation for accuracy and completeness, and I agree with the above. Pamela Love, D.O.  The 21st Century Cures Act was signed into law in 2016 which includes the topic of electronic health records.  This provides immediate access to information in MyChart.  This includes consultation notes, operative notes, office notes, lab results and pathology reports.  If you have any questions about what you read please let us know at your next visit so we can discuss your concerns and take corrective action if need be.  We are right here with you.

## 2023-07-03 NOTE — Telephone Encounter (Signed)
Pt calling in stating they have not received fax. Please advise. She confirmed with two different people there that the fax number is correct.

## 2023-07-05 NOTE — Telephone Encounter (Signed)
Reached out to patient and confirmed her fax had been received by [facility] today.

## 2023-07-18 ENCOUNTER — Ambulatory Visit (INDEPENDENT_AMBULATORY_CARE_PROVIDER_SITE_OTHER): Payer: Medicare PPO | Admitting: Adult Health

## 2023-07-18 ENCOUNTER — Encounter (INDEPENDENT_AMBULATORY_CARE_PROVIDER_SITE_OTHER): Payer: Self-pay | Admitting: Adult Health

## 2023-07-18 ENCOUNTER — Encounter (HOSPITAL_BASED_OUTPATIENT_CLINIC_OR_DEPARTMENT_OTHER): Payer: Self-pay | Admitting: Cardiology

## 2023-07-18 ENCOUNTER — Ambulatory Visit (HOSPITAL_BASED_OUTPATIENT_CLINIC_OR_DEPARTMENT_OTHER): Payer: Medicare PPO | Admitting: Cardiology

## 2023-07-18 VITALS — BP 139/77 | HR 69 | Temp 97.7°F | Ht 63.0 in | Wt 167.0 lb

## 2023-07-18 VITALS — BP 122/70 | HR 75 | Ht 63.0 in | Wt 170.0 lb

## 2023-07-18 DIAGNOSIS — E1169 Type 2 diabetes mellitus with other specified complication: Secondary | ICD-10-CM

## 2023-07-18 DIAGNOSIS — E782 Mixed hyperlipidemia: Secondary | ICD-10-CM | POA: Diagnosis not present

## 2023-07-18 DIAGNOSIS — I152 Hypertension secondary to endocrine disorders: Secondary | ICD-10-CM | POA: Diagnosis not present

## 2023-07-18 DIAGNOSIS — I10 Essential (primary) hypertension: Secondary | ICD-10-CM

## 2023-07-18 DIAGNOSIS — N1831 Chronic kidney disease, stage 3a: Secondary | ICD-10-CM | POA: Diagnosis not present

## 2023-07-18 DIAGNOSIS — Z6832 Body mass index (BMI) 32.0-32.9, adult: Secondary | ICD-10-CM

## 2023-07-18 DIAGNOSIS — E669 Obesity, unspecified: Secondary | ICD-10-CM

## 2023-07-18 DIAGNOSIS — E876 Hypokalemia: Secondary | ICD-10-CM

## 2023-07-18 DIAGNOSIS — Z7985 Long-term (current) use of injectable non-insulin antidiabetic drugs: Secondary | ICD-10-CM

## 2023-07-18 DIAGNOSIS — Z7189 Other specified counseling: Secondary | ICD-10-CM | POA: Diagnosis not present

## 2023-07-18 DIAGNOSIS — I2510 Atherosclerotic heart disease of native coronary artery without angina pectoris: Secondary | ICD-10-CM

## 2023-07-18 DIAGNOSIS — E1159 Type 2 diabetes mellitus with other circulatory complications: Secondary | ICD-10-CM | POA: Diagnosis not present

## 2023-07-18 DIAGNOSIS — Z794 Long term (current) use of insulin: Secondary | ICD-10-CM | POA: Diagnosis not present

## 2023-07-18 DIAGNOSIS — Z955 Presence of coronary angioplasty implant and graft: Secondary | ICD-10-CM | POA: Diagnosis not present

## 2023-07-18 DIAGNOSIS — E66811 Obesity, class 1: Secondary | ICD-10-CM | POA: Diagnosis not present

## 2023-07-18 DIAGNOSIS — E1122 Type 2 diabetes mellitus with diabetic chronic kidney disease: Secondary | ICD-10-CM

## 2023-07-18 DIAGNOSIS — Z6829 Body mass index (BMI) 29.0-29.9, adult: Secondary | ICD-10-CM | POA: Diagnosis not present

## 2023-07-18 DIAGNOSIS — E6609 Other obesity due to excess calories: Secondary | ICD-10-CM

## 2023-07-18 NOTE — Patient Instructions (Addendum)
Medication Instructions:  Your physician recommends that you continue on your current medications as directed. Please refer to the Current Medication list given to you today.  *If you need a refill on your cardiac medications before your next appointment, please call your pharmacy*  Lab Work: BMET TODAY   Testing/Procedures: NONE  Follow-Up: At [facility], you and your health needs are our priority.  As part of our continuing mission to provide you with exceptional heart care, we have created designated Provider Care Teams.  These Care Teams include your primary Cardiologist (physician) and Advanced Practice Providers (APPs -  Physician Assistants and Nurse Practitioners) who all work together to provide you with the care you need, when you need it.  We recommend signing up for the patient portal called "MyChart".  Sign up information is provided on this After Visit Summary.  MyChart is used to connect with patients for Virtual Visits (Telemedicine).  Patients are able to view lab/test results, encounter notes, upcoming appointments, etc.  Non-urgent messages can be sent to your provider as well.   To learn more about what you can do with MyChart, go to [email or web address].    Your next appointment:   12 month(s)  The format for your next appointment:   In Person  Provider:   Jodelle Red, MD

## 2023-07-18 NOTE — Progress Notes (Signed)
WEIGHT SUMMARY AND BIOMETRICS  Vitals Temp: 97.7 F (36.5 C) BP: 139/77 Pulse Rate: 69 SpO2: 99 %   Anthropometric Measurements Height: 5\' 3"  (1.6 m) Weight: 167 lb (75.8 kg) BMI (Calculated): 29.59 Weight at Last Visit: 168 lb Weight Lost Since Last Visit: 1 lb Weight Gained Since Last Visit: 0 Starting Weight: 227 lb Total Weight Loss (lbs): 60 lb (27.2 kg) Peak Weight: 250 lb   Body Composition  Body Fat %: 44.2 % Fat Mass (lbs): 74.2 lbs Muscle Mass (lbs): 88.8 lbs Visceral Fat Rating : 13   Other Clinical Data Fasting: no Labs: no Today's Visit #: 41 Starting Date: 03/17/20    Chief Complaint:   OBESITY Pamela Love is here to discuss her progress with her obesity treatment plan. She is on the the Category 2 Plan and states she is following her eating plan approximately 65 % of the time.  She states she is exercising walking/dancing/cardio at gym 90 minutes 2 times per week.   Interim History:  Pamela Love is please to have maintained/lost 1 lbs since last OV at [facility]. She will be hosting several family members over the Christmas holiday.  In the last several months- Lasix 40mg  3 times per week STOPPED Toujeo STOPPED  She has lost > 60 lbs Current weight 167 lbs with corresponding BMI 29.7 Goal weight 160 lbs with corresponding BMI 28.3  Subjective:   1. Stage 3a chronic kidney disease (HCC) 05/25/2023 Lasix therapy stopped due to sx's of hypotension. She has remained on ranolazine (RANEXA) 1000 MG SR tablet  carvedilol (COREG) 3.125 MG tablet    2. Type 2 diabetes mellitus with obesity (HCC) She has been off- Toujeo since August 2024 She is currently on weekly Mounjaro 12.5mg   Denies mass in neck, dysphagia, dyspepsia, persistent hoarseness, abdominal pain, or N/V/C   She has not been checking any CBG readings at home due to hectic schedule. She denies sx's of hypoglycemia.  3. Hypertension associated with type 2 diabetes mellitus  (HCC) 05/25/2023 Lasix therapy stopped due to sx's of hypotension. She has remained on ranolazine (RANEXA) 1000 MG SR tablet  carvedilol (COREG) 3.125 MG tablet   BP stable at OV She denies dizziness or extreme fatigue  Assessment/Plan:   1. Stage 3a chronic kidney disease (HCC) Remain well hydrated Avoid nephrotoxic substances Monitor Labs  2. Type 2 diabetes mellitus with obesity (HCC) (Primary) Recommend checking CBG a few times a week or if any sx's of hypoglycemia develop. Continue weekly Mounjaro 12.5mg - does not require refill today  3. Hypertension associated with type 2 diabetes mellitus (HCC) Monitor home readings Monitor for sx's of hypotension Remains well hydrated  4. BMI 32.0-32.9,adult-CURRENT BMI 29.59  Pamela Love is currently in the action stage of change. As such, her goal is to continue with weight loss efforts. She has agreed to the Category 2 Plan.   Exercise goals: Older adults should follow the adult guidelines. When older adults cannot meet the adult guidelines, they should be as physically active as their abilities and conditions will allow.  Older adults should do exercises that maintain or improve balance if they are at risk of falling.  Older adults should determine their level of effort for physical activity relative to their level of fitness.  Older adults with chronic conditions should understand whether and how their conditions affect their ability to do regular physical activity safely.  Behavioral modification strategies: increasing lean protein intake, decreasing simple carbohydrates, increasing vegetables, increasing water intake, meal  planning and cooking strategies, keeping healthy foods in the home, ways to avoid boredom eating, and planning for success.  Pamela Love has agreed to follow-up with our clinic in 2 weeks. She was informed of the importance of frequent follow-up visits to maximize her success with intensive lifestyle modifications for her  multiple health conditions.   Objective:   Blood pressure 139/77, pulse 69, temperature 97.7 F (36.5 C), height 5\' 3"  (1.6 m), weight 167 lb (75.8 kg), SpO2 99%. Body mass index is 29.58 kg/m.  General: Cooperative, alert, well developed, in no acute distress. HEENT: Conjunctivae and lids unremarkable. Cardiovascular: Regular rhythm.  Lungs: Normal work of breathing. Neurologic: No focal deficits.   Lab Results  Component Value Date   CREATININE 0.90 06/19/2023   BUN 37 (H) 06/19/2023   NA 141 06/19/2023   K 4.4 06/19/2023   CL 104 06/19/2023   CO2 20 06/19/2023   Lab Results  Component Value Date   ALT 25 12/19/2022   AST 22 12/19/2022   ALKPHOS 108 12/19/2022   BILITOT 0.5 12/19/2022   Lab Results  Component Value Date   HGBA1C 5.5 06/19/2023   HGBA1C 5.5 03/14/2023   HGBA1C 5.7 (H) 12/19/2022   HGBA1C 5.5 08/30/2022   HGBA1C 6.0 (H) 06/06/2022   No results found for: "INSULIN" Lab Results  Component Value Date   TSH 1.71 05/30/2018   Lab Results  Component Value Date   CHOL 133 12/19/2022   HDL 46 12/19/2022   LDLCALC 67 12/19/2022   TRIG 110 12/19/2022   CHOLHDL 2.9 12/19/2022   Lab Results  Component Value Date   VD25OH 61.9 06/19/2023   VD25OH 77.1 12/19/2022   VD25OH 64.2 06/06/2022   Lab Results  Component Value Date   WBC 7.5 11/13/2021   HGB 14.0 11/13/2021   HCT 41.3 11/13/2021   MCV 93.0 11/13/2021   PLT 243 11/13/2021   No results found for: "IRON", "TIBC", "FERRITIN"  Attestation Statements:   Reviewed by clinician on day of visit: allergies, medications, problem list, medical history, surgical history, family history, social history, and previous encounter notes.  Time spent on visit including pre-visit chart review and post-visit care and charting was 26 minutes.   I have reviewed the above documentation for accuracy and completeness, and I agree with the above. -  Borden Thune d. Almer Littleton, NP-C

## 2023-07-18 NOTE — Progress Notes (Signed)
Cardiology Office Note:  .   Date:  07/18/2023  ID:  Pamela Love, DOB 12/28/1950, MRN [id number] PCP: Lupita Raider, MD  [facility] Providers Cardiologist:  Jodelle Red, MD {  History of Present Illness: .   Pamela Love is a 72 y.o. female with a hx of arthritis, CAD, CKD stage IIIa, diabetes type 2,  hyperlipidemia, hypertension, obesity, and OSA on CPAP who is seen for follow-up. She was previously a patient of Dr. Servando Salina.      Prior clinical ASCVD: CAD. From May 11-June 27th 2022 she had 5 "anginal attacks". Since her stent placement in 01/2021 her symptoms are much reduced. Comorbid conditions: CKD stage IIIa, diabetes type 2, hyperlipidemia, hypertension, metabolic syndrome  Today: Stopped lasix due to lightheadedness, but notes that she has mild bilateral edema at the end of the day. Hasn't tolerated compression stockings. Discussed elevation today. No shortness of breath, even with walking on the treadmill for 30 minutes. No chest pain either.   Continues to lose weight with her program, congratulated. Off insulin. Doing well with mounjaro. Has lost 60 lbs.  Has not required nitroglycerin since adjusting her GI regimen.  ROS: Denies chest pain, shortness of breath at rest or with normal exertion. No PND, orthopnea, LE edema or unexpected weight gain. No syncope or palpitations. ROS otherwise negative except as noted.   Studies Reviewed: Marland Kitchen    EKG:       Physical Exam:   VS:  BP 122/70 (BP Location: Left Arm, Cuff Size: Normal)   Pulse 75   Ht 5\' 3"  (1.6 m)   Wt 170 lb (77.1 kg)   SpO2 96%   BMI 30.11 kg/m    Wt Readings from Last 3 Encounters:  07/18/23 170 lb (77.1 kg)  07/03/23 168 lb (76.2 kg)  06/19/23 168 lb (76.2 kg)    GEN: Well nourished, well developed in no acute distress HEENT: Normal, moist mucous membranes NECK: No JVD CARDIAC: regular rhythm, normal S1 and S2, no rubs or gallops. No murmur. VASCULAR: Radial and DP  pulses 2+ bilaterally. No carotid bruits RESPIRATORY:  Clear to auscultation without rales, wheezing or rhonchi  ABDOMEN: Soft, non-tender, non-distended MUSCULOSKELETAL:  Ambulates independently SKIN: Warm and dry, no edema NEUROLOGIC:  Alert and oriented x 3. No focal neuro deficits noted. PSYCHIATRIC:  Normal affect    ASSESSMENT AND PLAN: .    CAD, with prior PCI Mixed hyperlipidemia Type II diabetes Obesity, BMI 30 (starting BMI 40 in 2021) -Aspirin 81 mg, clopidogrel 75 mg -statin: continue rosuvastatin 40 mg daily -LDL goal <70, LDL 67 11/2022 -antianginals: beta blocker (carvedilol), ranolazine. Not on isosorbide as it made her hypotensive -Given type II diabetes and CAD, she is on GLP1RA. Tolerates mounjaro better than ozempic -has prescription for sublingual nitroglycerin, counseled on lifespan of this medication -counseled on diet recommendations and AHA exercise guidelines, below -risk factor modification, as below -counseled on red flag warning signs that need immediate medical attention  -no chest pain since GI meds adjusted -lasix stopped with lightheadedness, has mild end of day edema but resolves on its own   Hypertension:  -at goal of <130/80, continue carvedilol   OSA: -followed by Dr. Tresa Endo, benefits from CPAP therapy for her OSA.  CV risk counseling and prevention -recommend heart healthy/Mediterranean diet, with whole grains, fruits, vegetable, fish, lean meats, nuts, and olive oil. Limit salt. -recommend moderate walking, 3-5 times/week for 30-50 minutes each session. Aim for at least 150 minutes.week. Goal  should be pace of 3 miles/hours, or walking 1.5 miles in 30 minutes -recommend avoidance of tobacco products. Avoid excess alcohol.  Dispo: 1 year or sooner as needed  Signed, Jodelle Red, MD   Jodelle Red, MD, PhD, [facility]  [facility]  [place]  [facility] & Vascular at [facility] [address] [place], [place] [address] [phone]

## 2023-07-19 LAB — BASIC METABOLIC PANEL
BUN/Creatinine Ratio: 37 — ABNORMAL HIGH (ref 12–28)
BUN: 38 mg/dL — ABNORMAL HIGH (ref 8–27)
CO2: 24 mmol/L (ref 20–29)
Calcium: 10 mg/dL (ref 8.7–10.3)
Chloride: 104 mmol/L (ref 96–106)
Creatinine, Ser: 1.03 mg/dL — ABNORMAL HIGH (ref 0.57–1.00)
Glucose: 88 mg/dL (ref 70–99)
Potassium: 4.9 mmol/L (ref 3.5–5.2)
Sodium: 142 mmol/L (ref 134–144)
eGFR: 57 mL/min/{1.73_m2} — ABNORMAL LOW (ref >59)

## 2023-07-20 ENCOUNTER — Encounter (INDEPENDENT_AMBULATORY_CARE_PROVIDER_SITE_OTHER): Payer: Self-pay

## 2023-07-20 DIAGNOSIS — E1122 Type 2 diabetes mellitus with diabetic chronic kidney disease: Secondary | ICD-10-CM | POA: Diagnosis not present

## 2023-07-20 DIAGNOSIS — I25119 Atherosclerotic heart disease of native coronary artery with unspecified angina pectoris: Secondary | ICD-10-CM | POA: Diagnosis not present

## 2023-07-20 DIAGNOSIS — E782 Mixed hyperlipidemia: Secondary | ICD-10-CM | POA: Diagnosis not present

## 2023-07-20 DIAGNOSIS — G4733 Obstructive sleep apnea (adult) (pediatric): Secondary | ICD-10-CM | POA: Diagnosis not present

## 2023-07-20 DIAGNOSIS — Z0000 Encounter for general adult medical examination without abnormal findings: Secondary | ICD-10-CM | POA: Diagnosis not present

## 2023-07-20 DIAGNOSIS — N3281 Overactive bladder: Secondary | ICD-10-CM | POA: Diagnosis not present

## 2023-07-20 DIAGNOSIS — M1990 Unspecified osteoarthritis, unspecified site: Secondary | ICD-10-CM | POA: Diagnosis not present

## 2023-07-20 DIAGNOSIS — I2510 Atherosclerotic heart disease of native coronary artery without angina pectoris: Secondary | ICD-10-CM | POA: Diagnosis not present

## 2023-07-20 DIAGNOSIS — I129 Hypertensive chronic kidney disease with stage 1 through stage 4 chronic kidney disease, or unspecified chronic kidney disease: Secondary | ICD-10-CM | POA: Diagnosis not present

## 2023-07-20 LAB — HEPATIC FUNCTION PANEL
ALT: 27 U/L (ref 7–35)
AST: 21 (ref 13–35)
Alkaline Phosphatase: 117 (ref 25–125)
Bilirubin, Total: 0.5

## 2023-07-20 LAB — COMPREHENSIVE METABOLIC PANEL
Albumin: 4.3 (ref 3.5–5.0)
Calcium: 9.8 (ref 8.7–10.7)
eGFR: 60

## 2023-07-20 LAB — LIPID PANEL
Cholesterol: 143 (ref 0–200)
HDL: 93 — AB (ref 35–70)
LDL Cholesterol: 75
Triglycerides: 97 (ref 40–160)

## 2023-07-20 LAB — BASIC METABOLIC PANEL
BUN: 34 — AB (ref 4–21)
CO2: 27 — AB (ref 13–22)
Chloride: 107 (ref 99–108)
Creatinine: 1 (ref 0.5–1.1)
Glucose: 107
Potassium: 4.2 meq/L (ref 3.5–5.1)
Sodium: 139 (ref 137–147)

## 2023-08-09 DIAGNOSIS — Z01419 Encounter for gynecological examination (general) (routine) without abnormal findings: Secondary | ICD-10-CM | POA: Diagnosis not present

## 2023-08-09 DIAGNOSIS — N952 Postmenopausal atrophic vaginitis: Secondary | ICD-10-CM | POA: Diagnosis not present

## 2023-08-09 DIAGNOSIS — Z01411 Encounter for gynecological examination (general) (routine) with abnormal findings: Secondary | ICD-10-CM | POA: Diagnosis not present

## 2023-08-09 DIAGNOSIS — N951 Menopausal and female climacteric states: Secondary | ICD-10-CM | POA: Diagnosis not present

## 2023-08-14 ENCOUNTER — Encounter (INDEPENDENT_AMBULATORY_CARE_PROVIDER_SITE_OTHER): Payer: Self-pay | Admitting: Family Medicine

## 2023-08-14 ENCOUNTER — Encounter (INDEPENDENT_AMBULATORY_CARE_PROVIDER_SITE_OTHER): Payer: Self-pay

## 2023-08-14 ENCOUNTER — Ambulatory Visit (INDEPENDENT_AMBULATORY_CARE_PROVIDER_SITE_OTHER): Payer: Medicare PPO | Admitting: Family Medicine

## 2023-08-14 VITALS — BP 150/86 | HR 70 | Temp 100.0°F | Ht 63.0 in | Wt 170.0 lb

## 2023-08-14 DIAGNOSIS — E1159 Type 2 diabetes mellitus with other circulatory complications: Secondary | ICD-10-CM

## 2023-08-14 DIAGNOSIS — E66813 Obesity, class 3: Secondary | ICD-10-CM

## 2023-08-14 DIAGNOSIS — E669 Obesity, unspecified: Secondary | ICD-10-CM | POA: Diagnosis not present

## 2023-08-14 DIAGNOSIS — E1169 Type 2 diabetes mellitus with other specified complication: Secondary | ICD-10-CM | POA: Diagnosis not present

## 2023-08-14 DIAGNOSIS — E559 Vitamin D deficiency, unspecified: Secondary | ICD-10-CM | POA: Diagnosis not present

## 2023-08-14 DIAGNOSIS — Z7985 Long-term (current) use of injectable non-insulin antidiabetic drugs: Secondary | ICD-10-CM

## 2023-08-14 DIAGNOSIS — I152 Hypertension secondary to endocrine disorders: Secondary | ICD-10-CM | POA: Diagnosis not present

## 2023-08-14 DIAGNOSIS — Z6830 Body mass index (BMI) 30.0-30.9, adult: Secondary | ICD-10-CM

## 2023-08-14 NOTE — Progress Notes (Signed)
 Pamela Love, D.O.  ABFM, ABOM Specializing in Clinical Bariatric Medicine  Office located at: [address] [place], [place]  [address]   Assessment and Plan:   FOR THE DISEASE OF OBESITY: BMI 30.0-30.9,adult -- Current BMI 30.12 Obesity Assessment & Plan: Since last office visit on 07/18/23 patient's muscle mass has increased by 2lb. Fat mass has increased by 0.8lb. Counseling done on how various foods will affect these numbers and how to maximize success  Total lbs lost to date: 57 lbs  Total weight loss percentage to date: -25.11%    Recommended Dietary Goals Pamela Love is currently in the action stage of change. As such, her goal is to continue weight management plan.  She has agreed to: continue current plan   Behavioral Intervention We discussed the following today: increasing lean protein intake to established goals, decreasing simple carbohydrates , increasing water intake , keeping healthy foods at home, decreasing eating out or consumption of processed foods, and making healthy choices when eating convenient foods, avoiding temptations and identifying enticing environmental cues, and continue to work on maintaining a reduced calorie state, getting the recommended amount of protein, incorporating whole foods, making healthy choices, staying well hydrated and practicing mindfulness when eating.  Additional resources provided today: None  Evidence-based interventions for health behavior change were utilized today including the discussion of self monitoring techniques, problem-solving barriers and SMART goal setting techniques.   Regarding patient's less desirable eating habits and patterns, we employed the technique of small changes.   Pt will specifically work on: Go to the gym at least 3 days a week and continue dancing for next visit.    Recommended Physical Activity Goals Pamela Love has been advised to work up to 150 minutes of moderate intensity aerobic activity a  week and strengthening exercises 2-3 times per week for cardiovascular health, weight loss maintenance and preservation of muscle mass.   She has agreed to :  Increase physical activity in their day and reduce sedentary time (increase NEAT). and Start strengthening exercises with a goal of 2-3 sessions a week    Pharmacotherapy We discussed various medication options to help Pamela Love with her weight loss efforts and we both agreed to : continue with nutritional and behavioral strategies and continue current anti-obesity medication regimen   FOR ASSOCIATED CONDITIONS ADDRESSED TODAY: Hypertension associated with type 2 diabetes mellitus (HCC) Assessment & Plan: BP Readings from Last 3 Encounters:  08/14/23 (!) 150/86  07/18/23 139/77  07/18/23 122/70   BP above goal today. Pt compliant with Coreg  3.125 mg BID. Stasia went off Lasix  due to low BP readings and lightheadedness on 05/24/23. Since then her systolic BP has gradually increased to the 130s after the holidays. She denies any headaches, visual changes, chest pains, or any other symptoms. Pt is established with Dr. Shelda Bruckner of cardiology, last followed up on 07/18/23. Has started using Dr. Motion compression socks to help manage bilateral edema.   Reviewed BP goal of 140/90 or less, pt verbalized understanding. Continue monitoring BP readings at home at least every other day and keep track of any readings above goal. Cut and sugars/salts consumption. Continue with Coreg  as directed by cardiologist. We will continue to monitor alongside cardiology/PCP.    Type 2 diabetes mellitus with obesity [facility]) Assessment & Plan: Lab Results  Component Value Date   HGBA1C 5.5 06/19/2023   HGBA1C 5.5 03/14/2023   HGBA1C 5.7 (H) 12/19/2022    A1C was at goal at 5.5. on 06/2023.  Stable and well controlled on Mounjaro  12.5 mg once weekly. Tolerating well with no side effects reported. She has not been checking her sugars at home but denies  any symptoms of low or high sugars. Pt admits to drinking an insufficient amount of water daily, not enough water.   Pt was encouraged to increase her water intake to at least half her body weight in ounces daily. Advised pt to monitor her sugars daily and keep track of any low/high readings. Avoid simple carbs/sugars, high sodium foods, and white flours foods. Increase exercise to at least 3 days a week in the gym. Continue with Mounjaro  at current dose.    Vitamin D  deficiency Assessment & Plan: Lab Results  Component Value Date   VD25OH 61.9 06/19/2023   VD25OH 77.1 12/19/2022   VD25OH 64.2 06/06/2022   Last vitamin D  was at goal. Taking 2000 units once daily. Tolerating well, no adverse side effects. No acute concerns in this regard today.   Continue with current supplementation. No changes made today. We will continue to monitor her condition as it relates to her weight loss journey.    Follow up:   Return in about 2 weeks (around 08/28/2023). She was informed of the importance of frequent follow up visits to maximize her success with intensive lifestyle modifications for her multiple health conditions.  Subjective:   Chief complaint: Obesity Pamela Love is here to discuss her progress with her obesity treatment plan. She is on the the Category 2 Plan and states she is following her eating plan approximately 60% of the time. She states she is dancing 60 minutes 1 day per week and cardio 90 minutes 1 days per week.  Interval History:  Pamela Love is here for a follow up office visit. Since last OV, she is up 3 lbs. Reports some off-plan eating over the holidays. States she has been taking care of her grand kids over the weekends. Plans to remove unhealthy foods in her home/environment to eliminate temptations by next Monday.   Barriers identified: strong hunger signals and impaired satiety / inhibitory control, having difficulty preparing healthy meals, and having difficulty  focusing on healthy eating. Off-plan over the holidays.  Pharmacotherapy for weight loss: She is currently taking Monjauro with diabetes as the primary indication with adequate clinical response  and without side effects..   Review of Systems:  Pertinent positives were addressed with patient today.  Reviewed by clinician on day of visit: allergies, medications, problem list, medical history, surgical history, family history, social history, and previous encounter notes.  Weight Summary and Biometrics   Weight Lost Since Last Visit: 0 lb  Weight Gained Since Last Visit: 3 lb    Vitals Temp: 100 F (37.8 C) BP: (!) 150/86 Pulse Rate: 70 SpO2: 99 %   Anthropometric Measurements Height: 5' 3 (1.6 m) Weight: 170 lb (77.1 kg) BMI (Calculated): 30.12 Weight at Last Visit: 167 lb Weight Lost Since Last Visit: 0 lb Weight Gained Since Last Visit: 3 lb Starting Weight: 227 lb Total Weight Loss (lbs): 57 lb (25.9 kg) Peak Weight: 250 lb   Body Composition  Body Fat %: 44 % Fat Mass (lbs): 75 lbs Muscle Mass (lbs): 90.8 lbs Total Body Water (lbs): 75 lbs Visceral Fat Rating : 13   Other Clinical Data Fasting: No Labs: No Today's Visit #: 72 Starting Date: 03/17/20    Objective:   PHYSICAL EXAM: Blood pressure (!) 150/86, pulse 70, temperature 100 F (37.8 C), height 5' 3 (  1.6 m), weight 170 lb (77.1 kg), SpO2 99%. Body mass index is 30.11 kg/m.  General: she is overweight, cooperative and in no acute distress. PSYCH: Has normal mood, affect and thought process.   HEENT: EOMI, sclerae are anicteric. Lungs: Normal breathing effort, no conversational dyspnea. Extremities: Moves * 4 Neurologic: A and O * 3, good insight  DIAGNOSTIC DATA REVIEWED: BMET    Component Value Date/Time   NA 139 07/20/2023 0000   K 4.2 07/20/2023 0000   CL 107 07/20/2023 0000   CO2 27 (A) 07/20/2023 0000   GLUCOSE 88 07/18/2023 1357   GLUCOSE 92 03/14/2023 1413   BUN 34 (A)  07/20/2023 0000   CREATININE 1.0 07/20/2023 0000   CREATININE 1.03 (H) 07/18/2023 1357   CALCIUM  9.8 07/20/2023 0000   GFRNONAA 54 (L) 11/13/2021 1933   GFRAA 54 (L) 05/12/2020 1557   Lab Results  Component Value Date   HGBA1C 5.5 06/19/2023   HGBA1C 6 1 02/09/2015   No results found for: INSULIN  Lab Results  Component Value Date   TSH 1.71 05/30/2018   CBC    Component Value Date/Time   WBC 7.5 11/13/2021 1933   RBC 4.44 11/13/2021 1933   HGB 14.0 11/13/2021 1933   HGB 13.8 01/26/2021 1427   HCT 41.3 11/13/2021 1933   HCT 41.6 01/26/2021 1427   PLT 243 11/13/2021 1933   PLT 229 01/26/2021 1427   MCV 93.0 11/13/2021 1933   MCV 92 01/26/2021 1427   MCH 31.5 11/13/2021 1933   MCHC 33.9 11/13/2021 1933   RDW 12.1 11/13/2021 1933   RDW 13.2 01/26/2021 1427   Iron Studies No results found for: IRON, TIBC, FERRITIN, IRONPCTSAT Lipid Panel     Component Value Date/Time   CHOL 143 07/20/2023 0000   CHOL 133 12/19/2022 1435   TRIG 97 07/20/2023 0000   HDL 93 (A) 07/20/2023 0000   HDL 46 12/19/2022 1435   CHOLHDL 2.9 12/19/2022 1435   LDLCALC 75 07/20/2023 0000   LDLCALC 67 12/19/2022 1435   Hepatic Function Panel     Component Value Date/Time   PROT 6.7 12/19/2022 1435   ALBUMIN 4.3 07/20/2023 0000   ALBUMIN 4.4 12/19/2022 1435   AST 21 07/20/2023 0000   ALT 27 07/20/2023 0000   ALKPHOS 117 07/20/2023 0000   BILITOT 0.5 12/19/2022 1435      Component Value Date/Time   TSH 1.71 05/30/2018 0000   Nutritional Lab Results  Component Value Date   VD25OH 61.9 06/19/2023   VD25OH 77.1 12/19/2022   VD25OH 64.2 06/06/2022    Attestations:   I, Vernell Forest , acting as a stage manager for Pamela Jenkins, DO., have compiled all relevant documentation for today's office visit on behalf of Pamela Jenkins, DO, while in the presence of [employer], DO.  Reviewed by clinician on day of visit: allergies, medications, problem list, medical history,  surgical history, family history, social history, and previous encounter notes pertinent to patient's obesity diagnosis.  I have reviewed the above documentation for accuracy and completeness, and I agree with the above. Pamela JINNY Love, D.O.  The 21st Century Cures Act was signed into law in 2016 which includes the topic of electronic health records.  This provides immediate access to information in MyChart.  This includes consultation notes, operative notes, office notes, lab results and pathology reports.  If you have any questions about what you read please let us  know at your next visit so we can discuss your  concerns and take corrective action if need be.  We are right here with you.

## 2023-08-18 ENCOUNTER — Other Ambulatory Visit: Payer: Self-pay | Admitting: Family Medicine

## 2023-08-18 DIAGNOSIS — Z1231 Encounter for screening mammogram for malignant neoplasm of breast: Secondary | ICD-10-CM

## 2023-08-21 ENCOUNTER — Telehealth (INDEPENDENT_AMBULATORY_CARE_PROVIDER_SITE_OTHER): Payer: Self-pay

## 2023-08-21 NOTE — Telephone Encounter (Signed)
PA for Mounjaro 15MG  has been approved. PA is now complete.     Outcome Approved on January 17 by [facility] NCPDP 2017 PA Case: [id number], Status: Approved, Coverage Starts on: 08/02/2023 12:00:00 AM, Coverage Ends on: 07/31/2024 12:00:00 AM. Questions? Contact [phone]. Authorization Expiration Date: 07/30/2024

## 2023-08-25 DIAGNOSIS — G4733 Obstructive sleep apnea (adult) (pediatric): Secondary | ICD-10-CM | POA: Diagnosis not present

## 2023-08-28 ENCOUNTER — Encounter (INDEPENDENT_AMBULATORY_CARE_PROVIDER_SITE_OTHER): Payer: Self-pay | Admitting: Adult Health

## 2023-08-28 ENCOUNTER — Other Ambulatory Visit (HOSPITAL_BASED_OUTPATIENT_CLINIC_OR_DEPARTMENT_OTHER): Payer: Self-pay

## 2023-08-28 ENCOUNTER — Ambulatory Visit (INDEPENDENT_AMBULATORY_CARE_PROVIDER_SITE_OTHER): Payer: Medicare PPO | Admitting: Adult Health

## 2023-08-28 VITALS — BP 133/79 | HR 69 | Temp 98.0°F | Ht 63.0 in | Wt 169.0 lb

## 2023-08-28 DIAGNOSIS — E1169 Type 2 diabetes mellitus with other specified complication: Secondary | ICD-10-CM

## 2023-08-28 DIAGNOSIS — Z7985 Long-term (current) use of injectable non-insulin antidiabetic drugs: Secondary | ICD-10-CM

## 2023-08-28 DIAGNOSIS — E669 Obesity, unspecified: Secondary | ICD-10-CM

## 2023-08-28 DIAGNOSIS — E559 Vitamin D deficiency, unspecified: Secondary | ICD-10-CM | POA: Diagnosis not present

## 2023-08-28 DIAGNOSIS — Z6832 Body mass index (BMI) 32.0-32.9, adult: Secondary | ICD-10-CM

## 2023-08-28 DIAGNOSIS — Z6830 Body mass index (BMI) 30.0-30.9, adult: Secondary | ICD-10-CM

## 2023-08-28 DIAGNOSIS — E1159 Type 2 diabetes mellitus with other circulatory complications: Secondary | ICD-10-CM

## 2023-08-28 DIAGNOSIS — I152 Hypertension secondary to endocrine disorders: Secondary | ICD-10-CM | POA: Diagnosis not present

## 2023-08-28 MED ORDER — TIRZEPATIDE 12.5 MG/0.5ML ~~LOC~~ SOAJ
12.5000 mg | SUBCUTANEOUS | 0 refills | Status: DC
  Filled 2023-08-28 – 2023-09-22 (×2): qty 6, 84d supply, fill #0

## 2023-08-28 NOTE — Progress Notes (Signed)
WEIGHT SUMMARY AND BIOMETRICS  Vitals Temp: 98 F (36.7 C) BP: 133/79 Pulse Rate: 69 SpO2: 94 %   Anthropometric Measurements Height: 5\' 3"  (1.6 m) Weight: 169 lb (76.7 kg) BMI (Calculated): 29.94 Weight at Last Visit: 170 lb Weight Lost Since Last Visit: 1 lb Weight Gained Since Last Visit: 0 Starting Weight: 227 lb Total Weight Loss (lbs): 58 lb (26.3 kg) Peak Weight: 250 lb   Body Composition  Body Fat %: 43.2 % Fat Mass (lbs): 73.2 lbs Muscle Mass (lbs): 91.4 lbs Total Body Water (lbs): 74.2 lbs Visceral Fat Rating : 12   Other Clinical Data Fasting: no Labs: no Today's Visit #: 20 Starting Date: 03/17/20    Chief Complaint:   OBESITY Pamela Love is here to discuss her progress with her obesity treatment plan. She is on the the Category 2 Plan and states she is following her eating plan approximately 60 % of the time.  She states she is exercising Dance and Gym Exercises 60 minutes 2-3 times per week.   Interim History:   08/28/23 12:00  Weight 169 lb (76.7 kg)  BMI (Calculated) 29.94  Total Weight Loss (lbs) 58 lb (26.3 kg)  Systolic BP 133  Diastolic BP 79  Pulse Rate 69  Fasting no  Labs no  Today's Visit # 73  Weight at Last Visit 170 lb  Weight Lost Since Last Visit 1 lb   Body Fat % 43.2 %  Muscle Mass (lbs) 91.4 lbs  Fat Mass (lbs) 73.2 lbs  Total Body Water (lbs) 74.2 lbs  Visceral Fat Rating  12   Her ultimate weight loss goal is to reach upper 150s She is thrilled that she has been able to reduce multiple medications.  Subjective:   1. Hypertension associated with type 2 diabetes mellitus (HCC) BP stable at OV She is currently on: aspirin EC 81 MG tablet  tirzepatide (MOUNJARO) 10 MG/0.5ML Pen  rosuvastatin (CRESTOR) 40 MG tablet  ranolazine (RANEXA) 1000 MG SR tablet  carvedilol (COREG) 3.125 MG tablet  tirzepatide (MOUNJARO) 12.5 MG/0.5ML Pen   She recently stopped Lasix therapy.  Home readings: SBP 110-130s DBP:  50-70s  2. Vitamin D deficiency  Latest Reference Range & Units 06/19/23 15:04  Vitamin D, 25-Hydroxy 30.0 - 100.0 ng/mL 61.9   Level at goal She is on daily OTC Vit D3 2,000 international units   3. Type 2 diabetes mellitus with obesity (HCC) Lab Results  Component Value Date   HGBA1C 5.5 06/19/2023   HGBA1C 5.5 03/14/2023   HGBA1C 5.7 (H) 12/19/2022    Only antidiabetic: tirzepatide (MOUNJARO) 12.5 MG/0.5ML Pen  weekly injections Denies mass in neck, dysphagia, dyspepsia, persistent hoarseness, abdominal pain, or N/V/C  Home readings: Fasting: 131, 143 PP: 122, 89, 81  She denies sx's of hypoglycemia  Assessment/Plan:   1. Hypertension associated with type 2 diabetes mellitus (HCC) (Primary) Remain well hydrated with water Continue to closely monitor home readings  2. Vitamin D deficiency Continue daily OTC Vit D3 2,000 international units   3. Type 2 diabetes mellitus with obesity (HCC) Refill - tirzepatide (MOUNJARO) 12.5 MG/0.5ML Pen; Inject 12.5 mg into the skin once a week on Thursday.  Dispense: 6 mL; Refill: 0  4. BMI 30.0-30.9,adult -- Current BMI 30.0  Pamela Love is currently in the action stage of change. As such, her goal is to continue with weight loss efforts. She has agreed to the Category 2 Plan.   Exercise goals: Older adults should follow the  adult guidelines. When older adults cannot meet the adult guidelines, they should be as physically active as their abilities and conditions will allow.  Older adults should do exercises that maintain or improve balance if they are at risk of falling.  Older adults should determine their level of effort for physical activity relative to their level of fitness.  Older adults with chronic conditions should understand whether and how their conditions affect their ability to do regular physical activity safely.  Behavioral modification strategies: increasing lean protein intake, decreasing simple carbohydrates, increasing  vegetables, increasing water intake, meal planning and cooking strategies, keeping healthy foods in the home, ways to avoid boredom eating, and planning for success.  Pamela Love has agreed to follow-up with our clinic in 4 weeks. She was informed of the importance of frequent follow-up visits to maximize her success with intensive lifestyle modifications for her multiple health conditions.   Objective:   Blood pressure 133/79, pulse 69, temperature 98 F (36.7 C), height 5\' 3"  (1.6 m), weight 169 lb (76.7 kg), SpO2 94%. Body mass index is 29.94 kg/m.  General: Cooperative, alert, well developed, in no acute distress. HEENT: Conjunctivae and lids unremarkable. Cardiovascular: Regular rhythm.  Lungs: Normal work of breathing. Neurologic: No focal deficits.   Lab Results  Component Value Date   CREATININE 1.0 07/20/2023   BUN 34 (A) 07/20/2023   NA 139 07/20/2023   K 4.2 07/20/2023   CL 107 07/20/2023   CO2 27 (A) 07/20/2023   Lab Results  Component Value Date   ALT 27 07/20/2023   AST 21 07/20/2023   ALKPHOS 117 07/20/2023   BILITOT 0.5 12/19/2022   Lab Results  Component Value Date   HGBA1C 5.5 06/19/2023   HGBA1C 5.5 03/14/2023   HGBA1C 5.7 (H) 12/19/2022   HGBA1C 5.5 08/30/2022   HGBA1C 6.0 (H) 06/06/2022   No results found for: "INSULIN" Lab Results  Component Value Date   TSH 1.71 05/30/2018   Lab Results  Component Value Date   CHOL 143 07/20/2023   HDL 93 (A) 07/20/2023   LDLCALC 75 07/20/2023   TRIG 97 07/20/2023   CHOLHDL 2.9 12/19/2022   Lab Results  Component Value Date   VD25OH 61.9 06/19/2023   VD25OH 77.1 12/19/2022   VD25OH 64.2 06/06/2022   Lab Results  Component Value Date   WBC 7.5 11/13/2021   HGB 14.0 11/13/2021   HCT 41.3 11/13/2021   MCV 93.0 11/13/2021   PLT 243 11/13/2021   No results found for: "IRON", "TIBC", "FERRITIN"  Attestation Statements:   Reviewed by clinician on day of visit: allergies, medications, problem list,  medical history, surgical history, family history, social history, and previous encounter notes.  I have reviewed the above documentation for accuracy and completeness, and I agree with the above. -  Angelika Jerrett d. Laiya Wisby, NP-C

## 2023-08-29 ENCOUNTER — Other Ambulatory Visit (HOSPITAL_BASED_OUTPATIENT_CLINIC_OR_DEPARTMENT_OTHER): Payer: Self-pay | Admitting: Cardiology

## 2023-08-29 DIAGNOSIS — I2510 Atherosclerotic heart disease of native coronary artery without angina pectoris: Secondary | ICD-10-CM

## 2023-09-10 ENCOUNTER — Other Ambulatory Visit (HOSPITAL_BASED_OUTPATIENT_CLINIC_OR_DEPARTMENT_OTHER): Payer: Self-pay | Admitting: Cardiology

## 2023-09-10 DIAGNOSIS — I25118 Atherosclerotic heart disease of native coronary artery with other forms of angina pectoris: Secondary | ICD-10-CM

## 2023-09-10 DIAGNOSIS — E782 Mixed hyperlipidemia: Secondary | ICD-10-CM

## 2023-09-11 ENCOUNTER — Ambulatory Visit (INDEPENDENT_AMBULATORY_CARE_PROVIDER_SITE_OTHER): Payer: Medicare PPO | Admitting: Family Medicine

## 2023-09-11 ENCOUNTER — Other Ambulatory Visit: Payer: Self-pay | Admitting: Cardiology

## 2023-09-11 DIAGNOSIS — I2510 Atherosclerotic heart disease of native coronary artery without angina pectoris: Secondary | ICD-10-CM

## 2023-09-12 ENCOUNTER — Ambulatory Visit (INDEPENDENT_AMBULATORY_CARE_PROVIDER_SITE_OTHER): Payer: Medicare PPO | Admitting: Family Medicine

## 2023-09-12 ENCOUNTER — Encounter (INDEPENDENT_AMBULATORY_CARE_PROVIDER_SITE_OTHER): Payer: Self-pay | Admitting: Family Medicine

## 2023-09-12 VITALS — BP 164/70 | HR 69 | Temp 97.6°F | Ht 63.0 in | Wt 171.0 lb

## 2023-09-12 DIAGNOSIS — Z6830 Body mass index (BMI) 30.0-30.9, adult: Secondary | ICD-10-CM

## 2023-09-12 DIAGNOSIS — Z7985 Long-term (current) use of injectable non-insulin antidiabetic drugs: Secondary | ICD-10-CM

## 2023-09-12 DIAGNOSIS — I152 Hypertension secondary to endocrine disorders: Secondary | ICD-10-CM

## 2023-09-12 DIAGNOSIS — E669 Obesity, unspecified: Secondary | ICD-10-CM | POA: Diagnosis not present

## 2023-09-12 DIAGNOSIS — E1159 Type 2 diabetes mellitus with other circulatory complications: Secondary | ICD-10-CM | POA: Diagnosis not present

## 2023-09-12 DIAGNOSIS — E1169 Type 2 diabetes mellitus with other specified complication: Secondary | ICD-10-CM | POA: Diagnosis not present

## 2023-09-12 DIAGNOSIS — E559 Vitamin D deficiency, unspecified: Secondary | ICD-10-CM

## 2023-09-12 NOTE — Progress Notes (Signed)
Pamela Love, D.O.  ABFM, ABOM Specializing in Clinical Bariatric Medicine  Office located at: [address] [place], [place]  [address]   Assessment and Plan:   FOR THE DISEASE OF OBESITY: BMI 30.0-30.9,adult -- Current BMI 30.3 Obesity - Starting BMI 40.22 date 03/17/20 Assessment & Plan: Since last office visit with William Hamburger NP on 08/28/23 patient's muscle mass has decreased by 1.6 lb. Fat mass has increased by 4 lb.  Counseling done on how various foods will affect these numbers and how to maximize success  Total lbs lost to date: 56 lbs Total weight loss percentage to date: -24.67%    Recommended Dietary Goals Pamela Love is currently in the action stage of change. As such, her goal is to continue weight management plan.  She has agreed to: continue current plan   Behavioral Intervention We discussed the following today: increasing lean protein intake to established goals, decreasing simple carbohydrates , increasing water intake , avoiding temptations and identifying enticing environmental cues, celebration eating strategies, continue to work on maintaining a reduced calorie state, getting the recommended amount of protein, incorporating whole foods, making healthy choices, staying well hydrated and practicing mindfulness when eating., and using GPT or another AI platform for recipe ideas- searching "low calorie, low carb, high protein chicken recipes" etc  Additional resources provided today: Personalized instruction on the use of artificial intelligence for recipes, calorie tracking, and finding healthier options when eating out.   Evidence-based interventions for health behavior change were utilized today including the discussion of self monitoring techniques, problem-solving barriers and SMART goal setting techniques.   Regarding patient's less desirable eating habits and patterns, we employed the technique of small changes.   Pt will specifically work on: limit  off-plan eating when attending social gatherings/outings for next visit.    Recommended Physical Activity Goals Pamela Love has been advised to work up to 150 minutes of moderate intensity aerobic activity a week and strengthening exercises 2-3 times per week for cardiovascular health, weight loss maintenance and preservation of muscle mass.   She has agreed to :  Continue current level of physical activity , Think about enjoyable ways to increase daily physical activity and overcoming barriers to exercise, Increase physical activity in their day and reduce sedentary time (increase NEAT)., and continue to gradually increase the amount and intensity of exercise routine   Pharmacotherapy We both agreed to : continue with nutritional and behavioral strategies and adequate clinical response to current dose, continue current regimen   FOR ASSOCIATED CONDITIONS ADDRESSED TODAY: Hypertension associated with type 2 diabetes mellitus (HCC) Assessment & Plan: BP Readings from Last 3 Encounters:  09/12/23 (!) 164/70  08/28/23 133/79  08/14/23 (!) 150/86   BP is elevated at 164/70. Her BP has been at goal in past office visits. She has been monitoring her BP at home and brought her log with her. Blood pressures at home are at goal. Pt is on Carvedilol 3.125 mg twice daily managed by cardiology. Tolerating well, no SE.   Pt to continue monitoring her BP at home. Reviewed goal BP readings of 140/90 or less, pt verbalized understanding. Continue with current antihypertensive regimen and follow up with cardiology as directed by them. Will continue to monitor condition.    Vitamin D deficiency Assessment & Plan: Lab Results  Component Value Date   VD25OH 61.9 06/19/2023   VD25OH 77.1 12/19/2022   VD25OH 64.2 06/06/2022   Latest vitamin D was at goal at 61.9. Pt is  on Cholecalciferol 2000 units once daily with good compliance and tolerance. No acute concerns reported today.   Continue with current  supplementation regimen as prescribed. No changes made today. Will continue to monitor condition.    Type 2 diabetes mellitus with obesity [facility]) Assessment & Plan: Lab Results  Component Value Date   HGBA1C 5.5 06/19/2023   HGBA1C 5.5 03/14/2023   HGBA1C 5.7 (H) 12/19/2022    Last A1C was 5.5. Pt is compliant with Mounjaro 12.5 mg once weekly. Tolerating well with no adverse side effects. Pt has been monitoring her sugars at home. Reports fasting sugars of 124 and 130. Postprandial readings in the 130s. Lowest reading was 91. Pt reports off plan eating while going out for social gatherings.   Continue with current medication regimen as prescribed. Advised pt to continue to monitor her sugars, fasting and postprandial. Reviewed celebration and social gathering eating strategies with pt. Encouraged pt to increase her water intake to at least half her body weight in ounces of water. Will continue to monitor her condition as it relates to her weight loss journey.    Follow up:   Return in about 13 days (around 09/25/2023). She was informed of the importance of frequent follow up visits to maximize her success with intensive lifestyle modifications for her multiple health conditions.  Subjective:   Chief complaint: Obesity Pamela Love is here to discuss her progress with her obesity treatment plan. She is on the Category 2 Plan and states she is following her eating plan approximately 60% of the time. She states she is dancing 60 minutes 1 day per week and cardio/strength training 75 minutes 2 days per week.   Interval History:  Pamela Love is here for a follow up office visit. Since last OV with William Hamburger NP on 08/28/23, she is up 2 lbs. She is doing well overall. Reports some off-plan eating socially. Pt reports she is in charge of organizing different social gatherings and outings which often have off-plan foods.   Pharmacotherapy for weight loss: She is currently taking Monjauro with  diabetes as the primary indication with adequate clinical response  and without side effects..   Review of Systems:  Pertinent positives were addressed with patient today.  Reviewed by clinician on day of visit: allergies, medications, problem list, medical history, surgical history, family history, social history, and previous encounter notes.  Weight Summary and Biometrics   Weight Lost Since Last Visit: 0  Weight Gained Since Last Visit: 2    Vitals Temp: 97.6 F (36.4 C) BP: (!) 164/70 Pulse Rate: 69 SpO2: 100 %   Anthropometric Measurements Height: 5\' 3"  (1.6 m) Weight: 171 lb (77.6 kg) BMI (Calculated): 30.3 Weight at Last Visit: 169 lb Weight Lost Since Last Visit: 0 Weight Gained Since Last Visit: 2 Starting Weight: 227 lb Total Weight Loss (lbs): 56 lb (25.4 kg) Peak Weight: 250 lb   Body Composition  Body Fat %: 44.9 % Fat Mass (lbs): 77.2 lbs Muscle Mass (lbs): 89.8 lbs Visceral Fat Rating : 13   Other Clinical Data Fasting: no Labs: no Today's Visit #: 74 Starting Date: 03/17/20    Objective:   PHYSICAL EXAM: Blood pressure (!) 164/70, pulse 69, temperature 97.6 F (36.4 C), height 5\' 3"  (1.6 m), weight 171 lb (77.6 kg), SpO2 100%. Body mass index is 30.29 kg/m.  General: she is overweight, cooperative and in no acute distress. PSYCH: Has normal mood, affect and thought process.   HEENT: EOMI, sclerae are  anicteric. Lungs: Normal breathing effort, no conversational dyspnea. Extremities: Moves * 4 Neurologic: A and O * 3, good insight  DIAGNOSTIC DATA REVIEWED: BMET    Component Value Date/Time   NA 139 07/20/2023 0000   K 4.2 07/20/2023 0000   CL 107 07/20/2023 0000   CO2 27 (A) 07/20/2023 0000   GLUCOSE 88 07/18/2023 1357   GLUCOSE 92 03/14/2023 1413   BUN 34 (A) 07/20/2023 0000   CREATININE 1.0 07/20/2023 0000   CREATININE 1.03 (H) 07/18/2023 1357   CALCIUM 9.8 07/20/2023 0000   GFRNONAA 54 (L) 11/13/2021 1933   GFRAA 54 (L)  05/12/2020 1557   Lab Results  Component Value Date   HGBA1C 5.5 06/19/2023   HGBA1C 6 1 02/09/2015   No results found for: "INSULIN" Lab Results  Component Value Date   TSH 1.71 05/30/2018   CBC    Component Value Date/Time   WBC 7.5 11/13/2021 1933   RBC 4.44 11/13/2021 1933   HGB 14.0 11/13/2021 1933   HGB 13.8 01/26/2021 1427   HCT 41.3 11/13/2021 1933   HCT 41.6 01/26/2021 1427   PLT 243 11/13/2021 1933   PLT 229 01/26/2021 1427   MCV 93.0 11/13/2021 1933   MCV 92 01/26/2021 1427   MCH 31.5 11/13/2021 1933   MCHC 33.9 11/13/2021 1933   RDW 12.1 11/13/2021 1933   RDW 13.2 01/26/2021 1427   Iron Studies No results found for: "IRON", "TIBC", "FERRITIN", "IRONPCTSAT" Lipid Panel     Component Value Date/Time   CHOL 143 07/20/2023 0000   CHOL 133 12/19/2022 1435   TRIG 97 07/20/2023 0000   HDL 93 (A) 07/20/2023 0000   HDL 46 12/19/2022 1435   CHOLHDL 2.9 12/19/2022 1435   LDLCALC 75 07/20/2023 0000   LDLCALC 67 12/19/2022 1435   Hepatic Function Panel     Component Value Date/Time   PROT 6.7 12/19/2022 1435   ALBUMIN 4.3 07/20/2023 0000   ALBUMIN 4.4 12/19/2022 1435   AST 21 07/20/2023 0000   ALT 27 07/20/2023 0000   ALKPHOS 117 07/20/2023 0000   BILITOT 0.5 12/19/2022 1435      Component Value Date/Time   TSH 1.71 05/30/2018 0000   Nutritional Lab Results  Component Value Date   VD25OH 61.9 06/19/2023   VD25OH 77.1 12/19/2022   VD25OH 64.2 06/06/2022    Attestations:   I, Isabelle Course, acting as a Stage manager for [name], DO., have compiled all relevant documentation for today's office visit on behalf of [name], DO, while in the presence of [employer], DO.  Reviewed by clinician on day of visit: allergies, medications, problem list, medical history, surgical history, family history, social history, and previous encounter notes pertinent to patient's obesity diagnosis.  I have reviewed the above documentation for  accuracy and completeness, and I agree with the above. Pamela Love, D.O.  The 21st Century Cures Act was signed into law in 2016 which includes the topic of electronic health records.  This provides immediate access to information in MyChart.  This includes consultation notes, operative notes, office notes, lab results and pathology reports.  If you have any questions about what you read please let us know at your next visit so we can discuss your concerns and take corrective action if need be.  We are right here with you.

## 2023-09-14 ENCOUNTER — Encounter: Payer: Self-pay | Admitting: Internal Medicine

## 2023-09-14 ENCOUNTER — Ambulatory Visit: Payer: Medicare PPO | Admitting: Internal Medicine

## 2023-09-14 VITALS — BP 120/60 | HR 76 | Ht 63.0 in | Wt 174.2 lb

## 2023-09-14 DIAGNOSIS — Z7985 Long-term (current) use of injectable non-insulin antidiabetic drugs: Secondary | ICD-10-CM | POA: Diagnosis not present

## 2023-09-14 DIAGNOSIS — E1122 Type 2 diabetes mellitus with diabetic chronic kidney disease: Secondary | ICD-10-CM

## 2023-09-14 DIAGNOSIS — N1831 Chronic kidney disease, stage 3a: Secondary | ICD-10-CM

## 2023-09-14 DIAGNOSIS — E782 Mixed hyperlipidemia: Secondary | ICD-10-CM

## 2023-09-14 LAB — POCT GLYCOSYLATED HEMOGLOBIN (HGB A1C): Hemoglobin A1C: 5.5 % (ref 4.0–5.6)

## 2023-09-14 NOTE — Patient Instructions (Signed)
Please continue: - Mounjaro 12.5 mg weekly   Please return in 6 months with your sugar log.

## 2023-09-14 NOTE — Addendum Note (Signed)
Addended by: Pollie Meyer on: 09/14/2023 01:57 PM   Modules accepted: Orders

## 2023-09-14 NOTE — Progress Notes (Signed)
Patient ID: Pamela Love, female   DOB: 02-11-1951, 73 y.o.   MRN: [id number]  HPI: Pamela Love is a 73 y.o.-year-old female, returning for follow-up for DM2, dx in 2012, insulin-dependent, uncontrolled, with complications (CAD - s/p 2 stents, stage 3 CKD). Pt. previously saw Dr. Everardo All, but last visit with me 6 months ago.  Interim history: No increased urination, blurry vision, nausea, chest pain. She lost ~60 lbs since she started to see the [facility] approximately 3 years ago. She recently dropped a curling iron on her  R foot-small burn: healing.  Reviewed HbA1c: Lab Results  Component Value Date   HGBA1C 5.5 06/19/2023   HGBA1C 5.5 03/14/2023   HGBA1C 5.7 (H) 12/19/2022   HGBA1C 5.5 08/30/2022   HGBA1C 6.0 (H) 06/06/2022   HGBA1C 6.1 (A) 02/25/2022   HGBA1C 5.8 (A) 10/14/2021   HGBA1C 6.1 (A) 07/12/2021   HGBA1C 5.8 (A) 04/12/2021   HGBA1C 7.0 (A) 12/10/2020   Pt is on a regimen of: - Mounjaro 7.5 >> 10 >> 12.5 >> 10 >> 12.5 mg weekly - [facility] She previously tried Victoza and Ozempic. She tried Trulicity and Ozempic >> nausea She tried [place] >> vaginal yeast infections She tried repaglinide >> diarrhea She was on Toujeo initially 16 units, but came off in 04/2023. Reviewing Dr. George Hugh last note, she declined multiple daily injections in the past.  Pt checks her sugars 1x a day and they are: - am: 152-170 >> 101-150, 154, 169 >> 106-141, 151 >> 124-146 - 2h after b'fast: n/c - before lunch: 110 >> 88, 90, 128 >> 98-115 >> 91-122 - 2h after lunch: n/c - before dinner: 126 >> 109, 120 >> 75-103 >> n/c - 2h after dinner: n/c >> 93 >> n/c >> 130 - bedtime: 87-114 >> 74-119 >> 84-119 >> 81- 100 - nighttime: n/c Lowest sugar was 87 >> 74 >> 75>> 79; she has hypoglycemia awareness at 70.  Highest sugar was 180 >> 169 >> 151 >> 146  Glucometer:Accu Chek  - + CKD, last BUN/creatinine:  Lab  Results  Component Value Date   BUN 34 (A) 07/20/2023   BUN 38 (H) 07/18/2023   CREATININE 1.0 07/20/2023   CREATININE 1.03 (H) 07/18/2023   Lab Results  Component Value Date   MICRALBCREAT 10 04/21/2023  She is not on ACE inhibitor/ARB.  -+ HL; last set of lipids: Lab Results  Component Value Date   CHOL 143 07/20/2023   HDL 93 (A) 07/20/2023   LDLCALC 75 07/20/2023   TRIG 97 07/20/2023   CHOLHDL 2.9 12/19/2022  She is on Crestor 40 mg daily.  - last eye exam was in 09/2022. No DR reportedly. Dr. Randon Goldsmith.  - no numbness and tingling in her feet.  Last foot exam 08/30/2022.  She also has a history of GERD, OSA, OA, osteoporosis, obesity.  She lost 28-30 lbs since 2021 - [facility].  She adapted well to the diet, despite the fact that she describes having a sweet tooth.  ROS: + see HPI  Past Medical History:  Diagnosis Date   Abnormal nuclear stress test 01/02/2020   Arthritis    Bilateral leg edema 01/02/2020   Bursitis    hips   C. difficile colitis 12/2017   CAD (coronary artery disease) 01/06/2020   CKD (chronic kidney disease), stage III (HCC)    Complication of anesthesia    "spinal did not work with  second c section"   Diabetes mellitus (HCC) 11/19/2015   Diabetes mellitus, type 2 (HCC)    diet controlled - has Rx for Glymipride but has not started taking yet   Dyslipidemia 10/21/2014   GERD (gastroesophageal reflux disease)    History of nonmelanoma skin cancer    History of skin cancer    Hypercholesterolemia 12/11/2019   Hyperlipidemia    Hypertension    Hypertensive disorder 10/21/2014   Lower extremity edema    Mixed hyperlipidemia 01/02/2020   OAB (overactive bladder)    Obesity (BMI 30-39.9) 05/12/2020   OSA (obstructive sleep apnea) 05/12/2020   Osteoarthritis of right knee 05/15/2015   Osteoarthrosis, unspecified whether generalized or localized, involving lower leg 10/21/2014   Osteoporosis    Over weight    Primary  osteoarthritis of left knee 12/18/2014   S/P knee replacement 12/18/2014   S/P total knee replacement using cement 05/15/2015   Sleep apnea    Tachycardia 12/11/2019   Past Surgical History:  Procedure Laterality Date   ABDOMINAL HYSTERECTOMY  2005   CATARACT EXTRACTION     CESAREAN SECTION     x2   colonscopy      CORONARY PRESSURE/FFR STUDY N/A 01/06/2020   Procedure: INTRAVASCULAR PRESSURE WIRE/FFR STUDY;  Surgeon: Runell Gess, MD;  Location: [facility];  Service: Cardiovascular;  Laterality: N/A;   CORONARY STENT INTERVENTION N/A 01/06/2020   Procedure: CORONARY STENT INTERVENTION;  Surgeon: Runell Gess, MD;  Location: [facility];  Service: Cardiovascular;  Laterality: N/A;   EYE SURGERY     bilat cataract surgery 2015   LEFT HEART CATH AND CORONARY ANGIOGRAPHY N/A 01/06/2020   Procedure: LEFT HEART CATH AND CORONARY ANGIOGRAPHY;  Surgeon: Runell Gess, MD;  Location: [facility];  Service: Cardiovascular;  Laterality: N/A;   LEFT HEART CATH AND CORONARY ANGIOGRAPHY N/A 01/29/2021   Procedure: LEFT HEART CATH AND CORONARY ANGIOGRAPHY;  Surgeon: Corky Crafts, MD;  Location: [facility] INV[facility]IVE CV [facility];  Service: Cardiovascular;  Laterality: N/A;   TOTAL KNEE ARTHROPLASTY Left 12/18/2014   Procedure: TOTAL LEFT KNEE ARTHROPLASTY;  Surgeon: Eugenia Mcalpine, MD;  Location: [facility];  Service: Orthopedics;  Laterality: Left;   TOTAL KNEE ARTHROPLASTY Right 05/15/2015   Procedure: RIGHT TOTAL KNEE ARTHROPLASTY;  Surgeon: Eugenia Mcalpine, MD;  Location: [facility];  Service: Orthopedics;  Laterality: Right;   UTERINE FIBROID SURGERY     Social History   Socioeconomic History   Marital status: Married    Spouse name: Not on file   Number of children: Not on file   Years of education: Not on file   Highest education level: Not on file  Occupational History   Occupation: retired  Tobacco Use   Smoking status: Never   Smokeless tobacco: Never  Vaping Use    Vaping status: Never Used  Substance and Sexual Activity   Alcohol use: Yes    Comment: rare   Drug use: No   Sexual activity: Not Currently    Partners: Male  Other Topics Concern   Not on file  Social History Narrative   Not on file   Social Drivers of Health   Financial Resource Strain: Not on file  Food Insecurity: Not on file  Transportation Needs: Not on file  Physical Activity: Not on file  Stress: Not on file  Social Connections: Not on file  Intimate Partner Violence: Not on file   Current Outpatient Medications on File Prior to Visit  Medication Sig Dispense Refill   Accu-Chek Softclix Lancets lancets USE TO CHECK BLOOD SUGAR TWICE DAILY 200 each 3   acetaminophen (TYLENOL) 650 MG CR tablet Take 1,300 mg by mouth at bedtime.     aspirin EC 81 MG tablet Take 1 tablet (81 mg total) by mouth in the morning.     Blood Glucose Monitoring Suppl (ACCU-CHEK GUIDE ME) w/Device KIT 1 each by Does not apply route 2 (two) times daily. E11.9 1 kit 0   carvedilol (COREG) 3.125 MG tablet TAKE 1 TABLET BY MOUTH TWICE A DAY WITH A MEAL 180 tablet 2   Cholecalciferol (VITAMIN D) 50 MCG (2000 UT) CAPS Take 1 capsule (2,000 Units total) by mouth every morning.     clopidogrel (PLAVIX) 75 MG tablet TAKE 1 TABLET BY MOUTH EVERY DAY WITH BREAKFAST 90 tablet 2   Coenzyme Q10 (COQ10) 200 MG CAPS Take 200 mg by mouth in the morning.     DULoxetine (CYMBALTA) 20 MG capsule Take 40 mg by mouth at bedtime.      glucose blood (ACCU-CHEK GUIDE) test strip Use to check BS 2x a day 200 each 12   levocetirizine (XYZAL) 5 MG tablet Take 1 tablet (5 mg total) by mouth every evening. 30 tablet 0   Multiple Vitamin (MULTIVITAMIN WITH MINERALS) TABS tablet Take 1 tablet by mouth every morning.      nitroGLYCERIN (NITROSTAT) 0.4 MG SL tablet PLACE 1 TABLET (0.4 MG TOTAL) UNDER THE TONGUE EVERY 5 (FIVE) MINUTES X 3 DOSES AS NEEDED FOR CHEST PAIN. 75 tablet 1   Omega-3 Fatty Acids (RA FISH OIL) 1400 MG CPDR  Take 1,400 mg by mouth 2 (two) times daily.     pantoprazole (PROTONIX) 40 MG tablet Take 40 mg by mouth daily.     ranolazine (RANEXA) 1000 MG SR tablet TAKE 1 TABLET BY MOUTH TWICE A DAY 180 tablet 2   rosuvastatin (CRESTOR) 40 MG tablet TAKE 1 TABLET (40 MG TOTAL) BY MOUTH IN THE MORNING 90 tablet 3   saccharomyces boulardii (FLORASTOR) 250 MG capsule Take 250 mg by mouth in the morning.     tirzepatide (MOUNJARO) 10 MG/0.5ML Pen Inject 10 mg into the skin once a week. 6 mL 5   tirzepatide (MOUNJARO) 12.5 MG/0.5ML Pen Inject 12.5 mg into the skin once a week on Thursday. 6 mL 0   trospium (SANCTURA) 20 MG tablet Take 20 mg by mouth 2 (two) times daily.  3   vitamin C (ASCORBIC ACID) 250 MG tablet Take 250 mg by mouth in the morning.     No current facility-administered medications on file prior to visit.   Allergies  Allergen Reactions   Amoxicillin-Pot Clavulanate     Other reaction(s): vomiting   Aspirin     Other reaction(s): reflux and nervousness   Colchicine     Stomach cramps Other reaction(s): severe GI upset   Diclofenac     Oral causes stomach cramps   Dulaglutide Diarrhea and Nausea And Vomiting    Other reaction(s): diarrhea   Empagliflozin     Other reaction(s): yeast infection   Ezetimibe     Muscle pain Other reaction(s): muscle aches   Imdur [Isosorbide Nitrate] Other (See Comments)    hypotensive   Isosorbide Other (See Comments)    Causes Hypotension   Metformin     Other reaction(s): stomach upset   Repaglinide     Other reaction(s): swelling and GI upset   Sitagliptin-Metformin Hcl Nausea And Vomiting  Lightheaded  Other reaction(s): GI upset   Sulfamethoxazole-Trimethoprim Itching    Other reaction(s): itching   Imipramine Rash   Tape Rash    Paper tape ok to use    Family History  Problem Relation Age of Onset   Diabetes Maternal Grandfather    Heart attack Maternal Grandfather    Heart disease Mother    Hypertension Mother     Hyperlipidemia Mother    Cancer Mother    Heart attack Father    Hypertension Father    Hyperlipidemia Father    Heart disease Father    Sudden death Father    Sleep apnea Father    Heart attack Sister    Heart attack Paternal Uncle    Congenital heart disease Son    PE: BP 120/60   Pulse 76   Ht 5\' 3"  (1.6 m)   Wt 174 lb 3.2 oz (79 kg)   SpO2 98%   BMI 30.86 kg/m  Wt Readings from Last 20 Encounters:  09/14/23 174 lb 3.2 oz (79 kg)  09/12/23 171 lb (77.6 kg)  08/28/23 169 lb (76.7 kg)  08/14/23 170 lb (77.1 kg)  07/18/23 167 lb (75.8 kg)  07/18/23 170 lb (77.1 kg)  07/03/23 168 lb (76.2 kg)  06/19/23 168 lb (76.2 kg)  06/05/23 167 lb (75.8 kg)  05/23/23 167 lb (75.8 kg)  04/24/23 170 lb (77.1 kg)  04/05/23 171 lb (77.6 kg)  03/16/23 168 lb (76.2 kg)  03/14/23 173 lb 6.4 oz (78.7 kg)  03/02/23 170 lb (77.1 kg)  02/14/23 171 lb (77.6 kg)  01/23/23 172 lb (78 kg)  01/09/23 172 lb (78 kg)  01/06/23 178 lb 6.4 oz (80.9 kg)  12/19/22 171 lb (77.6 kg)   Constitutional: overweight, in NAD Eyes: no exophthalmos ENT: no thyromegaly, no cervical lymphadenopathy Cardiovascular: RRR, No MRG Respiratory: CTA B Musculoskeletal: no deformities Skin: no rashes Neurological: no tremor with outstretched hands Diabetic Foot Exam - Simple   Simple Foot Form Diabetic Foot exam was performed with the following findings: Yes 09/14/2023  1:32 PM  Visual Inspection No deformities, no ulcerations, no other skin breakdown bilaterally: Yes Sensation Testing Intact to touch and monofilament testing bilaterally: Yes Pulse Check Posterior Tibialis and Dorsalis pulse intact bilaterally: Yes Comments    ASSESSMENT: 1. DM2, non-insulin-dependent, uncontrolled, without long-term complications - CAD - s/p 2 stents - stage 3 CKD  Component     Latest Ref Rng 03/14/2023  Hemoglobin A1C     4.0 - 5.6 % 5.5   Glucose     65 - 99 mg/dL 92   C-Peptide     8.65 - 3.85 ng/mL 5.79 (H)    IA-2 Antibody     <5.4 U/mL <5.4   ZNT8 Antibodies     <15 U/mL <10   Glutamic Acid Decarb Ab     <5 IU/mL <5   No signs of insulin deficiency or positive pancreatic autoimmunity.    2. HL  3.  Obesity class II  PLAN:  1. Patient with well-controlled type 2 diabetes, on injectable antidiabetic regimen with GLP-1/GIP receptor agonist only.  At last visit, sugars were slightly higher than goal in the morning but they were at goal later in the day.  She was taking a very low-dose of insulin, 6 units daily.  After checking her insulin production and antipancreatic antibodies, we ended up stopping insulin at last visit.  She continues to see Dr. Dalbert Garnet in the weight management clinic. -Last  visit, HbA1c was normal, at 5.5% and it remained stable at last check 3 months ago, off insulin. -At today's visit, sugars are at goal for slightly above target in the morning, however, they are well-controlled later in the day with the best control after dinner.  She continues on the same dose of Mounjaro, 12.5 mg weekly, tolerated well.  Her weight has stagnated since last visit, possibly due to the holidays.  No need to change the regimen for now from the diabetes point of view. - I suggested to:  Patient Instructions  Please continue: - Mounjaro 12.5 mg weekly   Please return in 6 months with your sugar log.   - we checked her HbA1c: 5.5% (stable, great) - advised to check sugars at different times of the day - 1x a day, rotating check times - advised for yearly eye exams >> she is UTD - return to clinic in 6  months  2. HL -Reviewed latest lipid panel from 07/2023: Fractions at goal with the exception of an LDL above our target of less than 55 due to cardiovascular disease: Lab Results  Component Value Date   CHOL 143 07/20/2023   HDL 93 (A) 07/20/2023   LDLCALC 75 07/20/2023   TRIG 97 07/20/2023   CHOLHDL 2.9 12/19/2022  -She is on Crestor 40 mg daily without side effects  3.  Obesity  class II -Continues on Mounjaro which should also help with weight loss -At last visit she was wondering about goal weight and I recommended a BMI of 25 or lower. -Before the last 3 visits combined, she lost approximately 60 pounds -At today's visit, weight is approximately stable -She continues to go to the weight management clinic  Carlus Pavlov, MD PhD [facility]

## 2023-09-22 ENCOUNTER — Other Ambulatory Visit (HOSPITAL_BASED_OUTPATIENT_CLINIC_OR_DEPARTMENT_OTHER): Payer: Self-pay

## 2023-09-25 ENCOUNTER — Encounter (INDEPENDENT_AMBULATORY_CARE_PROVIDER_SITE_OTHER): Payer: Self-pay | Admitting: Family Medicine

## 2023-09-25 ENCOUNTER — Ambulatory Visit (INDEPENDENT_AMBULATORY_CARE_PROVIDER_SITE_OTHER): Payer: Medicare PPO | Admitting: Family Medicine

## 2023-09-25 VITALS — BP 120/72 | HR 79 | Temp 98.6°F | Ht 63.0 in | Wt 171.0 lb

## 2023-09-25 DIAGNOSIS — I152 Hypertension secondary to endocrine disorders: Secondary | ICD-10-CM

## 2023-09-25 DIAGNOSIS — E1159 Type 2 diabetes mellitus with other circulatory complications: Secondary | ICD-10-CM

## 2023-09-25 DIAGNOSIS — Z6830 Body mass index (BMI) 30.0-30.9, adult: Secondary | ICD-10-CM

## 2023-09-25 DIAGNOSIS — E669 Obesity, unspecified: Secondary | ICD-10-CM | POA: Diagnosis not present

## 2023-09-25 DIAGNOSIS — E1169 Type 2 diabetes mellitus with other specified complication: Secondary | ICD-10-CM | POA: Diagnosis not present

## 2023-09-25 DIAGNOSIS — I25118 Atherosclerotic heart disease of native coronary artery with other forms of angina pectoris: Secondary | ICD-10-CM | POA: Diagnosis not present

## 2023-09-25 DIAGNOSIS — Z7985 Long-term (current) use of injectable non-insulin antidiabetic drugs: Secondary | ICD-10-CM

## 2023-09-25 NOTE — Progress Notes (Signed)
 Pamela Love, D.O.  ABFM, ABOM Specializing in Clinical Bariatric Medicine  Office located at: [address] [place], [place]  [address]   Assessment and Plan:   FOR THE DISEASE OF OBESITY: BMI 30.0-30.9,adult -- Current BMI 30.3 Obesity - Starting BMI 40.22 date 03/17/20 Assessment & Plan: Since last office visit on 09/12/2023 patient's  Muscle mass has increased by 1.8 lbs. Fat mass has decreased by 2.8 lbs. Total body water has not changed.  Counseling done on how various foods will affect these numbers and how to maximize success  Total lbs lost to date: 56 lbs Total weight loss percentage to date: 24.67%    Recommended Dietary Goals Pamela Love is currently in the action stage of change. As such, her goal is to continue weight management plan.  She has agreed to: switch to CAT 1 MP with B/L options d/t pt admitting she feels "too full" abiding by CAT 2 MP   Behavioral Intervention We discussed the following today: [facility] [place], Walk At Home Series, Dividing lean protein amounts throughout the day{[email or web address]:[id number]}  Additional resources provided today: handout on CAT 1 meal plan , Handout on CAT 1-2 breakfast options, and Handout on CAT 1-2 lunch options  Evidence-based interventions for health behavior change were utilized today including the discussion of self monitoring techniques, problem-solving barriers and SMART goal setting techniques.   Regarding patient's less desirable eating habits and patterns, we employed the technique of small changes.   Pt will specifically work on: *** for next visit.    Recommended Physical Activity Goals Halayna has been advised to work up to 150 minutes of moderate intensity aerobic activity a week and strengthening exercises 2-3 times per week for cardiovascular health, weight loss maintenance and preservation of muscle mass.   She has agreed to : Increase cardio to 3 days a week     Pharmacotherapy We both agreed to  : continue with nutritional and behavioral strategies and maintain current medication regimen   FOR ASSOCIATED CONDITIONS ADDRESSED TODAY:  Type 2 diabetes mellitus with obesity (HCC) Assessment & Plan: Pamela Love is managing T2DM with Mounjaro 12.5 mg weekly. Hunger/cravings well controlled. No concerns with medication. Since A1c has been stable at 5.5, she will stay at the current Mounjaro dose per pt's endocrinologist. Also, pt has been checking blood sugars at home, blood sugars have been stable. Blood sugars range from 111-119. Pt has been asymptomatic. Will switch to Category 1 MP. Continue to follow current medication regimen. Will continue to monitor condition.   Hypertension associated with type 2 diabetes mellitus (HCC) Assessment & Plan: Condition treated with Carvedilol 3.125 mg daily, managed by cardiology. Pt's BP today is stable at 120/72. Pamela Love reports managing her BP at home. She denies any hypotension/hypertension. Will continue to monitor alongside specialist.   Coronary artery disease involving native coronary artery of native heart with other form of angina pectoris [name]) Assessment & Plan: [name] reports having a "heart episode" recently. She explains that she was preparing meals and started to feel SOB and heart palpitations. This lasted about 2 seconds and went away. Symptoms never returned and pt reports  Recommended pt talk to cardiologist to ensure there are no concerns in this regard.    Follow up:   Return in about 16 days (around 10/11/2023).*** She was informed of the importance of frequent follow up visits to maximize her success with intensive lifestyle modifications for her multiple health conditions.  Subjective:   Chief complaint: Obesity Pamela Love is here  to discuss her progress with her obesity treatment plan. She is on the the Category 2 Plan and states she is following her eating plan approximately 60% of the time. She states she is dancing 45 minutes 1 day a  week and doing cardio 75 minutes 2 days per week.  Interval History:  Pamela Love is here for a follow up office visit. Since last OV on 09/12/2023, Pamela Love's weight has not changed.  ***    Had some parties and valentines day stuff Not eating out a lot Hasn't been getting in correct amount of protein (gets too full)      Pharmacotherapy for weight loss: She is currently taking Mounjaro 12.5 mg weekly.   Review of Systems:  Pertinent positives were addressed with patient today.  Reviewed by clinician on day of visit: allergies, medications, problem list, medical history, surgical history, family history, social history, and previous encounter notes.  Weight Summary and Biometrics   Weight Lost Since Last Visit: 0 lb  Weight Gained Since Last Visit: 0 lb   Vitals Temp: 98.6 F (37 C) BP: 120/72 Pulse Rate: 79 SpO2: 98 %   Anthropometric Measurements Height: 5\' 3"  (1.6 m) Weight: 171 lb (77.6 kg) BMI (Calculated): 30.3 Weight at Last Visit: 171 lb Weight Lost Since Last Visit: 0 lb Weight Gained Since Last Visit: 0 lb Starting Weight: 227 lb Total Weight Loss (lbs): 56 lb (25.4 kg) Peak Weight: 250 lb   Body Composition  Body Fat %: 43.5 % Fat Mass (lbs): 74.4 lbs Muscle Mass (lbs): 91.6 lbs Total Body Water (lbs): 74.2 lbs Visceral Fat Rating : 13   Other Clinical Data Fasting: no Labs: no Today's Visit #: 7 Starting Date: 03/17/20    Objective:   PHYSICAL EXAM: Blood pressure 120/72, pulse 79, temperature 98.6 F (37 C), height 5\' 3"  (1.6 m), weight 171 lb (77.6 kg), SpO2 98%. Body mass index is 30.29 kg/m.  General: she is overweight, cooperative and in no acute distress. PSYCH: Has normal mood, affect and thought process.   HEENT: EOMI, sclerae are anicteric. Lungs: Normal breathing effort, no conversational dyspnea. Extremities: Moves * 4 Neurologic: A and O * 3, good insight  DIAGNOSTIC DATA REVIEWED: BMET    Component  Value Date/Time   NA 139 07/20/2023 0000   K 4.2 07/20/2023 0000   CL 107 07/20/2023 0000   CO2 27 (A) 07/20/2023 0000   GLUCOSE 88 07/18/2023 1357   GLUCOSE 92 03/14/2023 1413   BUN 34 (A) 07/20/2023 0000   CREATININE 1.0 07/20/2023 0000   CREATININE 1.03 (H) 07/18/2023 1357   CALCIUM 9.8 07/20/2023 0000   GFRNONAA 54 (L) 11/13/2021 1933   GFRAA 54 (L) 05/12/2020 1557   Lab Results  Component Value Date   HGBA1C 5.5 09/14/2023   HGBA1C 6 1 02/09/2015   No results found for: "INSULIN" Lab Results  Component Value Date   TSH 1.71 05/30/2018   CBC    Component Value Date/Time   WBC 7.5 11/13/2021 1933   RBC 4.44 11/13/2021 1933   HGB 14.0 11/13/2021 1933   HGB 13.8 01/26/2021 1427   HCT 41.3 11/13/2021 1933   HCT 41.6 01/26/2021 1427   PLT 243 11/13/2021 1933   PLT 229 01/26/2021 1427   MCV 93.0 11/13/2021 1933   MCV 92 01/26/2021 1427   MCH 31.5 11/13/2021 1933   MCHC 33.9 11/13/2021 1933   RDW 12.1 11/13/2021 1933   RDW 13.2 01/26/2021 1427   Iron Studies  No results found for: "IRON", "TIBC", "FERRITIN", "IRONPCTSAT" Lipid Panel     Component Value Date/Time   CHOL 143 07/20/2023 0000   CHOL 133 12/19/2022 1435   TRIG 97 07/20/2023 0000   HDL 93 (A) 07/20/2023 0000   HDL 46 12/19/2022 1435   CHOLHDL 2.9 12/19/2022 1435   LDLCALC 75 07/20/2023 0000   LDLCALC 67 12/19/2022 1435   Hepatic Function Panel     Component Value Date/Time   PROT 6.7 12/19/2022 1435   ALBUMIN 4.3 07/20/2023 0000   ALBUMIN 4.4 12/19/2022 1435   AST 21 07/20/2023 0000   ALT 27 07/20/2023 0000   ALKPHOS 117 07/20/2023 0000   BILITOT 0.5 12/19/2022 1435      Component Value Date/Time   TSH 1.71 05/30/2018 0000   Nutritional Lab Results  Component Value Date   VD25OH 61.9 06/19/2023   VD25OH 77.1 12/19/2022   VD25OH 64.2 06/06/2022    Attestations:   I, ***, acting as a Stage manager for [name], DO., have compiled all relevant documentation for today's  office visit on behalf of [name], DO, while in the presence of [employer], DO.  Reviewed by clinician on day of visit: allergies, medications, problem list, medical history, surgical history, family history, social history, and previous encounter notes pertinent to patient's obesity diagnosis. I have spent 40 *** minutes in the care of the patient today including: preparing to see patient (e.g. review and interpretation of tests, old notes ), obtaining and/or reviewing separately obtained history, performing a medically appropriate examination or evaluation, counseling and educating the patient, ordering medications, test or procedures, documenting clinical information in the electronic or other health care record, and independently interpreting results and communicating results to the patient, family, or caregiver   I have reviewed the above documentation for accuracy and completeness, and I agree with the above. Pamela Love, D.O.  The 21st Century Cures Act was signed into law in 2016 which includes the topic of electronic health records.  This provides immediate access to information in MyChart.  This includes consultation notes, operative notes, office notes, lab results and pathology reports.  If you have any questions about what you read please let us know at your next visit so we can discuss your concerns and take corrective action if need be.  We are right here with you.

## 2023-10-04 ENCOUNTER — Encounter (HOSPITAL_BASED_OUTPATIENT_CLINIC_OR_DEPARTMENT_OTHER): Payer: Self-pay

## 2023-10-11 ENCOUNTER — Ambulatory Visit (INDEPENDENT_AMBULATORY_CARE_PROVIDER_SITE_OTHER): Payer: Medicare PPO | Admitting: Adult Health

## 2023-10-23 ENCOUNTER — Other Ambulatory Visit (HOSPITAL_BASED_OUTPATIENT_CLINIC_OR_DEPARTMENT_OTHER): Payer: Self-pay | Admitting: Cardiology

## 2023-10-23 DIAGNOSIS — I2510 Atherosclerotic heart disease of native coronary artery without angina pectoris: Secondary | ICD-10-CM

## 2023-10-25 ENCOUNTER — Ambulatory Visit (INDEPENDENT_AMBULATORY_CARE_PROVIDER_SITE_OTHER): Payer: Medicare PPO | Admitting: Adult Health

## 2023-10-25 ENCOUNTER — Encounter (INDEPENDENT_AMBULATORY_CARE_PROVIDER_SITE_OTHER): Payer: Self-pay | Admitting: Adult Health

## 2023-10-25 VITALS — BP 131/72 | HR 72 | Temp 97.6°F | Ht 63.0 in | Wt 170.0 lb

## 2023-10-25 DIAGNOSIS — I25118 Atherosclerotic heart disease of native coronary artery with other forms of angina pectoris: Secondary | ICD-10-CM

## 2023-10-25 DIAGNOSIS — I152 Hypertension secondary to endocrine disorders: Secondary | ICD-10-CM

## 2023-10-25 DIAGNOSIS — Z7985 Long-term (current) use of injectable non-insulin antidiabetic drugs: Secondary | ICD-10-CM | POA: Diagnosis not present

## 2023-10-25 DIAGNOSIS — E1159 Type 2 diabetes mellitus with other circulatory complications: Secondary | ICD-10-CM

## 2023-10-25 DIAGNOSIS — E1169 Type 2 diabetes mellitus with other specified complication: Secondary | ICD-10-CM | POA: Diagnosis not present

## 2023-10-25 DIAGNOSIS — E66812 Obesity, class 2: Secondary | ICD-10-CM

## 2023-10-25 DIAGNOSIS — Z6830 Body mass index (BMI) 30.0-30.9, adult: Secondary | ICD-10-CM | POA: Diagnosis not present

## 2023-10-25 NOTE — Progress Notes (Signed)
 WEIGHT SUMMARY AND BIOMETRICS  Vitals Temp: 97.6 F (36.4 C) BP: 131/72 Pulse Rate: 72 SpO2: 100 %   Anthropometric Measurements Height: 5\' 3"  (1.6 m) Weight: 170 lb (77.1 kg) BMI (Calculated): 30.12 Weight at Last Visit: 171 lb Weight Lost Since Last Visit: 1 Weight Gained Since Last Visit: 0 Starting Weight: 227 lb Total Weight Loss (lbs): 57 lb (25.9 kg) Peak Weight: 250 lb   Body Composition  Body Fat %: 43.6 % Fat Mass (lbs): 74.4 lbs Muscle Mass (lbs): 91.4 lbs Total Body Water (lbs): 74.2 lbs Visceral Fat Rating : 13   Other Clinical Data Fasting: no Labs: no Today's Visit #: 70 Starting Date: 03/17/20    Chief Complaint:   OBESITY Pamela Love is here to discuss her progress with her obesity treatment plan.  She is on the Cat 2 Meal Plan and states she is following her eating plan approximately 70 % of the time.  She states she is exercising Gym/Dancing 45/75 minutes 2/1 times per week.   Interim History:  09/25/2023 OV at [facility] She was converted from Cat 2 MP to Cat 1 MP- she remained on Cat 2 MP  She did increase lean protein and she did increase cardiovascular exercise. She added another day at gym.  03/17/2020 Starting Weight: 227 lbs with corresponding BMI 40.2 Today's Weight: 170 lbs with corresponding BMI 30  She has lost 57 lbs and reduced BMI by 10 points!  She safely came off Insulin. Only current antidiabetic therapy: Weekly Mounjaro 12.5mg   Subjective:   1. Type 2 diabetes mellitus with obesity (HCC) 09/14/2023 OV at Endocrinology/Dr. Elvera Lennox PLAN:  1. Patient with well-controlled type 2 diabetes, on injectable antidiabetic regimen with GLP-1/GIP receptor agonist only.  At last visit, sugars were slightly higher than goal in the morning but they were at goal later in the day.  She was taking a very low-dose of insulin, 6 units daily.  After checking her insulin production and antipancreatic antibodies, we ended up stopping insulin at  last visit.  She continues to see Dr. Dalbert Garnet in the weight management clinic. -Last visit, HbA1c was normal, at 5.5% and it remained stable at last check 3 months ago, off insulin. -At today's visit, sugars are at goal for slightly above target in the morning, however, they are well-controlled later in the day with the best control after dinner.  She continues on the same dose of Mounjaro, 12.5 mg weekly, tolerated well.  Her weight has stagnated since last visit, possibly due to the holidays.  No need to change the regimen for now from the diabetes point of view. - I suggested to:  Patient Instructions  Please continue: - Mounjaro 12.5 mg weekly   Please return in 6 months with your sugar log.    - we checked her HbA1c: 5.5% (stable, great) - advised to check sugars at different times of the day - 1x a day, rotating check times - advised for yearly eye exams >> she is UTD - return to clinic in 6  months   2. HL -Reviewed latest lipid panel from 07/2023: Fractions at goal with the exception of an LDL above our target of less than 55 due to cardiovascular disease: Recent Labs       Lab Results  Component Value Date    CHOL 143 07/20/2023    HDL 93 (A) 07/20/2023    LDLCALC 75 07/20/2023    TRIG 97 07/20/2023    CHOLHDL 2.9 12/19/2022    -  She is on Crestor 40 mg daily without side effects   3.  Obesity class II -Continues on Mounjaro which should also help with weight loss -At last visit she was wondering about goal weight and I recommended a BMI of 25 or lower. -Before the last 3 visits combined, she lost approximately 60 pounds -At today's visit, weight is approximately stable -She continues to go to the weight management clinic    She is currently on weekly Mounjaro 12.5mg  Denies mass in neck, dysphagia, dyspepsia, persistent hoarseness, abdominal pain, or N/V/C  She has been infrequently checking home CBG Most recent readings: 10/18/23 PP 141 10/25/2023 Fasting 137  She  denies sx's of hypoglycemia  2. Hypertension associated with type 2 diabetes mellitus (HCC) BP at goal at OV She denies CP with exertion She is currently on aspirin EC 81 MG tablet  carvedilol (COREG) 3.125 MG tablet  ranolazine (RANEXA) 1000 MG SR tablet  rosuvastatin (CRESTOR) 40 MG tablet  nitroGLYCERIN (NITROSTAT) 0.4 MG SL tablet   3. Coronary artery disease involving native coronary artery of native heart with other form of angina pectoris (HCC) 07/18/2023 Cards OV Notes ASSESSMENT AND PLAN: .     CAD, with prior PCI Mixed hyperlipidemia Type II diabetes Obesity, BMI 30 (starting BMI 40 in 2021) -Aspirin 81 mg, clopidogrel 75 mg -statin: continue rosuvastatin 40 mg daily -LDL goal <70, LDL 67 11/2022 -antianginals: beta blocker (carvedilol), ranolazine. Not on isosorbide as it made her hypotensive -Given type II diabetes and CAD, she is on GLP1RA. Tolerates mounjaro better than ozempic -has prescription for sublingual nitroglycerin, counseled on lifespan of this medication -counseled on diet recommendations and AHA exercise guidelines, below -risk factor modification, as below -counseled on red flag warning signs that need immediate medical attention  -no chest pain since GI meds adjusted -lasix stopped with lightheadedness, has mild end of day edema but resolves on its own    Assessment/Plan:   1. Type 2 diabetes mellitus with obesity (HCC) (Primary) Continue healthy eating and regular exercise Continue weekly Mounjaro 12.5mg - does not require refill today  2. Hypertension associated with type 2 diabetes mellitus (HCC) Continue healthy eating and regular exercise Continue aspirin EC 81 MG tablet  carvedilol (COREG) 3.125 MG tablet  ranolazine (RANEXA) 1000 MG SR tablet  rosuvastatin (CRESTOR) 40 MG tablet  nitroGLYCERIN (NITROSTAT) 0.4 MG SL tablet   3. Coronary artery disease involving native coronary artery of native heart with other form of angina pectoris  (HCC) Continue healthy eating and regular exercise  4. BMI 30.0-30.9,adult -- Current BMI 30.3  Pamela Love is currently in the action stage of change. As such, her goal is to continue with weight loss efforts. She has agreed to the Category 2 Plan.   Exercise goals: Older adults should follow the adult guidelines. When older adults cannot meet the adult guidelines, they should be as physically active as their abilities and conditions will allow.  Older adults should do exercises that maintain or improve balance if they are at risk of falling.  Older adults should determine their level of effort for physical activity relative to their level of fitness.  Older adults with chronic conditions should understand whether and how their conditions affect their ability to do regular physical activity safely.  Behavioral modification strategies: increasing lean protein intake, decreasing simple carbohydrates, increasing vegetables, increasing water intake, meal planning and cooking strategies, keeping healthy foods in the home, ways to avoid boredom eating, celebration eating strategies, planning for success, and decreasing junk  food.  Pamela Love has agreed to follow-up with our clinic in 2 weeks. She was informed of the importance of frequent follow-up visits to maximize her success with intensive lifestyle modifications for her multiple health conditions.   Objective:   Blood pressure 131/72, pulse 72, temperature 97.6 F (36.4 C), height 5\' 3"  (1.6 m), weight 170 lb (77.1 kg), SpO2 100%. Body mass index is 30.11 kg/m.  General: Cooperative, alert, well developed, in no acute distress. HEENT: Conjunctivae and lids unremarkable. Cardiovascular: Regular rhythm.  Lungs: Normal work of breathing. Neurologic: No focal deficits.   Lab Results  Component Value Date   CREATININE 1.0 07/20/2023   BUN 34 (A) 07/20/2023   NA 139 07/20/2023   K 4.2 07/20/2023   CL 107 07/20/2023   CO2 27 (A) 07/20/2023   Lab  Results  Component Value Date   ALT 27 07/20/2023   AST 21 07/20/2023   ALKPHOS 117 07/20/2023   BILITOT 0.5 12/19/2022   Lab Results  Component Value Date   HGBA1C 5.5 09/14/2023   HGBA1C 5.5 06/19/2023   HGBA1C 5.5 03/14/2023   HGBA1C 5.7 (H) 12/19/2022   HGBA1C 5.5 08/30/2022   No results found for: "INSULIN" Lab Results  Component Value Date   TSH 1.71 05/30/2018   Lab Results  Component Value Date   CHOL 143 07/20/2023   HDL 93 (A) 07/20/2023   LDLCALC 75 07/20/2023   TRIG 97 07/20/2023   CHOLHDL 2.9 12/19/2022   Lab Results  Component Value Date   VD25OH 61.9 06/19/2023   VD25OH 77.1 12/19/2022   VD25OH 64.2 06/06/2022   Lab Results  Component Value Date   WBC 7.5 11/13/2021   HGB 14.0 11/13/2021   HCT 41.3 11/13/2021   MCV 93.0 11/13/2021   PLT 243 11/13/2021   No results found for: "IRON", "TIBC", "FERRITIN"  Attestation Statements:   Reviewed by clinician on day of visit: allergies, medications, problem list, medical history, surgical history, family history, social history, and previous encounter notes.  Time spent on visit including pre-visit chart review and post-visit care and charting was 28 minutes.   I have reviewed the above documentation for accuracy and completeness, and I agree with the above. -  Pamela Love d. Rayquan Amrhein, NP-C

## 2023-11-01 DIAGNOSIS — H5213 Myopia, bilateral: Secondary | ICD-10-CM | POA: Diagnosis not present

## 2023-11-01 DIAGNOSIS — E119 Type 2 diabetes mellitus without complications: Secondary | ICD-10-CM | POA: Diagnosis not present

## 2023-11-01 DIAGNOSIS — Z961 Presence of intraocular lens: Secondary | ICD-10-CM | POA: Diagnosis not present

## 2023-11-02 DIAGNOSIS — N183 Chronic kidney disease, stage 3 unspecified: Secondary | ICD-10-CM | POA: Diagnosis not present

## 2023-11-02 DIAGNOSIS — E1122 Type 2 diabetes mellitus with diabetic chronic kidney disease: Secondary | ICD-10-CM | POA: Diagnosis not present

## 2023-11-02 DIAGNOSIS — M109 Gout, unspecified: Secondary | ICD-10-CM | POA: Diagnosis not present

## 2023-11-14 ENCOUNTER — Encounter (INDEPENDENT_AMBULATORY_CARE_PROVIDER_SITE_OTHER): Payer: Self-pay | Admitting: Family Medicine

## 2023-11-14 ENCOUNTER — Other Ambulatory Visit (HOSPITAL_BASED_OUTPATIENT_CLINIC_OR_DEPARTMENT_OTHER): Payer: Self-pay

## 2023-11-14 ENCOUNTER — Ambulatory Visit (INDEPENDENT_AMBULATORY_CARE_PROVIDER_SITE_OTHER): Admitting: Family Medicine

## 2023-11-14 VITALS — BP 114/72 | HR 82 | Temp 98.4°F | Ht 63.0 in | Wt 171.0 lb

## 2023-11-14 DIAGNOSIS — E1169 Type 2 diabetes mellitus with other specified complication: Secondary | ICD-10-CM | POA: Diagnosis not present

## 2023-11-14 DIAGNOSIS — E66813 Obesity, class 3: Secondary | ICD-10-CM

## 2023-11-14 DIAGNOSIS — E1159 Type 2 diabetes mellitus with other circulatory complications: Secondary | ICD-10-CM

## 2023-11-14 DIAGNOSIS — E669 Obesity, unspecified: Secondary | ICD-10-CM

## 2023-11-14 DIAGNOSIS — E559 Vitamin D deficiency, unspecified: Secondary | ICD-10-CM

## 2023-11-14 DIAGNOSIS — Z7985 Long-term (current) use of injectable non-insulin antidiabetic drugs: Secondary | ICD-10-CM | POA: Diagnosis not present

## 2023-11-14 DIAGNOSIS — Z6830 Body mass index (BMI) 30.0-30.9, adult: Secondary | ICD-10-CM

## 2023-11-14 DIAGNOSIS — I152 Hypertension secondary to endocrine disorders: Secondary | ICD-10-CM

## 2023-11-14 MED ORDER — TIRZEPATIDE 12.5 MG/0.5ML ~~LOC~~ SOAJ
12.5000 mg | SUBCUTANEOUS | 0 refills | Status: DC
Start: 2023-11-14 — End: 2023-12-21
  Filled 2023-11-14 – 2023-12-20 (×2): qty 6, 84d supply, fill #0

## 2023-11-14 NOTE — Progress Notes (Signed)
 [name], D.O.  ABFM, ABOM Specializing in Clinical Bariatric Medicine  Office located at: [address] [place], [place]  [address]   Assessment and Plan:   Medications Discontinued During This Encounter  Medication Reason   tirzepatide [facility]) 12.5 MG/0.5ML Pen Reorder     Meds ordered this encounter  Medications   tirzepatide (MOUNJARO) 12.5 MG/0.5ML Pen    Sig: Inject 12.5 mg into the skin once a week on Thursday.    Dispense:  6 mL    Refill:  0    Please dispense 90 day of medication for pt's diabetes management if possible    FOR THE DISEASE OF OBESITY:  BMI 30.0-30.9,adult -- Current BMI 30.3 Obesity - Starting BMI 40.22 date 03/17/20 Assessment & Plan: Since last office visit on 10/25/2023 patient's  Muscle mass has decreased by 1 lb. Fat mass has increased by 1.6 lb. Total body water has increased by 2 lb.  Counseling done on how various foods will affect these numbers and how to maximize success  Total lbs lost to date: 56 lbs  Total weight loss percentage to date: 24.67%    Recommended Dietary Goals Yuliet is currently in the action stage of change. As such, her goal is to continue weight management plan.  She has agreed to: continue current plan   Behavioral Intervention We discussed the following today: increasing lean protein intake to established goals  Additional resources provided today: Handout on CAT 1 meal plan , Handout on CAT 1-2 breakfast options, and Handout on CAT 1-2 lunch options  Evidence-based interventions for health behavior change were utilized today including the discussion of self monitoring techniques, problem-solving barriers and SMART goal setting techniques.   Regarding patient's less desirable eating habits and patterns, we employed the technique of small changes.   Pt will specifically work on: drinking 40-48 ounces of water daily and getting in 4 ounces of lean protein at lunch and 6 ounces at dinner.     Recommended Physical Activity Goals Tramya has been advised to work up to 300-450 minutes of moderate intensity aerobic activity a week and strengthening exercises 2-3 times per week for cardiovascular health, weight loss maintenance and preservation of muscle mass.   She has agreed to : increase exercise (e.g weight lifting) once she recovers from gout.    Pharmacotherapy We both agreed to :  continue same regimen   FOR ASSOCIATED CONDITIONS ADDRESSED TODAY:  Type 2 diabetes mellitus with obesity [facility]) Assessment & Plan: Lab Results  Component Value Date   HGBA1C 5.5 09/14/2023   HGBA1C 5.5 06/19/2023   HGBA1C 5.5 03/14/2023    HgbA1c is at goal for age and comorbid conditions. Since last being seen on 3/26 she checked her fasting blood sugar on two occasions: 138 and 174. She checked her 2 hour PPBS on one occasion: 115. Denies symptoms of hypoglycemia or hyperglycemia. On Mounjaro 12.5 mg weekly with good adherence and no side effects. Her hunger/cravings are controlled. Continue same regimen and reduced calorie meal plan low on processed crabs and simple sugars.    Hypertension associated with type 2 diabetes mellitus (HCC) Assessment & Plan: Last 3 blood pressure readings in our office are as follows: BP Readings from Last 3 Encounters:  11/14/23 114/72  10/25/23 131/72  09/25/23 120/72   The ASCVD Risk score (Arnett DK, et al., 2019) failed to calculate for the following reasons:   Risk score cannot be calculated because patient has a medical history suggesting  prior/existing ASCVD  Lab Results  Component Value Date   CREATININE 1.0 07/20/2023    Pt reports compliance with   Continue weight loss therapy -  losing 10% or more of body weight may improve condition   Continue with low sodium diet, advance exercise as tolerated.   Continue to work on nutrition plan to promote weight loss and improve BP control.   Continue adherence to antihypertensive therapy and  low sodium diet, advance exercise as able.   Blood pressure is well-controlled today on Coreg      on ***. Continue medication. Losing 10% of body weight may improve blood pressure control     Blood pressures per ambulotry blood pressure monitering.  Ambulatroy blodo pressure values: 117/57 pulse 83  107/53 pulse 76 106/52 pulse 74  Pt asx. Coreg only   Vitamin D deficiency Assessment & Plan:       Follow up:   No follow-ups on file. She was informed of the importance of frequent follow up visits to maximize her success with intensive lifestyle modifications for her multiple health conditions.  Subjective:   Chief complaint: Obesity Dariella is here to discuss her progress with her obesity treatment plan. She is on the Category 1 Plan with Breakfast and Lunch options and states she is following her eating plan approximately 65% of the time. She states she is not exercising d/t gout in her left foot.   Interval History:  Alitza Dickenman Orantes is here for a follow up office visit. Since last OV on 10/25/2023, Mrs.Blackston is up 1 lb. She has been eating relatively healthy apart from 3 celebratory events. Her hunger and cravings are controlled.   Pharmacotherapy that aid with weight loss: She is currently taking  Mounjaro 12.5 mg weekly .   Review of Systems:  Pertinent positives were addressed with patient today.  Reviewed by clinician on day of visit: allergies, medications, problem list, medical history, surgical history, family history, social history, and previous encounter notes.  Weight Summary and Biometrics   Weight Lost Since Last Visit: 0  Weight Gained Since Last Visit: 1lb   Vitals Temp: 98.4 F (36.9 C) BP: 114/72 Pulse Rate: 82 SpO2: 96 %   Anthropometric Measurements Height: 5\' 3"  (1.6 m) Weight: 171 lb (77.6 kg) BMI (Calculated): 30.3 Weight at Last Visit: 170lb Weight Lost Since Last Visit: 0 Weight Gained Since Last Visit: 1lb Starting  Weight: 227lb Total Weight Loss (lbs): 56 lb (25.4 kg) Peak Weight: 250lb   Body Composition  Body Fat %: 44.4 % Fat Mass (lbs): 76 lbs Muscle Mass (lbs): 90.4 lbs Total Body Water (lbs): 76.2 lbs Visceral Fat Rating : 13   Other Clinical Data Fasting: no Labs: no Today's Visit #: 77 Starting Date: 03/17/20   Objective:   PHYSICAL EXAM: Blood pressure 114/72, pulse 82, temperature 98.4 F (36.9 C), height 5\' 3"  (1.6 m), weight 171 lb (77.6 kg), SpO2 96%. Body mass index is 30.29 kg/m.  General: she is overweight, cooperative and in no acute distress. PSYCH: Has normal mood, affect and thought process.   HEENT: EOMI, sclerae are anicteric. Lungs: Normal breathing effort, no conversational dyspnea. Extremities: Moves * 4 Neurologic: A and O * 3, good insight  DIAGNOSTIC DATA REVIEWED: BMET    Component Value Date/Time   NA 139 07/20/2023 0000   K 4.2 07/20/2023 0000   CL 107 07/20/2023 0000   CO2 27 (A) 07/20/2023 0000   GLUCOSE 88 07/18/2023 1357   GLUCOSE 92 03/14/2023  1413   BUN 34 (A) 07/20/2023 0000   CREATININE 1.0 07/20/2023 0000   CREATININE 1.03 (H) 07/18/2023 1357   CALCIUM 9.8 07/20/2023 0000   GFRNONAA 54 (L) 11/13/2021 1933   GFRAA 54 (L) 05/12/2020 1557   Lab Results  Component Value Date   HGBA1C 5.5 09/14/2023   HGBA1C 6 1 02/09/2015   No results found for: "INSULIN" Lab Results  Component Value Date   TSH 1.71 05/30/2018   CBC    Component Value Date/Time   WBC 7.5 11/13/2021 1933   RBC 4.44 11/13/2021 1933   HGB 14.0 11/13/2021 1933   HGB 13.8 01/26/2021 1427   HCT 41.3 11/13/2021 1933   HCT 41.6 01/26/2021 1427   PLT 243 11/13/2021 1933   PLT 229 01/26/2021 1427   MCV 93.0 11/13/2021 1933   MCV 92 01/26/2021 1427   MCH 31.5 11/13/2021 1933   MCHC 33.9 11/13/2021 1933   RDW 12.1 11/13/2021 1933   RDW 13.2 01/26/2021 1427   Iron Studies No results found for: "IRON", "TIBC", "FERRITIN", "IRONPCTSAT" Lipid Panel      Component Value Date/Time   CHOL 143 07/20/2023 0000   CHOL 133 12/19/2022 1435   TRIG 97 07/20/2023 0000   HDL 93 (A) 07/20/2023 0000   HDL 46 12/19/2022 1435   CHOLHDL 2.9 12/19/2022 1435   LDLCALC 75 07/20/2023 0000   LDLCALC 67 12/19/2022 1435   Hepatic Function Panel     Component Value Date/Time   PROT 6.7 12/19/2022 1435   ALBUMIN 4.3 07/20/2023 0000   ALBUMIN 4.4 12/19/2022 1435   AST 21 07/20/2023 0000   ALT 27 07/20/2023 0000   ALKPHOS 117 07/20/2023 0000   BILITOT 0.5 12/19/2022 1435      Component Value Date/Time   TSH 1.71 05/30/2018 0000   Nutritional Lab Results  Component Value Date   VD25OH 61.9 06/19/2023   VD25OH 77.1 12/19/2022   VD25OH 64.2 06/06/2022    Attestations:   I, Special, acting as a medical scribe for Marceil Sensor, DO., have compiled all relevant documentation for today's office visit on behalf of Marceil Sensor, DO, while in the presence of [employer], DO.  Reviewed by clinician on day of visit: allergies, medications, problem list, medical history, surgical history, family history, social history, and previous encounter notes pertinent to patient's obesity diagnosis.  I have reviewed the above documentation for accuracy and completeness, and I agree with the above. Rae Bugler, D.O.  The 21st Century Cures Act was signed into law in 2016 which includes the topic of electronic health records.  This provides immediate access to information in MyChart.  This includes consultation notes, operative notes, office notes, lab results and pathology reports.  If you have any questions about what you read please let us  know at your next visit so we can discuss your concerns and take corrective action if need be.  We are right here with you.

## 2023-11-16 ENCOUNTER — Ambulatory Visit (INDEPENDENT_AMBULATORY_CARE_PROVIDER_SITE_OTHER): Admitting: Adult Health

## 2023-11-16 ENCOUNTER — Ambulatory Visit: Payer: Medicare PPO

## 2023-11-20 ENCOUNTER — Ambulatory Visit
Admission: RE | Admit: 2023-11-20 | Discharge: 2023-11-20 | Disposition: A | Discharge status: Home / Self Care | Source: Ambulatory Visit | Attending: Family Medicine | Admitting: Family Medicine

## 2023-11-20 DIAGNOSIS — Z1231 Encounter for screening mammogram for malignant neoplasm of breast: Secondary | ICD-10-CM | POA: Diagnosis not present

## 2023-11-21 DIAGNOSIS — G4733 Obstructive sleep apnea (adult) (pediatric): Secondary | ICD-10-CM | POA: Diagnosis not present

## 2023-12-05 ENCOUNTER — Ambulatory Visit: Payer: Medicare PPO | Attending: Cardiovascular Disease | Admitting: Cardiovascular Disease

## 2023-12-05 ENCOUNTER — Encounter: Payer: Self-pay | Admitting: Cardiovascular Disease

## 2023-12-05 VITALS — BP 124/76 | HR 70 | Ht 63.0 in | Wt 175.4 lb

## 2023-12-05 DIAGNOSIS — I2510 Atherosclerotic heart disease of native coronary artery without angina pectoris: Secondary | ICD-10-CM | POA: Diagnosis not present

## 2023-12-05 DIAGNOSIS — I10 Essential (primary) hypertension: Secondary | ICD-10-CM

## 2023-12-05 DIAGNOSIS — E876 Hypokalemia: Secondary | ICD-10-CM

## 2023-12-05 DIAGNOSIS — Z955 Presence of coronary angioplasty implant and graft: Secondary | ICD-10-CM

## 2023-12-05 DIAGNOSIS — E782 Mixed hyperlipidemia: Secondary | ICD-10-CM | POA: Diagnosis not present

## 2023-12-05 NOTE — Progress Notes (Signed)
 Cardiology Office Note    Date:  12/05/2023   ID:  Pamela Love, Pamela Love 11-06-1950, MRN [id number]  PCP:  Glena Landau, MD  Cardiologist:  Magnus Schuller, MD (sleep); Sheryle Donning, MD  2-year follow-up sleep evaluation  History of Present Illness:  Pamela Love is a 73 y.o. female who has a history of coronary artery disease and is status post PCI to her RCA in June 2021 as well as a history of hypertension, type 2 diabetes mellitus, and hyperlipidemia.  She is followed by Dr. Kardie Tobb for her cardiology care.  I last saw her on October 12, 2022.  She presents for 2-year follow-up sleep evaluation.   Due to concerns for obstructive sleep apnea she was referred for a diagnostic polysomnogram which was done on January 31, 2020.  She was found to have moderate overall sleep apnea with an AHI of 18.2/h.  She was unable to achieve any REM sleep.  She had oxygen desaturation to a nadir of 84% and there was evidence for moderate snoring during the evaluation.  She subsequently underwent a CPAP titration study on April 24, 2020.  CPAP was initiated at 5 cm and was titrated to 14 cm of water with an AHI of 5.6 and O2 nadir at 85%.  Again, she did not achieve any REM sleep throughout the study.  An optimal PAP pressure could not be selectively she ultimately was recommended to initiate therapy with CPAP auto at a range of 11-20 with EPR of 3.  I saw her for my initial sleep evaluation on September 28, 2020.  Prior to initiating CPAP therapy she had complaints of snoring, nonrestorative sleep, and occasional nocturia.  Her CPAP set up date was June 18, 2020.  Since being on therapy, she is unaware of any breakthrough snoring her sleep is more restful and she feels more alert.  An Epworth Sleepiness Scale score was recalculated in the office today and this endorsed at 7 arguing against daytime sleepiness.  I was able to obtain a download from from January 27 through September 25, 2020.  Compliance is excellent with 100% use and average use at 7 hours per night.  Her CPAP is set at a pressure range of 8 to 18 cm of water.  AHI is excellent at 1.4/h.  Her 95th percentile pressure is 15 with a maximum average pressure at 16.7 cm of water.    When I last saw her on October 12, 2022 she had a 47 pound weight loss in 2-1/2 years and has been participating in the [facility] health healthy weight and wellness program.  She continued to use CPAP therapy and a download was obtained from January 15 through September 15, 2022 which shows excellent compliance at 100%.  Average sleep duration with CPAP was 7 hours and 21 minutes.  At a pressure range of 10 to 20 cm of water, AHI is excellent at 0 point with her 95th percentile pressure at 615.7 and maximum average pressure at 17.1.  She does have mask leak.  She also has had some issues with blistering on the bridge of her nose.  She now uses [facility] as her DME provider.  During that evaluation, I suggested she switch to a ResMed AirFit N30i mask rather than her fullface mask and if a fullface was needed she would potentially benefit more from the F30i mask.  Since I saw her, she continues to use CPAP therapy.  She  now uses [employer] as her DME company.  Apparently she had issues with her old ResMed AirSense 10 AutoSet unit (set up date June 18, 2020) and is now is using a Industrial/product designer from [place].  Data from her old machine from November 6 through July 06, 2023 showed her 95th percentile pressure was 13.7 with maximal average pressure 15.1.  AHI was 1.3. We had contacted [employer] to provide recent data with her loaner machine and apparently use has been 7 and half hours per night with AHI less than 1.0.  Sleep duration is 7 and half to 8 hours.    An Epworth Sleepiness Scale score was calculated in the office today and this endorsed at 14 consistent with residual daytime sleepiness.  She now sees Dr. Sheryle Donning for her  cardiology care.  Past Medical History:  Diagnosis Date   Abnormal nuclear stress test 01/02/2020   Arthritis    Bilateral leg edema 01/02/2020   Bursitis    hips   C. difficile colitis 12/2017   CAD (coronary artery disease) 01/06/2020   CKD (chronic kidney disease), stage III (HCC)    Complication of anesthesia    "spinal did not work with second c section"   Diabetes mellitus (HCC) 11/19/2015   Diabetes mellitus, type 2 (HCC)    diet controlled - has Rx for Glymipride but has not started taking yet   Dyslipidemia 10/21/2014   GERD (gastroesophageal reflux disease)    History of nonmelanoma skin cancer    History of skin cancer    Hypercholesterolemia 12/11/2019   Hyperlipidemia    Hypertension    Hypertensive disorder 10/21/2014   Lower extremity edema    Mixed hyperlipidemia 01/02/2020   OAB (overactive bladder)    Obesity (BMI 30-39.9) 05/12/2020   OSA (obstructive sleep apnea) 05/12/2020   Osteoarthritis of right knee 05/15/2015   Osteoarthrosis, unspecified whether generalized or localized, involving lower leg 10/21/2014   Osteoporosis    Over weight    Primary osteoarthritis of left knee 12/18/2014   S/P knee replacement 12/18/2014   S/P total knee replacement using cement 05/15/2015   Sleep apnea    Tachycardia 12/11/2019    Past Surgical History:  Procedure Laterality Date   ABDOMINAL HYSTERECTOMY  2005   CATARACT EXTRACTION     CESAREAN SECTION     x2   colonscopy      CORONARY PRESSURE/FFR STUDY N/A 01/06/2020   Procedure: INTRAVASCULAR PRESSURE WIRE/FFR STUDY;  Surgeon: Avanell Leigh, MD;  Location: [facility] INVASIVE CV LAB;  Service: Cardiovascular;  Laterality: N/A;   CORONARY STENT INTERVENTION N/A 01/06/2020   Procedure: CORONARY STENT INTERVENTION;  Surgeon: Avanell Leigh, MD;  Location: [facility] INVASIVE CV LAB;  Service: Cardiovascular;  Laterality: N/A;   EYE SURGERY     bilat cataract surgery 2015   LEFT HEART CATH AND CORONARY ANGIOGRAPHY N/A 01/06/2020    Procedure: LEFT HEART CATH AND CORONARY ANGIOGRAPHY;  Surgeon: Avanell Leigh, MD;  Location: [facility] INVASIVE CV LAB;  Service: Cardiovascular;  Laterality: N/A;   LEFT HEART CATH AND CORONARY ANGIOGRAPHY N/A 01/29/2021   Procedure: LEFT HEART CATH AND CORONARY ANGIOGRAPHY;  Surgeon: Lucendia Rusk, MD;  Location: [facility] INVASIVE CV LAB;  Service: Cardiovascular;  Laterality: N/A;   TOTAL KNEE ARTHROPLASTY Left 12/18/2014   Procedure: TOTAL LEFT KNEE ARTHROPLASTY;  Surgeon: Genevie Kerns, MD;  Location: [facility] OR[facility];  Service: Orthopedics;  Laterality: Left;   TOTAL KNEE ARTHROPLASTY Right 05/15/2015   Procedure: RIGHT TOTAL KNEE  ARTHROPLASTY;  Surgeon: Genevie Kerns, MD;  Location: [facility];  Service: Orthopedics;  Laterality: Right;   UTERINE FIBROID SURGERY      Current Medications: Outpatient Medications Prior to Visit  Medication Sig Dispense Refill   Accu-Chek Softclix Lancets lancets USE TO CHECK BLOOD SUGAR TWICE DAILY 200 each 3   acetaminophen  (TYLENOL ) 650 MG CR tablet Take 1,300 mg by mouth at bedtime.     aspirin  EC 81 MG tablet Take 1 tablet (81 mg total) by mouth in the morning.     Blood Glucose Monitoring Suppl (ACCU-CHEK GUIDE ME) w/Device KIT 1 each by Does not apply route 2 (two) times daily. E11.9 1 kit 0   carvedilol  (COREG ) 3.125 MG tablet TAKE 1 TABLET BY MOUTH TWICE A DAY WITH A MEAL 180 tablet 2   Cholecalciferol (VITAMIN D ) 50 MCG (2000 UT) CAPS Take 1 capsule (2,000 Units total) by mouth every morning.     clopidogrel  (PLAVIX ) 75 MG tablet TAKE 1 TABLET BY MOUTH EVERY DAY WITH BREAKFAST 90 tablet 2   Coenzyme Q10 (COQ10) 200 MG CAPS Take 200 mg by mouth in the morning.     DULoxetine  (CYMBALTA ) 20 MG capsule Take 40 mg by mouth at bedtime.      glucose blood (ACCU-CHEK GUIDE) test strip Use to check BS 2x a day 200 each 12   levocetirizine (XYZAL ) 5 MG tablet Take 1 tablet (5 mg total) by mouth every evening. 30 tablet 0   Multiple Vitamin (MULTIVITAMIN WITH MINERALS) TABS  tablet Take 1 tablet by mouth every morning.      nitroGLYCERIN  (NITROSTAT ) 0.4 MG SL tablet PLACE 1 TABLET (0.4 MG TOTAL) UNDER THE TONGUE EVERY 5 (FIVE) MINUTES X 3 DOSES AS NEEDED FOR CHEST PAIN. 75 tablet 1   Omega-3 Fatty Acids (RA FISH OIL) 1400 MG CPDR Take 1,400 mg by mouth 2 (two) times daily.     pantoprazole  (PROTONIX ) 40 MG tablet Take 40 mg by mouth daily.     ranolazine  (RANEXA ) 1000 MG SR tablet TAKE 1 TABLET BY MOUTH TWICE A DAY 180 tablet 2   rosuvastatin  (CRESTOR ) 40 MG tablet TAKE 1 TABLET (40 MG TOTAL) BY MOUTH IN THE MORNING 90 tablet 3   saccharomyces boulardii (FLORASTOR) 250 MG capsule Take 250 mg by mouth in the morning.     tirzepatide  (MOUNJARO ) 10 MG/0.5ML Pen Inject 10 mg into the skin once a week. 6 mL 5   tirzepatide  (MOUNJARO ) 12.5 MG/0.5ML Pen Inject 12.5 mg into the skin once a week on Thursday. 6 mL 0   trospium (SANCTURA) 20 MG tablet Take 20 mg by mouth 2 (two) times daily.  3   vitamin C (ASCORBIC ACID) 250 MG tablet Take 250 mg by mouth in the morning.     No facility-administered medications prior to visit.     Allergies:   Amoxicillin-pot clavulanate, Aspirin , Colchicine, Diclofenac, Dulaglutide, Empagliflozin , Ezetimibe, Imdur  [isosorbide  nitrate], Isosorbide , Metformin , Repaglinide , Sitagliptin  phos-metformin  hcl, Sulfamethoxazole-trimethoprim, Imipramine, and Tape   Social History   Socioeconomic History   Marital status: Married    Spouse name: Not on file   Number of children: Not on file   Years of education: Not on file   Highest education level: Not on file  Occupational History   Occupation: retired  Tobacco Use   Smoking status: Never   Smokeless tobacco: Never  Vaping Use   Vaping status: Never Used  Substance and Sexual Activity   Alcohol use: Yes    Comment:  rare   Drug use: No   Sexual activity: Not Currently    Partners: Male  Other Topics Concern   Not on file  Social History Narrative   Not on file   Social Drivers  of Health   Financial Resource Strain: Not on file  Food Insecurity: Not on file  Transportation Needs: Not on file  Physical Activity: Not on file  Stress: Not on file  Social Connections: Not on file    Socially she is married for 39 years.  She has 2 children ages 72 and 55 and 4 grandchildren. She was born in [place] .  She is retired third Psychiatrist.  Family History:  The patient's family history includes Breast cancer (age of onset: 72) in her maternal grandmother; Cancer in her mother; Congenital heart disease in her son; Diabetes in her maternal grandfather; Heart attack in her father, maternal grandfather, paternal uncle, and sister; Heart disease in her father and mother; Hyperlipidemia in her father and mother; Hypertension in her father and mother; Sleep apnea in her father; Sudden death in her father.   Her father died at age 25 with a heart attack.  Mother died at 42 and had dementia.  She has 2 sisters 1 who underwent CABG revascularization  ROS General: Negative; No fevers, chills, or night sweats;  HEENT: Negative; No changes in vision or hearing, sinus congestion, difficulty swallowing Pulmonary: Negative; No cough, wheezing, shortness of breath, hemoptysis Cardiovascular: Hypertension, CAD, hyperlipidemia GI: Negative; No nausea, vomiting, diarrhea, or abdominal pain GU: Negative; No dysuria, hematuria, or difficulty voiding Musculoskeletal: Negative; no myalgias, joint pain, or weakness Hematologic/Oncology: Negative; no easy bruising, bleeding Endocrine: Positive for diabetes mellitus for at least 10 years Neuro: Negative; no changes in balance, headaches Skin: Negative; No rashes or skin lesions Psychiatric: Negative; No behavioral problems, depression Sleep: OSA with previous snoring, nonrestorative sleep, and occasional nocturia; now on CPAP Other comprehensive 14 point system review is negative.   PHYSICAL EXAM:   VS:  BP 124/76    Pulse 70   Ht 5\' 3"  (1.6 m)   Wt 175 lb 6.4 oz (79.6 kg)   SpO2 96%   BMI 31.07 kg/m     Repeat blood pressure by me was 114/76  Wt Readings from Last 3 Encounters:  12/05/23 175 lb 6.4 oz (79.6 kg)  11/14/23 171 lb (77.6 kg)  10/25/23 170 lb (77.1 kg)  Weight on October 12, 2022 office visit  was 184 pounds.  General: Alert, oriented, no distress.  Skin: normal turgor, no rashes, warm and dry HEENT: Normocephalic, atraumatic. Pupils equal round and reactive to light; sclera anicteric; extraocular muscles intact;  Nose without nasal septal hypertrophy Mouth/Parynx benign; Mallinpatti scale 3 Neck: No JVD, no carotid bruits; normal carotid upstroke Lungs: clear to ausculatation and percussion; no wheezing or rales Chest wall: without tenderness to palpitation Heart: PMI not displaced, RRR, s1 s2 normal, 1/6 systolic murmur, no diastolic murmur, no rubs, gallops, thrills, or heaves Abdomen: soft, nontender; no hepatosplenomehaly, BS+; abdominal aorta nontender and not dilated by palpation. Back: no CVA tenderness Pulses 2+ Musculoskeletal: full range of motion, normal strength, no joint deformities Extremities: no clubbing cyanosis or edema, Homan's sign negative  Neurologic: grossly nonfocal; Cranial nerves grossly wnl Psychologic: Normal mood and affect     Studies/Labs Reviewed:   EKG Interpretation Date/Time:  Tuesday Dec 05 2023 13:02:55 EDT Ventricular Rate:  70 PR Interval:  174 QRS Duration:  88 QT Interval:  416  QTC Calculation: 449 R Axis:   -48  Text Interpretation: Normal sinus rhythm Pulmonary disease pattern Left anterior fascicular block When compared with ECG of 13-Nov-2021 19:21, No significant change was found Confirmed by Magnus Schuller ([id number]) on 12/05/2023 1:07:31 PM    September 28, 2020 ECG (independently read by me): NSR at 71  Recent Labs:    Latest Ref Rng & Units 07/20/2023   12:00 AM 07/18/2023    1:57 PM 06/19/2023    3:04 PM  BMP  Glucose  70 - 99 mg/dL  88  86   BUN 4 - 21 34     38  37   Creatinine 0.5 - 1.1 1.0     1.03  0.90   BUN/Creat Ratio 12 - 28  37  41   Sodium 137 - 147 139     142  141   Potassium 3.5 - 5.1 mEq/L 4.2     4.9  4.4   Chloride 99 - 108 107     104  104   CO2 13 - 22 27     24  20    Calcium  8.7 - 10.7 9.8     10.0  9.6      This result is from an external source.        Latest Ref Rng & Units 07/20/2023   12:00 AM 12/19/2022    2:35 PM 07/18/2022   12:00 AM  Hepatic Function  Total Protein 6.0 - 8.5 g/dL  6.7    Albumin 3.5 - 5.0 4.3     4.4  4.6      AST 13 - 35 21     22  25       ALT 7 - 35 U/L 27     25  32      Alk Phosphatase 25 - 125 117     108  92      Total Bilirubin 0.0 - 1.2 mg/dL  0.5       This result is from an external source.       Latest Ref Rng & Units 11/13/2021    7:33 PM 01/26/2021    2:27 PM 01/07/2020    4:43 AM  CBC  WBC 4.0 - 10.5 K/uL 7.5  7.0  7.1   Hemoglobin 12.0 - 15.0 g/dL 08.6  57.8  46.9   Hematocrit 36.0 - 46.0 % 41.3  41.6  39.1   Platelets 150 - 400 K/uL 243  229  225    Lab Results  Component Value Date   MCV 93.0 11/13/2021   MCV 92 01/26/2021   MCV 90.3 01/07/2020   Lab Results  Component Value Date   TSH 1.71 05/30/2018   Lab Results  Component Value Date   HGBA1C 5.5 09/14/2023     BNP No results found for: "BNP"  ProBNP No results found for: "PROBNP"   Lipid Panel     Component Value Date/Time   CHOL 143 07/20/2023 0000   CHOL 133 12/19/2022 1435   TRIG 97 07/20/2023 0000   HDL 93 (A) 07/20/2023 0000   HDL 46 12/19/2022 1435   CHOLHDL 2.9 12/19/2022 1435   LDLCALC 75 07/20/2023 0000   LDLCALC 67 12/19/2022 1435   LABVLDL 20 12/19/2022 1435     RADIOLOGY: MM 3D SCREENING MAMMOGRAM BILATERAL BREAST Result Date: 11/23/2023 CLINICAL DATA:  Screening. EXAM: DIGITAL SCREENING BILATERAL MAMMOGRAM WITH TOMOSYNTHESIS AND CAD TECHNIQUE: Bilateral screening digital craniocaudal and mediolateral oblique mammograms  were  obtained. Bilateral screening digital breast tomosynthesis was performed. The images were evaluated with computer-aided detection. COMPARISON:  Previous exam(s). ACR Breast Density Category b: There are scattered areas of fibroglandular density. FINDINGS: There are no findings suspicious for malignancy. IMPRESSION: No mammographic evidence of malignancy. A result letter of this screening mammogram will be mailed directly to the patient. RECOMMENDATION: Screening mammogram in one year. (Code:SM-B-01Y) BI-RADS CATEGORY  1: Negative. Electronically Signed   By: Anna Barnes M.D.   On: 11/23/2023 13:35     Additional studies/ records that were reviewed today include:   01/31/2020 CLINICAL INFORMATION Sleep Study Type: NPSG   Indication for sleep study: Snoring   Epworth Sleepiness Score: 8   SLEEP STUDY TECHNIQUE As per the AASM Manual for the Scoring of Sleep and Associated Events v2.3 (April 2016) with a hypopnea requiring 4% desaturations.   The channels recorded and monitored were frontal, central and occipital EEG, electrooculogram (EOG), submentalis EMG (chin), nasal and oral airflow, thoracic and abdominal wall motion, anterior tibialis EMG, snore microphone, electrocardiogram, and pulse oximetry.   MEDICATIONS acetaminophen  (TYLENOL  ARTHRITIS PAIN) 650 MG CR tablet aspirin  EC 81 MG tablet Blood Glucose Monitoring Suppl (ACCU-CHEK GUIDE ME) w/Device KIT carvedilol  (COREG ) 3.125 MG tablet Cholecalciferol (VITAMIN D ) 2000 UNITS CAPS clopidogrel  (PLAVIX ) 75 MG tablet Coenzyme Q10 (COQ10) 200 MG CAPS diclofenac sodium (VOLTAREN) 1 % GEL DULoxetine  (CYMBALTA ) 20 MG capsule furosemide  (LASIX ) 40 MG tablet glucose blood (ACCU-CHEK GUIDE) test strip insulin  glargine, 2 Unit Dial, (TOUJEO  MAX SOLOSTAR) 300 UNIT/ML Solostar Pen Insulin  Pen Needle (PEN NEEDLES) 32G X 5 MM MISC Multiple Vitamin (MULTIVITAMIN WITH MINERALS) TABS tablet nitroGLYCERIN  (NITROSTAT ) 0.4 MG SL tablet Omega-3 Fatty  Acids (RA FISH OIL) 1400 MG CPDR pantoprazole  (PROTONIX ) 40 MG tablet potassium chloride  SA (KLOR-CON ) 20 MEQ tablet rosuvastatin  (CRESTOR ) 20 MG tablet saccharomyces boulardii (FLORASTOR) 250 MG capsule trospium (SANCTURA) 20 MG tablet valsartan -hydrochlorothiazide  (DIOVAN -HCT) 160-12.5 MG tablet vitamin C (ASCORBIC ACID) 250 MG tablet Medications self-administered by patient taken the night of the study : CARVEDILOL , FISH OIL, trospium, TYLENOL  ALLERGY SINUS, DULOXETINE    SLEEP ARCHITECTURE The study was initiated at 10:54:31 PM and ended at 5:02:37 AM.   Sleep onset time was 61.9 minutes and the sleep efficiency was 60.0%%. The total sleep time was 220.7 minutes.   Stage REM latency was N/A minutes.   The patient spent 1.4%% of the night in stage N1 sleep, 98.6%% in stage N2 sleep, 0.0%% in stage N3 and 0% in REM.   Alpha intrusion was absent.   Supine sleep was 96.60%.   RESPIRATORY PARAMETERS The overall apnea/hypopnea index (AHI) was 18.2 per hour. The respiratory disturbance index (RDI) is 23.1/h. There were 1 total apneas, including 1 obstructive, 0 central and 0 mixed apneas. There were 66 hypopneas and 18 RERAs.   The AHI during Stage REM sleep was N/A per hour.   AHI while supine was 15.8 per hour.   The mean oxygen saturation was 92.6%. The minimum SpO2 during sleep was 84.0%.   Moderate snoring was noted during this study.   CARDIAC DATA The 2 lead EKG demonstrated sinus rhythm. The mean heart rate was 76.1 beats per minute. Other EKG findings include: isolated 5 beat burst of atrial tachycardia.   LEG MOVEMENT DATA The total PLMS were 0 with a resulting PLMS index of 0.0. Associated arousal with leg movement index was 0.0 .   IMPRESSIONS - Modederate obstructive sleep apnea overall (AHI 18.2/h; RDI 23.1/h) however REM sleep was  not present and the overall assessment may be underestimated if REM sleep had occurred. - No significant central sleep apnea occurred  during this study (CAI = 0.0/h). - Mild oxygen desaturation to a nadir of 84.0%. - The patient snored with moderate snoring volume. - No cardiac abnormalities were noted during this study. - Clinically significant periodic limb movements did not occur during sleep. No significant associated arousals.   DIAGNOSIS - Obstructive Sleep Apnea (G47.33) - Nocturnal Hypoxemia (G47.36)   RECOMMENDATIONS - Therapeutic CPAP titration to determine optimal pressure required to alleviate sleep disordered breathing. - Effort should be made to optimize nasal and oropharyngeal patency.  - Avoid alcohol, sedatives and other CNS depressants that may worsen sleep apnea and disrupt normal sleep architecture. - Sleep hygiene should be reviewed to assess factors that may improve sleep quality. - Weight management (BMI 41) and regular exercise should be initiated or continued if appropriate.    CPAP TITRATION 04/24/2020 RESPIRATORY PARAMETERS Optimal PAP Pressure (cm):   AHI at Optimal Pressure (/hr):            N/A Overall Minimal O2 (%):         80.0     Supine % at Optimal Pressure (%):    N/A Minimal O2 at Optimal Pressure (%): 80.0        SLEEP ARCHITECTURE The study was initiated at 9:45:26 PM and ended at 4:47:58 AM.   Sleep onset time was 107.3 minutes and the sleep efficiency was 40.1%%. The total sleep time was 169.5 minutes.   The patient spent 5.6%% of the night in stage N1 sleep, 94.4%% in stage N2 sleep, 0.0%% in stage N3 and 0% in REM.Stage REM latency was N/A minutes   Wake after sleep onset was 145.7. Alpha intrusion was absent. Supine sleep was 67.55%.   CARDIAC DATA The 2 lead EKG demonstrated sinus rhythm. The mean heart rate was 74.3 beats per minute. Other EKG findings include: None.   LEG MOVEMENT DATA The total Periodic Limb Movements of Sleep (PLMS) were 0. The PLMS index was 0.0. A PLMS index of <15 is considered normal in adults.   IMPRESSIONS - CPAP was initiated at 5 cm  and was titrated to 14 cm of water; AHI 5.6/h with O2 nadir 85%. REM sleep did not occur throughout the study.  An optimal PAP pressure could not be selected for this patient based on the available study data. - Central sleep apnea was not noted during this titration (CAI = 0.0/h). - Severe oxygen desaturation to a nadir of 80.0% at 11 cm of water. - Reduced sleep efficiency at only 40.1%. - The patient snored with soft snoring volume during this titration study. - No cardiac abnormalities were observed during this study. - Clinically significant periodic limb movements were not noted during this study. Arousals associated with PLMs were rare.   DIAGNOSIS - Obstructive Sleep Apnea (G47.33)   RECOMMENDATIONS - Recommend an initial trial of CPAP Auto with EPR of 3 at 11- 20 cm H2O and heated humidification.  - Effort should be made to optimize nasal and oropharyngeal patency.  - Avoid alcohol, sedatives and other CNS depressants that may worsen sleep apnea and disrupt normal sleep architecture. - Sleep hygiene should be reviewed to assess factors that may improve sleep quality. - Weight management and regular exercise should be initiated or continued. - Recommend a download in 30 days and sleep clinic evaluation after 4 weeks of therapy.,    ASSESSMENT:  1. Hypokalemia   2. Coronary artery disease involving native coronary artery of native heart without angina pectoris   3. History of coronary angioplasty with insertion of stent   4. Mixed hyperlipidemia   5. Essential hypertension      PLAN:  Ms. 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  At that time, she was unable to achieve any REM sleep. On her CPAP titration she also did not  achieve any REM sleep.  Her CPAP set up date was June 18, 2020.  When I saw her for my initial evaluation in February 2022 compliance was excellent and she was averaging 7 hours of CPAP use per night.  Her CPAP unit was set at a range of 8 to 18 cm of water.  Her 95th percentile pressure is 15 and maximum average pressure 16.7 cm of water.  AHI is excellent at 1.4.  At her initial evaluation I had a long discussion with her and reviewed the effects of untreated sleep apnea with reference to her cardiovascular health.  I discussed its effects on hypertension, potential nocturnal arrhythmias, nocturnal ischemia potentially resulting from hypoxemia as well as its effects on glucose, GERD, and inflammation.  In addition, I discussed the pathophysiology associated with frequent nocturia if sleep apnea is untreated.  I recommended she change to a ResMed AirFit N30 or F30 little high mask.  She admits to continued CPAP use.  Apparently, she is now using [employer] as her DME company and apparently has been using a loaner machine since there was issues with her prior CPAP unit with set up date November 2021.  We were able to call [employer] and apparently she is compliant with average use at 7 1/2 hours per night and AHI less than 1/h.  She typically goes to bed between 11 PM and midnight and often wakes up around 8:30 AM.  She has continued to be on carvedilol  3.125 mg twice a day, ranolazine  1000 units twice a day, and has continued to be on DAPT therapy.  She is on rosuvastatin  40 mg for hyperlipidemia.  Currently she is on Mounjaro  and has had additional weight loss with weight today at 175 and BMI 31.07.  She now sees Dr. Sheryle Donning at [facility] for primary cardiology care and is followed by Dr. Aldona Amel for endocrine.  I discussed with her my plans for retirement at the end of next month.  She will need to be transition to the sleep care of Dr. Micael Adas.  If she gets a new machine, she will need to be seen  within 90 days for compliance evaluation.  Otherwise she will be seen in 1 year for reassessment or sooner as needed.   Medication Adjustments/Labs and Tests Ordered: Current medicines are reviewed at length with the patient today.  Concerns regarding medicines are outlined above.  Medication changes, Labs and Tests ordered today are listed in the Patient Instructions below. There are no Patient Instructions on file for this visit.   Signed, Magnus Schuller, MD  12/05/2023 1:07 PM    [facility] [address], [place], [place]  [address] Phone: [phone]

## 2023-12-05 NOTE — Patient Instructions (Addendum)
 Medication Instructions:  NO CHANGES *If you need a refill on your cardiac medications before your next appointment, please call your pharmacy*  Lab Work: NO LABS If you have labs (blood work) drawn today and your tests are completely normal, you will receive your results only by: MyChart Message (if you have MyChart) OR A paper copy in the mail If you have any lab test that is abnormal or we need to change your treatment, we will call you to review the results.  Testing/Procedures: NO TESTING  Follow-Up: At [facility], you and your health needs are our priority.  As part of our continuing mission to provide you with exceptional heart care, our providers are all part of one team.  This team includes your primary Cardiologist (physician) and Advanced Practice Providers or APPs (Physician Assistants and Nurse Practitioners) who all work together to provide you with the care you need, when you need it.  Your next appointment:   1 year(s)  Provider:   Gaylyn Keas, MDfor sleep

## 2023-12-10 ENCOUNTER — Encounter: Payer: Self-pay | Admitting: Cardiovascular Disease

## 2023-12-19 ENCOUNTER — Other Ambulatory Visit (HOSPITAL_BASED_OUTPATIENT_CLINIC_OR_DEPARTMENT_OTHER): Payer: Self-pay

## 2023-12-20 ENCOUNTER — Other Ambulatory Visit (HOSPITAL_BASED_OUTPATIENT_CLINIC_OR_DEPARTMENT_OTHER): Payer: Self-pay

## 2023-12-20 ENCOUNTER — Ambulatory Visit (INDEPENDENT_AMBULATORY_CARE_PROVIDER_SITE_OTHER): Admitting: Adult Health

## 2023-12-21 ENCOUNTER — Other Ambulatory Visit (HOSPITAL_BASED_OUTPATIENT_CLINIC_OR_DEPARTMENT_OTHER): Payer: Self-pay

## 2023-12-21 ENCOUNTER — Other Ambulatory Visit: Payer: Self-pay

## 2023-12-21 ENCOUNTER — Encounter (INDEPENDENT_AMBULATORY_CARE_PROVIDER_SITE_OTHER): Payer: Self-pay | Admitting: Adult Health

## 2023-12-21 ENCOUNTER — Other Ambulatory Visit (INDEPENDENT_AMBULATORY_CARE_PROVIDER_SITE_OTHER): Payer: Self-pay | Admitting: Adult Health

## 2023-12-21 ENCOUNTER — Ambulatory Visit (INDEPENDENT_AMBULATORY_CARE_PROVIDER_SITE_OTHER): Admitting: Adult Health

## 2023-12-21 VITALS — BP 106/64 | HR 80 | Temp 98.1°F | Ht 63.0 in | Wt 172.0 lb

## 2023-12-21 DIAGNOSIS — E1159 Type 2 diabetes mellitus with other circulatory complications: Secondary | ICD-10-CM

## 2023-12-21 DIAGNOSIS — Z6830 Body mass index (BMI) 30.0-30.9, adult: Secondary | ICD-10-CM

## 2023-12-21 DIAGNOSIS — Z7985 Long-term (current) use of injectable non-insulin antidiabetic drugs: Secondary | ICD-10-CM

## 2023-12-21 DIAGNOSIS — E1169 Type 2 diabetes mellitus with other specified complication: Secondary | ICD-10-CM | POA: Diagnosis not present

## 2023-12-21 DIAGNOSIS — Z6841 Body Mass Index (BMI) 40.0 and over, adult: Secondary | ICD-10-CM

## 2023-12-21 DIAGNOSIS — I152 Hypertension secondary to endocrine disorders: Secondary | ICD-10-CM

## 2023-12-21 DIAGNOSIS — E669 Obesity, unspecified: Secondary | ICD-10-CM

## 2023-12-21 DIAGNOSIS — E559 Vitamin D deficiency, unspecified: Secondary | ICD-10-CM

## 2023-12-21 DIAGNOSIS — I25118 Atherosclerotic heart disease of native coronary artery with other forms of angina pectoris: Secondary | ICD-10-CM

## 2023-12-21 NOTE — Progress Notes (Signed)
 WEIGHT SUMMARY AND BIOMETRICS  Vitals Temp: 98.1 F (36.7 C) BP: 106/64 Pulse Rate: 80 SpO2: 99 %   Anthropometric Measurements Height: 5\' 3"  (1.6 m) Weight: 172 lb (78 kg) BMI (Calculated): 30.48 Weight at Last Visit: 171 lb Weight Lost Since Last Visit: 0 Weight Gained Since Last Visit: 1 lb Starting Weight: 227 lb Total Weight Loss (lbs): 55 lb (24.9 kg) Peak Weight: 250 lb   Body Composition  Body Fat %: 43.2 % Fat Mass (lbs): 74.4 lbs Muscle Mass (lbs): 92.6 lbs Total Body Water (lbs): 77.6 lbs Visceral Fat Rating : 13   Other Clinical Data Fasting: no Labs: no Today's Visit #: 67 Starting Date: 03/17/20    Chief Complaint:   OBESITY Pamela Love is here to discuss her progress with her obesity treatment plan.  She is on the the Category 1 Plan and states she is following her eating plan approximately 65 % of the time.  She states she is exercising Gym/Walking/Dancing 45/15/60 minutes 2/2/1 times per week.   Interim History:  Reviewed Bioimpedance results with pt: Muscle Mass: +2.2 lbs Adipose Mass: +1.6 lbs  She has been off Insulin  since 04/2023 Current antidiabetic medication is weekly Mounjaro  12.5mg  Denies mass in neck, dysphagia, dyspepsia, persistent hoarseness, abdominal pain, or N/V/C    Exercise-She has been using the Card[facility] Gym at [facility]  Her typical routine at the gym: 1 mile walk on treadmill 6 min stationary bike 5 min elliptical 5 min walking on stairwell Strength training on [employer] machines  Subjective:   1. Type 2 diabetes mellitus with obesity (HCC) Lab Results  Component Value Date   HGBA1C 5.5 09/14/2023   HGBA1C 5.5 06/19/2023   HGBA1C 5.5 03/14/2023    She has been off Insulin  since 04/2023 Current antidiabetic medication is weekly Mounjaro  12.5mg  Denies mass in neck, dysphagia, dyspepsia, persistent hoarseness, abdominal pain, or N/V/C  Home CBG readings: Fasting: 148, 130 PP: 141, 106,  74 She denies sx's of hypoglycemia  2. Hypertension associated with type 2 diabetes mellitus (HCC) BP stable and at goal at OV EPIC review demonstrates BP has been well controlled this year She is able to exercise at higher intensity without difficulty She is currently on aspirin  EC 81 MG tablet  carvedilol  (COREG ) 3.125 MG tablet  ranolazine  (RANEXA ) 1000 MG SR tablet  rosuvastatin  (CRESTOR ) 40 MG tablet  nitroGLYCERIN  (NITROSTAT ) 0.4 MG SL tablet  tirzepatide  (MOUNJARO ) 12.5 MG/0.5ML Pen   Assessment/Plan:   1. Type 2 diabetes mellitus with obesity (HCC) (Primary) Continue Cat 1 MP and regular exercise Continue weekly Mounjaro  3 month supply if much more affordable- does not require refill today  2. Hypertension associated with type 2 diabetes mellitus (HCC) Limit Na+ Continue regular exercise Monitor for sx's of hypotension  5. BMI 30.0-30.9,adult -- Current BMI 30.5  Pamela Love is currently in the action stage of change. As such, her goal is to continue with weight loss efforts. She has agreed to the Category 1 Plan.   Exercise goals: Older adults should follow the adult guidelines. When older adults cannot meet the adult guidelines, they should be as physically active as their abilities and conditions will allow.  Older adults should do exercises that maintain or improve balance if they are at risk of falling.  Older adults should determine their level of effort for physical activity relative to their level of fitness.  Older adults with chronic conditions should understand whether and how their conditions affect their ability to  do regular physical activity safely.  Behavioral modification strategies: increasing lean protein intake, decreasing simple carbohydrates, increasing vegetables, increasing water intake, no skipping meals, meal planning and cooking strategies, keeping healthy foods in the home, ways to avoid boredom eating, and planning for success.  Pamela Love has agreed to  follow-up with our clinic in 3 weeks. She was informed of the importance of frequent follow-up visits to maximize her success with intensive lifestyle modifications for her multiple health conditions.   Check Fasting Labs July 2025  Objective:   Blood pressure 106/64, pulse 80, temperature 98.1 F (36.7 C), height 5\' 3"  (1.6 m), weight 172 lb (78 kg), SpO2 99%. Body mass index is 30.47 kg/m.  General: Cooperative, alert, well developed, in no acute distress. HEENT: Conjunctivae and lids unremarkable. Cardiovascular: Regular rhythm.  Lungs: Normal work of breathing. Neurologic: No focal deficits.   Lab Results  Component Value Date   CREATININE 1.0 07/20/2023   BUN 34 (A) 07/20/2023   NA 139 07/20/2023   K 4.2 07/20/2023   CL 107 07/20/2023   CO2 27 (A) 07/20/2023   Lab Results  Component Value Date   ALT 27 07/20/2023   AST 21 07/20/2023   ALKPHOS 117 07/20/2023   BILITOT 0.5 12/19/2022   Lab Results  Component Value Date   HGBA1C 5.5 09/14/2023   HGBA1C 5.5 06/19/2023   HGBA1C 5.5 03/14/2023   HGBA1C 5.7 (H) 12/19/2022   HGBA1C 5.5 08/30/2022   No results found for: "INSULIN " Lab Results  Component Value Date   TSH 1.71 05/30/2018   Lab Results  Component Value Date   CHOL 143 07/20/2023   HDL 93 (A) 07/20/2023   LDLCALC 75 07/20/2023   TRIG 97 07/20/2023   CHOLHDL 2.9 12/19/2022   Lab Results  Component Value Date   VD25OH 61.9 06/19/2023   VD25OH 77.1 12/19/2022   VD25OH 64.2 06/06/2022   Lab Results  Component Value Date   WBC 7.5 11/13/2021   HGB 14.0 11/13/2021   HCT 41.3 11/13/2021   MCV 93.0 11/13/2021   PLT 243 11/13/2021   No results found for: "IRON", "TIBC", "FERRITIN"  Attestation Statements:   Reviewed by clinician on day of visit: allergies, medications, problem list, medical history, surgical history, family history, social history, and previous encounter notes.  "29 minutes spent face-to-face with the patient discussing  management of disordered eating symptoms, reviewing current medications, and providing strategies for coping with emotional eating."  I have reviewed the above documentation for accuracy and completeness, and I agree with the above. -  Tarun Patchell d. Quinterius Gaida, NP-c

## 2023-12-25 ENCOUNTER — Encounter (INDEPENDENT_AMBULATORY_CARE_PROVIDER_SITE_OTHER): Payer: Self-pay | Admitting: Adult Health

## 2023-12-26 ENCOUNTER — Encounter (HOSPITAL_BASED_OUTPATIENT_CLINIC_OR_DEPARTMENT_OTHER): Payer: Self-pay

## 2023-12-26 ENCOUNTER — Other Ambulatory Visit (HOSPITAL_BASED_OUTPATIENT_CLINIC_OR_DEPARTMENT_OTHER): Payer: Self-pay

## 2023-12-26 ENCOUNTER — Encounter (INDEPENDENT_AMBULATORY_CARE_PROVIDER_SITE_OTHER): Payer: Self-pay | Admitting: Adult Health

## 2023-12-26 ENCOUNTER — Other Ambulatory Visit (INDEPENDENT_AMBULATORY_CARE_PROVIDER_SITE_OTHER): Payer: Self-pay | Admitting: Adult Health

## 2023-12-26 MED ORDER — MOUNJARO 12.5 MG/0.5ML ~~LOC~~ SOAJ
12.5000 mg | SUBCUTANEOUS | 0 refills | Status: DC
  Filled 2023-12-26: qty 6, 84d supply, fill #0

## 2024-01-10 ENCOUNTER — Ambulatory Visit (INDEPENDENT_AMBULATORY_CARE_PROVIDER_SITE_OTHER): Admitting: Family Medicine

## 2024-01-10 ENCOUNTER — Encounter (INDEPENDENT_AMBULATORY_CARE_PROVIDER_SITE_OTHER): Payer: Self-pay | Admitting: Family Medicine

## 2024-01-10 VITALS — BP 116/71 | HR 76 | Temp 98.1°F | Ht 63.0 in | Wt 171.0 lb

## 2024-01-10 DIAGNOSIS — I25118 Atherosclerotic heart disease of native coronary artery with other forms of angina pectoris: Secondary | ICD-10-CM | POA: Diagnosis not present

## 2024-01-10 DIAGNOSIS — Z7985 Long-term (current) use of injectable non-insulin antidiabetic drugs: Secondary | ICD-10-CM

## 2024-01-10 DIAGNOSIS — Z6841 Body Mass Index (BMI) 40.0 and over, adult: Secondary | ICD-10-CM

## 2024-01-10 DIAGNOSIS — I152 Hypertension secondary to endocrine disorders: Secondary | ICD-10-CM | POA: Diagnosis not present

## 2024-01-10 DIAGNOSIS — E669 Obesity, unspecified: Secondary | ICD-10-CM | POA: Diagnosis not present

## 2024-01-10 DIAGNOSIS — E1159 Type 2 diabetes mellitus with other circulatory complications: Secondary | ICD-10-CM

## 2024-01-10 DIAGNOSIS — E1169 Type 2 diabetes mellitus with other specified complication: Secondary | ICD-10-CM | POA: Diagnosis not present

## 2024-01-10 DIAGNOSIS — E559 Vitamin D deficiency, unspecified: Secondary | ICD-10-CM

## 2024-01-10 DIAGNOSIS — Z6830 Body mass index (BMI) 30.0-30.9, adult: Secondary | ICD-10-CM

## 2024-01-10 NOTE — Progress Notes (Incomplete)
 Pamela Love, D.O.  ABFM, ABOM Specializing in Clinical Bariatric Medicine  Office located at: [address] [place], [place]  [address]   Assessment and Plan:   Orders Placed This Encounter  Procedures   VITAMIN D  25 Hydroxy (Vit-D Deficiency, Fractures)   Hemoglobin A1c   Comprehensive metabolic panel with GFR   Vitamin B12   TSH   T4, free    There are no discontinued medications.   No orders of the defined types were placed in this encounter.    Labs obtained today (CMP w GFR, A1c, free T4, TSH, Vit B12, Vit D) will be reviewed at next OV.    FOR THE DISEASE OF OBESITY: BMI 30.0-30.9,adult -- Current BMI 30.3 Obesity - Starting BMI 40.22 date 03/17/20 Assessment & Plan: Since last office visit with Napoleon Backer NP on 12/21/23 patient's muscle mass has decreased by 1.4 lbs. Fat mass has increased by 1.4 lbs. Total body water has decreased by 1.2 lbs.  Counseling done on how various foods will affect these numbers and how to maximize success  Total lbs lost to date: 56 lbs Total weight loss percentage to date: -24.67   Recommended Dietary Goals Pamela Love is currently in the action stage of change. As such, her goal is to continue weight management plan.  She has agreed to: continue current plan -- Category 1 Plan   Behavioral Intervention We discussed the following today: increasing lean protein intake to established goals, increasing water intake , and continue to work on maintaining a reduced calorie state, getting the recommended amount of protein, incorporating whole foods, making healthy choices, staying well hydrated and practicing mindfulness when eating.  Additional resources provided today: Handout on increasing daily activity and exercise goal setting and Handout on benefits of 5-10% weight loss and AHOY exercise program handout.   Evidence-based interventions for health behavior change were utilized today including the discussion of self monitoring  techniques, problem-solving barriers and SMART goal setting techniques.   Regarding patient's less desirable eating habits and patterns, we employed the technique of small changes.   Pt will specifically work on: Increase resistance/strength training and exercise routine (2 sets of 10) for next visit.    Recommended Physical Activity Goals Pamela Love has been advised to work up to 300-450 minutes of moderate intensity aerobic activity a week and strengthening exercises 2-3 times per week for cardiovascular health, weight loss maintenance and preservation of muscle mass.   She has agreed to:  Increase resistance/strength training as able. Patient states she cannot do resistance bands, but is able to do two sets of 10 with weights.   Pharmacotherapy We both agreed to: Continue with current nutritional and behavioral strategies   ASSOCIATED CONDITIONS ADDRESSED TODAY: Type 2 diabetes mellitus with obesity (HCC) Assessment & Plan: Lab Results  Component Value Date   HGBA1C 5.5 09/14/2023   HGBA1C 5.5 06/19/2023   HGBA1C 5.5 03/14/2023    Last obtained labs showed serum creatinine of 0.99. Compliant with Mounjaro  12.5 mg once weekly. Tolerating well, no SE. She reports she without Mounjaro  recently for about 10 days, stating she was unable to get her refill from the pharmacy. She has since resumed Mounjaro . Occasional blood sugars readings in the mid 80s before bed, about 2 hours after dinner.   Continue current med regimen. No refill required today. Reviewed lean protein sources and benefits of protein with stabilizing sugars. Encourage patient to continue exercise routine and consider increasing resistance training. Will recheck A1c today;  results to be reviewed at next OV.  Orders: - Recheck A1c   Hypertension associated with type 2 diabetes mellitus [facility]) Assessment & Plan: BP Readings from Last 3 Encounters:  01/10/24 116/71  12/21/23 106/64  12/05/23 124/76   Compliant with Coreg   3.125 mg BID. Tolerating well, no side effects. PC has been working on [employer] and exercise. No acute concerns today. Continue with antihypertensive regimen as prescribed. Continue with heart healthy meal plan and regular exercise. Recheck CMP with GFR today; results to be reviewed at next OV.  Orders: - Recheck CMP w GFR   Vitamin D  deficiency Assessment & Plan: Lab Results  Component Value Date   VD25OH 61.9 06/19/2023   VD25OH 77.1 12/19/2022   VD25OH 64.2 06/06/2022   Pt is taking OTC vitamin D  2000 units once daily. Good compliance and intolerance with no adverse side effects reported. Continue vitamin D  supplementation with no changes made today. Recheck vit D today and review results at next OV.  Orders: - Recheck Vit D   Coronary artery disease involving native coronary artery of native heart with other form of angina pectoris (HCC) Assessment & Plan: Relevant medications: Aspirin  81 mg daily, Plavix  75 mg daily, Nitroglycerin  0.4 mg prn for chest pain, fish oil 1,400 mg BID, Ranexa  1000 BID, Crestor  40 mg daily. Good compliance and tolerance. Condition is overall stable, no acute concerns.Continue with current med regimen as prescribed. Continue working on following heart healthy meal plan and regular exercise. Recheck free T4, TSH, and vit B12; results to be reviewed at next OV.  Orders: - Recheck free T4, TSH, Vit B12   Follow up:   Return in about 3 weeks (around 01/31/2024) for 3 week f/u. She was informed of the importance of frequent follow up visits to maximize her success with intensive lifestyle modifications for her multiple health conditions.  Subjective:   Chief complaint: Obesity Pamela Love is here to discuss her progress with her obesity treatment plan. She is on the Category 1 Plan and states she is following her eating plan approximately 75% of the time. She states she is cardio (walking, aerobics, etc.) 60 minutes 4 days and strength training about 45  minutes 2 days a week (mostly arms w 2 lb weight).  Interval History:  Pamela Love is here for a follow up office visit. Since last OV with Napoleon Backer NP on 12/21/23, she has lost 1 lb. She reports she put her best efforts towards prioritizing eating more proteins, less sweets, and exercise more frequently. She occasionally feels sluggish or fatigued.   Pharmacotherapy for weight loss: She is currently taking Monjauro with diabetes as the primary indication with adequate clinical response  and without side effects..   Review of Systems:  Pertinent positives were addressed with patient today.  Reviewed by clinician on day of visit: allergies, medications, problem list, medical history, surgical history, family history, social history, and previous encounter notes.  Weight Summary and Biometrics   Weight Lost Since Last Visit: 1lb  Weight Gained Since Last Visit: 0lb    Vitals Temp: 98.1 F (36.7 C) BP: 116/71 Pulse Rate: 76 SpO2: 99 %   Anthropometric Measurements Height: 5' 3 (1.6 m) Weight: 171 lb (77.6 kg) BMI (Calculated): 30.3 Weight at Last Visit: 172lb Weight Lost Since Last Visit: 1lb Weight Gained Since Last Visit: 0lb Starting Weight: 227lb Total Weight Loss (lbs): 56 lb (25.4 kg) Peak Weight: 250lb   Body Composition  Body Fat %: 44.1 % Fat  Mass (lbs): 75.8 lbs Muscle Mass (lbs): 91.2 lbs Total Body Water (lbs): 78.4 lbs Visceral Fat Rating : 13   Other Clinical Data Fasting: No Labs: No Today's Visit #: 79 Starting Date: 03/17/20    Objective:   PHYSICAL EXAM: Blood pressure 116/71, pulse 76, temperature 98.1 F (36.7 C), height 5' 3 (1.6 m), weight 171 lb (77.6 kg), SpO2 99%. Body mass index is 30.29 kg/m.  General: she is overweight, cooperative and in no acute distress. PSYCH: Has normal mood, affect and thought process.   HEENT: EOMI, sclerae are anicteric. Lungs: Normal breathing effort, no conversational  dyspnea. Extremities: Moves * 4 Neurologic: A and O * 3, good insight  DIAGNOSTIC DATA REVIEWED: BMET    Component Value Date/Time   NA 139 07/20/2023 0000   K 4.2 07/20/2023 0000   CL 107 07/20/2023 0000   CO2 27 (A) 07/20/2023 0000   GLUCOSE 88 07/18/2023 1357   GLUCOSE 92 03/14/2023 1413   BUN 34 (A) 07/20/2023 0000   CREATININE 1.0 07/20/2023 0000   CREATININE 1.03 (H) 07/18/2023 1357   CALCIUM  9.8 07/20/2023 0000   GFRNONAA 54 (L) 11/13/2021 1933   GFRAA 54 (L) 05/12/2020 1557   Lab Results  Component Value Date   HGBA1C 5.5 09/14/2023   HGBA1C 6 1 02/09/2015   No results found for: INSULIN  Lab Results  Component Value Date   TSH 1.71 05/30/2018   CBC    Component Value Date/Time   WBC 7.5 11/13/2021 1933   RBC 4.44 11/13/2021 1933   HGB 14.0 11/13/2021 1933   HGB 13.8 01/26/2021 1427   HCT 41.3 11/13/2021 1933   HCT 41.6 01/26/2021 1427   PLT 243 11/13/2021 1933   PLT 229 01/26/2021 1427   MCV 93.0 11/13/2021 1933   MCV 92 01/26/2021 1427   MCH 31.5 11/13/2021 1933   MCHC 33.9 11/13/2021 1933   RDW 12.1 11/13/2021 1933   RDW 13.2 01/26/2021 1427   Iron Studies No results found for: IRON, TIBC, FERRITIN, IRONPCTSAT Lipid Panel     Component Value Date/Time   CHOL 143 07/20/2023 0000   CHOL 133 12/19/2022 1435   TRIG 97 07/20/2023 0000   HDL 93 (A) 07/20/2023 0000   HDL 46 12/19/2022 1435   CHOLHDL 2.9 12/19/2022 1435   LDLCALC 75 07/20/2023 0000   LDLCALC 67 12/19/2022 1435   Hepatic Function Panel     Component Value Date/Time   PROT 6.7 12/19/2022 1435   ALBUMIN 4.3 07/20/2023 0000   ALBUMIN 4.4 12/19/2022 1435   AST 21 07/20/2023 0000   ALT 27 07/20/2023 0000   ALKPHOS 117 07/20/2023 0000   BILITOT 0.5 12/19/2022 1435      Component Value Date/Time   TSH 1.71 05/30/2018 0000   Nutritional Lab Results  Component Value Date   VD25OH 61.9 06/19/2023   VD25OH 77.1 12/19/2022   VD25OH 64.2 06/06/2022    Attestations:    I, Bernita Bristle, acting as a Stage manager for Marceil Sensor, DO., have compiled all relevant documentation for today's office visit on behalf of Marceil Sensor, DO, while in the presence of [employer], DO.  Reviewed by clinician on day of visit: allergies, medications, problem list, medical history, surgical history, family history, social history, and previous encounter notes pertinent to patient's obesity diagnosis.  I have reviewed the above documentation for accuracy and completeness, and I agree with the above. Pamela Love, D.O.  The 21st Century Cures Act was signed into law  in 2016 which includes the topic of electronic health records.  This provides immediate access to information in MyChart.  This includes consultation notes, operative notes, office notes, lab results and pathology reports.  If you have any questions about what you read please let us  know at your next visit so we can discuss your concerns and take corrective action if need be.  We are right here with you.

## 2024-01-11 LAB — COMPREHENSIVE METABOLIC PANEL WITH GFR
ALT: 30 IU/L (ref 0–32)
AST: 24 IU/L (ref 0–40)
Albumin: 4.5 g/dL (ref 3.8–4.8)
Alkaline Phosphatase: 108 IU/L (ref 44–121)
BUN/Creatinine Ratio: 31 — ABNORMAL HIGH (ref 12–28)
BUN: 27 mg/dL (ref 8–27)
Bilirubin Total: 0.5 mg/dL (ref 0.0–1.2)
CO2: 18 mmol/L — ABNORMAL LOW (ref 20–29)
Calcium: 9.7 mg/dL (ref 8.7–10.3)
Chloride: 106 mmol/L (ref 96–106)
Creatinine, Ser: 0.87 mg/dL (ref 0.57–1.00)
Globulin, Total: 2 g/dL (ref 1.5–4.5)
Glucose: 86 mg/dL (ref 70–99)
Potassium: 4.3 mmol/L (ref 3.5–5.2)
Sodium: 141 mmol/L (ref 134–144)
Total Protein: 6.5 g/dL (ref 6.0–8.5)
eGFR: 69 mL/min/{1.73_m2} (ref >59)

## 2024-01-11 LAB — VITAMIN B12: Vitamin B-12: 907 pg/mL (ref 232–1245)

## 2024-01-11 LAB — T4, FREE: Free T4: 1.18 ng/dL (ref 0.82–1.77)

## 2024-01-11 LAB — TSH: TSH: 2.49 u[IU]/mL (ref 0.450–4.500)

## 2024-01-11 LAB — HEMOGLOBIN A1C
Est. average glucose Bld gHb Est-mCnc: 114 mg/dL
Hgb A1c MFr Bld: 5.6 % (ref 4.8–5.6)

## 2024-01-11 LAB — VITAMIN D 25 HYDROXY (VIT D DEFICIENCY, FRACTURES): Vit D, 25-Hydroxy: 54.7 ng/mL (ref 30.0–100.0)

## 2024-01-15 ENCOUNTER — Encounter (HOSPITAL_COMMUNITY): Payer: Self-pay

## 2024-01-15 ENCOUNTER — Other Ambulatory Visit: Payer: Self-pay

## 2024-01-15 ENCOUNTER — Inpatient Hospital Stay (HOSPITAL_COMMUNITY)
Admission: EM | Admit: 2024-01-15 | Discharge: 2024-01-16 | DRG: 282 | Disposition: A | Discharge status: Home / Self Care | Attending: Internal Medicine | Admitting: Internal Medicine

## 2024-01-15 ENCOUNTER — Emergency Department (HOSPITAL_COMMUNITY)

## 2024-01-15 DIAGNOSIS — I2510 Atherosclerotic heart disease of native coronary artery without angina pectoris: Secondary | ICD-10-CM | POA: Diagnosis not present

## 2024-01-15 DIAGNOSIS — I10 Essential (primary) hypertension: Secondary | ICD-10-CM | POA: Diagnosis not present

## 2024-01-15 DIAGNOSIS — Z888 Allergy status to other drugs, medicaments and biological substances status: Secondary | ICD-10-CM

## 2024-01-15 DIAGNOSIS — G4733 Obstructive sleep apnea (adult) (pediatric): Secondary | ICD-10-CM | POA: Diagnosis present

## 2024-01-15 DIAGNOSIS — Z85828 Personal history of other malignant neoplasm of skin: Secondary | ICD-10-CM

## 2024-01-15 DIAGNOSIS — Z7982 Long term (current) use of aspirin: Secondary | ICD-10-CM | POA: Diagnosis not present

## 2024-01-15 DIAGNOSIS — Z7985 Long-term (current) use of injectable non-insulin antidiabetic drugs: Secondary | ICD-10-CM

## 2024-01-15 DIAGNOSIS — E66811 Obesity, class 1: Secondary | ICD-10-CM | POA: Diagnosis present

## 2024-01-15 DIAGNOSIS — E1122 Type 2 diabetes mellitus with diabetic chronic kidney disease: Secondary | ICD-10-CM | POA: Diagnosis not present

## 2024-01-15 DIAGNOSIS — E119 Type 2 diabetes mellitus without complications: Secondary | ICD-10-CM

## 2024-01-15 DIAGNOSIS — K219 Gastro-esophageal reflux disease without esophagitis: Secondary | ICD-10-CM | POA: Diagnosis not present

## 2024-01-15 DIAGNOSIS — Z8249 Family history of ischemic heart disease and other diseases of the circulatory system: Secondary | ICD-10-CM

## 2024-01-15 DIAGNOSIS — I4510 Unspecified right bundle-branch block: Secondary | ICD-10-CM | POA: Diagnosis present

## 2024-01-15 DIAGNOSIS — I160 Hypertensive urgency: Secondary | ICD-10-CM | POA: Diagnosis not present

## 2024-01-15 DIAGNOSIS — R7989 Other specified abnormal findings of blood chemistry: Secondary | ICD-10-CM | POA: Diagnosis not present

## 2024-01-15 DIAGNOSIS — Z833 Family history of diabetes mellitus: Secondary | ICD-10-CM

## 2024-01-15 DIAGNOSIS — Z8349 Family history of other endocrine, nutritional and metabolic diseases: Secondary | ICD-10-CM

## 2024-01-15 DIAGNOSIS — Z886 Allergy status to analgesic agent status: Secondary | ICD-10-CM | POA: Diagnosis not present

## 2024-01-15 DIAGNOSIS — Z803 Family history of malignant neoplasm of breast: Secondary | ICD-10-CM

## 2024-01-15 DIAGNOSIS — E7800 Pure hypercholesterolemia, unspecified: Secondary | ICD-10-CM | POA: Diagnosis present

## 2024-01-15 DIAGNOSIS — Z79899 Other long term (current) drug therapy: Secondary | ICD-10-CM

## 2024-01-15 DIAGNOSIS — Z809 Family history of malignant neoplasm, unspecified: Secondary | ICD-10-CM

## 2024-01-15 DIAGNOSIS — Z955 Presence of coronary angioplasty implant and graft: Secondary | ICD-10-CM

## 2024-01-15 DIAGNOSIS — E782 Mixed hyperlipidemia: Secondary | ICD-10-CM | POA: Diagnosis present

## 2024-01-15 DIAGNOSIS — Z96653 Presence of artificial knee joint, bilateral: Secondary | ICD-10-CM | POA: Diagnosis not present

## 2024-01-15 DIAGNOSIS — R0602 Shortness of breath: Secondary | ICD-10-CM | POA: Diagnosis not present

## 2024-01-15 DIAGNOSIS — I214 Non-ST elevation (NSTEMI) myocardial infarction: Secondary | ICD-10-CM | POA: Diagnosis not present

## 2024-01-15 DIAGNOSIS — Z882 Allergy status to sulfonamides status: Secondary | ICD-10-CM

## 2024-01-15 DIAGNOSIS — R0989 Other specified symptoms and signs involving the circulatory and respiratory systems: Secondary | ICD-10-CM | POA: Diagnosis not present

## 2024-01-15 DIAGNOSIS — N183 Chronic kidney disease, stage 3 unspecified: Secondary | ICD-10-CM | POA: Diagnosis present

## 2024-01-15 DIAGNOSIS — E785 Hyperlipidemia, unspecified: Secondary | ICD-10-CM | POA: Diagnosis present

## 2024-01-15 DIAGNOSIS — E669 Obesity, unspecified: Secondary | ICD-10-CM | POA: Diagnosis present

## 2024-01-15 DIAGNOSIS — I129 Hypertensive chronic kidney disease with stage 1 through stage 4 chronic kidney disease, or unspecified chronic kidney disease: Secondary | ICD-10-CM | POA: Diagnosis present

## 2024-01-15 DIAGNOSIS — Z6830 Body mass index (BMI) 30.0-30.9, adult: Secondary | ICD-10-CM

## 2024-01-15 DIAGNOSIS — Z7902 Long term (current) use of antithrombotics/antiplatelets: Secondary | ICD-10-CM | POA: Diagnosis not present

## 2024-01-15 DIAGNOSIS — M810 Age-related osteoporosis without current pathological fracture: Secondary | ICD-10-CM | POA: Diagnosis present

## 2024-01-15 DIAGNOSIS — I21A1 Myocardial infarction type 2: Principal | ICD-10-CM | POA: Insufficient documentation

## 2024-01-15 LAB — BASIC METABOLIC PANEL WITH GFR
Anion gap: 8 (ref 5–15)
BUN: 29 mg/dL — ABNORMAL HIGH (ref 8–23)
CO2: 22 mmol/L (ref 22–32)
Calcium: 9.6 mg/dL (ref 8.9–10.3)
Chloride: 108 mmol/L (ref 98–111)
Creatinine, Ser: 0.85 mg/dL (ref 0.44–1.00)
GFR, Estimated: >60 mL/min (ref >60)
Glucose, Bld: 138 mg/dL — ABNORMAL HIGH (ref 70–99)
Potassium: 4.3 mmol/L (ref 3.5–5.1)
Sodium: 138 mmol/L (ref 135–145)

## 2024-01-15 LAB — CBC
HCT: 42.6 % (ref 36.0–46.0)
Hemoglobin: 14.4 g/dL (ref 12.0–15.0)
MCH: 32.8 pg (ref 26.0–34.0)
MCHC: 33.8 g/dL (ref 30.0–36.0)
MCV: 97 fL (ref 80.0–100.0)
Platelets: 187 10*3/uL (ref 150–400)
RBC: 4.39 MIL/uL (ref 3.87–5.11)
RDW: 11.8 % (ref 11.5–15.5)
WBC: 5.7 10*3/uL (ref 4.0–10.5)
nRBC: 0 % (ref 0.0–0.2)

## 2024-01-15 LAB — BRAIN NATRIURETIC PEPTIDE: B Natriuretic Peptide: 21.4 pg/mL (ref 0.0–100.0)

## 2024-01-15 LAB — D-DIMER, QUANTITATIVE: D-Dimer, Quant: <0.27 ug{FEU}/mL (ref 0.00–0.50)

## 2024-01-15 LAB — TROPONIN I (HIGH SENSITIVITY)
Troponin I (High Sensitivity): 4 ng/L (ref <18)
Troponin I (High Sensitivity): 277 ng/L (ref <18)

## 2024-01-15 MED ORDER — HEPARIN BOLUS VIA INFUSION
4000.0000 [IU] | Freq: Once | INTRAVENOUS | Status: AC
  Administered 2024-01-15: 4000 [IU] via INTRAVENOUS
  Filled 2024-01-15: qty 4000

## 2024-01-15 MED ORDER — HEPARIN (PORCINE) 25000 UT/250ML-% IV SOLN
950.0000 [IU]/h | INTRAVENOUS | Status: DC
  Administered 2024-01-15: 850 [IU]/h via INTRAVENOUS
  Filled 2024-01-15: qty 250

## 2024-01-15 MED ORDER — ASPIRIN 81 MG PO CHEW
324.0000 mg | CHEWABLE_TABLET | Freq: Once | ORAL | Status: AC
  Administered 2024-01-15: 324 mg via ORAL
  Filled 2024-01-15: qty 4

## 2024-01-15 NOTE — ED Notes (Signed)
 Pt came back from bathroom.  Restarted the heparin  drip and assisted pt to be more comfortable.  Provided a belongings bag for patient to place her blue pants, shoes and white watch (in the shoe). Pt asked when she was going for surgery tomorrow.  Notified the primary nurse of pt questions

## 2024-01-15 NOTE — Consult Note (Signed)
 CARDIOLOGY CONSULT NOTE    Patient ID: Pamela Love; [id number]; Dec 08, 1950   Admit date: 01/15/2024 Date of Consult: 01/15/2024  Primary Care Provider: Glena Landau, MD Primary Cardiologist:   Primary Electrophysiologist:     History of Present Illness:   [name] is a 73 year old F known to have CAD S/P RCA PCI in 2021 with residual moderate LAD disease, HTN, DM 2, OSA, HLD presenting to the ER with acute shortness of breath since this morning and not feeling well.  Associated with nausea.  No angina.  She had chest pains in December of last year that were resolved with sublingual nitroglycerin  as well as Pepcid , her chest pains were deemed to be noncardiac in nature as they were completely resolved with Pepcid .  She did not have any recurrence of chest pains since then.  She reported that she never had any chest pains even prior to her RCA PCI.  Does not have any other symptoms of dizziness, syncope, leg swelling.  Past Medical History:  Diagnosis Date   Abnormal nuclear stress test 01/02/2020   Arthritis    Bilateral leg edema 01/02/2020   Bursitis    hips   C. difficile colitis 12/2017   CAD (coronary artery disease) 01/06/2020   CKD (chronic kidney disease), stage III (HCC)    Complication of anesthesia    spinal did not work with second c section   Diabetes mellitus (HCC) 11/19/2015   Diabetes mellitus, type 2 (HCC)    diet controlled - has Rx for Glymipride but has not started taking yet   Dyslipidemia 10/21/2014   GERD (gastroesophageal reflux disease)    History of nonmelanoma skin cancer    History of skin cancer    Hypercholesterolemia 12/11/2019   Hyperlipidemia    Hypertension    Hypertensive disorder 10/21/2014   Lower extremity edema    Mixed hyperlipidemia 01/02/2020   OAB (overactive bladder)    Obesity (BMI 30-39.9) 05/12/2020   OSA (obstructive sleep apnea) 05/12/2020   Osteoarthritis of right knee 05/15/2015   Osteoarthrosis, unspecified whether  generalized or localized, involving lower leg 10/21/2014   Osteoporosis    Over weight    Primary osteoarthritis of left knee 12/18/2014   S/P knee replacement 12/18/2014   S/P total knee replacement using cement 05/15/2015   Sleep apnea    Tachycardia 12/11/2019    Past Surgical History:  Procedure Laterality Date   ABDOMINAL HYSTERECTOMY  2005   CATARACT EXTRACTION     CESAREAN SECTION     x2   colonscopy      CORONARY PRESSURE/FFR STUDY N/A 01/06/2020   Procedure: INTRAVASCULAR PRESSURE WIRE/FFR STUDY;  Surgeon: Avanell Leigh, MD;  Location: [facility];  Service: Cardiovascular;  Laterality: N/A;   CORONARY STENT INTERVENTION N/A 01/06/2020   Procedure: CORONARY STENT INTERVENTION;  Surgeon: Avanell Leigh, MD;  Location: [facility];  Service: Cardiovascular;  Laterality: N/A;   EYE SURGERY     bilat cataract surgery 2015   LEFT HEART CATH AND CORONARY ANGIOGRAPHY N/A 01/06/2020   Procedure: LEFT HEART CATH AND CORONARY ANGIOGRAPHY;  Surgeon: Avanell Leigh, MD;  Location: [facility];  Service: Cardiovascular;  Laterality: N/A;   LEFT HEART CATH AND CORONARY ANGIOGRAPHY N/A 01/29/2021   Procedure: LEFT HEART CATH AND CORONARY ANGIOGRAPHY;  Surgeon: Lucendia Rusk, MD;  Location: [facility];  Service: Cardiovascular;  Laterality: N/A;   TOTAL KNEE ARTHROPLASTY Left 12/18/2014  Procedure: TOTAL LEFT KNEE ARTHROPLASTY;  Surgeon: Genevie Kerns, MD;  Location: [facility];  Service: Orthopedics;  Laterality: Left;   TOTAL KNEE ARTHROPLASTY Right 05/15/2015   Procedure: RIGHT TOTAL KNEE ARTHROPLASTY;  Surgeon: Genevie Kerns, MD;  Location: [facility];  Service: Orthopedics;  Laterality: Right;   UTERINE FIBROID SURGERY         Inpatient Medications: Scheduled Meds:  Continuous Infusions:  heparin  850 Units/hr (01/15/24 2000)   PRN Meds:   Allergies:    Allergies  Allergen Reactions   Amoxicillin-Pot Clavulanate     Other reaction(s): vomiting    Aspirin      Other reaction(s): reflux and nervousness   Colchicine     Stomach cramps Other reaction(s): severe GI upset   Diclofenac     Oral causes stomach cramps   Dulaglutide Diarrhea and Nausea And Vomiting    Other reaction(s): diarrhea   Empagliflozin      Other reaction(s): yeast infection   Ezetimibe     Muscle pain Other reaction(s): muscle aches   Imdur  [Isosorbide  Nitrate] Other (See Comments)    hypotensive   Isosorbide  Other (See Comments)    Causes Hypotension   Metformin      Other reaction(s): stomach upset   Repaglinide      Other reaction(s): swelling and GI upset   Sitagliptin  Phos-Metformin  Hcl Nausea And Vomiting    Lightheaded  Other reaction(s): GI upset   Sulfamethoxazole-Trimethoprim Itching    Other reaction(s): itching   Imipramine Rash   Tape Rash    Paper tape ok to use     Social History:   Social History   Socioeconomic History   Marital status: Married    Spouse name: Not on file   Number of children: Not on file   Years of education: Not on file   Highest education level: Not on file  Occupational History   Occupation: retired  Tobacco Use   Smoking status: Never   Smokeless tobacco: Never  Vaping Use   Vaping status: Never Used  Substance and Sexual Activity   Alcohol use: Yes    Comment: rare   Drug use: No   Sexual activity: Not Currently    Partners: Male  Other Topics Concern   Not on file  Social History Narrative   Not on file   Social Drivers of Health   Financial Resource Strain: Not on file  Food Insecurity: Not on file  Transportation Needs: Not on file  Physical Activity: Not on file  Stress: Not on file  Social Connections: Not on file  Intimate Partner Violence: Not on file    Family History:    Family History  Problem Relation Age of Onset   Heart disease Mother    Hypertension Mother    Hyperlipidemia Mother    Cancer Mother    Heart attack Father    Hypertension Father    Hyperlipidemia  Father    Heart disease Father    Sudden death Father    Sleep apnea Father    Heart attack Sister    Heart attack Paternal Uncle    Breast cancer Maternal Grandmother 68   Diabetes Maternal Grandfather    Heart attack Maternal Grandfather    Congenital heart disease Son    BRCA 1/2 Neg Hx      ROS:  Please see the history of present illness.  ROS  All other ROS reviewed and negative.     Physical Exam/Data:   Vitals:   01/15/24 1330 01/15/24  1549 01/15/24 1854 01/15/24 1915  BP:  (!) 143/92 (!) 167/81 (!) 166/86  Pulse:  81 70 72  Resp:  18 14 16   Temp:  98.4 F (36.9 C) 98.1 F (36.7 C)   TempSrc:   Oral   SpO2:  100% 100% 100%  Weight: 78 kg     Height: 5' 3 (1.6 m)      No intake or output data in the 24 hours ending 01/15/24 2025 Filed Weights   01/15/24 1330  Weight: 78 kg   Body mass index is 30.47 kg/m.  General:  Well nourished, well developed, in no acute distress HEENT: normal Lymph: no adenopathy Neck: no JVD Endocrine:  No thryomegaly Vascular: No carotid bruits; FA pulses 2+ bilaterally without bruits  Cardiac:  normal S1, S2; RRR; no murmur  Lungs:  clear to auscultation bilaterally, no wheezing, rhonchi or rales  Abd: soft, nontender, no hepatomegaly  Ext: no edema Musculoskeletal:  No deformities, BUE and BLE strength normal and equal Skin: warm and dry  Neuro:  CNs 2-12 intact, no focal abnormalities noted Psych:  Normal affect   EKG:  The EKG was personally reviewed and demonstrates: NSR. Telemetry:  Telemetry was personally reviewed and demonstrates:  NSR.   Laboratory Data:  Chemistry Recent Labs  Lab 01/10/24 1523 01/15/24 1332  NA 141 138  K 4.3 4.3  CL 106 108  CO2 18* 22  GLUCOSE 86 138*  BUN 27 29*  CREATININE 0.87 0.85  CALCIUM  9.7 9.6  GFRNONAA  --  >60  ANIONGAP  --  8    Recent Labs  Lab 01/10/24 1523  PROT 6.5  ALBUMIN 4.5  AST 24  ALT 30  ALKPHOS 108  BILITOT 0.5   Hematology Recent Labs  Lab  01/15/24 1332  WBC 5.7  RBC 4.39  HGB 14.4  HCT 42.6  MCV 97.0  MCH 32.8  MCHC 33.8  RDW 11.8  PLT 187   Cardiac EnzymesNo results for input(s): TROPONINI in the last 168 hours. No results for input(s): TROPIPOC in the last 168 hours.  BNP Recent Labs  Lab 01/15/24 1332  BNP 21.4    DDimer No results for input(s): DDIMER in the last 168 hours.  Radiology/Studies:  DG Chest 1 View Result Date: 01/15/2024 CLINICAL DATA:  Shortness of breath. EXAM: CHEST  1 VIEW COMPARISON:  03/16/2005. FINDINGS: Low lung volumes. The cardiomediastinal silhouette is otherwise within normal limits. No focal consolidation, pleural effusion, or pneumothorax. Diffuse osseous demineralization. No acute osseous abnormality identified. IMPRESSION: No acute cardiopulmonary findings. Electronically Signed   By: Mannie Seek M.D.   On: 01/15/2024 14:20    Assessment and Plan:   NSTEMI: Presented with acute DOE since this morning and not feeling well.  Associated with nausea.  Denied having angina. EKG showed NSR, RBBB and PACs. Hs troponins elevated,4>>277.  Start ACS protocol.  Keep n.p.o. after midnight for [facility] tomorrow.  She had a prior history of RCA PCI in 2021 with residual moderate proximal to mid LAD disease.  Cardiac risk factors include HTN, diabetes mellitus type 2.  Obtain echocardiogram.  Informed consent for LHC Risks and benefits of cardiac catheterization have been discussed with the patient.  These include bleeding, infection, kidney damage, stroke, heart attack, death.  The patient understands these risks and is willing to proceed.  HTN, poorly controlled: Resume home carvedilol  to keep BP less than 130/80 mmHg.     For questions or updates, please contact [facility] Please  consult [email or web address] for contact info under Cardiology/STEMI.   Signed, Lakenya Riendeau Priya Trae Bovenzi, MD 01/15/2024 8:25 PM

## 2024-01-15 NOTE — H&P (View-Only) (Signed)
 CARDIOLOGY CONSULT NOTE    Patient ID: Pamela Love; [id number]; Dec 08, 1950   Admit date: 01/15/2024 Date of Consult: 01/15/2024  Primary Care Provider: Glena Landau, MD Primary Cardiologist:   Primary Electrophysiologist:     History of Present Illness:   Pamela Love is a 73 year old F known to have CAD S/P RCA PCI in 2021 with residual moderate LAD disease, HTN, DM 2, OSA, HLD presenting to the ER with acute shortness of breath since this morning and not feeling well.  Associated with nausea.  No angina.  She had chest pains in December of last year that were resolved with sublingual nitroglycerin  as well as Pepcid , her chest pains were deemed to be noncardiac in nature as they were completely resolved with Pepcid .  She did not have any recurrence of chest pains since then.  She reported that she never had any chest pains even prior to her RCA PCI.  Does not have any other symptoms of dizziness, syncope, leg swelling.  Past Medical History:  Diagnosis Date   Abnormal nuclear stress test 01/02/2020   Arthritis    Bilateral leg edema 01/02/2020   Bursitis    hips   C. difficile colitis 12/2017   CAD (coronary artery disease) 01/06/2020   CKD (chronic kidney disease), stage III (HCC)    Complication of anesthesia    spinal did not work with second c section   Diabetes mellitus (HCC) 11/19/2015   Diabetes mellitus, type 2 (HCC)    diet controlled - has Rx for Glymipride but has not started taking yet   Dyslipidemia 10/21/2014   GERD (gastroesophageal reflux disease)    History of nonmelanoma skin cancer    History of skin cancer    Hypercholesterolemia 12/11/2019   Hyperlipidemia    Hypertension    Hypertensive disorder 10/21/2014   Lower extremity edema    Mixed hyperlipidemia 01/02/2020   OAB (overactive bladder)    Obesity (BMI 30-39.9) 05/12/2020   OSA (obstructive sleep apnea) 05/12/2020   Osteoarthritis of right knee 05/15/2015   Osteoarthrosis, unspecified whether  generalized or localized, involving lower leg 10/21/2014   Osteoporosis    Over weight    Primary osteoarthritis of left knee 12/18/2014   S/P knee replacement 12/18/2014   S/P total knee replacement using cement 05/15/2015   Sleep apnea    Tachycardia 12/11/2019    Past Surgical History:  Procedure Laterality Date   ABDOMINAL HYSTERECTOMY  2005   CATARACT EXTRACTION     CESAREAN SECTION     x2   colonscopy      CORONARY PRESSURE/FFR STUDY N/A 01/06/2020   Procedure: INTRAVASCULAR PRESSURE WIRE/FFR STUDY;  Surgeon: Avanell Leigh, MD;  Location: [facility];  Service: Cardiovascular;  Laterality: N/A;   CORONARY STENT INTERVENTION N/A 01/06/2020   Procedure: CORONARY STENT INTERVENTION;  Surgeon: Avanell Leigh, MD;  Location: [facility];  Service: Cardiovascular;  Laterality: N/A;   EYE SURGERY     bilat cataract surgery 2015   LEFT HEART CATH AND CORONARY ANGIOGRAPHY N/A 01/06/2020   Procedure: LEFT HEART CATH AND CORONARY ANGIOGRAPHY;  Surgeon: Avanell Leigh, MD;  Location: [facility];  Service: Cardiovascular;  Laterality: N/A;   LEFT HEART CATH AND CORONARY ANGIOGRAPHY N/A 01/29/2021   Procedure: LEFT HEART CATH AND CORONARY ANGIOGRAPHY;  Surgeon: Lucendia Rusk, MD;  Location: [facility];  Service: Cardiovascular;  Laterality: N/A;   TOTAL KNEE ARTHROPLASTY Left 12/18/2014  Procedure: TOTAL LEFT KNEE ARTHROPLASTY;  Surgeon: Genevie Kerns, MD;  Location: [facility];  Service: Orthopedics;  Laterality: Left;   TOTAL KNEE ARTHROPLASTY Right 05/15/2015   Procedure: RIGHT TOTAL KNEE ARTHROPLASTY;  Surgeon: Genevie Kerns, MD;  Location: [facility];  Service: Orthopedics;  Laterality: Right;   UTERINE FIBROID SURGERY         Inpatient Medications: Scheduled Meds:  Continuous Infusions:  heparin  850 Units/hr (01/15/24 2000)   PRN Meds:   Allergies:    Allergies  Allergen Reactions   Amoxicillin-Pot Clavulanate     Other reaction(s): vomiting    Aspirin      Other reaction(s): reflux and nervousness   Colchicine     Stomach cramps Other reaction(s): severe GI upset   Diclofenac     Oral causes stomach cramps   Dulaglutide Diarrhea and Nausea And Vomiting    Other reaction(s): diarrhea   Empagliflozin      Other reaction(s): yeast infection   Ezetimibe     Muscle pain Other reaction(s): muscle aches   Imdur  [Isosorbide  Nitrate] Other (See Comments)    hypotensive   Isosorbide  Other (See Comments)    Causes Hypotension   Metformin      Other reaction(s): stomach upset   Repaglinide      Other reaction(s): swelling and GI upset   Sitagliptin  Phos-Metformin  Hcl Nausea And Vomiting    Lightheaded  Other reaction(s): GI upset   Sulfamethoxazole-Trimethoprim Itching    Other reaction(s): itching   Imipramine Rash   Tape Rash    Paper tape ok to use     Social History:   Social History   Socioeconomic History   Marital status: Married    Spouse name: Not on file   Number of children: Not on file   Years of education: Not on file   Highest education level: Not on file  Occupational History   Occupation: retired  Tobacco Use   Smoking status: Never   Smokeless tobacco: Never  Vaping Use   Vaping status: Never Used  Substance and Sexual Activity   Alcohol use: Yes    Comment: rare   Drug use: No   Sexual activity: Not Currently    Partners: Male  Other Topics Concern   Not on file  Social History Narrative   Not on file   Social Drivers of Health   Financial Resource Strain: Not on file  Food Insecurity: Not on file  Transportation Needs: Not on file  Physical Activity: Not on file  Stress: Not on file  Social Connections: Not on file  Intimate Partner Violence: Not on file    Family History:    Family History  Problem Relation Age of Onset   Heart disease Mother    Hypertension Mother    Hyperlipidemia Mother    Cancer Mother    Heart attack Father    Hypertension Father    Hyperlipidemia  Father    Heart disease Father    Sudden death Father    Sleep apnea Father    Heart attack Sister    Heart attack Paternal Uncle    Breast cancer Maternal Grandmother 68   Diabetes Maternal Grandfather    Heart attack Maternal Grandfather    Congenital heart disease Son    BRCA 1/2 Neg Hx      ROS:  Please see the history of present illness.  ROS  All other ROS reviewed and negative.     Physical Exam/Data:   Vitals:   01/15/24 1330 01/15/24  1549 01/15/24 1854 01/15/24 1915  BP:  (!) 143/92 (!) 167/81 (!) 166/86  Pulse:  81 70 72  Resp:  18 14 16   Temp:  98.4 F (36.9 C) 98.1 F (36.7 C)   TempSrc:   Oral   SpO2:  100% 100% 100%  Weight: 78 kg     Height: 5' 3 (1.6 m)      No intake or output data in the 24 hours ending 01/15/24 2025 Filed Weights   01/15/24 1330  Weight: 78 kg   Body mass index is 30.47 kg/m.  General:  Well nourished, well developed, in no acute distress HEENT: normal Lymph: no adenopathy Neck: no JVD Endocrine:  No thryomegaly Vascular: No carotid bruits; FA pulses 2+ bilaterally without bruits  Cardiac:  normal S1, S2; RRR; no murmur  Lungs:  clear to auscultation bilaterally, no wheezing, rhonchi or rales  Abd: soft, nontender, no hepatomegaly  Ext: no edema Musculoskeletal:  No deformities, BUE and BLE strength normal and equal Skin: warm and dry  Neuro:  CNs 2-12 intact, no focal abnormalities noted Psych:  Normal affect   EKG:  The EKG was personally reviewed and demonstrates: NSR. Telemetry:  Telemetry was personally reviewed and demonstrates:  NSR.   Laboratory Data:  Chemistry Recent Labs  Lab 01/10/24 1523 01/15/24 1332  NA 141 138  K 4.3 4.3  CL 106 108  CO2 18* 22  GLUCOSE 86 138*  BUN 27 29*  CREATININE 0.87 0.85  CALCIUM  9.7 9.6  GFRNONAA  --  >60  ANIONGAP  --  8    Recent Labs  Lab 01/10/24 1523  PROT 6.5  ALBUMIN 4.5  AST 24  ALT 30  ALKPHOS 108  BILITOT 0.5   Hematology Recent Labs  Lab  01/15/24 1332  WBC 5.7  RBC 4.39  HGB 14.4  HCT 42.6  MCV 97.0  MCH 32.8  MCHC 33.8  RDW 11.8  PLT 187   Cardiac EnzymesNo results for input(s): TROPONINI in the last 168 hours. No results for input(s): TROPIPOC in the last 168 hours.  BNP Recent Labs  Lab 01/15/24 1332  BNP 21.4    DDimer No results for input(s): DDIMER in the last 168 hours.  Radiology/Studies:  DG Chest 1 View Result Date: 01/15/2024 CLINICAL DATA:  Shortness of breath. EXAM: CHEST  1 VIEW COMPARISON:  03/16/2005. FINDINGS: Low lung volumes. The cardiomediastinal silhouette is otherwise within normal limits. No focal consolidation, pleural effusion, or pneumothorax. Diffuse osseous demineralization. No acute osseous abnormality identified. IMPRESSION: No acute cardiopulmonary findings. Electronically Signed   By: Mannie Seek M.D.   On: 01/15/2024 14:20    Assessment and Plan:   NSTEMI: Presented with acute DOE since this morning and not feeling well.  Associated with nausea.  Denied having angina. EKG showed NSR, RBBB and PACs. Hs troponins elevated,4>>277.  Start ACS protocol.  Keep n.p.o. after midnight for [facility] tomorrow.  She had a prior history of RCA PCI in 2021 with residual moderate proximal to mid LAD disease.  Cardiac risk factors include HTN, diabetes mellitus type 2.  Obtain echocardiogram.  Informed consent for LHC Risks and benefits of cardiac catheterization have been discussed with the patient.  These include bleeding, infection, kidney damage, stroke, heart attack, death.  The patient understands these risks and is willing to proceed.  HTN, poorly controlled: Resume home carvedilol  to keep BP less than 130/80 mmHg.     For questions or updates, please contact [facility] Please  consult [email or web address] for contact info under Cardiology/STEMI.   Signed, Lakenya Riendeau Priya Trae Bovenzi, MD 01/15/2024 8:25 PM

## 2024-01-15 NOTE — ED Provider Triage Note (Signed)
 Emergency Medicine Provider Triage Evaluation Note  Pamela Love , a 73 y.o. female  was evaluated in triage.  Pt complains of SHOB, Palpitations. Hx cardiac stent  Review of Systems  Positive: Intermittent SHOB, fatigue, palpitations,  Negative: CP, N/V, HA, weakness, numbness  Physical Exam  BP (!) 183/85   Pulse 78   Temp 98.4 F (36.9 C)   Resp 16   SpO2 100%  Gen:   Awake, no distress, mildly anxious   Resp:  Normal effort  MSK:   Moves extremities without difficulty  Other:  No BLE edema  Medical Decision Making  Medically screening exam initiated at 1:29 PM.  Appropriate orders placed.  Pamela Love was informed that the remainder of the evaluation will be completed by another provider, this initial triage assessment does not replace that evaluation, and the importance of remaining in the ED until their evaluation is complete.  Labs and imaging ordered   Pamela Love 01/15/24 1332

## 2024-01-15 NOTE — Progress Notes (Signed)
 PHARMACY - ANTICOAGULATION CONSULT NOTE  Pharmacy Consult for Heparin  Indication: chest pain/ACS  Allergies  Allergen Reactions   Amoxicillin-Pot Clavulanate     Other reaction(s): vomiting   Aspirin      Other reaction(s): reflux and nervousness   Colchicine     Stomach cramps Other reaction(s): severe GI upset   Diclofenac     Oral causes stomach cramps   Dulaglutide Diarrhea and Nausea And Vomiting    Other reaction(s): diarrhea   Empagliflozin      Other reaction(s): yeast infection   Ezetimibe     Muscle pain Other reaction(s): muscle aches   Imdur  [Isosorbide  Nitrate] Other (See Comments)    hypotensive   Isosorbide  Other (See Comments)    Causes Hypotension   Metformin      Other reaction(s): stomach upset   Repaglinide      Other reaction(s): swelling and GI upset   Sitagliptin  Phos-Metformin  Hcl Nausea And Vomiting    Lightheaded  Other reaction(s): GI upset   Sulfamethoxazole-Trimethoprim Itching    Other reaction(s): itching   Imipramine Rash   Tape Rash    Paper tape ok to use     Patient Measurements: Height: 5' 3 (160 cm) Weight: 78 kg (172 lb) IBW/kg (Calculated) : 52.4 HEPARIN  DW (KG): 69.3  Vital Signs: Temp: 98.1 F (36.7 C) (06/16 1854) Temp Source: Oral (06/16 1854) BP: 167/81 (06/16 1854) Pulse Rate: 70 (06/16 1854)  Labs: Recent Labs    01/15/24 1332 01/15/24 1718  HGB 14.4  --   HCT 42.6  --   PLT 187  --   CREATININE 0.85  --   TROPONINIHS 4 277*    Estimated Creatinine Clearance: 56.5 mL/min (by C-G formula based on SCr of 0.85 mg/dL).   Medical History: Past Medical History:  Diagnosis Date   Abnormal nuclear stress test 01/02/2020   Arthritis    Bilateral leg edema 01/02/2020   Bursitis    hips   C. difficile colitis 12/2017   CAD (coronary artery disease) 01/06/2020   CKD (chronic kidney disease), stage III (HCC)    Complication of anesthesia    spinal did not work with second c section   Diabetes mellitus (HCC)  11/19/2015   Diabetes mellitus, type 2 (HCC)    diet controlled - has Rx for Glymipride but has not started taking yet   Dyslipidemia 10/21/2014   GERD (gastroesophageal reflux disease)    History of nonmelanoma skin cancer    History of skin cancer    Hypercholesterolemia 12/11/2019   Hyperlipidemia    Hypertension    Hypertensive disorder 10/21/2014   Lower extremity edema    Mixed hyperlipidemia 01/02/2020   OAB (overactive bladder)    Obesity (BMI 30-39.9) 05/12/2020   OSA (obstructive sleep apnea) 05/12/2020   Osteoarthritis of right knee 05/15/2015   Osteoarthrosis, unspecified whether generalized or localized, involving lower leg 10/21/2014   Osteoporosis    Over weight    Primary osteoarthritis of left knee 12/18/2014   S/P knee replacement 12/18/2014   S/P total knee replacement using cement 05/15/2015   Sleep apnea    Tachycardia 12/11/2019     Assessment: Pamela Love presenting with SOB, CP, and fatigue. Hx of CAD s/p PCI to RCA (2021) with residual moderate LAD disease. No PTA anticoagulation. Pharmacy consulted to dose heparin . CBC stable - Hgb 14.4, PLTc 187   Goal of Therapy:  Heparin  level 0.3-0.7 units/ml Monitor platelets by anticoagulation protocol: Yes   Plan:  Give 4000 units bolus x  1 Start heparin  infusion at 850 units/hr Check anti-Xa level in 8 hours and daily while on heparin  Continue to monitor H&H and platelets  Pamela Love 01/15/2024,7:24 PM

## 2024-01-15 NOTE — ED Triage Notes (Signed)
 Patient presents with shob and fatigue on standing x 1 hr. Patient reports no unilateral deficit or heart palpitations, denies chest pain, fever, N/V, syncope.

## 2024-01-15 NOTE — Progress Notes (Signed)
   01/15/24 2231  BiPAP/CPAP/SIPAP  $ Face Mask Medium Yes  BiPAP/CPAP/SIPAP Pt Type Adult  BiPAP/CPAP/SIPAP Resmed  Mask Type Full face mask  Mask Size Medium  Respiratory Rate 17 breaths/min  EPAP 10 cmH2O  Patient Home Machine No  Patient Home Mask No  Patient Home Tubing No  Auto Titrate No  BiPAP/CPAP /SiPAP Vitals  Pulse Rate 75  Resp 17  SpO2 100 %  Bilateral Breath Sounds Clear  MEWS Score/Color  MEWS Score 0  MEWS Score Color Marrie Sizer

## 2024-01-15 NOTE — ED Provider Notes (Signed)
 [facility] AT [facility] Provider Note   CSN: [id number] Arrival date & time: 01/15/24  1307     Patient presents with: Shortness of Breath and Fatigue   Pamela Love is a 73 y.o. female.    Shortness of Breath  This patient is a 73 year old female, she has a known history of coronary artery disease with stents placed several years ago by Dr. Dean Every, she currently takes Plavix , she is from [place] and was here visiting a friend today when she developed acute onset of shortness of breath and nausea with lightheadedness and near syncope, she made it to the hospital but felt so lightheaded with any movement that she decided to stay in a seated position.  The patient had some nausea but no diaphoresis no swelling of the legs, minimal chest pain if any.  No fevers or chills, no recent illnesses    Prior to Admission medications   Medication Sig Start Date End Date Taking? Authorizing Provider  Accu-Chek Softclix Lancets lancets USE TO CHECK BLOOD SUGAR TWICE DAILY 01/01/21   Gwyndolyn Lerner, MD  acetaminophen  (TYLENOL ) 650 MG CR tablet Take 1,300 mg by mouth at bedtime.    [provider]  aspirin  EC 81 MG tablet Take 1 tablet (81 mg total) by mouth in the morning. 01/29/21   Lucendia Rusk, MD  Blood Glucose Monitoring Suppl (ACCU-CHEK GUIDE ME) w/Device KIT 1 each by Does not apply route 2 (two) times daily. E11.9 12/23/20   Gwyndolyn Lerner, MD  carvedilol  (COREG ) 3.125 MG tablet TAKE 1 TABLET BY MOUTH TWICE A DAY WITH A MEAL 04/10/23   Sheryle Donning, MD  Cholecalciferol (VITAMIN D ) 50 MCG (2000 UT) CAPS Take 1 capsule (2,000 Units total) by mouth every morning. 03/02/23   Opalski, Deborah, DO  clopidogrel  (PLAVIX ) 75 MG tablet TAKE 1 TABLET BY MOUTH EVERY DAY WITH BREAKFAST 09/11/23   Sheryle Donning, MD  Coenzyme Q10 (COQ10) 200 MG CAPS Take 200 mg by mouth in the morning.    [provider]  DULoxetine  (CYMBALTA ) 20 MG  capsule Take 40 mg by mouth at bedtime.     [provider]  glucose blood (ACCU-CHEK GUIDE) test strip Use to check BS 2x a day 01/20/22   Emilie Harden, MD  levocetirizine (XYZAL ) 5 MG tablet Take 1 tablet (5 mg total) by mouth every evening. Patient not taking: Reported on 01/10/2024 06/05/23   Marceil Sensor, DO  Multiple Vitamin (MULTIVITAMIN WITH MINERALS) TABS tablet Take 1 tablet by mouth every morning.     [provider]  nitroGLYCERIN  (NITROSTAT ) 0.4 MG SL tablet PLACE 1 TABLET (0.4 MG TOTAL) UNDER THE TONGUE EVERY 5 (FIVE) MINUTES X 3 DOSES AS NEEDED FOR CHEST PAIN. 10/23/23 01/21/24  Clearnce Curia, NP  Omega-3 Fatty Acids (RA FISH OIL) 1400 MG CPDR Take 1,400 mg by mouth 2 (two) times daily.    [provider]  pantoprazole  (PROTONIX ) 40 MG tablet Take 40 mg by mouth daily.    [provider]  ranolazine  (RANEXA ) 1000 MG SR tablet TAKE 1 TABLET BY MOUTH TWICE A DAY 08/29/23   Sheryle Donning, MD  rosuvastatin  (CRESTOR ) 40 MG tablet TAKE 1 TABLET (40 MG TOTAL) BY MOUTH IN THE MORNING 09/11/23   Sheryle Donning, MD  saccharomyces boulardii (FLORASTOR) 250 MG capsule Take 250 mg by mouth in the morning.    [provider]  tirzepatide  (MOUNJARO ) 12.5 MG/0.5ML Pen Inject 12.5 mg into the skin once a  week. 12/26/23   Napoleon Backer D, NP  trospium (SANCTURA) 20 MG tablet Take 20 mg by mouth 2 (two) times daily. 09/25/14   [provider]  vitamin C (ASCORBIC ACID) 250 MG tablet Take 250 mg by mouth in the morning.    [provider]    Allergies: Amoxicillin-pot clavulanate, Aspirin , Colchicine, Diclofenac, Dulaglutide, Empagliflozin , Ezetimibe, Imdur  [isosorbide  nitrate], Isosorbide , Metformin , Repaglinide , Sitagliptin  phos-metformin  hcl, Sulfamethoxazole-trimethoprim, Imipramine, and Tape    Review of Systems  Respiratory:  Positive for shortness of breath.   All other systems reviewed and are  negative.   Updated Vital Signs BP (!) 167/81 (BP Location: Left Wrist)   Pulse 70   Temp 98.1 F (36.7 C) (Oral)   Resp 14   Ht 1.6 m (5' 3)   Wt 78 kg   SpO2 100%   BMI 30.47 kg/m   Physical Exam Vitals and nursing note reviewed.  Constitutional:      General: She is not in acute distress.    Appearance: She is well-developed.  HENT:     Head: Normocephalic and atraumatic.     Mouth/Throat:     Pharynx: No oropharyngeal exudate.   Eyes:     General: No scleral icterus.       Right eye: No discharge.        Left eye: No discharge.     Conjunctiva/sclera: Conjunctivae normal.     Pupils: Pupils are equal, round, and reactive to light.   Neck:     Thyroid: No thyromegaly.     Vascular: No JVD.   Cardiovascular:     Rate and Rhythm: Normal rate and regular rhythm.     Heart sounds: Normal heart sounds. No murmur heard.    No friction rub. No gallop.  Pulmonary:     Effort: Pulmonary effort is normal. No respiratory distress.     Breath sounds: Normal breath sounds. No wheezing or rales.  Abdominal:     General: Bowel sounds are normal. There is no distension.     Palpations: Abdomen is soft. There is no mass.     Tenderness: There is no abdominal tenderness.   Musculoskeletal:        General: No tenderness. Normal range of motion.     Cervical back: Normal range of motion and neck supple.  Lymphadenopathy:     Cervical: No cervical adenopathy.   Skin:    General: Skin is warm and dry.     Findings: No erythema or rash.   Neurological:     Mental Status: She is alert.     Coordination: Coordination normal.   Psychiatric:        Behavior: Behavior normal.     (all labs ordered are listed, but only abnormal results are displayed) Labs Reviewed  BASIC METABOLIC PANEL WITH GFR - Abnormal; Notable for the following components:      Result Value   Glucose, Bld 138 (*)    BUN 29 (*)    All other components within normal limits  TROPONIN I (HIGH  SENSITIVITY) - Abnormal; Notable for the following components:   Troponin I (High Sensitivity) 277 (*)    All other components within normal limits  CBC  BRAIN NATRIURETIC PEPTIDE  D-DIMER, QUANTITATIVE  TROPONIN I (HIGH SENSITIVITY)    EKG: EKG Interpretation Date/Time:  Monday January 15 2024 13:28:21 EDT Ventricular Rate:  79 PR Interval:  184 QRS Duration:  90 QT Interval:  400 QTC Calculation: 458 R Axis:   -  47  Text Interpretation: Sinus rhythm with frequent Premature ventricular complexes in a pattern of bigeminy Left axis deviation Moderate voltage criteria for LVH, may be normal variant ( R in aVL , Cornell product ) Septal infarct , age undetermined Abnormal ECG When compared with ECG of 05-Dec-2023 13:02, PREVIOUS ECG IS PRESENT Since last tracing QRS QT has lengthened Confirmed by Early Glisson ([id number]) on 01/15/2024 7:07:19 PM  Radiology: Lenell Query Chest 1 View Result Date: 01/15/2024 CLINICAL DATA:  Shortness of breath. EXAM: CHEST  1 VIEW COMPARISON:  03/16/2005. FINDINGS: Low lung volumes. The cardiomediastinal silhouette is otherwise within normal limits. No focal consolidation, pleural effusion, or pneumothorax. Diffuse osseous demineralization. No acute osseous abnormality identified. IMPRESSION: No acute cardiopulmonary findings. Electronically Signed   By: Mannie Seek M.D.   On: 01/15/2024 14:20     .Critical Care  Performed by: Early Glisson, MD Authorized by: Early Glisson, MD   Critical care provider statement:    Critical care time (minutes):  45   Critical care time was exclusive of:  Separately billable procedures and treating other patients and teaching time   Critical care was necessary to treat or prevent imminent or life-threatening deterioration of the following conditions:  Cardiac failure   Critical care was time spent personally by me on the following activities:  Development of treatment plan with patient or surrogate, discussions with consultants,  evaluation of patient's response to treatment, examination of patient, obtaining history from patient or surrogate, review of old charts, re-evaluation of patient's condition, pulse oximetry, ordering and review of radiographic studies, ordering and review of laboratory studies and ordering and performing treatments and interventions   I assumed direction of critical care for this patient from another provider in my specialty: no     Care discussed with: admitting provider   Comments:          Medications Ordered in the ED  aspirin  chewable tablet 324 mg (has no administration in time range)                                    Medical Decision Making Risk OTC drugs.    This patient presents to the ED for concern of shortness of breath, this involves an extensive number of treatment options, and is a complaint that carries with it a high risk of complications and morbidity.  The differential diagnosis includes pulmonary embolism, acute coronary syndrome, pneumothorax, pericarditis, pneumonia   Co morbidities / Chronic conditions that complicate the patient evaluation  Known diabetes hypertension and coronary disease   Additional history obtained:  Additional history obtained from EMR External records from outside source obtained and reviewed including prior coronary catheterization results   Lab Tests:  I Ordered, and personally interpreted labs.  The pertinent results include: Checked a troponin, initially negative, second troponin rose to 277   Imaging Studies ordered:  I ordered imaging studies including chest x-ray I independently visualized and interpreted imaging which showed no acute findings I agree with the radiologist interpretation   Cardiac Monitoring: / EKG:  The patient was maintained on a cardiac monitor.  I personally viewed and interpreted the cardiac monitored which showed an underlying rhythm of: The patient has been in normal sinus rhythm, there is a  slight widening of the QRS   Problem List / ED Course / Critical interventions / Medication management  The patient has no symptoms at this time, while she  is resting.  Her troponin did rise to 277 suggestive of acute ischemia I ordered medication including heparin  Reevaluation of the patient after these medicines showed that the patient improved I have reviewed the patients home medicines and have made adjustments as needed   Consultations Obtained: I requested consultation with the audiologist Dr. Mallipeddi,  and discussed lab and imaging findings as well as pertinent plan - they recommend: They will come to see the patient for admission   Social Determinants of Health:  The patient has known coronary disease   Test / Admission - Considered:  Will admit      Final diagnoses:  NSTEMI (non-ST elevated myocardial infarction) [facility]    ED Discharge Orders     None          Early Glisson, MD 01/15/24 1920

## 2024-01-16 ENCOUNTER — Other Ambulatory Visit (HOSPITAL_COMMUNITY)

## 2024-01-16 ENCOUNTER — Encounter (HOSPITAL_COMMUNITY)
Admission: EM | Disposition: A | Discharge status: Home / Self Care | Payer: Self-pay | Source: Home / Self Care | Attending: Internal Medicine

## 2024-01-16 DIAGNOSIS — G4733 Obstructive sleep apnea (adult) (pediatric): Secondary | ICD-10-CM | POA: Diagnosis not present

## 2024-01-16 DIAGNOSIS — I160 Hypertensive urgency: Secondary | ICD-10-CM | POA: Diagnosis not present

## 2024-01-16 DIAGNOSIS — E669 Obesity, unspecified: Secondary | ICD-10-CM

## 2024-01-16 DIAGNOSIS — E66811 Obesity, class 1: Secondary | ICD-10-CM | POA: Diagnosis not present

## 2024-01-16 DIAGNOSIS — Z794 Long term (current) use of insulin: Secondary | ICD-10-CM

## 2024-01-16 DIAGNOSIS — I214 Non-ST elevation (NSTEMI) myocardial infarction: Secondary | ICD-10-CM | POA: Diagnosis not present

## 2024-01-16 DIAGNOSIS — I4510 Unspecified right bundle-branch block: Secondary | ICD-10-CM | POA: Diagnosis not present

## 2024-01-16 DIAGNOSIS — K219 Gastro-esophageal reflux disease without esophagitis: Secondary | ICD-10-CM

## 2024-01-16 DIAGNOSIS — I25118 Atherosclerotic heart disease of native coronary artery with other forms of angina pectoris: Secondary | ICD-10-CM

## 2024-01-16 DIAGNOSIS — E7800 Pure hypercholesterolemia, unspecified: Secondary | ICD-10-CM | POA: Diagnosis not present

## 2024-01-16 DIAGNOSIS — R0602 Shortness of breath: Secondary | ICD-10-CM | POA: Diagnosis not present

## 2024-01-16 DIAGNOSIS — N183 Chronic kidney disease, stage 3 unspecified: Secondary | ICD-10-CM | POA: Diagnosis not present

## 2024-01-16 DIAGNOSIS — I21A1 Myocardial infarction type 2: Secondary | ICD-10-CM | POA: Diagnosis not present

## 2024-01-16 DIAGNOSIS — E1122 Type 2 diabetes mellitus with diabetic chronic kidney disease: Secondary | ICD-10-CM | POA: Diagnosis not present

## 2024-01-16 DIAGNOSIS — I2510 Atherosclerotic heart disease of native coronary artery without angina pectoris: Secondary | ICD-10-CM | POA: Diagnosis not present

## 2024-01-16 DIAGNOSIS — E785 Hyperlipidemia, unspecified: Secondary | ICD-10-CM | POA: Diagnosis not present

## 2024-01-16 DIAGNOSIS — I129 Hypertensive chronic kidney disease with stage 1 through stage 4 chronic kidney disease, or unspecified chronic kidney disease: Secondary | ICD-10-CM | POA: Diagnosis not present

## 2024-01-16 DIAGNOSIS — E1159 Type 2 diabetes mellitus with other circulatory complications: Secondary | ICD-10-CM

## 2024-01-16 HISTORY — PX: LEFT HEART CATH AND CORONARY ANGIOGRAPHY: CATH118249

## 2024-01-16 HISTORY — PX: CORONARY PRESSURE/FFR WITH 3D MAPPING: CATH118309

## 2024-01-16 LAB — GLUCOSE, CAPILLARY: Glucose-Capillary: 109 mg/dL — ABNORMAL HIGH (ref 70–99)

## 2024-01-16 LAB — MAGNESIUM: Magnesium: 1.8 mg/dL (ref 1.7–2.4)

## 2024-01-16 LAB — COMPREHENSIVE METABOLIC PANEL WITH GFR
ALT: 27 U/L (ref 0–44)
AST: 22 U/L (ref 15–41)
Albumin: 3.4 g/dL — ABNORMAL LOW (ref 3.5–5.0)
Alkaline Phosphatase: 71 U/L (ref 38–126)
Anion gap: 9 (ref 5–15)
BUN: 26 mg/dL — ABNORMAL HIGH (ref 8–23)
CO2: 22 mmol/L (ref 22–32)
Calcium: 9.3 mg/dL (ref 8.9–10.3)
Chloride: 109 mmol/L (ref 98–111)
Creatinine, Ser: 0.92 mg/dL (ref 0.44–1.00)
GFR, Estimated: >60 mL/min (ref >60)
Glucose, Bld: 110 mg/dL — ABNORMAL HIGH (ref 70–99)
Potassium: 3.9 mmol/L (ref 3.5–5.1)
Sodium: 140 mmol/L (ref 135–145)
Total Bilirubin: 0.6 mg/dL (ref 0.0–1.2)
Total Protein: 5.7 g/dL — ABNORMAL LOW (ref 6.5–8.1)

## 2024-01-16 LAB — HEPARIN LEVEL (UNFRACTIONATED): Heparin Unfractionated: 0.23 [IU]/mL — ABNORMAL LOW (ref 0.30–0.70)

## 2024-01-16 LAB — CBC
HCT: 38.9 % (ref 36.0–46.0)
Hemoglobin: 13 g/dL (ref 12.0–15.0)
MCH: 32.3 pg (ref 26.0–34.0)
MCHC: 33.4 g/dL (ref 30.0–36.0)
MCV: 96.5 fL (ref 80.0–100.0)
Platelets: 178 10*3/uL (ref 150–400)
RBC: 4.03 MIL/uL (ref 3.87–5.11)
RDW: 11.9 % (ref 11.5–15.5)
WBC: 7 10*3/uL (ref 4.0–10.5)
nRBC: 0 % (ref 0.0–0.2)

## 2024-01-16 LAB — LIPID PANEL
Cholesterol: 130 mg/dL (ref 0–200)
HDL: 52 mg/dL (ref >40)
LDL Cholesterol: 64 mg/dL (ref 0–99)
Total CHOL/HDL Ratio: 2.5 ratio
Triglycerides: 68 mg/dL (ref <150)
VLDL: 14 mg/dL (ref 0–40)

## 2024-01-16 LAB — TROPONIN I (HIGH SENSITIVITY): Troponin I (High Sensitivity): 189 ng/L (ref <18)

## 2024-01-16 LAB — CBG MONITORING, ED: Glucose-Capillary: 105 mg/dL — ABNORMAL HIGH (ref 70–99)

## 2024-01-16 MED ORDER — HEPARIN SODIUM (PORCINE) 1000 UNIT/ML IJ SOLN
INTRAMUSCULAR | Status: AC
  Filled 2024-01-16: qty 10

## 2024-01-16 MED ORDER — MIDAZOLAM HCL 2 MG/2ML IJ SOLN
INTRAMUSCULAR | Status: AC
  Filled 2024-01-16: qty 2

## 2024-01-16 MED ORDER — SODIUM CHLORIDE 0.9 % WEIGHT BASED INFUSION: 1.0000 mL/kg/h | INTRAVENOUS | Status: DC

## 2024-01-16 MED ORDER — SODIUM CHLORIDE 0.9 % IV SOLN: 250.0000 mL | INTRAVENOUS | Status: DC | PRN

## 2024-01-16 MED ORDER — ROSUVASTATIN CALCIUM 20 MG PO TABS: 40.0000 mg | ORAL_TABLET | Freq: Every morning | ORAL | Status: DC

## 2024-01-16 MED ORDER — HYDRALAZINE HCL 20 MG/ML IJ SOLN: 10.0000 mg | INTRAMUSCULAR | Status: DC | PRN

## 2024-01-16 MED ORDER — DULOXETINE HCL 20 MG PO CPEP: 40.0000 mg | ORAL_CAPSULE | Freq: Every day | ORAL | Status: DC

## 2024-01-16 MED ORDER — CARVEDILOL 3.125 MG PO TABS: 3.1250 mg | ORAL_TABLET | Freq: Two times a day (BID) | ORAL | Status: DC

## 2024-01-16 MED ORDER — MIDAZOLAM HCL 2 MG/2ML IJ SOLN
INTRAMUSCULAR | Status: DC | PRN
  Administered 2024-01-16: 1 mg via INTRAVENOUS

## 2024-01-16 MED ORDER — VERAPAMIL HCL 2.5 MG/ML IV SOLN
INTRAVENOUS | Status: AC
  Filled 2024-01-16: qty 2

## 2024-01-16 MED ORDER — LIDOCAINE HCL (PF) 1 % IJ SOLN
INTRAMUSCULAR | Status: AC
  Filled 2024-01-16: qty 30

## 2024-01-16 MED ORDER — ASPIRIN 81 MG PO CHEW: 81.0000 mg | CHEWABLE_TABLET | ORAL | Status: DC

## 2024-01-16 MED ORDER — INSULIN ASPART 100 UNIT/ML IJ SOLN: 0.0000 [IU] | Freq: Four times a day (QID) | INTRAMUSCULAR | Status: DC

## 2024-01-16 MED ORDER — FESOTERODINE FUMARATE ER 4 MG PO TB24: 4.0000 mg | ORAL_TABLET | Freq: Every day | ORAL | Status: DC

## 2024-01-16 MED ORDER — FENTANYL CITRATE (PF) 100 MCG/2ML IJ SOLN
INTRAMUSCULAR | Status: AC
  Filled 2024-01-16: qty 2

## 2024-01-16 MED ORDER — LIDOCAINE HCL (PF) 1 % IJ SOLN
INTRAMUSCULAR | Status: DC | PRN
  Administered 2024-01-16: 2 mL

## 2024-01-16 MED ORDER — IOHEXOL 350 MG/ML SOLN
INTRAVENOUS | Status: DC | PRN
  Administered 2024-01-16: 40 mL

## 2024-01-16 MED ORDER — ACETAMINOPHEN 650 MG RE SUPP: 650.0000 mg | Freq: Four times a day (QID) | RECTAL | Status: DC | PRN

## 2024-01-16 MED ORDER — SODIUM CHLORIDE 0.9% FLUSH: 3.0000 mL | INTRAVENOUS | Status: DC | PRN

## 2024-01-16 MED ORDER — SODIUM CHLORIDE 0.9 % WEIGHT BASED INFUSION
1.0000 mL/kg/h | INTRAVENOUS | Status: DC
  Administered 2024-01-16: 1 mL/kg/h via INTRAVENOUS

## 2024-01-16 MED ORDER — SODIUM CHLORIDE 0.9% FLUSH: 3.0000 mL | Freq: Two times a day (BID) | INTRAVENOUS | Status: DC

## 2024-01-16 MED ORDER — LABETALOL HCL 5 MG/ML IV SOLN: 10.0000 mg | INTRAVENOUS | Status: DC | PRN

## 2024-01-16 MED ORDER — SODIUM CHLORIDE 0.9 % WEIGHT BASED INFUSION
3.0000 mL/kg/h | INTRAVENOUS | Status: AC
  Administered 2024-01-16: 3 mL/kg/h via INTRAVENOUS

## 2024-01-16 MED ORDER — VERAPAMIL HCL 2.5 MG/ML IV SOLN
INTRAVENOUS | Status: DC | PRN
  Administered 2024-01-16: 10 mL via INTRA_ARTERIAL

## 2024-01-16 MED ORDER — HEPARIN SODIUM (PORCINE) 1000 UNIT/ML IJ SOLN
INTRAMUSCULAR | Status: DC | PRN
  Administered 2024-01-16: 4000 [IU] via INTRAVENOUS

## 2024-01-16 MED ORDER — FENTANYL CITRATE (PF) 100 MCG/2ML IJ SOLN
INTRAMUSCULAR | Status: DC | PRN
  Administered 2024-01-16: 25 ug via INTRAVENOUS

## 2024-01-16 MED ORDER — HEPARIN (PORCINE) IN NACL 1000-0.9 UT/500ML-% IV SOLN
INTRAVENOUS | Status: DC | PRN
  Administered 2024-01-16 (×2): 500 mL

## 2024-01-16 MED ORDER — CLOPIDOGREL BISULFATE 75 MG PO TABS: 75.0000 mg | ORAL_TABLET | Freq: Every day | ORAL | Status: DC

## 2024-01-16 MED ORDER — SODIUM CHLORIDE 0.9 % WEIGHT BASED INFUSION: 3.0000 mL/kg/h | INTRAVENOUS | Status: DC

## 2024-01-16 MED ORDER — ONDANSETRON HCL 4 MG/2ML IJ SOLN: 4.0000 mg | Freq: Four times a day (QID) | INTRAMUSCULAR | Status: DC | PRN

## 2024-01-16 MED ORDER — ASPIRIN 81 MG PO CHEW
81.0000 mg | CHEWABLE_TABLET | ORAL | Status: AC
  Administered 2024-01-16: 81 mg via ORAL
  Filled 2024-01-16: qty 1

## 2024-01-16 MED ORDER — NITROGLYCERIN 0.4 MG SL SUBL: 0.4000 mg | SUBLINGUAL_TABLET | SUBLINGUAL | Status: DC | PRN

## 2024-01-16 MED ORDER — ACETAMINOPHEN 325 MG PO TABS: 650.0000 mg | ORAL_TABLET | Freq: Four times a day (QID) | ORAL | Status: DC | PRN

## 2024-01-16 MED ORDER — PANTOPRAZOLE SODIUM 40 MG PO TBEC: 40.0000 mg | DELAYED_RELEASE_TABLET | Freq: Every day | ORAL | Status: DC

## 2024-01-16 NOTE — H&P (Signed)
 History and Physical    Patient: Pamela Love ZOX:[id number] DOB: September 19, 1950 DOA: 01/15/2024 DOS: the patient was seen and examined on 01/16/2024 PCP: Glena Landau, MD  Patient coming from: Home  Chief Complaint:  Chief Complaint  Patient presents with   Shortness of Breath   Fatigue   HPI: Tuleen Dickenman Montroy is a 73 y.o. female with medical history significant of hypertension, hyperlipidemia, CAD s/p RCA PCI in 2021, DM type II, OSA, and obesity who presented with complaints of shortness of breath.  She experienced shortness of breath yesterday while driving to [facility] to meet a friend for lunch. Palpitations occurred the night before while sitting on her bed with an iPad, described as feeling her heartbeat strongly for about three minutes.  She also experienced nausea, an inability to eat lunch due to feeling that she might vomit, a headache, and lightheadedness when getting into the car, though not experiencing spinning dizziness. No chest pain or significant leg swelling was noted. Her weight has been stable, with a fluctuation of about five pounds.  She has a history of coronary artery disease, having had two stents placed in one artery during the COVID pandemic after experiencing shortness of breath while climbing stairs. There is a significant family history of heart disease, including a younger sister who had heart bypass surgery at around age 59, a father who died of a heart attack at 39, and other relatives with heart issues.  Her diabetes is managed with Mounjaro  once a week on Thursdays, and her last hemoglobin A1c was 5.6. She occasionally uses alcohol and does not use drugs or smoke cigarettes.  In the emergency department patient was noted to be afebrile with blood pressures 97/55-183/85, and O2 saturation maintained on room air.  EKG revealed a normal sinus rhythm with right bundle branch block and PACs without significant ischemic changes.  Labs  since 6/16 significant for troponin 4-> 277.  Chest x-ray showed no acute abnormality.  Cardiology had been consulted with plans to take patient for cardiac catheterization.  Patient was given full dose aspirin  and started on a heparin  drip.   Review of Systems: As mentioned in the history of present illness. All other systems reviewed and are negative. Past Medical History:  Diagnosis Date   Abnormal nuclear stress test 01/02/2020   Arthritis    Bilateral leg edema 01/02/2020   Bursitis    hips   C. difficile colitis 12/2017   CAD (coronary artery disease) 01/06/2020   CKD (chronic kidney disease), stage III (HCC)    Complication of anesthesia    spinal did not work with second c section   Diabetes mellitus (HCC) 11/19/2015   Diabetes mellitus, type 2 (HCC)    diet controlled - has Rx for Glymipride but has not started taking yet   Dyslipidemia 10/21/2014   GERD (gastroesophageal reflux disease)    History of nonmelanoma skin cancer    History of skin cancer    Hypercholesterolemia 12/11/2019   Hyperlipidemia    Hypertension    Hypertensive disorder 10/21/2014   Lower extremity edema    Mixed hyperlipidemia 01/02/2020   OAB (overactive bladder)    Obesity (BMI 30-39.9) 05/12/2020   OSA (obstructive sleep apnea) 05/12/2020   Osteoarthritis of right knee 05/15/2015   Osteoarthrosis, unspecified whether generalized or localized, involving lower leg 10/21/2014   Osteoporosis    Over weight    Primary osteoarthritis of left knee 12/18/2014   S/P knee replacement 12/18/2014   S/P total knee  replacement using cement 05/15/2015   Sleep apnea    Tachycardia 12/11/2019   Past Surgical History:  Procedure Laterality Date   ABDOMINAL HYSTERECTOMY  2005   CATARACT EXTRACTION     CESAREAN SECTION     x2   colonscopy      CORONARY PRESSURE/FFR STUDY N/A 01/06/2020   Procedure: INTRAVASCULAR PRESSURE WIRE/FFR STUDY;  Surgeon: Avanell Leigh, MD;  Location: [facility] INVASIVE CV LAB;  Service:  Cardiovascular;  Laterality: N/A;   CORONARY STENT INTERVENTION N/A 01/06/2020   Procedure: CORONARY STENT INTERVENTION;  Surgeon: Avanell Leigh, MD;  Location: [facility] INVASIVE CV LAB;  Service: Cardiovascular;  Laterality: N/A;   EYE SURGERY     bilat cataract surgery 2015   LEFT HEART CATH AND CORONARY ANGIOGRAPHY N/A 01/06/2020   Procedure: LEFT HEART CATH AND CORONARY ANGIOGRAPHY;  Surgeon: Avanell Leigh, MD;  Location: [facility] INVASIVE CV LAB;  Service: Cardiovascular;  Laterality: N/A;   LEFT HEART CATH AND CORONARY ANGIOGRAPHY N/A 01/29/2021   Procedure: LEFT HEART CATH AND CORONARY ANGIOGRAPHY;  Surgeon: Lucendia Rusk, MD;  Location: [facility] INVASIVE CV LAB;  Service: Cardiovascular;  Laterality: N/A;   TOTAL KNEE ARTHROPLASTY Left 12/18/2014   Procedure: TOTAL LEFT KNEE ARTHROPLASTY;  Surgeon: Genevie Kerns, MD;  Location: [facility] ORS;  Service: Orthopedics;  Laterality: Left;   TOTAL KNEE ARTHROPLASTY Right 05/15/2015   Procedure: RIGHT TOTAL KNEE ARTHROPLASTY;  Surgeon: Genevie Kerns, MD;  Location: [facility] ORS;  Service: Orthopedics;  Laterality: Right;   UTERINE FIBROID SURGERY     Social History:  reports that she has never smoked. She has never used smokeless tobacco. She reports current alcohol use. She reports that she does not use drugs.  Allergies  Allergen Reactions   Amoxicillin-Pot Clavulanate     Other reaction(s): vomiting   Aspirin      Other reaction(s): reflux and nervousness   Colchicine     Stomach cramps Other reaction(s): severe GI upset   Diclofenac     Oral causes stomach cramps   Dulaglutide Diarrhea and Nausea And Vomiting    Other reaction(s): diarrhea   Empagliflozin      Other reaction(s): yeast infection   Ezetimibe     Muscle pain Other reaction(s): muscle aches   Imdur  [Isosorbide  Nitrate] Other (See Comments)    hypotensive   Isosorbide  Other (See Comments)    Causes Hypotension   Metformin      Other reaction(s): stomach upset   Repaglinide       Other reaction(s): swelling and GI upset   Sitagliptin  Phos-Metformin  Hcl Nausea And Vomiting    Lightheaded  Other reaction(s): GI upset   Sulfamethoxazole-Trimethoprim Itching    Other reaction(s): itching   Imipramine Rash   Tape Rash    Paper tape ok to use     Family History  Problem Relation Age of Onset   Heart disease Mother    Hypertension Mother    Hyperlipidemia Mother    Cancer Mother    Heart attack Father    Hypertension Father    Hyperlipidemia Father    Heart disease Father    Sudden death Father    Sleep apnea Father    Heart attack Sister    Heart attack Paternal Uncle    Breast cancer Maternal Grandmother 51   Diabetes Maternal Grandfather    Heart attack Maternal Grandfather    Congenital heart disease Son    BRCA 1/2 Neg Hx     Prior to Admission medications   Medication  Sig Start Date End Date Taking? Authorizing Provider  Accu-Chek Softclix Lancets lancets USE TO CHECK BLOOD SUGAR TWICE DAILY 01/01/21   Gwyndolyn Lerner, MD  acetaminophen  (TYLENOL ) 650 MG CR tablet Take 1,300 mg by mouth at bedtime.    [provider]  aspirin  EC 81 MG tablet Take 1 tablet (81 mg total) by mouth in the morning. 01/29/21   Lucendia Rusk, MD  Blood Glucose Monitoring Suppl (ACCU-CHEK GUIDE ME) w/Device KIT 1 each by Does not apply route 2 (two) times daily. E11.9 12/23/20   Gwyndolyn Lerner, MD  carvedilol  (COREG ) 3.125 MG tablet TAKE 1 TABLET BY MOUTH TWICE A DAY WITH A MEAL 04/10/23   Sheryle Donning, MD  Cholecalciferol (VITAMIN D ) 50 MCG (2000 UT) CAPS Take 1 capsule (2,000 Units total) by mouth every morning. 03/02/23   Opalski, Bernardo Bridgeman, DO  clopidogrel  (PLAVIX ) 75 MG tablet TAKE 1 TABLET BY MOUTH EVERY DAY WITH BREAKFAST 09/11/23   Sheryle Donning, MD  Coenzyme Q10 (COQ10) 200 MG CAPS Take 200 mg by mouth in the morning.    [provider]  DULoxetine  (CYMBALTA ) 20 MG capsule Take 40 mg by mouth at bedtime.     [provider]   glucose blood (ACCU-CHEK GUIDE) test strip Use to check BS 2x a day 01/20/22   Emilie Harden, MD  levocetirizine (XYZAL ) 5 MG tablet Take 1 tablet (5 mg total) by mouth every evening. Patient not taking: Reported on 01/10/2024 06/05/23   Marceil Sensor, DO  Multiple Vitamin (MULTIVITAMIN WITH MINERALS) TABS tablet Take 1 tablet by mouth every morning.     [provider]  nitroGLYCERIN  (NITROSTAT ) 0.4 MG SL tablet PLACE 1 TABLET (0.4 MG TOTAL) UNDER THE TONGUE EVERY 5 (FIVE) MINUTES X 3 DOSES AS NEEDED FOR CHEST PAIN. 10/23/23 01/21/24  Clearnce Curia, NP  Omega-3 Fatty Acids (RA FISH OIL) 1400 MG CPDR Take 1,400 mg by mouth 2 (two) times daily.    [provider]  pantoprazole  (PROTONIX ) 40 MG tablet Take 40 mg by mouth daily.    [provider]  ranolazine  (RANEXA ) 1000 MG SR tablet TAKE 1 TABLET BY MOUTH TWICE A DAY 08/29/23   Sheryle Donning, MD  rosuvastatin  (CRESTOR ) 40 MG tablet TAKE 1 TABLET (40 MG TOTAL) BY MOUTH IN THE MORNING 09/11/23   Sheryle Donning, MD  saccharomyces boulardii (FLORASTOR) 250 MG capsule Take 250 mg by mouth in the morning.    [provider]  tirzepatide  (MOUNJARO ) 12.5 MG/0.5ML Pen Inject 12.5 mg into the skin once a week. 12/26/23   Danford, Acie Holiday D, NP  trospium (SANCTURA) 20 MG tablet Take 20 mg by mouth 2 (two) times daily. 09/25/14   [provider]  vitamin C (ASCORBIC ACID) 250 MG tablet Take 250 mg by mouth in the morning.    [provider]    Physical Exam: Vitals:   01/16/24 0430 01/16/24 0500 01/16/24 0530 01/16/24 0600  BP: 110/71 125/70 123/72 128/69  Pulse: 71 65 72 66  Resp: 15 15 (!) 21 12  Temp:      TempSrc:      SpO2: 98% 100% 100% 99%  Weight:      Height:        Constitutional: Elderly female currently in no acute distress Eyes: PERRL, lids and conjunctivae normal ENMT: Mucous membranes are moist. Normal dentition.  Neck: normal, supple, no JVD Respiratory: clear  to auscultation bilaterally, no wheezing, no crackles. Normal respiratory effort. No accessory muscle use.  Cardiovascular: Regular rate and rhythm, no murmurs / rubs / gallops. No extremity edema. 2+ pedal pulses. No carotid bruits.  Abdomen: no tenderness, no masses palpated. Bowel sounds positive.  Musculoskeletal: no clubbing / cyanosis. No joint deformity upper and lower extremities. Good ROM, no contractures. Normal muscle tone.  Skin: no rashes, lesions, ulcers. No induration Neurologic: CN 2-12 grossly intact.Strength 5/5 in all 4.  Psychiatric: Normal judgment and insight. Alert and oriented x 3. Normal mood.   Data Reviewed:  EKG revealed sinus rhythm with frequent PVCs at 79 bpm with left axis deviation, RBBB, LVH.  Reviewed labs, imaging, and pertinent records as documented. Assessment and Plan:  NSTEMI Acute.  Presented with complaints of shortness of breath with nausea.  High-sensitivity troponin elevated 4-> 277.  EKG noting a normal sinus rhythm with right bundle branch block and PACs. Chest x-ray showed no acute abnormality.  Noted similar symptoms of dyspnea on exertion during previous event requiring PCI back in 2021.  On patient was started on heparin  drip and cardiology consulted - Admit to a progressive bed - Continue heparin  drip - N.p.o. for need of cardiac cath - Add-on lipid panel  - Trend troponin - Check echocardiogram - Appreciate cardiology consultative services,  will follow-up for further recommendations  CAD Patient with history of right coronary artery PCI in 2021 with residual moderate LAD disease. - Continue aspirin , Plavix , Ranexa , and statin  Hypertensive urgency On admission blood pressures initially elevated up to 183/85. - Continue Coreg  and Ranexa  - Hydralazine  IV as needed for elevated blood pressures  Controlled diabetes mellitus type 2 Last hemoglobin A1c was 5.6 when checked on 6 06/2024.  Home medication regimen includes Mounjaro . -  Continue to monitor  Hyperlipidemia - Add-on lipid panel - Continue Crestor   GERD - Continue Protonix   Obesity, class I BMI 30.47 kg/m. - Continue Mounjaro  in the outpatient setting  OSA - Continue CPAP at night  DVT prophylaxis: Heparin  Advance Care Planning:   Code Status: Full Code   Consults: Cardiology  Family Communication: None requested  Severity of Illness: The appropriate patient status for this patient is OBSERVATION. Observation status is judged to be reasonable and necessary in order to provide the required intensity of service to ensure the patient's safety. The patient's presenting symptoms, physical exam findings, and initial radiographic and laboratory data in the context of their medical condition is felt to place them at decreased risk for further clinical deterioration. Furthermore, it is anticipated that the patient will be medically stable for discharge from the hospital within 2 midnights of admission.   Author: Lena Qualia, MD 01/16/2024 7:39 AM  For on call review [email or web address].

## 2024-01-16 NOTE — ED Notes (Signed)
 Cath lab to collect patient in 5-10 minutes per charge

## 2024-01-16 NOTE — Progress Notes (Signed)
 PHARMACY - ANTICOAGULATION CONSULT NOTE  Pharmacy Consult for Heparin  Indication: chest pain/ACS  Allergies  Allergen Reactions   Amoxicillin-Pot Clavulanate     Other reaction(s): vomiting   Aspirin      Other reaction(s): reflux and nervousness   Colchicine     Stomach cramps Other reaction(s): severe GI upset   Diclofenac     Oral causes stomach cramps   Dulaglutide Diarrhea and Nausea And Vomiting    Other reaction(s): diarrhea   Empagliflozin      Other reaction(s): yeast infection   Ezetimibe     Muscle pain Other reaction(s): muscle aches   Imdur  [Isosorbide  Nitrate] Other (See Comments)    hypotensive   Isosorbide  Other (See Comments)    Causes Hypotension   Metformin      Other reaction(s): stomach upset   Repaglinide      Other reaction(s): swelling and GI upset   Sitagliptin  Phos-Metformin  Hcl Nausea And Vomiting    Lightheaded  Other reaction(s): GI upset   Sulfamethoxazole-Trimethoprim Itching    Other reaction(s): itching   Imipramine Rash   Tape Rash    Paper tape ok to use     Patient Measurements: Height: 5' 3 (160 cm) Weight: 78 kg (172 lb) IBW/kg (Calculated) : 52.4 HEPARIN  DW (KG): 69.3  Vital Signs: Temp: 98.2 F (36.8 C) (06/17 0410) Temp Source: Oral (06/17 0410) BP: 118/66 (06/17 0410) Pulse Rate: 69 (06/17 0410)  Labs: Recent Labs    01/15/24 1332 01/15/24 1718 01/16/24 0441  HGB 14.4  --  13.0  HCT 42.6  --  38.9  PLT 187  --  178  HEPARINUNFRC  --   --  0.23*  CREATININE 0.85  --   --   TROPONINIHS 4 277*  --     Estimated Creatinine Clearance: 56.5 mL/min (by C-G formula based on SCr of 0.85 mg/dL).   Medical History: Past Medical History:  Diagnosis Date   Abnormal nuclear stress test 01/02/2020   Arthritis    Bilateral leg edema 01/02/2020   Bursitis    hips   C. difficile colitis 12/2017   CAD (coronary artery disease) 01/06/2020   CKD (chronic kidney disease), stage III (HCC)    Complication of anesthesia     spinal did not work with second c section   Diabetes mellitus (HCC) 11/19/2015   Diabetes mellitus, type 2 (HCC)    diet controlled - has Rx for Glymipride but has not started taking yet   Dyslipidemia 10/21/2014   GERD (gastroesophageal reflux disease)    History of nonmelanoma skin cancer    History of skin cancer    Hypercholesterolemia 12/11/2019   Hyperlipidemia    Hypertension    Hypertensive disorder 10/21/2014   Lower extremity edema    Mixed hyperlipidemia 01/02/2020   OAB (overactive bladder)    Obesity (BMI 30-39.9) 05/12/2020   OSA (obstructive sleep apnea) 05/12/2020   Osteoarthritis of right knee 05/15/2015   Osteoarthrosis, unspecified whether generalized or localized, involving lower leg 10/21/2014   Osteoporosis    Over weight    Primary osteoarthritis of left knee 12/18/2014   S/P knee replacement 12/18/2014   S/P total knee replacement using cement 05/15/2015   Sleep apnea    Tachycardia 12/11/2019     Assessment: 73 YOF presenting with SOB, CP, and fatigue. Hx of CAD s/p PCI to RCA (2021) with residual moderate LAD disease. No PTA anticoagulation. Pharmacy consulted to dose heparin . CBC stable - Hgb 14.4, PLTc 187  6/17 AM update:  Heparin  level sub-therapeutic    Goal of Therapy:  Heparin  level 0.3-0.7 units/ml Monitor platelets by anticoagulation protocol: Yes   Plan:  Inc heparin  to 950 units/hr Heparin  level in 8 hours  Silvestre Drum, PharmD, BCPS Clinical Pharmacist Phone: [phone]

## 2024-01-16 NOTE — Discharge Summary (Signed)
 Physician Discharge Summary   Patient: Pamela Love MRN: [id number] DOB: [birth year]  Admit date:     01/15/2024  Discharge date: 01/16/24  Discharge Physician: Lena Qualia   PCP: Glena Landau, MD   Recommendations at discharge:   Please follow-up in the outpatient setting with cardiology  Discharge Diagnoses: Principal Problem:   NSTEMI (non-ST elevated myocardial infarction) [facility]) Active Problems:   CAD (coronary artery disease)   Hypertensive urgency   Diabetes mellitus (HCC)   Hypercholesterolemia   GERD (gastroesophageal reflux disease)   Obesity (BMI 30-39.9)   OSA (obstructive sleep apnea)  Resolved Problems:   * No resolved hospital problems. *  Hospital Course: Pamela Love is a 73 y.o. female with medical history significant of hypertension, hyperlipidemia, CAD s/p RCA PCI in 2021, DM type II, OSA, and obesity who presented with complaints of shortness of breath.   She experienced shortness of breath yesterday while driving to [facility] to meet a friend for lunch. Palpitations occurred the night before while sitting on her bed with an iPad, described as feeling her heartbeat strongly for about three minutes.   She also experienced nausea, an inability to eat lunch due to feeling that she might vomit, a headache, and lightheadedness when getting into the car, though not experiencing spinning dizziness. No chest pain or significant leg swelling was noted. Her weight has been stable, with a fluctuation of about five pounds.   She has a history of coronary artery disease, having had two stents placed in one artery during the COVID pandemic after experiencing shortness of breath while climbing stairs. There is a significant family history of heart disease, including a younger sister who had heart bypass surgery at around age 72, a father who died of a heart attack at 54, and other relatives with heart issues.   Her diabetes is managed with  Mounjaro  once a week on Thursdays, and her last hemoglobin A1c was 5.6. She occasionally uses alcohol and does not use drugs or smoke cigarettes. Assessment and Plan:  NSTEMI Acute.  Presented with complaints of shortness of breath with nausea.  High-sensitivity troponin elevated 4-> 277.  EKG noting a normal sinus rhythm with right bundle branch block and PACs. Chest x-ray showed no acute abnormality.  Noted similar symptoms of dyspnea on exertion during previous event requiring PCI back in 2021.  On patient was started on heparin  drip and cardiology consulted.  Patient was taken for cardiac cath which revealed nonobstructive coronary artery disease with moderate stenosis of the mid LAD similar to prior for which medical management was recommended.  Symptoms thought to be secondary to a type II MI in the setting of uncontrolled blood pressures.  Patient was cleared from a cardiac standpoint for discharge.   CAD Patient with history of right coronary artery PCI in 2021 with residual moderate LAD disease. - Continue aspirin , Plavix , Ranexa , and statin   Hypertensive urgency On admission blood pressures initially elevated up to 183/85. - Continue Coreg  and Ranexa  - Recommended patient to follow-up with primary care provider in regards to blood pressure management.   Controlled diabetes mellitus type 2 Last hemoglobin A1c was 5.6 when checked on 01/10/2024.  Home medication regimen includes Mounjaro . - Continue to monitor   Hyperlipidemia Lipid panel revealed total cholesterol to be 130, HDL 52, LDL 64, triglycerides 68, and VLDL 14. - Continue Crestor    GERD - Continue Protonix    Obesity, class I BMI 30.47 kg/m. - Continue Mounjaro  in the outpatient setting  OSA - Continue CPAP at night       Consultants: Cardiology Procedures performed: Cardiac catheterization Disposition: Home Diet recommendation:  Discharge Diet Orders (From admission, onward)     Start     Ordered    01/16/24 0000  Diet - low sodium heart healthy        01/16/24 1105           Cardiac diet DISCHARGE MEDICATION: Allergies as of 01/16/2024       Reactions   Amoxicillin-pot Clavulanate    Other reaction(s): vomiting   Aspirin     Other reaction(s): reflux and nervousness   Colchicine    Stomach cramps Other reaction(s): severe GI upset   Diclofenac    Oral causes stomach cramps   Dulaglutide Diarrhea, Nausea And Vomiting   Other reaction(s): diarrhea   Empagliflozin     Other reaction(s): yeast infection   Ezetimibe    Muscle pain Other reaction(s): muscle aches   Imdur  [isosorbide  Nitrate] Other (See Comments)   hypotensive   Isosorbide  Other (See Comments)   Causes Hypotension   Metformin     Other reaction(s): stomach upset   Repaglinide     Other reaction(s): swelling and GI upset   Sitagliptin  Phos-metformin  Hcl Nausea And Vomiting   Lightheaded  Other reaction(s): GI upset   Sulfamethoxazole-trimethoprim Itching   Other reaction(s): itching   Imipramine Rash   Tape Rash   Paper tape ok to use         Medication List     TAKE these medications    Accu-Chek Guide Me w/Device Kit 1 each by Does not apply route 2 (two) times daily. E11.9   Accu-Chek Guide test strip Generic drug: glucose blood Use to check BS 2x a day   Accu-Chek Softclix Lancets lancets USE TO CHECK BLOOD SUGAR TWICE DAILY   acetaminophen  650 MG CR tablet Commonly known as: TYLENOL  Take 1,300 mg by mouth at bedtime.   aspirin  EC 81 MG tablet Take 1 tablet (81 mg total) by mouth in the morning.   carvedilol  3.125 MG tablet Commonly known as: COREG  TAKE 1 TABLET BY MOUTH TWICE A DAY WITH A MEAL   clopidogrel  75 MG tablet Commonly known as: PLAVIX  TAKE 1 TABLET BY MOUTH EVERY DAY WITH BREAKFAST   CoQ10 200 MG Caps Take 200 mg by mouth in the morning.   DULoxetine  20 MG capsule Commonly known as: CYMBALTA  Take 40 mg by mouth at bedtime.   levocetirizine 5 MG  tablet Commonly known as: XYZAL  Take 1 tablet (5 mg total) by mouth every evening.   Mounjaro  12.5 MG/0.5ML Pen Generic drug: tirzepatide  Inject 12.5 mg into the skin once a week.   multivitamin with minerals Tabs tablet Take 1 tablet by mouth every morning.   nitroGLYCERIN  0.4 MG SL tablet Commonly known as: NITROSTAT  PLACE 1 TABLET (0.4 MG TOTAL) UNDER THE TONGUE EVERY 5 (FIVE) MINUTES X 3 DOSES AS NEEDED FOR CHEST PAIN.   pantoprazole  40 MG tablet Commonly known as: PROTONIX  Take 40 mg by mouth daily.   RA Fish Oil 1400 MG Cpdr Take 1,400 mg by mouth 2 (two) times daily.   ranolazine  1000 MG SR tablet Commonly known as: RANEXA  TAKE 1 TABLET BY MOUTH TWICE A DAY   rosuvastatin  40 MG tablet Commonly known as: CRESTOR  TAKE 1 TABLET (40 MG TOTAL) BY MOUTH IN THE MORNING   saccharomyces boulardii 250 MG capsule Commonly known as: FLORASTOR Take 250 mg by mouth in the morning.   trospium  20 MG tablet Commonly known as: SANCTURA Take 20 mg by mouth 2 (two) times daily.   vitamin C 250 MG tablet Commonly known as: ASCORBIC ACID Take 250 mg by mouth in the morning.   Vitamin D  50 MCG (2000 UT) Caps Take 1 capsule (2,000 Units total) by mouth every morning.        Discharge Exam: Filed Weights   01/15/24 1330  Weight: 78 kg    Constitutional: Elderly female currently in NAD, calm, comfortable Eyes: PERRL, lids and conjunctivae normal ENMT: Mucous membranes are moist. Posterior pharynx clear of any exudate or lesions.Normal dentition.  Neck: normal, supple  Respiratory: clear to auscultation bilaterally, no wheezing, no crackles. Normal respiratory effort. No accessory muscle use.  Cardiovascular: Regular rate and rhythm, no murmurs / rubs / gallops.   Abdomen: no tenderness, no masses palpated Bowel sounds positive.  Musculoskeletal: no clubbing / cyanosis. No joint deformity upper and lower extremities. Good ROM, no contractures.   Skin: no rashes, lesions,  ulcers. No induration Neurologic: CN 2-12 grossly intact.  Strength 5/5 in all 4.  Psychiatric: Normal judgment and insight. Alert and oriented x 3. Normal mood.    Condition at discharge: stable  The results of significant diagnostics from this hospitalization (including imaging, microbiology, ancillary and laboratory) are listed below for reference.   Imaging Studies: CARDIAC CATHETERIZATION Result Date: 01/16/2024   Prox LAD to Mid LAD lesion is 60% stenosed.   Non-stenotic Prox RCA to Mid RCA lesion was previously treated.   The left ventricular systolic function is normal.   LV end diastolic pressure is mildly elevated.   The left ventricular ejection fraction is 55-65% by visual estimate. Nonobstructive CAD. There is a moderate stenosis in the mid LAD. Direct DFR in 2021 was ok. Cath works FFR today is 0.9 indicating the lesion in not hemodynamically significant. RCA stents are widely patent Normal overall LV function with inferior HK Mildly elevated LVEDP 18 mm Hg Plan: suspect his is type 2 MI related to uncontrolled HTN. Continue medical management. Should be stable for DC today.   DG Chest 1 View Result Date: 01/15/2024 CLINICAL DATA:  Shortness of breath. EXAM: CHEST  1 VIEW COMPARISON:  03/16/2005. FINDINGS: Low lung volumes. The cardiomediastinal silhouette is otherwise within normal limits. No focal consolidation, pleural effusion, or pneumothorax. Diffuse osseous demineralization. No acute osseous abnormality identified. IMPRESSION: No acute cardiopulmonary findings. Electronically Signed   By: Mannie Seek M.D.   On: 01/15/2024 14:20    Microbiology: Results for orders placed or performed during the hospital encounter of 01/03/20  SARS CORONAVIRUS 2 (TAT 6-24 HRS) Nasopharyngeal Nasopharyngeal Swab     Status: None   Collection Time: 01/03/20  1:34 PM   Specimen: Nasopharyngeal Swab  Result Value Ref Range Status   SARS Coronavirus 2 NEGATIVE NEGATIVE Final    Comment:  (NOTE) SARS-CoV-2 target nucleic acids are NOT DETECTED. The SARS-CoV-2 RNA is generally detectable in upper and lower respiratory specimens during the acute phase of infection. Negative results do not preclude SARS-CoV-2 infection, do not rule out co-infections with other pathogens, and should not be used as the sole basis for treatment or other patient management decisions. Negative results must be combined with clinical observations, patient history, and epidemiological information. The expected result is Negative. Fact Sheet for Patients: HairSlick.no Fact Sheet for Healthcare Providers: quierodirigir.com This test is not yet approved or cleared by the [place]  FDA and  has been authorized for detection and/or diagnosis of SARS-CoV-2  by FDA under an Emergency Use Authorization (EUA). This EUA will remain  in effect (meaning this test can be used) for the duration of the COVID-19 declaration under Section 56 4(b)(1) of the Act, 21 U.S.C. section 360bbb-3(b)(1), unless the authorization is terminated or revoked sooner. Performed at [facility], [address], [place], [place] [address]     Labs: CBC: Recent Labs  Lab 01/15/24 1332 01/16/24 0441  WBC 5.7 7.0  HGB 14.4 13.0  HCT 42.6 38.9  MCV 97.0 96.5  PLT 187 178   Basic Metabolic Panel: Recent Labs  Lab 01/10/24 1523 01/15/24 1332 01/16/24 0600  NA 141 138 140  K 4.3 4.3 3.9  CL 106 108 109  CO2 18* 22 22  GLUCOSE 86 138* 110*  BUN 27 29* 26*  CREATININE 0.87 0.85 0.92  CALCIUM  9.7 9.6 9.3  MG  --   --  1.8   Liver Function Tests: Recent Labs  Lab 01/10/24 1523 01/16/24 0600  AST 24 22  ALT 30 27  ALKPHOS 108 71  BILITOT 0.5 0.6  PROT 6.5 5.7*  ALBUMIN 4.5 3.4*   CBG: Recent Labs  Lab 01/16/24 0603  GLUCAP 105*    Discharge time spent: less than 30 minutes.  Signed: Lena Qualia, MD [facility] 01/16/2024

## 2024-01-16 NOTE — ED Notes (Signed)
 Pt ambulated to restroom without assistance.

## 2024-01-16 NOTE — Interval H&P Note (Signed)
 History and Physical Interval Note:  01/16/2024 9:29 AM  Pamela Love  has presented today for surgery, with the diagnosis of NSTEMI.  The various methods of treatment have been discussed with the patient and family. After consideration of risks, benefits and other options for treatment, the patient has consented to  Procedure(s): LEFT HEART CATH AND CORONARY ANGIOGRAPHY (N/A) as a surgical intervention.  The patient's history has been reviewed, patient examined, no change in status, stable for surgery.  I have reviewed the patient's chart and labs.  Questions were answered to the patient's satisfaction.   Cath Lab Visit (complete for each Cath Lab visit)  Clinical Evaluation Leading to the Procedure:   ACS: Yes.    Non-ACS:    Anginal Classification: CCS IV  Anti-ischemic medical therapy: Maximal Therapy (2 or more classes of medications)  Non-Invasive Test Results: No non-invasive testing performed  Prior CABG: No previous CABG        Prince Brooking, [facility] 01/16/2024 9:29 AM

## 2024-01-16 NOTE — Discharge Instructions (Signed)

## 2024-01-16 NOTE — Progress Notes (Signed)
 Pt was educated on Type II MI, restrictions, ex guidelines, s/s to stop exercising, NTG use and calling 911, heart healthy diet, risk factors and CRPII. Pt received MI book and materials on exercise, diet, and CRPII. Will refer to [facility].    Pt was currently in the maintenance program at [facility].  Barkley Li MS, ACSM-CEP  01/16/2024 2:36 PM

## 2024-01-16 NOTE — Progress Notes (Signed)
  Carryover admission to the Day Admitter.  I discussed this case with the EDP, Dr. Alison Irvine.  Per these discussions:   This is a 73 year old female with history of coronary artery disease status post PCI with stent placement, essential hypertension, type 2 diabetes mellitus, who is being admitted with NSTEMI after presenting with acute episode of shortness of brea breath th associated with dizziness, lightheadedness, presyncope, without any corresponding chest pain.  Initial troponin was 4, with repeat troponin trending up to 277.  EKG without reported acute ischemic changes.  daytime EDP discussed patient's case with on-call cardiology, Dr. Mallipeddi, who has seen patient and formally consulted, recommending admission for NSTEMI with acs protocol, including heparin  drip, and conveys that plan is for left heart cath in the AM (6/17), requesting that patient be NPO after MN for this.   Heparin  drip has been initiated, and orders for n.p.o. after midnight are already in place.  I have placed an order for inpatient admission to PCU for further evaluation management of the above.  I have placed some additional preliminary admit orders via the adult multi-morbid admission order set. I have also added CMP and magnesium  level to morning labs, which already included CBC for monitoring of heparin  drip.  Additionally, in the setting of her history of type 2 diabetes mellitus and current n.p.o. status, I have added CBG monitoring every 6 hours with very low-dose sliding scale insulin .  Most recent hemoglobin A1c was noted to be 5.6% on 01/10/2024.    Camelia Cavalier, DO Hospitalist

## 2024-01-17 ENCOUNTER — Encounter (HOSPITAL_COMMUNITY): Payer: Self-pay | Admitting: Cardiology

## 2024-01-17 ENCOUNTER — Telehealth (HOSPITAL_BASED_OUTPATIENT_CLINIC_OR_DEPARTMENT_OTHER): Payer: Self-pay | Admitting: Cardiology

## 2024-01-17 NOTE — Telephone Encounter (Signed)
 Called and spoke to pt. Full name and DOB verified.  Pt scheduled to see Dr. Veryl Gottron on 7/1 @ 9:20 for post cath f/u. Pt aware.

## 2024-01-17 NOTE — Telephone Encounter (Signed)
 Patient called to report to Dr. Veryl Gottron she had a mild heart attack on Monday, 6/16 and had a catherization.  Patient wants a call back regarding next steps.

## 2024-01-23 ENCOUNTER — Encounter (HOSPITAL_BASED_OUTPATIENT_CLINIC_OR_DEPARTMENT_OTHER): Payer: Self-pay

## 2024-01-27 ENCOUNTER — Other Ambulatory Visit: Payer: Self-pay | Admitting: Cardiology

## 2024-01-27 DIAGNOSIS — I2510 Atherosclerotic heart disease of native coronary artery without angina pectoris: Secondary | ICD-10-CM

## 2024-01-29 ENCOUNTER — Telehealth (HOSPITAL_COMMUNITY): Payer: Self-pay

## 2024-01-29 NOTE — Telephone Encounter (Signed)
Per phase I cardiac rehab, fax referral to [place].

## 2024-01-30 ENCOUNTER — Encounter (HOSPITAL_BASED_OUTPATIENT_CLINIC_OR_DEPARTMENT_OTHER): Payer: Self-pay | Admitting: Cardiology

## 2024-01-30 ENCOUNTER — Ambulatory Visit (HOSPITAL_BASED_OUTPATIENT_CLINIC_OR_DEPARTMENT_OTHER): Admitting: Cardiology

## 2024-01-30 VITALS — BP 116/68 | HR 72 | Ht 63.0 in | Wt 174.1 lb

## 2024-01-30 DIAGNOSIS — I10 Essential (primary) hypertension: Secondary | ICD-10-CM

## 2024-01-30 DIAGNOSIS — Z09 Encounter for follow-up examination after completed treatment for conditions other than malignant neoplasm: Secondary | ICD-10-CM | POA: Diagnosis not present

## 2024-01-30 DIAGNOSIS — E1169 Type 2 diabetes mellitus with other specified complication: Secondary | ICD-10-CM | POA: Diagnosis not present

## 2024-01-30 DIAGNOSIS — I2510 Atherosclerotic heart disease of native coronary artery without angina pectoris: Secondary | ICD-10-CM | POA: Diagnosis not present

## 2024-01-30 DIAGNOSIS — R0602 Shortness of breath: Secondary | ICD-10-CM

## 2024-01-30 DIAGNOSIS — E782 Mixed hyperlipidemia: Secondary | ICD-10-CM | POA: Diagnosis not present

## 2024-01-30 DIAGNOSIS — Z794 Long term (current) use of insulin: Secondary | ICD-10-CM

## 2024-01-30 MED ORDER — CARVEDILOL 3.125 MG PO TABS: 3.1250 mg | ORAL_TABLET | Freq: Two times a day (BID) | ORAL | 3 refills | Status: AC

## 2024-01-30 NOTE — Progress Notes (Signed)
 Cardiology Office Note:  .   Date:  01/30/2024  ID:  Pamela Love, DOB Jul 09, 1951, MRN [id number] PCP: Loreli Kins, MD  [facility] Providers Cardiologist:  Shelda Bruckner, MD {  History of Present Illness: .   Pamela Love is a 73 y.o. female with a hx of arthritis, CAD, CKD stage IIIa, diabetes type 2,  hyperlipidemia, hypertension, obesity, and OSA on CPAP who is seen for follow-up. She was previously a patient of Dr. Sheena.      Prior clinical ASCVD: CAD. From May 11-June 27th 2022 she had 5 anginal attacks. Since her stent placement in 01/2021 her symptoms are much reduced. Comorbid conditions: CKD stage IIIa, diabetes type 2, hyperlipidemia, hypertension, metabolic syndrome  Today: Reviewed her recent hospitalization. Felt clammy, trouble breathing, head felt off. Was on her way to visit her friend, then went to the ER. Had been going on for about 30 or a little longer before she went to the hospital. Reviewed her hospitalization, elevated troponins, cardiac cath. Spent extensive time discussing difference between type 1 and type 2 MI.  Feels tired, short of breath, but different than when she was in the hospital. Feels like when she takes a breath in, she can't get it in deep enough. Not related to times she is exercising. Plans to get back to cardiac rehab.  Brings blood pressure log today. Overall well controlled, ranges 106-137/68/84 except for one day when she was feeling poorly, BP 132-168/81-91.   ROS: Denies shortness of breath with normal exertion. No PND, orthopnea, LE edema or unexpected weight gain. No syncope or palpitations. ROS otherwise negative except as noted.   Studies Reviewed: SABRA    EKG:       Physical Exam:   VS:  BP 116/68   Pulse 72   Ht 5' 3 (1.6 m)   Wt 174 lb 1.6 oz (79 kg)   SpO2 99%   BMI 30.84 kg/m    Wt Readings from Last 3 Encounters:  01/30/24 174 lb 1.6 oz (79 kg)  01/15/24 172 lb (78 kg)  01/10/24 171 lb  (77.6 kg)    GEN: Well nourished, well developed in no acute distress HEENT: Normal, moist mucous membranes NECK: No JVD CARDIAC: regular rhythm, normal S1 and S2, no rubs or gallops. No murmur. VASCULAR: Radial and DP pulses 2+ bilaterally. No carotid bruits RESPIRATORY:  Clear to auscultation without rales, wheezing or rhonchi  ABDOMEN: Soft, non-tender, non-distended MUSCULOSKELETAL:  Ambulates independently SKIN: Warm and dry, no edema NEUROLOGIC:  Alert and oriented x 3. No focal neuro deficits noted. PSYCHIATRIC:  Normal affect    ASSESSMENT AND PLAN: .    Post hospital follow up for type II MI -clear for cardiac rehab from my perspective -unclear trigger for her symptoms. No obstructive CAD on cath.  -suspect her BP elevation was secondary to feeling poorly, as it is well controlled at baseline. She had one day on her log when she felt poorly, and her BP was also somewhat elevated.  Dyspnea -not with exertion, more of a sensation of being unable to take a deep breath -exam unremarkable, appears euvolemic -will order echo as this was not able to be done while admitted -O2 levels normal. Recommended checking O2 with pulse ox at home when she feels short of breath -discussed potential noncardiac etiologies of symptoms -if generalized fatigue/feeling poorly continue, recommend she follow up with PCP  CAD, with prior PCI Mixed hyperlipidemia Type II diabetes Obesity, BMI 30 (  starting BMI 40 in 2021) -Aspirin  81 mg, clopidogrel  75 mg -statin: continue rosuvastatin  40 mg daily -LDL goal <70, LDL 64 12/2023 -antianginals: beta blocker (carvedilol ), ranolazine . Not on isosorbide  as it made her hypotensive -Given type II diabetes and CAD, she is on GLP1RA. Tolerates mounjaro  better than ozempic  -renewed prescription for sublingual nitroglycerin , counseled on lifespan of this medication -risk factor modification, as below -counseled on red flag warning signs that need immediate  medical attention    Hypertension:  -average at goal of <130/80, continue carvedilol . Had issues with hypotension in the past, would use home averages and not increase therapy unless consistently elevated   OSA: -followed by Dr. Burnard, benefits from CPAP therapy for her OSA.  CV risk counseling and prevention -recommend heart healthy/Mediterranean diet, with whole grains, fruits, vegetable, fish, lean meats, nuts, and olive oil. Limit salt. -recommend moderate walking, 3-5 times/week for 30-50 minutes each session. Aim for at least 150 minutes.week. Goal should be pace of 3 miles/hours, or walking 1.5 miles in 30 minutes -recommend avoidance of tobacco products. Avoid excess alcohol.  Dispo: 3 mos or sooner as needed  Total time of encounter: I spent 45 minutes dedicated to the care of this patient on the date of this encounter to include pre-visit review of records, face-to-face time with the patient discussing conditions above, and clinical documentation with the electronic health record. We specifically spent time today discussing hospitalization, cath results, types of MI and how they differ, BP, symptoms.   Signed, Shelda Bruckner, MD   Shelda Bruckner, MD, PhD, [facility]  [facility]  [place]  Heart & Vascular at [facility] at [facility] [address], [address] [place], [place] [address] [phone]

## 2024-01-30 NOTE — Patient Instructions (Signed)
 Medication Instructions:  Your physician recommends that you continue on your current medications as directed. Please refer to the Current Medication list given to you today.  *If you need a refill on your cardiac medications before your next appointment, please call your pharmacy*  Lab Work: NONE  Testing/Procedures: Your physician has requested that you have an echocardiogram. Echocardiography is a painless test that uses sound waves to create images of your heart. It provides your doctor with information about the size and shape of your heart and how well your heart's chambers and valves are working. This procedure takes approximately one hour. There are no restrictions for this procedure. Please do NOT wear cologne, perfume, aftershave, or lotions (deodorant is allowed). Please arrive 15 minutes prior to your appointment time.  Please note: We ask at that you not bring children with you during ultrasound (echo/ vascular) testing. Due to room size and safety concerns, children are not allowed in the ultrasound rooms during exams. Our front office staff cannot provide observation of children in our lobby area while testing is being conducted. An adult accompanying a patient to their appointment will only be allowed in the ultrasound room at the discretion of the ultrasound technician under special circumstances. We apologize for any inconvenience.   Follow-Up: At [facility], you and your health needs are our priority.  As part of our continuing mission to provide you with exceptional heart care, we have created designated Provider Care Teams.  These Care Teams include your primary Cardiologist (physician) and Advanced Practice Providers (APPs -  Physician Assistants and Nurse Practitioners) who all work together to provide you with the care you need, when you need it.  We recommend signing up for the patient portal called MyChart.  Sign up information is provided on this After Visit  Summary.  MyChart is used to connect with patients for Virtual Visits (Telemedicine).  Patients are able to view lab/test results, encounter notes, upcoming appointments, etc.  Non-urgent messages can be sent to your provider as well.   To learn more about what you can do with MyChart, go to [email or web address].    Your next appointment:   3 month(s)  The format for your next appointment:   In Person  Provider:   Shelda Bruckner MD, Reche ORN NP, or Rosaline RAMAN NP

## 2024-01-31 ENCOUNTER — Encounter (INDEPENDENT_AMBULATORY_CARE_PROVIDER_SITE_OTHER): Payer: Self-pay

## 2024-01-31 ENCOUNTER — Ambulatory Visit (INDEPENDENT_AMBULATORY_CARE_PROVIDER_SITE_OTHER): Admitting: Family Medicine

## 2024-01-31 ENCOUNTER — Encounter (HOSPITAL_BASED_OUTPATIENT_CLINIC_OR_DEPARTMENT_OTHER): Payer: Self-pay

## 2024-02-06 DIAGNOSIS — C44729 Squamous cell carcinoma of skin of left lower limb, including hip: Secondary | ICD-10-CM | POA: Diagnosis not present

## 2024-02-06 DIAGNOSIS — D485 Neoplasm of uncertain behavior of skin: Secondary | ICD-10-CM | POA: Diagnosis not present

## 2024-02-06 DIAGNOSIS — L570 Actinic keratosis: Secondary | ICD-10-CM | POA: Diagnosis not present

## 2024-02-09 ENCOUNTER — Other Ambulatory Visit (HOSPITAL_BASED_OUTPATIENT_CLINIC_OR_DEPARTMENT_OTHER): Payer: Self-pay | Admitting: Family

## 2024-02-09 DIAGNOSIS — I2510 Atherosclerotic heart disease of native coronary artery without angina pectoris: Secondary | ICD-10-CM

## 2024-02-19 DIAGNOSIS — L089 Local infection of the skin and subcutaneous tissue, unspecified: Secondary | ICD-10-CM | POA: Diagnosis not present

## 2024-02-20 ENCOUNTER — Ambulatory Visit (INDEPENDENT_AMBULATORY_CARE_PROVIDER_SITE_OTHER): Admitting: Adult Health

## 2024-02-20 ENCOUNTER — Encounter (INDEPENDENT_AMBULATORY_CARE_PROVIDER_SITE_OTHER): Payer: Self-pay

## 2024-02-21 ENCOUNTER — Ambulatory Visit (INDEPENDENT_AMBULATORY_CARE_PROVIDER_SITE_OTHER): Admitting: Adult Health

## 2024-02-21 ENCOUNTER — Other Ambulatory Visit (HOSPITAL_BASED_OUTPATIENT_CLINIC_OR_DEPARTMENT_OTHER): Payer: Self-pay

## 2024-02-21 ENCOUNTER — Encounter (INDEPENDENT_AMBULATORY_CARE_PROVIDER_SITE_OTHER): Payer: Self-pay | Admitting: Adult Health

## 2024-02-21 VITALS — BP 118/70 | HR 72 | Temp 98.4°F | Ht 63.0 in | Wt 168.0 lb

## 2024-02-21 DIAGNOSIS — I152 Hypertension secondary to endocrine disorders: Secondary | ICD-10-CM | POA: Diagnosis not present

## 2024-02-21 DIAGNOSIS — E669 Obesity, unspecified: Secondary | ICD-10-CM | POA: Diagnosis not present

## 2024-02-21 DIAGNOSIS — Z0000 Encounter for general adult medical examination without abnormal findings: Secondary | ICD-10-CM

## 2024-02-21 DIAGNOSIS — E1169 Type 2 diabetes mellitus with other specified complication: Secondary | ICD-10-CM | POA: Diagnosis not present

## 2024-02-21 DIAGNOSIS — E559 Vitamin D deficiency, unspecified: Secondary | ICD-10-CM | POA: Diagnosis not present

## 2024-02-21 DIAGNOSIS — E1159 Type 2 diabetes mellitus with other circulatory complications: Secondary | ICD-10-CM | POA: Diagnosis not present

## 2024-02-21 DIAGNOSIS — Z7985 Long-term (current) use of injectable non-insulin antidiabetic drugs: Secondary | ICD-10-CM | POA: Diagnosis not present

## 2024-02-21 DIAGNOSIS — Z6829 Body mass index (BMI) 29.0-29.9, adult: Secondary | ICD-10-CM

## 2024-02-21 DIAGNOSIS — I25118 Atherosclerotic heart disease of native coronary artery with other forms of angina pectoris: Secondary | ICD-10-CM

## 2024-02-21 DIAGNOSIS — Z6841 Body Mass Index (BMI) 40.0 and over, adult: Secondary | ICD-10-CM

## 2024-02-21 DIAGNOSIS — Z6830 Body mass index (BMI) 30.0-30.9, adult: Secondary | ICD-10-CM

## 2024-02-21 MED ORDER — MOUNJARO 12.5 MG/0.5ML ~~LOC~~ SOAJ
12.5000 mg | SUBCUTANEOUS | 0 refills | Status: DC
  Filled 2024-02-21 – 2024-03-05 (×2): qty 6, 84d supply, fill #0

## 2024-02-21 NOTE — Progress Notes (Signed)
 WEIGHT SUMMARY AND BIOMETRICS  Vitals Temp: 98.4 F (36.9 C) BP: 118/70 Pulse Rate: 72 SpO2: 98 %   Anthropometric Measurements Height: 5' 3 (1.6 m) Weight: 168 lb (76.2 kg) BMI (Calculated): 29.77 Weight at Last Visit: 171 lb Weight Lost Since Last Visit: 3 lb Weight Gained Since Last Visit: 0 Starting Weight: 227 lb Total Weight Loss (lbs): 59 lb (26.8 kg) Peak Weight: 250 lb   Body Composition  Body Fat %: 43.5 % Fat Mass (lbs): 73.4 lbs Muscle Mass (lbs): 90.6 lbs Total Body Water (lbs): 74 lbs Visceral Fat Rating : 13   Other Clinical Data Fasting: no Labs: no Today's Visit #: 80 Starting Date: 03/17/20    Chief Complaint:   OBESITY Pamela Love is here to discuss her progress with her obesity treatment plan.  She is on the the Category 1 Plan and states she is following her eating plan approximately 75 % of the time.  She states she is exercising: None  Interim History:   01/15/2024 NSTEMI- reviewed admission notes and recent Cardiology OV notes  She is looking forward to beginning Cardiac Rehab.  She endorses persistent fatigue, denies acute cardiac sx's  She is scheduled for ECHO on 03/04/2024  Reviewed Bioimpedance Results with pt: Muscle Mass: -0.6 lb Adipose Mass: -2.4 lbs  Subjective:   1. Coronary artery disease involving native coronary artery of native heart with other form of angina pectoris (HCC) 01/15/2024 Hospital Course: Pamela Love is a 73 y.o. female with medical history significant of hypertension, hyperlipidemia, CAD s/p RCA PCI in 2021, DM type II, OSA, and obesity who presented with complaints of shortness of breath.   She experienced shortness of breath yesterday while driving to [facility] to meet a friend for lunch. Palpitations occurred the night before while sitting on her bed with an iPad, described as feeling her heartbeat strongly for about three minutes.   She also experienced nausea, an inability to eat  lunch due to feeling that she might vomit, a headache, and lightheadedness when getting into the car, though not experiencing spinning dizziness. No chest pain or significant leg swelling was noted. Her weight has been stable, with a fluctuation of about five pounds.   She has a history of coronary artery disease, having had two stents placed in one artery during the COVID pandemic after experiencing shortness of breath while climbing stairs. There is a significant family history of heart disease, including a younger sister who had heart bypass surgery at around age 76, a father who died of a heart attack at 65, and other relatives with heart issues.   Her diabetes is managed with Mounjaro  once a week on Thursdays, and her last hemoglobin A1c was 5.6. She occasionally uses alcohol and does not use drugs or smoke cigarettes. Assessment and Plan:   NSTEMI Acute.  Presented with complaints of shortness of breath with nausea.  High-sensitivity troponin elevated 4-> 277.  EKG noting a normal sinus rhythm with right bundle branch block and PACs. Chest x-ray showed no acute abnormality.  Noted similar symptoms of dyspnea on exertion during previous event requiring PCI back in 2021.  On patient was started on heparin  drip and cardiology consulted.  Patient was taken for cardiac cath which revealed nonobstructive coronary artery disease with moderate stenosis of the mid LAD similar to prior for which medical management was recommended.  Symptoms thought to be secondary to a type II MI in the setting of uncontrolled blood pressures.  Patient  was cleared from a cardiac standpoint for discharge.   CAD Patient with history of right coronary artery PCI in 2021 with residual moderate LAD disease. - Continue aspirin , Plavix , Ranexa , and statin    2. Type 2 diabetes mellitus with obesity (HCC) Discussed Labs  Latest Reference Range & Units 01/10/24 15:23  Glucose 70 - 99 mg/dL 86  Hemoglobin J8R 4.8 -  5.6 % 5.6  Est. average glucose Bld gHb Est-mCnc mg/dL 885    Latest Reference Range & Units 01/10/24 15:23  Vitamin B12 232 - 1,245 pg/mL 907   A1c, CBG< B12- all at goal She is on weekly Mounjaro  12.5mg  Denies mass in neck, dysphagia, dyspepsia, persistent hoarseness, abdominal pain, or N/V/C  Home readings fasting 123,125 PP: 82,88 She denies sx's of hypoglycemia  3. Hypertension associated with type 2 diabetes mellitus (HCC) 01/10/2024 CMP- Kidney fx, Liver Enzymes, Electrolytes- stable Home readings: SBP: 110-130s DBP: 60-90s She denies acute cardiac sx's at present  4. Vitamin D  deficiency Discussed Labs  Latest Reference Range & Units 01/10/24 15:23  Vitamin D , 25-Hydroxy 30.0 - 100.0 ng/mL 54.7   Vit D Level at goal She is on daily OTC Vit D3 2,000 international units   5. Healthcare Mx Discussed Labs  Latest Reference Range & Units 01/10/24 15:23  TSH 0.450 - 4.500 uIU/mL 2.490  T4,Free(Direct) 0.82 - 1.77 ng/dL 8.81   Thyroid panel is stable  Assessment/Plan:   1. Coronary artery disease involving native coronary artery of native heart with other form of angina pectoris [facility]) Start Cardiac rehab as directed F/u with Cardiac care team  2. Type 2 diabetes mellitus with obesity (HCC) (Primary) Refill tirzepatide  (MOUNJARO ) 12.5 MG/0.5ML Pen Inject 12.5 mg into the skin once a week. Dispense: 6 mL, Refills: 0 of 0 remaining   3. Hypertension associated with type 2 diabetes mellitus (HCC) Continue current cardiac regime per her Cardiology team  4. Vitamin D  deficiency Continue current OTC supplementation  5.  Healthcare Mx Monitor labs  6. BMI 30.0-30.9,adult -- Current BMI 29.9  Pamela Love is currently in the action stage of change. As such, her goal is to maintain weight for now. She has agreed to the Category 1 Plan.   Exercise goals: No exercise has been prescribed at this time.  Behavioral modification strategies: increasing lean protein intake,  decreasing simple carbohydrates, increasing vegetables, increasing water intake, no skipping meals, keeping healthy foods in the home, and planning for success.  Pamela Love has agreed to follow-up with our clinic in 4 weeks. She was informed of the importance of frequent follow-up visits to maximize her success with intensive lifestyle modifications for her multiple health conditions.   Objective:   Blood pressure 118/70, pulse 72, temperature 98.4 F (36.9 C), height 5' 3 (1.6 m), weight 168 lb (76.2 kg), SpO2 98%. Body mass index is 29.76 kg/m.  General: Cooperative, alert, well developed, in no acute distress. HEENT: Conjunctivae and lids unremarkable. Cardiovascular: Regular rhythm.  Lungs: Normal work of breathing. Neurologic: No focal deficits.   Lab Results  Component Value Date   CREATININE 0.92 01/16/2024   BUN 26 (H) 01/16/2024   NA 140 01/16/2024   K 3.9 01/16/2024   CL 109 01/16/2024   CO2 22 01/16/2024   Lab Results  Component Value Date   ALT 27 01/16/2024   AST 22 01/16/2024   ALKPHOS 71 01/16/2024   BILITOT 0.6 01/16/2024   Lab Results  Component Value Date   HGBA1C 5.6 01/10/2024   HGBA1C 5.5  09/14/2023   HGBA1C 5.5 06/19/2023   HGBA1C 5.5 03/14/2023   HGBA1C 5.7 (H) 12/19/2022   No results found for: INSULIN  Lab Results  Component Value Date   TSH 2.490 01/10/2024   Lab Results  Component Value Date   CHOL 130 01/16/2024   HDL 52 01/16/2024   LDLCALC 64 01/16/2024   TRIG 68 01/16/2024   CHOLHDL 2.5 01/16/2024   Lab Results  Component Value Date   VD25OH 54.7 01/10/2024   VD25OH 61.9 06/19/2023   VD25OH 77.1 12/19/2022   Lab Results  Component Value Date   WBC 7.0 01/16/2024   HGB 13.0 01/16/2024   HCT 38.9 01/16/2024   MCV 96.5 01/16/2024   PLT 178 01/16/2024   No results found for: IRON, TIBC, FERRITIN  Attestation Statements:   Reviewed by clinician on day of visit: allergies, medications, problem list, medical history,  surgical history, family history, social history, and previous encounter notes.  I have reviewed the above documentation for accuracy and completeness, and I agree with the above. -  Jamell Laymon d. Bengie Kaucher, NP-C

## 2024-02-27 DIAGNOSIS — E119 Type 2 diabetes mellitus without complications: Secondary | ICD-10-CM | POA: Diagnosis not present

## 2024-02-27 DIAGNOSIS — I2510 Atherosclerotic heart disease of native coronary artery without angina pectoris: Secondary | ICD-10-CM | POA: Diagnosis not present

## 2024-02-27 DIAGNOSIS — I252 Old myocardial infarction: Secondary | ICD-10-CM | POA: Diagnosis not present

## 2024-02-28 DIAGNOSIS — I2510 Atherosclerotic heart disease of native coronary artery without angina pectoris: Secondary | ICD-10-CM | POA: Diagnosis not present

## 2024-02-28 DIAGNOSIS — G4733 Obstructive sleep apnea (adult) (pediatric): Secondary | ICD-10-CM | POA: Diagnosis not present

## 2024-02-28 DIAGNOSIS — E119 Type 2 diabetes mellitus without complications: Secondary | ICD-10-CM | POA: Diagnosis not present

## 2024-02-28 DIAGNOSIS — I252 Old myocardial infarction: Secondary | ICD-10-CM | POA: Diagnosis not present

## 2024-03-01 DIAGNOSIS — I252 Old myocardial infarction: Secondary | ICD-10-CM | POA: Diagnosis not present

## 2024-03-01 DIAGNOSIS — I2510 Atherosclerotic heart disease of native coronary artery without angina pectoris: Secondary | ICD-10-CM | POA: Diagnosis not present

## 2024-03-01 DIAGNOSIS — E119 Type 2 diabetes mellitus without complications: Secondary | ICD-10-CM | POA: Diagnosis not present

## 2024-03-04 ENCOUNTER — Ambulatory Visit (HOSPITAL_BASED_OUTPATIENT_CLINIC_OR_DEPARTMENT_OTHER)

## 2024-03-04 DIAGNOSIS — R0602 Shortness of breath: Secondary | ICD-10-CM | POA: Diagnosis not present

## 2024-03-05 ENCOUNTER — Other Ambulatory Visit (HOSPITAL_BASED_OUTPATIENT_CLINIC_OR_DEPARTMENT_OTHER): Payer: Self-pay

## 2024-03-05 DIAGNOSIS — C44729 Squamous cell carcinoma of skin of left lower limb, including hip: Secondary | ICD-10-CM | POA: Diagnosis not present

## 2024-03-05 LAB — ECHOCARDIOGRAM COMPLETE
Area-P 1/2: 3.6 cm2
S' Lateral: 2.18 cm

## 2024-03-07 ENCOUNTER — Ambulatory Visit (HOSPITAL_BASED_OUTPATIENT_CLINIC_OR_DEPARTMENT_OTHER): Payer: Self-pay | Admitting: Cardiology

## 2024-03-13 ENCOUNTER — Encounter (INDEPENDENT_AMBULATORY_CARE_PROVIDER_SITE_OTHER): Payer: Self-pay | Admitting: Family Medicine

## 2024-03-13 ENCOUNTER — Ambulatory Visit (INDEPENDENT_AMBULATORY_CARE_PROVIDER_SITE_OTHER): Admitting: Family Medicine

## 2024-03-13 VITALS — BP 105/70 | HR 81 | Temp 98.3°F | Ht 63.0 in | Wt 167.0 lb

## 2024-03-13 DIAGNOSIS — Z7985 Long-term (current) use of injectable non-insulin antidiabetic drugs: Secondary | ICD-10-CM | POA: Diagnosis not present

## 2024-03-13 DIAGNOSIS — Z6829 Body mass index (BMI) 29.0-29.9, adult: Secondary | ICD-10-CM | POA: Diagnosis not present

## 2024-03-13 DIAGNOSIS — E1169 Type 2 diabetes mellitus with other specified complication: Secondary | ICD-10-CM | POA: Diagnosis not present

## 2024-03-13 DIAGNOSIS — Z6830 Body mass index (BMI) 30.0-30.9, adult: Secondary | ICD-10-CM

## 2024-03-13 DIAGNOSIS — I25118 Atherosclerotic heart disease of native coronary artery with other forms of angina pectoris: Secondary | ICD-10-CM | POA: Diagnosis not present

## 2024-03-13 DIAGNOSIS — E669 Obesity, unspecified: Secondary | ICD-10-CM | POA: Diagnosis not present

## 2024-03-13 DIAGNOSIS — E66813 Obesity, class 3: Secondary | ICD-10-CM

## 2024-03-13 NOTE — Progress Notes (Signed)
 Pamela Love, D.O.  ABFM, ABOM Specializing in Clinical Bariatric Medicine  Office located at: [address] [place], [place]  [address]     Assessment and Plan:    FOR THE DISEASE OF OBESITY:  Obesity - Starting BMI 40.22 date 03/17/20 BMI 30.0-30.9,adult -- Current BMI 29.59 Assessment & Plan: Since last office visit on 02/21/24 patient's muscle mass has decreased by 1.6 lbs. Fat mass has increased by 0.6 lbs. Total body water has decreased by 0.6 lbs.  Body fat % has increased by 0.6 %. Counseling done on how various foods will affect these numbers and how to maximize success  Total lbs lost to date: 60 lbs Total weight loss percentage to date: -26.43 %   Recommended Dietary Goals Pamela Love is currently in the action stage of change. As such, her goal is to continue weight management plan.  She has agreed to: continue current plan   Behavioral Intervention We discussed the following today: increasing lean protein intake to established goals, decreasing simple carbohydrates , increasing lower glycemic fruits, increasing fiber rich foods, increasing water intake , and keeping healthy foods at home, break meal s up throughout the day to ensure adequate protein intake per day.   Additional resources provided today: None  Evidence-based interventions for health behavior change were utilized today including the discussion of self monitoring techniques, problem-solving barriers and SMART goal setting techniques.   Regarding patient's less desirable eating habits and patterns, we employed the technique of small changes.   Pt will specifically work on: n/a   Recommended Physical Activity Goals Pamela Love has been advised to work up to 300-450 minutes of moderate intensity aerobic activity a week and strengthening exercises 2-3 times per week for cardiovascular health, weight loss maintenance and preservation of muscle mass.   She has agreed to: Continue current level of physical  activity , Increase physical activity in their day and reduce sedentary time (increase NEAT)., and gradually increase exercise as able.    Pharmacotherapy We both agreed to: Continue with current nutritional and behavioral strategies continue with meds that aid in wt loss (Mounjaro )    ASSOCIATED CONDITIONS ADDRESSED TODAY:  Coronary artery disease involving native coronary artery of native heart with other form of angina pectoris [facility]) Assessment & Plan: Has hx of MIs. Presented to the ED c/o SOB and nausea on 01/15/24. Determined to be an acute NSTEMI. Cardiac cath done while hospitalized on 01/15/24; Normal overall LV function with inferior HK. Mildly elevated LVEDP 18 mm Hg. Pt f/u with Pamela Love on 7/1 and had ECHO done 03/04/24: LVEF 60-65% with normal function and no regional wall motion abnormalities. She has since started cardiac rehab. ED notes, inpatient consult notes, inpatient hospitalist notes, as well as recent outpatient cardiology notes reviewed. All relevant cardiac imaging and all applicable labs and tests reviewed. Pt reports having a cardiac cath in 01/2021 and 12/2019. Had stent placed 12/2019. No interventions done in 01/2021.   Currently on Aspirin  81 mg once daily, Plavix  75 mg once daily, Ranexa  1,000 mg BID, and Rosuvastatin  40 mg once daily. Good compliance and tolerance. No adverse SE. Pt is asx today with no acute concerns.   Continue with current med regimen as prescribed. Continue working on [employer]; reduce salt intake, decrease simple carbs, and increase protein intake. Encouraged pt to continue exercise as able and as recommended by cardiology. Go on [facility] website for further education on post-MI recommendations. Follow up with PCP/specialists as instructed by them.  Will continue monitoring alongside specialists/PCP.     Type 2 diabetes mellitus with obesity [facility]) Assessment & Plan: Lab Results  Component Value Date   HGBA1C 5.6 01/10/2024   HGBA1C  5.5 09/14/2023    Reviewed last obtained labs: A1c is at goal. Currently on Mounjaro  12.5 mg once weekly. Monitoring her BGs at home; per her log they are overall WNL. Has 2 fasting readings of 146 and 100. No acute concerns reported. Pt is established with Dr. Trixie of endocrinology and has an upcoming f/u.   Continue with current med regimen as prescribed. Work on decreasing simple carbs and added sugars, increase intake of protein/fiber/whole food, and increase water intake. Drink at least 1/2 her body weight in ounces of water per day; more when exercising. Will continue monitoring alongside specialists/PCP.     Follow up:   Return in about 3 weeks (around 04/03/2024) for f/u on 04/02/2024 at 2:00 PM with Pamela Dalton, NP. Make additional appt 3 weeks after with Dr. [name]. She was informed of the importance of frequent follow up visits to maximize her success with intensive lifestyle modifications for her multiple health conditions.  Subjective:   Chief complaint: Obesity Pamela Love is here to discuss her progress with her obesity treatment plan. She is on the Category 1 Plan and states she is following her eating plan approximately 75% of the time. She states she is walking and biking.   Interval History:  Pamela Love is here for a follow up office visit. Since last OV on 7/23, she is down 1 lb. Pt reports a decreased appetite since her recent hospitalization. Has not been journaling. Has not been meeting protein goal daily, despite her best efforts. Has tried to supplement with [employer] skim milk. Has not tried eating multiple smaller meals. For the first time in a very long time, she went out with her friends and reports eating off plan foods.    Pharmacotherapy that aid with weight loss: She is currently taking Mounjaro  12.5 mg once weekly.    Review of Systems:  Pertinent positives were addressed with patient today.  Reviewed by clinician on day of visit: allergies, medications,  problem list, medical history, surgical history, family history, social history, and previous encounter notes.  Weight Summary and Biometrics   Weight Lost Since Last Visit: 1lb  Weight Gained Since Last Visit: 0    Vitals Temp: 98.3 F (36.8 C) BP: 105/70 Pulse Rate: 81 SpO2: 100 %   Anthropometric Measurements Height: 5' 3 (1.6 m) Weight: 167 lb (75.8 kg) BMI (Calculated): 29.59 Weight at Last Visit: 168lb Weight Lost Since Last Visit: 1lb Weight Gained Since Last Visit: 0 Starting Weight: 227lb Total Weight Loss (lbs): 60 lb (27.2 kg) Peak Weight: 250lb   Body Composition  Body Fat %: 44.1 % Fat Mass (lbs): 74 lbs Muscle Mass (lbs): 89 lbs Total Body Water (lbs): 74.6 lbs Visceral Fat Rating : 13   Other Clinical Data Fasting: no Labs: no Today's Visit #: 81 Starting Date: 03/17/20    Objective:   PHYSICAL EXAM: Blood pressure 105/70, pulse 81, temperature 98.3 F (36.8 C), height 5' 3 (1.6 m), weight 167 lb (75.8 kg), SpO2 100%. Body mass index is 29.58 kg/m.  General: she is overweight, cooperative and in no acute distress. PSYCH: Has normal mood, affect and thought process.   HEENT: EOMI, sclerae are anicteric. Lungs: Normal breathing effort, no conversational dyspnea. Extremities: Moves * 4 Neurologic: A and O * 3, good insight  DIAGNOSTIC DATA REVIEWED: BMET    Component Value Date/Time   NA 140 01/16/2024 0600   NA 141 01/10/2024 1523   K 3.9 01/16/2024 0600   CL 109 01/16/2024 0600   CO2 22 01/16/2024 0600   GLUCOSE 110 (H) 01/16/2024 0600   BUN 26 (H) 01/16/2024 0600   BUN 27 01/10/2024 1523   CREATININE 0.92 01/16/2024 0600   CALCIUM  9.3 01/16/2024 0600   GFRNONAA >60 01/16/2024 0600   GFRAA 54 (L) 05/12/2020 1557   Lab Results  Component Value Date   HGBA1C 5.6 01/10/2024   HGBA1C 6 1 02/09/2015   No results found for: INSULIN  Lab Results  Component Value Date   TSH 2.490 01/10/2024   CBC    Component Value  Date/Time   WBC 7.0 01/16/2024 0441   RBC 4.03 01/16/2024 0441   HGB 13.0 01/16/2024 0441   HGB 13.8 01/26/2021 1427   HCT 38.9 01/16/2024 0441   HCT 41.6 01/26/2021 1427   PLT 178 01/16/2024 0441   PLT 229 01/26/2021 1427   MCV 96.5 01/16/2024 0441   MCV 92 01/26/2021 1427   MCH 32.3 01/16/2024 0441   MCHC 33.4 01/16/2024 0441   RDW 11.9 01/16/2024 0441   RDW 13.2 01/26/2021 1427   Iron Studies No results found for: IRON, TIBC, FERRITIN, IRONPCTSAT Lipid Panel     Component Value Date/Time   CHOL 130 01/16/2024 0600   CHOL 133 12/19/2022 1435   TRIG 68 01/16/2024 0600   HDL 52 01/16/2024 0600   HDL 46 12/19/2022 1435   CHOLHDL 2.5 01/16/2024 0600   VLDL 14 01/16/2024 0600   LDLCALC 64 01/16/2024 0600   LDLCALC 67 12/19/2022 1435   Hepatic Function Panel     Component Value Date/Time   PROT 5.7 (L) 01/16/2024 0600   PROT 6.5 01/10/2024 1523   ALBUMIN 3.4 (L) 01/16/2024 0600   ALBUMIN 4.5 01/10/2024 1523   AST 22 01/16/2024 0600   ALT 27 01/16/2024 0600   ALKPHOS 71 01/16/2024 0600   BILITOT 0.6 01/16/2024 0600   BILITOT 0.5 01/10/2024 1523      Component Value Date/Time   TSH 2.490 01/10/2024 1523   Nutritional Lab Results  Component Value Date   VD25OH 54.7 01/10/2024   VD25OH 61.9 06/19/2023   VD25OH 77.1 12/19/2022    Attestations:   I, Pamela Love, acting as a Stage manager for Pamela Jenkins, DO., have compiled all relevant documentation for today's office visit on behalf of Pamela Jenkins, DO, while in the presence of Pamela & [employer], DO.  I have reviewed the above documentation for accuracy and completeness, and I agree with the above. Pamela JINNY Love, D.O.  The 21st Century Cures Act was signed into law in 2016 which includes the topic of electronic health records.  This provides immediate access to information in MyChart.  This includes consultation notes, operative notes, office notes, lab results and pathology reports.  If you  have any questions about what you read please let us  know at your next visit so we can discuss your concerns and take corrective action if need be.  We are right here with you.

## 2024-03-14 ENCOUNTER — Encounter: Payer: Self-pay | Admitting: Internal Medicine

## 2024-03-14 ENCOUNTER — Ambulatory Visit: Payer: Medicare PPO | Admitting: Internal Medicine

## 2024-03-14 VITALS — BP 104/60 | HR 78 | Ht 63.0 in | Wt 170.0 lb

## 2024-03-14 DIAGNOSIS — N1831 Chronic kidney disease, stage 3a: Secondary | ICD-10-CM

## 2024-03-14 DIAGNOSIS — E782 Mixed hyperlipidemia: Secondary | ICD-10-CM

## 2024-03-14 DIAGNOSIS — Z7985 Long-term (current) use of injectable non-insulin antidiabetic drugs: Secondary | ICD-10-CM

## 2024-03-14 DIAGNOSIS — E1122 Type 2 diabetes mellitus with diabetic chronic kidney disease: Secondary | ICD-10-CM

## 2024-03-14 LAB — POCT GLYCOSYLATED HEMOGLOBIN (HGB A1C): Hemoglobin A1C: 5.2 % (ref 4.0–5.6)

## 2024-03-14 NOTE — Progress Notes (Signed)
 Patient ID: Pamela Love, female   DOB: [birth year], 73 y.o.   MRN: [id number]  HPI: Pamela Love is a 73 y.o.-year-old female, returning for follow-up for DM2, dx in 2012, insulin -dependent, uncontrolled, with complications (CAD - s/p 2 stents, stage 3 CKD). Pt. previously saw Dr. Kassie, but last visit with me 6 months ago.  Interim history: No increased urination, blurry vision, nausea, chest pain. She lost ~75 lbs since she started to see the [facility] approximately 3.5 years ago. Since last visit, she had an NSTEMI in 12/2023 >> had a cath and 2 stents were placed.  She feels well now, without chest pain.  She is on Plavix . She feels she has decreased appetite and decreased craving for sweets. She had a squamous cell CA on the back of the L lower leg.  Reviewed HbA1c: Lab Results  Component Value Date   HGBA1C 5.6 01/10/2024   HGBA1C 5.5 09/14/2023   HGBA1C 5.5 06/19/2023   HGBA1C 5.5 03/14/2023   HGBA1C 5.7 (H) 12/19/2022   HGBA1C 5.5 08/30/2022   HGBA1C 6.0 (H) 06/06/2022   HGBA1C 6.1 (A) 02/25/2022   HGBA1C 5.8 (A) 10/14/2021   HGBA1C 6.1 (A) 07/12/2021   Pt is on a regimen of: - Mounjaro  7.5 >> 10 >> 12.5 >> 10 >> 12.5 mg weekly - [facility] She previously tried Victoza  and Ozempic . She tried Trulicity and Ozempic  >> nausea She tried Jardiance  >> vaginal yeast infections She tried repaglinide  >> diarrhea She was on Toujeo  initially 16 units, but came off in 04/2023. Reviewing Dr. Laymond last note, she declined multiple daily injections in the past.  Pt checks her sugars 1x a day and they are: - am: 101-150, 154, 169 >> 106-141, 151 >> 124-146 >> 103-146 - 2h after b'fast: n/c - before lunch: 110 >> 88, 90, 128 >> 98-115 >> 91-122 >> n/c - 2h after lunch: n/c - before dinner: 126 >> 109, 120 >> 75-103 >> 122, 143 - 2h after dinner: n/c >> 93 >> n/c >> 130>> n/c - bedtime: 87-114 >> 74-119 >> 84-119  >> 81- 100 >> 74, 93-124 - nighttime: n/c Lowest sugar was 87 >> 74 >> 75>> 79>> 74; she has hypoglycemia awareness at 70.  Highest sugar was 180 >> 169 >> 151 >> 146 >> 174  Glucometer:Accu Chek  - + CKD, last BUN/creatinine:  Lab Results  Component Value Date   BUN 26 (H) 01/16/2024   BUN 29 (H) 01/15/2024   CREATININE 0.92 01/16/2024   CREATININE 0.85 01/15/2024   Lab Results  Component Value Date   MICRALBCREAT 10 04/21/2023  She is not on ACE inhibitor/ARB.  -+ HL; last set of lipids: Lab Results  Component Value Date   CHOL 130 01/16/2024   HDL 52 01/16/2024   LDLCALC 64 01/16/2024   TRIG 68 01/16/2024   CHOLHDL 2.5 01/16/2024  She is on Crestor  40 mg daily.  - last eye exam was on 11/01/2023. No DR reportedly. Dr. Charmayne.  - no numbness and tingling in her feet.  Last foot exam 09/2023.  She also has a history of GERD, OSA, OA, osteoporosis, obesity.  She lost 28-30 lbs since 2021 - after [facility].  She adapted well to the diet, despite the fact that she describes having a sweet tooth.  ROS: + see HPI  Past Medical History:  Diagnosis Date   Abnormal nuclear stress test 01/02/2020   Arthritis  Bilateral leg edema 01/02/2020   Bursitis    hips   C. difficile colitis 12/2017   CAD (coronary artery disease) 01/06/2020   CKD (chronic kidney disease), stage III (HCC)    Complication of anesthesia    spinal did not work with second c section   Diabetes mellitus (HCC) 11/19/2015   Diabetes mellitus, type 2 (HCC)    diet controlled - has Rx for Glymipride but has not started taking yet   Dyslipidemia 10/21/2014   GERD (gastroesophageal reflux disease)    History of nonmelanoma skin cancer    History of skin cancer    Hypercholesterolemia 12/11/2019   Hyperlipidemia    Hypertension    Hypertensive disorder 10/21/2014   Lower extremity edema    Mixed hyperlipidemia 01/02/2020   OAB (overactive bladder)    Obesity (BMI 30-39.9)  05/12/2020   OSA (obstructive sleep apnea) 05/12/2020   Osteoarthritis of right knee 05/15/2015   Osteoarthrosis, unspecified whether generalized or localized, involving lower leg 10/21/2014   Osteoporosis    Over weight    Primary osteoarthritis of left knee 12/18/2014   S/P knee replacement 12/18/2014   S/P total knee replacement using cement 05/15/2015   Sleep apnea    Tachycardia 12/11/2019   Past Surgical History:  Procedure Laterality Date   ABDOMINAL HYSTERECTOMY  2005   CATARACT EXTRACTION     CESAREAN SECTION     x2   colonscopy      CORONARY PRESSURE/FFR STUDY N/A 01/06/2020   Procedure: INTRAVASCULAR PRESSURE WIRE/FFR STUDY;  Surgeon: Court Dorn PARAS, MD;  Location: [facility] INVASIVE CV LAB;  Service: Cardiovascular;  Laterality: N/A;   CORONARY PRESSURE/FFR WITH 3D MAPPING N/A 01/16/2024   Procedure: Coronary Pressure/FFR w/3D Mapping;  Surgeon: Swaziland, Peter M, MD;  Location: [facility] INVASIVE CV LAB;  Service: Cardiovascular;  Laterality: N/A;   CORONARY STENT INTERVENTION N/A 01/06/2020   Procedure: CORONARY STENT INTERVENTION;  Surgeon: Court Dorn PARAS, MD;  Location: [facility] INVASIVE CV LAB;  Service: Cardiovascular;  Laterality: N/A;   EYE SURGERY     bilat cataract surgery 2015   LEFT HEART CATH AND CORONARY ANGIOGRAPHY N/A 01/06/2020   Procedure: LEFT HEART CATH AND CORONARY ANGIOGRAPHY;  Surgeon: Court Dorn PARAS, MD;  Location: [facility] INVAS[facility] CV [facility];  Service: Cardiovascular;  Laterality: N/A;   LEFT HEART CATH AND CORONARY ANGIOGRAPHY N/A 01/29/2021   Procedure: LEFT HEART CATH AND CORONARY ANGIOGRAPHY;  Surgeon: Dann Candyce RAMAN, MD;  Location: [facility] INVASIVE CV LAB;  Service: Cardiovascular;  Laterality: N/A;   LEFT HEART CATH AND CORONARY ANGIOGRAPHY N/A 01/16/2024   Procedure: LEFT HEART CATH AND CORONARY ANGIOGRAPHY;  Surgeon: Swaziland, Peter M, MD;  Location: [facility] INVASIVE CV LAB;  Service: Cardiovascular;  Laterality: N/A;   TOTAL KNEE ARTHROPLASTY Left 12/18/2014   Procedure: TOTAL  LEFT KNEE ARTHROPLASTY;  Surgeon: Lamar Collet, MD;  Location: [facility];  Service: Orthopedics;  Laterality: Left;   TOTAL KNEE ARTHROPLASTY Right 05/15/2015   Procedure: RIGHT TOTAL KNEE ARTHROPLASTY;  Surgeon: Lamar Collet, MD;  Location: [facility];  Service: Orthopedics;  Laterality: Right;   UTERINE FIBROID SURGERY     Social History   Socioeconomic History   Marital status: Married    Spouse name: Not on file   Number of children: Not on file   Years of education: Not on file   Highest education level: Not on file  Occupational History   Occupation: retired  Tobacco Use   Smoking status: Never   Smokeless tobacco: Never  Vaping Use   Vaping status: Never Used  Substance and Sexual Activity   Alcohol use: Yes    Comment: rare   Drug use: No   Sexual activity: Not Currently    Partners: Male  Other Topics Concern   Not on file  Social History Narrative   Not on file   Social Drivers of Health   Financial Resource Strain: Not on file  Food Insecurity: Not on file  Transportation Needs: Not on file  Physical Activity: Not on file  Stress: Not on file  Social Connections: Not on file  Intimate Partner Violence: Not on file   Current Outpatient Medications on File Prior to Visit  Medication Sig Dispense Refill   Accu-Chek Softclix Lancets lancets USE TO CHECK BLOOD SUGAR TWICE DAILY 200 each 3   acetaminophen  (TYLENOL ) 650 MG CR tablet Take 1,300 mg by mouth at bedtime.     aspirin  EC 81 MG tablet Take 1 tablet (81 mg total) by mouth in the morning.     Blood Glucose Monitoring Suppl (ACCU-CHEK GUIDE ME) w/Device KIT 1 each by Does not apply route 2 (two) times daily. E11.9 1 kit 0   carvedilol  (COREG ) 3.125 MG tablet Take 1 tablet (3.125 mg total) by mouth 2 (two) times daily with a meal. 180 tablet 3   cephALEXin (KEFLEX) 500 MG capsule Take 500 mg by mouth 4 (four) times daily.     Cholecalciferol (VITAMIN D ) 50 MCG (2000 UT) CAPS Take 1 capsule (2,000 Units total)  by mouth every morning.     clopidogrel  (PLAVIX ) 75 MG tablet TAKE 1 TABLET BY MOUTH EVERY DAY WITH BREAKFAST 90 tablet 2   Coenzyme Q10 (COQ10) 200 MG CAPS Take 200 mg by mouth in the morning.     DULoxetine  (CYMBALTA ) 20 MG capsule Take 40 mg by mouth at bedtime.      glucose blood (ACCU-CHEK GUIDE) test strip Use to check BS 2x a day 200 each 12   levocetirizine (XYZAL ) 5 MG tablet Take 1 tablet (5 mg total) by mouth every evening. 30 tablet 0   Multiple Vitamin (MULTIVITAMIN WITH MINERALS) TABS tablet Take 1 tablet by mouth every morning.      nitroGLYCERIN  (NITROSTAT ) 0.4 MG SL tablet PLACE 1 TABLET UNDER THE TONGUE EVERY 5 MINUTES X 3 DOSES AS NEEDED FOR CHEST PAIN. 75 tablet 3   Omega-3 Fatty Acids (RA FISH OIL) 1400 MG CPDR Take 1,400 mg by mouth 2 (two) times daily.     pantoprazole  (PROTONIX ) 40 MG tablet Take 40 mg by mouth daily.     ranolazine  (RANEXA ) 1000 MG SR tablet TAKE 1 TABLET BY MOUTH TWICE A DAY 180 tablet 2   rosuvastatin  (CRESTOR ) 40 MG tablet TAKE 1 TABLET (40 MG TOTAL) BY MOUTH IN THE MORNING 90 tablet 3   saccharomyces boulardii (FLORASTOR) 250 MG capsule Take 250 mg by mouth in the morning.     tirzepatide  (MOUNJARO ) 12.5 MG/0.5ML Pen Inject 12.5 mg into the skin once a week. 6 mL 0   trospium (SANCTURA) 20 MG tablet Take 20 mg by mouth 2 (two) times daily.  3   vitamin C (ASCORBIC ACID) 250 MG tablet Take 250 mg by mouth in the morning.     No current facility-administered medications on file prior to visit.   Allergies  Allergen Reactions   Amoxicillin-Pot Clavulanate     Other reaction(s): vomiting   Aspirin      Other reaction(s): reflux and nervousness   Colchicine  Stomach cramps Other reaction(s): severe GI upset   Diclofenac     Oral causes stomach cramps   Dulaglutide Diarrhea and Nausea And Vomiting    Other reaction(s): diarrhea   Empagliflozin      Other reaction(s): yeast infection   Ezetimibe     Muscle pain Other reaction(s): muscle  aches   Imdur  [Isosorbide  Nitrate] Other (See Comments)    hypotensive   Isosorbide  Other (See Comments)    Causes Hypotension   Metformin      Other reaction(s): stomach upset   Repaglinide      Other reaction(s): swelling and GI upset   Sitagliptin  Phos-Metformin  Hcl Nausea And Vomiting    Lightheaded  Other reaction(s): GI upset   Sulfamethoxazole-Trimethoprim Itching    Other reaction(s): itching   Imipramine Rash   Tape Rash    Paper tape ok to use    Family History  Problem Relation Age of Onset   Heart disease Mother    Hypertension Mother    Hyperlipidemia Mother    Cancer Mother    Heart attack Father    Hypertension Father    Hyperlipidemia Father    Heart disease Father    Sudden death Father    Sleep apnea Father    Heart attack Sister    Heart attack Paternal Uncle    Breast cancer Maternal Grandmother 50   Diabetes Maternal Grandfather    Heart attack Maternal Grandfather    Congenital heart disease Son    BRCA 1/2 Neg Hx    PE: BP 104/60 (BP Location: Left Arm, Patient Position: Sitting, Cuff Size: Normal)   Pulse 78   Ht 5' 3 (1.6 m)   Wt 170 lb (77.1 kg)   SpO2 98%   BMI 30.11 kg/m  Wt Readings from Last 20 Encounters:  03/14/24 170 lb (77.1 kg)  03/13/24 167 lb (75.8 kg)  02/21/24 168 lb (76.2 kg)  01/30/24 174 lb 1.6 oz (79 kg)  01/15/24 172 lb (78 kg)  01/10/24 171 lb (77.6 kg)  12/21/23 172 lb (78 kg)  12/05/23 175 lb 6.4 oz (79.6 kg)  11/14/23 171 lb (77.6 kg)  10/25/23 170 lb (77.1 kg)  09/25/23 171 lb (77.6 kg)  09/14/23 174 lb 3.2 oz (79 kg)  09/12/23 171 lb (77.6 kg)  08/28/23 169 lb (76.7 kg)  08/14/23 170 lb (77.1 kg)  07/18/23 167 lb (75.8 kg)  07/18/23 170 lb (77.1 kg)  07/03/23 168 lb (76.2 kg)  06/19/23 168 lb (76.2 kg)  06/05/23 167 lb (75.8 kg)   Constitutional: overweight, in NAD Eyes: no exophthalmos ENT: no thyromegaly, no cervical lymphadenopathy Cardiovascular: RRR, No MRG Respiratory: CTA  B Musculoskeletal: no deformities Skin: no rashes Neurological: no tremor with outstretched hands  ASSESSMENT: 1. DM2, non-insulin -dependent, uncontrolled, without long-term complications - CAD - s/p 2 stents - stage 3 CKD  Component     Latest Ref Rng 03/14/2023  Hemoglobin A1C     4.0 - 5.6 % 5.5   Glucose     65 - 99 mg/dL 92   C-Peptide     9.19 - 3.85 ng/mL 5.79 (H)   IA-2 Antibody     <5.4 U/mL <5.4   ZNT8 Antibodies     <15 U/mL <10   Glutamic Acid Decarb Ab     <5 IU/mL <5   No signs of insulin  deficiency or positive pancreatic autoimmunity.    2. HL  3.  Obesity class I  PLAN:  1. Patient with well-controlled  type 2 diabetes, only on weekly GLP-1/GIP receptor agonist, with good control.  At last visit HbA1c was 5.5%, stable, and she had another HbA1c obtained 2 months ago which was 5.6%, still excellent.  She continues to see Dr. Verdon in the weight management clinic. - At last visit sugars were at goal or slightly above target in the morning but very well-controlled later in the day.  We did not change her regimen at that time. -At today's visit, her sugars remain mostly at goal but with occasional hyperglycemic spikes particularly in the morning.  These happen after later or larger meal the day before.  However, in the light of her even lower HbA1c, we discussed about potentially reducing the Mounjaro  dose.  This is usually adjusted by the weight management specialist but she tells me that the recommended the same thing, but the patient just picked up a 41-month supply of the higher dose.  In this case, I recommended to try to take the Mounjaro  approximately every 10 days until she runs out.  However, she will check with the weight management specialist. - I suggested to:  Patient Instructions  Please continue: - Mounjaro  12.5 mg weekly (try to decrease to every 10 days or decrease the dose to 10 mg weekly)   Please return in 6 months with your sugar log.   - we  checked her HbA1c: 5.2% (even lower than before) - advised to check sugars at different times of the day - 1x a day, rotating check times - advised for yearly eye exams >> she is UTD - we will check an ACR today - return to clinic in 6 months  2. HL - Latest lipid panel from 12/2023 was at goal with the exception of a slightly elevated LDL, above our target of less than 55: Lab Results  Component Value Date   CHOL 130 01/16/2024   HDL 52 01/16/2024   LDLCALC 64 01/16/2024   TRIG 68 01/16/2024   CHOLHDL 2.5 01/16/2024  -She is on Crestor  40 mg daily without side effects  3.  Obesity class I - She continues on Mounjaro  which should also help with weight loss -At last visit she was wondering about goal weight and I recommended a BMI of 25 or lower. -Before the last 4 visits combined, she lost approximately 60 pounds, with a stable weight at last visit.  However, since then, she was able to lose approximately 5 more pounds. -She does have decreased appetite and we discussed about possibly reducing the Mounjaro  dose (see above) - Continues to go to the weight management clinic  Lela Fendt, MD PhD [facility]

## 2024-03-14 NOTE — Addendum Note (Signed)
 Addended by: OCTAVIO DIETRICH CROME on: 03/14/2024 02:24 PM   Modules accepted: Orders

## 2024-03-14 NOTE — Patient Instructions (Addendum)
 Please continue: - Mounjaro  12.5 mg weekly (try to decrease to every 10 days or decrease the dose to 10 mg weekly)   Please return in 6 months with your sugar log.

## 2024-03-15 ENCOUNTER — Ambulatory Visit: Payer: Self-pay | Admitting: Internal Medicine

## 2024-03-15 DIAGNOSIS — E119 Type 2 diabetes mellitus without complications: Secondary | ICD-10-CM | POA: Diagnosis not present

## 2024-03-15 DIAGNOSIS — I252 Old myocardial infarction: Secondary | ICD-10-CM | POA: Diagnosis not present

## 2024-03-15 DIAGNOSIS — I2510 Atherosclerotic heart disease of native coronary artery without angina pectoris: Secondary | ICD-10-CM | POA: Diagnosis not present

## 2024-03-15 LAB — MICROALBUMIN / CREATININE URINE RATIO
Creatinine, Urine: 52 mg/dL (ref 20–275)
Microalb Creat Ratio: 21 mg/g{creat} (ref <30)
Microalb, Ur: 1.1 mg/dL

## 2024-03-18 DIAGNOSIS — E119 Type 2 diabetes mellitus without complications: Secondary | ICD-10-CM | POA: Diagnosis not present

## 2024-03-18 DIAGNOSIS — I252 Old myocardial infarction: Secondary | ICD-10-CM | POA: Diagnosis not present

## 2024-03-18 DIAGNOSIS — I2510 Atherosclerotic heart disease of native coronary artery without angina pectoris: Secondary | ICD-10-CM | POA: Diagnosis not present

## 2024-03-20 DIAGNOSIS — I2510 Atherosclerotic heart disease of native coronary artery without angina pectoris: Secondary | ICD-10-CM | POA: Diagnosis not present

## 2024-03-20 DIAGNOSIS — E119 Type 2 diabetes mellitus without complications: Secondary | ICD-10-CM | POA: Diagnosis not present

## 2024-03-20 DIAGNOSIS — I252 Old myocardial infarction: Secondary | ICD-10-CM | POA: Diagnosis not present

## 2024-03-22 DIAGNOSIS — E119 Type 2 diabetes mellitus without complications: Secondary | ICD-10-CM | POA: Diagnosis not present

## 2024-03-22 DIAGNOSIS — I252 Old myocardial infarction: Secondary | ICD-10-CM | POA: Diagnosis not present

## 2024-03-22 DIAGNOSIS — I2510 Atherosclerotic heart disease of native coronary artery without angina pectoris: Secondary | ICD-10-CM | POA: Diagnosis not present

## 2024-03-25 DIAGNOSIS — I2510 Atherosclerotic heart disease of native coronary artery without angina pectoris: Secondary | ICD-10-CM | POA: Diagnosis not present

## 2024-03-25 DIAGNOSIS — E119 Type 2 diabetes mellitus without complications: Secondary | ICD-10-CM | POA: Diagnosis not present

## 2024-03-25 DIAGNOSIS — I252 Old myocardial infarction: Secondary | ICD-10-CM | POA: Diagnosis not present

## 2024-03-27 DIAGNOSIS — E119 Type 2 diabetes mellitus without complications: Secondary | ICD-10-CM | POA: Diagnosis not present

## 2024-03-27 DIAGNOSIS — I252 Old myocardial infarction: Secondary | ICD-10-CM | POA: Diagnosis not present

## 2024-03-27 DIAGNOSIS — I2510 Atherosclerotic heart disease of native coronary artery without angina pectoris: Secondary | ICD-10-CM | POA: Diagnosis not present

## 2024-03-29 DIAGNOSIS — E119 Type 2 diabetes mellitus without complications: Secondary | ICD-10-CM | POA: Diagnosis not present

## 2024-03-29 DIAGNOSIS — I252 Old myocardial infarction: Secondary | ICD-10-CM | POA: Diagnosis not present

## 2024-03-29 DIAGNOSIS — I2510 Atherosclerotic heart disease of native coronary artery without angina pectoris: Secondary | ICD-10-CM | POA: Diagnosis not present

## 2024-04-02 ENCOUNTER — Encounter (INDEPENDENT_AMBULATORY_CARE_PROVIDER_SITE_OTHER): Payer: Self-pay | Admitting: Adult Health

## 2024-04-02 ENCOUNTER — Ambulatory Visit (INDEPENDENT_AMBULATORY_CARE_PROVIDER_SITE_OTHER): Admitting: Adult Health

## 2024-04-02 VITALS — BP 125/74 | HR 76 | Temp 98.9°F | Ht 63.0 in | Wt 167.0 lb

## 2024-04-02 DIAGNOSIS — E669 Obesity, unspecified: Secondary | ICD-10-CM

## 2024-04-02 DIAGNOSIS — Z0000 Encounter for general adult medical examination without abnormal findings: Secondary | ICD-10-CM

## 2024-04-02 DIAGNOSIS — Z6830 Body mass index (BMI) 30.0-30.9, adult: Secondary | ICD-10-CM

## 2024-04-02 DIAGNOSIS — E1169 Type 2 diabetes mellitus with other specified complication: Secondary | ICD-10-CM | POA: Diagnosis not present

## 2024-04-02 DIAGNOSIS — E559 Vitamin D deficiency, unspecified: Secondary | ICD-10-CM

## 2024-04-02 DIAGNOSIS — Z6829 Body mass index (BMI) 29.0-29.9, adult: Secondary | ICD-10-CM | POA: Diagnosis not present

## 2024-04-02 DIAGNOSIS — Z7985 Long-term (current) use of injectable non-insulin antidiabetic drugs: Secondary | ICD-10-CM | POA: Diagnosis not present

## 2024-04-02 NOTE — Progress Notes (Signed)
 WEIGHT SUMMARY AND BIOMETRICS  Vitals Temp: 98.9 F (37.2 C) BP: 125/74 Pulse Rate: 76 SpO2: 98 %   Anthropometric Measurements Height: 5' 3 (1.6 m) Weight: 167 lb (75.8 kg) BMI (Calculated): 29.59 Weight at Last Visit: 167lb Weight Lost Since Last Visit: 0lb Weight Gained Since Last Visit: 0lb Starting Weight: 227lb Total Weight Loss (lbs): 60 lb (27.2 kg) Peak Weight: 250lb   Body Composition  Body Fat %: 44 % Fat Mass (lbs): 73.8 lbs Muscle Mass (lbs): 89.2 lbs Total Body Water (lbs): 77 lbs Visceral Fat Rating : 13   Other Clinical Data Fasting: No Labs: no Today's Visit #: 87 Starting Date: 03/17/20    Chief Complaint:   OBESITY Pamela Love is here to discuss her progress with her obesity treatment plan.  She is on the the Category 1 Plan and states she is following her eating plan approximately 70 % of the time. She states she is exercising Cardiac Rehab 45 minutes 3 times per week.   Interim History:  Reviewed Bioempidence results with pt: Muscle Mass: +0.2 lb Adipose Mass: -0.2 lb  03/14/2024- Endocrinology decreased Mounjaro  12.5mg  from Q7D to Q10D HWW and Endo BOTH recommend to reducing Mounjaro  12.5mg  to 10mg  at next refill Denies mass in neck, dysphagia, dyspepsia, persistent hoarseness, abdominal pain, or N/V/C   Home Fasting CBG: 134,124,124 Home PP CBG: 92,115  She vehemently denies sx's of hypoglycemia- information sheet provided to pt   Subjective:   1. Type 2 diabetes mellitus with obesity (HCC) 03/14/2024 Endocrinology OV Notes:  HPI: Pamela Love is a 73 y.o.-year-old female, returning for follow-up for DM2, dx in 2012, insulin -dependent, uncontrolled, with complications (CAD - s/p 2 stents, stage 3 CKD). Pt. previously saw Dr. Kassie, but last visit with me 6 months ago.   Interim history: No increased urination, blurry vision, nausea, chest pain. She lost ~75 lbs since she started to see the [facility] approximately 3.5 years ago. Since last visit, she had an NSTEMI in 12/2023 >> had a cath and 2 stents were placed.  She feels well now, without chest pain.  She is on Plavix . She feels she has decreased appetite and decreased craving for sweets. She had a squamous cell CA on the back of the L lower leg.   Reviewed HbA1c: Recent Labs       Lab Results  Component Value Date    HGBA1C 5.6 01/10/2024    HGBA1C 5.5 09/14/2023    HGBA1C 5.5 06/19/2023    HGBA1C 5.5 03/14/2023    HGBA1C 5.7 (H) 12/19/2022    HGBA1C 5.5 08/30/2022    HGBA1C 6.0 (H) 06/06/2022    HGBA1C 6.1 (A) 02/25/2022    HGBA1C 5.8 (A) 10/14/2021    HGBA1C 6.1 (A) 07/12/2021      Pt is on a regimen of: - Mounjaro  7.5 >> 10 >> 12.5 >> 10 >> 12.5 mg weekly - [facility] She previously tried Victoza  and Ozempic . She tried Trulicity and Ozempic  >> nausea She tried Jardiance  >> vaginal yeast infections She tried repaglinide  >> diarrhea She was on Toujeo  initially 16 units, but came off in 04/2023. Reviewing Dr. Laymond last note, she declined multiple daily injections in the past.   Pt checks her sugars 1x a day and they are: - am: 101-150, 154, 169 >> 106-141, 151 >> 124-146 >> 103-146 - 2h after b'fast: n/c - before lunch: 110 >> 88, 90, 128 >> 98-115 >>  91-122 >> n/c - 2h after lunch: n/c - before dinner: 126 >> 109, 120 >> 75-103 >> 122, 143 - 2h after dinner: n/c >> 93 >> n/c >> 130>> n/c - bedtime: 87-114 >> 74-119 >> 84-119 >> 81- 100 >> 74, 93-124 - nighttime: n/c Lowest sugar was 87 >> 74 >> 75>> 79>> 74; she has hypoglycemia awareness at 70.  Highest sugar was 180 >> 169 >> 151 >> 146 >> 174   Glucometer:Accu Chek PLAN:  1. Patient with well-controlled type 2 diabetes, only on weekly GLP-1/GIP receptor agonist, with good control.  At last visit HbA1c was 5.5%, stable, and she had another HbA1c obtained 2 months ago which was 5.6%, still excellent.  She continues to  see Dr. Verdon in the weight management clinic. - At last visit sugars were at goal or slightly above target in the morning but very well-controlled later in the day.  We did not change her regimen at that time. -At today's visit, her sugars remain mostly at goal but with occasional hyperglycemic spikes particularly in the morning.  These happen after later or larger meal the day before.  However, in the light of her even lower HbA1c, we discussed about potentially reducing the Mounjaro  dose.  This is usually adjusted by the weight management specialist but she tells me that the recommended the same thing, but the patient just picked up a 24-month supply of the higher dose.  In this case, I recommended to try to take the Mounjaro  approximately every 10 days until she runs out.  However, she will check with the weight management specialist. - I suggested to:  Patient Instructions  Please continue: - Mounjaro  12.5 mg weekly (try to decrease to every 10 days or decrease the dose to 10 mg weekly)   Please return in 6 months with your sugar log.    - we checked her HbA1c: 5.2% (even lower than before) - advised to check sugars at different times of the day - 1x a day, rotating check times - advised for yearly eye exams >> she is UTD - we will check an ACR today - return to clinic in 6 months  2. Healthcare maintenance She is concerned about slow healing of Squamous cell removal She denies sx's of infection. She sent picture to her established dermatologist- awaiting response. Picture is grainy and shows a shallow wound with pink/soft tissue in middle. No excessive redness, drainage, or streaking noted. Discussed Red Flag sx's and if any develop, immediately f/u with established dermatologist  3. Vitamin D  deficiency  Latest Reference Range & Units 01/10/24 15:23  Vitamin D , 25-Hydroxy 30.0 - 100.0 ng/mL 54.7   She is on daily OTC Vit D3 2000 international units   Assessment/Plan:   1. Type  2 diabetes mellitus with obesity (HCC) Continue Mounjaro  12.5mg  EVERY 10 DAYS Once 12.5mg    2. Healthcare maintenance (Primary) Continue to increase protein and monitor healing. F/u with dermatology PRN and as directed. Discussed Red Flag sx's and if any develop, immediately f/u with established dermatologist Pt. Verbalized understanding and agreement  3. Vitamin D  deficiency Continue OTC supplementation and monitor labs Q2-58M  4. BMI 30.0-30.9,adult -- Current BMI 29.7  Pamela Love is currently in the action stage of change. As such, her goal is to continue with weight loss efforts. She has agreed to the Category 1 Plan.   Exercise goals: Older adults should follow the adult guidelines. When older adults cannot meet the adult guidelines, they should be as physically  active as their abilities and conditions will allow.  Older adults should do exercises that maintain or improve balance if they are at risk of falling.  Older adults should determine their level of effort for physical activity relative to their level of fitness.  Older adults with chronic conditions should understand whether and how their conditions affect their ability to do regular physical activity safely.  Behavioral modification strategies: increasing lean protein intake, decreasing simple carbohydrates, increasing vegetables, increasing water intake, no skipping meals, meal planning and cooking strategies, keeping healthy foods in the home, ways to avoid boredom eating, and planning for success.  Pamela Love has agreed to follow-up with our clinic in 4 weeks. She was informed of the importance of frequent follow-up visits to maximize her success with intensive lifestyle modifications for her multiple health conditions.   Objective:   Blood pressure 125/74, pulse 76, temperature 98.9 F (37.2 C), height 5' 3 (1.6 m), weight 167 lb (75.8 kg), SpO2 98%. Body mass index is 29.58 kg/m.  General: Cooperative, alert, well developed, in  no acute distress. HEENT: Conjunctivae and lids unremarkable. Cardiovascular: Regular rhythm.  Lungs: Normal work of breathing. Neurologic: No focal deficits.   Lab Results  Component Value Date   CREATININE 0.92 01/16/2024   BUN 26 (H) 01/16/2024   NA 140 01/16/2024   K 3.9 01/16/2024   CL 109 01/16/2024   CO2 22 01/16/2024   Lab Results  Component Value Date   ALT 27 01/16/2024   AST 22 01/16/2024   ALKPHOS 71 01/16/2024   BILITOT 0.6 01/16/2024   Lab Results  Component Value Date   HGBA1C 5.2 03/14/2024   HGBA1C 5.6 01/10/2024   HGBA1C 5.5 09/14/2023   HGBA1C 5.5 06/19/2023   HGBA1C 5.5 03/14/2023   No results found for: INSULIN  Lab Results  Component Value Date   TSH 2.490 01/10/2024   Lab Results  Component Value Date   CHOL 130 01/16/2024   HDL 52 01/16/2024   LDLCALC 64 01/16/2024   TRIG 68 01/16/2024   CHOLHDL 2.5 01/16/2024   Lab Results  Component Value Date   VD25OH 54.7 01/10/2024   VD25OH 61.9 06/19/2023   VD25OH 77.1 12/19/2022   Lab Results  Component Value Date   WBC 7.0 01/16/2024   HGB 13.0 01/16/2024   HCT 38.9 01/16/2024   MCV 96.5 01/16/2024   PLT 178 01/16/2024   No results found for: IRON, TIBC, FERRITIN  Attestation Statements:   Reviewed by clinician on day of visit: allergies, medications, problem list, medical history, surgical history, family history, social history, and previous encounter notes.  )Time spent on visit including pre-visit chart review and post-visit care and charting was 25 minutes.   I have reviewed the above documentation for accuracy and completeness, and I agree with the above. -  Pamela Love d. Laurine Kuyper, NP-C

## 2024-04-03 DIAGNOSIS — I2510 Atherosclerotic heart disease of native coronary artery without angina pectoris: Secondary | ICD-10-CM | POA: Diagnosis not present

## 2024-04-03 DIAGNOSIS — E119 Type 2 diabetes mellitus without complications: Secondary | ICD-10-CM | POA: Diagnosis not present

## 2024-04-03 DIAGNOSIS — I252 Old myocardial infarction: Secondary | ICD-10-CM | POA: Diagnosis not present

## 2024-04-05 DIAGNOSIS — I2510 Atherosclerotic heart disease of native coronary artery without angina pectoris: Secondary | ICD-10-CM | POA: Diagnosis not present

## 2024-04-05 DIAGNOSIS — I252 Old myocardial infarction: Secondary | ICD-10-CM | POA: Diagnosis not present

## 2024-04-05 DIAGNOSIS — E119 Type 2 diabetes mellitus without complications: Secondary | ICD-10-CM | POA: Diagnosis not present

## 2024-04-08 DIAGNOSIS — I252 Old myocardial infarction: Secondary | ICD-10-CM | POA: Diagnosis not present

## 2024-04-08 DIAGNOSIS — I2510 Atherosclerotic heart disease of native coronary artery without angina pectoris: Secondary | ICD-10-CM | POA: Diagnosis not present

## 2024-04-08 DIAGNOSIS — E119 Type 2 diabetes mellitus without complications: Secondary | ICD-10-CM | POA: Diagnosis not present

## 2024-04-10 DIAGNOSIS — I252 Old myocardial infarction: Secondary | ICD-10-CM | POA: Diagnosis not present

## 2024-04-10 DIAGNOSIS — E119 Type 2 diabetes mellitus without complications: Secondary | ICD-10-CM | POA: Diagnosis not present

## 2024-04-10 DIAGNOSIS — I2510 Atherosclerotic heart disease of native coronary artery without angina pectoris: Secondary | ICD-10-CM | POA: Diagnosis not present

## 2024-04-12 DIAGNOSIS — I252 Old myocardial infarction: Secondary | ICD-10-CM | POA: Diagnosis not present

## 2024-04-12 DIAGNOSIS — E119 Type 2 diabetes mellitus without complications: Secondary | ICD-10-CM | POA: Diagnosis not present

## 2024-04-12 DIAGNOSIS — I2510 Atherosclerotic heart disease of native coronary artery without angina pectoris: Secondary | ICD-10-CM | POA: Diagnosis not present

## 2024-04-15 DIAGNOSIS — M1991 Primary osteoarthritis, unspecified site: Secondary | ICD-10-CM | POA: Diagnosis not present

## 2024-04-15 DIAGNOSIS — Z6830 Body mass index (BMI) 30.0-30.9, adult: Secondary | ICD-10-CM | POA: Diagnosis not present

## 2024-04-15 DIAGNOSIS — M7531 Calcific tendinitis of right shoulder: Secondary | ICD-10-CM | POA: Diagnosis not present

## 2024-04-15 DIAGNOSIS — M7532 Calcific tendinitis of left shoulder: Secondary | ICD-10-CM | POA: Diagnosis not present

## 2024-04-15 DIAGNOSIS — M1120 Other chondrocalcinosis, unspecified site: Secondary | ICD-10-CM | POA: Diagnosis not present

## 2024-04-15 DIAGNOSIS — E669 Obesity, unspecified: Secondary | ICD-10-CM | POA: Diagnosis not present

## 2024-04-17 DIAGNOSIS — L578 Other skin changes due to chronic exposure to nonionizing radiation: Secondary | ICD-10-CM | POA: Diagnosis not present

## 2024-04-17 DIAGNOSIS — D225 Melanocytic nevi of trunk: Secondary | ICD-10-CM | POA: Diagnosis not present

## 2024-04-17 DIAGNOSIS — Z85828 Personal history of other malignant neoplasm of skin: Secondary | ICD-10-CM | POA: Diagnosis not present

## 2024-04-17 DIAGNOSIS — Z86018 Personal history of other benign neoplasm: Secondary | ICD-10-CM | POA: Diagnosis not present

## 2024-04-17 DIAGNOSIS — L814 Other melanin hyperpigmentation: Secondary | ICD-10-CM | POA: Diagnosis not present

## 2024-04-17 DIAGNOSIS — C44729 Squamous cell carcinoma of skin of left lower limb, including hip: Secondary | ICD-10-CM | POA: Diagnosis not present

## 2024-04-17 DIAGNOSIS — L570 Actinic keratosis: Secondary | ICD-10-CM | POA: Diagnosis not present

## 2024-04-17 DIAGNOSIS — L821 Other seborrheic keratosis: Secondary | ICD-10-CM | POA: Diagnosis not present

## 2024-04-19 DIAGNOSIS — I2510 Atherosclerotic heart disease of native coronary artery without angina pectoris: Secondary | ICD-10-CM | POA: Diagnosis not present

## 2024-04-19 DIAGNOSIS — E119 Type 2 diabetes mellitus without complications: Secondary | ICD-10-CM | POA: Diagnosis not present

## 2024-04-19 DIAGNOSIS — I252 Old myocardial infarction: Secondary | ICD-10-CM | POA: Diagnosis not present

## 2024-04-22 ENCOUNTER — Ambulatory Visit (INDEPENDENT_AMBULATORY_CARE_PROVIDER_SITE_OTHER): Admitting: Family Medicine

## 2024-04-22 DIAGNOSIS — E119 Type 2 diabetes mellitus without complications: Secondary | ICD-10-CM | POA: Diagnosis not present

## 2024-04-22 DIAGNOSIS — I252 Old myocardial infarction: Secondary | ICD-10-CM | POA: Diagnosis not present

## 2024-04-22 DIAGNOSIS — I2510 Atherosclerotic heart disease of native coronary artery without angina pectoris: Secondary | ICD-10-CM | POA: Diagnosis not present

## 2024-04-23 ENCOUNTER — Ambulatory Visit (INDEPENDENT_AMBULATORY_CARE_PROVIDER_SITE_OTHER): Admitting: Adult Health

## 2024-04-24 ENCOUNTER — Ambulatory Visit (INDEPENDENT_AMBULATORY_CARE_PROVIDER_SITE_OTHER): Admitting: Adult Health

## 2024-04-24 ENCOUNTER — Encounter (INDEPENDENT_AMBULATORY_CARE_PROVIDER_SITE_OTHER): Payer: Self-pay | Admitting: Adult Health

## 2024-04-24 VITALS — BP 114/73 | HR 69 | Temp 98.2°F | Ht 63.0 in | Wt 170.0 lb

## 2024-04-24 DIAGNOSIS — E559 Vitamin D deficiency, unspecified: Secondary | ICD-10-CM | POA: Diagnosis not present

## 2024-04-24 DIAGNOSIS — Z794 Long term (current) use of insulin: Secondary | ICD-10-CM

## 2024-04-24 DIAGNOSIS — R632 Polyphagia: Secondary | ICD-10-CM | POA: Diagnosis not present

## 2024-04-24 DIAGNOSIS — Z7985 Long-term (current) use of injectable non-insulin antidiabetic drugs: Secondary | ICD-10-CM

## 2024-04-24 DIAGNOSIS — Z0000 Encounter for general adult medical examination without abnormal findings: Secondary | ICD-10-CM | POA: Diagnosis not present

## 2024-04-24 DIAGNOSIS — E1169 Type 2 diabetes mellitus with other specified complication: Secondary | ICD-10-CM | POA: Diagnosis not present

## 2024-04-24 DIAGNOSIS — Z6830 Body mass index (BMI) 30.0-30.9, adult: Secondary | ICD-10-CM

## 2024-04-24 NOTE — Progress Notes (Signed)
 WEIGHT SUMMARY AND BIOMETRICS  Vitals Temp: 98.2 F (36.8 C) BP: 114/73 Pulse Rate: 69 SpO2: 99 %   Anthropometric Measurements Height: 5' 3 (1.6 m) Weight: 170 lb (77.1 kg) BMI (Calculated): 30.12 Weight at Last Visit: 167 lb Weight Lost Since Last Visit: 0 Weight Gained Since Last Visit: 3 lb Starting Weight: 227 lb Total Weight Loss (lbs): 57 lb (25.9 kg) Peak Weight: 250 lb   Body Composition  Body Fat %: 45.1 % Fat Mass (lbs): 77 lbs Muscle Mass (lbs): 88.8 lbs Visceral Fat Rating : 13   Other Clinical Data Fasting: no Labs: no Today's Visit #: 12 Starting Date: 03/17/20    Chief Complaint:   OBESITY Pamela Love is here to discuss her progress with her obesity treatment plan.  She is on the the Category 1 Plan and states she is following her eating plan approximately 60 % of the time.  She states she is exercising Cardiac PT 45 minutes 3 times per week.  Interim History:  Pamela Love provided the following food recall that is typical of a day: Breakfast: Yogurt with fruit Lunch: 1/2 to 1 Fair Like Protein Shake with either meat protein sandwich or high protein pancakes Snack: Cubes of cheese, peanut butter crackers, everything pitas, built puff bars Dinner: meat protein with 2 servings of vegetables  Cravings-  She reports breakthrough polyphagia. She feels that her sweet tooth has worsened. She is on Mounjaro  12.5mg  Q10D  Healthcare maintenance She was started on Ciprofloxacin yesterday for skin infection of LLE She denies fevers, night sweats, malaise, or poor appetite.  Subjective:   1. Healthcare maintenance She was started on Ciprofloxacin yesterday for skin infection of LLE She denies fevers, night sweats, malaise, or poor appetite.  2. Polyphagia She reports breakthrough polyphagia. She feels that her sweet tooth has worsened. She requests to increase Mounjaro  12.5mg  from Q10 days to Q7days Request denied- as her A1c has been trending  down and her weight has been stable. Discussed at length the risks/benefits of proper use of GIP/GLP-1 therapy and reiterated that the dose will not be increased. At next refill Mounjaro  will be reduced to 10mg  She verabl\alized understanding and agreement.  3. Type 2 diabetes mellitus with other specified complication, with long-term current use of insulin  (HCC) Home CBG Fasting 121, 139 PP 102, 117 She denies sx's of hypoglycemia  03/14/2024- Endocrinology decreased Mounjaro  12.5mg  from Q7D to Q10D HWW and Endo BOTH recommend to reducing Mounjaro  12.5mg  to 10mg  at next refill  4. Vitamin D  deficiency  Latest Reference Range & Units 01/10/24 15:23  Vitamin D , 25-Hydroxy 30.0 - 100.0 ng/mL 54.7   She is on Cholecalciferol (VITAMIN D ) 50 MCG (2000 UT) CAPS Take 1 capsule (2,000 Units total) by mouth every morning.   Assessment/Plan:   1. Healthcare maintenance (Primary) Increase lean protein intake Complete course of ABX Monitor for constitutional sx's of infection  2. Polyphagia Increase lean protein intake Remain well hydrated with water Continue Mounjaro  12.5mg  Q10 days  3. Type 2 diabetes mellitus with other specified complication, with long-term current use of insulin  (HCC) Continue Mounjaro  12.5mg  every 10 days Next refill will be Mounjaro  10mg   4. Vitamin D  deficiency Continue  Cholecalciferol (VITAMIN D ) 50 MCG (2000 UT) CAPS Take 1 capsule (2,000 Units total) by mouth every morning.   5. BMI 30.0-30.9,adult -- Current BMI 30.2  Pamela Love is currently in the action stage of change. As such, her goal is to continue with weight loss efforts.  She has agreed to the Category 1 Plan.   Exercise goals: Older adults should follow the adult guidelines. When older adults cannot meet the adult guidelines, they should be as physically active as their abilities and conditions will allow.  Older adults should do exercises that maintain or improve balance if they are at risk of  falling.  Older adults should determine their level of effort for physical activity relative to their level of fitness.  Older adults with chronic conditions should understand whether and how their conditions affect their ability to do regular physical activity safely.  Behavioral modification strategies: increasing lean protein intake, decreasing simple carbohydrates, increasing vegetables, increasing water intake, meal planning and cooking strategies, keeping healthy foods in the home, ways to avoid boredom eating, and planning for success.  Pamela Love has agreed to follow-up with our clinic in 4 weeks. She was informed of the importance of frequent follow-up visits to maximize her success with intensive lifestyle modifications for her multiple health conditions.   Objective:   Blood pressure 114/73, pulse 69, temperature 98.2 F (36.8 C), height 5' 3 (1.6 m), weight 170 lb (77.1 kg), SpO2 99%. Body mass index is 30.11 kg/m.  General: Cooperative, alert, well developed, in no acute distress. HEENT: Conjunctivae and lids unremarkable. Cardiovascular: Regular rhythm.  Lungs: Normal work of breathing. Neurologic: No focal deficits.   Lab Results  Component Value Date   CREATININE 0.92 01/16/2024   BUN 26 (H) 01/16/2024   NA 140 01/16/2024   K 3.9 01/16/2024   CL 109 01/16/2024   CO2 22 01/16/2024   Lab Results  Component Value Date   ALT 27 01/16/2024   AST 22 01/16/2024   ALKPHOS 71 01/16/2024   BILITOT 0.6 01/16/2024   Lab Results  Component Value Date   HGBA1C 5.2 03/14/2024   HGBA1C 5.6 01/10/2024   HGBA1C 5.5 09/14/2023   HGBA1C 5.5 06/19/2023   HGBA1C 5.5 03/14/2023   No results found for: INSULIN  Lab Results  Component Value Date   TSH 2.490 01/10/2024   Lab Results  Component Value Date   CHOL 130 01/16/2024   HDL 52 01/16/2024   LDLCALC 64 01/16/2024   TRIG 68 01/16/2024   CHOLHDL 2.5 01/16/2024   Lab Results  Component Value Date   VD25OH 54.7  01/10/2024   VD25OH 61.9 06/19/2023   VD25OH 77.1 12/19/2022   Lab Results  Component Value Date   WBC 7.0 01/16/2024   HGB 13.0 01/16/2024   HCT 38.9 01/16/2024   MCV 96.5 01/16/2024   PLT 178 01/16/2024   No results found for: IRON, TIBC, FERRITIN  Attestation Statements:   Reviewed by clinician on day of visit: allergies, medications, problem list, medical history, surgical history, family history, social history, and previous encounter notes.  I have reviewed the above documentation for accuracy and completeness, and I agree with the above. -  Gene Colee d. Tereka Thorley, NP-C

## 2024-04-26 DIAGNOSIS — I252 Old myocardial infarction: Secondary | ICD-10-CM | POA: Diagnosis not present

## 2024-04-26 DIAGNOSIS — E119 Type 2 diabetes mellitus without complications: Secondary | ICD-10-CM | POA: Diagnosis not present

## 2024-04-26 DIAGNOSIS — I2510 Atherosclerotic heart disease of native coronary artery without angina pectoris: Secondary | ICD-10-CM | POA: Diagnosis not present

## 2024-04-29 DIAGNOSIS — I252 Old myocardial infarction: Secondary | ICD-10-CM | POA: Diagnosis not present

## 2024-04-29 DIAGNOSIS — E119 Type 2 diabetes mellitus without complications: Secondary | ICD-10-CM | POA: Diagnosis not present

## 2024-04-29 DIAGNOSIS — I2510 Atherosclerotic heart disease of native coronary artery without angina pectoris: Secondary | ICD-10-CM | POA: Diagnosis not present

## 2024-05-01 DIAGNOSIS — I252 Old myocardial infarction: Secondary | ICD-10-CM | POA: Diagnosis not present

## 2024-05-01 DIAGNOSIS — I2510 Atherosclerotic heart disease of native coronary artery without angina pectoris: Secondary | ICD-10-CM | POA: Diagnosis not present

## 2024-05-01 DIAGNOSIS — E119 Type 2 diabetes mellitus without complications: Secondary | ICD-10-CM | POA: Diagnosis not present

## 2024-05-02 ENCOUNTER — Encounter (HOSPITAL_BASED_OUTPATIENT_CLINIC_OR_DEPARTMENT_OTHER): Payer: Self-pay | Admitting: Cardiology

## 2024-05-02 ENCOUNTER — Ambulatory Visit (HOSPITAL_BASED_OUTPATIENT_CLINIC_OR_DEPARTMENT_OTHER): Admitting: Cardiology

## 2024-05-02 VITALS — BP 111/67 | HR 79 | Resp 16 | Ht 63.0 in | Wt 174.0 lb

## 2024-05-02 DIAGNOSIS — Z6832 Body mass index (BMI) 32.0-32.9, adult: Secondary | ICD-10-CM

## 2024-05-02 DIAGNOSIS — E782 Mixed hyperlipidemia: Secondary | ICD-10-CM | POA: Diagnosis not present

## 2024-05-02 DIAGNOSIS — G4733 Obstructive sleep apnea (adult) (pediatric): Secondary | ICD-10-CM | POA: Diagnosis not present

## 2024-05-02 DIAGNOSIS — E6609 Other obesity due to excess calories: Secondary | ICD-10-CM

## 2024-05-02 DIAGNOSIS — I2510 Atherosclerotic heart disease of native coronary artery without angina pectoris: Secondary | ICD-10-CM | POA: Diagnosis not present

## 2024-05-02 DIAGNOSIS — Z955 Presence of coronary angioplasty implant and graft: Secondary | ICD-10-CM

## 2024-05-02 DIAGNOSIS — E66811 Obesity, class 1: Secondary | ICD-10-CM

## 2024-05-02 DIAGNOSIS — I10 Essential (primary) hypertension: Secondary | ICD-10-CM

## 2024-05-02 NOTE — Patient Instructions (Signed)
 Medication Instructions:  No changes *If you need a refill on your cardiac medications before your next appointment, please call your pharmacy*  Lab Work: none  Testing/Procedures: none  Follow-Up: At [facility], you and your health needs are our priority.  As part of our continuing mission to provide you with exceptional heart care, our providers are all part of one team.  This team includes your primary Cardiologist (physician) and Advanced Practice Providers or APPs (Physician Assistants and Nurse Practitioners) who all work together to provide you with the care you need, when you need it.  Your next appointment:   6 month(s)  Provider:   Shelda Bruckner, MD

## 2024-05-02 NOTE — Progress Notes (Signed)
 Cardiology Office Note:  .   Date:  05/02/2024  ID:  Pamela Love, DOB March 20, 1951, MRN [id number] PCP: Loreli Kins, MD  [facility] Providers Cardiologist:  Shelda Bruckner, MD {  History of Present Illness: .   Pamela Love is a 73 y.o. female with a hx of arthritis, CAD, CKD stage IIIa, diabetes type 2,  hyperlipidemia, hypertension, obesity, and OSA on CPAP who is seen for follow-up. She was previously a patient of Dr. Sheena.      Prior clinical ASCVD: CAD s/p PCI 2022. Admitted 12/2023 for NSTEMI, no obstructive disease on cath. Echo 03/2024 unremarkable. Comorbid conditions: CKD stage IIIa, diabetes type 2, hyperlipidemia, hypertension, metabolic syndrome  Today: Overall doing well. A little fatigue, occasional swelling in her ankles, nothing major. Enjoying cardiac rehab, no issues with it. Blood pressures have been well controlled. No major shortness of breath.   ROS: Denies shortness of breath with normal exertion. No PND, orthopnea, or unexpected weight gain. No syncope or palpitations. ROS otherwise negative except as noted.   Studies Reviewed: SABRA    EKG:       Physical Exam:   VS:  BP 111/67 (BP Location: Left Arm, Patient Position: Sitting, Cuff Size: Large)   Pulse 79   Resp 16   Ht 5' 3 (1.6 m)   Wt 174 lb (78.9 kg)   SpO2 94%   BMI 30.82 kg/m    Wt Readings from Last 3 Encounters:  05/02/24 174 lb (78.9 kg)  04/24/24 170 lb (77.1 kg)  04/02/24 167 lb (75.8 kg)    GEN: Well nourished, well developed in no acute distress HEENT: Normal, moist mucous membranes NECK: No JVD CARDIAC: regular rhythm, normal S1 and S2, no rubs or gallops. No murmur. VASCULAR: Radial and DP pulses 2+ bilaterally. No carotid bruits RESPIRATORY:  Clear to auscultation without rales, wheezing or rhonchi  ABDOMEN: Soft, non-tender, non-distended MUSCULOSKELETAL:  Ambulates independently SKIN: Warm and dry, no edema NEUROLOGIC:  Alert and oriented x 3. No  focal neuro deficits noted. PSYCHIATRIC:  Normal affect    ASSESSMENT AND PLAN: .    CAD, with prior PCI Mixed hyperlipidemia Type II diabetes Obesity, BMI 30 (starting BMI 40 in 2021) -Aspirin  81 mg, clopidogrel  75 mg -statin: continue rosuvastatin  40 mg daily -LDL goal <70, LDL 64 12/2023 -antianginals: beta blocker (carvedilol ), ranolazine . Not on isosorbide  as it made her hypotensive -Given type II diabetes and CAD, she is on GLP1RA. Tolerates mounjaro  better than ozempic  -has sublingual nitroglycerin , counseled on lifespan of this medication -risk factor modification, as below -counseled on red flag warning signs that need immediate medical attention    Hypertension:  -average at goal of <130/80, continue carvedilol . Had issues with hypotension in the past, would use home averages and not increase therapy unless consistently elevated   OSA: -was followed by Dr. Burnard, benefits from CPAP therapy for her OSA.  CV risk counseling and prevention -recommend heart healthy/Mediterranean diet, with whole grains, fruits, vegetable, fish, lean meats, nuts, and olive oil. Limit salt. -recommend moderate walking, 3-5 times/week for 30-50 minutes each session. Aim for at least 150 minutes.week. Goal should be pace of 3 miles/hours, or walking 1.5 miles in 30 minutes -recommend avoidance of tobacco products. Avoid excess alcohol.  Dispo: 6 mos or sooner as needed  Signed, Shelda Bruckner, MD   Shelda Bruckner, MD, PhD, [facility]  [facility]  [facility] at [facility] [phone]  [address], [address] [place], [place] [address] [phone]

## 2024-05-03 ENCOUNTER — Other Ambulatory Visit (HOSPITAL_BASED_OUTPATIENT_CLINIC_OR_DEPARTMENT_OTHER): Payer: Self-pay | Admitting: Cardiology

## 2024-05-03 ENCOUNTER — Telehealth: Payer: Self-pay | Admitting: Cardiology

## 2024-05-03 DIAGNOSIS — I252 Old myocardial infarction: Secondary | ICD-10-CM | POA: Diagnosis not present

## 2024-05-03 DIAGNOSIS — E119 Type 2 diabetes mellitus without complications: Secondary | ICD-10-CM | POA: Diagnosis not present

## 2024-05-03 DIAGNOSIS — I2510 Atherosclerotic heart disease of native coronary artery without angina pectoris: Secondary | ICD-10-CM

## 2024-05-03 NOTE — Telephone Encounter (Signed)
 Returned call to patient.  She said at cardiac rehab her HR was noted to be elevated - just over 100 and stayed that way during her rest period.  They sent her home and adv to wear a watch to monitor her HR.  And my blood pressure is fine.  --but when asked, it is 90s-100 systolically.  Asked about hydration - states she is not drinking enough probably.  Thinks today so far she's only had 14 ounces of liquid.  Adv her to increase fluids over the weekend and see if she notices HR returning to her normal range and BP may come up some.  She voices understanding and agreement.

## 2024-05-03 NOTE — Telephone Encounter (Signed)
 STAT if HR is under 50 or over 120  (normal HR is 60-100 beats per minute)  What is your heart rate? 102; 106; 104; 104; 103  Do you have a log of your heart rate readings (document readings)?    Do you have any other symptoms? None, states HR is normally in the 80s .

## 2024-05-06 DIAGNOSIS — I2510 Atherosclerotic heart disease of native coronary artery without angina pectoris: Secondary | ICD-10-CM | POA: Diagnosis not present

## 2024-05-06 DIAGNOSIS — E119 Type 2 diabetes mellitus without complications: Secondary | ICD-10-CM | POA: Diagnosis not present

## 2024-05-06 DIAGNOSIS — I252 Old myocardial infarction: Secondary | ICD-10-CM | POA: Diagnosis not present

## 2024-05-07 ENCOUNTER — Ambulatory Visit (INDEPENDENT_AMBULATORY_CARE_PROVIDER_SITE_OTHER): Admitting: Adult Health

## 2024-05-07 VITALS — BP 113/69 | HR 75 | Temp 99.0°F | Ht 63.0 in | Wt 170.0 lb

## 2024-05-07 DIAGNOSIS — E559 Vitamin D deficiency, unspecified: Secondary | ICD-10-CM | POA: Diagnosis not present

## 2024-05-07 DIAGNOSIS — Z794 Long term (current) use of insulin: Secondary | ICD-10-CM | POA: Diagnosis not present

## 2024-05-07 DIAGNOSIS — R632 Polyphagia: Secondary | ICD-10-CM

## 2024-05-07 DIAGNOSIS — Z7985 Long-term (current) use of injectable non-insulin antidiabetic drugs: Secondary | ICD-10-CM

## 2024-05-07 DIAGNOSIS — I25118 Atherosclerotic heart disease of native coronary artery with other forms of angina pectoris: Secondary | ICD-10-CM

## 2024-05-07 DIAGNOSIS — Z0000 Encounter for general adult medical examination without abnormal findings: Secondary | ICD-10-CM

## 2024-05-07 DIAGNOSIS — Z6830 Body mass index (BMI) 30.0-30.9, adult: Secondary | ICD-10-CM | POA: Diagnosis not present

## 2024-05-07 DIAGNOSIS — E1169 Type 2 diabetes mellitus with other specified complication: Secondary | ICD-10-CM | POA: Diagnosis not present

## 2024-05-07 NOTE — Progress Notes (Signed)
 WEIGHT SUMMARY AND BIOMETRICS  Vitals Temp: 99 F (37.2 C) BP: 113/69 Pulse Rate: 75 SpO2: 98 %   Anthropometric Measurements Height: 5' 3 (1.6 m) Weight: 170 lb (77.1 kg) BMI (Calculated): 30.12 Weight at Last Visit: 170lb Weight Lost Since Last Visit: 0lb Weight Gained Since Last Visit: 0lb Starting Weight: 227lb Total Weight Loss (lbs): 57 lb (25.9 kg) Peak Weight: 250lb   Body Composition  Body Fat %: 44 % Fat Mass (lbs): 75.2 lbs Muscle Mass (lbs): 90.8 lbs Visceral Fat Rating : 13   Other Clinical Data Fasting: No Labs: No Today's Visit #: 73 Starting Date: 03/17/20    Chief Complaint:   OBESITY [place] is here to discuss her progress with her obesity treatment plan.  She is on the the Category 2 Plan and states she is following her eating plan approximately 75 % of the time.  She states she is exercising Cardiac Rehabilitation 45-60 minutes 3 times per week.  Interim History:  Reviewed Bioimpedance Results with pt: Muscle Mass: +2 lbs Adipose Mass: -1.8 lbs  She continues to participate in Cardiac Rehabilitation 3 times per week. She estimates to have 12 sessions to complete. She is looking forward to a trip to [place] next week- she anticipates frequent walking and enjoying the local cuisine.  Subjective:   1. Healthcare maintenance She was completed course of Ciprofloxacin for skin infection of LLE. She has [place]  Band-Aid atop the slow healing wound. She denies constitutional s/s of infection. She has sent updated photos of wound to her providers that are following her for this complaint.  2. Type 2 diabetes mellitus with other specified complication, with long-term current use of insulin  (HCC) Home CBG Fasting 85 PP 133 She denies sx's of hypoglycemia   03/14/2024- Endocrinology decreased Mounjaro  12.5mg  from Q7D to Q10D HWW and Endo BOTH recommend to reducing Mounjaro  12.5mg  to 10mg  at next refill  3. Vitamin D   deficiency Continue daily   Cholecalciferol (VITAMIN D ) 50 MCG (2000 UT) CAPS  Monitor Labs  4. Coronary artery disease involving native coronary artery of native heart with other form of angina pectoris 05/02/2024 Cardiology OV Notes: History of Present Illness: .   [place] is a 73 y.o. female with a hx of arthritis, CAD, CKD stage IIIa, diabetes type 2,  hyperlipidemia, hypertension, obesity, and OSA on CPAP who is seen for follow-up. She was previously a patient of Dr. Sheena.      Prior clinical ASCVD: CAD s/p PCI 2022. Admitted 12/2023 for NSTEMI, no obstructive disease on cath. Echo 03/2024 unremarkable. Comorbid conditions: CKD stage IIIa, diabetes type 2, hyperlipidemia, hypertension, metabolic syndrome   Today: Overall doing well. A little fatigue, occasional swelling in her ankles, nothing major. Enjoying cardiac rehab, no issues with it. Blood pressures have been well controlled. No major shortness of breath.    ROS: Denies shortness of breath with normal exertion. No PND, orthopnea, or unexpected weight gain. No syncope or palpitations. ROS otherwise negative except as noted.    Assessment/Plan:   1. Healthcare maintenance (Primary) F/u with Derm as directed and as needed. Monitor for sx's of infection and/other wound healing complications.  2. Type 2 diabetes mellitus with other specified complication, with long-term current use of insulin  (HCC) Continue Mounjaro  12.5mg  Q10 days Next refill decrease to 10mg   3. Vitamin D  deficiency Continue daily   Cholecalciferol (VITAMIN D ) 50 MCG (2000 UT) CAPS  Monitor Labs  4. Coronary artery disease involving native coronary  artery of native heart with other form of angina pectoris Complete Cardiac Rehabilitation  Continue healthy eating and daily activity  5. BMI 30.0-30.9,adult -- Current BMI 30.3  [place] is currently in the action stage of change. As such, her goal is to continue with weight loss efforts. She  has agreed to the Category 1 Plan and the Category 2 Plan.   Exercise goals: Older adults should follow the adult guidelines. When older adults cannot meet the adult guidelines, they should be as physically active as their abilities and conditions will allow.  Older adults should do exercises that maintain or improve balance if they are at risk of falling.  Older adults should determine their level of effort for physical activity relative to their level of fitness.  Older adults with chronic conditions should understand whether and how their conditions affect their ability to do regular physical activity safely.  Behavioral modification strategies: increasing lean protein intake, decreasing simple carbohydrates, increasing vegetables, increasing water intake, no skipping meals, meal planning and cooking strategies, keeping healthy foods in the home, ways to avoid boredom eating, and planning for success.  [place] has agreed to follow-up with our clinic in 4 weeks. She was informed of the importance of frequent follow-up visits to maximize her success with intensive lifestyle modifications for her multiple health conditions.   Objective:   Blood pressure 113/69, pulse 75, temperature 99 F (37.2 C), height 5' 3 (1.6 m), weight 170 lb (77.1 kg), SpO2 98%. Body mass index is 30.11 kg/m.  General: Cooperative, alert, well developed, in no acute distress. HEENT: Conjunctivae and lids unremarkable. Cardiovascular: Regular rhythm.  Lungs: Normal work of breathing. Neurologic: No focal deficits.   Lab Results  Component Value Date   CREATININE 0.92 01/16/2024   BUN 26 (H) 01/16/2024   NA 140 01/16/2024   K 3.9 01/16/2024   CL 109 01/16/2024   CO2 22 01/16/2024   Lab Results  Component Value Date   ALT 27 01/16/2024   AST 22 01/16/2024   ALKPHOS 71 01/16/2024   BILITOT 0.6 01/16/2024   Lab Results  Component Value Date   HGBA1C 5.2 03/14/2024   HGBA1C 5.6 01/10/2024   HGBA1C 5.5  09/14/2023   HGBA1C 5.5 06/19/2023   HGBA1C 5.5 03/14/2023   No results found for: INSULIN  Lab Results  Component Value Date   TSH 2.490 01/10/2024   Lab Results  Component Value Date   CHOL 130 01/16/2024   HDL 52 01/16/2024   LDLCALC 64 01/16/2024   TRIG 68 01/16/2024   CHOLHDL 2.5 01/16/2024   Lab Results  Component Value Date   VD25OH 54.7 01/10/2024   VD25OH 61.9 06/19/2023   VD25OH 77.1 12/19/2022   Lab Results  Component Value Date   WBC 7.0 01/16/2024   HGB 13.0 01/16/2024   HCT 38.9 01/16/2024   MCV 96.5 01/16/2024   PLT 178 01/16/2024   No results found for: IRON, TIBC, FERRITIN  Attestation Statements:   Reviewed by clinician on day of visit: allergies, medications, problem list, medical history, surgical history, family history, social history, and previous encounter notes.  Time spent on visit including pre-visit chart review and post-visit care and charting was 27 minutes.   I have reviewed the above documentation for accuracy and completeness, and I agree with the above. -  Gabriel Conry d. Daniyal Tabor, NP-C

## 2024-05-08 DIAGNOSIS — E119 Type 2 diabetes mellitus without complications: Secondary | ICD-10-CM | POA: Diagnosis not present

## 2024-05-08 DIAGNOSIS — I252 Old myocardial infarction: Secondary | ICD-10-CM | POA: Diagnosis not present

## 2024-05-08 DIAGNOSIS — I2510 Atherosclerotic heart disease of native coronary artery without angina pectoris: Secondary | ICD-10-CM | POA: Diagnosis not present

## 2024-05-10 DIAGNOSIS — I2510 Atherosclerotic heart disease of native coronary artery without angina pectoris: Secondary | ICD-10-CM | POA: Diagnosis not present

## 2024-05-10 DIAGNOSIS — E119 Type 2 diabetes mellitus without complications: Secondary | ICD-10-CM | POA: Diagnosis not present

## 2024-05-10 DIAGNOSIS — I252 Old myocardial infarction: Secondary | ICD-10-CM | POA: Diagnosis not present

## 2024-05-20 DIAGNOSIS — I2510 Atherosclerotic heart disease of native coronary artery without angina pectoris: Secondary | ICD-10-CM | POA: Diagnosis not present

## 2024-05-20 DIAGNOSIS — E119 Type 2 diabetes mellitus without complications: Secondary | ICD-10-CM | POA: Diagnosis not present

## 2024-05-20 DIAGNOSIS — I252 Old myocardial infarction: Secondary | ICD-10-CM | POA: Diagnosis not present

## 2024-05-22 DIAGNOSIS — I252 Old myocardial infarction: Secondary | ICD-10-CM | POA: Diagnosis not present

## 2024-05-22 DIAGNOSIS — I2510 Atherosclerotic heart disease of native coronary artery without angina pectoris: Secondary | ICD-10-CM | POA: Diagnosis not present

## 2024-05-22 DIAGNOSIS — E119 Type 2 diabetes mellitus without complications: Secondary | ICD-10-CM | POA: Diagnosis not present

## 2024-05-23 DIAGNOSIS — G4733 Obstructive sleep apnea (adult) (pediatric): Secondary | ICD-10-CM | POA: Diagnosis not present

## 2024-05-24 DIAGNOSIS — E119 Type 2 diabetes mellitus without complications: Secondary | ICD-10-CM | POA: Diagnosis not present

## 2024-05-24 DIAGNOSIS — I252 Old myocardial infarction: Secondary | ICD-10-CM | POA: Diagnosis not present

## 2024-05-24 DIAGNOSIS — I2510 Atherosclerotic heart disease of native coronary artery without angina pectoris: Secondary | ICD-10-CM | POA: Diagnosis not present

## 2024-05-27 DIAGNOSIS — E119 Type 2 diabetes mellitus without complications: Secondary | ICD-10-CM | POA: Diagnosis not present

## 2024-05-27 DIAGNOSIS — I252 Old myocardial infarction: Secondary | ICD-10-CM | POA: Diagnosis not present

## 2024-05-27 DIAGNOSIS — I2510 Atherosclerotic heart disease of native coronary artery without angina pectoris: Secondary | ICD-10-CM | POA: Diagnosis not present

## 2024-05-28 ENCOUNTER — Encounter (INDEPENDENT_AMBULATORY_CARE_PROVIDER_SITE_OTHER): Payer: Self-pay

## 2024-05-28 ENCOUNTER — Ambulatory Visit (INDEPENDENT_AMBULATORY_CARE_PROVIDER_SITE_OTHER): Admitting: Adult Health

## 2024-05-29 DIAGNOSIS — I252 Old myocardial infarction: Secondary | ICD-10-CM | POA: Diagnosis not present

## 2024-05-29 DIAGNOSIS — I2510 Atherosclerotic heart disease of native coronary artery without angina pectoris: Secondary | ICD-10-CM | POA: Diagnosis not present

## 2024-05-29 DIAGNOSIS — E119 Type 2 diabetes mellitus without complications: Secondary | ICD-10-CM | POA: Diagnosis not present

## 2024-05-31 ENCOUNTER — Other Ambulatory Visit: Payer: Self-pay | Admitting: Cardiology

## 2024-05-31 DIAGNOSIS — I252 Old myocardial infarction: Secondary | ICD-10-CM | POA: Diagnosis not present

## 2024-05-31 DIAGNOSIS — E119 Type 2 diabetes mellitus without complications: Secondary | ICD-10-CM | POA: Diagnosis not present

## 2024-05-31 DIAGNOSIS — I2510 Atherosclerotic heart disease of native coronary artery without angina pectoris: Secondary | ICD-10-CM | POA: Diagnosis not present

## 2024-06-05 DIAGNOSIS — E119 Type 2 diabetes mellitus without complications: Secondary | ICD-10-CM | POA: Diagnosis not present

## 2024-06-05 DIAGNOSIS — I252 Old myocardial infarction: Secondary | ICD-10-CM | POA: Diagnosis not present

## 2024-06-05 DIAGNOSIS — I2510 Atherosclerotic heart disease of native coronary artery without angina pectoris: Secondary | ICD-10-CM | POA: Diagnosis not present

## 2024-06-10 DIAGNOSIS — E119 Type 2 diabetes mellitus without complications: Secondary | ICD-10-CM | POA: Diagnosis not present

## 2024-06-10 DIAGNOSIS — I252 Old myocardial infarction: Secondary | ICD-10-CM | POA: Diagnosis not present

## 2024-06-10 DIAGNOSIS — I2510 Atherosclerotic heart disease of native coronary artery without angina pectoris: Secondary | ICD-10-CM | POA: Diagnosis not present

## 2024-06-12 DIAGNOSIS — I2510 Atherosclerotic heart disease of native coronary artery without angina pectoris: Secondary | ICD-10-CM | POA: Diagnosis not present

## 2024-06-12 DIAGNOSIS — W01198A Fall on same level from slipping, tripping and stumbling with subsequent striking against other object, initial encounter: Secondary | ICD-10-CM | POA: Diagnosis not present

## 2024-06-12 DIAGNOSIS — S0512XA Contusion of eyeball and orbital tissues, left eye, initial encounter: Secondary | ICD-10-CM | POA: Diagnosis not present

## 2024-06-12 DIAGNOSIS — S01112A Laceration without foreign body of left eyelid and periocular area, initial encounter: Secondary | ICD-10-CM | POA: Diagnosis not present

## 2024-06-12 DIAGNOSIS — I252 Old myocardial infarction: Secondary | ICD-10-CM | POA: Diagnosis not present

## 2024-06-12 DIAGNOSIS — S0181XA Laceration without foreign body of other part of head, initial encounter: Secondary | ICD-10-CM | POA: Diagnosis not present

## 2024-06-12 DIAGNOSIS — E119 Type 2 diabetes mellitus without complications: Secondary | ICD-10-CM | POA: Diagnosis not present

## 2024-06-13 ENCOUNTER — Ambulatory Visit (INDEPENDENT_AMBULATORY_CARE_PROVIDER_SITE_OTHER): Admitting: Adult Health

## 2024-06-13 ENCOUNTER — Ambulatory Visit (INDEPENDENT_AMBULATORY_CARE_PROVIDER_SITE_OTHER): Payer: Self-pay | Admitting: Adult Health

## 2024-06-13 ENCOUNTER — Encounter (INDEPENDENT_AMBULATORY_CARE_PROVIDER_SITE_OTHER): Payer: Self-pay | Admitting: Adult Health

## 2024-06-13 VITALS — BP 116/68 | HR 78 | Temp 98.1°F | Ht 63.0 in | Wt 172.0 lb

## 2024-06-13 DIAGNOSIS — I25118 Atherosclerotic heart disease of native coronary artery with other forms of angina pectoris: Secondary | ICD-10-CM | POA: Diagnosis not present

## 2024-06-13 DIAGNOSIS — E669 Obesity, unspecified: Secondary | ICD-10-CM | POA: Diagnosis not present

## 2024-06-13 DIAGNOSIS — W19XXXD Unspecified fall, subsequent encounter: Secondary | ICD-10-CM

## 2024-06-13 DIAGNOSIS — Z7985 Long-term (current) use of injectable non-insulin antidiabetic drugs: Secondary | ICD-10-CM | POA: Diagnosis not present

## 2024-06-13 DIAGNOSIS — Z6830 Body mass index (BMI) 30.0-30.9, adult: Secondary | ICD-10-CM | POA: Diagnosis not present

## 2024-06-13 DIAGNOSIS — S6992XA Unspecified injury of left wrist, hand and finger(s), initial encounter: Secondary | ICD-10-CM | POA: Diagnosis not present

## 2024-06-13 DIAGNOSIS — E1169 Type 2 diabetes mellitus with other specified complication: Secondary | ICD-10-CM | POA: Diagnosis not present

## 2024-06-13 MED ORDER — TIRZEPATIDE 10 MG/0.5ML ~~LOC~~ SOAJ: 10.0000 mg | SUBCUTANEOUS | 0 refills | Status: AC

## 2024-06-13 NOTE — Progress Notes (Unsigned)
 WEIGHT SUMMARY AND BIOMETRICS  Vitals Temp: 98.1 F (36.7 C) BP: 116/68 Pulse Rate: 78 SpO2: 98 %   Anthropometric Measurements Height: 5' 3 (1.6 m) Weight: 172 lb (78 kg) BMI (Calculated): 30.48 Weight at Last Visit: 170 lb Weight Lost Since Last Visit: 0 Weight Gained Since Last Visit: 2 lb Starting Weight: 227 lb Total Weight Loss (lbs): 55 lb (24.9 kg) Peak Weight: 250 lb   Body Composition  Body Fat %: 44.4 % Fat Mass (lbs): 76.6 lbs Muscle Mass (lbs): 91 lbs Total Body Water (lbs): 74.2 lbs Visceral Fat Rating : 13   Other Clinical Data Fasting: no Labs: no Today's Visit #: 75 Starting Date: 03/17/20    Chief Complaint:   OBESITY [place] is here to discuss her progress with her obesity treatment plan.  She is on the the Category 1 Plan and states she is following her eating plan approximately 65 % of the time.  She states she is exercising Cardiac Rehab 45 minutes 3 times per week.  Interim History:  She suffered a significant fall last evening. Her husband witnessed her trip on stairs in their garage. She struck her Hunger/appetite-*** Cravings- *** Stress- *** Sleep- *** Exercise-*** Hydration-***  Subjective:   1. Fall, subsequent encounter ***  2. Type 2 diabetes mellitus with other specified complication, with long-term current use of insulin  (HCC) ***  3. Coronary artery disease involving native coronary artery of native heart with other form of angina pectoris   Assessment/Plan:   1. Fall, subsequent encounter ***  2. Type 2 diabetes mellitus with other specified complication, with long-term current use of insulin  (HCC) (Primary) *** - tirzepatide  (MOUNJARO ) 10 MG/0.5ML Pen; Inject 10 mg into the skin once a week.  Dispense: 6 mL; Refill: 0  3. Coronary artery disease involving native coronary artery of native heart with other form of angina pectoris ***  4. BMI 30.0-30.9,adult -- Current BMI 30.5 ***  [place] {CHL  AMB IS/IS NOT:210130109} currently in the action stage of change. As such, her goal is to {MWMwtloss#1:210800005}. She has agreed to {MWMwtlossportion/plan2:23431}.   Exercise goals: {MWM EXERCISE RECS:23473}  Behavioral modification strategies: {MWMwtlossdietstrategies3:23432}.  [place] has agreed to follow-up with our clinic in {NUMBER 1-10:22536} weeks. She was informed of the importance of frequent follow-up visits to maximize her success with intensive lifestyle modifications for her multiple health conditions.   ***delete paragraph if no labs orderedLynda was informed we would discuss her lab results at her next visit unless there is a critical issue that needs to be addressed sooner. [place] agreed to keep her next visit at the agreed upon time to discuss these results.  Objective:   Blood pressure 116/68, pulse 78, temperature 98.1 F (36.7 C), height 5' 3 (1.6 m), weight 172 lb (78 kg), SpO2 98%. Body mass index is 30.47 kg/m.  General: Cooperative, alert, well developed, in no acute distress. HEENT: Conjunctivae and lids unremarkable. Cardiovascular: Regular rhythm.  Lungs: Normal work of breathing. Neurologic: No focal deficits.   Lab Results  Component Value Date   CREATININE 0.92 01/16/2024   BUN 26 (H) 01/16/2024   NA 140 01/16/2024   K 3.9 01/16/2024   CL 109 01/16/2024   CO2 22 01/16/2024   Lab Results  Component Value Date   ALT 27 01/16/2024   AST 22 01/16/2024   ALKPHOS 71 01/16/2024   BILITOT 0.6 01/16/2024   Lab Results  Component Value Date   HGBA1C 5.2 03/14/2024   HGBA1C 5.6  01/10/2024   HGBA1C 5.5 09/14/2023   HGBA1C 5.5 06/19/2023   HGBA1C 5.5 03/14/2023   No results found for: INSULIN  Lab Results  Component Value Date   TSH 2.490 01/10/2024   Lab Results  Component Value Date   CHOL 130 01/16/2024   HDL 52 01/16/2024   LDLCALC 64 01/16/2024   TRIG 68 01/16/2024   CHOLHDL 2.5 01/16/2024   Lab Results  Component Value Date   VD25OH  54.7 01/10/2024   VD25OH 61.9 06/19/2023   VD25OH 77.1 12/19/2022   Lab Results  Component Value Date   WBC 7.0 01/16/2024   HGB 13.0 01/16/2024   HCT 38.9 01/16/2024   MCV 96.5 01/16/2024   PLT 178 01/16/2024   No results found for: IRON, TIBC, FERRITIN  Attestation Statements:   Reviewed by clinician on day of visit: allergies, medications, problem list, medical history, surgical history, family history, social history, and previous encounter notes.  Time spent on visit including pre-visit chart review and post-visit care and charting was 28 minutes.   I have reviewed the above documentation for accuracy and completeness, and I agree with the above. -  [place] d. [place] Rosello, NP-C

## 2024-06-17 DIAGNOSIS — E119 Type 2 diabetes mellitus without complications: Secondary | ICD-10-CM | POA: Diagnosis not present

## 2024-06-17 DIAGNOSIS — I2510 Atherosclerotic heart disease of native coronary artery without angina pectoris: Secondary | ICD-10-CM | POA: Diagnosis not present

## 2024-06-17 DIAGNOSIS — I252 Old myocardial infarction: Secondary | ICD-10-CM | POA: Diagnosis not present

## 2024-06-19 DIAGNOSIS — I252 Old myocardial infarction: Secondary | ICD-10-CM | POA: Diagnosis not present

## 2024-06-19 DIAGNOSIS — E119 Type 2 diabetes mellitus without complications: Secondary | ICD-10-CM | POA: Diagnosis not present

## 2024-06-19 DIAGNOSIS — I2510 Atherosclerotic heart disease of native coronary artery without angina pectoris: Secondary | ICD-10-CM | POA: Diagnosis not present

## 2024-06-21 DIAGNOSIS — I252 Old myocardial infarction: Secondary | ICD-10-CM | POA: Diagnosis not present

## 2024-06-21 DIAGNOSIS — E119 Type 2 diabetes mellitus without complications: Secondary | ICD-10-CM | POA: Diagnosis not present

## 2024-06-21 DIAGNOSIS — I2510 Atherosclerotic heart disease of native coronary artery without angina pectoris: Secondary | ICD-10-CM | POA: Diagnosis not present

## 2024-07-01 DIAGNOSIS — N183 Chronic kidney disease, stage 3 unspecified: Secondary | ICD-10-CM | POA: Diagnosis not present

## 2024-07-01 DIAGNOSIS — I2510 Atherosclerotic heart disease of native coronary artery without angina pectoris: Secondary | ICD-10-CM | POA: Diagnosis not present

## 2024-07-01 DIAGNOSIS — E782 Mixed hyperlipidemia: Secondary | ICD-10-CM | POA: Diagnosis not present

## 2024-07-01 DIAGNOSIS — G4733 Obstructive sleep apnea (adult) (pediatric): Secondary | ICD-10-CM | POA: Diagnosis not present

## 2024-07-01 DIAGNOSIS — E1122 Type 2 diabetes mellitus with diabetic chronic kidney disease: Secondary | ICD-10-CM | POA: Diagnosis not present

## 2024-07-01 DIAGNOSIS — Z0000 Encounter for general adult medical examination without abnormal findings: Secondary | ICD-10-CM | POA: Diagnosis not present

## 2024-07-01 DIAGNOSIS — I129 Hypertensive chronic kidney disease with stage 1 through stage 4 chronic kidney disease, or unspecified chronic kidney disease: Secondary | ICD-10-CM | POA: Diagnosis not present

## 2024-07-01 DIAGNOSIS — M8580 Other specified disorders of bone density and structure, unspecified site: Secondary | ICD-10-CM | POA: Diagnosis not present

## 2024-07-01 DIAGNOSIS — I209 Angina pectoris, unspecified: Secondary | ICD-10-CM | POA: Diagnosis not present

## 2024-07-01 LAB — LAB REPORT - SCANNED
A1c: 5.8
EGFR: 60

## 2024-07-02 ENCOUNTER — Other Ambulatory Visit (HOSPITAL_BASED_OUTPATIENT_CLINIC_OR_DEPARTMENT_OTHER): Payer: Self-pay | Admitting: Family Medicine

## 2024-07-02 DIAGNOSIS — Z780 Asymptomatic menopausal state: Secondary | ICD-10-CM

## 2024-07-09 ENCOUNTER — Ambulatory Visit (INDEPENDENT_AMBULATORY_CARE_PROVIDER_SITE_OTHER): Admitting: Adult Health

## 2024-07-09 VITALS — BP 127/71 | HR 105 | Temp 98.1°F | Ht 63.0 in | Wt 175.0 lb

## 2024-07-09 DIAGNOSIS — E669 Obesity, unspecified: Secondary | ICD-10-CM | POA: Diagnosis not present

## 2024-07-09 DIAGNOSIS — Z794 Long term (current) use of insulin: Secondary | ICD-10-CM | POA: Diagnosis not present

## 2024-07-09 DIAGNOSIS — W19XXXD Unspecified fall, subsequent encounter: Secondary | ICD-10-CM | POA: Diagnosis not present

## 2024-07-09 DIAGNOSIS — R632 Polyphagia: Secondary | ICD-10-CM

## 2024-07-09 DIAGNOSIS — E1169 Type 2 diabetes mellitus with other specified complication: Secondary | ICD-10-CM

## 2024-07-09 DIAGNOSIS — I25118 Atherosclerotic heart disease of native coronary artery with other forms of angina pectoris: Secondary | ICD-10-CM | POA: Diagnosis not present

## 2024-07-09 DIAGNOSIS — Z6831 Body mass index (BMI) 31.0-31.9, adult: Secondary | ICD-10-CM | POA: Diagnosis not present

## 2024-07-09 DIAGNOSIS — Z6830 Body mass index (BMI) 30.0-30.9, adult: Secondary | ICD-10-CM

## 2024-07-09 DIAGNOSIS — Z7985 Long-term (current) use of injectable non-insulin antidiabetic drugs: Secondary | ICD-10-CM | POA: Diagnosis not present

## 2024-07-09 NOTE — Progress Notes (Addendum)
 "    WEIGHT SUMMARY AND BIOMETRICS  Vitals Temp: 98.1 F (36.7 C) BP: 127/71 Pulse Rate: (!) 105 SpO2: 91 %   Anthropometric Measurements Height: 5' 3 (1.6 m) Weight: 175 lb (79.4 kg) BMI (Calculated): 31.01 Weight at Last Visit: 172 lb Weight Lost Since Last Visit: 0 Weight Gained Since Last Visit: 3 lb Starting Weight: 227 lb Total Weight Loss (lbs): 52 lb (23.6 kg) Peak Weight: 250 lb   Body Composition  Body Fat %: 45.7 % Fat Mass (lbs): 80.2 lbs Muscle Mass (lbs): 90.6 lbs Visceral Fat Rating : 13   Other Clinical Data Fasting: no Labs: no Today's Visit #: 9 Starting Date: 03/17/20    Chief Complaint:   OBESITY [place] is here to discuss her progress with her obesity treatment plan.  She is on the the Category 1 Plan and states she is following her eating plan approximately 60 % of the time.  She states she is exercising Cardiac Physical Therapy 45 minutes 3 times per week.  Interim History:  Lab Results  Component Value Date   HGBA1C 5.2 03/14/2024   HGBA1C 5.6 01/10/2024   HGBA1C 5.5 09/14/2023  Most recent A1c 5.8 on 07/01/2024   She is currently on weekly Mounjaro  10mg  She has not been checking home CBG She endorses increased cravings and subsequent snacking on simple CHO foods:ie, pumpkin cake, cinnamon rolls She would like to increase Mounjaro  dosage- discussed at length the risks/benefits of increasing potent GIP/GLP-1 therapy.  HWW and Endocrinology both advised against increasing Mounjaro  therapy as her A1c has been trending down the last year.  Subjective:   1. Type 2 diabetes mellitus with other specified complication, with long-term current use of insulin  (HCC) She is currently on weekly Mounjaro  10mg  She has not been checking home CBG She endorses increased cravings and subsequent snacking on simple CHO foods:ie, pumpkin cake, cinnamon rolls She would like to increase Mounjaro  dosage- discussed at length the risks/benefits of  increasing potent GIP/GLP-1 therapy.  HWW and Endocrinology both advised against increasing Mounjaro  therapy as her A1c has been trending down the last year.  Also reviewed her current healthy status:   07/09/24  Muscle Mass (lbs) 90.6 lbs  Fat Mass (lbs) 80.2 lbs  Visceral Fat Rating  13    07/09/24  Weight 175 lb (79.4 kg)  BMI (Calculated) 31.01   Lab Results  Component Value Date   HGBA1C 5.2 03/14/2024   HGBA1C 5.6 01/10/2024   HGBA1C 5.5 09/14/2023   Most recent A1c 5.8 on 07/01/2024   2. Fall, subsequent encounter She continues to recovery from witnesses fall on 06/12/2024 She denies HA or change in vision She denies memory deficits  3. Coronary artery disease involving native coronary artery of native heart with other form of angina pectoris 05/02/2024 Cardiology OV Notes History of Present Illness: .   [place] is a 73 y.o. female with a hx of arthritis, CAD, CKD stage IIIa, diabetes type 2,  hyperlipidemia, hypertension, obesity, and OSA on CPAP who is seen for follow-up. She was previously a patient of Dr. Sheena.      Prior clinical ASCVD: CAD s/p PCI 2022. Admitted 12/2023 for NSTEMI, no obstructive disease on cath. Echo 03/2024 unremarkable. Comorbid conditions: CKD stage IIIa, diabetes type 2, hyperlipidemia, hypertension, metabolic syndrome   Today: Overall doing well. A little fatigue, occasional swelling in her ankles, nothing major. Enjoying cardiac rehab, no issues with it. Blood pressures have been well controlled. No major shortness of  breath.    ROS: Denies shortness of breath with normal exertion. No PND, orthopnea, or unexpected weight gain. No syncope or palpitations. ROS otherwise negative except as noted.   4. Polyphagia She is currently on weekly Mounjaro  10mg  She has not been checking home CBG She endorses increased cravings and subsequent snacking on simple CHO foods:ie, pumpkin cake, cinnamon rolls She would like to increase Mounjaro   dosage- discussed at length the risks/benefits of increasing potent GIP/GLP-1 therapy. HWW and Endocrinology both advised against A1c trending down the last year.  Assessment/Plan:   1. Type 2 diabetes mellitus with other specified complication, with long-term current use of insulin  (HCC) Remain on weekly Mounjaro  10mg  Discuss dose increase with established Endocrinologist/Dr. Vianne at chronic f/u early 2026  2. Fall, subsequent encounter Monitor for worsening sx's  3. Coronary artery disease involving native coronary artery of native heart with other form of angina pectoris Continue healthy eating and Cardiac PT F/u with established cardiac care team as directed.  4. Polyphagia Strive for at least 30g protein per meal. Choose higher protein snacks: Greek yogurt  Cottage cheese Hard-boiled eggs String cheese  5. BMI 30.0-30.9,adult -- Current BMI 31.1 (Primary)  [name] is currently in the action stage of change. As such, her goal is to maintain weight for now. She has agreed to the Category 1 Plan.  Increase protein at meals and when choosing snacks. Eliminate simple CHO/sugary foods from home and try to only enjoy when eating out/celebrations for holiday season.  Exercise goals: Older adults should follow the adult guidelines. When older adults cannot meet the adult guidelines, they should be as physically active as their abilities and conditions will allow.  Older adults should do exercises that maintain or improve balance if they are at risk of falling.  Older adults should determine their level of effort for physical activity relative to their level of fitness.  Older adults with chronic conditions should understand whether and how their conditions affect their ability to do regular physical activity safely. Cardiac Physical Therapy (PT).  Behavioral modification strategies: increasing lean protein intake, decreasing simple carbohydrates, increasing vegetables, increasing water  intake, meal planning and cooking strategies, keeping healthy foods in the home, ways to avoid boredom eating, ways to avoid night time snacking, and planning for success.  [place] has agreed to follow-up with our clinic in 4 weeks. She was informed of the importance of frequent follow-up visits to maximize her success with intensive lifestyle modifications for her multiple health conditions.   Objective:   Blood pressure 127/71, pulse (!) 105, temperature 98.1 F (36.7 C), height 5' 3 (1.6 m), weight 175 lb (79.4 kg), SpO2 91%. Body mass index is 31 kg/m.  General: Cooperative, alert, well developed, in no acute distress. HEENT: Conjunctivae and lids unremarkable. Cardiovascular: Regular rhythm.  Lungs: Normal work of breathing. Neurologic: No focal deficits.   Lab Results  Component Value Date   CREATININE 0.92 01/16/2024   BUN 26 (H) 01/16/2024   NA 140 01/16/2024   K 3.9 01/16/2024   CL 109 01/16/2024   CO2 22 01/16/2024   Lab Results  Component Value Date   ALT 27 01/16/2024   AST 22 01/16/2024   ALKPHOS 71 01/16/2024   BILITOT 0.6 01/16/2024   Lab Results  Component Value Date   HGBA1C 5.2 03/14/2024   HGBA1C 5.6 01/10/2024   HGBA1C 5.5 09/14/2023   HGBA1C 5.5 06/19/2023   HGBA1C 5.5 03/14/2023   No results found for: INSULIN  Lab Results  Component Value Date   TSH 2.490 01/10/2024   Lab Results  Component Value Date   CHOL 130 01/16/2024   HDL 52 01/16/2024   LDLCALC 64 01/16/2024   TRIG 68 01/16/2024   CHOLHDL 2.5 01/16/2024   Lab Results  Component Value Date   VD25OH 54.7 01/10/2024   VD25OH 61.9 06/19/2023   VD25OH 77.1 12/19/2022   Lab Results  Component Value Date   WBC 7.0 01/16/2024   HGB 13.0 01/16/2024   HCT 38.9 01/16/2024   MCV 96.5 01/16/2024   PLT 178 01/16/2024   No results found for: IRON, TIBC, FERRITIN  Attestation Statements:   Reviewed by clinician on day of visit: allergies, medications, problem list, medical  history, surgical history, family history, social history, and previous encounter notes.  Time spent on visit including pre-visit chart review and post-visit care and charting was 26 minutes.   I have reviewed the above documentation for accuracy and completeness, and I agree with the above. -  Kamorie Aldous d. Brendaly Townsel, NP-C "

## 2024-07-18 ENCOUNTER — Telehealth: Payer: Self-pay | Admitting: Cardiology

## 2024-07-18 NOTE — Telephone Encounter (Signed)
°*  STAT* If patient is at the pharmacy, call can be transferred to refill team.   1. Which medications need to be refilled? (please list name of each medication and dose if known)   carvedilol  (COREG ) 3.125 MG tablet     4. Which pharmacy/location (including street and city if local pharmacy) is medication to be sent to?  [id number] - [place], [place] - [address] AT [address]     5. Do they need a 30 day or 90 day supply? 90

## 2024-08-05 ENCOUNTER — Telehealth (HOSPITAL_BASED_OUTPATIENT_CLINIC_OR_DEPARTMENT_OTHER): Payer: Self-pay

## 2024-08-05 NOTE — Telephone Encounter (Signed)
"  ° °  Pre-operative Risk Assessment    Patient Name: [place]  DOB: 16-Mar-1951 MRN: [id number]   Date of last office visit: 05/02/24 with Dr. Sallye Date of next office visit: NA  Request for Surgical Clearance    Procedure:  Colonoscopy  Date of Surgery:  Clearance 08/27/24                                  Surgeon:  Dr. Dianna Surgeon's Group or Practice Name:  [facility]  Phone number:  [phone] Fax number:  [phone]   Type of Clearance Requested:   - Medical  - Pharmacy:  Hold Clopidogrel  (Plavix ) 5 days prior and aspirin  not indicated   Type of Anesthesia:  propofol    Additional requests/questions:    [place]   08/05/2024, 2:31 PM   "

## 2024-08-05 NOTE — Telephone Encounter (Signed)
 Left message to call back to schedule a tele pre op appt.  ?

## 2024-08-05 NOTE — Telephone Encounter (Signed)
" ° °  Name: [place]  DOB: 1950-11-09  MRN: [id number]  Primary Cardiologist: Shelda Bruckner, MD   Preoperative team, please contact this patient and set up a phone call appointment for further preoperative risk assessment. Please obtain consent and complete medication review. Thank you for your help.  I confirm that guidance regarding antiplatelet and oral anticoagulation therapy has been completed and, if necessary, noted below.  Per office protocol, if patient is without any new symptoms or concerns at the time of their virtual visit, she may hold Plavix  for 5 days and aspirin  for 7 days  days prior to procedure. Please resume Plavix  and Aspirin  as soon as possible postprocedure, at the discretion of the surgeon.    I also confirmed the patient resides in the state of [place] . As per [facility] Medical Board telemedicine laws, the patient must reside in the state in which the provider is licensed.   Lamarr Satterfield, NP 08/05/2024, 2:35 PM [facility]    "

## 2024-08-06 ENCOUNTER — Telehealth (HOSPITAL_BASED_OUTPATIENT_CLINIC_OR_DEPARTMENT_OTHER): Payer: Self-pay | Admitting: *Deleted

## 2024-08-06 NOTE — Telephone Encounter (Signed)
 Pt scheduled tele preop appt 08/15/24. Med rec and consent are done.

## 2024-08-06 NOTE — Telephone Encounter (Signed)
 Pt returning call from yesterday regarding Tele visit. Please advise

## 2024-08-06 NOTE — Telephone Encounter (Signed)
 Pt scheduled tele preop appt 08/15/24. Med rec and consent are done.      Patient Consent for Virtual Visit        Pamela Love has provided verbal consent on 08/06/2024 for a virtual visit (video or telephone).   CONSENT FOR VIRTUAL VISIT FOR:  Pamela Love  By participating in this virtual visit I agree to the following:  I hereby voluntarily request, consent and authorize [facility] and its employed or contracted physicians, physician assistants, nurse practitioners or other licensed health care professionals (the Practitioner), to provide me with telemedicine health care services (the Services) as deemed necessary by the treating Practitioner. I acknowledge and consent to receive the Services by the Practitioner via telemedicine. I understand that the telemedicine visit will involve communicating with the Practitioner through live audiovisual communication technology and the disclosure of certain medical information by electronic transmission. I acknowledge that I have been given the opportunity to request an in-person assessment or other available alternative prior to the telemedicine visit and am voluntarily participating in the telemedicine visit.  I understand that I have the right to withhold or withdraw my consent to the use of telemedicine in the course of my care at any time, without affecting my right to future care or treatment, and that the Practitioner or I may terminate the telemedicine visit at any time. I understand that I have the right to inspect all information obtained and/or recorded in the course of the telemedicine visit and may receive copies of available information for a reasonable fee.  I understand that some of the potential risks of receiving the Services via telemedicine include:  Delay or interruption in medical evaluation due to technological equipment failure or disruption; Information transmitted may not be sufficient (e.g. poor  resolution of images) to allow for appropriate medical decision making by the Practitioner; and/or  In rare instances, security protocols could fail, causing a breach of personal health information.  Furthermore, I acknowledge that it is my responsibility to provide information about my medical history, conditions and care that is complete and accurate to the best of my ability. I acknowledge that Practitioner's advice, recommendations, and/or decision may be based on factors not within their control, such as incomplete or inaccurate data provided by me or distortions of diagnostic images or specimens that may result from electronic transmissions. I understand that the practice of medicine is not an exact science and that Practitioner makes no warranties or guarantees regarding treatment outcomes. I acknowledge that a copy of this consent can be made available to me via my patient portal [facility] MyChart), or I can request a printed copy by calling the office of [facility].    I understand that my insurance will be billed for this visit.   I have read or had this consent read to me. I understand the contents of this consent, which adequately explains the benefits and risks of the Services being provided via telemedicine.  I have been provided ample opportunity to ask questions regarding this consent and the Services and have had my questions answered to my satisfaction. I give my informed consent for the services to be provided through the use of telemedicine in my medical care

## 2024-08-07 ENCOUNTER — Ambulatory Visit (INDEPENDENT_AMBULATORY_CARE_PROVIDER_SITE_OTHER): Admitting: Adult Health

## 2024-08-07 ENCOUNTER — Encounter (INDEPENDENT_AMBULATORY_CARE_PROVIDER_SITE_OTHER): Payer: Self-pay | Admitting: Adult Health

## 2024-08-07 VITALS — BP 132/67 | HR 79 | Temp 97.7°F | Ht 63.0 in | Wt 178.0 lb

## 2024-08-07 DIAGNOSIS — Z6831 Body mass index (BMI) 31.0-31.9, adult: Secondary | ICD-10-CM | POA: Diagnosis not present

## 2024-08-07 DIAGNOSIS — E1159 Type 2 diabetes mellitus with other circulatory complications: Secondary | ICD-10-CM | POA: Diagnosis not present

## 2024-08-07 DIAGNOSIS — E871 Hypo-osmolality and hyponatremia: Secondary | ICD-10-CM | POA: Diagnosis not present

## 2024-08-07 DIAGNOSIS — E1169 Type 2 diabetes mellitus with other specified complication: Secondary | ICD-10-CM

## 2024-08-07 DIAGNOSIS — Z6830 Body mass index (BMI) 30.0-30.9, adult: Secondary | ICD-10-CM

## 2024-08-07 DIAGNOSIS — I152 Hypertension secondary to endocrine disorders: Secondary | ICD-10-CM

## 2024-08-07 DIAGNOSIS — I10 Essential (primary) hypertension: Secondary | ICD-10-CM

## 2024-08-07 DIAGNOSIS — Z794 Long term (current) use of insulin: Secondary | ICD-10-CM | POA: Diagnosis not present

## 2024-08-07 NOTE — Progress Notes (Signed)
 "    WEIGHT SUMMARY AND BIOMETRICS  Vitals Temp: 97.7 F (36.5 C) BP: 132/67 Pulse Rate: 79 SpO2: 95 %   Anthropometric Measurements Height: 5' 3 (1.6 m) Weight: 178 lb (80.7 kg) BMI (Calculated): 31.54 Weight at Last Visit: 175 lb Weight Lost Since Last Visit: 0 Weight Gained Since Last Visit: 3 lb Starting Weight: 227 lb Total Weight Loss (lbs): 49 lb (22.2 kg) Peak Weight: 250 lb   Body Composition  Body Fat %: 46.5 % Fat Mass (lbs): 83.2 lbs Muscle Mass (lbs): 90.8 lbs Visceral Fat Rating : 14   Other Clinical Data Fasting: no Labs: no Today's Visit #: 50 Starting Date: 03/17/20    Chief Complaint:   OBESITY Pamela Love is here to discuss her progress with her obesity treatment plan.  She is on the the Category 1 Plan and states she is following her eating plan approximately 60 % of the time.  She states she is exercising Cardiovascular Exercise/Gym: bike and treadmill 45 minutes 2-3 times per week.  Interim History:  She will [facility].  Over the holidays, she reports increased cravings for sweets and subsequent snacking on candy,nuts, cake, and cookies.  [name] provided the following food recall that is typical of a day: Breakfast: Yogurt, fresh fruit, 1/2 [employer] Shake Lunch: Protein with crackers OR pimiento cheese with crackers OR protein bar Dinner: meat protein with vegetables and small serving simple CHO  Subjective:   1. Hyponatremia Per pt, PCP recently checked metabolic panel with low Na+ ??? 07/05/2024 Sodium 135 136-145 mmol/L   [name] reports some mild confusion (finding names of people) immediately after her fall with head injury. She denies current confusion of dizziness  She would like her Na+ level rechecked today  2. Hypertension associated with type 2 diabetes mellitus (HCC) BP stable at OV She denies CP with exertion She is on  3. Type 2 diabetes mellitus with other specified  complication, with long-term current use of insulin  (HCC) Hemoglobin A1c Reviewed date:07/05/2024 03:09:41 PM Interpretation: Performing Lab: Notes/Report: Testing Performed at: [facility], [address], [place], [place] [address]  eAG 120      Hgb A1c 5.8 4.8-5.6 %    Over the holidays, she reports increased cravings for sweets and subsequent snacking on candy,nuts, cake, and cookies. Home fasting CBG the last several days: [phone] Home PP CBG: 95, 97  She is on weekly Mounjaro  10mg  Denies mass in neck, dysphagia, dyspepsia, persistent hoarseness, abdominal pain, or N/V/C  She would like to increase Mounjaro  to reduce hunger level and cravings. Again, discussed the risks of increasing a potent GIP/GLP- 1 medication with her A1c trending down and her that her A1c well under goal for her age. Will not increase dose today, instead strive for at least 30g protein per meal  Assessment/Plan:   1. Hyponatremia Check Labs - Comprehensive metabolic panel with GFR MyChart pt with results Forward results to established PCP/Dr. Loreli  2. Hypertension associated with type 2 diabetes mellitus (HCC) Limit Na+ Resume regular exercise  3. Type 2 diabetes mellitus with other specified complication, with long-term current use of insulin  (HCC) (Primary) Discuss Mounjaro  dosage with established Endocrinologist at next f/u Increase lean protein at meals and snacks Continue to check home CBG  4. BMI 30.0-30.9,adult -- Current BMI 31.7  [name] is currently in the action stage of change. As such, her goal is to continue with weight loss efforts. She has agreed  to the Category 1 Plan.   Exercise goals: Older adults should follow the adult guidelines. When older adults cannot meet the adult guidelines, they should be as physically active as their abilities and conditions will allow.  Older adults should do exercises that maintain or improve balance if they are at risk of falling.   Older adults should determine their level of effort for physical activity relative to their level of fitness.  Older adults with chronic conditions should understand whether and how their conditions affect their ability to do regular physical activity safely.  Behavioral modification strategies: increasing lean protein intake, decreasing simple carbohydrates, increasing vegetables, increasing water intake, no skipping meals, meal planning and cooking strategies, keeping healthy foods in the home, ways to avoid boredom eating, better snacking choices, emotional eating strategies, holiday eating strategies , planning for success, and decreasing junk food.  [name] has agreed to follow-up with our clinic in 4 weeks. She was informed of the importance of frequent follow-up visits to maximize her success with intensive lifestyle modifications for her multiple health conditions.   [name] was informed we would discuss her lab results at her next visit unless there is a critical issue that needs to be addressed sooner. [name] agreed to keep her next visit at the agreed upon time to discuss these results.  Objective:   Blood pressure 132/67, pulse 79, temperature 97.7 F (36.5 C), height 5' 3 (1.6 m), weight 178 lb (80.7 kg), SpO2 95%. Body mass index is 31.53 kg/m.  General: Cooperative, alert, well developed, in no acute distress. HEENT: Conjunctivae and lids unremarkable. Cardiovascular: Regular rhythm.  Lungs: Normal work of breathing. Neurologic: No focal deficits.   Lab Results  Component Value Date   CREATININE 0.92 01/16/2024   BUN 26 (H) 01/16/2024   NA 140 01/16/2024   K 3.9 01/16/2024   CL 109 01/16/2024   CO2 22 01/16/2024   Lab Results  Component Value Date   ALT 27 01/16/2024   AST 22 01/16/2024   ALKPHOS 71 01/16/2024   BILITOT 0.6 01/16/2024   Lab Results  Component Value Date   HGBA1C 5.2 03/14/2024   HGBA1C 5.6 01/10/2024   HGBA1C 5.5 09/14/2023   HGBA1C 5.5 06/19/2023    HGBA1C 5.5 03/14/2023   No results found for: INSULIN  Lab Results  Component Value Date   TSH 2.490 01/10/2024   Lab Results  Component Value Date   CHOL 130 01/16/2024   HDL 52 01/16/2024   LDLCALC 64 01/16/2024   TRIG 68 01/16/2024   CHOLHDL 2.5 01/16/2024   Lab Results  Component Value Date   VD25OH 54.7 01/10/2024   VD25OH 61.9 06/19/2023   VD25OH 77.1 12/19/2022   Lab Results  Component Value Date   WBC 7.0 01/16/2024   HGB 13.0 01/16/2024   HCT 38.9 01/16/2024   MCV 96.5 01/16/2024   PLT 178 01/16/2024   No results found for: IRON, TIBC, FERRITIN  Attestation Statements:   Reviewed by clinician on day of visit: allergies, medications, problem list, medical history, surgical history, family history, social history, and previous encounter notes.  I have reviewed the above documentation for accuracy and completeness, and I agree with the above. -  Candiss Galeana d. Jayce Kainz, NP-C "

## 2024-08-08 ENCOUNTER — Encounter (INDEPENDENT_AMBULATORY_CARE_PROVIDER_SITE_OTHER): Payer: Self-pay | Admitting: Adult Health

## 2024-08-08 LAB — COMPREHENSIVE METABOLIC PANEL WITH GFR
ALT: 29 IU/L (ref 0–32)
AST: 23 IU/L (ref 0–40)
Albumin: 4.3 g/dL (ref 3.8–4.8)
Alkaline Phosphatase: 124 IU/L (ref 49–135)
BUN/Creatinine Ratio: 47 — ABNORMAL HIGH (ref 12–28)
BUN: 41 mg/dL — ABNORMAL HIGH (ref 8–27)
Bilirubin Total: 0.3 mg/dL (ref 0.0–1.2)
CO2: 22 mmol/L (ref 20–29)
Calcium: 9.7 mg/dL (ref 8.7–10.3)
Chloride: 104 mmol/L (ref 96–106)
Creatinine, Ser: 0.87 mg/dL (ref 0.57–1.00)
Globulin, Total: 1.8 g/dL (ref 1.5–4.5)
Glucose: 106 mg/dL — ABNORMAL HIGH (ref 70–99)
Potassium: 4.3 mmol/L (ref 3.5–5.2)
Sodium: 139 mmol/L (ref 134–144)
Total Protein: 6.1 g/dL (ref 6.0–8.5)
eGFR: 69 mL/min/1.73

## 2024-08-12 ENCOUNTER — Ambulatory Visit (HOSPITAL_BASED_OUTPATIENT_CLINIC_OR_DEPARTMENT_OTHER)
Admission: RE | Admit: 2024-08-12 | Discharge: 2024-08-12 | Disposition: A | Source: Ambulatory Visit | Attending: Family Medicine | Admitting: Family Medicine

## 2024-08-12 DIAGNOSIS — Z780 Asymptomatic menopausal state: Secondary | ICD-10-CM | POA: Diagnosis not present

## 2024-08-12 DIAGNOSIS — M8580 Other specified disorders of bone density and structure, unspecified site: Secondary | ICD-10-CM | POA: Diagnosis not present

## 2024-08-15 ENCOUNTER — Ambulatory Visit: Attending: Cardiology | Admitting: Emergency Medicine

## 2024-08-15 DIAGNOSIS — Z01810 Encounter for preprocedural cardiovascular examination: Secondary | ICD-10-CM | POA: Diagnosis not present

## 2024-08-15 NOTE — Progress Notes (Signed)
 "   Virtual Visit via Telephone Note   Because of Pamela Love co-morbid illnesses, she is at least at moderate risk for complications without adequate follow up.  This format is felt to be most appropriate for this patient at this time.  Due to technical limitations with video connection web designer), today's appointment will be conducted as an audio only telehealth visit, and [name] verbally agreed to proceed in this manner.   All issues noted in this document were discussed and addressed.  No physical exam could be performed with this format.  Evaluation Performed:  Preoperative cardiovascular risk assessment _____________   Date:  08/15/2024   Patient ID:  Pamela Love, DOB May 25, 1951, MRN [id number] Patient Location:  Home Provider location:   Office  Primary Care Provider:  Loreli Kins, MD Primary Cardiologist:  Shelda Bruckner, MD  Chief Complaint / Patient Profile   74 y.o. y/o female with a h/o arthritis, coronary artery disease, CKD stage IIIa, type 2 diabetes, hyperlipidemia, hypertension, obesity, obstructive sleep apnea on CPAP who is pending colonoscopy in 08/27/2024 with [facility] gastroenterology and presents today for telephonic preoperative cardiovascular risk assessment.  History of Present Illness    Pamela Love is a 74 y.o. female who presents via audio/video conferencing for a telehealth visit today.  Pt was last seen in cardiology clinic on 05/02/2024 by Dr. Bruckner.  At that time Andersyn Fragoso was doing well.  The patient is now pending procedure as outlined above. Since her last visit, she is doing well without acute cardiovascular concerns or complaints.  She remains active going to the [facility] gym 2-3 times a week without any exertional symptoms.  She also dances every Friday night with her husband and without symptoms concerning for angina.  She denies chest pains, dyspnea, syncope.  She remains  asymptomatic and is able to complete greater than 4 METS.  Past Medical History    Past Medical History:  Diagnosis Date   Abnormal nuclear stress test 01/02/2020   Arthritis    Bilateral leg edema 01/02/2020   Bursitis    hips   C. difficile colitis 12/2017   CAD (coronary artery disease) 01/06/2020   CKD (chronic kidney disease), stage III (HCC)    Complication of anesthesia    spinal did not work with second c section   Diabetes mellitus (HCC) 11/19/2015   Diabetes mellitus, type 2 (HCC)    diet controlled - has Rx for Glymipride but has not started taking yet   Dyslipidemia 10/21/2014   GERD (gastroesophageal reflux disease)    History of nonmelanoma skin cancer    History of skin cancer    Hypercholesterolemia 12/11/2019   Hyperlipidemia    Hypertension    Hypertensive disorder 10/21/2014   Lower extremity edema    Mixed hyperlipidemia 01/02/2020   OAB (overactive bladder)    Obesity (BMI 30-39.9) 05/12/2020   OSA (obstructive sleep apnea) 05/12/2020   Osteoarthritis of right knee 05/15/2015   Osteoarthrosis, unspecified whether generalized or localized, involving lower leg 10/21/2014   Osteoporosis    Over weight    Primary osteoarthritis of left knee 12/18/2014   S/P knee replacement 12/18/2014   S/P total knee replacement using cement 05/15/2015   Sleep apnea    Tachycardia 12/11/2019   Past Surgical History:  Procedure Laterality Date   ABDOMINAL HYSTERECTOMY  2005   CATARACT EXTRACTION     CESAREAN SECTION     x2   colonscopy  CORONARY PRESSURE/FFR STUDY N/A 01/06/2020   Procedure: INTRAVASCULAR PRESSURE WIRE/FFR STUDY;  Surgeon: Court Dorn PARAS, MD;  Location: [facility] INVASIVE CV LAB;  Service: Cardiovascular;  Laterality: N/A;   CORONARY PRESSURE/FFR WITH 3D MAPPING N/A 01/16/2024   Procedure: Coronary Pressure/FFR w/3D Mapping;  Surgeon: Jordan, Peter M, MD;  Location: [facility] INVASIVE CV LAB;  Service: Cardiovascular;  Laterality: N/A;   CORONARY STENT INTERVENTION N/A  01/06/2020   Procedure: CORONARY STENT INTERVENTION;  Surgeon: Court Dorn PARAS, MD;  Location: [facility] INVASIVE CV LAB;  Service: Cardiovascular;  Laterality: N/A;   EYE SURGERY     bilat cataract surgery 2015   LEFT HEART CATH AND CORONARY ANGIOGRAPHY N/A 01/06/2020   Procedure: LEFT HEART CATH AND CORONARY ANGIOGRAPHY;  Surgeon: Court Dorn PARAS, MD;  Location: [facility] INVASIVE CV LAB;  Service: Cardiovascular;  Laterality: N/A;   LEFT HEART CATH AND CORONARY ANGIOGRAPHY N/A 01/29/2021   Procedure: LEFT HEART CATH AND CORONARY ANGIOGRAPHY;  Surgeon: Dann Candyce RAMAN, MD;  Location: [facility] INVASIVE CV LAB;  Service: Cardiovascular;  Laterality: N/A;   LEFT HEART CATH AND CORONARY ANGIOGRAPHY N/A 01/16/2024   Procedure: LEFT HEART CATH AND CORONARY ANGIOGRAPHY;  Surgeon: Jordan, Peter M, MD;  Location: [facility] INVASIVE CV LAB;  Service: Cardiovascular;  Laterality: N/A;   TOTAL KNEE ARTHROPLASTY Left 12/18/2014   Procedure: TOTAL LEFT KNEE ARTHROPLASTY;  Surgeon: Lamar Collet, MD;  Location: [facility] ORS;  Service: Orthopedics;  Laterality: Left;   TOTAL KNEE ARTHROPLASTY Right 05/15/2015   Procedure: RIGHT TOTAL KNEE ARTHROPLASTY;  Surgeon: Lamar Collet, MD;  Location: [facility] ORS;  Service: Orthopedics;  Laterality: Right;   UTERINE FIBROID SURGERY      Allergies  Allergies[1]  Home Medications    Prior to Admission medications  Medication Sig Start Date End Date Taking? Authorizing Provider  Accu-Chek Softclix Lancets lancets USE TO CHECK BLOOD SUGAR TWICE DAILY 01/01/21   Kassie Mallick, MD  acetaminophen  (TYLENOL ) 650 MG CR tablet Take 1,300 mg by mouth at bedtime.    [provider]  aspirin  EC 81 MG tablet Take 1 tablet (81 mg total) by mouth in the morning. 01/29/21   Dann Candyce RAMAN, MD  Blood Glucose Monitoring Suppl (ACCU-CHEK GUIDE ME) w/Device KIT 1 each by Does not apply route 2 (two) times daily. E11.9 12/23/20   Kassie Mallick, MD  carvedilol  (COREG ) 3.125 MG tablet Take 1 tablet (3.125 mg  total) by mouth 2 (two) times daily with a meal. 01/30/24   Lonni Slain, MD  Cholecalciferol (VITAMIN D ) 50 MCG (2000 UT) CAPS Take 1 capsule (2,000 Units total) by mouth every morning. 03/02/23   Opalski, Barnie, DO  clopidogrel  (PLAVIX ) 75 MG tablet TAKE 1 TABLET BY MOUTH EVERY DAY WITH BREAKFAST 05/31/24   Lonni Slain, MD  Coenzyme Q10 (COQ10) 200 MG CAPS Take 200 mg by mouth in the morning.    [provider]  DULoxetine  (CYMBALTA ) 20 MG capsule Take 40 mg by mouth at bedtime.     [provider]  glucose blood (ACCU-CHEK GUIDE) test strip Use to check BS 2x a day 01/20/22   Trixie File, MD  levocetirizine (XYZAL ) 5 MG tablet Take 1 tablet (5 mg total) by mouth every evening. 06/05/23   Midge Barnie, DO  Multiple Vitamin (MULTIVITAMIN WITH MINERALS) TABS tablet Take 1 tablet by mouth every morning.     [provider]  nitroGLYCERIN  (NITROSTAT ) 0.4 MG SL tablet PLACE 1 TABLET UNDER THE TONGUE EVERY 5 MINUTES X 3 DOSES AS NEEDED  FOR CHEST PAIN. Patient taking differently: 0.4 mg every 5 (five) minutes as needed for chest pain. TAKES PRN 02/09/24   Lonni Slain, MD  Omega-3 Fatty Acids (RA FISH OIL) 1400 MG CPDR Take 1,400 mg by mouth 2 (two) times daily.    [provider]  pantoprazole  (PROTONIX ) 40 MG tablet Take 40 mg by mouth daily.    [provider]  ranolazine  (RANEXA ) 1000 MG SR tablet TAKE 1 TABLET BY MOUTH TWICE A DAY 05/03/24   Lonni Slain, MD  rosuvastatin  (CRESTOR ) 40 MG tablet TAKE 1 TABLET (40 MG TOTAL) BY MOUTH IN THE MORNING 09/11/23   Lonni Slain, MD  saccharomyces boulardii (FLORASTOR) 250 MG capsule Take 250 mg by mouth in the morning.    [provider]  tirzepatide  (MOUNJARO ) 10 MG/0.5ML Pen Inject 10 mg into the skin once a week. 06/13/24   Danford, Katy D, NP  trospium (SANCTURA) 20 MG tablet Take 20 mg by mouth 2 (two) times daily. 09/25/14   [provider]  vitamin C (ASCORBIC ACID) 250 MG tablet Take 250 mg by mouth in the morning.    [provider]    Physical Exam    Vital Signs:  Dametria Tuzzolino does not have vital signs available for review today.  Given telephonic nature of communication, physical exam is limited. AAOx3. NAD. Normal affect.  Speech and respirations are unlabored.  Accessory Clinical Findings    None  Assessment & Plan    1.  Preoperative Cardiovascular Risk Assessment: According to the Revised Cardiac Risk Index (RCRI), her Perioperative Risk of Major Cardiac Event is (%): 0.9. Her Functional Capacity in METs is: 5.81 according to the Duke Activity Status Index (DASI).  Therefore, based on ACC/AHA guidelines, patient would be at acceptable risk for the planned procedure without further cardiovascular testing. I will route this recommendation to the requesting party via Epic fax function.  The patient was advised that if she develops new symptoms prior to surgery to contact our office to arrange for a follow-up visit, and she verbalized understanding.  She may hold Plavix  for 5 days prior to procedure. Please resume Plavix  as soon as possible postprocedure, at the discretion of the surgeon.   Ideally aspirin  should be continued without interruption, however if the bleeding risk is too great, aspirin  may be held for 5-7 days prior to surgery. Please resume aspirin  post operatively when it is felt to be safe from a bleeding standpoint.     A copy of this note will be routed to requesting surgeon.  Time:   Today, I have spent 6 minutes with the patient with telehealth technology discussing medical history, symptoms, and management plan.     Lum LITTIE Louis, NP  08/15/2024, 2:08 PM     [1]  Allergies Allergen Reactions   Amoxicillin-Pot Clavulanate     Other reaction(s): vomiting   Aspirin      Other reaction(s): reflux and nervousness   Colchicine     Stomach cramps Other  reaction(s): severe GI upset   Diclofenac     Oral causes stomach cramps   Dulaglutide Diarrhea and Nausea And Vomiting    Other reaction(s): diarrhea   Empagliflozin      Other reaction(s): yeast infection   Ezetimibe     Muscle pain Other reaction(s): muscle aches   Imdur  [Isosorbide  Nitrate] Other (See Comments)    hypotensive   Isosorbide  Other (See Comments)    Causes Hypotension   Metformin   Other reaction(s): stomach upset   Repaglinide      Other reaction(s): swelling and GI upset   Sitagliptin  Phos-Metformin  Hcl Nausea And Vomiting    Lightheaded  Other reaction(s): GI upset   Sulfamethoxazole-Trimethoprim Itching    Other reaction(s): itching   Imipramine Rash   Tape Rash    Paper tape ok to use    "

## 2024-08-30 ENCOUNTER — Other Ambulatory Visit (HOSPITAL_BASED_OUTPATIENT_CLINIC_OR_DEPARTMENT_OTHER): Payer: Self-pay | Admitting: Cardiology

## 2024-08-30 DIAGNOSIS — I25118 Atherosclerotic heart disease of native coronary artery with other forms of angina pectoris: Secondary | ICD-10-CM

## 2024-08-30 DIAGNOSIS — E782 Mixed hyperlipidemia: Secondary | ICD-10-CM

## 2024-09-04 ENCOUNTER — Ambulatory Visit (INDEPENDENT_AMBULATORY_CARE_PROVIDER_SITE_OTHER): Admitting: Adult Health

## 2024-09-16 ENCOUNTER — Ambulatory Visit: Admitting: Internal Medicine

## 2024-09-18 ENCOUNTER — Ambulatory Visit: Admitting: Internal Medicine

## 2024-09-19 ENCOUNTER — Ambulatory Visit (INDEPENDENT_AMBULATORY_CARE_PROVIDER_SITE_OTHER): Admitting: Adult Health

## 2024-12-02 ENCOUNTER — Ambulatory Visit (HOSPITAL_BASED_OUTPATIENT_CLINIC_OR_DEPARTMENT_OTHER): Admitting: Cardiology
